# Patient Record
Sex: Male | Born: 1935 | Race: White | Hispanic: No | Marital: Married | Smoking: Former smoker
Health system: Southern US, Community
[De-identification: ages and names within clinical notes are randomized; demographics above are authoritative.]

## PROBLEM LIST (undated history)

## (undated) DIAGNOSIS — I872 Venous insufficiency (chronic) (peripheral): Secondary | ICD-10-CM

## (undated) DIAGNOSIS — N10 Acute tubulo-interstitial nephritis: Principal | ICD-10-CM

## (undated) DIAGNOSIS — R972 Elevated prostate specific antigen [PSA]: Secondary | ICD-10-CM

## (undated) DIAGNOSIS — Z7901 Long term (current) use of anticoagulants: Secondary | ICD-10-CM

## (undated) DIAGNOSIS — E669 Obesity, unspecified: Secondary | ICD-10-CM

## (undated) DIAGNOSIS — I429 Cardiomyopathy, unspecified: Secondary | ICD-10-CM

## (undated) DIAGNOSIS — I82409 Acute embolism and thrombosis of unspecified deep veins of unspecified lower extremity: Secondary | ICD-10-CM

## (undated) DIAGNOSIS — B9620 Unspecified Escherichia coli [E. coli] as the cause of diseases classified elsewhere: Secondary | ICD-10-CM

## (undated) DIAGNOSIS — N400 Enlarged prostate without lower urinary tract symptoms: Secondary | ICD-10-CM

## (undated) DIAGNOSIS — W19XXXA Unspecified fall, initial encounter: Secondary | ICD-10-CM

## (undated) DIAGNOSIS — F0390 Unspecified dementia without behavioral disturbance: Secondary | ICD-10-CM

## (undated) DIAGNOSIS — F17200 Nicotine dependence, unspecified, uncomplicated: Secondary | ICD-10-CM

## (undated) DIAGNOSIS — R519 Headache, unspecified: Secondary | ICD-10-CM

## (undated) DIAGNOSIS — R4189 Other symptoms and signs involving cognitive functions and awareness: Secondary | ICD-10-CM

## (undated) DIAGNOSIS — J9801 Acute bronchospasm: Secondary | ICD-10-CM

## (undated) DIAGNOSIS — I714 Abdominal aortic aneurysm, without rupture, unspecified: Secondary | ICD-10-CM

## (undated) DIAGNOSIS — N200 Calculus of kidney: Secondary | ICD-10-CM

## (undated) DIAGNOSIS — J449 Chronic obstructive pulmonary disease, unspecified: Secondary | ICD-10-CM

## (undated) DIAGNOSIS — I4891 Unspecified atrial fibrillation: Secondary | ICD-10-CM

## (undated) DIAGNOSIS — Y92009 Unspecified place in unspecified non-institutional (private) residence as the place of occurrence of the external cause: Secondary | ICD-10-CM

## (undated) DIAGNOSIS — M1990 Unspecified osteoarthritis, unspecified site: Secondary | ICD-10-CM

## (undated) DIAGNOSIS — N210 Calculus in bladder: Secondary | ICD-10-CM

## (undated) DIAGNOSIS — R51 Headache: Secondary | ICD-10-CM

## (undated) DIAGNOSIS — L0390 Cellulitis, unspecified: Secondary | ICD-10-CM

## (undated) DIAGNOSIS — Z5181 Encounter for therapeutic drug level monitoring: Secondary | ICD-10-CM

## (undated) DIAGNOSIS — R609 Edema, unspecified: Secondary | ICD-10-CM

## (undated) DIAGNOSIS — H409 Unspecified glaucoma: Secondary | ICD-10-CM

## (undated) DIAGNOSIS — IMO0002 Reserved for concepts with insufficient information to code with codable children: Secondary | ICD-10-CM

## (undated) DIAGNOSIS — I509 Heart failure, unspecified: Secondary | ICD-10-CM

## (undated) DIAGNOSIS — R531 Weakness: Secondary | ICD-10-CM

## (undated) DIAGNOSIS — I739 Peripheral vascular disease, unspecified: Secondary | ICD-10-CM

## (undated) DIAGNOSIS — N401 Enlarged prostate with lower urinary tract symptoms: Secondary | ICD-10-CM

## (undated) DIAGNOSIS — F068 Other specified mental disorders due to known physiological condition: Secondary | ICD-10-CM

## (undated) DIAGNOSIS — N390 Urinary tract infection, site not specified: Secondary | ICD-10-CM

## (undated) DIAGNOSIS — E538 Deficiency of other specified B group vitamins: Secondary | ICD-10-CM

## (undated) HISTORY — PX: CATARACT EXTRACTION W/ INTRAOCULAR LENS  IMPLANT, BILATERAL: SHX1307

## (undated) HISTORY — DX: Cardiomyopathy, unspecified: I42.9

## (undated) HISTORY — DX: Acute embolism and thrombosis of unspecified deep veins of unspecified lower extremity: I82.409

## (undated) HISTORY — DX: Nicotine dependence, unspecified, uncomplicated: F17.200

## (undated) HISTORY — DX: Unspecified place in unspecified non-institutional (private) residence as the place of occurrence of the external cause: Y92.009

## (undated) HISTORY — PX: SPINE SURGERY: SHX786

## (undated) HISTORY — DX: Abdominal aortic aneurysm, without rupture: I71.4

## (undated) HISTORY — DX: Benign prostatic hyperplasia without lower urinary tract symptoms: N40.0

## (undated) HISTORY — DX: Deficiency of other specified B group vitamins: E53.8

## (undated) HISTORY — DX: Heart failure, unspecified: I50.9

## (undated) HISTORY — DX: Venous insufficiency (chronic) (peripheral): I87.2

## (undated) HISTORY — DX: Acute bronchospasm: J98.01

## (undated) HISTORY — DX: Acute pyelonephritis: N10

## (undated) HISTORY — DX: Abdominal aortic aneurysm, without rupture, unspecified: I71.40

## (undated) HISTORY — PX: BACK SURGERY: SHX140

## (undated) HISTORY — DX: Obesity, unspecified: E66.9

## (undated) HISTORY — DX: Elevated prostate specific antigen (PSA): R97.20

## (undated) HISTORY — DX: Other specified mental disorders due to known physiological condition: F06.8

## (undated) HISTORY — DX: Reserved for concepts with insufficient information to code with codable children: IMO0002

## (undated) HISTORY — DX: Edema, unspecified: R60.9

## (undated) HISTORY — DX: Chronic obstructive pulmonary disease, unspecified: J44.9

## (undated) HISTORY — DX: Long term (current) use of anticoagulants: Z79.01

## (undated) HISTORY — DX: Unspecified atrial fibrillation: I48.91

## (undated) HISTORY — DX: Encounter for therapeutic drug level monitoring: Z51.81

## (undated) HISTORY — DX: Unspecified fall, initial encounter: W19.XXXA

## (undated) HISTORY — DX: Weakness: R53.1

## (undated) HISTORY — PX: EYE SURGERY: SHX253

## (undated) HISTORY — DX: Cellulitis, unspecified: L03.90

## (undated) HISTORY — DX: Calculus in bladder: N21.0

## (undated) HISTORY — DX: Peripheral vascular disease, unspecified: I73.9

---

## 1969-01-30 HISTORY — PX: CYSTOSCOPY W/ STONE MANIPULATION: SHX1427

## 1984-06-01 HISTORY — PX: INSITU PERCUTANEOUS PINNING FEMUR: SUR727

## 1985-04-01 HISTORY — PX: FEMUR FRACTURE SURGERY: SHX633

## 1998-04-19 ENCOUNTER — Other Ambulatory Visit: Admission: RE | Admit: 1998-04-19 | Discharge: 1998-04-19 | Payer: Self-pay | Admitting: Urology

## 2000-03-09 ENCOUNTER — Encounter: Payer: Self-pay | Admitting: Family Medicine

## 2000-03-09 ENCOUNTER — Ambulatory Visit (HOSPITAL_COMMUNITY): Admission: RE | Admit: 2000-03-09 | Discharge: 2000-03-09 | Payer: Self-pay | Admitting: Family Medicine

## 2000-03-17 ENCOUNTER — Encounter: Admission: RE | Admit: 2000-03-17 | Discharge: 2000-04-06 | Payer: Self-pay | Admitting: Neurosurgery

## 2002-01-18 ENCOUNTER — Ambulatory Visit (HOSPITAL_COMMUNITY): Admission: RE | Admit: 2002-01-18 | Discharge: 2002-01-18 | Payer: Self-pay | Admitting: Neurosurgery

## 2002-01-18 ENCOUNTER — Encounter: Payer: Self-pay | Admitting: Neurosurgery

## 2002-06-01 HISTORY — PX: POSTERIOR LAMINECTOMY / DECOMPRESSION LUMBAR SPINE: SUR740

## 2002-11-17 ENCOUNTER — Encounter: Payer: Self-pay | Admitting: Neurosurgery

## 2002-11-17 ENCOUNTER — Ambulatory Visit (HOSPITAL_COMMUNITY): Admission: RE | Admit: 2002-11-17 | Discharge: 2002-11-18 | Payer: Self-pay | Admitting: Neurosurgery

## 2002-12-20 ENCOUNTER — Encounter: Admission: RE | Admit: 2002-12-20 | Discharge: 2003-01-24 | Payer: Self-pay | Admitting: Neurosurgery

## 2004-09-05 ENCOUNTER — Ambulatory Visit (HOSPITAL_COMMUNITY): Admission: RE | Admit: 2004-09-05 | Discharge: 2004-09-05 | Payer: Self-pay | Admitting: Family Medicine

## 2005-09-24 ENCOUNTER — Emergency Department (HOSPITAL_COMMUNITY): Admission: EM | Admit: 2005-09-24 | Discharge: 2005-09-24 | Payer: Self-pay | Admitting: Emergency Medicine

## 2006-11-01 ENCOUNTER — Ambulatory Visit (HOSPITAL_COMMUNITY): Admission: RE | Admit: 2006-11-01 | Discharge: 2006-11-01 | Payer: Self-pay | Admitting: Family Medicine

## 2007-05-25 ENCOUNTER — Encounter: Admission: RE | Admit: 2007-05-25 | Discharge: 2007-05-25 | Payer: Self-pay | Admitting: Urology

## 2007-06-02 HISTORY — PX: HIP FRACTURE SURGERY: SHX118

## 2007-06-24 ENCOUNTER — Ambulatory Visit: Payer: Self-pay | Admitting: Vascular Surgery

## 2008-05-28 ENCOUNTER — Inpatient Hospital Stay (HOSPITAL_COMMUNITY): Admission: EM | Admit: 2008-05-28 | Discharge: 2008-06-05 | Payer: Self-pay | Admitting: Emergency Medicine

## 2008-06-04 ENCOUNTER — Ambulatory Visit: Payer: Self-pay | Admitting: Physical Medicine & Rehabilitation

## 2008-06-05 ENCOUNTER — Inpatient Hospital Stay (HOSPITAL_COMMUNITY)
Admission: RE | Admit: 2008-06-05 | Discharge: 2008-06-15 | Payer: Self-pay | Admitting: Physical Medicine & Rehabilitation

## 2008-06-05 ENCOUNTER — Ambulatory Visit: Payer: Self-pay | Admitting: Physical Medicine & Rehabilitation

## 2008-07-27 ENCOUNTER — Ambulatory Visit: Payer: Self-pay | Admitting: Vascular Surgery

## 2008-08-13 ENCOUNTER — Encounter: Admission: RE | Admit: 2008-08-13 | Discharge: 2008-09-11 | Payer: Self-pay | Admitting: Orthopedic Surgery

## 2009-02-05 ENCOUNTER — Ambulatory Visit: Payer: Self-pay | Admitting: Gastroenterology

## 2009-02-08 ENCOUNTER — Ambulatory Visit: Payer: Self-pay | Admitting: Vascular Surgery

## 2009-02-21 ENCOUNTER — Telehealth: Payer: Self-pay | Admitting: Gastroenterology

## 2009-02-22 ENCOUNTER — Telehealth: Payer: Self-pay | Admitting: Gastroenterology

## 2009-02-25 ENCOUNTER — Encounter: Payer: Self-pay | Admitting: Gastroenterology

## 2009-02-25 ENCOUNTER — Ambulatory Visit: Payer: Self-pay | Admitting: Gastroenterology

## 2009-02-26 ENCOUNTER — Encounter: Payer: Self-pay | Admitting: Gastroenterology

## 2009-06-01 HISTORY — PX: CHOLECYSTECTOMY: SHX55

## 2009-07-12 ENCOUNTER — Ambulatory Visit: Payer: Self-pay | Admitting: Vascular Surgery

## 2009-07-19 ENCOUNTER — Ambulatory Visit: Payer: Self-pay | Admitting: Vascular Surgery

## 2009-12-07 ENCOUNTER — Inpatient Hospital Stay (HOSPITAL_COMMUNITY): Admission: EM | Admit: 2009-12-07 | Discharge: 2009-12-12 | Payer: Self-pay | Admitting: Emergency Medicine

## 2009-12-09 ENCOUNTER — Encounter (INDEPENDENT_AMBULATORY_CARE_PROVIDER_SITE_OTHER): Payer: Self-pay

## 2009-12-30 ENCOUNTER — Inpatient Hospital Stay (HOSPITAL_COMMUNITY): Admission: EM | Admit: 2009-12-30 | Discharge: 2010-01-02 | Payer: Self-pay | Admitting: Emergency Medicine

## 2009-12-31 ENCOUNTER — Encounter (INDEPENDENT_AMBULATORY_CARE_PROVIDER_SITE_OTHER): Payer: Self-pay

## 2010-02-20 ENCOUNTER — Ambulatory Visit (HOSPITAL_COMMUNITY): Admission: RE | Admit: 2010-02-20 | Discharge: 2010-02-20 | Payer: Self-pay | Admitting: Gastroenterology

## 2010-08-14 LAB — PROTIME-INR
INR: 1.06 (ref 0.00–1.49)
Prothrombin Time: 14 seconds (ref 11.6–15.2)

## 2010-08-14 LAB — COMPREHENSIVE METABOLIC PANEL
ALT: 18 U/L (ref 0–53)
Alkaline Phosphatase: 63 U/L (ref 39–117)
BUN: 13 mg/dL (ref 6–23)
CO2: 29 mEq/L (ref 19–32)
Chloride: 106 mEq/L (ref 96–112)
Creatinine, Ser: 0.86 mg/dL (ref 0.4–1.5)
GFR calc non Af Amer: >60 mL/min (ref >60)
Glucose, Bld: 95 mg/dL (ref 70–99)
Total Protein: 6.6 g/dL (ref 6.0–8.3)

## 2010-08-14 LAB — CBC
MCHC: 33.8 g/dL (ref 30.0–36.0)
Platelets: 188 10*3/uL (ref 150–400)

## 2010-08-14 LAB — DIFFERENTIAL
Eosinophils Absolute: 0.3 10*3/uL (ref 0.0–0.7)
Eosinophils Relative: 6 % — ABNORMAL HIGH (ref 0–5)
Lymphs Abs: 2.1 10*3/uL (ref 0.7–4.0)
Neutro Abs: 3 10*3/uL (ref 1.7–7.7)
Neutrophils Relative %: 47 % (ref 43–77)

## 2010-08-14 LAB — TYPE AND SCREEN: Antibody Screen: NEGATIVE

## 2010-08-15 LAB — CBC
HCT: 38.8 % — ABNORMAL LOW (ref 39.0–52.0)
HCT: 44.8 % (ref 39.0–52.0)
Hemoglobin: 12.4 g/dL — ABNORMAL LOW (ref 13.0–17.0)
Hemoglobin: 15.3 g/dL (ref 13.0–17.0)
MCH: 30 pg (ref 26.0–34.0)
MCH: 30 pg (ref 26.0–34.0)
MCH: 31 pg (ref 26.0–34.0)
MCHC: 33.5 g/dL (ref 30.0–36.0)
MCV: 89.4 fL (ref 78.0–100.0)
MCV: 90.2 fL (ref 78.0–100.0)
MCV: 90.4 fL (ref 78.0–100.0)
MCV: 90.7 fL (ref 78.0–100.0)
Platelets: 171 10*3/uL (ref 150–400)
Platelets: 188 10*3/uL (ref 150–400)
Platelets: 207 10*3/uL (ref 150–400)
RBC: 4.15 MIL/uL — ABNORMAL LOW (ref 4.22–5.81)
RDW: 14.9 % (ref 11.5–15.5)
RDW: 15.2 % (ref 11.5–15.5)
RDW: 15.5 % (ref 11.5–15.5)
WBC: 10 10*3/uL (ref 4.0–10.5)
WBC: 16.5 10*3/uL — ABNORMAL HIGH (ref 4.0–10.5)

## 2010-08-15 LAB — COMPREHENSIVE METABOLIC PANEL
ALT: 283 U/L — ABNORMAL HIGH (ref 0–53)
AST: 283 U/L — ABNORMAL HIGH (ref 0–37)
AST: 81 U/L — ABNORMAL HIGH (ref 0–37)
Albumin: 2.6 g/dL — ABNORMAL LOW (ref 3.5–5.2)
Albumin: 2.6 g/dL — ABNORMAL LOW (ref 3.5–5.2)
Albumin: 3.5 g/dL (ref 3.5–5.2)
Alkaline Phosphatase: 185 U/L — ABNORMAL HIGH (ref 39–117)
Alkaline Phosphatase: 225 U/L — ABNORMAL HIGH (ref 39–117)
BUN: 12 mg/dL (ref 6–23)
BUN: 12 mg/dL (ref 6–23)
BUN: 8 mg/dL (ref 6–23)
CO2: 26 mEq/L (ref 19–32)
CO2: 26 mEq/L (ref 19–32)
Calcium: 8.7 mg/dL (ref 8.4–10.5)
Calcium: 9.5 mg/dL (ref 8.4–10.5)
Chloride: 105 mEq/L (ref 96–112)
Chloride: 108 mEq/L (ref 96–112)
Creatinine, Ser: 0.8 mg/dL (ref 0.4–1.5)
Creatinine, Ser: 0.95 mg/dL (ref 0.4–1.5)
GFR calc Af Amer: >60 mL/min (ref >60)
GFR calc Af Amer: >60 mL/min (ref >60)
GFR calc non Af Amer: >60 mL/min (ref >60)
Glucose, Bld: 121 mg/dL — ABNORMAL HIGH (ref 70–99)
Glucose, Bld: 94 mg/dL (ref 70–99)
Potassium: 3.9 mEq/L (ref 3.5–5.1)
Potassium: 4 mEq/L (ref 3.5–5.1)
Potassium: 4.5 mEq/L (ref 3.5–5.1)
Sodium: 138 mEq/L (ref 135–145)
Total Bilirubin: 7.2 mg/dL — ABNORMAL HIGH (ref 0.3–1.2)
Total Bilirubin: 7.3 mg/dL — ABNORMAL HIGH (ref 0.3–1.2)
Total Bilirubin: 8.7 mg/dL — ABNORMAL HIGH (ref 0.3–1.2)
Total Bilirubin: 8.8 mg/dL — ABNORMAL HIGH (ref 0.3–1.2)
Total Protein: 5.7 g/dL — ABNORMAL LOW (ref 6.0–8.3)
Total Protein: 5.7 g/dL — ABNORMAL LOW (ref 6.0–8.3)
Total Protein: 7 g/dL (ref 6.0–8.3)

## 2010-08-15 LAB — URINALYSIS, ROUTINE W REFLEX MICROSCOPIC
Hgb urine dipstick: NEGATIVE
Ketones, ur: 15 mg/dL — AB
Nitrite: POSITIVE — AB
Specific Gravity, Urine: 1.022 (ref 1.005–1.030)
pH: 6 (ref 5.0–8.0)

## 2010-08-15 LAB — POCT CARDIAC MARKERS: Myoglobin, poc: 93.3 ng/mL (ref 12–200)

## 2010-08-15 LAB — LIPASE, BLOOD: Lipase: 17 U/L (ref 11–59)

## 2010-08-15 LAB — POCT I-STAT, CHEM 8
Calcium, Ion: 1.12 mmol/L (ref 1.12–1.32)
HCT: 45 % (ref 39.0–52.0)
Hemoglobin: 15.3 g/dL (ref 13.0–17.0)
Sodium: 138 mEq/L (ref 135–145)
TCO2: 22 mmol/L (ref 0–100)

## 2010-08-15 LAB — URINE MICROSCOPIC-ADD ON

## 2010-08-15 LAB — DIFFERENTIAL
Eosinophils Absolute: 0.1 10*3/uL (ref 0.0–0.7)
Monocytes Absolute: 2.4 10*3/uL — ABNORMAL HIGH (ref 0.1–1.0)

## 2010-08-17 LAB — CBC
HCT: 38.9 % — ABNORMAL LOW (ref 39.0–52.0)
HCT: 42.3 % (ref 39.0–52.0)
Hemoglobin: 12.8 g/dL — ABNORMAL LOW (ref 13.0–17.0)
Hemoglobin: 12.8 g/dL — ABNORMAL LOW (ref 13.0–17.0)
Hemoglobin: 13.4 g/dL (ref 13.0–17.0)
Hemoglobin: 14.3 g/dL (ref 13.0–17.0)
MCH: 30.8 pg (ref 26.0–34.0)
MCH: 31.1 pg (ref 26.0–34.0)
MCH: 31.4 pg (ref 26.0–34.0)
MCHC: 33 g/dL (ref 30.0–36.0)
MCHC: 33.1 g/dL (ref 30.0–36.0)
MCHC: 33.7 g/dL (ref 30.0–36.0)
MCHC: 33.8 g/dL (ref 30.0–36.0)
MCV: 92.3 fL (ref 78.0–100.0)
MCV: 92.5 fL (ref 78.0–100.0)
MCV: 93.3 fL (ref 78.0–100.0)
Platelets: 153 10*3/uL (ref 150–400)
Platelets: 194 10*3/uL (ref 150–400)
RBC: 4.06 MIL/uL — ABNORMAL LOW (ref 4.22–5.81)
RBC: 4.12 MIL/uL — ABNORMAL LOW (ref 4.22–5.81)
RBC: 4.27 MIL/uL (ref 4.22–5.81)
RDW: 15.7 % — ABNORMAL HIGH (ref 11.5–15.5)
RDW: 15.7 % — ABNORMAL HIGH (ref 11.5–15.5)

## 2010-08-17 LAB — COMPREHENSIVE METABOLIC PANEL
ALT: 201 U/L — ABNORMAL HIGH (ref 0–53)
ALT: 98 U/L — ABNORMAL HIGH (ref 0–53)
AST: 159 U/L — ABNORMAL HIGH (ref 0–37)
AST: 335 U/L — ABNORMAL HIGH (ref 0–37)
AST: 81 U/L — ABNORMAL HIGH (ref 0–37)
Albumin: 2.7 g/dL — ABNORMAL LOW (ref 3.5–5.2)
Albumin: 2.8 g/dL — ABNORMAL LOW (ref 3.5–5.2)
Alkaline Phosphatase: 143 U/L — ABNORMAL HIGH (ref 39–117)
Alkaline Phosphatase: 155 U/L — ABNORMAL HIGH (ref 39–117)
BUN: 12 mg/dL (ref 6–23)
BUN: 7 mg/dL (ref 6–23)
BUN: 9 mg/dL (ref 6–23)
BUN: 9 mg/dL (ref 6–23)
CO2: 25 mEq/L (ref 19–32)
CO2: 26 mEq/L (ref 19–32)
CO2: 28 mEq/L (ref 19–32)
CO2: 29 mEq/L (ref 19–32)
Calcium: 8.4 mg/dL (ref 8.4–10.5)
Calcium: 8.6 mg/dL (ref 8.4–10.5)
Calcium: 8.7 mg/dL (ref 8.4–10.5)
Chloride: 107 mEq/L (ref 96–112)
Chloride: 110 mEq/L (ref 96–112)
Chloride: 110 mEq/L (ref 96–112)
Creatinine, Ser: 0.84 mg/dL (ref 0.4–1.5)
Creatinine, Ser: 0.96 mg/dL (ref 0.4–1.5)
Creatinine, Ser: 1 mg/dL (ref 0.4–1.5)
GFR calc Af Amer: >60 mL/min (ref >60)
GFR calc Af Amer: >60 mL/min (ref >60)
GFR calc non Af Amer: >60 mL/min (ref >60)
GFR calc non Af Amer: >60 mL/min (ref >60)
GFR calc non Af Amer: >60 mL/min (ref >60)
Glucose, Bld: 127 mg/dL — ABNORMAL HIGH (ref 70–99)
Glucose, Bld: 130 mg/dL — ABNORMAL HIGH (ref 70–99)
Potassium: 4.4 mEq/L (ref 3.5–5.1)
Sodium: 139 mEq/L (ref 135–145)
Sodium: 141 mEq/L (ref 135–145)
Total Bilirubin: 3.2 mg/dL — ABNORMAL HIGH (ref 0.3–1.2)
Total Bilirubin: 4.3 mg/dL — ABNORMAL HIGH (ref 0.3–1.2)
Total Protein: 5.5 g/dL — ABNORMAL LOW (ref 6.0–8.3)
Total Protein: 5.7 g/dL — ABNORMAL LOW (ref 6.0–8.3)
Total Protein: 5.7 g/dL — ABNORMAL LOW (ref 6.0–8.3)
Total Protein: 5.9 g/dL — ABNORMAL LOW (ref 6.0–8.3)

## 2010-08-17 LAB — DIFFERENTIAL
Basophils Relative: 0 % (ref 0–1)
Lymphocytes Relative: 12 % (ref 12–46)
Monocytes Absolute: 0.6 10*3/uL (ref 0.1–1.0)
Monocytes Relative: 7 % (ref 3–12)
Neutro Abs: 6.8 10*3/uL (ref 1.7–7.7)

## 2010-08-17 LAB — TYPE AND SCREEN
ABO/RH(D): O POS
Antibody Screen: NEGATIVE

## 2010-08-17 LAB — HEPATIC FUNCTION PANEL
ALT: 16 U/L (ref 0–53)
AST: 24 U/L (ref 0–37)
Bilirubin, Direct: 0.2 mg/dL (ref 0.0–0.3)
Total Bilirubin: 0.6 mg/dL (ref 0.3–1.2)

## 2010-08-17 LAB — URINALYSIS, ROUTINE W REFLEX MICROSCOPIC
Bilirubin Urine: NEGATIVE
Glucose, UA: NEGATIVE mg/dL
Hgb urine dipstick: NEGATIVE
Ketones, ur: NEGATIVE mg/dL
pH: 8.5 — ABNORMAL HIGH (ref 5.0–8.0)

## 2010-08-17 LAB — BASIC METABOLIC PANEL
Chloride: 106 mEq/L (ref 96–112)
GFR calc non Af Amer: >60 mL/min (ref >60)
Glucose, Bld: 128 mg/dL — ABNORMAL HIGH (ref 70–99)
Potassium: 4.3 mEq/L (ref 3.5–5.1)
Sodium: 140 mEq/L (ref 135–145)

## 2010-09-15 LAB — CBC
HCT: 32.3 % — ABNORMAL LOW (ref 39.0–52.0)
Hemoglobin: 11 g/dL — ABNORMAL LOW (ref 13.0–17.0)
MCHC: 33.6 g/dL (ref 30.0–36.0)
MCHC: 33.9 g/dL (ref 30.0–36.0)
MCV: 92.2 fL (ref 78.0–100.0)
Platelets: 369 10*3/uL (ref 150–400)
RBC: 3.35 MIL/uL — ABNORMAL LOW (ref 4.22–5.81)
RBC: 3.29 MIL/uL — ABNORMAL LOW (ref 4.22–5.81)
RBC: 3.52 MIL/uL — ABNORMAL LOW (ref 4.22–5.81)
WBC: 7.3 10*3/uL (ref 4.0–10.5)
WBC: 8.3 10*3/uL (ref 4.0–10.5)

## 2010-09-15 LAB — URINALYSIS, ROUTINE W REFLEX MICROSCOPIC
Bilirubin Urine: NEGATIVE
Glucose, UA: NEGATIVE mg/dL
Hgb urine dipstick: NEGATIVE
Ketones, ur: NEGATIVE mg/dL
Protein, ur: NEGATIVE mg/dL
pH: 6 (ref 5.0–8.0)

## 2010-09-15 LAB — URINE CULTURE: Colony Count: 9000

## 2010-09-15 LAB — BASIC METABOLIC PANEL
CO2: 25 mEq/L (ref 19–32)
Calcium: 8.7 mg/dL (ref 8.4–10.5)
Chloride: 108 mEq/L (ref 96–112)
GFR calc Af Amer: >60 mL/min (ref >60)
Potassium: 4 mEq/L (ref 3.5–5.1)
Sodium: 141 mEq/L (ref 135–145)

## 2010-09-15 LAB — DIFFERENTIAL
Eosinophils Absolute: 0.6 10*3/uL (ref 0.0–0.7)
Eosinophils Relative: 8 % — ABNORMAL HIGH (ref 0–5)
Lymphocytes Relative: 18 % (ref 12–46)
Lymphs Abs: 1.3 10*3/uL (ref 0.7–4.0)
Monocytes Absolute: 1 10*3/uL (ref 0.1–1.0)

## 2010-09-15 LAB — COMPREHENSIVE METABOLIC PANEL
ALT: 48 U/L (ref 0–53)
AST: 44 U/L — ABNORMAL HIGH (ref 0–37)
Albumin: 2.5 g/dL — ABNORMAL LOW (ref 3.5–5.2)
CO2: 26 mEq/L (ref 19–32)
Calcium: 8.7 mg/dL (ref 8.4–10.5)
Chloride: 109 mEq/L (ref 96–112)
Creatinine, Ser: 0.91 mg/dL (ref 0.4–1.5)
GFR calc Af Amer: >60 mL/min (ref >60)
Sodium: 140 mEq/L (ref 135–145)
Total Bilirubin: 0.8 mg/dL (ref 0.3–1.2)

## 2010-10-14 NOTE — Consult Note (Signed)
[name], [name]                  ACCOUNT NO.:  [id number]   MEDICAL RECORD NO.:  [id number]          PATIENT TYPE:  INP   LOCATION:  0111                         FACILITY:  [facility]   PHYSICIAN:  Courtney Paris, M.D.DATE OF BIRTH:  [birth year]   DATE OF CONSULTATION:  05/28/2008  DATE OF DISCHARGE:                                 CONSULTATION   REASON FOR CONSULTATION:  Inability to pass Foley catheter, called by  emergency room physician.   BRIEF HISTORY:  This 77 year old patient who has previously seen Dr.  Annabell Howells had a fractured right hip today.  He had a mechanical fall.  He  had previously injured and broken his right femur around 1986.  The  catheter was originally placed by the emergency room personnel but did  not drain, later removed after about 3 hours.  He did have some gross  hematuria then was unable to void.  He has been on doxazosin and  finasteride, sees Dr. Annabell Howells, last saw him in October.  He previously had  a biopsy of the prostate by Dr. Vonita Moss.  He has never been in urinary  retention before.   OTHER MEDICAL PROBLEMS:  1. He has an abdominal aortic aneurysm that is 4.7 cm.  2. He does have some coronary artery disease.  3. Hypertension.  4. Diabetes mellitus.  5. He does have some mild asthma.   PREVIOUS SURGERIES:  Include:  1. Back surgery x2.  2. His broken femur years ago.   HE HAS NO ALLERGIES.   He is on doxazosin and the finasteride as above.   VITAL SIGNS:  Stable.  His blood pressure 132/68.  Heart rate 64.  Respirations 16.  He is a pleasant elderly white male laying in bed in  some mild distress from his broken hip.  HEENT:  Clear.  NECK:  Supple.  ABDOMEN:  Soft.  Mildly obese.  Bladder is not distended.  PENIS:  Normal, circumcised.  Adequate meatus.  Bilaterally descended  testes.  Prostate hard to feel due to his fractured hip and not  examined.  EXTREMITIES:  He has little lateral rotation of his hip from his recent  hip  fracture.  Good distal pulses and agitation to light touch.   A #16-Foley catheter was passed with lidocaine jelly into the bladder  without difficulty.  He did have light pink urine with about 400 mL.  Catheter was left to straight drainage with 10 mL in the balloon.   IMPRESSION:  1. Urethral trauma secondary to malplacement of Foley catheter.  2. Recent fracture, right hip.  3. Obstructive uropathy on maximal medical therapy.   RECOMMEND:  Leave Foley catheter until he is fully ambulatory.  Continue  doxazosin and finasteride.  You might want to hold doxazosin pre and  postop per the preop evaluation but continue finasteride when he is  ambulatory then discontinue the Foley for a voiding trial.  He had been  voiding well prior to this and should do so afterwards.  If he has  difficulty, please call Dr. Annabell Howells who is the patient's urologist  for any  problems.      Courtney Paris, M.D.  Electronically Signed     HMK/MEDQ  D:  05/28/2008  T:  05/29/2008  Job:  [id number]

## 2010-10-14 NOTE — Op Note (Signed)
[name], [name]                  ACCOUNT NO.:  [id number]   MEDICAL RECORD NO.:  [id number]          PATIENT TYPE:  INP   LOCATION:  1609                         FACILITY:  [facility]   PHYSICIAN:  John L. Rendall, M.D.  DATE OF BIRTH:  [birth year]   DATE OF PROCEDURE:  05/29/2008  DATE OF DISCHARGE:                               OPERATIVE REPORT   PREOPERATIVE DIAGNOSIS:  Two-part intertrochanteric fracture, right hip.   SURGICAL PROCEDURE:  Dynamic compression screw fixation, right hip  fracture, TK2.   POSTOPERATIVE DIAGNOSIS:  Two-part intertrochanteric fracture, right  hip.   SURGEON:  John L. Rendall, M.D.   ASSISTANT:  Legrand Pitts. Duffy, PA-C.   ANESTHESIA:  General.   PATHOLOGY:  Two-part intertroc fracture, right hip, with minimal  displacement secondary to a fall.   PROCEDURE:  Under general anesthetic, the patient was placed on the  fracture table and his hip was prepared with DuraPrep and draped as a  sterile field.  He is 300 pounds at 6 feet 8 and quite a large fellow.  After 2 g of Ancef and usual time-out procedure, a lateral skin incision  was made beginning just below the greater trochanter.  Dissection was  carried down through skin and subcutaneous tissue, splitting the IT band  and the vastus lateralis vertically.  Multiple bleeding vessels were  encountered in the subcutaneous tissue and these tended to be quite  large.  Electrocautery was freely used.  At the cortex a guidewire was  placed up the femoral neck into the femoral head at an approximate 135-  degree angle.  After several adjustments, it was beautifully centered in  the inferior one third of the femoral head on both the AP and lateral  view, pretty much in the midline on the lateral view.  With this in  place, it was measured to be 110 mm deep.  A 4-hole 135-degree side  plate was measured to see how close it would be to the plate and screws  in the distal two-thirds of the femur, and there was an  approximate 3-  inch gap that would be present.  This was felt acceptable, and it should  be noted that the proximal screw on the 4-hole plate came out into this  intertroc fracture.  The 110-mm screw was placed into the femoral neck  and head and the 4-hole plate attached to the side of the shaft with a  [place] claw retractor.  With this in place, 4 screws were placed  laterally.  Excellent bite was obtained in these screws, all except for  the first one which was only a unicortical screw.  At this point with  the plate in place and in excellent position on 2 views, traction was  let off, fracture was compressed and is literally seen to compress  approximately 5-6 mm.  This apparatus stayed in place properly.  With  this then complete, final x-  rays were obtained.  The wound was irrigated and then closed in layers  with #1 Vicryl, 0 Vicryl, 2-0 Vicryl and skin clips.  Operative  time  approximately 1 hour and 10 minutes.  The patient tolerated the  procedure well.  Blood loss estimate 750 mL, none given.  He returned to  recovery in good condition.      John L. Rendall, M.D.  Electronically Signed     JLR/MEDQ  D:  05/29/2008  T:  05/30/2008  Job:  [id number]

## 2010-10-14 NOTE — Assessment & Plan Note (Signed)
OFFICE VISIT   [name], [name]  DOB:  [birth year]                                       07/19/2009  QIHKV#:[id number]   The patient is Mays 77 year old Caucasian male who is here today regarding  concern of right leg pain and bilateral lower extremity edema.  He has  known chronic venous insufficiency as well as Mays small infrarenal  abdominal aortic aneurysm which Dr. Tawanna Cooler Early has been following.  He  apparently had an appointment at our office last week in which he  underwent abdominal ultrasound but did not wish to see Mays [name] at  that time and therefore rescheduled his visit for today.  He is here  today mainly for concern of his legs.  He has had no abdominal pain or  back pain in regards to his aneurysm.  In regards to his leg pain he  says that his right shin hurts when he walks and this has been going on  for about 1 year.  He reports that many of his symptoms in his legs  started after undergoing physical therapy following his right hip  fracture in December of 2009.  He said while in therapy it was  determined that Mays right leg plate which had been placed in 1986  following Mays fracture was also broken and now feels that his right leg is  crooked.  He has been seen for this and apparently was told that  surgery to repair and remove the plate would be too risky at such Mays far  out date from his initial surgery.  He had had Mays right venous ulcer  which was being treated by Dr. Elana Alm, his primary physician.  He was  using an Radio broadcast assistant. That venous ulcer has since healed.  Because of his  marked lower extremity swelling bilaterally Dr. Arbie Cookey did recommend  compression garments but the patient said these were too difficult for  him to wear and instead temporarily used some Ace wraps which provided  some relief.  Dr. Elana Alm also has since placed him on diuretic therapy.  He had not had as much of Mays problem with his left leg until last year  and is somewhat  concerned about this as well.  He has had no open sores  although did develop an eschar on his left leg.  He reports both lower  legs are very scaly and one of these came off while bathing and he later  developed this eschar.  It has not been draining.  He has not had any  fever.  He does have classic brawny edema on his lower legs and says his  leg discoloration has been chronic.  He does have Mays history of prior  right leg DVT and pulmonary embolism after his leg fracture in 1986 but  is no longer on Coumadin.  He denies any calf pain.   He underwent vascular lab studies at our office which showed biphasic  and triphasic Doppler waveforms bilaterally of his posterior tib and  dorsalis pedis arteries.  ABIs were greater than 1.0 bilaterally.   PHYSICAL EXAM:  Vital signs:  Findings show Mays blood pressure of 138/75  in his left arm, heart rate 66, respirations 20.  General:  This is Mays  tall gentleman, pleasant, in no acute distress.  HEENT:  Head is  normocephalic, atraumatic.  No carotid bruits were auscultated.  Lung:  Sounds are clear throughout.  Heart:  Had Mays regular rate and rhythm.  He  does have Mays 2/6 systolic ejection murmur heard best over his right  sternal border.  Abdomen:  Is soft and nontender.  I was unable to  palpate his abdominal aortic aneurysm.  Lower extremities:  Show 1+  femoral pulses bilaterally.  Doppler signals as discussed above.  He has  around 2+ edema bilateral lower extremities.  Lower extremities are  thick in appearance.  He has scaling of both lower extremities with the  scaling on his right shin somewhat tan in color.  He has some calluses  on his feet but no open sores.  He has the left mid lower leg eschar  approximately 1-2 cm in diameter which does not have any significant  erythema or drainage.   I reviewed these findings with Dr. Arbie Cookey.  It appears that these changes  are consistent with chronic venous insufficiency of bilateral lower   extremities which he has had.  I am recommending continued elevation as  able and to restart Ace wraps for relief.  Currently he is on furosemide  10 mg daily.  He can continue followup with Dr. Elana Alm regarding  diuretic therapy.  In regards to his aneurysm I did review the results  from his ultrasound on 07/12/2009.  The aneurysm measured 3.19 x 3.24 cm  compared to February of 2010 of 3.32 x 3.87 cm.  We have recommended 6  month followup regarding his aortic aneurysm.  In regards to the chronic  venous insufficiency continue with the plan as stated above.  He can  follow up as needed if develops any venous stasis ulcers.   Jerold Coombe, PA   Larina Earthly, M.D.  Electronically Signed   AWZ/MEDQ  D:  07/19/2009  T:  07/20/2009  Job:  [id number]   cc:   S. Kyra Manges, M.D.  Ernestina Penna, M.D.

## 2010-10-14 NOTE — Discharge Summary (Signed)
NAMESAAHAS, HIDROGO                  ACCOUNT NO.:  [id number]   MEDICAL RECORD NO.:  [id number]          PATIENT TYPE:  IPS   LOCATION:  4008                         FACILITY:  [facility]   PHYSICIAN:  Ranelle Oyster, M.D.DATE OF BIRTH:  May 02, 1932   DATE OF ADMISSION:  06/05/2008  DATE OF DISCHARGE:  06/15/2008                               DISCHARGE SUMMARY   DISCHARGE DIAGNOSES:  1. Right intertrochanteric hip fracture, status post dynamic      compression screw on May 30, 2007.  2. Benign prostatic hypertrophy.  3. Glaucoma.  4. Pain management.  5. Subcutaneous Lovenox for deep vein thrombosis prophylaxis.   This is a 77 year old white male admitted on May 28, 2008, after he  tripped over a dog chain.  There was no report of loss of consciousness.  Sustained a right intertrochanteric hip fracture.  Underwent dynamic  compression screw fixation on May 29, 2008, per Dr. Priscille Kluver.  Arixtra for deep vein thrombosis prophylaxis was added to his regimen.  Advised weightbearing as tolerated to right lower extremity.  Postoperative mild hematuria felt to be related to Foley tube trauma  with followup per Urology Services and monitored.  Pain control with PCA  discontinued on May 31, 2008.  Bouts of constipation with stool  softener added.  The patient was admitted for comprehensive rehab  program.   PAST MEDICAL HISTORY:  See discharge diagnoses.   Remote smoker.  Occasional alcohol.   Allergies are PROPOXYPHENE and NAPSYLATE.   SOCIAL HISTORY:  Lives with his wife.  Local daughter works.  Wife can  assist but limited with lifting.  He lives in a one-level home, two  steps to entry.   FUNCTIONAL HISTORY:  Prior to admission, he was independent driving.   FUNCTIONAL STATUS:  Upon admission to rehab services, he was total  assist 40 feet with a walker, total assist transfers.   MEDICATIONS PRIOR TO ADMISSION:  Cardura 8 mg daily, Proscar are 5 mg  daily, Cosopt  ophthalmic solution at bedtime.   PHYSICAL EXAMINATION:  VITAL SIGNS:  Blood pressure 120/66, pulse 88,  respiration 17, temperature 98.5.  GENERAL:  This was an alert male in no acute distress, oriented x3.  EXTREMITIES:  Deep tendon reflexes were 2+.  Calves remained cool  without any swelling, erythema, and nontender.  Right hip incision was  clean, dry, and intact with staples.  LUNGS:  Clear to auscultation.  CARDIAC:  Regular rate and rhythm.  ABDOMEN:  Soft and nontender.  Good bowel sounds.   REHABILITATION HOSPITAL COURSE:  The patient was admitted to inpatient  rehab services with therapies initiated on a 3-hour daily basis  consisting of physical therapy, occupational therapy, and 24-hour  rehabilitation nursing.  The following issues were addressed during the  patient's rehabilitation stay.  Pertaining to Mr. Mccarney right  intertrochanteric hip fracture, he had undergone compression screw on  May 29, 2008.  He was ambulating and weightbearing as tolerated.  He would follow up with Dr. Priscille Kluver 2 weeks after discharge.  He was  using Vicodin as needed for pain with  good results.  He did have a  history of benign prostatic hypertrophy with bouts of hematuria that  improved with removal of Foley catheter tube, felt to be related to  tube.  He remained on Proscar and Cardura.  He did have some mildly  elevated bladder scans after voids of 500.  He was placed on low-dose  Urecholine and monitored.  He would follow up with Urology Services on  discharge.  He remained on his Cosopt for history of glaucoma, which was  without issue during his rehab stay.  Bouts of constipation with  laxative assistance.  He did receive a bottle of magnesium citrate on  June 14, 2008.  The patient initially on Arixtra for deep vein  thrombosis prophylaxis.  Upon his rehab course, he completed this  regimen and Lovenox was added to his mobility improved.   Weekly collaborative  interdisciplinary team conferences were held to  discuss the patient's estimated length of stay, family teaching, and any  barriers to discharge.  He was currently modified independent,  wheelchair on the unit, supervision for transfers bed to chair, minimal  assist for ambulation on stairs, car, and bed mobility, supervision and  minimal assist overall for activities of daily living.  He exhibited no  unsafe behavior.  Full family teaching was completed.  He was discharged  to home.   Latest labs showed hemoglobin 10.4, hematocrit 30.9, and platelet  369,000.  Sodium 140, potassium 3.9, BUN 16, creatinine 0.9.   Discharge medications at the time of dictation included Cardura 4 mg at  bedtime; Proscar 5 mg daily; Cosopt 1 drop both eyes as advised; Senokot  tablets two at bedtime, hold for loose stools; Vicodin 5/325 mg one  tablet every 4 hours as needed pain, dispense of 90 tablets.   Diet was regular.  Weightbearing precautions are as tolerated.  He would  follow up with Dr. Priscille Kluver, Orthopedic Services in 1-2 weeks.  Call for  appointment Dr. Rudi Heap, medical management, Dr. Aldean Ast, Urology  Services as advised.      Mariam Dollar, P.A.      Ranelle Oyster, M.D.  Electronically Signed    DA/MEDQ  D:  06/14/2008  T:  06/15/2008  Job:  [id number]   cc:   Ernestina Penna, M.D.  John L. Rendall, M.D.  Excell Seltzer. Annabell Howells, M.D.

## 2010-10-14 NOTE — Assessment & Plan Note (Signed)
OFFICE VISIT   EMAURI, KRYGIER A  DOB:  [birth year]                                       06/24/2007  EAVWU#:[id number]   The patient presents today for evaluation of abdominal aortic aneurysm.  He had been seen in our practice before for venous pathology, and  incidental discussion of questionable aneurysm.  We had done an  ultrasound showing suggested maximal diameter of 2.7 cm.  He  subsequently has had other imaging studies, most recently an ultrasound  at [facility] on 05/25/2007 suggesting a maximal diameter of 4.7  cm.  I am seeing him for further discussion of this.   FAMILY HISTORY:  Significant for a brother with abdominal aortic  aneurysm as well.   Does not smoke, having quit 9 years ago.  He does have a history of  kidney stones.   PHYSICAL EXAM:  Well-developed, well-nourished gentleman appearing his  stated age of 40, blood pressure 149/75, pulse 73, respirations 18.  He  is quite large with a height of 6 feet 7 inches, weight 330 pounds.  Large gentleman in no acute distress.  He has 2+ radial, 2+ femoral, 2+  popliteal, and 2+ posterior tibial pulses bilaterally.  Abdominal exam  reveals moderate obesity.  I do not palpate an aneurysm.  He did have a  CT scan today, and I have compared this with CT scan of April of 2007.  He has diffuse arteriomegaly throughout his thoracic and abdominal  aorta.  Maximal diameter in the abdomen is 3.8 cm.  Maximal diameter in  the chest is approximately 3.5 cm.   I discussed this at length with the patient and his wife present.  I  explained that, with his size, that he must present some difficult with  ultrasound.  I think that we clearly underestimated the size of his  aneurysm with his ultrasound in our office November 2007 when he had a  CT scan showing a maximal diameter in April of 2007 of 3.6 cm.  Also, I  explained that the recent imaging at [facility] suggesting a 4.7  cm aneurysm,  obviously was incorrect with a CAT scan today showing 3.8  cm aneurysm.  Regardless, I explained that now he has had an 18 month  separation between the first CT and this one showing no significant  change over that time.  I explained that, with his diffuse  arteriomegaly, that we would image him again in 1 year.  I explained  that we would again attempt ultrasound, although he and I both are  somewhat concerning 2 incorrect findings thus far.  I would hate to  commit him to yearly CAT scans since I think there is very little chance  that this will develop into a more serious problem for him.  He  understands symptoms of leaking aneurysm and will notify me should this  occur.  Otherwise, we will see him in 1 year with ultrasound.   Larina Earthly, M.D.  Electronically Signed   TFE/MEDQ  D:  06/24/2007  T:  06/27/2007  Job:  [id number]   cc:   Excell Seltzer. Annabell Howells, M.D.  Ernestina Penna, M.D.

## 2010-10-14 NOTE — Consult Note (Signed)
[name], [name]                  ACCOUNT NO.:  [id number]   MEDICAL RECORD NO.:  [id number]          PATIENT TYPE:  EMS   LOCATION:  ED                           FACILITY[facility]   PHYSICIAN:  Oswald Hillock, MD        DATE OF BIRTH:  Oct 27, 1931   DATE OF CONSULTATION:  05/28/2008  DATE OF DISCHARGE:                                 CONSULTATION   REASON FOR CONSULTATION:  Preop clearance.   CHIEF COMPLAINT/HISTORY OF PRESENT ILLNESS:  The patient is a 76-year-  old Caucasian male, who presents to the emergency room status post a  mechanical fall and was noted to have a right intertrochanteric hip  fracture.  We were consulted for preop clearance given patient's  multiple medical problems.  Upon further questioning, the patient states  that he did not have any chest pain, shortness of breath, palpitations,  or dizziness prior to, during, or after the fall.  At the time of this  interview, patient denies any other symptoms.  He has some minimal  discomfort in the right hip area.  Patient does have a significant  history of an abdominal aortic aneurysm, which as per the last reports  in our records, is about 3.8 cm.  He also has history of benign  prostatic hyperplasia and denies any history of hypertension, diabetes,  coronary artery disease, or any strokes.   PAST MEDICAL HISTORY:  1. Benign prostatic hyperplasia.  2. Abdominal aortic aneurysm.  3. Glaucoma.  4. History of nephrolithiasis.  5. History of back pain.   PAST SURGICAL HISTORY:  Patient had a back surgery for severe spinal  stenosis and a large ruptured disk, L4-L5.  The procedure was a  decompressive laminectomy.   CURRENT MEDICATIONS:  Include doxazosin, finasteride, Timolol eye drops.   ALLERGIES:  No known drug allergies.   SOCIAL HISTORY:  Patient denies any recent history of alcohol, tobacco,  or drug use, lives with family and is independent in all his ADLs.   FAMILY HISTORY:  No history of coronary artery  disease or stroke or  diabetes mellitus in the family.   REVIEW OF SYSTEMS:  An extensive review of systems was done and all  systems were negative except for the positives mentioned in the History  of Present Illness.   PHYSICAL EXAMINATION:  VITAL SIGNS:  On admission, pulse of 55, blood  pressure 141/56, respiratory rate of 18, temperature 97.9.  GENERAL:  The patient is conscious, alert, oriented to time, place, and  person, in no acute distress.  HEENT:  No scleral icterus, no conjunctival congestion, no pallor, ears  negative, no significant oropharyngeal lesions.  NECK:  Supple, no lymphadenopathy, no JVD, no carotid bruit.  CHEST:  Breath sounds heard bilaterally, good air entry, and diminished  breath sounds on the left side.  CVS:  S1 is 2+, irregular, no gallop, rub, or murmur appreciated.  ABDOMEN:  Soft, obese, and nontender, bowel sounds are present, no  pulsatile masses noted in the abdominal examination.  EXTREMITIES:  No cyanosis, clubbing, or edema.  The right  lower  extremity is slightly shortened with external rotation, peripheral  pulses are present.  NEUROLOGIC:  Cranial nerves II-XII appear grossly intact.  Patient moves  all 4 extremities.   LABORATORY DATA:  Sodium 144, potassium 3.8, chloride 109, CO2 26,  glucose 102, BUN 17, creatinine 0.8.  PT 14.2, INR 1.1.  WBC count 10.8,  hemoglobin 14.8, hematocrit 43.9, platelet count of 217.  Urinalysis was  essentially negative except for a small bilirubin and a small blood, no  nitrite, and no leuk-esterase.  EKG showed normal sinus rhythm, left  anterior fascicular block, and no new changes compared with a previous  EKG.   IMPRESSION AND PLAN:  This is a case of a 77 year old Caucasian male  with a history of benign prostatic hypertrophy and glaucoma and  abdominal aortic aneurysm, who presents status post fall with a right  intertrochanteric hip fracture.   Preoperative clearance:  Patient's only major  risk factor is his age and  also the fact that he has an abdominal aortic aneurysm.  However, it has  been stable based on his most recent evaluation.  At this time, there is  no absolute contraindication to surgery as he does not have any other  major risk factors.  Patient should be able to proceed with surgery,  though we will still class him as intermediate risk given his age and  history of his abdominal aortic aneurysm.  We would recommend holding  his doxazosin preoperatively and postoperatively to prevent  postoperative hypotension, monitor his hemoglobin and hematocrit  closely, and use compression devices for deep venous thrombosis  prophylaxis.  Incentive spirometry postoperatively and close monitoring  of his cardiovascular status.   Thank you for allowing [place] to participate in the care of this patient.  Please feel free to call us with any questions.      Oswald Hillock, MD  Electronically Signed     BA/MEDQ  D:  05/28/2008  T:  05/28/2008  Job:  [id number]

## 2010-10-14 NOTE — Assessment & Plan Note (Signed)
OFFICE VISIT   [name], [name]  DOB:  [birth year]                                       07/27/2008  [id number]#:[id number]   The patient presents today for continued follow-up of his known  asymptomatic infrarenal abdominal aortic aneurysm.  Since my last visit  with him he has had Mays hip fracture in December.  He is recovering from  this but continue to walk with Mays walker.  He has no symptoms referable  to his aneurysm.  He denies any new cardiac difficulties.  He is not Mays  diabetic.  He is not hypertensive.   PHYSICAL EXAMINATION TODAY:  Blood pressure is 144/73, pulse 56, his  radial and femoral pulses are 2+.  He has 2+ popliteal pulses.  He does  have some swelling.  I do not palpate distal pulses in his feet.  Abdominal:  Reveals obesity.  I do not feel an aneurysm.   He underwent an ultrasound today and this reveals Mays maximal diameter of  3.8 cm, which is similar to that on Mays prior CT scan.  He had an aberrant  number from Mays study at [facility] suggesting Mays 4.8 cm aneurysm  several years ago.  This clearly was an error.  He was reassured with  this and we will continue to follow him on Mays yearly basis with  ultrasound.   Larina Earthly, M.D.  Electronically Signed   TFE/MEDQ  D:  07/27/2008  T:  07/30/2008  Job:  [id number]   cc:   Excell Seltzer. Annabell Howells, M.D.  Ernestina Penna, M.D.

## 2010-10-14 NOTE — Procedures (Signed)
DUPLEX ULTRASOUND OF ABDOMINAL AORTA   INDICATION:  Followup abdominal aortic aneurysm.   HISTORY:  Diabetes:  No.  Cardiac:  No.  Hypertension:  No.  Smoking:  Previous.  Connective Tissue Disorder:  Family History:  Brother has had AAA.  Previous Surgery:   DUPLEX EXAM:         AP (cm)                   TRANSVERSE (cm)  Proximal             2.81 cm                   2.83 cm  Mid                  3.19 cm                   3.24 cm  Distal               2.09 cm                   3.63 cm  Right Iliac          1.15 cm                   1.84 cm  Left Iliac           1.11 cm                   1.49 cm   PREVIOUS:  Date:  07/27/2008  AP:  3.32  TRANSVERSE:  3.87   IMPRESSION:  1. Abdominal aortic aneurysm with largest measurement of 3.19 x 3.24      cm.  2. Poor visualization due to body habitus, bowel gas pattern and      broken rib.   ___________________________________________  Larina Earthly, M.D.   CJ/MEDQ  D:  07/12/2009  T:  07/12/2009  Job:  [id number]

## 2010-10-14 NOTE — Procedures (Signed)
DUPLEX ULTRASOUND OF ABDOMINAL AORTA   INDICATION:  Follow-up abdominal aortic aneurysm.   HISTORY:  Diabetes:  No.  Cardiac:  No.  Hypertension:  No.  Smoking:  Quit about 10 years ago.  Connective Tissue Disorder:  Family History:  Previous Surgery:   DUPLEX EXAM:         AP (cm)                   TRANSVERSE (cm)  Proximal             2.78 cm                   2.81 cm  Mid                  3.32 cm                   3.87 cm  Distal               2.53 cm                   2.83 cm  Right Iliac          1.54 cm                   1.34 cm  Left Iliac           1.56 cm                   1.38 cm   PREVIOUS:  Date:  AP:  2.4  TRANSVERSE:  2.7   IMPRESSION:  Abdominal aortic aneurysm noted with the largest  measurement of (3.32 cm x 3.87 cm).   ___________________________________________  Larina Earthly, M.D.   MG/MEDQ  D:  07/27/2008  T:  07/27/2008  Job:  [id number]
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
## 2010-10-14 NOTE — Assessment & Plan Note (Signed)
OFFICE VISIT   [name], [name] A  DOB:  1932/03/26                                       02/08/2009  [id number]#:[id number]   The patient presents today for evaluation of lower extremity edema.  He  is known to me from prior follow-up of a small infrarenal abdominal  aortic aneurysm.  He has presented with increasing leg edema  bilaterally.  It used to be mainly in his right leg and has now  progressed to left leg swelling as well.  He had a right leg venous  ulcer which was appropriately treated by Dr. Elana Alm with Roland Rack boot, has  now healed.  He does have classic findings of brawny edema around his  ankles and distal calves bilaterally, this is circumferential.  He does  have marked swelling bilaterally.  He does have a history of prior right  leg DVT and pulmonary embolus after a leg fracture in 1986.  He is not  on Coumadin.  He does have 2+ dorsalis pedis pulses bilaterally.  He  underwent noninvasive vascular laboratory studies in our office today  and this reveals no evidence of DVT.  He has deep valvular incompetence  bilaterally.  I discussed this with the patient and his wife present.  I  explained the critical importance of elevation and compression.  He  reports that he is having difficulty wearing compression garments and  having difficulty fitting.  He and his wife both are unable to place  these.  I have suggested potentially Ace wraps for relief of this.  They  will also discuss potential increased diuretic treatment the next time  they see Dr. Elana Alm.  He will see me again in February or March with  ultrasound of his aneurysm, as scheduled, for follow-up of a small  aneurysm.   Larina Earthly, M.D.  Electronically Signed   TFE/MEDQ  D:  02/08/2009  T:  02/11/2009  Job:  [id number]   cc:   S. Kyra Manges, M.D.  Ernestina Penna, M.D.

## 2010-10-14 NOTE — Discharge Summary (Signed)
NAMEWINTON, [name]                  ACCOUNT NO.:  [id number]   MEDICAL RECORD NO.:  [id number]          PATIENT TYPE:  INP   LOCATION:  1609                         FACILITY:  [facility]   PHYSICIAN:  John L. Rendall, M.D.  DATE OF BIRTH:  02-11-1932   DATE OF ADMISSION:  05/28/2008  DATE OF DISCHARGE:                               DISCHARGE SUMMARY   DISCHARGE DATE:  Probable June 04, 2008.   ADMISSION DIAGNOSES:  1. Intertrochanteric right hip fracture, status post old right finger      fracture.  2. Triple abdominal aortic aneurysm.  3. Benign prostatic hypertrophy.  4. Glaucoma.   DISCHARGE DIAGNOSES:  1. Right intertrochanteric femur fracture, status post compression      screw fixation.  2. Urethral trauma due to malposition of Foley catheter.  3. Acute blood loss anemia secondary to surgery.  4. Benign prostatic hypertrophy.  5. Abdominal aortic aneurysm.  6. Glaucoma.   SURGICAL PROCEDURE:  On May 29, 2008, [name] underwent right  compression screw fixation of his intertrochanteric femur fracture by  Dr. Jonny Ruiz L. Rendall assisted by Dr. Arnoldo Morale, PAC.   COMPLICATIONS:  None.   CONSULTS:  1. Neurology consult by Dr. Aldean Ast on May 28, 2008.  2. Internal Medicine consult by [facility], May 28, 2008, for      preop clearance.  3. Case management consult, May 30, 2008, in addition to a      Physical Therapy consult.  4. Occupational Therapy consult, May 31, 2008.  5. Rehab Medicine consult, June 04, 2008.   HISTORY OF PRESENT ILLNESS:  This 77 year old white male patient  presented to the ER with right hip pain after a fall when he tripped  over a dog chain at about 1:30 on the day of admission.  He injured his  right hip, and then was brought to the ER where he was found to have an  intertrochanteric femur fracture.  He is being admitted for surgical  fixation of the fracture.   HOSPITAL COURSE:  They had difficulty placing a Foley in  the ER, and it  did not drain until Urology consult was obtained.  The Foley was in  wrong position and that was readjusted by Dr. Aldean Ast.  He recommended  keeping the Foley in place until the patient was fully ambulatory.  Medicine consult was obtained for preop clearance.  They felt he was  stable for surgery and made some recommendations.  He was able to  undergo surgery on December 29, and he tolerated that well.   POST OP DAY 1:  T-Max 101.1.  Vitals were stable.  Hemoglobin 11.7,  hematocrit 34.8.  He did have some swelling of the right thigh, but pain  was fairly well-controlled with meds.  He was therapy per protocol.  POST OP DAY 2:  T-Max 101, hemoglobin 11.2, hematocrit 33.5.  He had  minimal drainage on his hip wound.  He was switched to p.o. pain meds  and he continued on therapy.   He remained medically stable over the next several days.  He did  make  slow progress with physical therapy, had some issues with constipation,  which was treated with a laxative.  Plans were made for either rehab or  skills placement.   On June 04, 2008, he is doing better with therapy.  He is still  requiring a fair amount of assistance for ambulation.  Foley is still in  place.  He has been started on Septra DS to help prevent infection with  that.  T-Max is 99.8.  Vitals are stable.  Leg is neurovascularly  intact, and it is anticipated he will be ready for transfer to rehab or  a skilled facility once a bed is available.   DISCHARGE INSTRUCTIONS:  Diet:  He can resume a regular diet.   MEDICATIONS:  1. Cardura 4 mg p.o. nightly.  2. Proscar 5 mg p.o. q.a.m.  3. Cosopt 1 drop in both eyes twice a day.  4. Arixtra 2.5 mg subcu q. 10 a.m. with the last dose to be June 08, 2008.  5. Colace 100 mg p.o. b.i.d.  6. Senokot 1 p.o. b.i.d. with meals.  7. Bactrim 1 tablet p.o. b.i.d. with last dose January 7 at 10 p.m.  8. Tylenol 1-2 tablets p.o. q.4 h. p.r.n. temperature  greater  than      101.5.  9. Percocet 1-2 tablets p.o. q.4 hours p.r.n. for pain.  10.Robaxin 500 mg 1-2 tablets p.o. q.6 hours p.r.n. for spasms.   ACTIVITY:  Touch-down weightbearing on the right leg with a walker and  if he does well, he can be advanced to weightbearing as tolerated.   WOUND CARE:  He is to keep the right hip incision cleaned and dry.  Mepilex  can remain in place for another 3-4 days and then it can be  changed, left open to air if no drainage, and paint with Betadine once a  day.  Notify Dr. Priscille Kluver if temperature is greater than 101.5 chills,  pain unrelieved by pain meds, or foul-smelling drainage from the wound.   FOLLOWUP:  He needs to followup with Dr. Priscille Kluver in our office on  Tuesday, June 12, 2008, and you need to call [phone] for that  appointment.  He needs to followup with Dr. Annabell Howells, his urologist, per  his recommendations.   Foley catheter needs to be discontinued once he is fully ambulatory, I  am hoping that will be on January 5.   LABORATORY DATA:  Hemoglobin, hematocrit ranged from 14.8 and 43.9 on  December 28 with a white count of 10.8 to a low of 10.3 and 30.6 with a  white count of 8.3 on January 1.  On January 2, it was 11, 32.3 with a  white count of 7.7.   Glucose ranged from 102 on December 28 to a high of 146 on January 1 to  120 on January 2.   Albumin was low at 3.3 on December 28.  Calcium was low of 8.2 on  December 30.  UA on December 31 show trace leukocyte esterase, negative  nitrates, 36 white cells (within normal limits).   Chest x-ray on December 28 showed no acute findings, known chronic  pleural effusion.  Repeat chest x-ray on December 31 showed stable  chronic left pleural effusion and left base hypoventilation but no acute  cardiopulmonary abnormality.  X-ray taken of the right femur on December  28 showed an acute right intertrochanteric fracture with remote healed  fracture of the distal femoral diaphysis, and x-ray  of  the hip taken on  December 29 after surgery showed internal fixation of the  intertrochanteric fracture as noted.      Legrand Pitts Duffy, P.A.      John L. Rendall, M.D.  Electronically Signed    KED/MEDQ  D:  06/04/2008  T:  06/04/2008  Job:  [id number]

## 2010-10-14 NOTE — H&P (Signed)
[name], [name]                  ACCOUNT NO.:  [id number]   MEDICAL RECORD NO.:  [id number]          PATIENT TYPE:  IPS   LOCATION:  4008                         FACILITY:  [facility]   PHYSICIAN:  [name], M.D.DATE OF BIRTH:  [birth year]   DATE OF ADMISSION:  06/05/2008  DATE OF DISCHARGE:                              HISTORY & PHYSICAL   CHIEF COMPLAINT:  Right hip pain and fracture.   HISTORY OF PRESENT ILLNESS:  This is a 77 year old white male who was  admitted on May 28, 2008, after tripping over a dog chain.  He  sustained a right intertrochanteric hip fracture and underwent a dynamic  compression screw fixation on May 29, 2008, by Dr. Priscille Mays.  He was  placed on Arixtra for DVT prophylaxis.  His weightbearing as tolerated  right lower extremity.  The patient developed postoperative hematuria  and has had pain issues.  He has been followed by Urology.  He has had  some problems with constipation, stool softener was added.  He states  that he moved his bowels today.  Rehab was consulted and felt the  patient could benefit from an inpatient rehab admission and the patient  was brought today.   REVIEW OF SYSTEMS:  Notable for occasional urinary hesitancy.  Pain has  been fair.  Constipation had been an issue  until recently.  Other  pertinent positives are above.  A full 14-point review is in the written  H&P.   PAST MEDICAL HISTORY:  Positive for BPH; AAA, 4.7 cm; hypertension;  glaucoma; right femur fracture in 1986; and lumbar spine surgery in  2004.   FAMILY HISTORY:  Positive for CAD.   SOCIAL HISTORY:  The patient lives with his wife.  Local daughter works.  Wife can assist, but limited with lifting.  One-level house, 2 steps to  enter.  He does not smoke currently, although did remotely.  He  occasionally drinks.   ALLERGIES:  DARVOCET and NAPSYLATE.   HOME MEDICATIONS:  Cardura, Proscar, and Cosopt ophthalmic drops.   LABORATORY DATA:   Hemoglobin 11, white count 7.7, and platelets 238.  Sodium 141, potassium 4.0, BUN 17, and creatinine 0.8.   PHYSICAL EXAMINATION:  VITAL SIGNS:  Blood pressure is 120/66, pulse is  88, respiratory rate 17, and temperature 98.5.  GENERAL:  The patient is pleasant, sitting in his bed.  He is bit flat  in affect.  HEENT:  Pupils are equal, round, and reactive to light.  Ear, nose, and  throat exam is generally unremarkable.  NECK:  Supple without JVD or lymphadenopathy.  CHEST:  Clear to auscultation bilaterally without any wheezes, rales, or  rhonchi.  HEART:  Regular rate and rhythm without murmurs, rubs, or gallops.  ABDOMEN:  Soft and nontender.  Bowel sounds are positive.  EXTREMITIES:  No clubbing or cyanosis, but trace edema in both  extremities.  SKIN:  Intact.  The right hip with staples in place.  Minimal drainage  was seen.  NEUROLOGIC:  Cranial nerves II-XII are unremarkable.  Reflexes are 2+.  Judgment was good.  Orientation  was intact.  Memory was normal.  Mood  was a bit flat.  Motor function was 5/5 in both upper extremities.  Right prox lower extremity was 1/5 and 4/5 distally.  Left lower  extremity was 3/5 to 5/5 prox to distal.  Sensory exam was grossly  intact.   POSTADMISSION PHYSICIAN EVALUATION:  1. Functional deficit secondary to right intertrochanteric hip      fracture status post dynamic compression screw postoperative day      #7.  2. The patient is admitted to receive collaborative interdisciplinary      care between the physiatrist, rehab nursing staff, and therapy      team.  3. The patient's level of medical complexity and substantial therapy      needs in context of that medical necessity cannot be provided at a      lesser intensity of care.  4. The patient has experienced substantial functional loss from his      baseline.  Upon assessment, the patient's premorbid status was      independent driving.  On rehab evaluation yesterday during the       preadmission screening, the patient was total assist 40 feet with      rolling walker, total assist for transfers, and max to total for      dressing and bathing.  He had not changed substantially as of      admission today.  Based on the patient's diagnosis, physical exam,      and functional history, the patient has potential for functional      progress which will result in measurable gains while on the      inpatient rehab unit.  These gains will be of substantial and      practical use upon discharge to home where his wife will provide      care for him.  Interim changes of medical status and screening are      in the HPI.  5. Physiatrist will provide 24-hour management of medical needs as      well as oversight of therapy plan/treatment and provide guidance as      appropriate regarding interaction of two.  The medical problem list      and plan are listed below.  6. 24-Hour Rehab Nursing will assist in management of the patient's      skin care issues as well as pain management, bowel/urinary      continence and flow, appropriate medication administration, and      safety awareness.  They also will help in integrate therapy      concepts, techniques, and education.  7. PT will assess and treat for lower extremity strengthening and      balance, appropriate safety, awareness, adaptive equipment, and      education as appropriate.  Goals are supervision to modified      independent.  8. OT will assess and treat for upper extremity use, self-care, tasks,      appropriate equipment, and family education as well.  Goals are      supervision and modified independent.  9. Case manager and social worker will assess for psychosocial issues      and discharge planning.  10.Team conference will be held weekly to assess progress towards      goals and to determine barriers to discharge.  11.The patient has demonstrated suspicion medical stability and      exercise capacity to tolerate at  least 3 hours of  therapy per day      at least 5 days per week.  12.Estimated length of stay is 10 days.   PROGNOSIS:  Good.   MEDICAL PROBLEM LIST AND PLAN:  1. Benign prostatic hypertrophy/hematuria:  Continue Proscar and      Cardura.  Observe urinary output.  He has been emptying today      without any signs or symptoms of hematuria.  We will need to      observe while on anticoagulation.  2. Glaucoma:  Continue Cosopt.  3. Pain management:  Continue OxyIR p.r.n. and Robaxin p.r.n. for      spasms.  He sounds as if he is having spasms at times, especially      with activity.  4. DVT prophylaxis:  Continue Arixtra through June 08, 2008.  No      current signs or symptoms of bleeding, but we will observe.  5. Postoperative anemia:  Check admission CBC.      [name], M.D.  Electronically Signed     ZTS/MEDQ  D:  06/05/2008  T:  06/06/2008  Job:  [id number]

## 2010-10-17 NOTE — Op Note (Signed)
NAMEMOHAMMAD, GRANADE                            ACCOUNT NO.:  [id number]   MEDICAL RECORD NO.:  [id number]                   PATIENT TYPE:  OIB   LOCATION:  NA                                   FACILITY:  [facility]   PHYSICIAN:  Donalee Citrin, M.D.                     DATE OF BIRTH:  11-Aug-1931   DATE OF PROCEDURE:  11/17/2002  DATE OF DISCHARGE:                                 OPERATIVE REPORT   PREOPERATIVE DIAGNOSIS:  Severe spinal stenosis from large ruptured disk, L4-  5 right, with bilateral L5 radiculopathy.   PROCEDURE:  Decompressive laminectomy unilaterally on the right at L4-5 with  microscopic decompression of the right L5 nerve root and microscopic  diskectomy.   SURGEON:  Donalee Citrin, M.D.   ASSISTANT:  Marland Mcalpine. Elesa Hacker, M.D.   ANESTHESIA:  General anesthesia.   HISTORY OF PRESENT ILLNESS:  The patient is a very pleasant 77 year old  gentleman who has had longstanding back and what began as right leg pain  radiating down the top of his foot and his big toe, progressed and developed  into his left leg to where actually his left leg was giving him more trouble  recently.  Preoperative imaging showed on the basis of underlying spinal  stenosis a large disk rupture central and rightward compressing the thecal  sac, causing severe spinal stenosis, biforaminal stenosis and L5 nerve root  compression.  The patient had failed conservative treatment with anti-  inflammatories and epidural steroid injections and due to the size of this  disk rupture, the patient was recommended a laminectomy and microdiskectomy.  I extensively went over the risks and benefits of surgery with him.  He  understands and agrees to proceed forward.   The patient was brought to the OR, was induced under general anesthesia.  The patient was positioned prone on the Wilson frame, and the back was  prepped and draped in the usual fashion.  A preoperative x-ray localized the  L4-5 disk space and after  infiltration of 10 mL of lidocaine with  epinephrine, Bovie electrocautery was used to take down the subcutaneous  tissue and subperiosteal dissection was carried out in the lamina of L4 and  L5 on the right.  Intraoperative x-ray confirmed localization of the L4-5  disk space. Using the high-speed drill, the medial aspect of the facet  complex was drilled down as the inferior aspect of the lamina of L4 was  drilled and then using a 3 and 4 mm Kerrison punch, the inferior aspect of  the lamina of L4 and the medial aspect of the facet complex was removed,  exposing the ligamentum flavum.  The ligamentum flavum was noted to be  severely hypertrophied.  This was dissected away from the thecal sac with a  4 Penfield and removed with a 3 and 4 mm Kerrison punch.  Then the  thecal  sac was visualized.  The operating microscope was draped and brought into  the field and under microscope illumination, the remainder of the thecal sac  was dissected off of the ligamentum flavum and the large hypertrophied facet  complex, exposing the proximal aspect of the L5 nerve root.  The L5 nerve  root was opened out is foramen and then using a 4 Penfield, the L5 nerve  root was reflected medially off of a very large and compressive disk mass  that had broken through the ligament at the L4-5 disk space.  The D'Errico  nerve root retractor was used to reflect the L5 nerve root medially.  A  nerve hook was used to tease out a very large fragment of disk from the  lateral aspect of the interspace.  Then the D'Errico nerve root retractor  was repositioned.  The remainder of the annulus was cut with the 11 blade  scalpel, and pituitary rongeurs were used to radically clean out the disk  space.  Using a combination of upward angled and straight pituitary rongeurs  as well as downgoing Epstein curettes, the disk space was radically cleaned  out and several large fragments of disk removed from the central interspace  as  well as the lateral disk space, and the remainder of the disk was  completely decompressed and the thecal sac was completely decompressed,  explored with a hockey stick and a coronary dilator both cephalad and  caudally, medially and laterally, and no further stenosis appreciated.  It  was felt that due to the size of the canal and the aggressiveness of the  diskectomy as well as the decompressive laminectomy allowing [place] to see the  central aspect and leftward aspect of the thecal sac, that this had  adequately decompressed the thecal sac and adequately removed all the disk.  The wound was then copiously irrigated and meticulous hemostasis was  maintained.  Gelfoam was overlaid on top of the dura.  The muscle and fascia  were reapproximated with 0 interrupted Vicryl, the subcutaneous tissue  closed with 2-0 interrupted Vicryl, and the skin was closed in a running 4-0  subcuticular.  Benzoin and Steri-Strips were applied.  The patient went to  the recovery room in stable condition.  At the end of the case all needle  counts and sponge counts were correct.                                               Donalee Citrin, M.D.    GC/MEDQ  D:  11/17/2002  T:  11/20/2002  Job:  [id number]

## 2010-12-31 ENCOUNTER — Encounter (INDEPENDENT_AMBULATORY_CARE_PROVIDER_SITE_OTHER): Payer: Medicare Other

## 2010-12-31 DIAGNOSIS — I7411 Embolism and thrombosis of thoracic aorta: Secondary | ICD-10-CM

## 2011-01-19 NOTE — Procedures (Unsigned)
DUPLEX ULTRASOUND OF ABDOMINAL AORTA  INDICATION:  Follow up AAA.  HISTORY: Diabetes:  No. Cardiac:  No. Hypertension:  No. Smoking:  Yes. Connective Tissue Disorder: Family History: Previous Surgery:  DUPLEX EXAM:         AP (cm)                   TRANSVERSE (cm) Proximal             2.74 cm                   NV Mid                  3.45 cm                   3.45 cm Distal               2.57 cm                   2.7 cm Right Iliac          1.24 cm Left Iliac           1.16 cm  PREVIOUS:  Date: 07/12/2009  AP:  3.19  TRANSVERSE:  3.63  IMPRESSION: 1. A technically difficult study due to bowel gas. 2. Abdominal aortic aneurysm measuring approximately 3.45 cm.     However, the maximum diameter may be slightly larger without     significance of severity category.  ___________________________________________ Larina Earthly, M.D.  LT/MEDQ  D:  12/31/2010  T:  12/31/2010  Job:  [id number]

## 2011-03-06 LAB — COMPREHENSIVE METABOLIC PANEL
Alkaline Phosphatase: 66 U/L (ref 39–117)
Alkaline Phosphatase: 89 U/L (ref 39–117)
BUN: 16 mg/dL (ref 6–23)
BUN: 17 mg/dL (ref 6–23)
CO2: 26 mEq/L (ref 19–32)
Chloride: 109 mEq/L (ref 96–112)
Creatinine, Ser: 0.89 mg/dL (ref 0.4–1.5)
GFR calc non Af Amer: >60 mL/min (ref >60)
Glucose, Bld: 102 mg/dL — ABNORMAL HIGH (ref 70–99)
Glucose, Bld: 141 mg/dL — ABNORMAL HIGH (ref 70–99)
Potassium: 3.8 mEq/L (ref 3.5–5.1)
Potassium: 3.8 mEq/L (ref 3.5–5.1)
Sodium: 144 mEq/L (ref 135–145)
Total Bilirubin: 0.7 mg/dL (ref 0.3–1.2)
Total Bilirubin: 0.9 mg/dL (ref 0.3–1.2)
Total Protein: 5.9 g/dL — ABNORMAL LOW (ref 6.0–8.3)

## 2011-03-06 LAB — URINALYSIS, ROUTINE W REFLEX MICROSCOPIC
Glucose, UA: NEGATIVE mg/dL
Nitrite: NEGATIVE
Protein, ur: NEGATIVE mg/dL
Specific Gravity, Urine: 1.023 (ref 1.005–1.030)
Urobilinogen, UA: 4 mg/dL — ABNORMAL HIGH (ref 0.0–1.0)

## 2011-03-06 LAB — URINE CULTURE
Colony Count: NO GROWTH
Culture: NO GROWTH
Culture: NO GROWTH

## 2011-03-06 LAB — APTT
aPTT: 29 seconds (ref 24–37)
aPTT: 32 seconds (ref 24–37)

## 2011-03-06 LAB — BASIC METABOLIC PANEL
BUN: 13 mg/dL (ref 6–23)
BUN: 13 mg/dL (ref 6–23)
CO2: 24 mEq/L (ref 19–32)
Calcium: 8.6 mg/dL (ref 8.4–10.5)
Chloride: 105 mEq/L (ref 96–112)
Creatinine, Ser: 0.74 mg/dL (ref 0.4–1.5)
GFR calc non Af Amer: >60 mL/min (ref >60)
Glucose, Bld: 104 mg/dL — ABNORMAL HIGH (ref 70–99)
Glucose, Bld: 146 mg/dL — ABNORMAL HIGH (ref 70–99)
Potassium: 3.9 mEq/L (ref 3.5–5.1)

## 2011-03-06 LAB — PROTIME-INR
INR: 1.1 (ref 0.00–1.49)
INR: 1.1 (ref 0.00–1.49)

## 2011-03-06 LAB — GLUCOSE, CAPILLARY: Glucose-Capillary: 117 mg/dL — ABNORMAL HIGH (ref 70–99)

## 2011-03-06 LAB — DIFFERENTIAL
Basophils Absolute: 0 10*3/uL (ref 0.0–0.1)
Basophils Absolute: 0.1 10*3/uL (ref 0.0–0.1)
Basophils Relative: 0 % (ref 0–1)
Basophils Relative: 1 % (ref 0–1)
Neutro Abs: 5.5 10*3/uL (ref 1.7–7.7)
Neutro Abs: 7.5 10*3/uL (ref 1.7–7.7)
Neutrophils Relative %: 70 % (ref 43–77)
Neutrophils Relative %: 70 % (ref 43–77)

## 2011-03-06 LAB — CROSSMATCH: Antibody Screen: NEGATIVE

## 2011-03-06 LAB — CBC
HCT: 33.5 % — ABNORMAL LOW (ref 39.0–52.0)
HCT: 38.4 % — ABNORMAL LOW (ref 39.0–52.0)
HCT: 43.9 % (ref 39.0–52.0)
Hemoglobin: 14.8 g/dL (ref 13.0–17.0)
MCHC: 33.5 g/dL (ref 30.0–36.0)
MCV: 93 fL (ref 78.0–100.0)
Platelets: 174 10*3/uL (ref 150–400)
Platelets: 177 10*3/uL (ref 150–400)
Platelets: 180 10*3/uL (ref 150–400)
RDW: 14.6 % (ref 11.5–15.5)
RDW: 14.8 % (ref 11.5–15.5)
RDW: 14.9 % (ref 11.5–15.5)
WBC: 10.8 10*3/uL — ABNORMAL HIGH (ref 4.0–10.5)
WBC: 7.9 10*3/uL (ref 4.0–10.5)

## 2011-03-06 LAB — URINE MICROSCOPIC-ADD ON

## 2012-02-24 ENCOUNTER — Other Ambulatory Visit: Payer: Self-pay | Admitting: *Deleted

## 2012-02-24 DIAGNOSIS — I714 Abdominal aortic aneurysm, without rupture: Secondary | ICD-10-CM

## 2012-02-26 ENCOUNTER — Encounter: Payer: Self-pay | Admitting: Vascular Surgery

## 2012-02-29 ENCOUNTER — Encounter: Payer: Self-pay | Admitting: Neurosurgery

## 2012-03-01 ENCOUNTER — Ambulatory Visit (INDEPENDENT_AMBULATORY_CARE_PROVIDER_SITE_OTHER): Payer: Medicare Other | Admitting: Neurosurgery

## 2012-03-01 ENCOUNTER — Encounter (INDEPENDENT_AMBULATORY_CARE_PROVIDER_SITE_OTHER): Payer: Medicare Other | Admitting: *Deleted

## 2012-03-01 ENCOUNTER — Encounter: Payer: Self-pay | Admitting: Neurosurgery

## 2012-03-01 VITALS — BP 125/68 | HR 53 | Resp 18 | Ht 77.0 in | Wt 327.0 lb

## 2012-03-01 DIAGNOSIS — I714 Abdominal aortic aneurysm, without rupture: Secondary | ICD-10-CM

## 2012-03-01 NOTE — Progress Notes (Signed)
[facility] [place] AAA/PAD/PVD Office Note  CC: Annual AAA surveillance Referring Physician: Early  History of Present Illness: 78 year old male patient of Dr. Arbie Cookey is followed for known AAA. The patient denies any unusual abdominal or back pain. The patient denies any new medical diagnoses or recent surgery.  Past Medical History  Diagnosis Date  . Venous insufficiency   . AAA (abdominal aortic aneurysm)   . Ulcer   . DVT (deep venous thrombosis)   . Cellulitis     ROS: [x]  Positive   [ ]  Denies    General: [ ]  Weight loss, [ ]  Fever, [ ]  chills Neurologic: [ ]  Dizziness, [ ]  Blackouts, [ ]  Seizure [ ]  Stroke, [ ]  "Mini stroke", [ ]  Slurred speech, [ ]  Temporary blindness; [ ]  weakness in arms or legs, [ ]  Hoarseness Cardiac: [ ]  Chest pain/pressure, [ ]  Shortness of breath at rest [ ]  Shortness of breath with exertion, [ ]  Atrial fibrillation or irregular heartbeat Vascular: [ ]  Pain in legs with walking, [ ]  Pain in legs at rest, [ ]  Pain in legs at night,  [ ]  Non-healing ulcer, [ ]  Blood clot in vein/DVT,   Pulmonary: [ ]  Home oxygen, [ ]  Productive cough, [ ]  Coughing up blood, [ ]  Asthma,  [ ]  Wheezing Musculoskeletal:  [ ]  Arthritis, [ ]  Low back pain, [ ]  Joint pain Hematologic: [ ]  Easy Bruising, [ ]  Anemia; [ ]  Hepatitis Gastrointestinal: [ ]  Blood in stool, [ ]  Gastroesophageal Reflux/heartburn, [ ]  Trouble swallowing Urinary: [ ]  chronic Kidney disease, [ ]  on HD - [ ]  MWF or [ ]  TTHS, [ ]  Burning with urination, [ ]  Difficulty urinating Skin: [ ]  Rashes, [ ]  Wounds Psychological: [ ]  Anxiety, [ ]  Depression   Social History History  Substance Use Topics  . Smoking status: Former Smoker    Types: Cigarettes  . Smokeless tobacco: Current User    Types: Snuff  . Alcohol Use: No    Family History Family History  Problem Relation Age of Onset  . Aneurysm Brother   . Cancer Father     colon    Allergies  Allergen Reactions  .  Oxycodone Hcl     REACTION: makes pt "wild"  . Propoxyphene-Acetaminophen     REACTION: Makes pt. "wild"    Current Outpatient Prescriptions  Medication Sig Dispense Refill  . doxazosin (CARDURA) 8 MG tablet Take 8 mg by mouth at bedtime.      . finasteride (PROSCAR) 5 MG tablet Take 5 mg by mouth daily.      [name] ibuprofen (ADVIL,MOTRIN) 200 MG tablet Take 200 mg by mouth every 6 (six) hours as needed.      . latanoprost (XALATAN) 0.005 % ophthalmic solution At bedtime.      . timolol (BETIMOL) 0.25 % ophthalmic solution Apply 1 drop to eye 2 (two) times daily.      [name] torsemide (DEMADEX) 10 MG tablet Take 10 mg by mouth daily.        Physical Examination  Filed Vitals:   03/01/12 0946  BP: 125/68  Pulse: 53  Resp: 18    Body mass index is 38.78 kg/(m^2).  General:  WDWN in NAD Gait: Normal HEENT: WNL Eyes: Pupils equal Pulmonary: normal non-labored breathing , without Rales, rhonchi,  wheezing Cardiac: RRR, without  Murmurs, rubs or gallops; No carotid bruits Abdomen: soft, NT, no masses Skin: no rashes, ulcers noted Vascular Exam/Pulses: Palpable lower  extremity pulses bilaterally, no abdominal mass is palpated due to girth  Extremities without ischemic changes, no Gangrene , no cellulitis; no open wounds;  Musculoskeletal: no muscle wasting or atrophy  Neurologic: A&O X 3; Appropriate Affect ; SENSATION: normal; MOTOR FUNCTION:  moving all extremities equally. Speech is fluent/normal  Non-Invasive Vascular Imaging: Maximum diameter measurement today is 3.5 distally, 14 months ago the AAA measured 3.45  ASSESSMENT/PLAN: Asymptomatic known AAA, the patient will followup in one year with repeat duplex. The patient's questions were encouraged and answered, he is in agreement with this plan.  Lauree Chandler ANP  M.D.: Early

## 2012-08-23 ENCOUNTER — Encounter: Payer: Self-pay | Admitting: Family Medicine

## 2012-08-23 ENCOUNTER — Ambulatory Visit (INDEPENDENT_AMBULATORY_CARE_PROVIDER_SITE_OTHER): Payer: Medicare Other | Admitting: Family Medicine

## 2012-08-23 VITALS — BP 169/75 | HR 74 | Temp 97.7°F | Ht 74.0 in | Wt 326.8 lb

## 2012-08-23 DIAGNOSIS — L97909 Non-pressure chronic ulcer of unspecified part of unspecified lower leg with unspecified severity: Secondary | ICD-10-CM

## 2012-08-23 DIAGNOSIS — I872 Venous insufficiency (chronic) (peripheral): Secondary | ICD-10-CM

## 2012-08-23 DIAGNOSIS — L97911 Non-pressure chronic ulcer of unspecified part of right lower leg limited to breakdown of skin: Secondary | ICD-10-CM

## 2012-08-23 NOTE — Progress Notes (Signed)
Subjective:     Patient ID: [name], male   DOB: Jan 13, 1932, 79 y.o.   MRN: [id number]  HPI Patient comes in to followup on leg ulcers. He has a son which were removed his legs feels much better.  In the past he was unable to find compression stockings for his size legs. He has gone to [employer] without success. He was told to go to [employer] supplies in [place]. Otherwise he is doing well. Past Medical History  Diagnosis Date  . Venous insufficiency   . AAA (abdominal aortic aneurysm)   . Ulcer   . DVT (deep venous thrombosis)   . Cellulitis   . Glaucoma   . Nicotine dependence   . Bronchospasm   . Elevated PSA   . BPH (benign prostatic hyperplasia)    Past Surgical History  Procedure Laterality Date  . Back surgery    . Insitu percutaneous pinning femur    . Cholecystectomy     History   Social History  . Marital Status: Married    Spouse Name: N/A    Number of Children: N/A  . Years of Education: N/A   Occupational History  . Not on file.   Social History Main Topics  . Smoking status: Former Smoker    Types: Cigarettes    Quit date: 08/24/2002  . Smokeless tobacco: Current User    Types: Snuff     Comment: dips snuff  . Alcohol Use: No  . Drug Use: No  . Sexually Active: Not on file   Other Topics Concern  . Not on file   Social History Narrative  . No narrative on file   Family History  Problem Relation Age of Onset  . Aneurysm Brother   . Cancer Father     colon   Current Outpatient Prescriptions on File Prior to Visit  Medication Sig Dispense Refill  . doxazosin (CARDURA) 8 MG tablet Take 8 mg by mouth at bedtime.      . finasteride (PROSCAR) 5 MG tablet Take 5 mg by mouth daily.      [name] ibuprofen (ADVIL,MOTRIN) 200 MG tablet Take 200 mg by mouth every 6 (six) hours as needed.      . latanoprost (XALATAN) 0.005 % ophthalmic solution At bedtime.      . timolol (BETIMOL) 0.25 % ophthalmic solution Apply 1 drop to eye 2  (two) times daily.      [name] torsemide (DEMADEX) 10 MG tablet Take 10 mg by mouth daily.       No current facility-administered medications on file prior to visit.   Allergies  Allergen Reactions  . Oxycodone Hcl     REACTION: makes pt "wild"  . Propoxyphene-Acetaminophen     REACTION: Makes pt. "wild"   Immunization History  Administered Date(s) Administered  . Pneumococcal Polysaccharide 03/02/2007   Prior to Admission medications   Medication Sig Start Date End Date Taking? Authorizing Provider  doxazosin (CARDURA) 8 MG tablet Take 8 mg by mouth at bedtime.   Yes Historical Provider, MD  finasteride (PROSCAR) 5 MG tablet Take 5 mg by mouth daily.   Yes Historical Provider, MD  ibuprofen (ADVIL,MOTRIN) 200 MG tablet Take 200 mg by mouth every 6 (six) hours as needed.   Yes Historical Provider, MD  latanoprost (XALATAN) 0.005 % ophthalmic solution At bedtime. 01/25/12  Yes Historical Provider, MD  timolol (BETIMOL) 0.25 % ophthalmic solution Apply 1 drop to eye 2 (two) times daily.   Yes  Historical Provider, MD  torsemide (DEMADEX) 10 MG tablet Take 10 mg by mouth daily.   Yes Historical Provider, MD     Review of Systems No other symptoms today    Objective:   Physical Exam    On examination he appeared in no acute distress. Vital signs as documented. BP 169/75  Pulse 74  Temp(Src) 97.7 F (36.5 C) (Oral)  Ht 6\' 2"  (1.88 m)  Wt 326 lb 12.8 oz (148.236 kg)  BMI 41.94 kg/m2 Obese. Ambulates with a cane. Skin warm and dry and without overt rashes.  Head &Neck without JVD. Normal. Lungs clear.  Heart exam notable for regular rhythm, normal sounds and absence of murmurs, rubs or gallops.  Abdomen unremarkable and without evidence of organomegaly, masses, or abdominal aortic enlargement.  Extremities 3+ edematous. The leg ulcers have healed the skin is dry and scab. Neurologic: oriented to name, place, and time. Difficulty ambulating due to his musculoskeletal  problems Assessment:     Ulcers of both lower legs, limited to breakdown of skin  Chronic venous insufficiency      Plan:       Medications prescribed: Prescribed for medium range compression stockings knee-high. Hand written prescriptions given to wife. I advised to try a different medical supplier in [place]. Skin care. Return to clinic in 4 weeks for routine followup of his other medical problems.  [place] P. Modesto Charon, M.D.

## 2012-08-26 ENCOUNTER — Telehealth: Payer: Self-pay | Admitting: Family Medicine

## 2012-08-26 NOTE — Telephone Encounter (Signed)
Per Dr Modesto Charon "medium grade"   Wife aware.

## 2012-08-30 ENCOUNTER — Telehealth: Payer: Self-pay | Admitting: Family Medicine

## 2012-08-31 ENCOUNTER — Telehealth: Payer: Self-pay | Admitting: *Deleted

## 2012-08-31 ENCOUNTER — Other Ambulatory Visit: Payer: Self-pay | Admitting: Family Medicine

## 2012-08-31 DIAGNOSIS — I872 Venous insufficiency (chronic) (peripheral): Secondary | ICD-10-CM

## 2012-08-31 NOTE — Telephone Encounter (Signed)
Pts wife notified that referral has been made and is in the works

## 2012-08-31 NOTE — Telephone Encounter (Signed)
Referral done in Epic 

## 2012-09-07 ENCOUNTER — Telehealth: Payer: Self-pay

## 2012-09-07 DIAGNOSIS — I998 Other disorder of circulatory system: Secondary | ICD-10-CM

## 2012-09-07 NOTE — Telephone Encounter (Signed)
Mrs. Nyland said that a referral was suppose be made for Dr Early at [facility]

## 2012-09-08 NOTE — Telephone Encounter (Signed)
Spoke with wife referral submitted . Wife wants done soon as daughter on spring break Request not be done on Wednesday

## 2012-09-09 ENCOUNTER — Other Ambulatory Visit: Payer: Self-pay | Admitting: *Deleted

## 2012-09-09 DIAGNOSIS — R609 Edema, unspecified: Secondary | ICD-10-CM

## 2012-09-09 DIAGNOSIS — L97909 Non-pressure chronic ulcer of unspecified part of unspecified lower leg with unspecified severity: Secondary | ICD-10-CM

## 2012-09-19 ENCOUNTER — Encounter: Payer: Self-pay | Admitting: Vascular Surgery

## 2012-09-20 ENCOUNTER — Encounter: Payer: Self-pay | Admitting: Vascular Surgery

## 2012-09-20 ENCOUNTER — Ambulatory Visit (INDEPENDENT_AMBULATORY_CARE_PROVIDER_SITE_OTHER): Payer: Medicare Other | Admitting: Vascular Surgery

## 2012-09-20 ENCOUNTER — Encounter (INDEPENDENT_AMBULATORY_CARE_PROVIDER_SITE_OTHER): Payer: Medicare Other | Admitting: *Deleted

## 2012-09-20 ENCOUNTER — Ambulatory Visit: Payer: Medicare Other | Admitting: Family Medicine

## 2012-09-20 VITALS — BP 146/57 | HR 57 | Ht 74.0 in | Wt 329.6 lb

## 2012-09-20 DIAGNOSIS — R609 Edema, unspecified: Secondary | ICD-10-CM

## 2012-09-20 DIAGNOSIS — I739 Peripheral vascular disease, unspecified: Secondary | ICD-10-CM

## 2012-09-20 DIAGNOSIS — L97909 Non-pressure chronic ulcer of unspecified part of unspecified lower leg with unspecified severity: Secondary | ICD-10-CM

## 2012-09-20 HISTORY — DX: Peripheral vascular disease, unspecified: I73.9

## 2012-09-20 NOTE — Progress Notes (Signed)
Patient presents today for evaluation of lower surety venous stasis disease. He is well known to me from prior followup of his infrarenal aneurysm. He is here today with his wife and his daughter. He does have history of bilateral venous stasis ulceration and has been treated appropriately with Unna boot therapy. He currently does not have any open ulcers. He has attempted to wear compression but has a very difficult time due to the size. He was recently measured for custom stockings and hopefully will have these within the next week. He does have a remote history of pulmonary embolus and no family here unclear as to whether or not he had a documented DVT. This was the time around the time of a right femur fracture.  Past Medical History  Diagnosis Date  . Venous insufficiency   . AAA (abdominal aortic aneurysm)   . Ulcer   . DVT (deep venous thrombosis)   . Cellulitis   . Glaucoma   . Nicotine dependence   . Bronchospasm   . Elevated PSA   . BPH (benign prostatic hyperplasia)   . COPD (chronic obstructive pulmonary disease)     History  Substance Use Topics  . Smoking status: Former Smoker    Types: Cigarettes    Quit date: 08/24/2002  . Smokeless tobacco: Current User    Types: Snuff     Comment: dips snuff  . Alcohol Use: No    Family History  Problem Relation Age of Onset  . Aneurysm Brother   . Cancer Father     colon    Allergies  Allergen Reactions  . Oxycodone Hcl     REACTION: makes pt "wild"  . Propoxyphene-Acetaminophen     REACTION: Makes pt. "wild"    Current outpatient prescriptions:doxazosin (CARDURA) 8 MG tablet, Take 8 mg by mouth at bedtime., Disp: , Rfl: ;  finasteride (PROSCAR) 5 MG tablet, Take 5 mg by mouth daily., Disp: , Rfl: ;  latanoprost (XALATAN) 0.005 % ophthalmic solution, At bedtime., Disp: , Rfl: ;  torsemide (DEMADEX) 10 MG tablet, Take 10 mg by mouth daily., Disp: , Rfl:  ibuprofen (ADVIL,MOTRIN) 200 MG tablet, Take 200 mg by mouth every 6  (six) hours as needed., Disp: , Rfl: ;  timolol (BETIMOL) 0.25 % ophthalmic solution, Apply 1 drop to eye 2 (two) times daily., Disp: , Rfl:   BP 146/57  Pulse 57  Ht 6\' 2"  (1.88 m)  Wt 329 lb 9.6 oz (149.506 kg)  BMI 42.3 kg/m2  SpO2 100%  Body mass index is 42.3 kg/(m^2).       Physical exam well-developed well-nourished gentleman appearing stated age in no acute distress He does have 2+ dorsalis pedis pulses bilaterally Marked changes venous stasis disease with circumferential hemosiderin deposits from below his knees down onto his ankles. He does have scaling and swelling but no open ulcer currently  Venous duplex today reveals chronic DVT in his right femoral vein. He does have reflux in his deep venous system bilaterally. There is mild insignificant scattered reflux in his great saphenous vein  Impression and plan chronic venous insufficiency related to deep venous valvular incompetence and chronic DVT in his right femoral vein. I discussed this at length with the family and the patient explaining that this is not limb threatening but will be a lifelong aggravation. I stressed the importance of elevation and compression. He sleeps in a lift chair do to difficulty getting him out of bed related to no hip fracture. Hopefully he  will be able to tolerate his custom fitted compression. Also explained the option of a wrapping with Ace wraps for compression. We will see him in the fall for his routine followup of his down aortic aneurysm. His most recent study in October 2013 showed a 3.5 cm infrarenal abdominal aortic aneurysm

## 2012-09-27 ENCOUNTER — Telehealth: Payer: Self-pay | Admitting: Family Medicine

## 2012-09-27 NOTE — Telephone Encounter (Signed)
Spoke with wife and she said she did not call and had no questions about his appt.

## 2012-10-03 ENCOUNTER — Telehealth: Payer: Self-pay | Admitting: Family Medicine

## 2012-10-03 NOTE — Telephone Encounter (Signed)
Left mess to for [name] to return call

## 2012-10-04 NOTE — Telephone Encounter (Signed)
[name] returned call and concerned about issues with memory issues.  appt Thursday with Dr Hezzie Bump daughter will discus at appt

## 2012-10-06 ENCOUNTER — Encounter: Payer: Self-pay | Admitting: Family Medicine

## 2012-10-06 ENCOUNTER — Ambulatory Visit (INDEPENDENT_AMBULATORY_CARE_PROVIDER_SITE_OTHER): Payer: Medicare Other | Admitting: Family Medicine

## 2012-10-06 VITALS — BP 144/78 | HR 80 | Temp 98.6°F | Wt 325.6 lb

## 2012-10-06 DIAGNOSIS — R4189 Other symptoms and signs involving cognitive functions and awareness: Secondary | ICD-10-CM | POA: Insufficient documentation

## 2012-10-06 DIAGNOSIS — I714 Abdominal aortic aneurysm, without rupture, unspecified: Secondary | ICD-10-CM

## 2012-10-06 DIAGNOSIS — F068 Other specified mental disorders due to known physiological condition: Secondary | ICD-10-CM | POA: Insufficient documentation

## 2012-10-06 DIAGNOSIS — I872 Venous insufficiency (chronic) (peripheral): Secondary | ICD-10-CM

## 2012-10-06 DIAGNOSIS — R609 Edema, unspecified: Secondary | ICD-10-CM

## 2012-10-06 DIAGNOSIS — F0390 Unspecified dementia without behavioral disturbance: Secondary | ICD-10-CM

## 2012-10-06 DIAGNOSIS — R600 Localized edema: Secondary | ICD-10-CM

## 2012-10-06 DIAGNOSIS — E669 Obesity, unspecified: Secondary | ICD-10-CM

## 2012-10-06 DIAGNOSIS — J4489 Other specified chronic obstructive pulmonary disease: Secondary | ICD-10-CM

## 2012-10-06 DIAGNOSIS — F09 Unspecified mental disorder due to known physiological condition: Secondary | ICD-10-CM

## 2012-10-06 DIAGNOSIS — J449 Chronic obstructive pulmonary disease, unspecified: Secondary | ICD-10-CM

## 2012-10-06 HISTORY — DX: Venous insufficiency (chronic) (peripheral): I87.2

## 2012-10-06 HISTORY — DX: Obesity, unspecified: E66.9

## 2012-10-06 HISTORY — DX: Abdominal aortic aneurysm, without rupture: I71.4

## 2012-10-06 HISTORY — DX: Unspecified dementia, unspecified severity, without behavioral disturbance, psychotic disturbance, mood disturbance, and anxiety: F03.90

## 2012-10-06 HISTORY — DX: Abdominal aortic aneurysm, without rupture, unspecified: I71.40

## 2012-10-06 LAB — COMPLETE METABOLIC PANEL WITH GFR
ALT: 15 U/L (ref 0–53)
AST: 19 U/L (ref 0–37)
Albumin: 4 g/dL (ref 3.5–5.2)
Alkaline Phosphatase: 77 U/L (ref 39–117)
BUN: 21 mg/dL (ref 6–23)
CO2: 27 mEq/L (ref 19–32)
Calcium: 9.6 mg/dL (ref 8.4–10.5)
Chloride: 106 mEq/L (ref 96–112)
Creat: 1.26 mg/dL (ref 0.50–1.35)
GFR, Est African American: 62 mL/min
GFR, Est Non African American: 54 mL/min — ABNORMAL LOW
Glucose, Bld: 94 mg/dL (ref 70–99)
Potassium: 4.3 mEq/L (ref 3.5–5.3)
Sodium: 142 mEq/L (ref 135–145)
Total Bilirubin: 0.7 mg/dL (ref 0.3–1.2)
Total Protein: 6.6 g/dL (ref 6.0–8.3)

## 2012-10-06 LAB — POCT CBC
Granulocyte percent: 62.5 %G (ref 37–80)
HCT, POC: 43.5 % (ref 43.5–53.7)
Hemoglobin: 14.5 g/dL (ref 14.1–18.1)
Lymph, poc: 2.2 (ref 0.6–3.4)
MCH, POC: 29.7 pg (ref 27–31.2)
MCHC: 33.4 g/dL (ref 31.8–35.4)
MCV: 89.1 fL (ref 80–97)
MPV: 7.7 fL (ref 0–99.8)
POC Granulocyte: 4.6 (ref 2–6.9)
POC LYMPH PERCENT: 30 %L (ref 10–50)
Platelet Count, POC: 204 10*3/uL (ref 142–424)
RBC: 4.9 M/uL (ref 4.69–6.13)
RDW, POC: 15.1 %
WBC: 7.4 10*3/uL (ref 4.6–10.2)

## 2012-10-06 LAB — LIPID PANEL
Cholesterol: 157 mg/dL (ref 0–200)
HDL: 34 mg/dL — ABNORMAL LOW (ref >39)
LDL Cholesterol: 96 mg/dL (ref 0–99)
Total CHOL/HDL Ratio: 4.6 Ratio
Triglycerides: 135 mg/dL (ref <150)
VLDL: 27 mg/dL (ref 0–40)

## 2012-10-06 LAB — TSH: TSH: 1.604 u[IU]/mL (ref 0.350–4.500)

## 2012-10-06 LAB — VITAMIN B12: Vitamin B-12: 282 pg/mL (ref 211–911)

## 2012-10-06 LAB — FOLATE: Folate: >20 ng/mL

## 2012-10-06 MED ORDER — DONEPEZIL HCL 10 MG PO TABS: 10.0000 mg | ORAL_TABLET | Freq: Every evening | ORAL | Status: DC | PRN

## 2012-10-06 NOTE — Progress Notes (Signed)
Patient ID: [name], male   DOB: 1931/11/25, 79 y.o.   MRN: [id number] SUBJECTIVE: HPI: Came for follow up on his legs. Finally will be getting compression stockings soon which were special ordered. His daughter is a Runner, broadcasting/film/video and could not be here. She sent a note specifying a lot of behavioral and cognitive changes that the family has noticed. Otherwise he feels okay today. His legs are wrapped with ACE wraps.   PMH/PSH: reviewed/updated in Epic  SH/FH: reviewed/updated in Epic  Allergies: reviewed/updated in Epic  Medications: reviewed/updated in Epic  Immunizations: reviewed/updated in Epic  ROS: As above in the HPI. All other systems are stable or negative.  OBJECTIVE: APPEARANCE:  Patient in no acute distress.The patient appeared well nourished and normally developed. Acyanotic. Waist: VITAL SIGNS:BP 144/78  Pulse 80  Temp(Src) 98.6 F (37 C) (Oral)  Wt 325 lb 9.6 oz (147.691 kg)  BMI 41.79 kg/m2 Tall Obese WM NAD  SKIN: warm and  Dry without overt rashes, tattoos and scars  HEAD and Neck: without JVD, Head and scalp: normal Eyes:No scleral icterus. Fundi normal, eye movements normal. Ears: Auricle normal, canal normal, Tympanic membranes normal, insufflation normal. Nose: normal Throat: normal Neck & thyroid: normal  CHEST & LUNGS: Chest wall: normal Lungs: Clear  CVS: Reveals the PMI to be normally located. Regular rhythm, First and Second Heart sounds are normal,  absence of murmurs, rubs or gallops. Peripheral vasculature: Radial pulses: normal Dorsal pedis pulses: normal Posterior pulses: normal  ABDOMEN:  Appearance: obese Benign,, no organomegaly, no masses, no Abdominal Aortic enlargement. No Guarding , no rebound. No Bruits. Bowel sounds: normal  RECTAL: N/A GU: N/A  EXTREMETIES: edematous 4+Bilaterally with stasis dermatitis rash. No ulcers today. Both Femoral pulses are normal.  MUSCULOSKELETAL:  Joints:arthritic knees and hips.  Ambulates with a cane.  NEUROLOGIC: oriented to place and person; nonfocal. Cranial Nerves are normal.  MMSE: 17/30  ASSESSMENT: Pedal edema - Plan: COMPLETE METABOLIC PANEL WITH GFR  Chronic venous insufficiency  COPD (chronic obstructive pulmonary disease)  Obesity, unspecified  AAA (abdominal aortic aneurysm) without rupture - Plan: Lipid panel  Cognitive impairment  Dementia - Plan: donepezil (ARICEPT) 10 MG tablet, POCT CBC, COMPLETE METABOLIC PANEL WITH GFR, TSH, Vitamin B12, Folate  PLAN: Orders Placed This Encounter  Procedures  . COMPLETE METABOLIC PANEL WITH GFR  . Lipid panel  . TSH  . Vitamin B12  . Folate  . POCT CBC   Results for orders placed in visit on 10/06/12  POCT CBC      Result Value Range   WBC 7.4  4.6 - 10.2 K/uL   Lymph, poc 2.2  0.6 - 3.4   POC LYMPH PERCENT 30.0  10 - 50 %L   POC Granulocyte 4.6  2 - 6.9   Granulocyte percent 62.5  37 - 80 %G   RBC 4.9  4.69 - 6.13 M/uL   Hemoglobin 14.5  14.1 - 18.1 g/dL   HCT, POC 40.9  81.1 - 53.7 %   MCV 89.1  80 - 97 fL   MCH, POC 29.7  27 - 31.2 pg   MCHC 33.4  31.8 - 35.4 g/dL   RDW, POC 91.4     Platelet Count, POC 204.0  142 - 424 K/uL   MPV 7.7  0 - 99.8 fL   Meds ordered this encounter  Medications  . donepezil (ARICEPT) 10 MG tablet    Sig: Take 1 tablet (10 mg total) by mouth at bedtime  as needed.    Dispense:  30 tablet    Refill:  3   Discussed with wife that we should consider starting aricept.However , the  Efficacy can be in question.  Will discuss with their daughter tomorrow.  Await labs  RTc in 4 weeks.  Patient to get the compression stockings that have been special ordered for him and his size.  Jiovanna Frei P. Modesto Charon, M.D.

## 2012-10-09 NOTE — Progress Notes (Signed)
Quick Note:  Lab result close to goal. No change in Medications for now. No Change in plans and follow up.  ______ 

## 2012-10-11 ENCOUNTER — Telehealth: Payer: Self-pay | Admitting: Family Medicine

## 2012-11-21 NOTE — Telephone Encounter (Signed)
Done

## 2012-12-08 ENCOUNTER — Ambulatory Visit (INDEPENDENT_AMBULATORY_CARE_PROVIDER_SITE_OTHER): Payer: Medicare Other | Admitting: Family Medicine

## 2012-12-08 ENCOUNTER — Encounter: Payer: Self-pay | Admitting: Family Medicine

## 2012-12-08 VITALS — BP 157/74 | HR 82 | Temp 98.2°F | Wt 324.2 lb

## 2012-12-08 DIAGNOSIS — F09 Unspecified mental disorder due to known physiological condition: Secondary | ICD-10-CM

## 2012-12-08 DIAGNOSIS — R635 Abnormal weight gain: Secondary | ICD-10-CM

## 2012-12-08 DIAGNOSIS — I714 Abdominal aortic aneurysm, without rupture, unspecified: Secondary | ICD-10-CM

## 2012-12-08 DIAGNOSIS — R4189 Other symptoms and signs involving cognitive functions and awareness: Secondary | ICD-10-CM

## 2012-12-08 DIAGNOSIS — I739 Peripheral vascular disease, unspecified: Secondary | ICD-10-CM

## 2012-12-08 DIAGNOSIS — R600 Localized edema: Secondary | ICD-10-CM

## 2012-12-08 DIAGNOSIS — I872 Venous insufficiency (chronic) (peripheral): Secondary | ICD-10-CM

## 2012-12-08 DIAGNOSIS — R609 Edema, unspecified: Secondary | ICD-10-CM

## 2012-12-08 DIAGNOSIS — F0390 Unspecified dementia without behavioral disturbance: Secondary | ICD-10-CM

## 2012-12-08 DIAGNOSIS — E669 Obesity, unspecified: Secondary | ICD-10-CM

## 2012-12-08 DIAGNOSIS — J449 Chronic obstructive pulmonary disease, unspecified: Secondary | ICD-10-CM

## 2012-12-08 MED ORDER — DONEPEZIL HCL 10 MG PO TABS: 10.0000 mg | ORAL_TABLET | Freq: Every day | ORAL | Status: DC

## 2012-12-08 NOTE — Progress Notes (Signed)
Patient ID: [name], male   DOB: 07-31-1931, 79 y.o.   MRN: [id number] SUBJECTIVE: CC: Chief Complaint  Patient presents with  . Follow-up    2 month follow up daughter states not started aricept or nameda .     HPI:  Breakfast: 2-3 eggs, grits toast Lunch: BLT sandwich nabbs as snacks Dinner: tossed salad,   DAUGHTER HER TO ASK QUESTIONS AS TO THE EFFICACY OF ALZHEIMER'S MEDICATIONS. Patient here for follow up after being seen by DR Early the Vascular surgeon who advised them to wrap the legs with bandages instaed of the customized stockings which he felt will be difficult for him to put on. Still has pedal edema but no skin breaks as yet.  Cognitive impairment is the same so far.  Obesity, unchanged. Hos wife has always been an excellent cook of coutry style meals which he loves. Copd stable AAA stable   Past Medical History  Diagnosis Date  . Venous insufficiency   . AAA (abdominal aortic aneurysm)   . Ulcer   . DVT (deep venous thrombosis)   . Cellulitis   . Glaucoma   . Nicotine dependence   . Bronchospasm   . Elevated PSA   . BPH (benign prostatic hyperplasia)   . COPD (chronic obstructive pulmonary disease)    Past Surgical History  Procedure Laterality Date  . Back surgery    . Insitu percutaneous pinning femur    . Cholecystectomy    . Hip surgery Right   . Back surgery  2004   History   Social History  . Marital Status: Married    Spouse Name: N/A    Number of Children: N/A  . Years of Education: N/A   Occupational History  . Not on file.   Social History Main Topics  . Smoking status: Former Smoker    Types: Cigarettes    Quit date: 08/24/2002  . Smokeless tobacco: Current User    Types: Snuff     Comment: dips snuff  . Alcohol Use: No  . Drug Use: No  . Sexually Active: Not on file   Other Topics Concern  . Not on file   Social History Narrative  . No narrative on file   Family History  Problem Relation Age of Onset  .  Aneurysm Brother   . Cancer Father     colon   Current Outpatient Prescriptions on File Prior to Visit  Medication Sig Dispense Refill  . donepezil (ARICEPT) 10 MG tablet Take 1 tablet (10 mg total) by mouth at bedtime as needed.  30 tablet  3  . doxazosin (CARDURA) 8 MG tablet Take 8 mg by mouth at bedtime.      . finasteride (PROSCAR) 5 MG tablet Take 5 mg by mouth daily.      [name] ibuprofen (ADVIL,MOTRIN) 200 MG tablet Take 200 mg by mouth every 6 (six) hours as needed.      . latanoprost (XALATAN) 0.005 % ophthalmic solution At bedtime.      . timolol (BETIMOL) 0.25 % ophthalmic solution Apply 1 drop to eye 2 (two) times daily.      [name] torsemide (DEMADEX) 10 MG tablet Take 10 mg by mouth daily.       No current facility-administered medications on file prior to visit.   Allergies  Allergen Reactions  . Oxycodone Hcl     REACTION: makes pt "wild"  . Propoxyphene-Acetaminophen     REACTION: Makes pt. "wild"   Immunization History  Administered Date(s) Administered  . Pneumococcal Polysaccharide 03/02/2007  . Td 12/30/1996   Prior to Admission medications   Medication Sig Start Date End Date Taking? Authorizing Provider  donepezil (ARICEPT) 10 MG tablet Take 1 tablet (10 mg total) by mouth at bedtime as needed. 10/06/12  Yes Ileana Ladd, MD  doxazosin (CARDURA) 8 MG tablet Take 8 mg by mouth at bedtime.   Yes Historical Provider, MD  finasteride (PROSCAR) 5 MG tablet Take 5 mg by mouth daily.   Yes Historical Provider, MD  ibuprofen (ADVIL,MOTRIN) 200 MG tablet Take 200 mg by mouth every 6 (six) hours as needed.   Yes Historical Provider, MD  latanoprost (XALATAN) 0.005 % ophthalmic solution At bedtime. 01/25/12  Yes Historical Provider, MD  timolol (BETIMOL) 0.25 % ophthalmic solution Apply 1 drop to eye 2 (two) times daily.   Yes Historical Provider, MD  torsemide (DEMADEX) 10 MG tablet Take 10 mg by mouth daily.   Yes Historical Provider, MD    ROS: As above in the HPI. All  other systems are stable or negative.    OBJECTIVE: APPEARANCE:  Patient in no acute distress.The patient appeared well nourished and normally developed. Acyanotic. Waist: VITAL SIGNS:BP 157/74  Pulse 82  Temp(Src) 98.2 F (36.8 C) (Oral)  Wt 324 lb 3.2 oz (147.056 kg)  BMI 41.61 kg/m2  Morbidly Obese WM  SKIN: warm and  Dry . Obvious 2+ edema of the legs with scabbed over healed right leg ulcers and venous  Stasis dermatitis.   HEAD and Neck: without JVD, Head and scalp: normal Eyes:No scleral icterus. Fundi normal, eye movements normal. Ears: Auricle normal, canal normal, Tympanic membranes normal, insufflation normal. Nose: normal Throat: normal Neck & thyroid: normal  CHEST & LUNGS: Chest wall: normal Lungs: Clear  CVS: Reveals the PMI to be normally located. Regular rhythm, First and Second Heart sounds are normal,  absence of murmurs, rubs or gallops. Peripheral vasculature: Radial pulses: normal Dorsal pedis pulses: normal Posterior pulses: normal  ABDOMEN:  Appearance:Obese Benign, no organomegaly, no masses, no Abdominal Aortic enlargement. No Guarding , no rebound. No Bruits. Bowel sounds: normal  RECTAL: N/A GU: N/A  EXTREMETIES: 2+ edematous. Both Femoral and Pedal pulses are normal.  MUSCULOSKELETAL:  Ambulates with a cane with reduced ROM of spine , hips and knees. He walks with a limp.  NEUROLOGIC: oriented to,place and person;   Results for orders placed in visit on 10/06/12  COMPLETE METABOLIC PANEL WITH GFR      Result Value Range   Sodium 142  135 - 145 mEq/L   Potassium 4.3  3.5 - 5.3 mEq/L   Chloride 106  96 - 112 mEq/L   CO2 27  19 - 32 mEq/L   Glucose, Bld 94  70 - 99 mg/dL   BUN 21  6 - 23 mg/dL   Creat 1.61  0.96 - 0.45 mg/dL   Total Bilirubin 0.7  0.3 - 1.2 mg/dL   Alkaline Phosphatase 77  39 - 117 U/L   AST 19  0 - 37 U/L   ALT 15  0 - 53 U/L   Total Protein 6.6  6.0 - 8.3 g/dL   Albumin 4.0  3.5 - 5.2 g/dL   Calcium  9.6  8.4 - 40.9 mg/dL   GFR, Est African American 62     GFR, Est Non African American 54 (*)   LIPID PANEL      Result Value Range   Cholesterol 157  0 -  200 mg/dL   Triglycerides 161  <096 mg/dL   HDL 34 (*) >04 mg/dL   Total CHOL/HDL Ratio 4.6     VLDL 27  0 - 40 mg/dL   LDL Cholesterol 96  0 - 99 mg/dL  TSH      Result Value Range   TSH 1.604  0.350 - 4.500 uIU/mL  VITAMIN B12      Result Value Range   Vitamin B-12 282  211 - 911 pg/mL  FOLATE      Result Value Range   Folate >20.0    POCT CBC      Result Value Range   WBC 7.4  4.6 - 10.2 K/uL   Lymph, poc 2.2  0.6 - 3.4   POC LYMPH PERCENT 30.0  10 - 50 %L   POC Granulocyte 4.6  2 - 6.9   Granulocyte percent 62.5  37 - 80 %G   RBC 4.9  4.69 - 6.13 M/uL   Hemoglobin 14.5  14.1 - 18.1 g/dL   HCT, POC 54.0  98.1 - 53.7 %   MCV 89.1  80 - 97 fL   MCH, POC 29.7  27 - 31.2 pg   MCHC 33.4  31.8 - 35.4 g/dL   RDW, POC 19.1     Platelet Count, POC 204.0  142 - 424 K/uL   MPV 7.7  0 - 99.8 fL    . ASSESSMENT: Dementia, without behavioral disturbance - Plan: donepezil (ARICEPT) 10 MG tablet  Peripheral vascular disease, unspecified  Pedal edema  Obesity, unspecified  COPD (chronic obstructive pulmonary disease)  Chronic venous insufficiency  Cognitive impairment  Abdominal aneurysm without mention of rupture   PLAN: Discussed medications used for Alzheimer's demntia  And the limited successes and failures. However, still recommend trying to preserve the patient's memory and trying to increase the medications to include namenda with the donepezil.  Meds ordered this encounter  Medications  . donepezil (ARICEPT) 10 MG tablet    Sig: Take 1 tablet (10 mg total) by mouth at bedtime.    Dispense:  30 tablet    Refill:  3   Labs reviewed.  Medications reviewed.  Gave 2 complimentary large ACE wraps to start with compression dressing until he can go back to [facility] to purchase his  stockings.   Gave daughter information of [facility] professor's website in regards to dementia  Research , diabetes , etc. Www.PCRM.org Also copies of tiitles of books on nutrition given to daughter to help with nutritional guidelines to change the meals being prepped for patient and to promote health.   Return in about 1 month (around 01/08/2013) for Recheck medical problems.  counselling and  Discussions took 45 minutes.  Sharvi Mooneyhan P. [place] Charon, M.D.

## 2012-12-29 ENCOUNTER — Ambulatory Visit (INDEPENDENT_AMBULATORY_CARE_PROVIDER_SITE_OTHER): Payer: Medicare Other | Admitting: Physician Assistant

## 2012-12-29 VITALS — BP 124/58 | HR 76 | Temp 98.4°F | Ht 74.0 in | Wt 325.0 lb

## 2012-12-29 DIAGNOSIS — L03119 Cellulitis of unspecified part of limb: Secondary | ICD-10-CM

## 2012-12-29 DIAGNOSIS — L03115 Cellulitis of right lower limb: Secondary | ICD-10-CM

## 2012-12-29 DIAGNOSIS — R609 Edema, unspecified: Secondary | ICD-10-CM

## 2012-12-29 DIAGNOSIS — L03116 Cellulitis of left lower limb: Secondary | ICD-10-CM

## 2012-12-29 MED ORDER — SULFAMETHOXAZOLE-TMP DS 800-160 MG PO TABS: 1.0000 | ORAL_TABLET | Freq: Two times a day (BID) | ORAL | Status: DC

## 2012-12-29 MED ORDER — TORSEMIDE 10 MG PO TABS: 10.0000 mg | ORAL_TABLET | Freq: Every day | ORAL | Status: DC

## 2012-12-29 NOTE — Patient Instructions (Signed)
Return to clinic in 1 week for reassessment of UNNA boot. Take all of antibiotic as directed. Wear appropriate sized compression stockings daily after removal of UNNA boot.

## 2012-12-29 NOTE — Progress Notes (Signed)
  Subjective:    Patient ID: [name], male    DOB: [birth year], 79 y.o.   MRN: [id number]  HPI 79 y/o male presents for recurrent peripheral edema of BLE. States that he has had similar episodes in the past which were improved with UNNA boots, antibiotics and diuretics. Comorbidities include PVD & obesity. Accompanied by his wife.     Review of Systems Denies sweats, chills, n/v, fever, numbness/tingling/excessive pain in LE. No drainage, weeping. Endorses increased swelling and redness in LE bilaterally.      Objective:   Physical Exam PE reveals nonpitting peripheral edema in BLE. Erythema with well demarcated borders. No streaking. Warm to the touch. Scaling and lichenification bilaterally. No ulcerations at this time but skin breakdown is evident. VS WNL.         Assessment & Plan:  1. Peripheral Edema bilaterally with Cellulitis: Applied UNNA boots bilaterally and f/u with reassessment in 1 week. Prescribed Bactrim DS BID x 10 days. Take all of medication as prescribed. Also disccussed the importance of wearing compression stockings to help prevent future episodes. Patient states that they will talk to [facility] about appropriate size stockings that should be worn during daytime hours. Refills given for diuretic. F/U in 1 week or sooner if problems occur.

## 2013-01-05 ENCOUNTER — Encounter: Payer: Self-pay | Admitting: Family Medicine

## 2013-01-05 ENCOUNTER — Ambulatory Visit (INDEPENDENT_AMBULATORY_CARE_PROVIDER_SITE_OTHER): Payer: Medicare Other | Admitting: Family Medicine

## 2013-01-05 VITALS — BP 139/63 | HR 69 | Temp 98.1°F | Ht 74.0 in | Wt 324.0 lb

## 2013-01-05 DIAGNOSIS — L0291 Cutaneous abscess, unspecified: Secondary | ICD-10-CM

## 2013-01-05 DIAGNOSIS — L0390 Cellulitis, unspecified: Secondary | ICD-10-CM

## 2013-01-05 NOTE — Progress Notes (Signed)
  Subjective:    Patient ID: [name], male    DOB: Jul 04, 1931, 79 y.o.   MRN: [id number]  HPI This 79 y.o. male presents for evaluation of venous stasis dermatitis and cellulitis of the bilateral lower Extremities.  He is taking abx and has been wearing unnas boots..   Review of Systems No chest pain, SOB, HA, dizziness, vision change, N/V, diarrhea, constipation, dysuria, urinary urgency or frequency, myalgias, arthralgias or rash.     Objective:   Physical Exam Vital signs noted  Well developed well nourished male.  HEENT - Head atraumatic Normocephalic Respiratory - Lungs CTA bilateral Cardiac - RRR S1 and S2 without murmur Extremities - Bilateral LE's with thickened skin due to chronic venous stasis dermatitis. Few patches of erythema.  No blisters.  Mycotic toenails bilateral feet.        Assessment & Plan:  Cellulitis - Plan: Apply unna boot and continue antibiotics and follow up in one week.

## 2013-01-13 ENCOUNTER — Ambulatory Visit (INDEPENDENT_AMBULATORY_CARE_PROVIDER_SITE_OTHER): Payer: Medicare Other | Admitting: Family Medicine

## 2013-01-13 ENCOUNTER — Encounter: Payer: Self-pay | Admitting: Family Medicine

## 2013-01-13 VITALS — BP 147/70 | HR 80 | Temp 97.2°F | Wt 320.0 lb

## 2013-01-13 DIAGNOSIS — I739 Peripheral vascular disease, unspecified: Secondary | ICD-10-CM

## 2013-01-13 DIAGNOSIS — L97911 Non-pressure chronic ulcer of unspecified part of right lower leg limited to breakdown of skin: Secondary | ICD-10-CM

## 2013-01-13 DIAGNOSIS — R609 Edema, unspecified: Secondary | ICD-10-CM

## 2013-01-13 DIAGNOSIS — I872 Venous insufficiency (chronic) (peripheral): Secondary | ICD-10-CM

## 2013-01-13 DIAGNOSIS — R600 Localized edema: Secondary | ICD-10-CM

## 2013-01-13 DIAGNOSIS — L97909 Non-pressure chronic ulcer of unspecified part of unspecified lower leg with unspecified severity: Secondary | ICD-10-CM

## 2013-01-13 DIAGNOSIS — L97919 Non-pressure chronic ulcer of unspecified part of right lower leg with unspecified severity: Secondary | ICD-10-CM | POA: Insufficient documentation

## 2013-01-13 NOTE — Progress Notes (Signed)
Patient ID: [name], male   DOB: 1931/06/25, 79 y.o.   MRN: [id number] SUBJECTIVE: CC: Chief Complaint  Patient presents with  . Follow-up    FOLLOW UP UNNA BOOTS SAW BILL LAST WEEK.STATES UNNA BOOTS BEEN ON FOR 2 WEEKS AND WANTS THEM OFF. HE HAS NOT GOTTEN HIS COMPRESSION STOCKINGS     HPI: Came to recheck leg ulcers. Has chronic stasis dermatitis and venous insufficiency and recurrent leg ulcers. Has had Unna boots applied for 2 weeks now.  Past Medical History  Diagnosis Date  . Venous insufficiency   . AAA (abdominal aortic aneurysm)   . Ulcer   . DVT (deep venous thrombosis)   . Cellulitis   . Glaucoma   . Nicotine dependence   . Bronchospasm   . Elevated PSA   . BPH (benign prostatic hyperplasia)   . COPD (chronic obstructive pulmonary disease)    Past Surgical History  Procedure Laterality Date  . Back surgery    . Insitu percutaneous pinning femur    . Cholecystectomy    . Hip surgery Right   . Back surgery  2004   History   Social History  . Marital Status: Married    Spouse Name: N/A    Number of Children: N/A  . Years of Education: N/A   Occupational History  . Not on file.   Social History Main Topics  . Smoking status: Former Smoker    Types: Cigarettes    Quit date: 08/24/2002  . Smokeless tobacco: Current User    Types: Snuff     Comment: dips snuff  . Alcohol Use: No  . Drug Use: No  . Sexual Activity: Not on file   Other Topics Concern  . Not on file   Social History Narrative  . No narrative on file   Family History  Problem Relation Age of Onset  . Aneurysm Brother   . Cancer Father     colon   Current Outpatient Prescriptions on File Prior to Visit  Medication Sig Dispense Refill  . donepezil (ARICEPT) 10 MG tablet       . doxazosin (CARDURA) 8 MG tablet Take 8 mg by mouth at bedtime.      . finasteride (PROSCAR) 5 MG tablet Take 5 mg by mouth daily.      [name] ibuprofen (ADVIL,MOTRIN) 200 MG tablet Take 200 mg by mouth  every 6 (six) hours as needed.      . latanoprost (XALATAN) 0.005 % ophthalmic solution At bedtime.      . sulfamethoxazole-trimethoprim (BACTRIM DS) 800-160 MG per tablet Take 1 tablet by mouth 2 (two) times daily.  20 tablet  0  . timolol (BETIMOL) 0.25 % ophthalmic solution Apply 1 drop to eye 2 (two) times daily.      [name] torsemide (DEMADEX) 10 MG tablet Take 1 tablet (10 mg total) by mouth daily.  30 tablet  3   No current facility-administered medications on file prior to visit.   Allergies  Allergen Reactions  . Oxycodone Hcl     REACTION: makes pt "wild"  . Propoxyphene-Acetaminophen     REACTION: Makes pt. "wild"   Immunization History  Administered Date(s) Administered  . Pneumococcal Polysaccharide 03/02/2007  . Td 12/30/1996   Prior to Admission medications   Medication Sig Start Date End Date Taking? Authorizing Provider  donepezil (ARICEPT) 10 MG tablet  12/08/12  Yes Historical Provider, MD  doxazosin (CARDURA) 8 MG tablet Take 8 mg by mouth at bedtime.  Yes Historical Provider, MD  finasteride (PROSCAR) 5 MG tablet Take 5 mg by mouth daily.   Yes Historical Provider, MD  ibuprofen (ADVIL,MOTRIN) 200 MG tablet Take 200 mg by mouth every 6 (six) hours as needed.   Yes Historical Provider, MD  latanoprost (XALATAN) 0.005 % ophthalmic solution At bedtime. 01/25/12  Yes Historical Provider, MD  sulfamethoxazole-trimethoprim (BACTRIM DS) 800-160 MG per tablet Take 1 tablet by mouth 2 (two) times daily. 12/29/12  Yes Tiffany Gann, PA-C  timolol (BETIMOL) 0.25 % ophthalmic solution Apply 1 drop to eye 2 (two) times daily.   Yes Historical Provider, MD  torsemide (DEMADEX) 10 MG tablet Take 1 tablet (10 mg total) by mouth daily. 12/29/12  Yes Tiffany Gann, PA-C     ROS: As above in the HPI. All other systems are stable or negative.  OBJECTIVE: APPEARANCE:  Patient in no acute distress.The patient appeared well nourished and normally developed. Acyanotic. Waist: VITAL  SIGNS:BP 147/70  Pulse 80  Temp(Src) 97.2 F (36.2 C) (Oral)  Wt 320 lb (145.151 kg)  BMI 41.07 kg/m2 WM obese.  SKIN: warm and  Dry . Linear leg ulcers bilaterally. No signs of infection. The one on the right has a slow ooze of blood. Hyperpigmentation on legs and VV c/w stasis Dermatitis and venous congestion with the leg ulcers.  HEAD and Neck: without JVD, Head and scalp: normal Eyes:No scleral icterus. Fundi normal, eye movements normal. Ears: Auricle normal, canal normal, Tympanic membranes normal, insufflation normal. Nose: normal Throat: normal Neck & thyroid: normal  CHEST & LUNGS: Chest wall: normal Lungs: Clear  CVS: Reveals the PMI to be normally located. Regular rhythm, First and Second Heart sounds are normal,  absence of murmurs, rubs or gallops. Peripheral vasculature: Radial pulses: normal Dorsal pedis pulses: normal Posterior pulses: normal  ABDOMEN:  Appearance: obese Benign, no organomegaly, no masses, no Abdominal Aortic enlargement. No Guarding , no rebound. No Bruits. Bowel sounds: normal  EXTREMETIES: 3+edematous.  ASSESSMENT: Ulcers of both lower legs, limited to breakdown of skin  Chronic venous insufficiency  Pedal edema  Peripheral vascular disease, unspecified  PLAN: UNNA boots applied to both legs.  Return in about 1 week (around 01/20/2013) for recheck Unna boots.Thelma Barge P. [place] Charon, M.D.

## 2013-01-19 ENCOUNTER — Encounter: Payer: Self-pay | Admitting: Family Medicine

## 2013-01-19 ENCOUNTER — Ambulatory Visit (INDEPENDENT_AMBULATORY_CARE_PROVIDER_SITE_OTHER): Payer: Medicare Other | Admitting: Family Medicine

## 2013-01-19 ENCOUNTER — Telehealth: Payer: Self-pay | Admitting: Family Medicine

## 2013-01-19 VITALS — BP 141/66 | HR 76 | Temp 98.1°F | Wt 325.0 lb

## 2013-01-19 DIAGNOSIS — I872 Venous insufficiency (chronic) (peripheral): Secondary | ICD-10-CM

## 2013-01-19 DIAGNOSIS — I714 Abdominal aortic aneurysm, without rupture, unspecified: Secondary | ICD-10-CM

## 2013-01-19 DIAGNOSIS — R600 Localized edema: Secondary | ICD-10-CM

## 2013-01-19 DIAGNOSIS — L97911 Non-pressure chronic ulcer of unspecified part of right lower leg limited to breakdown of skin: Secondary | ICD-10-CM

## 2013-01-19 DIAGNOSIS — F0390 Unspecified dementia without behavioral disturbance: Secondary | ICD-10-CM

## 2013-01-19 DIAGNOSIS — E669 Obesity, unspecified: Secondary | ICD-10-CM

## 2013-01-19 DIAGNOSIS — L97909 Non-pressure chronic ulcer of unspecified part of unspecified lower leg with unspecified severity: Secondary | ICD-10-CM

## 2013-01-19 DIAGNOSIS — R4189 Other symptoms and signs involving cognitive functions and awareness: Secondary | ICD-10-CM

## 2013-01-19 DIAGNOSIS — F09 Unspecified mental disorder due to known physiological condition: Secondary | ICD-10-CM

## 2013-01-19 DIAGNOSIS — R609 Edema, unspecified: Secondary | ICD-10-CM

## 2013-01-19 DIAGNOSIS — I739 Peripheral vascular disease, unspecified: Secondary | ICD-10-CM

## 2013-01-19 NOTE — Progress Notes (Signed)
Patient ID: [name], male   DOB: [birth year], 79 y.o.   MRN: [id number] SUBJECTIVE: CC: Chief Complaint  Patient presents with  . Follow-up    1 week follow  up cellulitis .pt concerned about them not healing    HPI: Here for follow up of bilateral leg ulcerations. Concerned about the slow healing.  Past Medical History  Diagnosis Date  . Venous insufficiency   . AAA (abdominal aortic aneurysm)   . Ulcer   . DVT (deep venous thrombosis)   . Cellulitis   . Glaucoma   . Nicotine dependence   . Bronchospasm   . Elevated PSA   . BPH (benign prostatic hyperplasia)   . COPD (chronic obstructive pulmonary disease)    Past Surgical History  Procedure Laterality Date  . Back surgery    . Insitu percutaneous pinning femur    . Cholecystectomy    . Hip surgery Right   . Back surgery  2004   History   Social History  . Marital Status: Married    Spouse Name: N/A    Number of Children: N/A  . Years of Education: N/A   Occupational History  . Not on file.   Social History Main Topics  . Smoking status: Former Smoker    Types: Cigarettes    Quit date: 08/24/2002  . Smokeless tobacco: Current User    Types: Snuff     Comment: dips snuff  . Alcohol Use: No  . Drug Use: No  . Sexual Activity: Not on file   Other Topics Concern  . Not on file   Social History Narrative  . No narrative on file   Family History  Problem Relation Age of Onset  . Aneurysm Brother   . Cancer Father     colon   Current Outpatient Prescriptions on File Prior to Visit  Medication Sig Dispense Refill  . donepezil (ARICEPT) 10 MG tablet       . doxazosin (CARDURA) 8 MG tablet Take 8 mg by mouth at bedtime.      . finasteride (PROSCAR) 5 MG tablet Take 5 mg by mouth daily.      [name] ibuprofen (ADVIL,MOTRIN) 200 MG tablet Take 200 mg by mouth every 6 (six) hours as needed.      . latanoprost (XALATAN) 0.005 % ophthalmic solution At bedtime.      . sulfamethoxazole-trimethoprim (BACTRIM DS)  800-160 MG per tablet Take 1 tablet by mouth 2 (two) times daily.  20 tablet  0  . timolol (BETIMOL) 0.25 % ophthalmic solution Apply 1 drop to eye 2 (two) times daily.      [name] torsemide (DEMADEX) 10 MG tablet Take 1 tablet (10 mg total) by mouth daily.  30 tablet  3   No current facility-administered medications on file prior to visit.   Allergies  Allergen Reactions  . Oxycodone Hcl     REACTION: makes pt "wild"  . Propoxyphene-Acetaminophen     REACTION: Makes pt. "wild"   Immunization History  Administered Date(s) Administered  . Pneumococcal Polysaccharide 03/02/2007  . Td 12/30/1996   Prior to Admission medications   Medication Sig Start Date End Date Taking? Authorizing Provider  donepezil (ARICEPT) 10 MG tablet  12/08/12  Yes Historical Provider, MD  doxazosin (CARDURA) 8 MG tablet Take 8 mg by mouth at bedtime.   Yes Historical Provider, MD  finasteride (PROSCAR) 5 MG tablet Take 5 mg by mouth daily.   Yes Historical Provider, MD  ibuprofen (ADVIL,MOTRIN)  200 MG tablet Take 200 mg by mouth every 6 (six) hours as needed.   Yes Historical Provider, MD  latanoprost (XALATAN) 0.005 % ophthalmic solution At bedtime. 01/25/12  Yes Historical Provider, MD  sulfamethoxazole-trimethoprim (BACTRIM DS) 800-160 MG per tablet Take 1 tablet by mouth 2 (two) times daily. 12/29/12  Yes Tiffany Gann, PA-C  timolol (BETIMOL) 0.25 % ophthalmic solution Apply 1 drop to eye 2 (two) times daily.   Yes Historical Provider, MD  torsemide (DEMADEX) 10 MG tablet Take 1 tablet (10 mg total) by mouth daily. 12/29/12  Yes Tiffany Gann, PA-C     ROS: As above in the HPI. All other systems are stable or negative.  OBJECTIVE: APPEARANCE:  Patient in no acute distress.The patient appeared well nourished and normally developed. Acyanotic. Waist: VITAL SIGNS:BP 141/66  Pulse 76  Temp(Src) 98.1 F (36.7 C) (Oral)  Wt 325 lb (147.419 kg)  BMI 41.71 kg/m2  Obese WM   SKIN: warm and  Dry. Linear  ulcerations on both legs look better. Discoloration from the stasis dermatitis. No signs of infection.  HEAD and Neck: without JVD, Head and scalp: normal Eyes:No scleral icterus. Fundi normal, eye movements normal. Ears: Auricle normal, canal normal, Tympanic membranes normal, insufflation normal. Nose: normal Throat: normal Neck & thyroid: normal  CHEST & LUNGS: Chest wall: normal Lungs: Clear  CVS: Reveals the PMI to be normally located. Regular rhythm, First and Second Heart sounds are normal,  absence of murmurs, rubs or gallops. Peripheral vasculature: Radial pulses: normal Dorsal pedis pulses: normal Posterior pulses: normal  ABDOMEN:  Appearance: Obes Benign, no organomegaly, no masses, no Abdominal Aortic enlargement. No Guarding , no rebound. No Bruits. Bowel sounds: normal  RECTAL: N/A GU: N/A  EXTREMETIES: 3 + edematous. From venous  Stasis.  Results for orders placed in visit on 10/06/12  COMPLETE METABOLIC PANEL WITH GFR      Result Value Range   Sodium 142  135 - 145 mEq/L   Potassium 4.3  3.5 - 5.3 mEq/L   Chloride 106  96 - 112 mEq/L   CO2 27  19 - 32 mEq/L   Glucose, Bld 94  70 - 99 mg/dL   BUN 21  6 - 23 mg/dL   Creat 1.61  0.96 - 0.45 mg/dL   Total Bilirubin 0.7  0.3 - 1.2 mg/dL   Alkaline Phosphatase 77  39 - 117 U/L   AST 19  0 - 37 U/L   ALT 15  0 - 53 U/L   Total Protein 6.6  6.0 - 8.3 g/dL   Albumin 4.0  3.5 - 5.2 g/dL   Calcium 9.6  8.4 - 40.9 mg/dL   GFR, Est African American 62     GFR, Est Non African American 54 (*)   LIPID PANEL      Result Value Range   Cholesterol 157  0 - 200 mg/dL   Triglycerides 811  <914 mg/dL   HDL 34 (*) >78 mg/dL   Total CHOL/HDL Ratio 4.6     VLDL 27  0 - 40 mg/dL   LDL Cholesterol 96  0 - 99 mg/dL  TSH      Result Value Range   TSH 1.604  0.350 - 4.500 uIU/mL  VITAMIN B12      Result Value Range   Vitamin B-12 282  211 - 911 pg/mL  FOLATE      Result Value Range   Folate >20.0    POCT  CBC  Result Value Range   WBC 7.4  4.6 - 10.2 K/uL   Lymph, poc 2.2  0.6 - 3.4   POC LYMPH PERCENT 30.0  10 - 50 %L   POC Granulocyte 4.6  2 - 6.9   Granulocyte percent 62.5  37 - 80 %G   RBC 4.9  4.69 - 6.13 M/uL   Hemoglobin 14.5  14.1 - 18.1 g/dL   HCT, POC 16.1  09.6 - 53.7 %   MCV 89.1  80 - 97 fL   MCH, POC 29.7  27 - 31.2 pg   MCHC 33.4  31.8 - 35.4 g/dL   RDW, POC 04.5     Platelet Count, POC 204.0  142 - 424 K/uL   MPV 7.7  0 - 99.8 fL    ASSESSMENT: Chronic venous insufficiency  Peripheral vascular disease, unspecified  Ulcers of both lower legs, limited to breakdown of skin  Pedal edema  AAA (abdominal aortic aneurysm) without rupture  Cognitive impairment  Dementia  Obesity, unspecified   PLAN: Unna boots applied again bilaterally.  Return in about 1 week (around 01/26/2013) for unna boot replacement.  Tinisha Etzkorn P. [place] Charon, M.D.

## 2013-01-19 NOTE — Telephone Encounter (Signed)
Appt rescheduled for today. His legs are weeping through the dressing.

## 2013-01-20 ENCOUNTER — Ambulatory Visit: Payer: Medicare Other | Admitting: Family Medicine

## 2013-01-26 ENCOUNTER — Ambulatory Visit (INDEPENDENT_AMBULATORY_CARE_PROVIDER_SITE_OTHER): Payer: Medicare Other | Admitting: Family Medicine

## 2013-01-26 ENCOUNTER — Ambulatory Visit (INDEPENDENT_AMBULATORY_CARE_PROVIDER_SITE_OTHER): Payer: Medicare Other

## 2013-01-26 ENCOUNTER — Encounter: Payer: Self-pay | Admitting: Family Medicine

## 2013-01-26 VITALS — BP 133/70 | HR 104 | Temp 98.7°F | Wt 329.0 lb

## 2013-01-26 DIAGNOSIS — I739 Peripheral vascular disease, unspecified: Secondary | ICD-10-CM

## 2013-01-26 DIAGNOSIS — L97909 Non-pressure chronic ulcer of unspecified part of unspecified lower leg with unspecified severity: Secondary | ICD-10-CM

## 2013-01-26 DIAGNOSIS — R609 Edema, unspecified: Secondary | ICD-10-CM

## 2013-01-26 DIAGNOSIS — M25561 Pain in right knee: Secondary | ICD-10-CM

## 2013-01-26 DIAGNOSIS — L97911 Non-pressure chronic ulcer of unspecified part of right lower leg limited to breakdown of skin: Secondary | ICD-10-CM

## 2013-01-26 DIAGNOSIS — R600 Localized edema: Secondary | ICD-10-CM

## 2013-01-26 DIAGNOSIS — M25569 Pain in unspecified knee: Secondary | ICD-10-CM | POA: Insufficient documentation

## 2013-01-26 DIAGNOSIS — I872 Venous insufficiency (chronic) (peripheral): Secondary | ICD-10-CM

## 2013-01-26 NOTE — Progress Notes (Signed)
Patient ID: [name], male   DOB: 10-15-1931, 79 y.o.   MRN: [id number] SUBJECTIVE: CC: Chief Complaint  Patient presents with  . Follow-up    reck legs wants lrt leg xr'd     HPI: Here for follow up lower leg ulcerations from severe stasis dermatitis. Having pain in the right mid-shin and he thinks the leg is a little crooked and he had Fx it in the past.he would like to have the leg x-rayed.  Past Medical History  Diagnosis Date  . Venous insufficiency   . AAA (abdominal aortic aneurysm)   . Ulcer   . DVT (deep venous thrombosis)   . Cellulitis   . Glaucoma   . Nicotine dependence   . Bronchospasm   . Elevated PSA   . BPH (benign prostatic hyperplasia)   . COPD (chronic obstructive pulmonary disease)    Past Surgical History  Procedure Laterality Date  . Back surgery    . Insitu percutaneous pinning femur    . Cholecystectomy    . Hip surgery Right   . Back surgery  2004   History   Social History  . Marital Status: Married    Spouse Name: N/A    Number of Children: N/A  . Years of Education: N/A   Occupational History  . Not on file.   Social History Main Topics  . Smoking status: Former Smoker    Types: Cigarettes    Quit date: 08/24/2002  . Smokeless tobacco: Current User    Types: Snuff     Comment: dips snuff  . Alcohol Use: No  . Drug Use: No  . Sexual Activity: Not on file   Other Topics Concern  . Not on file   Social History Narrative  . No narrative on file   Family History  Problem Relation Age of Onset  . Aneurysm Brother   . Cancer Father     colon   Current Outpatient Prescriptions on File Prior to Visit  Medication Sig Dispense Refill  . donepezil (ARICEPT) 10 MG tablet       . doxazosin (CARDURA) 8 MG tablet Take 8 mg by mouth at bedtime.      . finasteride (PROSCAR) 5 MG tablet Take 5 mg by mouth daily.      [name] ibuprofen (ADVIL,MOTRIN) 200 MG tablet Take 200 mg by mouth every 6 (six) hours as needed.      . latanoprost  (XALATAN) 0.005 % ophthalmic solution At bedtime.      . sulfamethoxazole-trimethoprim (BACTRIM DS) 800-160 MG per tablet Take 1 tablet by mouth 2 (two) times daily.  20 tablet  0  . timolol (BETIMOL) 0.25 % ophthalmic solution Apply 1 drop to eye 2 (two) times daily.      [name] torsemide (DEMADEX) 10 MG tablet Take 1 tablet (10 mg total) by mouth daily.  30 tablet  3   No current facility-administered medications on file prior to visit.   Allergies  Allergen Reactions  . Oxycodone Hcl     REACTION: makes pt "wild"  . Propoxyphene-Acetaminophen     REACTION: Makes pt. "wild"   Immunization History  Administered Date(s) Administered  . Pneumococcal Polysaccharide 03/02/2007  . Td 12/30/1996   Prior to Admission medications   Medication Sig Start Date End Date Taking? Authorizing Provider  donepezil (ARICEPT) 10 MG tablet  12/08/12   Historical Provider, MD  doxazosin (CARDURA) 8 MG tablet Take 8 mg by mouth at bedtime.    Historical  Provider, MD  finasteride (PROSCAR) 5 MG tablet Take 5 mg by mouth daily.    Historical Provider, MD  ibuprofen (ADVIL,MOTRIN) 200 MG tablet Take 200 mg by mouth every 6 (six) hours as needed.    Historical Provider, MD  latanoprost (XALATAN) 0.005 % ophthalmic solution At bedtime. 01/25/12   Historical Provider, MD  sulfamethoxazole-trimethoprim (BACTRIM DS) 800-160 MG per tablet Take 1 tablet by mouth 2 (two) times daily. 12/29/12   Tiffany Gann, PA-C  timolol (BETIMOL) 0.25 % ophthalmic solution Apply 1 drop to eye 2 (two) times daily.    Historical Provider, MD  torsemide (DEMADEX) 10 MG tablet Take 1 tablet (10 mg total) by mouth daily. 12/29/12   Tiffany Gann, PA-C     ROS: As above in the HPI. All other systems are stable or negative.  OBJECTIVE: APPEARANCE:  Patient in no acute distress.The patient appeared well nourished and normally developed. Acyanotic. Waist: VITAL SIGNS:BP 133/70  Pulse 104  Temp(Src) 98.7 F (37.1 C) (Oral)  Wt 329 lb  (149.233 kg)  BMI 42.22 kg/m2 WM obese Few blleding points on both legs. But the ulcerations have healed. Edema is 50 % improved. Leg cleaned and dressed at the tiny bleeding points with gauze and a stockinette and ace wraps.  SKIN: warm and  Dry.   HEAD and Neck: without JVD, Head and scalp: normal Eyes:No scleral icterus. Fundi normal, eye movements normal. Ears: Auricle normal, canal normal, Tympanic membranes normal, insufflation normal. Nose: normal Throat: normal Neck & thyroid: normal  CHEST & LUNGS: Chest wall: normal Lungs: Clear  CVS: Reveals the PMI to be normally located. Regular rhythm, First and Second Heart sounds are normal,  absence of murmurs, rubs or gallops. Peripheral vasculature: Radial pulses: normal Dorsal pedis pulses: normal Posterior pulses: normal  ABDOMEN:  Appearance: Obese Benign, no organomegaly, no masses, no Abdominal Aortic enlargement. No Guarding , no rebound. No Bruits. Bowel sounds: normal  RECTAL: N/A GU: N/A  EXTREMETIES: 2+ edema  MUSCULOSKELETAL:  Tender right shin, midshaft. Slight varus deformity of the right tibia.  NEUROLOGIC: oriented to ,place and person; nonfocal.  ASSESSMENT: Pain in joint, lower leg, right - Plan: DG Tibia/Fibula Right  Chronic venous insufficiency  Peripheral vascular disease, unspecified  Ulcers of both lower legs, limited to breakdown of skin  Pedal edema  Pain in the right shin secondary to shin splints from limping and walking with pronation of the right foot.  PLAN:  [facility] reading (PRIMARY) by  Dr. Modesto Charon : no fracture seen.                          Recommend shoe inserts to correct the pronation as a trial. Consider orthopedist referral if no resolution.  Orders Placed This Encounter  Procedures  . DG Tibia/Fibula Right    Standing Status: Future     Number of Occurrences: 1     Standing Expiration Date: 03/28/2014    Order Specific Question:  Reason for Exam (SYMPTOM  OR  DIAGNOSIS REQUIRED)    Answer:  shin pain, r/o stress fracture.    Order Specific Question:  Preferred imaging location?    Answer:  Internal    The bleeding points were covered with gauze and a stockinette applied.and ACE wraps applied.  Return in about 2 months (around 03/28/2013) for Recheck medical problems.  Dayn Barich P. Modesto Charon, M.D.

## 2013-02-14 ENCOUNTER — Other Ambulatory Visit: Payer: Self-pay | Admitting: *Deleted

## 2013-02-14 DIAGNOSIS — I714 Abdominal aortic aneurysm, without rupture, unspecified: Secondary | ICD-10-CM

## 2013-03-06 ENCOUNTER — Encounter: Payer: Self-pay | Admitting: Family

## 2013-03-07 ENCOUNTER — Encounter: Payer: Self-pay | Admitting: Family

## 2013-03-07 ENCOUNTER — Ambulatory Visit (INDEPENDENT_AMBULATORY_CARE_PROVIDER_SITE_OTHER): Payer: Medicare Other | Admitting: Family

## 2013-03-07 ENCOUNTER — Ambulatory Visit (HOSPITAL_COMMUNITY)
Admission: RE | Admit: 2013-03-07 | Discharge: 2013-03-07 | Disposition: A | Discharge status: Home / Self Care | Payer: Medicare Other | Source: Ambulatory Visit | Attending: Vascular Surgery | Admitting: Vascular Surgery

## 2013-03-07 VITALS — BP 115/67 | HR 68 | Resp 16 | Ht 78.0 in | Wt 329.0 lb

## 2013-03-07 DIAGNOSIS — I714 Abdominal aortic aneurysm, without rupture, unspecified: Secondary | ICD-10-CM | POA: Insufficient documentation

## 2013-03-07 DIAGNOSIS — R609 Edema, unspecified: Secondary | ICD-10-CM | POA: Insufficient documentation

## 2013-03-07 DIAGNOSIS — M79609 Pain in unspecified limb: Secondary | ICD-10-CM | POA: Insufficient documentation

## 2013-03-07 HISTORY — DX: Edema, unspecified: R60.9

## 2013-03-07 NOTE — Progress Notes (Signed)
[facility] [place]  Established Abdominal Aortic Aneurysm  History of Present Illness  [name] is a 79 y.o. (1932-03-18) male who presents for evaluation of lower extremity venous stasis disease. He is well known to Dr. Arbie Cookey from prior followup of his infrarenal aneurysm. He is not using compression stockings but he is using ace wraps 24/7, does not take off at night; his PCP assists with UNA boot at times when he develops venous stasis ulcers. The patient does have chronic back pain but no acute onset, no abdominal pain. The patient is not a smoker, stopped years ago, but has taken up oral snuff for the last 5 years. The patient has pain in his legs with walking, he is evasive when asked about pain in legs, but the legs discomfort seems more related to his venous stasis and pressure in his legs than arterial claudication.  Pt Diabetic: No Pt smoker: non-smoker, oral snuff for 4-5 years  Past Medical History  Diagnosis Date  . Venous insufficiency   . AAA (abdominal aortic aneurysm)   . Ulcer   . DVT (deep venous thrombosis)   . Cellulitis   . Glaucoma   . Nicotine dependence   . Bronchospasm   . Elevated PSA   . BPH (benign prostatic hyperplasia)   . COPD (chronic obstructive pulmonary disease)    Past Surgical History  Procedure Laterality Date  . Back surgery    . Insitu percutaneous pinning femur    . Cholecystectomy    . Hip surgery Right   . Back surgery  2004   Social History History   Social History  . Marital Status: Married    Spouse Name: N/A    Number of Children: N/A  . Years of Education: N/A   Occupational History  . Not on file.   Social History Main Topics  . Smoking status: Former Smoker    Types: Cigarettes    Quit date: 08/24/2002  . Smokeless tobacco: Current User    Types: Snuff     Comment: dips snuff  . Alcohol Use: No  . Drug Use: No  . Sexual Activity: Not on file   Other Topics Concern  . Not on file    Social History Narrative  . No narrative on file   Family History Family History  Problem Relation Age of Onset  . Aneurysm Brother   . Cancer Father     colon    Current Outpatient Prescriptions on File Prior to Visit  Medication Sig Dispense Refill  . donepezil (ARICEPT) 10 MG tablet       . doxazosin (CARDURA) 8 MG tablet Take 8 mg by mouth at bedtime.      . finasteride (PROSCAR) 5 MG tablet Take 5 mg by mouth daily.      [name] ibuprofen (ADVIL,MOTRIN) 200 MG tablet Take 200 mg by mouth every 6 (six) hours as needed.      . latanoprost (XALATAN) 0.005 % ophthalmic solution At bedtime.      . sulfamethoxazole-trimethoprim (BACTRIM DS) 800-160 MG per tablet Take 1 tablet by mouth 2 (two) times daily.  20 tablet  0  . timolol (BETIMOL) 0.25 % ophthalmic solution Apply 1 drop to eye 2 (two) times daily.      [name] torsemide (DEMADEX) 10 MG tablet Take 1 tablet (10 mg total) by mouth daily.  30 tablet  3   No current facility-administered medications on file prior to visit.   Allergies  Allergen Reactions  . Oxycodone Hcl     REACTION: makes pt "wild"  . Propoxyphene-Acetaminophen     REACTION: Makes pt. "wild"    ROS: [x]  Positive   [ ]  Negative   [ ]  All sytems reviewed and are negative  General: [ ]  Weight loss, [ ]  Fever, [ ]  chills Neurologic: [ ]  Dizziness, [ ]  Blackouts, [ ]  Seizure [ ]  Stroke, [ ]  "Mini stroke", [ ]  Slurred speech, [ ]  Temporary blindness; [ ]  weakness in arms or legs, [ ]  Hoarseness Cardiac: [ ]  Chest pain/pressure, [ ]  Shortness of breath at rest [ ]  Shortness of breath with exertion, [ ]  Atrial fibrillation or irregular heartbeat Vascular: [ ]  Pain in legs with walking, [ ]  Pain in legs at rest, [ ]  Pain in legs at night,  [ ]  Non-healing ulcer, [ ]  Blood clot in vein/DVT,   Pulmonary: [ ]  Home oxygen, [ ]  Productive cough, [ ]  Coughing up blood, [ ]  Asthma,  [ ]  Wheezing Musculoskeletal:  [ ]  Arthritis, [ ]  Low back pain, [ ]  Joint pain Hematologic: [  ] Easy Bruising, [ ]  Anemia; [ ]  Hepatitis Gastrointestinal: [ ]  Blood in stool, [ ]  Gastroesophageal Reflux/heartburn, [ ]  Trouble swallowing Urinary: [ ]  chronic Kidney disease, [ ]  on HD - [ ]  MWF or [ ]  TTHS, [ ]  Burning with urination, [ ]  Difficulty urinating Skin: [ ]  Rashes, [ ]  Wounds Psychological: [ ]  Anxiety, [ ]  Depression  Physical Examination  Filed Vitals:   03/07/13 1016  BP: 115/67  Pulse: 68  Resp: 16   Filed Weights   03/07/13 1016  Weight: 329 lb (149.233 kg)   Body mass index is 38.03 kg/(m^2).  General: A&O x 3, WD, Obese, using cane.  Pulmonary: Sym exp, good air movt, CTAB, no rales, rhonchi, or wheezing.  Cardiac: RRR, Nl S1, S2, no Murmurs, rubs or gallops.  Carotid Bruits Left Right   Negative Negative   Aorta is not palpable Radial pulses are 2+ palpable.                          VASCULAR EXAM:                                                                                                         LE Pulses LEFT RIGHT       POPLITEAL  not palpable   not palpable       POSTERIOR TIBIAL  not palpable   not palpable        DORSALIS PEDIS      ANTERIOR TIBIAL  palpable   palpable      Gastrointestinal: soft, NTND, -G/R, - HSM, - masses, large pannus.  Musculoskeletal: M/S 5/5 in upper extremities, 4/5 in lower extremities, Extremities without ischemic changes. He does have chronic venous stasis changes in both LE's: darkened and leathery skin, dry flaking skin, no venous stasis ulcers. Thickened heel calloses.  Neurologic: CN 2-12 intact, Pain and light touch intact in  extremities, Motor exam as listed above.  Non-Invasive Vascular Imaging  AAA Duplex (03/07/2013)  Previous size: 3.5 cm (Date: 03/01/2012)  Current size:  4.28 cm (Date: 03/07/2013)  Medical Decision Making  The patient is a 79 y.o. male who presents with aymptomatic AAA with 0.78 cm increase in size in 1 year. His severe chronic venous insufficiency is exacerbated  by his size and lack of mobility, but he currently has no stasis ulcers.  I encouraged him to perform chair exercises several times daily with his legs and demonstrated leg lifts, knee and ankle flexions, heel to toe lifts, and ankle rotations. He was advised to elevate his legs to hip level, preferably above his heart level, when sitting.   Based on this patient's exam and diagnostic studies, the patient will follow up in 1 year  with the following studies: AAA Duplex.  The threshold for repair is AAA size > 5.5 cm, growth > 1 cm/yr, and symptomatic status.  I emphasized the importance of maximal medical management including strict control of blood pressure, blood glucose, and lipid levels, antiplatelet agents, obtaining regular exercise, and cessation of smoking.   The patient was given information about AAA including signs, symptoms, treatment, and how to minimize the risk of enlargement and rupture of aneurysms.    The patient was advised to call 911 should the patient experience sudden onset abdominal or back pain.   Thank you for allowing [place] to participate in this patient's care.  Charisse March, RN, MSN, FNP-C [facility] Office: [phone]  Clinic Physician: Early  03/07/2013, 9:18 AM

## 2013-03-07 NOTE — Patient Instructions (Addendum)
Smokeless Tobacco Use Smokeless tobacco is a loose, fine, or stringy tobacco. The tobacco is not smoked like a cigarette, but it is chewed or held in the lips or cheeks. It resembles tea and comes from the leaves of the tobacco plant. Smokeless tobacco is usually flavored, sweetened, or processed in some way. Although smokeless tobacco is not smoked into the lungs, its chemicals are absorbed through the membranes in the mouth and into the bloodstream. Its chemicals are also swallowed in saliva. The chemicals (nicotine and other toxins) are known to cause cancer. Smokeless tobacco contains up to 28 differentcarcinogens. CAUSES Nicotine is addictive. Smokeless tobacco contains nicotine, which is a stimulant. This stimulant can give you a "buzz" or altered state. People can become addicted to the feeling it delivers.  SYMPTOMS Smokeless tobacco can cause health problems, including:  Bad breath.  Yellow-brown teeth.  Mouth sores.  Cracking and bleeding lips.  Gum disease, gum recession, and bone loss around the teeth.  Tooth decay.  Increased or irregular heart rate.  High blood pressure, heart disease, and stroke.  Cancer of the mouth, lips, tongue, pancreas, voice box (larynx), esophagus, colon, and bladder.  Precancerous lesion of the soft tissues of the mouth (leukoplakia).  Loss of your sense of taste. TREATMENT Talk with your caregiver about ways you can quit. Quitting tobacco is a good decision for your health. Nicotine is addictive, but several options are available to help you quit including:  Nicotine replacement therapy (gum or patch).  Support and cessation programs. The following tips can help you quit:  Write down the reasons you would like to quit and look at them often.  Set a date during a low stress time to stop or cut back.  Ask family and friends for their support.  Remove all tobacco products from your home and work.  Replace the chewing tobacco with  things like beef jerky, sunflower seeds, or shredded coconut.  Avoid situations that may make you want to chew tobacco.  Exercise and eat a healthy diet.  When you crave tobacco, distract yourself with drinking water, sugarless chewing gum, sugarless hard candy, exercising, or deep breathing. HOME CARE INSTRUCTIONS  See your dentist for regular oral health exams every 6 months.  Follow up with your caregiver as recommended. SEEK MEDICAL CARE OR DENTAL CARE IF:  You have bleeding or cracking lips, gums, or cheeks.  You have mouth sores, discolorations, or pain.  You have tooth pain.  You develop persistent irritation, burning, or sores in the mouth.  You have pain, tenderness, or numbness in the mouth.  You develop a lump, bumpy patch, or hardened skin inside the mouth.  The color changes inside your mouth (gray, white, or red spots).  You have difficulty chewing, swallowing, or speaking. Document Released: 10/20/2010 Document Revised: 08/10/2011 Document Reviewed: 10/20/2010 [facility] Patient Information 2014 [place], [place].  Venous Stasis and Chronic Venous Insufficiency As people age, the veins located in their legs may weaken and stretch. When veins weaken and lose the ability to pump blood effectively, the condition is called chronic venous insufficiency (CVI) or venous stasis. Almost all veins return blood back to the heart. This happens by:  The force of the heart pumping fresh blood pushes blood back to the heart.  Blood flowing to the heart from the force of gravity. In the deep veins of the legs, blood has to fight gravity and flow upstream back to the heart. Here, the leg muscles contract to pump blood back toward  the heart. Vein walls are elastic, and many veins have small valves that only allow blood to flow in one direction. When leg muscles contract, they push inward against the elastic vein walls. This squeezes blood upward, opens the valves, and moves blood  toward the heart. When leg muscles relax, the vein wall also relaxes and the valves inside the vein close to prevent blood from flowing backward. This method of pumping blood out of the legs is called the venous pump. CAUSES  The venous pump works best while walking and leg muscles are contracting. But when a person sits or stands, blood pressure in leg veins can build. Deep veins are usually able to withstand short periods of inactivity, but long periods of inactivity (and increased pressure) can stretch, weaken, and damage vein walls. High blood pressure can also stretch and damage vein walls. The veins may no longer be able to pump blood back to the heart. Venous hypertension (high blood pressure inside veins) that lasts over time is a primary cause of CVI. CVI can also be caused by:   Deep vein thrombosis, a condition where a thrombus (blood clot) blocks blood flow in a vein.  Phlebitis, an inflammation of a superficial vein that causes a blood clot to form. Other risk factors for CVI may include:   Heredity.  Obesity.  Pregnancy.  Sedentary lifestyle.  Smoking.  Jobs requiring long periods of standing or sitting in one place.  Age and gender:  Women in their 66's and 49's and men in their 77's are more prone to developing CVI. SYMPTOMS  Symptoms of CVI may include:   Varicose veins.  Ulceration or skin breakdown.  Lipodermatosclerosis, a condition that affects the skin just above the ankle, usually on the inside surface. Over time the skin becomes brown, smooth, tight and often painful. Those with this condition have a high risk of developing skin ulcers.  Reddened or discolored skin on the leg.  Swelling. DIAGNOSIS  Your caregiver can diagnose CVI after performing a careful medical history and physical examination. To confirm the diagnosis, the following tests may also be ordered:   Duplex ultrasound.  Plethysmography (tests blood flow).  Venograms (x-ray using a  special dye). TREATMENT The goals of treatment for CVI are to restore a person to an active life and to minimize pain or disability. Typically, CVI does not pose a serious threat to life or limb, and with proper treatment most people with this condition can continue to lead active lives. In most cases, mild CVI can be treated on an outpatient basis with simple procedures. Treatment methods include:   Elastic compression socks.  Sclerotherapy, a procedure involving an injection of a material that "dissolves" the damaged veins. Other veins in the network of blood vessels take over the function of the damaged veins.  Vein stripping (an older procedure less commonly used).  Laser Ablation surgery.  Valve repair. HOME CARE INSTRUCTIONS   Elastic compression socks must be worn every day. They can help with symptoms and lower the chances of the problem getting worse, but they do not cure the problem.  Only take over-the-counter or prescription medicines for pain, discomfort, or fever as directed by your caregiver.  Your caregiver will review your other medications with you. SEEK MEDICAL CARE IF:   You are confused about how to take your medications.  There is redness, swelling, or increasing pain in the affected area.  There is a red streak or line that extends up or down  from the affected area.  There is a breakdown or loss of skin in the affected area, even if the breakdown is small.  You develop an unexplained oral temperature above 102 F (38.9 C).  There is an injury to the affected area. SEEK IMMEDIATE MEDICAL CARE IF:   There is an injury and open wound to the affected area.  Pain is not adequately relieved with pain medication prescribed or becomes severe.  An oral temperature above 102 F (38.9 C) develops.  The foot/ankle below the affected area becomes suddenly numb or the area feels weak and hard to move. MAKE SURE YOU:   Understand these instructions.  Will watch  your condition.  Will get help right away if you are not doing well or get worse. Document Released: 09/21/2006 Document Revised: 08/10/2011 Document Reviewed: 11/29/2006 [facility] Patient Information 2014 [place], [place].   Abdominal Aortic Aneurysm  An aneurysm is the enlargement (dilatation), bulging, or ballooning out of part of the wall of a vein or artery. An aortic aneurysm is a bulging in the largest artery of the body. This artery supplies blood from the heart to the rest of the body.  The first part of the aorta is called the thoracic aorta. It leaves the heart, rises (ascends), arches, and goes down (descends) through the chest until it reaches the diaphragm. The diaphragm is the muscular part between the chest and abdomen.  The second part of the aorta is called the abdominal aorta after it has passed the diaphragm and continues down through the abdomen. The abdominal aorta ends where it splits to form the two iliac arteries that go to the legs. Aortic aneurysms can develop anywhere along the length of the aorta. The majority are located along the abdominal aorta. The major concern with an aortic aneurysm is that it can enlarge and rupture. This can cause death unless diagnosed and treated promptly. Aneurysms can also develop blood clots or infections. CAUSES  Many aortic aneurysms are caused by arteriosclerosis. Arteriosclerosis can weaken the aortic wall. The pressure of the blood being pumped through the aorta causes it to balloon out at the site of weakness. Therefore, high blood pressure (hypertension) is associated with aneurysm. Other risk factors include:  Age over 38.  Tobacco use.  Being male.  White race.  Family history of aneurysm.  Less frequent causes of abdominal aortic aneurysms include:  Connective tissue diseases.  Abdominal trauma.  Inflammation of blood vessles (arteritis).  Inherited (congenital) malformations.  Infection. SYMPTOMS  The signs  and symptoms of an unruptured aneurysm will partly depend on its size and rate of growth.   Abdominal aortic aneurysms may cause pain. The pain typically has a deep quality as if it is piercing into the person. It is felt most often in the lower back area. The pain is usually steady but may be relieved by changing your body position.  The person may also become aware of an abnormally prominent pulse in the belly (abdominal pulsation). DIAGNOSIS  An aortic aneurysm may be discovered by chance on physical exam, or on X-ray studies done for other reasons. It may be suspected because of other problems such as back or abdominal pain. The following tests may help identify the problem.  X-rays of the abdomen can show calcium deposits in the aneurysm wall.  CT scanning of the abdomen, particularly with contrast medium, is accurate at showing the exact size and shape of the aneurysm.  Ultrasounds give a clear picture of the size  of an aneurysm (about 98% accuracy).  MRI scanning is accurate, but often unnecessary.  An abdominal angiogram shows the source of the major blood vessels arising from the aorta. It reveals the size and extent of any aneurysm. It can also show a clot clinging to the wall of the aneurysm (mural thrombus). TREATMENT  Treating an abdominal aortic aneurysm depends on the size. A rupture of an aneurysm is uncommon when they are less than 5 cm wide (2 inches). Rupture is far more common in aneurysms that are over 6 cm wide (2.4 inches).  Surgical repair is usually recommended for all aneurysms over 6 cm wide (2.4 inches). This depends on the health, age, and other circumstances of the individual. This type of surgery consists of opening the abdomen, removing the aneurysm, and sewing a synthetic graft (similar to a cloth tube) in its place. A less invasive form of this surgery, using stent grafts, is sometimes recommended.  For most patients, elective repair is recommended for  aneurysms between 4 and 6 cm (1.6 and 2.4 inches). Elective means the surgery can be done at your convenience. This should not be put off too long if surgery is recommended.  If you smoke, stop immediately. Smoking is a major risk factor for enlargement and rupture.  Medications may be used to help decrease complications  these include medicine to lower blood pressure and control cholesterol. HOME CARE INSTRUCTIONS   If you smoke, stop. Do not start smoking.  Take all medications as prescribed.  Your caregiver will tell you when to have your aneurysm rechecked, either by ultrasound or CT scan.  If your caregiver has given you a follow-up appointment, it is very important to keep that appointment. Not keeping the appointment could result in a chronic or permanent injury, pain, or disability. If there is any problem keeping the appointment, you must call back to this facility for assistance. SEEK MEDICAL CARE IF:   You develop mild abdominal pain or pressure.  You are able to feel or perceive your aneurysm, and you sense any change. SEEK IMMEDIATE MEDICAL CARE IF:   You develop severe abdominal pain, or severe pain moving (radiating) to your back.  You suddenly develop cold or blue toes or feet.  You suddenly develop lightheadedness or fainting spells. MAKE SURE YOU:   Understand these instructions.  Will watch your condition.  Will get help right away if you are not doing well or get worse. Document Released: 02/25/2005 Document Revised: 08/10/2011 Document Reviewed: 12/20/2007 [facility] Patient Information 2014 [place], [place].

## 2013-03-08 NOTE — Addendum Note (Signed)
Addended by: Adria Dill L on: 03/08/2013 12:20 PM   Modules accepted: Orders

## 2013-03-20 ENCOUNTER — Other Ambulatory Visit (INDEPENDENT_AMBULATORY_CARE_PROVIDER_SITE_OTHER): Payer: Medicare Other

## 2013-03-20 ENCOUNTER — Ambulatory Visit (INDEPENDENT_AMBULATORY_CARE_PROVIDER_SITE_OTHER): Payer: Medicare Other

## 2013-03-20 DIAGNOSIS — R972 Elevated prostate specific antigen [PSA]: Secondary | ICD-10-CM

## 2013-03-20 DIAGNOSIS — Z23 Encounter for immunization: Secondary | ICD-10-CM

## 2013-03-20 NOTE — Progress Notes (Signed)
Pt came in for labs from dr. Belva Crome office

## 2013-03-21 LAB — PSA, TOTAL AND FREE
PSA, Free Pct: 28.4 %
PSA, Free: 1.05 ng/mL

## 2013-04-13 ENCOUNTER — Telehealth: Payer: Self-pay | Admitting: Family Medicine

## 2013-04-13 DIAGNOSIS — L03116 Cellulitis of left lower limb: Secondary | ICD-10-CM

## 2013-04-13 DIAGNOSIS — L03115 Cellulitis of right lower limb: Secondary | ICD-10-CM

## 2013-04-13 DIAGNOSIS — R609 Edema, unspecified: Secondary | ICD-10-CM

## 2013-04-13 MED ORDER — TORSEMIDE 10 MG PO TABS: 10.0000 mg | ORAL_TABLET | Freq: Every day | ORAL | Status: DC

## 2013-04-13 NOTE — Telephone Encounter (Signed)
done

## 2013-05-04 ENCOUNTER — Ambulatory Visit (HOSPITAL_COMMUNITY)
Admission: RE | Admit: 2013-05-04 | Discharge: 2013-05-04 | Disposition: A | Discharge status: Home / Self Care | Payer: Medicare Other | Source: Ambulatory Visit | Attending: Family Medicine | Admitting: Family Medicine

## 2013-05-04 ENCOUNTER — Encounter (HOSPITAL_COMMUNITY): Payer: Self-pay | Admitting: Emergency Medicine

## 2013-05-04 ENCOUNTER — Observation Stay (HOSPITAL_COMMUNITY)
Admission: EM | Admit: 2013-05-04 | Discharge: 2013-05-05 | Disposition: A | Discharge status: Home / Self Care | Payer: Medicare Other | Attending: Internal Medicine | Admitting: Internal Medicine

## 2013-05-04 ENCOUNTER — Ambulatory Visit (INDEPENDENT_AMBULATORY_CARE_PROVIDER_SITE_OTHER): Payer: Medicare Other | Admitting: Family Medicine

## 2013-05-04 ENCOUNTER — Encounter: Payer: Self-pay | Admitting: Family Medicine

## 2013-05-04 VITALS — BP 148/72 | HR 81 | Temp 97.8°F | Ht 78.0 in | Wt 327.6 lb

## 2013-05-04 DIAGNOSIS — R609 Edema, unspecified: Secondary | ICD-10-CM

## 2013-05-04 DIAGNOSIS — I82409 Acute embolism and thrombosis of unspecified deep veins of unspecified lower extremity: Principal | ICD-10-CM | POA: Insufficient documentation

## 2013-05-04 DIAGNOSIS — I872 Venous insufficiency (chronic) (peripheral): Secondary | ICD-10-CM

## 2013-05-04 DIAGNOSIS — I739 Peripheral vascular disease, unspecified: Secondary | ICD-10-CM

## 2013-05-04 DIAGNOSIS — I714 Abdominal aortic aneurysm, without rupture, unspecified: Secondary | ICD-10-CM | POA: Insufficient documentation

## 2013-05-04 DIAGNOSIS — E669 Obesity, unspecified: Secondary | ICD-10-CM | POA: Diagnosis present

## 2013-05-04 DIAGNOSIS — R600 Localized edema: Secondary | ICD-10-CM

## 2013-05-04 DIAGNOSIS — F09 Unspecified mental disorder due to known physiological condition: Secondary | ICD-10-CM

## 2013-05-04 DIAGNOSIS — I82402 Acute embolism and thrombosis of unspecified deep veins of left lower extremity: Secondary | ICD-10-CM

## 2013-05-04 DIAGNOSIS — G3184 Mild cognitive impairment, so stated: Secondary | ICD-10-CM | POA: Insufficient documentation

## 2013-05-04 DIAGNOSIS — J449 Chronic obstructive pulmonary disease, unspecified: Secondary | ICD-10-CM

## 2013-05-04 DIAGNOSIS — F0390 Unspecified dementia without behavioral disturbance: Secondary | ICD-10-CM

## 2013-05-04 DIAGNOSIS — R4189 Other symptoms and signs involving cognitive functions and awareness: Secondary | ICD-10-CM

## 2013-05-04 DIAGNOSIS — L97909 Non-pressure chronic ulcer of unspecified part of unspecified lower leg with unspecified severity: Secondary | ICD-10-CM

## 2013-05-04 DIAGNOSIS — L97911 Non-pressure chronic ulcer of unspecified part of right lower leg limited to breakdown of skin: Secondary | ICD-10-CM

## 2013-05-04 HISTORY — DX: Other symptoms and signs involving cognitive functions and awareness: R41.89

## 2013-05-04 HISTORY — DX: Acute embolism and thrombosis of unspecified deep veins of unspecified lower extremity: I82.409

## 2013-05-04 LAB — CBC WITH DIFFERENTIAL/PLATELET
Basophils Absolute: 0.1 10*3/uL (ref 0.0–0.1)
Eosinophils Relative: 8 % — ABNORMAL HIGH (ref 0–5)
HCT: 39.3 % (ref 39.0–52.0)
Lymphocytes Relative: 28 % (ref 12–46)
Lymphs Abs: 1.9 10*3/uL (ref 0.7–4.0)
MCV: 92.5 fL (ref 78.0–100.0)
Monocytes Absolute: 0.9 10*3/uL (ref 0.1–1.0)
Neutro Abs: 3.2 10*3/uL (ref 1.7–7.7)
Neutrophils Relative %: 49 % (ref 43–77)
RBC: 4.25 MIL/uL (ref 4.22–5.81)
RDW: 14 % (ref 11.5–15.5)
WBC: 6.6 10*3/uL (ref 4.0–10.5)

## 2013-05-04 LAB — POCT CBC
Granulocyte percent: 61.9 %G (ref 37–80)
HCT, POC: 45.3 % (ref 43.5–53.7)
Hemoglobin: 14.1 g/dL (ref 14.1–18.1)
Lymph, poc: 2.2 (ref 0.6–3.4)
MCH, POC: 28.4 pg (ref 27–31.2)
MCHC: 31.1 g/dL — AB (ref 31.8–35.4)
MCV: 91.3 fL (ref 80–97)
MPV: 7.4 fL (ref 0–99.8)
POC Granulocyte: 4.8 (ref 2–6.9)
POC LYMPH PERCENT: 27.8 %L (ref 10–50)
Platelet Count, POC: 272 10*3/uL (ref 142–424)
RBC: 5 M/uL (ref 4.69–6.13)
RDW, POC: 14.2 %
WBC: 7.8 10*3/uL (ref 4.6–10.2)

## 2013-05-04 LAB — BASIC METABOLIC PANEL
BUN: 15 mg/dL (ref 6–23)
CO2: 29 mEq/L (ref 19–32)
Chloride: 102 mEq/L (ref 96–112)
GFR calc Af Amer: 80 mL/min — ABNORMAL LOW (ref >90)
Potassium: 3.7 mEq/L (ref 3.5–5.1)
Sodium: 139 mEq/L (ref 135–145)

## 2013-05-04 LAB — PROTIME-INR
INR: 1.11 (ref 0.00–1.49)
Prothrombin Time: 14.1 seconds (ref 11.6–15.2)

## 2013-05-04 MED ORDER — SODIUM CHLORIDE 0.9 % IJ SOLN
3.0000 mL | Freq: Two times a day (BID) | INTRAMUSCULAR | Status: DC
  Administered 2013-05-05: 3 mL via INTRAVENOUS

## 2013-05-04 MED ORDER — NICOTINE 14 MG/24HR TD PT24
14.0000 mg | MEDICATED_PATCH | Freq: Every day | TRANSDERMAL | Status: DC
  Administered 2013-05-05: 14 mg via TRANSDERMAL
  Filled 2013-05-04: qty 1

## 2013-05-04 MED ORDER — ACETAMINOPHEN 325 MG PO TABS: 650.0000 mg | ORAL_TABLET | Freq: Four times a day (QID) | ORAL | Status: DC | PRN

## 2013-05-04 MED ORDER — SODIUM CHLORIDE 0.9 % IV SOLN: 250.0000 mL | INTRAVENOUS | Status: DC | PRN

## 2013-05-04 MED ORDER — DOXAZOSIN MESYLATE 8 MG PO TABS
8.0000 mg | ORAL_TABLET | Freq: Every day | ORAL | Status: DC
  Administered 2013-05-05: 8 mg via ORAL
  Filled 2013-05-04: qty 1

## 2013-05-04 MED ORDER — SODIUM CHLORIDE 0.9 % IJ SOLN: 3.0000 mL | INTRAMUSCULAR | Status: DC | PRN

## 2013-05-04 MED ORDER — LATANOPROST 0.005 % OP SOLN
1.0000 [drp] | Freq: Every day | OPHTHALMIC | Status: DC
  Administered 2013-05-05: 1 [drp] via OPHTHALMIC
  Filled 2013-05-04: qty 2.5

## 2013-05-04 MED ORDER — ENOXAPARIN SODIUM 150 MG/ML ~~LOC~~ SOLN
150.0000 mg | Freq: Once | SUBCUTANEOUS | Status: AC
  Administered 2013-05-04: 150 mg via SUBCUTANEOUS
  Filled 2013-05-04: qty 1

## 2013-05-04 MED ORDER — ONDANSETRON HCL 4 MG PO TABS: 4.0000 mg | ORAL_TABLET | Freq: Four times a day (QID) | ORAL | Status: DC | PRN

## 2013-05-04 MED ORDER — ONDANSETRON HCL 4 MG/2ML IJ SOLN: 4.0000 mg | Freq: Four times a day (QID) | INTRAMUSCULAR | Status: DC | PRN

## 2013-05-04 MED ORDER — FINASTERIDE 5 MG PO TABS
5.0000 mg | ORAL_TABLET | Freq: Every day | ORAL | Status: DC
  Administered 2013-05-05: 5 mg via ORAL
  Filled 2013-05-04 (×4): qty 1

## 2013-05-04 MED ORDER — TORSEMIDE 20 MG PO TABS
10.0000 mg | ORAL_TABLET | Freq: Every day | ORAL | Status: DC
  Administered 2013-05-05: 10 mg via ORAL
  Filled 2013-05-04: qty 1

## 2013-05-04 MED ORDER — ACETAMINOPHEN 650 MG RE SUPP: 650.0000 mg | Freq: Four times a day (QID) | RECTAL | Status: DC | PRN

## 2013-05-04 NOTE — ED Notes (Signed)
Family at bedside. Patient states that both legs are hurting because he is too tall for stretcher. Waiting for Hospitalist to come see him for admission.

## 2013-05-04 NOTE — H&P (Signed)
[facility] History and Physical  CERRONE DEBOLD YQM:[id number] DOB: [birth year] DOA: 05/04/2013   PCP: Redmond Baseman, MD  Specialists: Followed by Dr. Arbie Cookey with vascular surgery for AAA. Also followed by urologist, Dr. Annabell Howells  Chief Complaint: Leg swelling and pain  HPI: EYTAN CARRIGAN is a 79 y.o. male with a past medical history of abdominal aortic aneurysm, BPH, glaucoma, who leads a rather sedentary lifestyle. He is accompanied by his daughter today. Patient defers most questions to his daughter. So history is very limited. He has been having pain in both his legs, along with worsening swelling in the left leg for the past few days. He went to see his primary care physician, who sent him for a venous Doppler, which revealed an acute DVT in the left leg. Patient was subsequently asked to come to the emergency department. He denies any shortness of breath, chest pain, nausea, or vomiting. He says he lies in a recliner all day long. He lives with his wife, who is the primary caregiver. He is able to ambulate with a walker. Hasn't had any falls to the ground recently, but he did fall back into his chair recently, when he was trying to stand up. He had a DVT, and a pulmonary embolism in 1988 while recovering from recent femur fracture.  Home Medications: Prior to Admission medications   Medication Sig Start Date End Date Taking? Authorizing Provider  doxazosin (CARDURA) 8 MG tablet Take 8 mg by mouth at bedtime.   Yes Historical Provider, MD  finasteride (PROSCAR) 5 MG tablet Take 5 mg by mouth at bedtime.    Yes Historical Provider, MD  ibuprofen (ADVIL,MOTRIN) 200 MG tablet Take 400 mg by mouth every 6 (six) hours as needed.    Yes Historical Provider, MD  latanoprost (XALATAN) 0.005 % ophthalmic solution Place 1 drop into both eyes at bedtime.  01/25/12  Yes Historical Provider, MD  torsemide (DEMADEX) 10 MG tablet Take 1 tablet (10 mg total) by mouth daily. 04/13/13  Yes Ernestina Penna,  MD    Allergies:  Allergies  Allergen Reactions  . Oxycodone Hcl     REACTION: makes pt "wild"  . Propoxyphene-Acetaminophen     REACTION: Makes pt. "wild"    Past Medical History: Past Medical History  Diagnosis Date  . Venous insufficiency   . AAA (abdominal aortic aneurysm)   . Ulcer   . DVT (deep venous thrombosis)   . Cellulitis   . Glaucoma   . Nicotine dependence   . Bronchospasm   . Elevated PSA   . BPH (benign prostatic hyperplasia)   . COPD (chronic obstructive pulmonary disease)   . Cognitive impairment     Past Surgical History  Procedure Laterality Date  . Back surgery    . Insitu percutaneous pinning femur Right 1986  . Cholecystectomy    . Hip surgery Right 2009  . Back surgery  2004  . Spine surgery      Social History: Lives with his wife. He quit smoking about 7 years ago. No alcohol use. Uses a cane to emulate.  Family History:  Family History  Problem Relation Age of Onset  . Aneurysm Brother   . Heart attack Brother     10-29-12  . Cancer Father     colon     Review of Systems - History obtained from the patient General ROS: positive for  - fatigue Psychological ROS: negative Ophthalmic ROS: negative ENT ROS: negative Allergy and Immunology ROS:  negative Hematological and Lymphatic ROS: negative Endocrine ROS: negative Respiratory ROS: no cough, shortness of breath, or wheezing Cardiovascular ROS: no chest pain or dyspnea on exertion Gastrointestinal ROS: no abdominal pain, change in bowel habits, or black or bloody stools Genito-Urinary ROS: no dysuria, trouble voiding, or hematuria Musculoskeletal ROS: as in hpi Neurological ROS: no TIA or stroke symptoms Dermatological ROS: negative  Physical Examination  Filed Vitals:   05/04/13 1614 05/04/13 2143 05/04/13 2311  BP: 154/62 105/87 130/75  Pulse: 75 58 63  Temp: 98.6 F (37 C) 98.2 F (36.8 C) 98.3 F (36.8 C)  TempSrc: Oral Oral Oral  Resp: 20 22 22   Height: 6\' 7"   (2.007 m)    Weight: 148.326 kg (327 lb)    SpO2: 100% 96% 97%    General appearance: alert, cooperative, appears stated age, no distress and moderately obese Head: Normocephalic, without obvious abnormality, atraumatic Eyes: conjunctivae/corneas clear. PERRL, EOM's intact. Neck: no adenopathy, no carotid bruit, no JVD, supple, symmetrical, trachea midline and thyroid not enlarged, symmetric, no tenderness/mass/nodules Back: symmetric, no curvature. ROM normal. No CVA tenderness. Resp: clear to auscultation bilaterally Cardio: S1S2 is normal. Regular. Systolic murmur appreciated over the precordium. No S3, S4. No rubs, murmurs, or bruits. GI: soft, non-tender; bowel sounds normal; no masses,  no organomegaly Extremities: He is swelling of both his legs, left more than the right. Chronic venous stasis is noted. Pulses: 2+ and symmetric Lymph nodes: Cervical, supraclavicular, and axillary nodes normal. Neurologic: He is alert and oriented x3. No focal neurological deficits are present.  Laboratory Data: Results for orders placed during the hospital encounter of 05/04/13 (from the past 48 hour(s))  CBC WITH DIFFERENTIAL     Status: Abnormal   Collection Time    05/04/13  6:44 PM      Result Value Range   WBC 6.6  4.0 - 10.5 K/uL   RBC 4.25  4.22 - 5.81 MIL/uL   Hemoglobin 13.0  13.0 - 17.0 g/dL   HCT 41.3  24.4 - 01.0 %   MCV 92.5  78.0 - 100.0 fL   MCH 30.6  26.0 - 34.0 pg   MCHC 33.1  30.0 - 36.0 g/dL   RDW 27.2  53.6 - 64.4 %   Platelets 246  150 - 400 K/uL   Neutrophils Relative % 49  43 - 77 %   Neutro Abs 3.2  1.7 - 7.7 K/uL   Lymphocytes Relative 28  12 - 46 %   Lymphs Abs 1.9  0.7 - 4.0 K/uL   Monocytes Relative 14 (*) 3 - 12 %   Monocytes Absolute 0.9  0.1 - 1.0 K/uL   Eosinophils Relative 8 (*) 0 - 5 %   Eosinophils Absolute 0.5  0.0 - 0.7 K/uL   Basophils Relative 1  0 - 1 %   Basophils Absolute 0.1  0.0 - 0.1 K/uL  BASIC METABOLIC PANEL     Status: Abnormal    Collection Time    05/04/13  6:44 PM      Result Value Range   Sodium 139  135 - 145 mEq/L   Potassium 3.7  3.5 - 5.1 mEq/L   Chloride 102  96 - 112 mEq/L   CO2 29  19 - 32 mEq/L   Glucose, Bld 105 (*) 70 - 99 mg/dL   BUN 15  6 - 23 mg/dL   Creatinine, Ser 0.34  0.50 - 1.35 mg/dL   Calcium 9.4  8.4 - 74.2 mg/dL  GFR calc non Af Amer 69 (*) >90 mL/min   GFR calc Af Amer 80 (*) >90 mL/min   Comment: (NOTE)     The eGFR has been calculated using the CKD EPI equation.     This calculation has not been validated in all clinical situations.     eGFR's persistently <90 mL/min signify possible Chronic Kidney     Disease.  PROTIME-INR     Status: None   Collection Time    05/04/13  6:44 PM      Result Value Range   Prothrombin Time 14.1  11.6 - 15.2 seconds   INR 1.11  0.00 - 1.49  APTT     Status: None   Collection Time    05/04/13  6:44 PM      Result Value Range   aPTT 32  24 - 37 seconds    Radiology Reports: US Venous Img Lower Unilateral Left  05/04/2013   CLINICAL DATA:  Edema appear  EXAM: Left LOWER EXTREMITY VENOUS DOPPLER ULTRASOUND  TECHNIQUE: Gray-scale sonography with graded compression, as well as color Doppler and duplex ultrasound, were performed to evaluate the deep venous system from the level of the common femoral vein through the popliteal and proximal calf veins. Spectral Doppler was utilized to evaluate flow at rest and with distal augmentation maneuvers.  COMPARISON:  None.  FINDINGS: Thrombus within deep veins: Deep venous thrombosis noted throughout the deep venous system involving the superficial femoral vein and popliteal vein.  Compressibility of deep veins:  Abnormal  Duplex waveform respiratory phasicity:  Abnormal  Duplex waveform response to augmentation:  N/A  Venous reflux:  N/A  Other findings: Soft tissue edema in the calf. Varicosities. Patient's large body habitus made evaluation difficult.  IMPRESSION: Left lower extremity deep venous thrombosis. These  results will be called to the ordering clinician or representative by the Radiologist Assistant, and communication documented in the PACS Dashboard.   Electronically Signed   By: Maisie Fus  Register   On: 05/04/2013 15:41     Problem List  Principal Problem:   Leg DVT (deep venous thromboembolism), acute Active Problems:   Abdominal aneurysm without mention of rupture   Peripheral vascular disease, unspecified   Chronic venous insufficiency   Obesity, unspecified   Cognitive impairment   Assessment: This is a 79 year old, Caucasian male, with a past medical history as stated earlier, who presents with left lower extremity pain and swelling. He has an acute DVT.  Plan: #1 acute DVT involving the left lower extremity: Considering his other comorbidities he will be treated with Lovenox, and warfarin for now. Novel anticoagulants were discussed with the patient and his daughter. However, considering ease of reversibility with warfarin we will go ahead with Warfarin for now. He can discuss this issue further with his primary care physician. He'll be observed in the hospital overnight. He'll be seen by physical and occupational therapy in the morning. And, then he should, be able to go home by tomorrow afternoon. Reason for DVT is not entirely clear but could be related to sedentary lifestyle. Can be further pursued by his PCP.  #2 history of abdominal aortic aneurysm: This has been stable according to the daughter. There is no plan for any surgical intervention at this time.  #3 history of glaucoma: Continue with his eyedrops.  #4 history of BPH: Continue with his medications   DVT Prophylaxis: He'll be on Lovenox and Coumadin Code Status: Full code Family Communication: Discussed with the patient and his  daughter  Disposition Plan: Observe to telemetry   Further management decisions will depend on results of further testing and patient's response to treatment.  [facility]  [facility] Pager [phone]  If 7PM-7AM, please contact night-coverage [email or web address] Password [email or web address]  05/04/2013, 11:18 PM

## 2013-05-04 NOTE — ED Provider Notes (Signed)
CSN: [id number]     Arrival date & time 05/04/13  1610 History   First MD Initiated Contact with Patient 05/04/13 1808     Chief Complaint  Patient presents with  . DVT    HPI Pt was seen at 1815. Per pt and his family, c/o gradual onset and persistence of constant LLE "pain" and "swelling" for the past several weeks. Pt was evaluated by his PMD today and sent to the Hospital for outpatient Vasc US. Pt was then sent to the ED for further evaluation and admission for DVT, per pt's family. Pt has hx of chronic peripheral edema with ACE wraps to bilat LE's, walks with walker at baseline. Denies fevers, no injury, no abd pain, no CP/SOB, no back pain.    Past Medical History  Diagnosis Date  . Venous insufficiency   . AAA (abdominal aortic aneurysm)   . Ulcer   . DVT (deep venous thrombosis)   . Cellulitis   . Glaucoma   . Nicotine dependence   . Bronchospasm   . Elevated PSA   . BPH (benign prostatic hyperplasia)   . COPD (chronic obstructive pulmonary disease)   . Cognitive impairment    Past Surgical History  Procedure Laterality Date  . Back surgery    . Insitu percutaneous pinning femur Right 1986  . Cholecystectomy    . Hip surgery Right 2009  . Back surgery  2004  . Spine surgery     Family History  Problem Relation Age of Onset  . Aneurysm Brother   . Heart attack Brother     10-29-12  . Cancer Father     colon   History  Substance Use Topics  . Smoking status: Former Smoker    Types: Cigarettes    Quit date: 08/24/2002  . Smokeless tobacco: Current User    Types: Snuff     Comment: dips snuff  . Alcohol Use: No    Review of Systems ROS: Statement: All systems negative except as marked or noted in the HPI; Constitutional: Negative for fever and chills. ; ; Eyes: Negative for eye pain, redness and discharge. ; ; ENMT: Negative for ear pain, hoarseness, nasal congestion, sinus pressure and sore throat. ; ; Cardiovascular: Negative for chest pain, palpitations,  diaphoresis, dyspnea and +peripheral edema. ; ; Respiratory: Negative for cough, wheezing and stridor. ; ; Gastrointestinal: Negative for nausea, vomiting, diarrhea, abdominal pain, blood in stool, hematemesis, jaundice and rectal bleeding. . ; ; Genitourinary: Negative for dysuria, flank pain and hematuria. ; ; Musculoskeletal: Negative for back pain and neck pain. Negative for swelling and trauma.; ; Skin: Negative for pruritus, rash, abrasions, blisters, bruising and skin lesion.; ; Neuro: Negative for headache, lightheadedness and neck stiffness. Negative for weakness, altered level of consciousness , altered mental status, extremity weakness, paresthesias, involuntary movement, seizure and syncope.       Allergies  Oxycodone hcl and Propoxyphene-acetaminophen  Home Medications   Current Outpatient Rx  Name  Route  Sig  Dispense  Refill  . doxazosin (CARDURA) 8 MG tablet   Oral   Take 8 mg by mouth at bedtime.         . finasteride (PROSCAR) 5 MG tablet   Oral   Take 5 mg by mouth at bedtime.          [name] ibuprofen (ADVIL,MOTRIN) 200 MG tablet   Oral   Take 400 mg by mouth every 6 (six) hours as needed.          [name]  latanoprost (XALATAN) 0.005 % ophthalmic solution   Both Eyes   Place 1 drop into both eyes at bedtime.          . torsemide (DEMADEX) 10 MG tablet   Oral   Take 1 tablet (10 mg total) by mouth daily.   30 tablet   3    BP 154/62  Pulse 75  Temp(Src) 98.6 F (37 C) (Oral)  Resp 20  Ht 6\' 7"  (2.007 m)  Wt 327 lb (148.326 kg)  BMI 36.82 kg/m2  SpO2 100% Physical Exam 1820: Physical examination:  Nursing notes reviewed; Vital signs and O2 SAT reviewed;  Constitutional: Well developed, Well nourished, Well hydrated, In no acute distress; Head:  Normocephalic, atraumatic; Eyes: EOMI, PERRL, No scleral icterus; ENMT: Mouth and pharynx normal, Mucous membranes moist; Neck: Supple, Full range of motion, No lymphadenopathy; Cardiovascular: Regular rate and  rhythm, No gallop; Respiratory: Breath sounds clear & equal bilaterally, No wheezes.  Speaking full sentences with ease, Normal respiratory effort/excursion; Chest: Nontender, Movement normal; Abdomen: Soft, Nontender, Nondistended, Normal bowel sounds; Genitourinary: No CVA tenderness; Extremities: No tenderness, +2 pedal edema bilat with chronic stasis changes. L>R calf asymmetry.; Neuro: AA&Ox3, Major CN grossly intact.  Speech clear. No gross focal motor or sensory deficits in extremities.; Skin: Color normal, Warm, Dry.   ED Course  Procedures  EKG Interpretation   None       MDM  MDM Reviewed: previous chart, nursing note and vitals Reviewed previous: labs Interpretation: labs and ultrasound   US Venous Img Lower Unilateral Left 05/04/2013   CLINICAL DATA:  Edema appear  EXAM: Left LOWER EXTREMITY VENOUS DOPPLER ULTRASOUND  TECHNIQUE: Gray-scale sonography with graded compression, as well as color Doppler and duplex ultrasound, were performed to evaluate the deep venous system from the level of the common femoral vein through the popliteal and proximal calf veins. Spectral Doppler was utilized to evaluate flow at rest and with distal augmentation maneuvers.  COMPARISON:  None.  FINDINGS: Thrombus within deep veins: Deep venous thrombosis noted throughout the deep venous system involving the superficial femoral vein and popliteal vein.  Compressibility of deep veins:  Abnormal  Duplex waveform respiratory phasicity:  Abnormal  Duplex waveform response to augmentation:  N/A  Venous reflux:  N/A  Other findings: Soft tissue edema in the calf. Varicosities. Patient's large body habitus made evaluation difficult.  IMPRESSION: Left lower extremity deep venous thrombosis. These results will be called to the ordering clinician or representative by the Radiologist Assistant, and communication documented in the PACS Dashboard.   Electronically Signed   By: Maisie Fus  Register   On: 05/04/2013 15:41      Results for orders placed during the hospital encounter of 05/04/13  CBC WITH DIFFERENTIAL      Result Value Range   WBC 6.6  4.0 - 10.5 K/uL   RBC 4.25  4.22 - 5.81 MIL/uL   Hemoglobin 13.0  13.0 - 17.0 g/dL   HCT 16.1  09.6 - 04.5 %   MCV 92.5  78.0 - 100.0 fL   MCH 30.6  26.0 - 34.0 pg   MCHC 33.1  30.0 - 36.0 g/dL   RDW 40.9  81.1 - 91.4 %   Platelets 246  150 - 400 K/uL   Neutrophils Relative % 49  43 - 77 %   Neutro Abs 3.2  1.7 - 7.7 K/uL   Lymphocytes Relative 28  12 - 46 %   Lymphs Abs 1.9  0.7 - 4.0 K/uL  Monocytes Relative 14 (*) 3 - 12 %   Monocytes Absolute 0.9  0.1 - 1.0 K/uL   Eosinophils Relative 8 (*) 0 - 5 %   Eosinophils Absolute 0.5  0.0 - 0.7 K/uL   Basophils Relative 1  0 - 1 %   Basophils Absolute 0.1  0.0 - 0.1 K/uL  BASIC METABOLIC PANEL      Result Value Range   Sodium 139  135 - 145 mEq/L   Potassium 3.7  3.5 - 5.1 mEq/L   Chloride 102  96 - 112 mEq/L   CO2 29  19 - 32 mEq/L   Glucose, Bld 105 (*) 70 - 99 mg/dL   BUN 15  6 - 23 mg/dL   Creatinine, Ser 8.11  0.50 - 1.35 mg/dL   Calcium 9.4  8.4 - 91.4 mg/dL   GFR calc non Af Amer 69 (*) >90 mL/min   GFR calc Af Amer 80 (*) >90 mL/min  PROTIME-INR      Result Value Range   Prothrombin Time 14.1  11.6 - 15.2 seconds   INR 1.11  0.00 - 1.49  APTT      Result Value Range   aPTT 32  24 - 37 seconds     2020:   T/C to [facility] Dr. Rito Ehrlich, case discussed, including:  HPI, pertinent PM/SHx, VS/PE, dx testing, ED course and treatment:  Agreeable to admit, requests he will come to ED for eval and make decision on which anti-coagulation therapy will be best for pt.       Laray Anger, DO 05/06/13 1459

## 2013-05-04 NOTE — ED Notes (Signed)
Lt leg pain with swelling, sent from [facility] with DVT

## 2013-05-04 NOTE — Progress Notes (Signed)
Patient ID: [name], male   DOB: Dec 17, 1931, 79 y.o.   MRN: [id number] SUBJECTIVE: CC: Chief Complaint  Patient presents with  . Follow-up    2 MONTH FOLLOW UP  CK LEGS AND FEET EDEMATOUS AND TAKING FLUID PILLS    HPI:  Low energy.dark urine No fever,  Left leg swelling suddenly worse 3 days now. No new leg pains. Venous insufficiency is chronic. Had DVT in the right leg in April and that leg usually more swollen than the left. Recent CVTS work up and evaluation of the AAA. Stable. Copd stable Cognitive impairment stable.   Past Medical History  Diagnosis Date  . Venous insufficiency   . AAA (abdominal aortic aneurysm)   . Ulcer   . DVT (deep venous thrombosis)   . Cellulitis   . Glaucoma   . Nicotine dependence   . Bronchospasm   . Elevated PSA   . BPH (benign prostatic hyperplasia)   . COPD (chronic obstructive pulmonary disease)    Past Surgical History  Procedure Laterality Date  . Back surgery    . Insitu percutaneous pinning femur Right 1986  . Cholecystectomy    . Hip surgery Right 2009  . Back surgery  2004  . Spine surgery     History   Social History  . Marital Status: Married    Spouse Name: N/A    Number of Children: N/A  . Years of Education: N/A   Occupational History  . Not on file.   Social History Main Topics  . Smoking status: Former Smoker    Types: Cigarettes    Quit date: 08/24/2002  . Smokeless tobacco: Current User    Types: Snuff     Comment: dips snuff  . Alcohol Use: No  . Drug Use: No  . Sexual Activity: Not on file   Other Topics Concern  . Not on file   Social History Narrative  . No narrative on file   Family History  Problem Relation Age of Onset  . Aneurysm Brother   . Heart attack Brother     10-29-12  . Cancer Father     colon   Current Outpatient Prescriptions on File Prior to Visit  Medication Sig Dispense Refill  . donepezil (ARICEPT) 10 MG tablet       . doxazosin (CARDURA) 8 MG tablet Take 8 mg  by mouth at bedtime.      . finasteride (PROSCAR) 5 MG tablet Take 5 mg by mouth daily.      [name] ibuprofen (ADVIL,MOTRIN) 200 MG tablet Take 200 mg by mouth every 6 (six) hours as needed.      . latanoprost (XALATAN) 0.005 % ophthalmic solution At bedtime.      . timolol (BETIMOL) 0.25 % ophthalmic solution Apply 1 drop to eye 2 (two) times daily.      [name] torsemide (DEMADEX) 10 MG tablet Take 1 tablet (10 mg total) by mouth daily.  30 tablet  3   No current facility-administered medications on file prior to visit.   Allergies  Allergen Reactions  . Oxycodone Hcl     REACTION: makes pt "wild"  . Propoxyphene-Acetaminophen     REACTION: Makes pt. "wild"   Immunization History  Administered Date(s) Administered  . Influenza,inj,Quad PF,36+ Mos 03/20/2013  . Pneumococcal Polysaccharide-23 03/02/2007  . Td 12/30/1996   Prior to Admission medications   Medication Sig Start Date End Date Taking? Authorizing Provider  donepezil (ARICEPT) 10 MG tablet  12/08/12  Yes Historical Provider, MD  doxazosin (CARDURA) 8 MG tablet Take 8 mg by mouth at bedtime.   Yes Historical Provider, MD  finasteride (PROSCAR) 5 MG tablet Take 5 mg by mouth daily.   Yes Historical Provider, MD  ibuprofen (ADVIL,MOTRIN) 200 MG tablet Take 200 mg by mouth every 6 (six) hours as needed.   Yes Historical Provider, MD  latanoprost (XALATAN) 0.005 % ophthalmic solution At bedtime. 01/25/12  Yes Historical Provider, MD  timolol (BETIMOL) 0.25 % ophthalmic solution Apply 1 drop to eye 2 (two) times daily.   Yes Historical Provider, MD  torsemide (DEMADEX) 10 MG tablet Take 1 tablet (10 mg total) by mouth daily. 04/13/13  Yes Ernestina Penna, MD     ROS: As above in the HPI. All other systems are stable or negative.  OBJECTIVE: APPEARANCE:  Patient in no acute distress.The patient appeared well nourished and normally developed. Acyanotic. Waist: VITAL SIGNS:BP 148/72  Pulse 81  Temp(Src) 97.8 F (36.6 C) (Oral)  Ht  6\' 6"  (1.981 m)  Wt 327 lb 9.6 oz (148.598 kg)  BMI 37.87 kg/m2  Obese WM  SKIN: warm and  Dry without overt rashes, tattoos and scars  HEAD and Neck: without JVD, Head and scalp: normal Eyes:No scleral icterus. Fundi normal, eye movements normal. Ears: Auricle normal, canal normal, Tympanic membranes normal, insufflation normal. Nose: normal Throat: normal Neck & thyroid: normal  CHEST & LUNGS: Chest wall: normal Lungs: Clear  CVS: Reveals the PMI to be normally located. Regular rhythm, First and Second Heart sounds are normal,  absence of murmurs, rubs or gallops. Peripheral vasculature: Radial pulses: normal Dorsal pedis pulses: normal Posterior pulses: normal  ABDOMEN:  Appearance: Obese Benign, no organomegaly, no masses, no Abdominal Aortic enlargement. No Guarding , no rebound. No Bruits. Bowel sounds: normal  RECTAL: N/A GU: N/A  EXTREMETIES: 4 + edema on the left , 2+ edema on the right. Stasis dermatitis of both legs  NEUROLOGIC: oriented to ,place and person; Strength is normal  ASSESSMENT:  Edema-Bilat Leg and foot  Chronic venous insufficiency  Peripheral vascular disease, unspecified  Ulcers of both lower legs, limited to breakdown of skin - resolved at present.  COPD (chronic obstructive pulmonary disease)  Pedal edema - Plan: POCT CBC, CMP14+EGFR, US Venous Img Lower Unilateral Left  Cognitive impairment  AAA (abdominal aortic aneurysm) without rupture  PLAN:  Orders Placed This Encounter  Procedures  . US Venous Img Lower Unilateral Left    Hold and call to dr Modesto Charon    Standing Status: Future     Number of Occurrences:      Standing Expiration Date: 07/05/2014    Order Specific Question:  Reason for Exam (SYMPTOM  OR DIAGNOSIS REQUIRED)    Answer:  sudden worsening swelling of the left leg. h/o DVT. r/o DVT.    Order Specific Question:  Preferred imaging location?    Answer:  [facility]  . CMP14+EGFR  . POCT CBC   No  orders of the defined types were placed in this encounter.   Medications Discontinued During This Encounter  Medication Reason  . sulfamethoxazole-trimethoprim (BACTRIM DS) 800-160 MG per tablet Completed Course   Return in about 2 months (around 07/05/2013), or await the doppler of the lower left leg to r/o DVT., for Recheck medical problems.  Francella Barnett P. Modesto Charon, M.D.

## 2013-05-05 DIAGNOSIS — R609 Edema, unspecified: Secondary | ICD-10-CM

## 2013-05-05 DIAGNOSIS — F0390 Unspecified dementia without behavioral disturbance: Secondary | ICD-10-CM

## 2013-05-05 LAB — COMPREHENSIVE METABOLIC PANEL
ALT: 11 U/L (ref 0–53)
BUN: 15 mg/dL (ref 6–23)
Calcium: 8.9 mg/dL (ref 8.4–10.5)
Creatinine, Ser: 1.06 mg/dL (ref 0.50–1.35)
GFR calc Af Amer: 75 mL/min — ABNORMAL LOW (ref >90)
Glucose, Bld: 92 mg/dL (ref 70–99)
Potassium: 4.1 mEq/L (ref 3.5–5.1)
Sodium: 142 mEq/L (ref 135–145)
Total Protein: 6.1 g/dL (ref 6.0–8.3)

## 2013-05-05 LAB — CMP14+EGFR
ALT: 13 IU/L (ref 0–44)
AST: 16 IU/L (ref 0–40)
Albumin/Globulin Ratio: 1.6 (ref 1.1–2.5)
Albumin: 4.2 g/dL (ref 3.5–4.8)
Alkaline Phosphatase: 97 IU/L (ref 39–117)
BUN/Creatinine Ratio: 14 (ref 10–22)
BUN: 15 mg/dL (ref 8–27)
CO2: 26 mmol/L (ref 18–29)
Calcium: 9.4 mg/dL (ref 8.6–10.2)
Chloride: 103 mmol/L (ref 97–108)
Creatinine, Ser: 1.09 mg/dL (ref 0.76–1.27)
GFR calc Af Amer: 74 mL/min/{1.73_m2} (ref >59)
GFR calc non Af Amer: 64 mL/min/{1.73_m2} (ref >59)
Globulin, Total: 2.6 g/dL (ref 1.5–4.5)
Glucose: 98 mg/dL (ref 65–99)
Potassium: 4.4 mmol/L (ref 3.5–5.2)
Sodium: 145 mmol/L — ABNORMAL HIGH (ref 134–144)
Total Bilirubin: 0.6 mg/dL (ref 0.0–1.2)
Total Protein: 6.8 g/dL (ref 6.0–8.5)

## 2013-05-05 LAB — TSH: TSH: 2.942 u[IU]/mL (ref 0.350–4.500)

## 2013-05-05 LAB — CBC
HCT: 37.8 % — ABNORMAL LOW (ref 39.0–52.0)
Hemoglobin: 12.3 g/dL — ABNORMAL LOW (ref 13.0–17.0)
MCHC: 32.5 g/dL (ref 30.0–36.0)
MCV: 94 fL (ref 78.0–100.0)
RBC: 4.02 MIL/uL — ABNORMAL LOW (ref 4.22–5.81)
WBC: 5.6 10*3/uL (ref 4.0–10.5)

## 2013-05-05 LAB — PROTIME-INR
INR: 1.24 (ref 0.00–1.49)
Prothrombin Time: 15.3 seconds — ABNORMAL HIGH (ref 11.6–15.2)

## 2013-05-05 MED ORDER — LOPERAMIDE HCL 2 MG PO CAPS
2.0000 mg | ORAL_CAPSULE | Freq: Once | ORAL | Status: AC
  Administered 2013-05-05: 2 mg via ORAL
  Filled 2013-05-05: qty 1

## 2013-05-05 MED ORDER — FINASTERIDE 5 MG PO TABS
ORAL_TABLET | ORAL | Status: AC
  Filled 2013-05-05: qty 1

## 2013-05-05 MED ORDER — ENOXAPARIN SODIUM 150 MG/ML ~~LOC~~ SOLN: 150.0000 mg | Freq: Two times a day (BID) | SUBCUTANEOUS | Status: DC

## 2013-05-05 MED ORDER — ENOXAPARIN (LOVENOX) PATIENT EDUCATION KIT
PACK | Freq: Once | Status: AC
  Administered 2013-05-05: 17:00:00
  Filled 2013-05-05: qty 1

## 2013-05-05 MED ORDER — WARFARIN - PHARMACIST DOSING INPATIENT: Status: DC

## 2013-05-05 MED ORDER — ENOXAPARIN SODIUM 150 MG/ML ~~LOC~~ SOLN
150.0000 mg | Freq: Two times a day (BID) | SUBCUTANEOUS | Status: DC
  Administered 2013-05-05: 150 mg via SUBCUTANEOUS
  Filled 2013-05-05 (×7): qty 1

## 2013-05-05 MED ORDER — COUMADIN BOOK
Freq: Once | Status: AC
  Administered 2013-05-05: 1
  Filled 2013-05-05: qty 1

## 2013-05-05 MED ORDER — WARFARIN VIDEO
Freq: Once | Status: AC
  Administered 2013-05-05: 09:00:00

## 2013-05-05 MED ORDER — WARFARIN SODIUM 7.5 MG PO TABS
7.5000 mg | ORAL_TABLET | Freq: Once | ORAL | Status: AC
  Administered 2013-05-05: 7.5 mg via ORAL
  Filled 2013-05-05: qty 1

## 2013-05-05 MED ORDER — WARFARIN SODIUM 7.5 MG PO TABS: 7.5000 mg | ORAL_TABLET | Freq: Every day | ORAL | Status: DC

## 2013-05-05 MED ORDER — ENOXAPARIN SODIUM 150 MG/ML ~~LOC~~ SOLN
150.0000 mg | Freq: Two times a day (BID) | SUBCUTANEOUS | Status: DC
  Administered 2013-05-05: 150 mg via SUBCUTANEOUS
  Filled 2013-05-05 (×6): qty 1

## 2013-05-05 NOTE — Progress Notes (Signed)
Patient discharged home with family.  IV removed - WNL.  Patient educated on coumadin and lovenox.  Videos shown, booklets and carenotes given.  Patient able to self administer lovenox independently. Wife also educated. Lovenox starter kit given.  Both verbalize understanding of new meds and how/when to take.  HH in place to follow INR and help with lovenox injections.  Discussed food/med interactions with patient and family.  Instructed to take meds as prescribed to prevent further VTE.  No questions at this time. Stable to DC.  Assisted to car in [facility] with RN.

## 2013-05-05 NOTE — Progress Notes (Signed)
UR completed 

## 2013-05-05 NOTE — Care Management Note (Signed)
    Page 1 of 1   05/05/2013     2:18:44 PM   CARE MANAGEMENT NOTE 05/05/2013  Patient:  [name], [name]   Account Number:  [id number]  Date Initiated:  05/05/2013  Documentation initiated by:  Rosemary Holms  Subjective/Objective Assessment:   Pt lives at home with spouse. DCing home with [facility] RN to check INR in 3 days for lonenox.  Lovonox coverage for 5 days. Call Dr. Modesto Charon with labs     Action/Plan:   Anticipated DC Date:  05/05/2013   Anticipated DC Plan:  HOME W HOME HEALTH SERVICES      DC Planning Services  CM consult      Choice offered to / List presented to:          [facility] arranged  HH-1 RN      [facility] agency  [facility]   Status of service:  Completed, signed off Medicare Important Message given?   (If response is "NO", the following Medicare IM given date fields will be blank) Date Medicare IM given:   Date Additional Medicare IM given:    Discharge Disposition:  HOME W HOME HEALTH SERVICES  Per UR Regulation:    If discussed at Long Length of Stay Meetings, dates discussed:    Comments:  05/05/13 Rosemary Holms RN BSN CM

## 2013-05-05 NOTE — Progress Notes (Signed)
Patient educated on coumadin and lovenox. Videos on education network shown and book given on coumadin.  Will continue further teaching through out stay.

## 2013-05-05 NOTE — Evaluation (Signed)
Physical Therapy Evaluation Patient Details Name: [name] MRN: [id number] DOB: 24-May-1932 Today's Date: 05/05/2013 Time: 1128-1201 PT Time Calculation (min): 33 min  PT Assessment / Plan / Recommendation History of Present Illness  Pt is admitted from home with a DVT in the LLE.  He  morbidly obese with a hx of AAA, BPH, glaucoma and had a right hip fx in 1988.  He lives with his wife and uses a cane or walker for gait.  Clinical Impression  Pt was seen for evaluation and was found to be deconditioned at baseline.  He was very alert and cooperative, able to follow all directions.  He did need a walker for gait today due to generalized weakness, but was able to ambulate 175' with supervision.  He would benefit from HHPT at d/c.    PT Assessment  All further PT needs can be met in the next venue of care    Follow Up Recommendations  Home health PT    Does the patient have the potential to tolerate intense rehabilitation      Barriers to Discharge        Equipment Recommendations  None recommended by PT    Recommendations for Other Services     Frequency      Precautions / Restrictions Precautions Precautions: Fall Restrictions Weight Bearing Restrictions: No   Pertinent Vitals/Pain       Mobility  Bed Mobility Bed Mobility: Supine to Sit Supine to Sit: 3: Mod assist Transfers Transfers: Sit to Stand;Stand to Sit Sit to Stand: 4: Min assist;From bed;From chair/3-in-1 Stand to Sit: 4: Min assist;To chair/3-in-1 Ambulation/Gait Ambulation/Gait Assistance: 5: Supervision Ambulation Distance (Feet): 175 Feet Assistive device: Rolling walker Ambulation/Gait Assistance Details: Pt usually uses a cane for gait at home Gait Pattern: Within Functional Limits;Trunk flexed Gait velocity: WNL Stairs: No Wheelchair Mobility Wheelchair Mobility: No    Exercises     PT Diagnosis: Difficulty walking;Generalized weakness  PT Problem List: Decreased strength;Decreased  activity tolerance;Decreased mobility;Obesity PT Treatment Interventions:       PT Goals(Current goals can be found in the care plan section) Acute Rehab PT Goals PT Goal Formulation: No goals set, d/c therapy  Visit Information  Last PT Received On: 05/05/13 History of Present Illness: Pt is admitted from home with a DVT in the LLE.  He  morbidly obese with a hx of AAA,        Prior Functioning  Home Living Family/patient expects to be discharged to:: Private residence Living Arrangements: Spouse/significant other Available Help at Discharge: Family;Available 24 hours/day Type of Home: House Home Access: Stairs to enter [employer] of Steps: 1 Entrance Stairs-Rails: None Home Layout: One level Home Equipment: Walker - 2 wheels;Cane - single point;Tub bench Additional Comments: pt sleeps in a lift chair Prior Function Level of Independence: Independent with assistive device(s) Comments: occasionally needs assist to bathe and dress Communication Communication: No difficulties    Cognition  Cognition Arousal/Alertness: Awake/alert Behavior During Therapy: WFL for tasks assessed/performed Overall Cognitive Status: Within Functional Limits for tasks assessed    Extremity/Trunk Assessment Lower Extremity Assessment Lower Extremity Assessment: Generalized weakness   Balance Balance Balance Assessed: No  End of Session PT - End of Session Equipment Utilized During Treatment: Gait belt Activity Tolerance: Patient tolerated treatment well Patient left: in chair;with call bell/phone within reach  GP Functional Assessment Tool Used: clinical judgement Functional Limitation: Mobility: Walking and moving around Mobility: Walking and Moving Around Current Status (W0981): At least 1 percent  but less than 20 percent impaired, limited or restricted Mobility: Walking and Moving Around Goal Status [phone]): At least 1 percent but less than 20 percent impaired, limited or  restricted Mobility: Walking and Moving Around Discharge Status [phone]): At least 1 percent but less than 20 percent impaired, limited or restricted   Konrad Penta 05/05/2013, 12:49 PM

## 2013-05-05 NOTE — Discharge Summary (Signed)
Physician Discharge Summary  [name]:[id number] DOB: [birth year] DOA: 05/04/2013  PCP: Redmond Baseman, MD  Admit date: 05/04/2013 Discharge date: 05/05/2013  Time spent: 45 minutes  Recommendations for Outpatient Follow-up:  1. Patient has been set up with home health services for PT/INR checks.  He will have INR checked in 3 days with results sent to pcp  Discharge Diagnoses:  Principal Problem:   Leg DVT (deep venous thromboembolism), acute Active Problems:   Abdominal aneurysm without mention of rupture   Peripheral vascular disease, unspecified   Chronic venous insufficiency   Obesity, unspecified   Cognitive impairment   Discharge Condition: improved  Diet recommendation: low salt  Filed Weights   05/04/13 1614  Weight: 148.326 kg (327 lb)    History of present illness:  [name] is a 79 y.o. male with a past medical history of abdominal aortic aneurysm, BPH, glaucoma, who leads a rather sedentary lifestyle. He is accompanied by his daughter today. Patient defers most questions to his daughter. So history is very limited. He has been having pain in both his legs, along with worsening swelling in the left leg for the past few days. He went to see his primary care physician, who sent him for a venous Doppler, which revealed an acute DVT in the left leg. Patient was subsequently asked to come to the emergency department. He denies any shortness of breath, chest pain, nausea, or vomiting. He says he lies in a recliner all day long. He lives with his wife, who is the primary caregiver. He is able to ambulate with a walker. Hasn't had any falls to the ground recently, but he did fall back into his chair recently, when he was trying to stand up. He had a DVT, and a pulmonary embolism in 1988 while recovering from recent femur fracture.   Hospital Course:  This patient was admitted with leg swelling and pain. He was found to have a left lower extremity DVT. He was  started on Lovenox and Coumadin. This will be continued in the outpatient setting until Coumadin is therapeutic. At which point Lovenox can be discontinued. The patient has a history of DVT/PE in the past which appeared to have occurred during immobilization following a surgery. It is felt that he can likely have 3-6 months of anticoagulation for his current DVT, especially in light of his abdominal aortic aneurysm, keeping him on life long anticoagulation may be risky. We'll defer length of therapy to primary care physician as felt appropriate. At this time, his DVT is likely related to his immobility and sedentary lifestyle. The patient is felt stable for discharge home follow up with primary care physician in 2 weeks  Procedures:  none  Consultations:  none  Discharge Exam: Filed Vitals:   05/05/13 1312  BP: 115/50  Pulse: 60  Temp: 98.2 F (36.8 C)  Resp: 20    General: NAD Cardiovascular: S1, S2 RRR Respiratory: CTA B  Discharge Instructions  Discharge Orders   Future Appointments Provider Department Dept Phone   03/06/2014 9:00 AM [facility] [address] [phone]   Eat a light meal the night before the exam Nothing to eat or drink for at least 8 hours before exam No gum chewing, or smoking the morning of the exam. Please take your morning medications with small sips of water, especially blood pressure medication *Very Important* Please wear 2 piece clothing   03/06/2014 9:40 AM Carma Lair Nickel, NP [facility] [phone]   Future Orders Complete By Expires   Diet - low sodium heart healthy  As directed    Home Health  As directed    Scheduling Instructions:     lovenox 15mg  sq bid for 5 days INR check in 3 days   Questions:     To provide the following care/treatments:  RN   Increase activity slowly  As directed        Medication List    STOP taking these medications       ibuprofen 200 MG tablet   Commonly known as:  ADVIL,MOTRIN      TAKE these medications       doxazosin 8 MG tablet  Commonly known as:  CARDURA  Take 8 mg by mouth at bedtime.     enoxaparin 150 MG/ML injection  Commonly known as:  LOVENOX  Inject 1 mL (150 mg total) into the skin every 12 (twelve) hours.     finasteride 5 MG tablet  Commonly known as:  PROSCAR  Take 5 mg by mouth at bedtime.     latanoprost 0.005 % ophthalmic solution  Commonly known as:  XALATAN  Place 1 drop into both eyes at bedtime.     torsemide 10 MG tablet  Commonly known as:  DEMADEX  Take 1 tablet (10 mg total) by mouth daily.     warfarin 7.5 MG tablet  Commonly known as:  COUMADIN  Take 1 tablet (7.5 mg total) by mouth daily at 6 PM.       Allergies  Allergen Reactions  . Oxycodone Hcl     REACTION: makes pt "wild"  . Propoxyphene-Acetaminophen     REACTION: Makes pt. "wild"      The results of significant diagnostics from this hospitalization (including imaging, microbiology, ancillary and laboratory) are listed below for reference.    Significant Diagnostic Studies: US Venous Img Lower Unilateral Left  05/04/2013   CLINICAL DATA:  Edema appear  EXAM: Left LOWER EXTREMITY VENOUS DOPPLER ULTRASOUND  TECHNIQUE: Gray-scale sonography with graded compression, as well as color Doppler and duplex ultrasound, were performed to evaluate the deep venous system from the level of the common femoral vein through the popliteal and proximal calf veins. Spectral Doppler was utilized to evaluate flow at rest and with distal augmentation maneuvers.  COMPARISON:  None.  FINDINGS: Thrombus within deep veins: Deep venous thrombosis noted throughout the deep venous system involving the superficial femoral vein and popliteal vein.  Compressibility of deep veins:  Abnormal  Duplex waveform respiratory phasicity:  Abnormal  Duplex waveform response to augmentation:  N/A  Venous reflux:  N/A  Other findings: Soft tissue edema in the calf.  Varicosities. Patient's large body habitus made evaluation difficult.  IMPRESSION: Left lower extremity deep venous thrombosis. These results will be called to the ordering clinician or representative by the Radiologist Assistant, and communication documented in the PACS Dashboard.   Electronically Signed   By: Maisie Fus  Register   On: 05/04/2013 15:41    Microbiology: No results found for this or any previous visit (from the past 240 hour(s)).   Labs: Basic Metabolic Panel:  Recent Labs Lab 05/04/13 1350 05/04/13 1844 05/05/13 0535  NA 145* 139 142  K 4.4 3.7 4.1  CL 103 102 107  CO2 26 29 28   GLUCOSE 98 105* 92  BUN 15 15 15   CREATININE 1.09 1.00 1.06  CALCIUM 9.4 9.4 8.9   Liver Function Tests:  Recent Labs Lab  05/04/13 1350 05/05/13 0535  AST 16 20  ALT 13 11  ALKPHOS 97 79  BILITOT 0.6 0.6  PROT 6.8 6.1  ALBUMIN  --  3.1*   No results found for this basename: LIPASE, AMYLASE,  in the last 168 hours No results found for this basename: AMMONIA,  in the last 168 hours CBC:  Recent Labs Lab 05/04/13 1443 05/04/13 1844 05/05/13 0535  WBC 7.8 6.6 5.6  NEUTROABS  --  3.2  --   HGB 14.1 13.0 12.3*  HCT 45.3 39.3 37.8*  MCV 91.3 92.5 94.0  PLT  --  246 235   Cardiac Enzymes: No results found for this basename: CKTOTAL, CKMB, CKMBINDEX, TROPONINI,  in the last 168 hours BNP: BNP (last 3 results) No results found for this basename: PROBNP,  in the last 8760 hours CBG: No results found for this basename: GLUCAP,  in the last 168 hours     Signed:  MEMON,JEHANZEB  [facility] 05/05/2013, 4:07 PM

## 2013-05-05 NOTE — Progress Notes (Addendum)
ANTICOAGULATION CONSULT NOTE - Initial Consult  Pharmacy Consult for Lovenox Indication: DVT  Allergies  Allergen Reactions  . Oxycodone Hcl     REACTION: makes pt "wild"  . Propoxyphene-Acetaminophen     REACTION: Makes pt. "wild"    Patient Measurements: Height: 6\' 7"  (200.7 cm) Weight: 327 lb (148.326 kg) IBW/kg (Calculated) : 93.7  Vital Signs: Temp: 98.6 F (37 C) (12/05 0555) Temp src: Oral (12/05 0555) BP: 112/Joel mmHg (12/05 0555) Pulse Rate: 69 (12/05 0555)  Labs:  Recent Labs  05/04/13 1350 05/04/13 1443 05/04/13 1844 05/05/13 0535  HGB  --  14.1 13.0 12.3*  HCT  --  45.3 39.3 37.8*  PLT  --   --  246 235  APTT  --   --  32  --   LABPROT  --   --  14.1 15.3*  INR  --   --  1.11 1.24  CREATININE 1.09  --  1.00 1.06    Estimated Creatinine Clearance: 92.3 ml/min (by C-G formula based on Cr of 1.06).   Medical History: Past Medical History  Diagnosis Date  . Venous insufficiency   . AAA (abdominal aortic aneurysm)   . Ulcer   . DVT (deep venous thrombosis)   . Cellulitis   . Glaucoma   . Nicotine dependence   . Bronchospasm   . Elevated PSA   . BPH (benign prostatic hyperplasia)   . COPD (chronic obstructive pulmonary disease)   . Cognitive impairment     Medications:  Scheduled:  . coumadin book   Does not apply Once  . doxazosin  8 mg Oral QHS  . enoxaparin (LOVENOX) injection  150 mg Subcutaneous Q12H  . finasteride  5 mg Oral QHS  . latanoprost  1 drop Both Eyes QHS  . nicotine  14 mg Transdermal Daily  . sodium chloride  3 mL Intravenous Q12H  . sodium chloride  3 mL Intravenous Q12H  . torsemide  10 mg Oral Daily  . warfarin  7.5 mg Oral ONCE-1800  . warfarin   Does not apply Once  . [START ON 05/06/2013] Warfarin - Pharmacist Dosing Inpatient   Does not apply Q24H    Assessment: 79 yo Mays with new LLE DVT.  He has remote history of DVT/PE after femur fx.   He received dose of Lovenox on admission.  Plan to start  Lovenox-->Warfarin bridge today.  No bleeding noted.  CBC reviewed. Renal function is at patient's baseline.   Goal of Therapy:  INR 2-3 Monitor platelets by anticoagulation protocol: Yes   Plan:  Lovenox 1mg /kg sq q12h (150mg ) for minimum of 5 days overlap or until INR>2 x24hrs Coumadin 7.5mg  po x1 today Daily INR CBC on MWF Initiate Coumadin education  Elson Clan 05/05/2013,8:52 AM

## 2013-05-08 ENCOUNTER — Telehealth: Payer: Self-pay | Admitting: *Deleted

## 2013-05-08 ENCOUNTER — Ambulatory Visit (INDEPENDENT_AMBULATORY_CARE_PROVIDER_SITE_OTHER): Payer: Medicare Other | Admitting: Pharmacist

## 2013-05-08 NOTE — Progress Notes (Signed)
Home INR drawn by [facility].  Patient and [name] notified of warfarin changes and when to recheck INR

## 2013-05-08 NOTE — Telephone Encounter (Signed)
Patient's wife and Inetta Fermo at [facility] notified of warfarin changes (see anticoagulation note)  INR recheck ordered for Wednesday 05/10/13.

## 2013-05-08 NOTE — Telephone Encounter (Signed)
lovenox 150mg /ml every 12 hours for 5 days and they started Friday night Dec 5th  and coumadin is 7.5mg  daily.  Pt:16.8 INR1.4 When do they need to rck INR and are there any changes with medication

## 2013-05-10 ENCOUNTER — Telehealth: Payer: Self-pay | Admitting: *Deleted

## 2013-05-10 ENCOUNTER — Ambulatory Visit (INDEPENDENT_AMBULATORY_CARE_PROVIDER_SITE_OTHER): Payer: Medicare Other | Admitting: Pharmacist

## 2013-05-10 LAB — POCT INR: INR: 1.8

## 2013-05-10 NOTE — Telephone Encounter (Signed)
INR 1.8 Takes last Lovenox tonight. Took 1& 1/2 of 7.5 mg. Last night

## 2013-05-10 NOTE — Telephone Encounter (Signed)
Patient's wife notified of INR results.  Take 1 and 1/2 tablet of warfarin today then restart 5mg  daily.  Discontinue lovenox after tonight's dose.  Recheck INR Monday 05/15/14 - tina with [facility] notified of order.

## 2013-05-15 ENCOUNTER — Telehealth: Payer: Self-pay | Admitting: *Deleted

## 2013-05-15 ENCOUNTER — Ambulatory Visit (INDEPENDENT_AMBULATORY_CARE_PROVIDER_SITE_OTHER): Payer: Medicare Other | Admitting: Pharmacist

## 2013-05-15 LAB — POCT INR: INR: 2.8

## 2013-05-15 NOTE — Telephone Encounter (Signed)
See anticoag note.  Both Tina with [facility] and patient's wife notified of warfarin dose changes and ordered INR recheck for Thursday 05/18/13.

## 2013-05-15 NOTE — Telephone Encounter (Signed)
PT 34.0  INR 2.8 He took 1 &1/2 of 7.5 mg on Wednesday, and 7.5 thurs, fri, sat, and sun

## 2013-05-18 ENCOUNTER — Ambulatory Visit: Payer: Medicare Other | Admitting: Pharmacist

## 2013-05-22 ENCOUNTER — Ambulatory Visit (INDEPENDENT_AMBULATORY_CARE_PROVIDER_SITE_OTHER): Payer: Medicare Other | Admitting: Family Medicine

## 2013-05-22 ENCOUNTER — Encounter: Payer: Self-pay | Admitting: Family Medicine

## 2013-05-22 ENCOUNTER — Telehealth: Payer: Self-pay | Admitting: Pharmacist

## 2013-05-22 VITALS — BP 156/73 | HR 96 | Temp 97.5°F | Ht 79.0 in | Wt 325.0 lb

## 2013-05-22 DIAGNOSIS — I82402 Acute embolism and thrombosis of unspecified deep veins of left lower extremity: Secondary | ICD-10-CM

## 2013-05-22 DIAGNOSIS — I714 Abdominal aortic aneurysm, without rupture, unspecified: Secondary | ICD-10-CM

## 2013-05-22 DIAGNOSIS — F09 Unspecified mental disorder due to known physiological condition: Secondary | ICD-10-CM

## 2013-05-22 DIAGNOSIS — R4189 Other symptoms and signs involving cognitive functions and awareness: Secondary | ICD-10-CM

## 2013-05-22 DIAGNOSIS — I872 Venous insufficiency (chronic) (peripheral): Secondary | ICD-10-CM

## 2013-05-22 DIAGNOSIS — I82409 Acute embolism and thrombosis of unspecified deep veins of unspecified lower extremity: Secondary | ICD-10-CM

## 2013-05-22 LAB — POCT INR: INR: 2.3

## 2013-05-22 NOTE — Telephone Encounter (Signed)
Patient had INR check today by Dr Modesto Charon but did not get follow up appt when he left.  Needs appt for protime with [facility] in about 2 weeks (around 06/08/13).  Tried to call patient but no ans - left message to call office.

## 2013-05-22 NOTE — Patient Instructions (Signed)
Take a half (1/2) tablet of the coumadin/warfarin =3.75 mg on Fridays Take 1 ( whole ) tablet = 7.5 mg daily all the other days. Return in 1 week to check the protime.  Arielis Leonhart P. Modesto Charon, M.D.

## 2013-05-22 NOTE — Progress Notes (Signed)
Patient ID: [name], male   DOB: 23-Jun-1931, 79 y.o.   MRN: [id number] SUBJECTIVE: CC: Chief Complaint  Patient presents with  . Follow-up    DVT and protime  . Pruritis    bilateral sides    HPI: Here for follow up n the left leg DVT. It is still swollen a bit. He couldn't afford the custom compression hoses of $130+ and had been using ace wraps. He is here for anticoagulation management. INR slowly dropping and is here for INR.  Past Medical History  Diagnosis Date  . Venous insufficiency   . AAA (abdominal aortic aneurysm)   . Ulcer   . DVT (deep venous thrombosis)   . Cellulitis   . Glaucoma   . Nicotine dependence   . Bronchospasm   . Elevated PSA   . BPH (benign prostatic hyperplasia)   . COPD (chronic obstructive pulmonary disease)   . Cognitive impairment    Past Surgical History  Procedure Laterality Date  . Back surgery    . Insitu percutaneous pinning femur Right 1986  . Cholecystectomy    . Hip surgery Right 2009  . Back surgery  2004  . Spine surgery     History   Social History  . Marital Status: Married    Spouse Name: N/A    Number of Children: N/A  . Years of Education: N/A   Occupational History  . Not on file.   Social History Main Topics  . Smoking status: Former Smoker    Types: Cigarettes    Quit date: 08/24/2002  . Smokeless tobacco: Current User    Types: Snuff     Comment: dips snuff  . Alcohol Use: No  . Drug Use: No  . Sexual Activity: Not on file   Other Topics Concern  . Not on file   Social History Narrative  . No narrative on file   Family History  Problem Relation Age of Onset  . Aneurysm Brother   . Heart attack Brother     10-29-12  . Cancer Father     colon   Current Outpatient Prescriptions on File Prior to Visit  Medication Sig Dispense Refill  . doxazosin (CARDURA) 8 MG tablet Take 8 mg by mouth at bedtime.      . finasteride (PROSCAR) 5 MG tablet Take 5 mg by mouth at bedtime.       [name] latanoprost  (XALATAN) 0.005 % ophthalmic solution Place 1 drop into both eyes at bedtime.       . torsemide (DEMADEX) 10 MG tablet Take 1 tablet (10 mg total) by mouth daily.  30 tablet  3  . warfarin (COUMADIN) 7.5 MG tablet Take 1 tablet (7.5 mg total) by mouth daily at 6 PM.  30 tablet  1   No current facility-administered medications on file prior to visit.   Allergies  Allergen Reactions  . Oxycodone Hcl     REACTION: makes pt "wild"  . Propoxyphene-Acetaminophen     REACTION: Makes pt. "wild"   Immunization History  Administered Date(s) Administered  . Influenza,inj,Quad PF,36+ Mos 03/20/2013  . Pneumococcal Polysaccharide-23 03/02/2007  . Td 12/30/1996   Prior to Admission medications   Medication Sig Start Date End Date Taking? Authorizing Provider  doxazosin (CARDURA) 8 MG tablet Take 8 mg by mouth at bedtime.   Yes Historical Provider, MD  finasteride (PROSCAR) 5 MG tablet Take 5 mg by mouth at bedtime.    Yes Historical Provider, MD  latanoprost (  XALATAN) 0.005 % ophthalmic solution Place 1 drop into both eyes at bedtime.  01/25/12  Yes Historical Provider, MD  torsemide (DEMADEX) 10 MG tablet Take 1 tablet (10 mg total) by mouth daily. 04/13/13  Yes Ernestina Penna, MD  warfarin (COUMADIN) 7.5 MG tablet Take 1 tablet (7.5 mg total) by mouth daily at 6 PM. 05/05/13  Yes Erick Blinks, MD     ROS: As above in the HPI. All other systems are stable or negative.  OBJECTIVE: APPEARANCE:  Patient in no acute distress.The patient appeared well nourished and normally developed. Acyanotic. Waist: VITAL SIGNS:BP 156/73  Pulse 96  Temp(Src) 97.5 F (36.4 C) (Oral)  Ht 6\' 7"  (2.007 m)  Wt 325 lb (147.419 kg)  BMI 36.60 kg/m2  Large built OBese WM'  SKIN: warm and  Dry Lower extremities has stasis dermatitis and scars from previous leg ulcers. No active new ulcers.  HEAD and Neck: without JVD, Head and scalp: normal Eyes:No scleral icterus. Fundi normal, eye movements  normal. Ears: Auricle normal, canal normal, Tympanic membranes normal, insufflation normal. Nose: normal Throat: normal Neck & thyroid: normal  CHEST & LUNGS: Chest wall: normal Lungs: Clear  CVS: Reveals the PMI to be normally located. Regular rhythm, First and Second Heart sounds are normal,  absence of murmurs, rubs or gallops.  ABDOMEN:  Appearance: normal Benign, no organomegaly, no masses, no Abdominal Aortic enlargement. No Guarding , no rebound. No Bruits. Bowel sounds: normal  RECTAL: N/A GU: N/A  EXTREMETIES: 4+ edema of the left leg up to the knees. Right leg 2-3 + edema up to knees. [name]  NEUROLOGIC: oriented to ,place and person; nonfocal.  Results for orders placed in visit on 05/22/13  POCT INR      Result Value Range   INR 2.3      ASSESSMENT: DVT (deep venous thrombosis), left - Plan: Protime-INR, POCT INR, CANCELED: Protime-INR  AAA (abdominal aortic aneurysm) without rupture  Chronic venous insufficiency  Cognitive impairment  PLAN:  Take a half (1/2) tablet of the coumadin/warfarin =3.75 mg on Fridays Take 1 ( whole ) tablet = 7.5 mg daily all the other days. Return in 1 week to check the protime.   Orders Placed This Encounter  Procedures  . Protime-INR    Standing Status: Standing     Number of Occurrences: 1     Standing Expiration Date:   . POCT INR   No orders of the defined types were placed in this encounter.   Medications Discontinued During This Encounter  Medication Reason  . enoxaparin (LOVENOX) 150 MG/ML injection Completed Course   Return in about 1 week (around 05/29/2013) for recheck protime.  Jahnyla Parrillo P. Modesto Charon, M.D.

## 2013-05-31 ENCOUNTER — Ambulatory Visit: Payer: Medicare Other | Admitting: Family Medicine

## 2013-05-31 ENCOUNTER — Encounter: Payer: Self-pay | Admitting: Family Medicine

## 2013-05-31 ENCOUNTER — Ambulatory Visit (INDEPENDENT_AMBULATORY_CARE_PROVIDER_SITE_OTHER): Payer: Medicare Other | Admitting: Family Medicine

## 2013-05-31 VITALS — BP 154/68 | HR 89 | Temp 97.6°F | Ht 79.0 in | Wt 327.0 lb

## 2013-05-31 DIAGNOSIS — I82409 Acute embolism and thrombosis of unspecified deep veins of unspecified lower extremity: Secondary | ICD-10-CM

## 2013-05-31 DIAGNOSIS — F0390 Unspecified dementia without behavioral disturbance: Secondary | ICD-10-CM

## 2013-05-31 DIAGNOSIS — I872 Venous insufficiency (chronic) (peripheral): Secondary | ICD-10-CM

## 2013-05-31 DIAGNOSIS — I2510 Atherosclerotic heart disease of native coronary artery without angina pectoris: Secondary | ICD-10-CM

## 2013-05-31 DIAGNOSIS — I739 Peripheral vascular disease, unspecified: Secondary | ICD-10-CM

## 2013-05-31 DIAGNOSIS — F09 Unspecified mental disorder due to known physiological condition: Secondary | ICD-10-CM

## 2013-05-31 DIAGNOSIS — I714 Abdominal aortic aneurysm, without rupture: Secondary | ICD-10-CM

## 2013-05-31 DIAGNOSIS — Z5181 Encounter for therapeutic drug level monitoring: Secondary | ICD-10-CM

## 2013-05-31 DIAGNOSIS — R609 Edema, unspecified: Secondary | ICD-10-CM

## 2013-05-31 DIAGNOSIS — R4189 Other symptoms and signs involving cognitive functions and awareness: Secondary | ICD-10-CM

## 2013-05-31 DIAGNOSIS — Z7901 Long term (current) use of anticoagulants: Secondary | ICD-10-CM

## 2013-05-31 LAB — POCT INR: INR: 3.3

## 2013-05-31 NOTE — Progress Notes (Signed)
Patient ID: [name], male   DOB: 1931/10/22, 79 y.o.   MRN: [id number] SUBJECTIVE: CC: Chief Complaint  Patient presents with  . Follow-up    f/u DVT     HPI: Patient with DVT Dx on 12 / 08/2012 On Warfarin for short term due to AAA. Legs wrapped with a Jones wrap and legs are better. Unable to special order any compression stockings. Doing great   Past Medical History  Diagnosis Date  . Venous insufficiency   . AAA (abdominal aortic aneurysm)   . Ulcer   . DVT (deep venous thrombosis)   . Cellulitis   . Glaucoma   . Nicotine dependence   . Bronchospasm   . Elevated PSA   . BPH (benign prostatic hyperplasia)   . COPD (chronic obstructive pulmonary disease)   . Cognitive impairment    Past Surgical History  Procedure Laterality Date  . Back surgery    . Insitu percutaneous pinning femur Right 1986  . Cholecystectomy    . Hip surgery Right 2009  . Back surgery  2004  . Spine surgery     History   Social History  . Marital Status: Married    Spouse Name: N/A    Number of Children: N/A  . Years of Education: N/A   Occupational History  . Not on file.   Social History Main Topics  . Smoking status: Former Smoker    Types: Cigarettes    Quit date: 08/24/2002  . Smokeless tobacco: Current User    Types: Snuff     Comment: dips snuff  . Alcohol Use: No  . Drug Use: No  . Sexual Activity: Not on file   Other Topics Concern  . Not on file   Social History Narrative  . No narrative on file   Family History  Problem Relation Age of Onset  . Aneurysm Brother   . Heart attack Brother     10-29-12  . Cancer Father     colon   Current Outpatient Prescriptions on File Prior to Visit  Medication Sig Dispense Refill  . doxazosin (CARDURA) 8 MG tablet Take 8 mg by mouth at bedtime.      . finasteride (PROSCAR) 5 MG tablet Take 5 mg by mouth at bedtime.       [name] latanoprost (XALATAN) 0.005 % ophthalmic solution Place 1 drop into both eyes at bedtime.       .  torsemide (DEMADEX) 10 MG tablet Take 1 tablet (10 mg total) by mouth daily.  30 tablet  3   No current facility-administered medications on file prior to visit.   Allergies  Allergen Reactions  . Oxycodone Hcl     REACTION: makes pt "wild"  . Propoxyphene N-Acetaminophen     REACTION: Makes pt. "wild"   Immunization History  Administered Date(s) Administered  . Influenza,inj,Quad PF,36+ Mos 03/20/2013  . Pneumococcal Polysaccharide-23 03/02/2007  . Td 12/30/1996   Prior to Admission medications   Medication Sig Start Date End Date Taking? Authorizing Provider  doxazosin (CARDURA) 8 MG tablet Take 8 mg by mouth at bedtime.    Historical Provider, MD  finasteride (PROSCAR) 5 MG tablet Take 5 mg by mouth at bedtime.     Historical Provider, MD  latanoprost (XALATAN) 0.005 % ophthalmic solution Place 1 drop into both eyes at bedtime.  01/25/12   Historical Provider, MD  torsemide (DEMADEX) 10 MG tablet Take 1 tablet (10 mg total) by mouth daily. 04/13/13   Dorinda Hill  Hoy Register, MD  warfarin (COUMADIN) 7.5 MG tablet Take 1 tablet (7.5 mg total) by mouth daily at 6 PM. 05/05/13   Erick Blinks, MD     ROS: As above in the HPI. All other systems are stable or negative.  OBJECTIVE: APPEARANCE:  Patient in no acute distress.The patient appeared well nourished and normally developed. Acyanotic. Waist: VITAL SIGNS:BP 154/68  Pulse 89  Temp(Src) 97.6 F (36.4 C) (Oral)  Ht 6\' 7"  (2.007 m)  Wt 327 lb (148.326 kg)  BMI 36.82 kg/m2  Obese large built WM  SKIN: warm and  Dry without overt rashes, tattoos and scars  HEAD and Neck: without JVD, Head and scalp: normal Eyes:No scleral icterus. Fundi normal, eye movements normal. Ears: Auricle normal, canal normal, Tympanic membranes normal, insufflation normal. Nose: normal Throat: normal Neck & thyroid: normal  CHEST & LUNGS: Chest wall: normal Lungs: Clear  CVS: Reveals the PMI to be normally located. Regular rhythm, First and  Second Heart sounds are normal,  absence of murmurs, rubs or gallops. Peripheral vasculature: Radial pulses: normal  ABDOMEN:  Appearance: Obese Benign, no organomegaly, no masses, no Abdominal Aortic enlargement. No Guarding , no rebound. No Bruits. Bowel sounds: normal  RECTAL: N/A GU: N/A  EXTREMETIES: 3 + edema.  NEUROLOGIC: oriented to time,place and person; nonfocal.   ASSESSMENT: Leg DVT (deep venous thromboembolism), acute, unspecified laterality - Plan: POCT INR  Chronic venous insufficiency  Edema-Bilat Leg and foot  Cognitive impairment  Peripheral vascular disease, unspecified  Dementia  Anticoagulation goal of INR 2 to 3 - Plan: warfarin (COUMADIN) 7.5 MG tablet  PLAN:  Orders Placed This Encounter  Procedures  . POCT INR   Results for orders placed in visit on 05/31/13  POCT INR      Result Value Range   INR 3.3      Meds ordered this encounter  Medications  . DISCONTD: warfarin (COUMADIN) 7.5 MG tablet    Sig: Take 7.5 mg by mouth daily at 6 PM. 1/2 tab on Fridays and 1 tab all the other days.  [name] warfarin (COUMADIN) 7.5 MG tablet    Sig: Take 1 tablet (7.5 mg total) by mouth daily at 6 PM. Except 1/2 tab on Wednesdays and Saturdays    Dispense:  30 tablet    Refill:  0   Medications Discontinued During This Encounter  Medication Reason  . warfarin (COUMADIN) 7.5 MG tablet   . warfarin (COUMADIN) 7.5 MG tablet Reorder  discussed HHnurse follow up on PT/warfarin. Dose adjustment. Jones wrap both legs, which works.. done today Return for recheck protime via [facility] nurse in 1 week.  Bruk Tumolo P. Modesto Charon, M.D.

## 2013-06-01 DIAGNOSIS — Z7901 Long term (current) use of anticoagulants: Secondary | ICD-10-CM

## 2013-06-01 DIAGNOSIS — Z5181 Encounter for therapeutic drug level monitoring: Secondary | ICD-10-CM | POA: Insufficient documentation

## 2013-06-01 HISTORY — DX: Encounter for therapeutic drug level monitoring: Z51.81

## 2013-06-01 HISTORY — DX: Long term (current) use of anticoagulants: Z79.01

## 2013-06-05 ENCOUNTER — Telehealth: Payer: Self-pay | Admitting: *Deleted

## 2013-06-07 ENCOUNTER — Ambulatory Visit (INDEPENDENT_AMBULATORY_CARE_PROVIDER_SITE_OTHER): Payer: Medicare Other | Admitting: Pharmacist

## 2013-06-07 ENCOUNTER — Telehealth: Payer: Self-pay | Admitting: *Deleted

## 2013-06-07 DIAGNOSIS — I82409 Acute embolism and thrombosis of unspecified deep veins of unspecified lower extremity: Secondary | ICD-10-CM

## 2013-06-07 LAB — POCT INR: INR: 3.2

## 2013-06-07 NOTE — Telephone Encounter (Signed)
Pt 38.0 inr 3.2   7mg  tablets of coumadin. Takes 1/2 tab on wed and sat and 1 tab all other days

## 2013-06-07 NOTE — Progress Notes (Signed)
Patient's INR is checking through [facility] Billing once per month interupertation fee.  Patient diagnosis - venous thromboembolism / DVT Procedure code if [id number]

## 2013-06-07 NOTE — Telephone Encounter (Signed)
INR slightly supratherapeutic.  Current dose is actually 7.5mg  - 1 tablet daily except 1/2 tablet on Wednesdays and Saturdays.  Recommend - no warfarin today, then decrease dose to 1/2 tablet on Mondays, Wednesdays and Fridays and 1 tablet all other days. Recheck INR 06/12/13

## 2013-06-09 NOTE — Telephone Encounter (Signed)
done

## 2013-06-12 ENCOUNTER — Telehealth: Payer: Self-pay | Admitting: *Deleted

## 2013-06-12 ENCOUNTER — Ambulatory Visit (INDEPENDENT_AMBULATORY_CARE_PROVIDER_SITE_OTHER): Payer: Medicare Other | Admitting: Pharmacist

## 2013-06-12 LAB — POCT INR: INR: 2.5

## 2013-06-12 NOTE — Telephone Encounter (Signed)
Protime 30.2, INR 2.5 Takes 1/2 of 7.5 mg  On mon, wed, and Fri. Takes whole tab all other days

## 2013-06-21 ENCOUNTER — Ambulatory Visit (INDEPENDENT_AMBULATORY_CARE_PROVIDER_SITE_OTHER): Payer: Medicare Other | Admitting: Pharmacist

## 2013-06-21 ENCOUNTER — Telehealth: Payer: Self-pay | Admitting: *Deleted

## 2013-06-21 LAB — POCT INR: INR: 2.4

## 2013-06-21 NOTE — Progress Notes (Signed)
INR check by Home Health - no charge for anticoag visit

## 2013-06-21 NOTE — Telephone Encounter (Signed)
Continue current warfarin dose - recheck INR in 1 week. Patient and Inetta Fermoina with [facility] notified of resulsts and warfarin dose and order to recheck INR given.

## 2013-06-21 NOTE — Telephone Encounter (Signed)
PT    29.3 INR   2.4  He takes 7.5 tablets he takes 1/2 on MWF and a Whole all other days

## 2013-06-28 ENCOUNTER — Ambulatory Visit (INDEPENDENT_AMBULATORY_CARE_PROVIDER_SITE_OTHER): Payer: Medicare Other | Admitting: Pharmacist

## 2013-06-28 ENCOUNTER — Telehealth: Payer: Self-pay | Admitting: *Deleted

## 2013-06-28 LAB — POCT INR: INR: 2.2

## 2013-06-28 NOTE — Telephone Encounter (Signed)
See anticoagulation note for instructions - no changes in dose.  Patient and Inetta Fermoina with [facility] notified.  Ordered Protime recheck for 1 week.

## 2013-06-28 NOTE — Telephone Encounter (Signed)
[facility] [place] [facility] 26.8, INR 2.2    Patient has 7.5mg  tab. Taking 1/2 tablet M,W,F and 1 all other days per Inetta Fermoina. Call Inetta Fermoina back at [phone]

## 2013-06-28 NOTE — Telephone Encounter (Signed)
[name] wanted to know if he could wait for followup appt for 1-2 weeks with you. Wife is sick and he just had protime with [facility]. Please advise.

## 2013-06-28 NOTE — Telephone Encounter (Signed)
Tina notified.

## 2013-06-28 NOTE — Telephone Encounter (Signed)
Can wait for appointment. Zyiah Withington P. Modesto CharonWong, M.D.

## 2013-06-29 ENCOUNTER — Ambulatory Visit: Payer: Medicare Other | Admitting: Family Medicine

## 2013-07-04 ENCOUNTER — Telehealth: Payer: Self-pay | Admitting: *Deleted

## 2013-07-04 LAB — POCT INR: INR: 2.1

## 2013-07-04 NOTE — Telephone Encounter (Signed)
Protime 25.3 INR 2.1 Call tina, she knows it will be Wed. 07/05/13 when you call

## 2013-07-05 ENCOUNTER — Ambulatory Visit: Payer: Self-pay | Admitting: Pharmacist

## 2013-07-05 NOTE — Progress Notes (Signed)
INR checked by Home [facility] and called to our office for adjustment. Billing once per month interupertation fee.  Patient diagnosis - venous thromboembolism/DVT Procedure code if G0250  Per Inetta Fermoina - [facility] is finished with home care visits.  Next protime check will need to be in our office in 1 month- called to make appt and LM for patient to call.

## 2013-07-17 ENCOUNTER — Other Ambulatory Visit: Payer: Self-pay

## 2013-07-17 DIAGNOSIS — Z5181 Encounter for therapeutic drug level monitoring: Secondary | ICD-10-CM

## 2013-07-17 DIAGNOSIS — Z7901 Long term (current) use of anticoagulants: Principal | ICD-10-CM

## 2013-07-17 MED ORDER — WARFARIN SODIUM 7.5 MG PO TABS: 7.5000 mg | ORAL_TABLET | Freq: Every day | ORAL | Status: DC

## 2013-08-03 ENCOUNTER — Ambulatory Visit (INDEPENDENT_AMBULATORY_CARE_PROVIDER_SITE_OTHER): Payer: Medicare Other | Admitting: Pharmacist

## 2013-08-03 DIAGNOSIS — I82402 Acute embolism and thrombosis of unspecified deep veins of left lower extremity: Secondary | ICD-10-CM

## 2013-08-03 DIAGNOSIS — L03115 Cellulitis of right lower limb: Secondary | ICD-10-CM

## 2013-08-03 DIAGNOSIS — W19XXXA Unspecified fall, initial encounter: Secondary | ICD-10-CM

## 2013-08-03 DIAGNOSIS — L03119 Cellulitis of unspecified part of limb: Secondary | ICD-10-CM

## 2013-08-03 DIAGNOSIS — L03116 Cellulitis of left lower limb: Secondary | ICD-10-CM

## 2013-08-03 DIAGNOSIS — I82409 Acute embolism and thrombosis of unspecified deep veins of unspecified lower extremity: Secondary | ICD-10-CM

## 2013-08-03 DIAGNOSIS — Y92009 Unspecified place in unspecified non-institutional (private) residence as the place of occurrence of the external cause: Secondary | ICD-10-CM

## 2013-08-03 DIAGNOSIS — L02419 Cutaneous abscess of limb, unspecified: Secondary | ICD-10-CM

## 2013-08-03 DIAGNOSIS — Z86718 Personal history of other venous thrombosis and embolism: Secondary | ICD-10-CM | POA: Insufficient documentation

## 2013-08-03 DIAGNOSIS — R609 Edema, unspecified: Secondary | ICD-10-CM

## 2013-08-03 LAB — POCT INR: INR: 2.2

## 2013-08-03 MED ORDER — TORSEMIDE 10 MG PO TABS: 10.0000 mg | ORAL_TABLET | Freq: Every day | ORAL | Status: DC

## 2013-08-03 NOTE — Progress Notes (Signed)
Patient's wife mentioned that his physician had discussed stopping warfarin in March 2015.  Spoke with Dr Modesto CharonWong and due to patient's AAA he feel that 3 months of anticoagulation is enough given risk of possible bleeding.  Warfarin discontinued today.  Also sent in rx for torsemide at patien't s request.  During visit patient revealed that he felt his muscles were weak and that he fell today (did not hit head for fall hard as his son was beside him to help) when he was coming to today's appt - Discussed with Dr Modesto CharonWong and  referral made for home PT for strengthening and balance assessment due to decompensation and recent fall.

## 2013-08-08 ENCOUNTER — Telehealth: Payer: Self-pay | Admitting: Family Medicine

## 2013-08-09 ENCOUNTER — Telehealth: Payer: Self-pay

## 2013-08-09 NOTE — Telephone Encounter (Signed)
Phone call from family member; reported pt. has had increased weakness in legs over past week.  Stated pt. fell x2 last week.  Stated "he can hardly stand-up without holding onto something."  Denies dizziness, fever/chills, or nausea/vomiting.  Reported he drinks water well.  Stated he was on Coumadin from December-March, and recently told to stop the coumadin by his PCP.   Questioned if there could be a leakage from his abdominal aneurysm, causing him to be so weak?  Advised that pt. should notify his PCP of the increased weakness for further evaluation.  Wife stated pt. has an appt. on 3/17 with Dr. Modesto CharonWong.  Advised her to call the PCP today, and see if pt. Can be evaluated sooner than next Tuesday.  Verb. Understanding. Agreed to call PCP.

## 2013-08-09 NOTE — Telephone Encounter (Signed)
Clarified that patient has an appt on Tuesday 08/15/13 not 08/10/13.  Daughter is not able to come to his appt with him. She is concerned that his health has decreased drastically since having the DVT and that his movements are much slower. He had two falls last week within the same day. No falls this week.   She is also concerned that the blood thinner may cause problems with his AAA. She asked her mother to speak with [facility] vascular surgeon about this concern.   If Alona BeneJoyce is unable to attend his appt can you call her after his appt on Tuesday to discuss her concerns?

## 2013-08-15 ENCOUNTER — Telehealth: Payer: Self-pay | Admitting: Family Medicine

## 2013-08-15 ENCOUNTER — Ambulatory Visit (INDEPENDENT_AMBULATORY_CARE_PROVIDER_SITE_OTHER): Payer: Medicare Other | Admitting: Family Medicine

## 2013-08-15 ENCOUNTER — Encounter: Payer: Self-pay | Admitting: Family Medicine

## 2013-08-15 VITALS — BP 133/63 | HR 67 | Temp 98.2°F | Ht 79.0 in

## 2013-08-15 DIAGNOSIS — E669 Obesity, unspecified: Secondary | ICD-10-CM

## 2013-08-15 DIAGNOSIS — I872 Venous insufficiency (chronic) (peripheral): Secondary | ICD-10-CM

## 2013-08-15 DIAGNOSIS — J449 Chronic obstructive pulmonary disease, unspecified: Secondary | ICD-10-CM

## 2013-08-15 DIAGNOSIS — Y92009 Unspecified place in unspecified non-institutional (private) residence as the place of occurrence of the external cause: Secondary | ICD-10-CM

## 2013-08-15 DIAGNOSIS — L03119 Cellulitis of unspecified part of limb: Secondary | ICD-10-CM

## 2013-08-15 DIAGNOSIS — W19XXXA Unspecified fall, initial encounter: Secondary | ICD-10-CM

## 2013-08-15 DIAGNOSIS — I714 Abdominal aortic aneurysm, without rupture, unspecified: Secondary | ICD-10-CM

## 2013-08-15 DIAGNOSIS — R609 Edema, unspecified: Secondary | ICD-10-CM

## 2013-08-15 DIAGNOSIS — F0390 Unspecified dementia without behavioral disturbance: Secondary | ICD-10-CM

## 2013-08-15 DIAGNOSIS — L02419 Cutaneous abscess of limb, unspecified: Secondary | ICD-10-CM

## 2013-08-15 DIAGNOSIS — L97919 Non-pressure chronic ulcer of unspecified part of right lower leg with unspecified severity: Secondary | ICD-10-CM

## 2013-08-15 DIAGNOSIS — L03116 Cellulitis of left lower limb: Secondary | ICD-10-CM

## 2013-08-15 DIAGNOSIS — R29898 Other symptoms and signs involving the musculoskeletal system: Secondary | ICD-10-CM

## 2013-08-15 DIAGNOSIS — L97929 Non-pressure chronic ulcer of unspecified part of left lower leg with unspecified severity: Secondary | ICD-10-CM

## 2013-08-15 DIAGNOSIS — I739 Peripheral vascular disease, unspecified: Secondary | ICD-10-CM

## 2013-08-15 DIAGNOSIS — R600 Localized edema: Secondary | ICD-10-CM

## 2013-08-15 DIAGNOSIS — L03115 Cellulitis of right lower limb: Secondary | ICD-10-CM

## 2013-08-15 DIAGNOSIS — R4189 Other symptoms and signs involving cognitive functions and awareness: Secondary | ICD-10-CM

## 2013-08-15 DIAGNOSIS — L97909 Non-pressure chronic ulcer of unspecified part of unspecified lower leg with unspecified severity: Secondary | ICD-10-CM

## 2013-08-15 DIAGNOSIS — I82409 Acute embolism and thrombosis of unspecified deep veins of unspecified lower extremity: Secondary | ICD-10-CM

## 2013-08-15 DIAGNOSIS — F09 Unspecified mental disorder due to known physiological condition: Secondary | ICD-10-CM

## 2013-08-15 LAB — POCT CBC
Granulocyte percent: 73.1 %G (ref 37–80)
HCT, POC: 42.1 % — AB (ref 43.5–53.7)
Hemoglobin: 13.7 g/dL — AB (ref 14.1–18.1)
Lymph, poc: 2.1 (ref 0.6–3.4)
MCH, POC: 29.3 pg (ref 27–31.2)
MCHC: 32.6 g/dL (ref 31.8–35.4)
MCV: 90 fL (ref 80–97)
MPV: 8.5 fL (ref 0–99.8)
POC Granulocyte: 6.2 (ref 2–6.9)
POC LYMPH PERCENT: 24.7 %L (ref 10–50)
Platelet Count, POC: 225 10*3/uL (ref 142–424)
RBC: 4.7 M/uL (ref 4.69–6.13)
RDW, POC: 16.2 %
WBC: 8.5 10*3/uL (ref 4.6–10.2)

## 2013-08-15 MED ORDER — TORSEMIDE 10 MG PO TABS: 10.0000 mg | ORAL_TABLET | Freq: Every day | ORAL | Status: DC

## 2013-08-15 NOTE — Progress Notes (Signed)
Patient ID: [name], male   DOB: May 28, 1932, 80 y.o.   MRN: [id number] SUBJECTIVE: CC: Chief Complaint  Patient presents with  . Follow-up    wants to go on ibpruofen  says tylenol does not help. has had couple fall  last fall was march 5 . wants home health visits.   . Medication Refill    refill  demadex    HPI: Brought by nephew and wife today. Has been falling. He has been having his legs up since the DVT and the swelling is back to baseline. Also, with the weather he has not been moving. Just sitting around and his legs are weak and he has a tendency to fall. [place] a couple of times and he skinned his right knee. No back pain No numbness in the legs. The legs just feels weak and unable to support his weight.  He completed the 3 months anticoagulation for DVT. Past Medical History  Diagnosis Date  . Venous insufficiency   . AAA (abdominal aortic aneurysm)   . Ulcer   . DVT (deep venous thrombosis)   . Cellulitis   . Glaucoma   . Nicotine dependence   . Bronchospasm   . Elevated PSA   . BPH (benign prostatic hyperplasia)   . COPD (chronic obstructive pulmonary disease)   . Cognitive impairment    Past Surgical History  Procedure Laterality Date  . Back surgery    . Insitu percutaneous pinning femur Right 1986  . Cholecystectomy    . Hip surgery Right 2009  . Back surgery  2004  . Spine surgery     History   Social History  . Marital Status: Married    Spouse Name: N/A    Number of Children: N/A  . Years of Education: N/A   Occupational History  . Not on file.   Social History Main Topics  . Smoking status: Former Smoker    Types: Cigarettes    Quit date: 08/24/2002  . Smokeless tobacco: Current User    Types: Snuff     Comment: dips snuff  . Alcohol Use: No  . Drug Use: No  . Sexual Activity: Not on file   Other Topics Concern  . Not on file   Social History Narrative  . No narrative on file   Family History  Problem Relation Age of Onset  .  Aneurysm Brother   . Heart attack Brother     10-29-12  . Cancer Father     colon   Current Outpatient Prescriptions on File Prior to Visit  Medication Sig Dispense Refill  . doxazosin (CARDURA) 8 MG tablet Take 8 mg by mouth at bedtime.      . finasteride (PROSCAR) 5 MG tablet Take 5 mg by mouth at bedtime.       [name] latanoprost (XALATAN) 0.005 % ophthalmic solution Place 1 drop into both eyes at bedtime.       . torsemide (DEMADEX) 10 MG tablet Take 1 tablet (10 mg total) by mouth daily.  90 tablet  1   No current facility-administered medications on file prior to visit.   Allergies  Allergen Reactions  . Oxycodone Hcl     REACTION: makes pt "wild"  . Propoxyphene N-Acetaminophen     REACTION: Makes pt. "wild"   Immunization History  Administered Date(s) Administered  . Influenza,inj,Quad PF,36+ Mos 03/20/2013  . Pneumococcal Polysaccharide-23 03/02/2007  . Td 12/30/1996   Prior to Admission medications   Medication Sig Start  Date End Date Taking? Authorizing Provider  doxazosin (CARDURA) 8 MG tablet Take 8 mg by mouth at bedtime.    Historical Provider, MD  finasteride (PROSCAR) 5 MG tablet Take 5 mg by mouth at bedtime.     Historical Provider, MD  latanoprost (XALATAN) 0.005 % ophthalmic solution Place 1 drop into both eyes at bedtime.  01/25/12   Historical Provider, MD  torsemide (DEMADEX) 10 MG tablet Take 1 tablet (10 mg total) by mouth daily. 08/03/13   Tammy Eckard, PHARMD     ROS: As above in the HPI. All other systems are stable or negative.  OBJECTIVE: APPEARANCE:  Patient in no acute distress.The patient appeared well nourished and normally developed. Acyanotic. Waist: VITAL SIGNS:BP 133/63  Pulse 67  Temp(Src) 98.2 F (36.8 C) (Oral)  Ht $R'6\' 7"'Ni$  (2.007 m)  SpO2 97%  Obese WM in a wheelchair.  SKIN: warm and  Dry without , tattoos a. Has  Stasis dermatitis of both lower legs and a healing abrasion of the right knee.  HEAD and Neck: without JVD, Head  and scalp: normal Eyes:No scleral icterus. Fundi normal, eye movements normal. Ears: Auricle normal, canal normal, Tympanic membranes normal, insufflation normal. Nose: normal Throat: normal Neck & thyroid: normal  CHEST & LUNGS: Chest wall: normal Lungs: Clear  CVS: Reveals the PMI to be normally located. Regular rhythm, First and Second Heart sounds are normal,  absence of murmurs, rubs or gallops. Peripheral vasculature: Radial pulses: normal Dorsal pedis pulses: normal Posterior pulses: normal  ABDOMEN:  Appearance: normal Benign, no organomegaly, no masses, no Abdominal Aortic enlargement. No Guarding , no rebound. No Bruits. Bowel sounds: normal  RECTAL: N/A GU: N/A  EXTREMETIES: 1+ edematous both legs. Which is his baseline. Equal edema.   MUSCULOSKELETAL:  Spine: non tender Joints: genu valgus right greater than left.  NEUROLOGIC: oriented to time,place and person; nonfocal. Strength is4.5/5 in both lower extremities. Sensory is normal Reflexes is down and equal 1 +  MMSE 29/30  ASSESSMENT:  Peripheral vascular disease, unspecified  Edema-Bilat Leg and foot  Cognitive impairment - Plan: POCT CBC, CMP14+EGFR, TSH, RPR  Chronic venous insufficiency  AAA (abdominal aortic aneurysm) without rupture  Leg DVT (deep venous thromboembolism), acute  Pedal edema  Dementia - Plan: HIV antibody  COPD (chronic obstructive pulmonary disease)  Ulcers of both lower legs  Obesity, unspecified  Weakness of both legs - Plan: POCT CBC, CMP14+EGFR, TSH, RPR, Ambulatory referral to [place] at home - Plan: POCT CBC, Ambulatory referral to [place] [facility] patient got severely deconditioned from elevating legs and being sedentary at home.  PLAN:  Refer to [facility]. To assess fall risks at home & for home PT for strengthening exercises. Left message on answering machine for daughter. Will work up apparent cognitive impairment noted by daughter in  message she has left for me.  Orders Placed This Encounter  Procedures  . CMP14+EGFR  . TSH  . RPR  . HIV antibody  . Ambulatory referral to Home Health    Referral Priority:  Routine    Referral Type:  Consultation    Referral Reason:  Specialty Services Required    Requested Specialty:  [place]    Number of Visits Requested:  1  . POCT CBC   Meds ordered this encounter  Medications  . DISCONTD: warfarin (COUMADIN) 7.5 MG tablet    Sig:    Medications Discontinued During This Encounter  Medication Reason  . warfarin (COUMADIN) 7.5  MG tablet Discontinued by provider   Return in about 4 weeks (around 09/12/2013) for Recheck medical problems.  Andrey Hoobler P. Jacelyn Grip, M.D.

## 2013-08-16 LAB — TSH: TSH: 2.39 u[IU]/mL (ref 0.450–4.500)

## 2013-08-16 LAB — CMP14+EGFR
ALT: 8 IU/L (ref 0–44)
AST: 12 IU/L (ref 0–40)
Albumin/Globulin Ratio: 1.9 (ref 1.1–2.5)
Albumin: 4.2 g/dL (ref 3.5–4.8)
Alkaline Phosphatase: 74 IU/L (ref 39–117)
BUN/Creatinine Ratio: 14 (ref 10–22)
BUN: 16 mg/dL (ref 8–27)
CO2: 27 mmol/L (ref 18–29)
Calcium: 9.7 mg/dL (ref 8.6–10.2)
Chloride: 105 mmol/L (ref 97–108)
Creatinine, Ser: 1.15 mg/dL (ref 0.76–1.27)
GFR calc Af Amer: 70 mL/min/{1.73_m2} (ref >59)
GFR calc non Af Amer: 60 mL/min/{1.73_m2} (ref >59)
Globulin, Total: 2.2 g/dL (ref 1.5–4.5)
Glucose: 104 mg/dL — ABNORMAL HIGH (ref 65–99)
Potassium: 5.7 mmol/L — ABNORMAL HIGH (ref 3.5–5.2)
Sodium: 147 mmol/L — ABNORMAL HIGH (ref 134–144)
Total Bilirubin: 0.5 mg/dL (ref 0.0–1.2)
Total Protein: 6.4 g/dL (ref 6.0–8.5)

## 2013-08-16 LAB — HIV ANTIBODY (ROUTINE TESTING W REFLEX)
HIV 1/O/2 Abs-Index Value: <1 (ref <1.00)
HIV-1/HIV-2 Ab: NONREACTIVE

## 2013-08-16 LAB — RPR: RPR: NONREACTIVE

## 2013-08-17 NOTE — Telephone Encounter (Signed)
Returned UnitedHealth[employer]ce's call. Discussed the treatment plan and work up. [facility].

## 2013-08-18 NOTE — Progress Notes (Signed)
[facility]

## 2013-08-21 NOTE — Progress Notes (Signed)
[facility], due [facility]. [facility] made [facility] today

## 2013-08-28 ENCOUNTER — Telehealth: Payer: Self-pay | Admitting: Family Medicine

## 2013-08-28 ENCOUNTER — Other Ambulatory Visit: Payer: Self-pay | Admitting: Family Medicine

## 2013-08-28 MED ORDER — MELOXICAM 15 MG PO TABS: 15.0000 mg | ORAL_TABLET | Freq: Every day | ORAL | Status: DC

## 2013-08-28 NOTE — Telephone Encounter (Signed)
Call patient :call home health. Prescription refilled & sent to pharmacy in [place].for meloxicam which is safer

## 2013-08-28 NOTE — Telephone Encounter (Signed)
Becky from [facility] notified and she stated she would call pt

## 2013-08-29 ENCOUNTER — Telehealth: Payer: Self-pay | Admitting: Family Medicine

## 2013-08-29 NOTE — Telephone Encounter (Signed)
Spoke with daughter Alona BeneJoyce. She reports he is saying "alot of sexual sayings" more so in the last 30 days . They are not ready to start to aricept . When asked what can we do to help in this situation she responded  " I was calling to check his labs. " and to express my concerns regarding him.  Daughter advised to keep a journal of his behavior as well as inappropriate sayings and bring with her to his appt. Safety issues discussed as well.   FOLLOW UP APPT IS September 15 2013 AND ADVISED HER TO TRY TO COME TO HIS APPT

## 2013-08-29 NOTE — Telephone Encounter (Signed)
They want to know if he had any changes on dementia or alzheimer really wants to speak with gina or Dr.wong

## 2013-09-07 ENCOUNTER — Ambulatory Visit: Payer: Medicare Other | Admitting: Nurse Practitioner

## 2013-09-07 ENCOUNTER — Telehealth: Payer: Self-pay | Admitting: Family Medicine

## 2013-09-07 NOTE — Telephone Encounter (Signed)
Pain in left calf above ankle. He is doing physical therapy at home. PT was there yesterday and it could be related to that but they are also concerned about a blood clot.  Appt scheduled for today with [name] to evaluate.

## 2013-09-07 NOTE — Telephone Encounter (Signed)
Patient refuses appt for today. He doesn't want to sit and wait. Discussed with daughter and they will call 911 if symptoms worsen. Otherwise apprt moved to tomorrow morning with Ander SladeBill Oxford, FNP. They aware of the seriousness of a DVT and that typical s/s may not always be present.

## 2013-09-08 ENCOUNTER — Ambulatory Visit (HOSPITAL_COMMUNITY)
Admission: RE | Admit: 2013-09-08 | Discharge: 2013-09-08 | Disposition: A | Discharge status: Home / Self Care | Payer: Medicare Other | Source: Ambulatory Visit | Attending: Family Medicine | Admitting: Family Medicine

## 2013-09-08 ENCOUNTER — Ambulatory Visit (INDEPENDENT_AMBULATORY_CARE_PROVIDER_SITE_OTHER): Payer: Medicare Other | Admitting: Family Medicine

## 2013-09-08 ENCOUNTER — Encounter: Payer: Self-pay | Admitting: Family Medicine

## 2013-09-08 ENCOUNTER — Other Ambulatory Visit: Payer: Self-pay

## 2013-09-08 VITALS — BP 131/62 | HR 64 | Temp 98.9°F | Ht 79.0 in

## 2013-09-08 DIAGNOSIS — Z86718 Personal history of other venous thrombosis and embolism: Secondary | ICD-10-CM | POA: Insufficient documentation

## 2013-09-08 DIAGNOSIS — R609 Edema, unspecified: Secondary | ICD-10-CM

## 2013-09-08 DIAGNOSIS — M7989 Other specified soft tissue disorders: Secondary | ICD-10-CM | POA: Insufficient documentation

## 2013-09-08 DIAGNOSIS — O2230 Deep phlebothrombosis in pregnancy, unspecified trimester: Secondary | ICD-10-CM

## 2013-09-08 DIAGNOSIS — I82409 Acute embolism and thrombosis of unspecified deep veins of unspecified lower extremity: Secondary | ICD-10-CM

## 2013-09-08 MED ORDER — ENOXAPARIN SODIUM 150 MG/ML ~~LOC~~ SOLN: 150.0000 mg | Freq: Two times a day (BID) | SUBCUTANEOUS | Status: DC

## 2013-09-08 MED ORDER — WARFARIN SODIUM 7.5 MG PO TABS: 7.5000 mg | ORAL_TABLET | Freq: Every day | ORAL | Status: DC

## 2013-09-08 NOTE — Progress Notes (Signed)
   Subjective:    Patient ID: [name], male    DOB: [birth year], 80 y.o.   MRN: [id number]  HPI This 80 y.o. male presents for evaluation of left lower extremity edema and pain.  He has been having Problems with DVT in past and was on coumadin for 3 month course due to AAA and risk of  Bleeding with anticoagulants.  He has hx of LEE and decreased mobility.   Review of Systems C/o left lower extremity edema   No chest pain, SOB, HA, dizziness, vision change, N/V, diarrhea, constipation, dysuria, urinary urgency or frequency, myalgias, arthralgias or rash.  Objective:   Physical Exam Vital signs noted  Well developed well nourished male.  HEENT - Head atraumatic Normocephalic                Eyes - PERRLA, Conjuctiva - clear Sclera- Clear EOMI                Ears - EAC's Wnl TM's Wnl Gross Hearing WNL                Throat - oropharanx wnl Respiratory - Lungs CTA bilateral Cardiac - RRR S1 and S2 without murmur GI - Abdomen soft Nontender and bowel sounds active x 4      Assessment & Plan:  Edema - Plan: Lower Extremity Venous Duplex Bilateral stat  Deatra CanterWilliam J Antionetta Ator FNP

## 2013-09-08 NOTE — Addendum Note (Signed)
Addended by: Deatra CanterXFORD, WILLIAM J on: 09/08/2013 04:09 PM   Modules accepted: Orders

## 2013-09-08 NOTE — Progress Notes (Addendum)
   Subjective:    Patient ID: [name], male    DOB: 10-09-1931, 80 y.o.   MRN: [id number]  HPI    Review of Systems     Objective:   Physical Exam        Assessment & Plan:   Edema - Plan: Lower Extremity Venous Duplex Bilateral, warfarin (COUMADIN) 7.5 MG tablet, enoxaparin (LOVENOX) 150 MG/ML injection  DVT (deep vein thrombosis) in pregnancy - Plan: warfarin (COUMADIN) 7.5 MG tablet, enoxaparin (LOVENOX) 150 MG/ML injection  [facility] called and patient has DVT in left lower extremity and he Is going to be bridged with lovenox and started on coumadin today and instructed daughter to have Him go to pharmacy to pick up meds and then follow up Monday for ptinr.  Deatra CanterWilliam J Banks Chaikin FNP

## 2013-09-11 ENCOUNTER — Other Ambulatory Visit: Payer: Self-pay | Admitting: Family Medicine

## 2013-09-11 ENCOUNTER — Encounter: Payer: Self-pay | Admitting: Family Medicine

## 2013-09-11 ENCOUNTER — Ambulatory Visit (INDEPENDENT_AMBULATORY_CARE_PROVIDER_SITE_OTHER): Payer: Medicare Other | Admitting: Family Medicine

## 2013-09-11 VITALS — BP 133/60 | HR 56 | Temp 98.0°F | Ht 79.0 in

## 2013-09-11 DIAGNOSIS — O2230 Deep phlebothrombosis in pregnancy, unspecified trimester: Secondary | ICD-10-CM

## 2013-09-11 DIAGNOSIS — Z5181 Encounter for therapeutic drug level monitoring: Secondary | ICD-10-CM

## 2013-09-11 DIAGNOSIS — R609 Edema, unspecified: Secondary | ICD-10-CM

## 2013-09-11 DIAGNOSIS — F09 Unspecified mental disorder due to known physiological condition: Secondary | ICD-10-CM

## 2013-09-11 DIAGNOSIS — J449 Chronic obstructive pulmonary disease, unspecified: Secondary | ICD-10-CM

## 2013-09-11 DIAGNOSIS — I82409 Acute embolism and thrombosis of unspecified deep veins of unspecified lower extremity: Secondary | ICD-10-CM

## 2013-09-11 DIAGNOSIS — I872 Venous insufficiency (chronic) (peripheral): Secondary | ICD-10-CM

## 2013-09-11 DIAGNOSIS — F0390 Unspecified dementia without behavioral disturbance: Secondary | ICD-10-CM

## 2013-09-11 DIAGNOSIS — I714 Abdominal aortic aneurysm, without rupture, unspecified: Secondary | ICD-10-CM

## 2013-09-11 DIAGNOSIS — I739 Peripheral vascular disease, unspecified: Secondary | ICD-10-CM

## 2013-09-11 DIAGNOSIS — E669 Obesity, unspecified: Secondary | ICD-10-CM

## 2013-09-11 DIAGNOSIS — R600 Localized edema: Secondary | ICD-10-CM

## 2013-09-11 DIAGNOSIS — Z7901 Long term (current) use of anticoagulants: Secondary | ICD-10-CM

## 2013-09-11 DIAGNOSIS — R4189 Other symptoms and signs involving cognitive functions and awareness: Secondary | ICD-10-CM

## 2013-09-11 LAB — POCT INR: INR: 1.2

## 2013-09-11 MED ORDER — ENOXAPARIN SODIUM 150 MG/ML ~~LOC~~ SOLN: 150.0000 mg | Freq: Two times a day (BID) | SUBCUTANEOUS | Status: DC

## 2013-09-11 NOTE — Progress Notes (Signed)
Patient ID: [name], male   DOB: [birth year], 80 y.o.   MRN: [id number] SUBJECTIVE: CC: Chief Complaint  Patient presents with  . Follow-up    lower extremity edema    HPI: His left leg swelled up a lot more than usual last Friday. Was seen by th emid-level and told that he had a recurrence of the DVT. Started on back on Lovenox Friday night and  started back on the warfarin on Saturday morning.  Swelling better.  Past Medical History  Diagnosis Date  . Venous insufficiency   . AAA (abdominal aortic aneurysm)   . Ulcer   . DVT (deep venous thrombosis)   . Cellulitis   . Glaucoma   . Nicotine dependence   . Bronchospasm   . Elevated PSA   . BPH (benign prostatic hyperplasia)   . COPD (chronic obstructive pulmonary disease)   . Cognitive impairment    Past Surgical History  Procedure Laterality Date  . Back surgery    . Insitu percutaneous pinning femur Right 1986  . Cholecystectomy    . Hip surgery Right 2009  . Back surgery  2004  . Spine surgery     History   Social History  . Marital Status: Married    Spouse Name: N/A    Number of Children: N/A  . Years of Education: N/A   Occupational History  . Not on file.   Social History Main Topics  . Smoking status: Former Smoker    Types: Cigarettes    Quit date: 08/24/2002  . Smokeless tobacco: Current User    Types: Snuff     Comment: dips snuff  . Alcohol Use: No  . Drug Use: No  . Sexual Activity: Not on file   Other Topics Concern  . Not on file   Social History Narrative  . No narrative on file   Family History  Problem Relation Age of Onset  . Aneurysm Brother   . Heart attack Brother     10-29-12  . Cancer Father     colon   Current Outpatient Prescriptions on File Prior to Visit  Medication Sig Dispense Refill  . doxazosin (CARDURA) 8 MG tablet Take 8 mg by mouth at bedtime.      . enoxaparin (LOVENOX) 150 MG/ML injection Inject 1 mL (150 mg total) into the skin every 12 (twelve)  hours.  8 Syringe  1  . finasteride (PROSCAR) 5 MG tablet Take 5 mg by mouth at bedtime.       [name]. latanoprost (XALATAN) 0.005 % ophthalmic solution Place 1 drop into both eyes at bedtime.       . meloxicam (MOBIC) 15 MG tablet Take 1 tablet (15 mg total) by mouth daily.  30 tablet  1  . torsemide (DEMADEX) 10 MG tablet Take 1 tablet (10 mg total) by mouth daily.  90 tablet  1  . warfarin (COUMADIN) 7.5 MG tablet Take 1 tablet (7.5 mg total) by mouth daily at 6 PM.  30 tablet  1   No current facility-administered medications on file prior to visit.   Allergies  Allergen Reactions  . Oxycodone Hcl     REACTION: makes pt "wild"  . Propoxyphene N-Acetaminophen     REACTION: Makes pt. "wild"   Immunization History  Administered Date(s) Administered  . Influenza,inj,Quad PF,36+ Mos 03/20/2013  . Pneumococcal Polysaccharide-23 03/02/2007  . Td 12/30/1996   Prior to Admission medications   Medication Sig Start Date End Date Taking? Authorizing  Provider  doxazosin (CARDURA) 8 MG tablet Take 8 mg by mouth at bedtime.   Yes Historical Provider, MD  enoxaparin (LOVENOX) 150 MG/ML injection Inject 1 mL (150 mg total) into the skin every 12 (twelve) hours. 09/08/13  Yes Deatra Canter, FNP  finasteride (PROSCAR) 5 MG tablet Take 5 mg by mouth at bedtime.    Yes Historical Provider, MD  latanoprost (XALATAN) 0.005 % ophthalmic solution Place 1 drop into both eyes at bedtime.  01/25/12  Yes Historical Provider, MD  meloxicam (MOBIC) 15 MG tablet Take 1 tablet (15 mg total) by mouth daily. 08/28/13  Yes Ileana Ladd, MD  torsemide (DEMADEX) 10 MG tablet Take 1 tablet (10 mg total) by mouth daily. 08/15/13  Yes Ileana Ladd, MD  warfarin (COUMADIN) 7.5 MG tablet Take 1 tablet (7.5 mg total) by mouth daily at 6 PM. 09/08/13  Yes Deatra Canter, FNP     ROS: As above in the HPI. All other systems are stable or negative.  OBJECTIVE: APPEARANCE:  Patient in no acute distress.The patient appeared  well nourished and normally developed. Acyanotic. Waist: VITAL SIGNS:BP 133/60  Pulse 56  Temp(Src) 98 F (36.7 C) (Oral)  Ht 6\' 7"  (2.007 m) Obese WM  SKIN: warm and  Dry without overt rashes, tattoos and scars  HEAD and Neck: without JVD, Head and scalp: normal Eyes:No scleral icterus. Fundi normal, eye movements normal. Ears: Auricle normal, canal normal, Tympanic membranes normal, insufflation normal. Nose: normal Throat: normal Neck & thyroid: normal  CHEST & LUNGS: Chest wall: normal Lungs: Clear  CVS: Reveals the PMI to be normally located. Regular rhythm, First and Second Heart sounds are normal,  absence of murmurs, rubs or gallops. Peripheral vasculature: Radial pulses: normal Dorsal pedis pulses: normal Posterior pulses: normal  ABDOMEN:  Appearance: Obese Benign, no organomegaly, no masses, no Abdominal Aortic enlargement. No Guarding , no rebound. No Bruits. Bowel sounds: normal  RECTAL: N/A GU: N/A  EXTREMETIES: 3+ edema of the left leg  2+ edema of the right  NEUROLOGIC: oriented toplace and person; nonfocal in extremities. Cranial Nerves are normal. CLINICAL DATA: Bilateral lower extremity edema; history of previous  DVT in the left leg  EXAM:  BILATERAL LOWER EXTREMITY VENOUS DOPPLER ULTRASOUND  TECHNIQUE:  Gray-scale sonography with graded compression, as well as color  Doppler and duplex ultrasound were performed to evaluate the lower  extremity deep venous systems from the level of the common femoral  vein and including the common femoral, femoral, profunda femoral,  popliteal and calf veins including the posterior tibial, peroneal  and gastrocnemius veins when visible. The superficial great  saphenous vein was also interrogated. Spectral Doppler was utilized  to evaluate flow at rest and with distal augmentation maneuvers in  the common femoral, femoral and popliteal veins.  COMPARISON: None.  FINDINGS:  RIGHT LOWER EXTREMITY  Common  Femoral Vein: No evidence of thrombus. Normal  compressibility, respiratory phasicity and response to augmentation.  Saphenofemoral Junction: No evidence of thrombus. Normal  compressibility and flow on color Doppler imaging.  Profunda Femoral Vein: No evidence of thrombus. Normal  compressibility and flow on color Doppler imaging.  Femoral Vein: No evidence of thrombus. Normal compressibility,  respiratory phasicity and response to augmentation.  Popliteal Vein: No evidence of thrombus. Normal compressibility,  respiratory phasicity and response to augmentation.  Calf Veins: No evidence of thrombus. Normal compressibility and flow  on color Doppler imaging.  Superficial Great Saphenous Vein: No evidence of thrombus. Normal  compressibility and flow on color Doppler imaging.  Venous Reflux: None.  Other Findings: The peroneal vein and anterior tibial vein were not  visualized on the right.  LEFT LOWER EXTREMITY  Common Femoral Vein: No evidence of thrombus. Normal  compressibility, respiratory phasicity and response to augmentation.  Saphenofemoral Junction: No evidence of thrombus. Normal  compressibility and flow on color Doppler imaging.  Profunda Femoral Vein: No evidence of thrombus. Normal  compressibility and flow on color Doppler imaging.  Femoral Vein: There is nonocclusive thrombus within the superficial  femoral vein.  Popliteal Vein: There is nonocclusive thrombus within the popliteal  vein.  Calf Veins: No evidence of thrombus. Normal compressibility and flow  on color Doppler imaging.  Superficial Great Saphenous Vein: No evidence of thrombus. Normal  compressibility and flow on color Doppler imaging.  Venous Reflux: None.  Other Findings: The peroneal and anterior tibial veins were not  demonstrated on the left.  IMPRESSION:  1. There is no evidence of deep venous thrombosis in the visualized  veins of the right lower extremity.  2. On the left there is likely old  thrombus which is nonocclusive in  the superficial femoral and popliteal veins.  3. The large amount of edema as well as erythema and ulceration of  the legs limits the study somewhat.  Electronically Signed  By: David SwazilandJordan  On: 09/08/2013 15:47  ASSESSMENT: DVT (deep venous thrombosis) - Plan: POCT INR  Obesity, unspecified  Peripheral vascular disease, unspecified  Leg DVT (deep venous thromboembolism), acute  Edema-Bilat Leg and foot  Cognitive impairment  Dementia  Chronic venous insufficiency  AAA (abdominal aortic aneurysm) without rupture  Pedal edema  COPD (chronic obstructive pulmonary disease)  Anticoagulation goal of INR 2 to 3 I an aunsure if this is a relapse vs residual from the last DVT. Also, unsure if he needs to have a greenfield filter  PLAN:  Orders Placed This Encounter  Procedures  . POCT INR   No orders of the defined types were placed in this encounter.   There are no discontinued medications. No Follow-up on file.  Shavelle Runkel P. Modesto CharonWong, M.D.

## 2013-09-12 ENCOUNTER — Other Ambulatory Visit (INDEPENDENT_AMBULATORY_CARE_PROVIDER_SITE_OTHER): Payer: Medicare Other

## 2013-09-12 DIAGNOSIS — I82409 Acute embolism and thrombosis of unspecified deep veins of unspecified lower extremity: Secondary | ICD-10-CM

## 2013-09-12 DIAGNOSIS — I82402 Acute embolism and thrombosis of unspecified deep veins of left lower extremity: Secondary | ICD-10-CM

## 2013-09-12 LAB — POCT INR: INR: 1.3

## 2013-09-12 NOTE — Telephone Encounter (Signed)
Instructed patient and his wife to have patient take 1 1/2 tablets of warfarin today and tomorrow (7.5mg  tablets) and return Thursday morning for INR so hopefullly it will be at 2.0 or higher so he can stop lovenox injections.

## 2013-09-14 ENCOUNTER — Ambulatory Visit (INDEPENDENT_AMBULATORY_CARE_PROVIDER_SITE_OTHER): Payer: Medicare Other | Admitting: Family Medicine

## 2013-09-14 ENCOUNTER — Telehealth: Payer: Self-pay | Admitting: Family Medicine

## 2013-09-14 DIAGNOSIS — I82409 Acute embolism and thrombosis of unspecified deep veins of unspecified lower extremity: Secondary | ICD-10-CM

## 2013-09-14 LAB — POCT INR: INR: 2

## 2013-09-14 NOTE — Telephone Encounter (Signed)
Discussed with [name].doubt whether patient has a new clot or a remnant of his old DVT. However with his acute leg swelling will anticoagulate until seen by vascular surgeons. Especially in view of his AAA. Patient to take a coumadin 7.5 mg tonight and continue with the lovenox until we see if he stays in the 2 to 3 range on his INR. Family to consider future care in assisted living or placement because his aging changes has made it a challenge to care from home. [name] says the family is resistant and not ready for that. Follow up tomorrow. [name], M.D.

## 2013-09-15 ENCOUNTER — Encounter: Payer: Self-pay | Admitting: Family Medicine

## 2013-09-15 ENCOUNTER — Ambulatory Visit (INDEPENDENT_AMBULATORY_CARE_PROVIDER_SITE_OTHER): Payer: Medicare Other | Admitting: Family Medicine

## 2013-09-15 VITALS — BP 118/75 | HR 61 | Temp 98.1°F

## 2013-09-15 DIAGNOSIS — R4189 Other symptoms and signs involving cognitive functions and awareness: Secondary | ICD-10-CM

## 2013-09-15 DIAGNOSIS — F09 Unspecified mental disorder due to known physiological condition: Secondary | ICD-10-CM

## 2013-09-15 DIAGNOSIS — I739 Peripheral vascular disease, unspecified: Secondary | ICD-10-CM

## 2013-09-15 DIAGNOSIS — I82409 Acute embolism and thrombosis of unspecified deep veins of unspecified lower extremity: Secondary | ICD-10-CM

## 2013-09-15 DIAGNOSIS — Z5181 Encounter for therapeutic drug level monitoring: Secondary | ICD-10-CM

## 2013-09-15 DIAGNOSIS — R609 Edema, unspecified: Secondary | ICD-10-CM

## 2013-09-15 DIAGNOSIS — I714 Abdominal aortic aneurysm, without rupture, unspecified: Secondary | ICD-10-CM

## 2013-09-15 DIAGNOSIS — J449 Chronic obstructive pulmonary disease, unspecified: Secondary | ICD-10-CM

## 2013-09-15 DIAGNOSIS — Z7901 Long term (current) use of anticoagulants: Secondary | ICD-10-CM

## 2013-09-15 DIAGNOSIS — F0390 Unspecified dementia without behavioral disturbance: Secondary | ICD-10-CM

## 2013-09-15 DIAGNOSIS — I872 Venous insufficiency (chronic) (peripheral): Secondary | ICD-10-CM

## 2013-09-15 DIAGNOSIS — I82402 Acute embolism and thrombosis of unspecified deep veins of left lower extremity: Secondary | ICD-10-CM

## 2013-09-15 DIAGNOSIS — E669 Obesity, unspecified: Secondary | ICD-10-CM

## 2013-09-15 DIAGNOSIS — R600 Localized edema: Secondary | ICD-10-CM

## 2013-09-15 LAB — POCT INR: INR: 2.6

## 2013-09-15 NOTE — Patient Instructions (Signed)
Cut the cardura/doxyzosin in 1/2 to 4 mg at nights. Stop the lovenox shots.  Warfarin half a tablet (1/2) tablet on Fridays and Mondays. And one tablet all the other days.  Return Monday for Protime.  exercises while seated.

## 2013-09-15 NOTE — Progress Notes (Signed)
Patient ID: [name], male   DOB: [birth year], 80 y.o.   MRN: [id number] SUBJECTIVE: CC: Chief Complaint  Patient presents with  . Follow-up    reck protime  daughter states he had cramps in right leg and he fell in bathroom and wife tried to help break the fall and he fell in bathroom fall and"I laid there in floor for more than 30min c ause  no one could get me up.  states he hit head on bathrooom door  and per daughter bruises on rt arm      HPI: Here for follow up on his anticoagulation. Patient has been less physicallyactive and needs to get mobile with his walker. There was a question whether he has a recurrence of DVT in the left leg or  Residual. consultation with Dr Arbie CookeyEarly Vascular surgeon. Also he tripped and fell and bumped his head against the door to the BR . No headaches no neurologic  Changes.  Family not ready to consider Placement.   Past Medical History  Diagnosis Date  . Venous insufficiency   . AAA (abdominal aortic aneurysm)   . Ulcer   . DVT (deep venous thrombosis)   . Cellulitis   . Glaucoma   . Nicotine dependence   . Bronchospasm   . Elevated PSA   . BPH (benign prostatic hyperplasia)   . COPD (chronic obstructive pulmonary disease)   . Cognitive impairment    Past Surgical History  Procedure Laterality Date  . Back surgery    . Insitu percutaneous pinning femur Right 1986  . Cholecystectomy    . Hip surgery Right 2009  . Back surgery  2004  . Spine surgery     History   Social History  . Marital Status: Married    Spouse Name: N/A    Number of Children: N/A  . Years of Education: N/A   Occupational History  . Not on file.   Social History Main Topics  . Smoking status: Former Smoker    Types: Cigarettes    Quit date: 08/24/2002  . Smokeless tobacco: Current User    Types: Snuff     Comment: dips snuff  . Alcohol Use: No  . Drug Use: No  . Sexual Activity: Not on file   Other Topics Concern  . Not on file   Social History  Narrative  . No narrative on file   Family History  Problem Relation Age of Onset  . Aneurysm Brother   . Heart attack Brother     10-29-12  . Cancer Father     colon   Current Outpatient Prescriptions on File Prior to Visit  Medication Sig Dispense Refill  . enoxaparin (LOVENOX) 150 MG/ML injection Inject 1 mL (150 mg total) into the skin every 12 (twelve) hours.  8 Syringe  1  . finasteride (PROSCAR) 5 MG tablet Take 5 mg by mouth at bedtime.       [name]. latanoprost (XALATAN) 0.005 % ophthalmic solution Place 1 drop into both eyes at bedtime.       . meloxicam (MOBIC) 15 MG tablet Take 1 tablet (15 mg total) by mouth daily.  30 tablet  1  . torsemide (DEMADEX) 10 MG tablet Take 1 tablet (10 mg total) by mouth daily.  90 tablet  1  . warfarin (COUMADIN) 7.5 MG tablet Take 1 tablet (7.5 mg total) by mouth daily at 6 PM.  30 tablet  1   No current facility-administered medications on file  prior to visit.   Allergies  Allergen Reactions  . Oxycodone Hcl     REACTION: makes pt "wild"  . Propoxyphene N-Acetaminophen     REACTION: Makes pt. "wild"   Immunization History  Administered Date(s) Administered  . Influenza,inj,Quad PF,36+ Mos 03/20/2013  . Pneumococcal Polysaccharide-23 03/02/2007  . Td 12/30/1996   Prior to Admission medications   Medication Sig Start Date End Date Taking? Authorizing Provider  doxazosin (CARDURA) 8 MG tablet Take 8 mg by mouth at bedtime.   Yes Historical Provider, MD  enoxaparin (LOVENOX) 150 MG/ML injection Inject 1 mL (150 mg total) into the skin every 12 (twelve) hours. 09/11/13  Yes Ileana Ladd, MD  finasteride (PROSCAR) 5 MG tablet Take 5 mg by mouth at bedtime.    Yes Historical Provider, MD  latanoprost (XALATAN) 0.005 % ophthalmic solution Place 1 drop into both eyes at bedtime.  01/25/12  Yes Historical Provider, MD  meloxicam (MOBIC) 15 MG tablet Take 1 tablet (15 mg total) by mouth daily. 08/28/13  Yes Ileana Ladd, MD  torsemide (DEMADEX)  10 MG tablet Take 1 tablet (10 mg total) by mouth daily. 08/15/13  Yes Ileana Ladd, MD  warfarin (COUMADIN) 7.5 MG tablet Take 1 tablet (7.5 mg total) by mouth daily at 6 PM. 09/08/13  Yes Deatra Canter, FNP     ROS: As above in the HPI. All other systems are stable or negative.  OBJECTIVE: APPEARANCE:  Patient in no acute distress.The patient appeared well nourished and normally developed. Acyanotic. Waist: VITAL SIGNS:BP 118/75  Pulse 61  Temp(Src) 98.1 F (36.7 C) (Oral)   SKIN: warm and  Dry without overt rashes, tattoos and scars  HEAD and Neck: without JVD, Head and scalp: normal Eyes:No scleral icterus. Fundi normal, eye movements normal. Ears: Auricle normal, canal normal, Tympanic membranes normal, insufflation normal. Nose: normal Throat: normal Neck & thyroid: normal  CHEST & LUNGS: Chest wall: normal Lungs: Clear  CVS: Reveals the PMI to be normally located. Regular rhythm, First and Second Heart sounds are normal,  absence of murmurs, rubs or gallops. Peripheral vasculature: Radial pulses: normal Dorsal pedis pulses: normal Posterior pulses: normal  ABDOMEN:  Appearance: normal Benign, no organomegaly, no masses, no Abdominal Aortic enlargement. No Guarding , no rebound. No Bruits. Bowel sounds: normal  RECTAL: N/A GU: N/A  EXTREMETIES: nonedematous.  MUSCULOSKELETAL:  Spine: normal Joints: intact  NEUROLOGIC: oriented to time,place and person; nonfocal. Strength is normal Sensory is normal Reflexes are normal Cranial Nerves are normal.  ASSESSMENT:  Left leg DVT - Plan: POCT INR  Peripheral vascular disease, unspecified  Obesity, unspecified  Leg DVT (deep venous thromboembolism), acute  Dementia  Cognitive impairment  Chronic venous insufficiency  Anticoagulation goal of INR 2 to 3  AAA (abdominal aortic aneurysm) without rupture  Pedal edema  COPD (chronic obstructive pulmonary disease)  PLAN:  Cut the  cardura/doxyzosin in 1/2 to 4 mg at nights. Stop the lovenox shots.  Warfarin half a tablet (1/2) tablet on Fridays and Mondays. And one tablet all the other days.  Return Monday for Protime.  exercises while seated.   No orders of the defined types were placed in this encounter.   Meds ordered this encounter  Medications  . doxazosin (CARDURA) 8 MG tablet    Sig: Take 0.5 tablets (4 mg total) by mouth at bedtime.   Medications Discontinued During This Encounter  Medication Reason  . doxazosin (CARDURA) 8 MG tablet Reorder  May 12 th  is his appointment with Dr Arbie CookeyEarly for consideration if this is a recurrence or residual and if patient needs to have a filter or not especially in view of added risks of AAA.  Return in about 3 days (around 09/18/2013) for recheck protime.  Idelia Caudell P. Modesto CharonWong, M.D.

## 2013-09-18 ENCOUNTER — Inpatient Hospital Stay (HOSPITAL_COMMUNITY)
Admission: EM | Admit: 2013-09-18 | Discharge: 2013-09-25 | DRG: 056 | Disposition: A | Discharge status: Skilled Nursing Facility | Payer: Medicare Other | Attending: Internal Medicine | Admitting: Internal Medicine

## 2013-09-18 ENCOUNTER — Observation Stay (HOSPITAL_COMMUNITY): Payer: Medicare Other

## 2013-09-18 ENCOUNTER — Emergency Department (HOSPITAL_COMMUNITY): Payer: Medicare Other

## 2013-09-18 ENCOUNTER — Encounter (HOSPITAL_COMMUNITY): Payer: Self-pay | Admitting: Emergency Medicine

## 2013-09-18 ENCOUNTER — Ambulatory Visit (INDEPENDENT_AMBULATORY_CARE_PROVIDER_SITE_OTHER): Payer: Medicare Other | Admitting: Pharmacist

## 2013-09-18 ENCOUNTER — Other Ambulatory Visit: Payer: Medicare Other

## 2013-09-18 DIAGNOSIS — Z9181 History of falling: Secondary | ICD-10-CM

## 2013-09-18 DIAGNOSIS — I4891 Unspecified atrial fibrillation: Secondary | ICD-10-CM

## 2013-09-18 DIAGNOSIS — Z8249 Family history of ischemic heart disease and other diseases of the circulatory system: Secondary | ICD-10-CM

## 2013-09-18 DIAGNOSIS — J9819 Other pulmonary collapse: Secondary | ICD-10-CM | POA: Diagnosis present

## 2013-09-18 DIAGNOSIS — R600 Localized edema: Secondary | ICD-10-CM

## 2013-09-18 DIAGNOSIS — R29898 Other symptoms and signs involving the musculoskeletal system: Secondary | ICD-10-CM | POA: Diagnosis present

## 2013-09-18 DIAGNOSIS — Z5181 Encounter for therapeutic drug level monitoring: Secondary | ICD-10-CM

## 2013-09-18 DIAGNOSIS — I480 Paroxysmal atrial fibrillation: Secondary | ICD-10-CM | POA: Diagnosis present

## 2013-09-18 DIAGNOSIS — Z79899 Other long term (current) drug therapy: Secondary | ICD-10-CM

## 2013-09-18 DIAGNOSIS — E8779 Other fluid overload: Secondary | ICD-10-CM | POA: Diagnosis present

## 2013-09-18 DIAGNOSIS — I714 Abdominal aortic aneurysm, without rupture, unspecified: Secondary | ICD-10-CM

## 2013-09-18 DIAGNOSIS — Y92009 Unspecified place in unspecified non-institutional (private) residence as the place of occurrence of the external cause: Secondary | ICD-10-CM

## 2013-09-18 DIAGNOSIS — R627 Adult failure to thrive: Secondary | ICD-10-CM | POA: Diagnosis present

## 2013-09-18 DIAGNOSIS — G912 (Idiopathic) normal pressure hydrocephalus: Principal | ICD-10-CM | POA: Diagnosis present

## 2013-09-18 DIAGNOSIS — L02818 Cutaneous abscess of other sites: Secondary | ICD-10-CM | POA: Diagnosis present

## 2013-09-18 DIAGNOSIS — F0280 Dementia in other diseases classified elsewhere without behavioral disturbance: Secondary | ICD-10-CM | POA: Diagnosis present

## 2013-09-18 DIAGNOSIS — R531 Weakness: Secondary | ICD-10-CM

## 2013-09-18 DIAGNOSIS — I82409 Acute embolism and thrombosis of unspecified deep veins of unspecified lower extremity: Secondary | ICD-10-CM

## 2013-09-18 DIAGNOSIS — Z86718 Personal history of other venous thrombosis and embolism: Secondary | ICD-10-CM | POA: Insufficient documentation

## 2013-09-18 DIAGNOSIS — J449 Chronic obstructive pulmonary disease, unspecified: Secondary | ICD-10-CM

## 2013-09-18 DIAGNOSIS — I517 Cardiomegaly: Secondary | ICD-10-CM | POA: Diagnosis present

## 2013-09-18 DIAGNOSIS — R001 Bradycardia, unspecified: Secondary | ICD-10-CM

## 2013-09-18 DIAGNOSIS — I82509 Chronic embolism and thrombosis of unspecified deep veins of unspecified lower extremity: Secondary | ICD-10-CM | POA: Diagnosis present

## 2013-09-18 DIAGNOSIS — L02419 Cutaneous abscess of limb, unspecified: Secondary | ICD-10-CM | POA: Diagnosis present

## 2013-09-18 DIAGNOSIS — M25461 Effusion, right knee: Secondary | ICD-10-CM

## 2013-09-18 DIAGNOSIS — E538 Deficiency of other specified B group vitamins: Secondary | ICD-10-CM

## 2013-09-18 DIAGNOSIS — J4489 Other specified chronic obstructive pulmonary disease: Secondary | ICD-10-CM | POA: Diagnosis present

## 2013-09-18 DIAGNOSIS — I872 Venous insufficiency (chronic) (peripheral): Secondary | ICD-10-CM

## 2013-09-18 DIAGNOSIS — L03119 Cellulitis of unspecified part of limb: Secondary | ICD-10-CM

## 2013-09-18 DIAGNOSIS — R0603 Acute respiratory distress: Secondary | ICD-10-CM

## 2013-09-18 DIAGNOSIS — E669 Obesity, unspecified: Secondary | ICD-10-CM

## 2013-09-18 DIAGNOSIS — E876 Hypokalemia: Secondary | ICD-10-CM | POA: Diagnosis present

## 2013-09-18 DIAGNOSIS — I82402 Acute embolism and thrombosis of unspecified deep veins of left lower extremity: Secondary | ICD-10-CM

## 2013-09-18 DIAGNOSIS — R972 Elevated prostate specific antigen [PSA]: Secondary | ICD-10-CM | POA: Diagnosis present

## 2013-09-18 DIAGNOSIS — M549 Dorsalgia, unspecified: Secondary | ICD-10-CM | POA: Diagnosis present

## 2013-09-18 DIAGNOSIS — Z7901 Long term (current) use of anticoagulants: Secondary | ICD-10-CM

## 2013-09-18 DIAGNOSIS — I739 Peripheral vascular disease, unspecified: Secondary | ICD-10-CM

## 2013-09-18 DIAGNOSIS — Z87891 Personal history of nicotine dependence: Secondary | ICD-10-CM

## 2013-09-18 DIAGNOSIS — J9600 Acute respiratory failure, unspecified whether with hypoxia or hypercapnia: Secondary | ICD-10-CM

## 2013-09-18 DIAGNOSIS — S8001XA Contusion of right knee, initial encounter: Secondary | ICD-10-CM

## 2013-09-18 DIAGNOSIS — W19XXXA Unspecified fall, initial encounter: Secondary | ICD-10-CM

## 2013-09-18 DIAGNOSIS — E870 Hyperosmolality and hypernatremia: Secondary | ICD-10-CM

## 2013-09-18 DIAGNOSIS — I359 Nonrheumatic aortic valve disorder, unspecified: Secondary | ICD-10-CM | POA: Diagnosis present

## 2013-09-18 DIAGNOSIS — R5383 Other fatigue: Secondary | ICD-10-CM

## 2013-09-18 DIAGNOSIS — M7989 Other specified soft tissue disorders: Secondary | ICD-10-CM

## 2013-09-18 DIAGNOSIS — Z86711 Personal history of pulmonary embolism: Secondary | ICD-10-CM

## 2013-09-18 DIAGNOSIS — M25569 Pain in unspecified knee: Secondary | ICD-10-CM

## 2013-09-18 DIAGNOSIS — F068 Other specified mental disorders due to known physiological condition: Secondary | ICD-10-CM | POA: Diagnosis present

## 2013-09-18 DIAGNOSIS — N3941 Urge incontinence: Secondary | ICD-10-CM | POA: Diagnosis present

## 2013-09-18 DIAGNOSIS — L03818 Cellulitis of other sites: Secondary | ICD-10-CM

## 2013-09-18 DIAGNOSIS — R609 Edema, unspecified: Secondary | ICD-10-CM

## 2013-09-18 DIAGNOSIS — R5381 Other malaise: Secondary | ICD-10-CM | POA: Diagnosis present

## 2013-09-18 DIAGNOSIS — I498 Other specified cardiac arrhythmias: Secondary | ICD-10-CM | POA: Diagnosis not present

## 2013-09-18 DIAGNOSIS — N400 Enlarged prostate without lower urinary tract symptoms: Secondary | ICD-10-CM | POA: Diagnosis present

## 2013-09-18 DIAGNOSIS — R269 Unspecified abnormalities of gait and mobility: Secondary | ICD-10-CM | POA: Diagnosis present

## 2013-09-18 DIAGNOSIS — F0390 Unspecified dementia without behavioral disturbance: Secondary | ICD-10-CM

## 2013-09-18 DIAGNOSIS — H409 Unspecified glaucoma: Secondary | ICD-10-CM | POA: Diagnosis present

## 2013-09-18 DIAGNOSIS — R4189 Other symptoms and signs involving cognitive functions and awareness: Secondary | ICD-10-CM

## 2013-09-18 DIAGNOSIS — M79609 Pain in unspecified limb: Secondary | ICD-10-CM

## 2013-09-18 DIAGNOSIS — M25469 Effusion, unspecified knee: Secondary | ICD-10-CM | POA: Diagnosis present

## 2013-09-18 HISTORY — DX: Unspecified osteoarthritis, unspecified site: M19.90

## 2013-09-18 HISTORY — DX: Unspecified place in unspecified non-institutional (private) residence as the place of occurrence of the external cause: Y92.009

## 2013-09-18 HISTORY — DX: Unspecified glaucoma: H40.9

## 2013-09-18 HISTORY — DX: Weakness: R53.1

## 2013-09-18 HISTORY — DX: Headache, unspecified: R51.9

## 2013-09-18 HISTORY — DX: Headache: R51

## 2013-09-18 HISTORY — DX: Unspecified dementia, unspecified severity, without behavioral disturbance, psychotic disturbance, mood disturbance, and anxiety: F03.90

## 2013-09-18 HISTORY — DX: Calculus of kidney: N20.0

## 2013-09-18 HISTORY — DX: Unspecified place in unspecified non-institutional (private) residence as the place of occurrence of the external cause: W19.XXXA

## 2013-09-18 LAB — COMPREHENSIVE METABOLIC PANEL
ALT: 45 U/L (ref 0–53)
AST: 40 U/L — ABNORMAL HIGH (ref 0–37)
Albumin: 3.5 g/dL (ref 3.5–5.2)
Alkaline Phosphatase: 59 U/L (ref 39–117)
BUN: 18 mg/dL (ref 6–23)
CALCIUM: 9.5 mg/dL (ref 8.4–10.5)
CO2: 28 mEq/L (ref 19–32)
Chloride: 110 mEq/L (ref 96–112)
Creatinine, Ser: 1 mg/dL (ref 0.50–1.35)
GFR calc Af Amer: 80 mL/min — ABNORMAL LOW (ref >90)
GFR calc non Af Amer: 69 mL/min — ABNORMAL LOW (ref >90)
Glucose, Bld: 129 mg/dL — ABNORMAL HIGH (ref 70–99)
Potassium: 4.2 mEq/L (ref 3.7–5.3)
Sodium: 150 mEq/L — ABNORMAL HIGH (ref 137–147)
TOTAL PROTEIN: 6.5 g/dL (ref 6.0–8.3)
Total Bilirubin: 0.5 mg/dL (ref 0.3–1.2)

## 2013-09-18 LAB — CBC WITH DIFFERENTIAL/PLATELET
BASOS ABS: 0 10*3/uL (ref 0.0–0.1)
Basophils Relative: 0 % (ref 0–1)
EOS ABS: 0.3 10*3/uL (ref 0.0–0.7)
Eosinophils Relative: 4 % (ref 0–5)
HEMATOCRIT: 40.8 % (ref 39.0–52.0)
HEMOGLOBIN: 13.2 g/dL (ref 13.0–17.0)
Lymphocytes Relative: 12 % (ref 12–46)
Lymphs Abs: 0.9 10*3/uL (ref 0.7–4.0)
MCH: 30 pg (ref 26.0–34.0)
MCHC: 32.4 g/dL (ref 30.0–36.0)
MCV: 92.7 fL (ref 78.0–100.0)
MONO ABS: 0.8 10*3/uL (ref 0.1–1.0)
MONOS PCT: 11 % (ref 3–12)
Neutro Abs: 5.8 10*3/uL (ref 1.7–7.7)
Neutrophils Relative %: 73 % (ref 43–77)
Platelets: 200 10*3/uL (ref 150–400)
RBC: 4.4 MIL/uL (ref 4.22–5.81)
RDW: 16.4 % — ABNORMAL HIGH (ref 11.5–15.5)
WBC: 8 10*3/uL (ref 4.0–10.5)

## 2013-09-18 LAB — URINALYSIS, ROUTINE W REFLEX MICROSCOPIC
Bilirubin Urine: NEGATIVE
Glucose, UA: NEGATIVE mg/dL
HGB URINE DIPSTICK: NEGATIVE
Ketones, ur: NEGATIVE mg/dL
Leukocytes, UA: NEGATIVE
Nitrite: NEGATIVE
PH: 5 (ref 5.0–8.0)
Protein, ur: NEGATIVE mg/dL
SPECIFIC GRAVITY, URINE: 1.019 (ref 1.005–1.030)
UROBILINOGEN UA: 1 mg/dL (ref 0.0–1.0)

## 2013-09-18 LAB — I-STAT TROPONIN, ED: TROPONIN I, POC: 0.01 ng/mL (ref 0.00–0.08)

## 2013-09-18 LAB — PRO B NATRIURETIC PEPTIDE: PRO B NATRI PEPTIDE: 245.5 pg/mL (ref 0–450)

## 2013-09-18 LAB — PROTIME-INR
INR: 1.92 — AB (ref 0.00–1.49)
PROTHROMBIN TIME: 21.4 s — AB (ref 11.6–15.2)

## 2013-09-18 LAB — POCT INR: INR: 2.6

## 2013-09-18 LAB — TSH: TSH: 2.44 u[IU]/mL (ref 0.350–4.500)

## 2013-09-18 MED ORDER — DOXAZOSIN MESYLATE 4 MG PO TABS
4.0000 mg | ORAL_TABLET | Freq: Every day | ORAL | Status: DC
  Administered 2013-09-18 – 2013-09-24 (×7): 4 mg via ORAL
  Filled 2013-09-18 (×9): qty 1

## 2013-09-18 MED ORDER — FUROSEMIDE 10 MG/ML IJ SOLN
40.0000 mg | Freq: Two times a day (BID) | INTRAMUSCULAR | Status: DC
Start: 2013-09-18 — End: 2013-09-19
  Administered 2013-09-19: 40 mg via INTRAVENOUS
  Filled 2013-09-18 (×2): qty 4

## 2013-09-18 MED ORDER — FUROSEMIDE 10 MG/ML IJ SOLN
40.0000 mg | Freq: Once | INTRAMUSCULAR | Status: AC
  Administered 2013-09-18: 40 mg via INTRAVENOUS
  Filled 2013-09-18: qty 4

## 2013-09-18 MED ORDER — ACETAMINOPHEN 500 MG PO TABS
1000.0000 mg | ORAL_TABLET | Freq: Once | ORAL | Status: AC
  Administered 2013-09-18: 1000 mg via ORAL
  Filled 2013-09-18: qty 2

## 2013-09-18 MED ORDER — FINASTERIDE 5 MG PO TABS
5.0000 mg | ORAL_TABLET | Freq: Every day | ORAL | Status: DC
  Administered 2013-09-18 – 2013-09-24 (×7): 5 mg via ORAL
  Filled 2013-09-18 (×8): qty 1

## 2013-09-18 MED ORDER — ACETAMINOPHEN 325 MG PO TABS
650.0000 mg | ORAL_TABLET | Freq: Once | ORAL | Status: AC
  Administered 2013-09-18: 650 mg via ORAL
  Filled 2013-09-18: qty 2

## 2013-09-18 MED ORDER — SODIUM CHLORIDE 0.9 % IJ SOLN
3.0000 mL | Freq: Two times a day (BID) | INTRAMUSCULAR | Status: DC
  Administered 2013-09-19 – 2013-09-24 (×8): 3 mL via INTRAVENOUS

## 2013-09-18 NOTE — ED Notes (Signed)
Steristrips and surgicel applied to left forearm by Dr. Silverio LayYao.

## 2013-09-18 NOTE — Progress Notes (Signed)
Spoke with Dr Modesto CharonWong about Mr. [name]'s anticoagulation.  There is concern of with continuing warfarin since he has AAA but more recent ultrasound showed small clot in his legs with appear to be old but did not resolve after 3 months of anticoagulation with warfarin. Patient is not very mobile and is in wheelchair today.  There is also concern of falls due to his wife helping to move him around.   Mr. [name] has appt next month with vascular surgeon.  Plan is to continue warfarin until vascular surgeon makes recommendations.  Future options include newer anticoagulant that does not require monitoring.  Home INR monitoring or Home Health monitoring INR at home.  Will check into home health or Home INR options.

## 2013-09-18 NOTE — ED Notes (Signed)
Attempted report x1. 

## 2013-09-18 NOTE — Progress Notes (Signed)
Patient came in for labs only.

## 2013-09-18 NOTE — Patient Instructions (Signed)
Anticoagulation Dose Instructions as of 09/18/2013     Joel SmilesSun Mon Tue Wed Thu Fri Sat   New Dose 7.5 mg 3.75 mg 7.5 mg 3.75 mg 7.5 mg 3.75 mg 7.5 mg    Description       Start 1/2 tablet Mondays, Wednesdays and Fridays.  1 tablet all other days.      INR was 2.6 today

## 2013-09-18 NOTE — ED Provider Notes (Addendum)
CSN: [id number]     Arrival date & time 09/18/13  1542 History   First MD Initiated Contact with Patient 09/18/13 1557     Chief Complaint  Patient presents with  . Weakness     (Consider location/radiation/quality/duration/timing/severity/associated sxs/prior Treatment) The history is provided by the patient.  OLAND ARQUETTE is a 80 y.o. male hx of DVT on coumadin, COPD, AAA here with weakness. He is chronically weak. Today, he was using the restroom and urinated on the commode. Afterwards, he felt weak and fell to the floor and was unable to get up. He hit his L arm of something and it was bleeding. States that tetanus was up to date. Denies LOC or head injury. Denies chest pain or shortness of breath. Recently had recurrent DVT and is on coumadin and lovenox.     Past Medical History  Diagnosis Date  . Venous insufficiency   . AAA (abdominal aortic aneurysm)   . Ulcer   . DVT (deep venous thrombosis)   . Cellulitis   . Glaucoma   . Nicotine dependence   . Bronchospasm   . Elevated PSA   . BPH (benign prostatic hyperplasia)   . COPD (chronic obstructive pulmonary disease)   . Cognitive impairment    Past Surgical History  Procedure Laterality Date  . Back surgery    . Insitu percutaneous pinning femur Right 1986  . Cholecystectomy    . Hip surgery Right 2009  . Back surgery  2004  . Spine surgery     Family History  Problem Relation Age of Onset  . Aneurysm Brother   . Heart attack Brother     10-29-12  . Cancer Father     colon   History  Substance Use Topics  . Smoking status: Former Smoker    Types: Cigarettes    Quit date: 08/24/2002  . Smokeless tobacco: Current User    Types: Snuff     Comment: dips snuff  . Alcohol Use: No    Review of Systems  Skin: Positive for wound.  Neurological: Positive for weakness.  All other systems reviewed and are negative.     Allergies  Oxycodone hcl and Propoxyphene n-acetaminophen  Home Medications   Prior to  Admission medications   Medication Sig Start Date End Date Taking? Authorizing Provider  doxazosin (CARDURA) 8 MG tablet Take 0.5 tablets (4 mg total) by mouth at bedtime. 09/15/13   Ileana Ladd, MD  enoxaparin (LOVENOX) 150 MG/ML injection Inject 1 mL (150 mg total) into the skin every 12 (twelve) hours. 09/11/13   Ileana Ladd, MD  finasteride (PROSCAR) 5 MG tablet Take 5 mg by mouth at bedtime.     Historical Provider, MD  latanoprost (XALATAN) 0.005 % ophthalmic solution Place 1 drop into both eyes at bedtime.  01/25/12   Historical Provider, MD  meloxicam (MOBIC) 15 MG tablet Take 1 tablet (15 mg total) by mouth daily. 08/28/13   Ileana Ladd, MD  torsemide (DEMADEX) 10 MG tablet Take 1 tablet (10 mg total) by mouth daily. 08/15/13   Ileana Ladd, MD  warfarin (COUMADIN) 7.5 MG tablet Take 1 tablet (7.5 mg total) by mouth daily at 6 PM. 09/08/13   Deatra Canter, FNP   BP 149/58  Pulse 61  Temp(Src) 98.4 F (36.9 C) (Oral)  Resp 18  SpO2 96% Physical Exam  Nursing note and vitals reviewed. Constitutional: He is oriented to person, place, and time.  Chronically ill, NAD  HENT:  Head: Normocephalic.  Mouth/Throat: Oropharynx is clear and moist.  Eyes: Conjunctivae are normal. Pupils are equal, round, and reactive to light.  Neck: Normal range of motion. Neck supple.  Cardiovascular: Normal rate, regular rhythm and normal heart sounds.   Pulmonary/Chest: Effort normal and breath sounds normal. No respiratory distress. He has no wheezes. He has no rales.  Abdominal: Soft. Bowel sounds are normal. He exhibits no distension. There is no tenderness. There is no rebound.  Musculoskeletal: Normal range of motion.  2+ edema bilateral legs (chronic per patient)   Neurological: He is alert and oriented to person, place, and time. No cranial nerve deficit. Coordination normal.  Skin: Skin is warm and dry.  L forearm with linear laceration 10 cm, well approximated with dry blood.    Psychiatric: He has a normal mood and affect. His behavior is normal. Judgment and thought content normal.    ED Course  Procedures (including critical care time)  LACERATION REPAIR Performed by: Richardean Canalavid H Kahle Mcqueen Authorized by: Richardean Canalavid H Royelle Hinchman Consent: Verbal consent obtained. Risks and benefits: risks, benefits and alternatives were discussed Consent given by: patient Patient identity confirmed: provided demographic data Prepped and Draped in normal sterile fashion Wound explored  Laceration Location: L forearm  Laceration Length: 10 cm  No Foreign Bodies seen or palpated  Anesthesia: none  Irrigation method: syringe Amount of cleaning: standard  Skin closure: steri strips and surgicel then ABD and curlix    Patient tolerance: Patient tolerated the procedure well with no immediate complications.   Labs Review Labs Reviewed  CBC WITH DIFFERENTIAL - Abnormal; Notable for the following:    RDW 16.4 (*)    All other components within normal limits  COMPREHENSIVE METABOLIC PANEL - Abnormal; Notable for the following:    Sodium 150 (*)    Glucose, Bld 129 (*)    AST 40 (*)    GFR calc non Af Amer 69 (*)    GFR calc Af Amer 80 (*)    All other components within normal limits  PROTIME-INR - Abnormal; Notable for the following:    Prothrombin Time 21.4 (*)    INR 1.92 (*)    All other components within normal limits  URINE CULTURE  URINALYSIS, ROUTINE W REFLEX MICROSCOPIC  PRO B NATRIURETIC PEPTIDE  I-STAT TROPOININ, ED    Imaging Review Dg Chest 2 View  09/18/2013   CLINICAL DATA:  Weakness  EXAM: CHEST - 2 VIEW  COMPARISON:  DG CHEST 1V PORT dated 05/31/2008  FINDINGS: Borderline cardiomegaly allowing for AP technique. Vascular congestion. Bibasilar atelectasis. No Kerley B-lines. No pneumothorax. No pleural effusion.  IMPRESSION: Bibasilar atelectasis.   Electronically Signed   By: Maryclare BeanArt  Hoss M.D.   On: 09/18/2013 18:06   Dg Tibia/fibula Right  09/18/2013   CLINICAL  DATA:  Leg pain and swelling  EXAM: RIGHT TIBIA AND FIBULA - 2 VIEW  COMPARISON:  None.  FINDINGS: Four views of the right tibia-fibula submitted. No acute fracture or subluxation. Mild soft tissue swelling.  IMPRESSION: No acute fracture or subluxation.  Diffuse soft tissue swelling.   Electronically Signed   By: Natasha MeadLiviu  Pop M.D.   On: 09/18/2013 18:16     EKG Interpretation   Date/Time:  Monday September 18 2013 15:55:10 EDT Ventricular Rate:  72 PR Interval:  201 QRS Duration: 114 QT Interval:  378 QTC Calculation: 414 R Axis:   -30 Text Interpretation:  Sinus rhythm Borderline IVCD with LAD No significant  change since  last tracing Confirmed by Ballard Budney  MD, Lurdes Haltiwanger ([id number]) on 09/18/2013  4:13:32 PM      MDM   Final diagnoses:  None    Waymond CeraBryce A Strange is a 80 y.o. male here with diffuse weakness after urinating. Consider near syncope. EKG unchanged. Will get labs, CXR, UA to r/o infection. Will check orthostatics.   8:51 PM Patient unable to get up. Xray showed no fracture. Weakness likely multi factorial. Has significant leg swelling which is likely to contribute. BNP nl so I doubt CHF. Hypernatremic likely from volume overload. Will admit for observation and diuresis.     Richardean Canalavid H Sire Poet, MD 09/18/13 2102  Richardean Canalavid H Srihan Brutus, MD 09/18/13 2109

## 2013-09-18 NOTE — ED Notes (Signed)
Dr. Newton at bedside. 

## 2013-09-18 NOTE — ED Notes (Signed)
Adam, EMT assisted in ambulation of pt; pt could not bear weight on right foot/ankle with two persons assisting.

## 2013-09-18 NOTE — ED Notes (Signed)
Report called to [place] Village[place], RN 3W.

## 2013-09-18 NOTE — ED Notes (Signed)
Unable to complete orthostatic vital signs due to pt's pain in right leg upon attempting to sit pt on the side of the bed.  EDP made aware.

## 2013-09-18 NOTE — ED Notes (Signed)
Per ems-- family reports that pt normally ambulates with walker. today had sudden onset of weakness 1 hour ago and fell using the bathroom. Pt now c/o R leg pain and is unable to stand- still having generalized weakness. 2 lacerations on L arm. Pt taking coumadin.

## 2013-09-18 NOTE — ED Notes (Signed)
Pt transported to MRI 

## 2013-09-18 NOTE — H&P (Signed)
Hospitalist Admission History and Physical  Patient name: [name] Medical record number: [id number] Date of birth: Oct 30, 1931 Age: 80 y.o. Gender: male  Primary Care Provider: Redmond Baseman, MD  Chief Complaint: weakness, falls   History of Present Illness:This is a 80 y.o. year old male with noted prior history of chronic venous insufficiency, obesity, COPD, AAA, peripheral vascular disease, hx/o DVT presenting with progressive weakness and falls at home. Pt has had a relatively protacted course at home per family. Pt lives at home with his wife who is the same age. Pt was diagnosed with LLE DVT 05/2013. Was on coumadin for 3 months. Family states that pt had progressive pain and swelling in LLE. Had repeat LE doppler 08/2013 that showed old clot burden in L venous system. Pt was restarted on coumadin. Family states that during the course of the first DVT, patient remained relatively normal wall asked to avoid any risk of falling. Family states this is since this point patient has had progressive lower extremity weakness with recurrent falls at home. Patient had one episode of confirmed head trauma that was mild. Today, patient fell at home with the inability to bear weight on the right side. There is no reported numbness or paresthesias. Patient denied any chest pain, shortness of breath, orthopnea prior to symptoms, though patient is a relatively poor historian. In the ER today, patient hemodynamically stable. INR today at 1.92. No significant abnormalities on lab report from a sodium of 150. Pro BNP @ 245. Chest x-ray with bibasilar atelectasis. Right tib-fib with no acute fracture. Family felt unsafe, taking pt home.   Patient Active Problem List   Diagnosis Date Noted  . History of DVT (deep vein thrombosis) 09/18/2013  . Weakness 09/18/2013  . Left leg DVT 08/03/2013  . Anticoagulation goal of INR 2 to 3 06/01/2013  . Leg DVT (deep venous thromboembolism), acute 05/04/2013  . Pain  in limb-Bilateral leg 03/07/2013  . Edema-Bilat Leg and foot 03/07/2013  . Pain in joint, lower leg 01/26/2013  . Pedal edema 10/06/2012  . Chronic venous insufficiency 10/06/2012  . COPD (chronic obstructive pulmonary disease) 10/06/2012  . Obesity, unspecified 10/06/2012  . AAA (abdominal aortic aneurysm) without rupture 10/06/2012  . Cognitive impairment 10/06/2012  . Dementia 10/06/2012  . Peripheral vascular disease, unspecified 09/20/2012  . Abdominal aneurysm without mention of rupture 03/01/2012   Past Medical History: Past Medical History  Diagnosis Date  . Venous insufficiency   . AAA (abdominal aortic aneurysm)   . Ulcer   . DVT (deep venous thrombosis)   . Cellulitis   . Glaucoma   . Nicotine dependence   . Bronchospasm   . Elevated PSA   . BPH (benign prostatic hyperplasia)   . COPD (chronic obstructive pulmonary disease)   . Cognitive impairment     Past Surgical History: Past Surgical History  Procedure Laterality Date  . Back surgery    . Insitu percutaneous pinning femur Right 1986  . Cholecystectomy    . Hip surgery Right 2009  . Back surgery  2004  . Spine surgery      Social History: History   Social History  . Marital Status: Married    Spouse Name: N/A    Number of Children: N/A  . Years of Education: N/A   Social History Main Topics  . Smoking status: Former Smoker    Types: Cigarettes    Quit date: 08/24/2002  . Smokeless tobacco: Current User    Types: Snuff  Comment: dips snuff  . Alcohol Use: No  . Drug Use: No  . Sexual Activity: None   Other Topics Concern  . None   Social History Narrative  . None    Family History: Family History  Problem Relation Age of Onset  . Aneurysm Brother   . Heart attack Brother     10-29-12  . Cancer Father     colon    Allergies: Allergies  Allergen Reactions  . Oxycodone Hcl     REACTION: makes pt "wild"  . Propoxyphene N-Acetaminophen     REACTION: Makes pt. "wild"     Current Facility-Administered Medications  Medication Dose Route Frequency Provider Last Rate Last Dose  . doxazosin (CARDURA) tablet 4 mg  4 mg Oral QHS Doree AlbeeSteven Shyenne Maggard, MD      . finasteride (PROSCAR) tablet 5 mg  5 mg Oral QHS Doree AlbeeSteven Allsion Nogales, MD      . furosemide (LASIX) injection 40 mg  40 mg Intravenous Q12H Doree AlbeeSteven Matthews Franks, MD      . sodium chloride 0.9 % injection 3 mL  3 mL Intravenous Q12H Doree AlbeeSteven Markeya Mincy, MD       Current Outpatient Prescriptions  Medication Sig Dispense Refill  . doxazosin (CARDURA) 8 MG tablet Take 0.5 tablets (4 mg total) by mouth at bedtime.      . finasteride (PROSCAR) 5 MG tablet Take 5 mg by mouth at bedtime.       [name]. latanoprost (XALATAN) 0.005 % ophthalmic solution Place 1 drop into both eyes at bedtime.       . torsemide (DEMADEX) 10 MG tablet Take 1 tablet (10 mg total) by mouth daily.  90 tablet  1  . warfarin (COUMADIN) 7.5 MG tablet Take 3.75-7.5 mg by mouth daily at 6 PM. Take 1/2(3.75) tablet on Tuesday Thursday saturdays and whole tablet rest of days.       Review Of Systems: 12 point ROS negative except as noted above in HPI.  Physical Exam: Filed Vitals:   09/18/13 2100  BP: 133/58  Pulse: 70  Temp:   Resp: 17    General: alert, cooperative and moderately obese HEENT: PERRLA and extra ocular movement intact Heart: S1, S2 normal, no murmur, rub or gallop, regular rate and rhythm Lungs: clear to auscultation, no wheezes or rales and unlabored breathing Abdomen: abdomen is soft without significant tenderness, masses, organomegaly or guarding Extremities: 3+ pitting edema bilaterally, + venous stasis changes bilaterally  Skin:venous stasis changes in LEs bilaterally  Neurology: normal without focal findings  Labs and Imaging: Lab Results  Component Value Date/Time   NA 150* 09/18/2013  4:02 PM   NA 147* 08/15/2013  1:57 PM   K 4.2 09/18/2013  4:02 PM   CL 110 09/18/2013  4:02 PM   CO2 28 09/18/2013  4:02 PM   BUN 18 09/18/2013  4:02 PM    BUN 16 08/15/2013  1:57 PM   CREATININE 1.00 09/18/2013  4:02 PM   CREATININE 1.26 10/06/2012  2:58 PM   GLUCOSE 129* 09/18/2013  4:02 PM   GLUCOSE 104* 08/15/2013  1:57 PM   Lab Results  Component Value Date   WBC 8.0 09/18/2013   HGB 13.2 09/18/2013   HCT 40.8 09/18/2013   MCV 92.7 09/18/2013   PLT 200 09/18/2013    Dg Chest 2 View  09/18/2013   CLINICAL DATA:  Weakness  EXAM: CHEST - 2 VIEW  COMPARISON:  DG CHEST 1V PORT dated 05/31/2008  FINDINGS: Borderline cardiomegaly allowing  for AP technique. Vascular congestion. Bibasilar atelectasis. No Kerley B-lines. No pneumothorax. No pleural effusion.  IMPRESSION: Bibasilar atelectasis.   Electronically Signed   By: Maryclare BeanArt  Hoss M.D.   On: 09/18/2013 18:06   Dg Tibia/fibula Right  09/18/2013   CLINICAL DATA:  Leg pain and swelling  EXAM: RIGHT TIBIA AND FIBULA - 2 VIEW  COMPARISON:  None.  FINDINGS: Four views of the right tibia-fibula submitted. No acute fracture or subluxation. Mild soft tissue swelling.  IMPRESSION: No acute fracture or subluxation.  Diffuse soft tissue swelling.   Electronically Signed   By: Natasha MeadLiviu  Pop M.D.   On: 09/18/2013 18:16     Assessment and Plan: [name] is a 80 y.o. year old male presenting with weakness  Weakness: Likely multifactorial contributions of deconditioning, peripheral vascular disease, morbid obesity. Check MRI brain to rule out CVA. No abd pain concerning for ruptured AAA. Consider checking abd u/s vs. CT. Check Vitamin D, TSH. PT/OT consult.   Hypernatremia: Check urine osm to coorelate. Significant amount of LE edema. Will diuresis with IV lasix. Strict Ins and Outs and daily weights. Reassess in am.   COPD: stable from a resp standpoint. No resp distress. Satting well on RA. Continue to follow.   FEN/GI: heart healthy diet.  Prophylaxis: coumadin Disposition: pending further evaluation Code Status:Full Code.        Doree AlbeeSteven Amon Costilla MD  Pager: [phone]

## 2013-09-18 NOTE — ED Notes (Signed)
Dr. Silverio LayYao at bedside cleaning would to left forearm.

## 2013-09-18 NOTE — ED Notes (Signed)
MRI to be done tonight per MRI tech.  3W notified of delay in pt going upstairs.

## 2013-09-19 ENCOUNTER — Observation Stay (HOSPITAL_COMMUNITY): Payer: Medicare Other

## 2013-09-19 ENCOUNTER — Encounter (HOSPITAL_COMMUNITY): Payer: Self-pay | Admitting: Neurology

## 2013-09-19 DIAGNOSIS — Z5181 Encounter for therapeutic drug level monitoring: Secondary | ICD-10-CM

## 2013-09-19 DIAGNOSIS — I872 Venous insufficiency (chronic) (peripheral): Secondary | ICD-10-CM

## 2013-09-19 DIAGNOSIS — R609 Edema, unspecified: Secondary | ICD-10-CM

## 2013-09-19 DIAGNOSIS — Z7901 Long term (current) use of anticoagulants: Secondary | ICD-10-CM

## 2013-09-19 DIAGNOSIS — E870 Hyperosmolality and hypernatremia: Secondary | ICD-10-CM

## 2013-09-19 LAB — CBC WITH DIFFERENTIAL/PLATELET
BASOS ABS: 0 10*3/uL (ref 0.0–0.1)
Basophils Relative: 0 % (ref 0–1)
EOS ABS: 0.4 10*3/uL (ref 0.0–0.7)
EOS PCT: 6 % — AB (ref 0–5)
HCT: 40.1 % (ref 39.0–52.0)
Hemoglobin: 12.7 g/dL — ABNORMAL LOW (ref 13.0–17.0)
LYMPHS ABS: 1.4 10*3/uL (ref 0.7–4.0)
Lymphocytes Relative: 17 % (ref 12–46)
MCH: 29.5 pg (ref 26.0–34.0)
MCHC: 31.7 g/dL (ref 30.0–36.0)
MCV: 93.3 fL (ref 78.0–100.0)
Monocytes Absolute: 1.5 10*3/uL — ABNORMAL HIGH (ref 0.1–1.0)
Monocytes Relative: 19 % — ABNORMAL HIGH (ref 3–12)
Neutro Abs: 4.7 10*3/uL (ref 1.7–7.7)
Neutrophils Relative %: 58 % (ref 43–77)
PLATELETS: 177 10*3/uL (ref 150–400)
RBC: 4.3 MIL/uL (ref 4.22–5.81)
RDW: 16.7 % — AB (ref 11.5–15.5)
WBC: 8 10*3/uL (ref 4.0–10.5)

## 2013-09-19 LAB — COMPREHENSIVE METABOLIC PANEL
ALT: 37 U/L (ref 0–53)
AST: 28 U/L (ref 0–37)
Albumin: 3.4 g/dL — ABNORMAL LOW (ref 3.5–5.2)
Alkaline Phosphatase: 54 U/L (ref 39–117)
BILIRUBIN TOTAL: 0.8 mg/dL (ref 0.3–1.2)
BUN: 19 mg/dL (ref 6–23)
CHLORIDE: 109 meq/L (ref 96–112)
CO2: 27 mEq/L (ref 19–32)
Calcium: 9 mg/dL (ref 8.4–10.5)
Creatinine, Ser: 1.01 mg/dL (ref 0.50–1.35)
GFR calc Af Amer: 79 mL/min — ABNORMAL LOW (ref >90)
GFR calc non Af Amer: 68 mL/min — ABNORMAL LOW (ref >90)
Glucose, Bld: 112 mg/dL — ABNORMAL HIGH (ref 70–99)
Potassium: 3.6 mEq/L — ABNORMAL LOW (ref 3.7–5.3)
SODIUM: 149 meq/L — AB (ref 137–147)
Total Protein: 6.4 g/dL (ref 6.0–8.3)

## 2013-09-19 LAB — VITAMIN B12: Vitamin B-12: 192 pg/mL — ABNORMAL LOW (ref 211–911)

## 2013-09-19 LAB — VITAMIN D 25 HYDROXY (VIT D DEFICIENCY, FRACTURES): VIT D 25 HYDROXY: 41 ng/mL (ref 30–89)

## 2013-09-19 LAB — HEMOGLOBIN A1C
Hgb A1c MFr Bld: 5.8 % — ABNORMAL HIGH (ref <5.7)
Mean Plasma Glucose: 120 mg/dL — ABNORMAL HIGH (ref <117)

## 2013-09-19 LAB — TSH: TSH: 1.29 u[IU]/mL (ref 0.350–4.500)

## 2013-09-19 LAB — URINE CULTURE
Colony Count: NO GROWTH
Culture: NO GROWTH

## 2013-09-19 LAB — RPR

## 2013-09-19 LAB — PROTIME-INR
INR: 2.02 — AB (ref 0.00–1.49)
Prothrombin Time: 22.2 seconds — ABNORMAL HIGH (ref 11.6–15.2)

## 2013-09-19 LAB — OSMOLALITY, URINE: Osmolality, Ur: 519 mOsm/kg (ref 390–1090)

## 2013-09-19 MED ORDER — FUROSEMIDE 40 MG PO TABS
40.0000 mg | ORAL_TABLET | Freq: Every day | ORAL | Status: DC
  Administered 2013-09-19 – 2013-09-21 (×3): 40 mg via ORAL
  Filled 2013-09-19 (×4): qty 1

## 2013-09-19 MED ORDER — WARFARIN SODIUM 7.5 MG PO TABS
7.5000 mg | ORAL_TABLET | Freq: Once | ORAL | Status: AC
  Administered 2013-09-19: 7.5 mg via ORAL
  Filled 2013-09-19: qty 1

## 2013-09-19 MED ORDER — POTASSIUM CHLORIDE CRYS ER 20 MEQ PO TBCR
40.0000 meq | EXTENDED_RELEASE_TABLET | Freq: Once | ORAL | Status: AC
  Administered 2013-09-19: 40 meq via ORAL
  Filled 2013-09-19: qty 2

## 2013-09-19 MED ORDER — WARFARIN - PHARMACIST DOSING INPATIENT
Freq: Every day | Status: DC
  Administered 2013-09-23 – 2013-09-24 (×2)

## 2013-09-19 MED ORDER — VANCOMYCIN HCL 10 G IV SOLR
1500.0000 mg | Freq: Two times a day (BID) | INTRAVENOUS | Status: DC
  Administered 2013-09-19 – 2013-09-21 (×4): 1500 mg via INTRAVENOUS
  Filled 2013-09-19 (×5): qty 1500

## 2013-09-19 MED ORDER — VANCOMYCIN HCL 10 G IV SOLR
2500.0000 mg | INTRAVENOUS | Status: AC
  Administered 2013-09-19: 2500 mg via INTRAVENOUS
  Filled 2013-09-19: qty 2500

## 2013-09-19 NOTE — Progress Notes (Signed)
UR completed 

## 2013-09-19 NOTE — Progress Notes (Signed)
ANTICOAGULATION CONSULT NOTE - Initial Consult  Pharmacy Consult for Coumadin Indication: hx DVT   Allergies  Allergen Reactions  . Oxycodone Hcl     REACTION: makes pt "wild"  . Propoxyphene N-Acetaminophen     REACTION: Makes pt. "wild"    Patient Measurements: Height: 6\' 8"  (203.2 cm) Weight: 323 lb 6.4 oz (146.693 kg) IBW/kg (Calculated) : 96  Vital Signs: Temp: 98.3 F (36.8 C) (04/21 0512) Temp src: Oral (04/21 0512) BP: 131/50 mmHg (04/21 1001) Pulse Rate: 61 (04/21 0512)  Labs:  Recent Labs  09/18/13 1032 09/18/13 1602 09/19/13 0531 09/19/13 1005  HGB  --  13.2 12.7*  --   HCT  --  40.8 40.1  --   PLT  --  200 177  --   LABPROT  --  21.4*  --  22.2*  INR 2.6 1.92*  --  2.02*  CREATININE  --  1.00 1.01  --     Estimated Creatinine Clearance: 96 ml/min (by C-G formula based on Cr of 1.01).   Medical History: Past Medical History  Diagnosis Date  . Venous insufficiency   . AAA (abdominal aortic aneurysm)   . Ulcer   . DVT (deep venous thrombosis)   . Cellulitis   . Glaucoma   . Nicotine dependence   . Bronchospasm   . Elevated PSA   . BPH (benign prostatic hyperplasia)   . COPD (chronic obstructive pulmonary disease)   . Cognitive impairment     Medications:  Prescriptions prior to admission  Medication Sig Dispense Refill  . doxazosin (CARDURA) 8 MG tablet Take 0.5 tablets (4 mg total) by mouth at bedtime.      . finasteride (PROSCAR) 5 MG tablet Take 5 mg by mouth at bedtime.       [name]. latanoprost (XALATAN) 0.005 % ophthalmic solution Place 1 drop into both eyes at bedtime.       . torsemide (DEMADEX) 10 MG tablet Take 1 tablet (10 mg total) by mouth daily.  90 tablet  1  . warfarin (COUMADIN) 7.5 MG tablet Take 3.75-7.5 mg by mouth daily at 6 PM. Take 1/2(3.75) tablet on Tuesday Thursday saturdays and whole tablet rest of days.        Assessment: 80 y/o male admitted with progressive LE weakness on 4/20. Patient was treated with Coumadin  for a DVT (05/2013) for 3 months but this was recently resumed after having a repeat LE doppler 08/2013 that showed old clot burden in L venous system. Noted patient with recurrent falls at home. INR is therapeutic on low end of normal at 2.06 but missed Coumadin dose last night. No bleeding noted, CBC is stable.  Home dose is 7.5 mg daily except 3.75mg  TTS  Goal of Therapy:  INR 2-3 Monitor platelets by anticoagulation protocol: Yes   Plan:  - Coumadin 7.5 mg PO tonight - INR daily - Monitor for s/sx of bleeding  HiLLC[facility] Hospital SouthJennifer Kalihiwai, 17[address] Rainbow BoulevardPharm.D., BCPS Clinical Pharmacist Pager: [phone] 09/19/2013 11:17 AM

## 2013-09-19 NOTE — Evaluation (Addendum)
Physical Therapy Evaluation Patient Details Name: [name] MRN: [id number] DOB: 1931/06/11 Today's Date: 09/19/2013   History of Present Illness  This is a 80 y.o. year old male with noted prior history of chronic venous insufficiency , obesity, COPD, AAA, peripheral vascular disease, hx/o LLE DVT presenting with progressive weakness and falls at home with inability to bear weight on RLE since fall. RLe tib/fib xray negative but no imaging of femur.   Clinical Impression  Pt pleasant but confused with very limited mobility and continued report of RLE pain at thigh. Pt requires assist and cueing for all bed mobility and transfers with increased time. Pt with progressive decline at home and family not present to confirm assist available at home. Pt currently with above and below deficits and will benefit from acute therapy to maximize strength, mobility, transfers and safety with need for [facility]Ob at this time. Pt incontinent of mucous stool on arrival and unaware with total assist for pericare.    Follow Up Recommendations SNF;Supervision/Assistance - 24 hour    Equipment Recommendations  Wheelchair (measurements PT)    Recommendations for Other Services OT consult     Precautions / Restrictions Precautions Precautions: Fall Precaution Comments: obese      Mobility  Bed Mobility Overal bed mobility: Needs Assistance;+2 for physical assistance Bed Mobility: Rolling;Sidelying to Sit;Sit to Supine Rolling: Mod assist;+2 for physical assistance Sidelying to sit: Mod assist;+2 for physical assistance   Sit to supine: Mod assist;+2 for physical assistance   General bed mobility comments: pt able to scoot to [facility] in trendelen[place] with rail and cues for sequence. assist with pad and max cues for sequence with all transfers   Transfers Overall transfer level: Needs assistance   Transfers: Sit to/from Stand Sit to Stand: Max assist;+2 physical assistance;From elevated  surface         General transfer comment: attempted sit to stand from elevated bed x 2 with feet blocked and use of belt and pad, Pt able to shift weight anteriorly but could not clear buttock  Ambulation/Gait                Stairs            Wheelchair Mobility    Modified Rankin (Stroke Patients Only)       Balance Overall balance assessment: Needs assistance Sitting-balance support: Bilateral upper extremity supported;Feet supported Sitting balance-Leahy Scale: Poor Sitting balance - Comments: cues for sequence with min to minguard assist to correct posterior lean to midline Postural control: Posterior lean                                   Pertinent Vitals/Pain Pain grossly 6/10 RLE with movement no report of pain at rest BP 120/52 in sitting 131/50 in supine    Home Living Family/patient expects to be discharged to:: Private residence Living Arrangements: Spouse/significant other Available Help at Discharge: Family;Available 24 hours/day Type of Home: House Home Access: Ramped entrance     Home Layout: One level Home Equipment: Walker - 2 wheels;Cane - single point;Tub bench;Other (comment) (lift chair) Additional Comments: pt sleeps in a lift chairx 3 years    Prior Function Level of Independence: Independent with assistive device(s)         Comments: occasionally needs assist to bathe and dress family does all cooking and homemaking     Hand Dominance  Extremity/Trunk Assessment   Upper Extremity Assessment: Generalized weakness           Lower Extremity Assessment: Generalized weakness;RLE deficits/detail;LLE deficits/detail RLE Deficits / Details: pt with very limited knee flexion and gross strength 2-/5 with pain in Right thigh in sitting and with rolling or any mobility LLE Deficits / Details: grossly 2/5  Cervical / Trunk Assessment: Normal  Communication   Communication: No difficulties  Cognition  Arousal/Alertness: Awake/alert Behavior During Therapy: Flat affect Overall Cognitive Status: Impaired/Different from baseline Area of Impairment: Orientation;Memory;Safety/judgement;Problem solving;Following commands Orientation Level: Disoriented to;Time   Memory: Decreased short-term memory Following Commands: Follows one step commands with increased time Safety/Judgement: Decreased awareness of deficits;Decreased awareness of safety   Problem Solving: Slow processing;Decreased initiation;Difficulty sequencing;Requires verbal cues;Requires tactile cues General Comments: Pt unable to stand and yet repeatedly asking if we are going to get him walking today    General Comments      Exercises        Assessment/Plan    PT Assessment Patient needs continued PT services  PT Diagnosis Difficulty walking;Generalized weakness;Altered mental status;Acute pain   PT Problem List Decreased strength;Decreased cognition;Decreased range of motion;Decreased knowledge of use of DME;Decreased activity tolerance;Decreased balance;Decreased mobility;Pain;Obesity  PT Treatment Interventions DME instruction;Balance training;Neuromuscular re-education;Gait training;Functional mobility training;Cognitive remediation;Patient/family education;Therapeutic activities;Therapeutic exercise   PT Goals (Current goals can be found in the Care Plan section) Acute Rehab PT Goals Patient Stated Goal: be able to walk today PT Goal Formulation: With patient Time For Goal Achievement: 10/03/13 Potential to Achieve Goals: Fair    Frequency Min 3X/week   Barriers to discharge Decreased caregiver support      Co-evaluation               End of Session   Activity Tolerance: Patient limited by pain Patient left: in bed;with call bell/phone within reach;with bed alarm set Nurse Communication: Need for lift equipment;Mobility status    Functional Assessment Tool Used: clinical judgement Functional  Limitation: Mobility: Walking and moving around Mobility: Walking and Moving Around Current Status [phone]([id number]): At least 60 percent but less than 80 percent impaired, limited or restricted Mobility: Walking and Moving Around Goal Status [phone]([id number]): At least 20 percent but less than 40 percent impaired, limited or restricted    Time: 0927-0955 PT Time Calculation (min): 28 min   Charges:   PT Evaluation $Initial PT Evaluation Tier I: 1 Procedure PT Treatments $Therapeutic Activity: 23-37 mins   PT G Codes:   Functional Assessment Tool Used: clinical judgement Functional Limitation: Mobility: Walking and moving around    IntelMaija B Estelle Skibicki 09/19/2013, 10:54 AM Delaney MeigsMaija Tabor [employer] Kallio, PT [phone]

## 2013-09-19 NOTE — Consult Note (Signed)
NEURO HOSPITALIST CONSULT NOTE    Reason for Consult: falls and possible NPH  HPI:                                                                                                                                          [name] is an 80 y.o. male who fractures his hip in 2009. Since then he has had gait problems and needed to use a cane and eventually a walker. He was diagnosed with blood clot in his left leg in 05/2013. At that time he was placed on coumadin. Since being on coumadin his wife has noted he seems to be weaker than usual.  In fact he fell twice in march.  First fall was when he was walking through the doorway and then later in the night while standing to use a urinal. He was on Coumadin till March of 2015 at which time it was stopped.  He has had two more falls during that time. Both falls occurred while he was stationary, standing in the bathroom and his legs felt as though they could not bear the weight of his body.  His wife was present for one of these and stated she eased him to the floor.  The most recent fall occurred while she was gone--she found him on the floor bleeding. Most recently he was having left LE pain and had a repeat doppler. This showed a old clot burden in L venous system. Pt was restarted on coumadin. Due to his recent fall he was brought to the ED. MRI brain was obtained showing no acute infarct but does show sulcal effacement at the convexities may be seen with normal pressure hydrocephalus. In speaking with wife, he does have urinary incontinence, and has shown memory troubles since 2011.  He does not drive, his wife does the billing, his wife helps him bath and dress himself.  Throughout their marriage  His wife states she has done most of the house work with exception of taking out the trash or bringing wood into the house.  On a given day, he will remain stationary and watch TV all day.    Patient is hyper natremic with INR 2.02  Past  Medical History  Diagnosis Date  . Venous insufficiency   . AAA (abdominal aortic aneurysm)   . Ulcer   . DVT (deep venous thrombosis)   . Cellulitis   . Glaucoma   . Nicotine dependence   . Bronchospasm   . Elevated PSA   . BPH (benign prostatic hyperplasia)   . COPD (chronic obstructive pulmonary disease)   . Cognitive impairment     Past Surgical History  Procedure Laterality Date  . Back surgery    . Insitu percutaneous pinning femur Right 1986  .  Cholecystectomy    . Hip surgery Right 2009  . Back surgery  2004  . Spine surgery      Decompressive laminectomy unilaterally on the right at L4-5 with    Family History  Problem Relation Age of Onset  . Aneurysm Brother   . Heart attack Brother     10-29-12  . Cancer Father     colon     Social History:  reports that he quit smoking about 11 years ago. His smoking use included Cigarettes. He smoked 0.00 packs per day. His smokeless tobacco use includes Snuff. He reports that he does not drink alcohol or use illicit drugs.  Allergies  Allergen Reactions  . Oxycodone Hcl     REACTION: makes pt "wild"  . Propoxyphene N-Acetaminophen     REACTION: Makes pt. "wild"    MEDICATIONS:                                                                                                                     Prior to Admission:  Prescriptions prior to admission  Medication Sig Dispense Refill  . doxazosin (CARDURA) 8 MG tablet Take 0.5 tablets (4 mg total) by mouth at bedtime.      . finasteride (PROSCAR) 5 MG tablet Take 5 mg by mouth at bedtime.       [name] latanoprost (XALATAN) 0.005 % ophthalmic solution Place 1 drop into both eyes at bedtime.       . torsemide (DEMADEX) 10 MG tablet Take 1 tablet (10 mg total) by mouth daily.  90 tablet  1  . warfarin (COUMADIN) 7.5 MG tablet Take 3.75-7.5 mg by mouth daily at 6 PM. Take 1/2(3.75) tablet on Tuesday Thursday saturdays and whole tablet rest of days.       Scheduled: . doxazosin  4  mg Oral QHS  . finasteride  5 mg Oral QHS  . furosemide  40 mg Oral Daily  . potassium chloride  40 mEq Oral Once  . sodium chloride  3 mL Intravenous Q12H  . vancomycin  1,500 mg Intravenous Q12H  . vancomycin  2,500 mg Intravenous NOW  . warfarin  7.5 mg Oral ONCE-1800  . Warfarin - Pharmacist Dosing Inpatient   Does not apply q1800     ROS:  History obtained from wife  General ROS: negative for - chills, fatigue, fever, night sweats, weight gain or weight loss Psychological ROS: negative for - behavioral disorder, hallucinations, memory difficulties, mood swings or suicidal ideation Ophthalmic ROS: negative for - blurry vision, double vision, eye pain or loss of vision ENT ROS: negative for - epistaxis, nasal discharge, oral lesions, sore throat, tinnitus or vertigo Allergy and Immunology ROS: negative for - hives or itchy/watery eyes Hematological and Lymphatic ROS: negative for - bleeding problems, bruising or swollen lymph nodes Endocrine ROS: negative for - galactorrhea, hair pattern changes, polydipsia/polyuria or temperature intolerance Respiratory ROS: negative for - cough, hemoptysis, shortness of breath or wheezing Cardiovascular ROS: negative for - chest pain, dyspnea on exertion, edema or irregular heartbeat Gastrointestinal ROS: negative for - abdominal pain, diarrhea, hematemesis, nausea/vomiting or stool incontinence Genito-Urinary ROS: negative for - dysuria, hematuria, incontinence or urinary frequency/urgency Musculoskeletal ROS: negative for - joint swelling or muscular weakness Neurological ROS: as noted in HPI Dermatological ROS: negative for rash and skin lesion changes   Blood pressure 131/50, pulse 61, temperature 98.3 F (36.8 C), temperature source Oral, resp. rate 18, height 6\' 8"  (2.032 m), weight 146.693 kg (323 lb 6.4 oz),  SpO2 96.00%.   Neurologic Examination:                                                                                                      Mental Status: Alert, oriented to hospital, year, president but not month.  thought content appropriate.  Speech fluent without evidence of aphasia.  Able to follow 3 step commands without difficulty. In attempts to test his memory he was asked to spell WORLD --he declined to spell the word, he refused to do Serial 7's and Seriel 3's, he would not state what is ment by "do not cry over spilled milk".  Cranial Nerves: II: Discs flat bilaterally; Visual fields grossly normal, pupils equal, round, reactive to light and accommodation III,IV, VI: ptosis not present, extra-ocular motions intact bilaterally V,VII: smile symmetric, facial light touch sensation normal bilaterally VIII: hearing normal bilaterally IX,X: gag reflex present XI: bilateral shoulder shrug XII: midline tongue extension without atrophy or fasciculations  Motor: Right : Upper extremity   5/5    Left:     Upper extremity   5/5  Lower extremity   2/5 (significant leg pain)  Lower extremity   4-/5 --distal PF and DF 5/5 Tone and bulk:normal tone throughout; no atrophy noted Sensory: Pinprick and light touch intact throughout UE, LE has significant peripheral artery disease and he has no vibratory, proprioception, temperature or light touch from upper calf to foot.  Deep Tendon Reflexes:  Right: Upper Extremity   Left: Upper extremity   biceps (C-5 to C-6) 2/4   biceps (C-5 to C-6) 2/4 tricep (C7) 2/4    triceps (C7) 2/4 Brachioradialis (C6) 2/4  Brachioradialis (C6) 2/4  Lower Extremity Lower Extremity  quadriceps (L-2 to L-4) 0/4   quadriceps (L-2 to L-4) 0/4 Achilles (S1) 0/4   Achilles (S1) 0/4  Plantars: Mute bilaterally Cerebellar: normal finger-to-nose,  Unable to due  heel-to-shin test due to body habitus and pain Gait: unable to test due to pain.  CV: pulses palpable  throughout    Lab Results: Basic Metabolic Panel:  Recent Labs Lab 09/18/13 1602 09/19/13 0531  NA 150* 149*  K 4.2 3.6*  CL 110 109  CO2 28 27  GLUCOSE 129* 112*  BUN 18 19  CREATININE 1.00 1.01  CALCIUM 9.5 9.0    Liver Function Tests:  Recent Labs Lab 09/18/13 1602 09/19/13 0531  AST 40* 28  ALT 45 37  ALKPHOS 59 54  BILITOT 0.5 0.8  PROT 6.5 6.4  ALBUMIN 3.5 3.4*   No results found for this basename: LIPASE, AMYLASE,  in the last 168 hours No results found for this basename: AMMONIA,  in the last 168 hours  CBC:  Recent Labs Lab 09/18/13 1602 09/19/13 0531  WBC 8.0 8.0  NEUTROABS 5.8 4.7  HGB 13.2 12.7*  HCT 40.8 40.1  MCV 92.7 93.3  PLT 200 177    Cardiac Enzymes: No results found for this basename: CKTOTAL, CKMB, CKMBINDEX, TROPONINI,  in the last 168 hours  Lipid Panel: No results found for this basename: CHOL, TRIG, HDL, CHOLHDL, VLDL, LDLCALC,  in the last 168 hours  CBG: No results found for this basename: GLUCAP,  in the last 168 hours  Microbiology: Results for orders placed during the hospital encounter of 06/05/08  URINE CULTURE     Status: None   Collection Time    06/08/08  8:18 PM      Result Value Ref Range Status   Specimen Description URINE, RANDOM   Final   Special Requests IMMUNE:NORM UT SYMPT:NEG   Final   Colony Count 9,000 COLONIES/ML   Final   Culture INSIGNIFICANT GROWTH   Final   Report Status 06/10/2008 FINAL   Final    Coagulation Studies:  Recent Labs  09/18/13 1032 09/18/13 1602 09/19/13 1005  LABPROT  --  21.4* 22.2*  INR 2.6 1.92* 2.02*    Imaging: Ct Abdomen Pelvis Wo Contrast  09/18/2013   CLINICAL DATA:  Lower extremity pain and weakness after a fall today. History of abdominal aortic aneurysm.  EXAM: CT ABDOMEN AND PELVIS WITHOUT CONTRAST  TECHNIQUE: Multidetector CT imaging of the abdomen and pelvis was performed following the standard protocol without intravenous contrast.  COMPARISON:   12/30/2009  FINDINGS: Atelectasis in the lung bases.  Calcification in the aortic valve.  Evaluation of solid organs and vascular structures is limited due to the lack of IV contrast material. Surgical absence of the gallbladder. Biliary gas is likely postoperative. Diffuse fatty infiltration of the pancreas. Unenhanced appearance of the spleen, adrenal glands, inferior vena cava, and retroperitoneal lymph nodes is unremarkable. Multiple low-attenuation lesions in both kidneys, largest in the mid lower pole on the right measuring 6 cm diameter. These are consistent with cysts and were previously identified on the prior study. No hydronephrosis in either kidney. Punctate size calcifications in both kidneys consistent with nonobstructing stones. There is calcification of the abdominal aorta with focal dilatation to maximal AP diameter of 4.2 cm. This appears stable since previous study. No abnormal retroperitoneal fluid collections. The stomach, small bowel, and colon are not abnormally distended. No free air or free fluid in the abdomen. Prominent visceral adipose tissues.  Pelvis: There is a bladder stone measuring 2.1 cm diameter. This is unchanged since previous study. The prostate gland is enlarged, measuring 6.1 x 4.5 cm. No pelvic lymphadenopathy. The appendix  is normal. There is a focal fluid or scarring demonstrated in the right lower quadrant but this is adjacent to the cecum and is distant from the appendix. This could represent focal inflammation or nonspecific fluid. No evidence to suggest diverticulitis. Degenerative changes in the lumbar spine. Degenerative changes appear to cause central canal stenosis at at least the L2-3, L3-4, and L4-5 levels. No destructive bone lesions appreciated.  IMPRESSION: Abdominal aortic aneurysm measuring 4.2 cm maximal diameter. Punctate size nonobstructing renal stones bilaterally. Bilateral renal cysts. Fatty infiltration of the pancreas. Normal appendix. Bladder stone.  Prostate gland enlargement. Small nonspecific fluid or infiltration demonstrated in the right lower quadrant separate from the normal appendix.   Electronically Signed   By: Burman Nieves M.D.   On: 09/18/2013 22:49   Dg Chest 2 View  09/18/2013   CLINICAL DATA:  Weakness  EXAM: CHEST - 2 VIEW  COMPARISON:  DG CHEST 1V PORT dated 05/31/2008  FINDINGS: Borderline cardiomegaly allowing for AP technique. Vascular congestion. Bibasilar atelectasis. No Kerley B-lines. No pneumothorax. No pleural effusion.  IMPRESSION: Bibasilar atelectasis.   Electronically Signed   By: Maryclare Bean M.D.   On: 09/18/2013 18:06   Dg Tibia/fibula Right  09/18/2013   CLINICAL DATA:  Leg pain and swelling  EXAM: RIGHT TIBIA AND FIBULA - 2 VIEW  COMPARISON:  None.  FINDINGS: Four views of the right tibia-fibula submitted. No acute fracture or subluxation. Mild soft tissue swelling.  IMPRESSION: No acute fracture or subluxation.  Diffuse soft tissue swelling.   Electronically Signed   By: Natasha Mead M.D.   On: 09/18/2013 18:16   Mr Brain Wo Contrast  09/18/2013   CLINICAL DATA:  Right lower extremity weakness.  EXAM: MRI HEAD WITHOUT CONTRAST  TECHNIQUE: Multiplanar, multiecho pulse sequences of the brain and surrounding structures were obtained without intravenous contrast.  COMPARISON:  None.  FINDINGS: Mild motion degraded examination. No reduced diffusion to suggest acute ischemia. No susceptibility artifact to suggest hemorrhage. Minimal basal ganglia mineralization.  Moderate to severe ventriculomegaly, likely on the basis of global parenchymal brain volume loss as there is overall commensurate enlargement of cerebellar folia and sulci, though there is sulcal effacement at the convexities. 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 Minimal white matter T2 hyperintensities, less than expected for age suggest chronic small vessel ischemic disease.  No abnormal  extra-axial fluid collections. Normal major intracranial vascular flow voids preserved at the skullbase, however there is mild dolichoectasia of the intracranial vessels which can be related to chronic hypertension.  Status post bilateral ocular lens implants. Trace paranasal sinus mucosal thickening without air-fluid levels. Mastoid air cells are well aerated. Mild temporomandibular osteoarthrosis. No abnormal sellar expansion. No cerebellar tonsillar ectopia.  IMPRESSION: No acute intracranial process, specifically no evidence of acute ischemia on this mildly motion degraded examination.  Sulcal effacement at the convexities may be seen with normal pressure hydrocephalus.  Mild white matter changes likely reflect chronic small vessel ischemic disease, less than expected for age in addition to multiple remote left cerebellar infarcts.   Electronically Signed   By: Awilda Metro   On: 09/18/2013 23:33       Assessment and plan per attending neurologist  Felicie Morn PA-C [facility] [phone]  09/19/2013, 1:52 PM   Assessment/Plan: 80 YO male with progressive memory decline since 2011, gait instability since 2009 after right hip fracture and urinary urge incontinence. In discussing with wife, these symptoms have been  present for a while but worsened rather quickly over the last few months. Patient falls nearly all occurred while standing still and sis not have loss of consciousness. MRI obtained showed ventricular enlargement which can be seen in NPH. I suspect that he has a multifactorial gait disturbance from neuropathy and deconditioning.   His mental decline has been gradual over the past 4 years, much more prominent over the past 18 months. I suspect an underlying dementia and with the prominent fixations that his wife describes, would include frontotemporal dementia as a possibility. Neuropsychological testing would be helpful to further investigate this.   I would not  evaluate acutely for NPH given knee injury and anticoagulation. If his family discuss it and think that brain surgery would be something he would want to pursue, then a large volume LP with gait trial may be reasonable, but my suspicion for this is low.   Recommend:  1) Check B12, TSH, RPR 2) Follow up with outpatient neurology for neuropathy and dementia.   I have seen and evaluated the patient. I have reviewed the above note and made appropriate changes.   Joel SlotMcNeill Matthe Sloane, MD [facility] [phone]  If 7pm- 7am, please page neurology on call as listed in [facility].

## 2013-09-19 NOTE — Progress Notes (Signed)
[facility] PROGRESS NOTE  [name] ZOX:[id number] DOB: 04-26-1932 DOA: 09/18/2013 PCP: Redmond BasemanWONG,FRANCIS PATRICK, MD  Assessment/Plan: 1-Weakness, frequent fall;  PT, OT consult.  MRI negative for acute stroke, Sulcal effacement at the convexities may be seen with normal pressure hydrocephalus. I have neuro to evaluate patient.  TSH at 2.4.   -Bilateral Lower extremities swelling, redness. Patient already on coumadin which cover for DVT. Component of venous insufficiency. I will cover for cellulitis. I will start Vancomycin. I will change lasix to oral. Monitor hypernatremia.   -Small nonspecific fluid or infiltration demonstrated in the right lower quadrant separate from the normal appendix. : patient with no abdominal pain, no leukocytosis. Review scan with radiologist fluid is non specific.   -Right leg pain; will get hip x ray and femur x ray.   -Hypernatremia: mild. Change lasix to oral. Monitor sodium level on oral lasix.  -COPD: stable from a resp standpoint -History of DVT; continue with coumadin.  -Hypokalemia; replete with 40 meq times one.   Code Status: Full Code.  Family Communication: care discussed with wife who was at bedside.  Disposition Plan: Remain inpatient.    Consultants:  Neuro.     Procedures:  none  Antibiotics:  Vancomycin 4-21  HPI/Subjective: Patient complaining of right leg pain.  Wife notice bilateral lower extremities redness since one day prior to admission.   Objective: Filed Vitals:   09/19/13 1001  BP: 131/50  Pulse:   Temp:   Resp:     Intake/Output Summary (Last 24 hours) at 09/19/13 1305 Last data filed at 09/18/13 2056  Gross per 24 hour  Intake      0 ml  Output    725 ml  Net   -725 ml   Filed Weights   09/19/13 0024  Weight: 146.693 kg (323 lb 6.4 oz)    Exam:   General:  No distress.   Cardiovascular: S 1, S 2 RRR  Respiratory: CTA  Abdomen: BS present, distended, obese.   Musculoskeletal:  bilateral lower extremities redness, swelling.   Data Reviewed: Basic Metabolic Panel:  Recent Labs Lab 09/18/13 1602 09/19/13 0531  NA 150* 149*  K 4.2 3.6*  CL 110 109  CO2 28 27  GLUCOSE 129* 112*  BUN 18 19  CREATININE 1.00 1.01  CALCIUM 9.5 9.0   Liver Function Tests:  Recent Labs Lab 09/18/13 1602 09/19/13 0531  AST 40* 28  ALT 45 37  ALKPHOS 59 54  BILITOT 0.5 0.8  PROT 6.5 6.4  ALBUMIN 3.5 3.4*   No results found for this basename: LIPASE, AMYLASE,  in the last 168 hours No results found for this basename: AMMONIA,  in the last 168 hours CBC:  Recent Labs Lab 09/18/13 1602 09/19/13 0531  WBC 8.0 8.0  NEUTROABS 5.8 4.7  HGB 13.2 12.7*  HCT 40.8 40.1  MCV 92.7 93.3  PLT 200 177   Cardiac Enzymes: No results found for this basename: CKTOTAL, CKMB, CKMBINDEX, TROPONINI,  in the last 168 hours BNP (last 3 results)  Recent Labs  09/18/13 1958  PROBNP 245.5   CBG: No results found for this basename: GLUCAP,  in the last 168 hours  No results found for this or any previous visit (from the past 240 hour(s)).   Studies: Ct Abdomen Pelvis Wo Contrast  09/18/2013   CLINICAL DATA:  Lower extremity pain and weakness after a fall today. History of abdominal aortic aneurysm.  EXAM: CT ABDOMEN AND PELVIS WITHOUT CONTRAST  TECHNIQUE:  Multidetector CT imaging of the abdomen and pelvis was performed following the standard protocol without intravenous contrast.  COMPARISON:  12/30/2009  FINDINGS: Atelectasis in the lung bases.  Calcification in the aortic valve.  Evaluation of solid organs and vascular structures is limited due to the lack of IV contrast material. Surgical absence of the gallbladder. Biliary gas is likely postoperative. Diffuse fatty infiltration of the pancreas. Unenhanced appearance of the spleen, adrenal glands, inferior vena cava, and retroperitoneal lymph nodes is unremarkable. Multiple low-attenuation lesions in both kidneys, largest in the mid  lower pole on the right measuring 6 cm diameter. These are consistent with cysts and were previously identified on the prior study. No hydronephrosis in either kidney. Punctate size calcifications in both kidneys consistent with nonobstructing stones. There is calcification of the abdominal aorta with focal dilatation to maximal AP diameter of 4.2 cm. This appears stable since previous study. No abnormal retroperitoneal fluid collections. The stomach, small bowel, and colon are not abnormally distended. No free air or free fluid in the abdomen. Prominent visceral adipose tissues.  Pelvis: There is a bladder stone measuring 2.1 cm diameter. This is unchanged since previous study. The prostate gland is enlarged, measuring 6.1 x 4.5 cm. No pelvic lymphadenopathy. The appendix is normal. There is a focal fluid or scarring demonstrated in the right lower quadrant but this is adjacent to the cecum and is distant from the appendix. This could represent focal inflammation or nonspecific fluid. No evidence to suggest diverticulitis. Degenerative changes in the lumbar spine. Degenerative changes appear to cause central canal stenosis at at least the L2-3, L3-4, and L4-5 levels. No destructive bone lesions appreciated.  IMPRESSION: Abdominal aortic aneurysm measuring 4.2 cm maximal diameter. Punctate size nonobstructing renal stones bilaterally. Bilateral renal cysts. Fatty infiltration of the pancreas. Normal appendix. Bladder stone. Prostate gland enlargement. Small nonspecific fluid or infiltration demonstrated in the right lower quadrant separate from the normal appendix.   Electronically Signed   By: Burman NievesWilliam  Stevens M.D.   On: 09/18/2013 22:49   Dg Chest 2 View  09/18/2013   CLINICAL DATA:  Weakness  EXAM: CHEST - 2 VIEW  COMPARISON:  DG CHEST 1V PORT dated 05/31/2008  FINDINGS: Borderline cardiomegaly allowing for AP technique. Vascular congestion. Bibasilar atelectasis. No Kerley B-lines. No pneumothorax. No pleural  effusion.  IMPRESSION: Bibasilar atelectasis.   Electronically Signed   By: Maryclare BeanArt  Hoss M.D.   On: 09/18/2013 18:06   Dg Tibia/fibula Right  09/18/2013   CLINICAL DATA:  Leg pain and swelling  EXAM: RIGHT TIBIA AND FIBULA - 2 VIEW  COMPARISON:  None.  FINDINGS: Four views of the right tibia-fibula submitted. No acute fracture or subluxation. Mild soft tissue swelling.  IMPRESSION: No acute fracture or subluxation.  Diffuse soft tissue swelling.   Electronically Signed   By: Natasha MeadLiviu  Pop M.D.   On: 09/18/2013 18:16   Mr Brain Wo Contrast  09/18/2013   CLINICAL DATA:  Right lower extremity weakness.  EXAM: MRI HEAD WITHOUT CONTRAST  TECHNIQUE: Multiplanar, multiecho pulse sequences of the brain and surrounding structures were obtained without intravenous contrast.  COMPARISON:  None.  FINDINGS: Mild motion degraded examination. No reduced diffusion to suggest acute ischemia. No susceptibility artifact to suggest hemorrhage. Minimal basal ganglia mineralization.  Moderate to severe ventriculomegaly, likely on the basis of global parenchymal brain volume loss as there is overall commensurate enlargement of cerebellar folia and sulci, though there is sulcal effacement at the convexities. 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 Minimal white matter T2 hyperintensities, less than expected for age suggest chronic small vessel ischemic disease.  No abnormal extra-axial fluid collections. Normal major intracranial vascular flow voids preserved at the skullbase, however there is mild dolichoectasia of the intracranial vessels which can be related to chronic hypertension.  Status post bilateral ocular lens implants. Trace paranasal sinus mucosal thickening without air-fluid levels. Mastoid air cells are well aerated. Mild temporomandibular osteoarthrosis. No abnormal sellar expansion. No cerebellar tonsillar ectopia.  IMPRESSION: No acute intracranial  process, specifically no evidence of acute ischemia on this mildly motion degraded examination.  Sulcal effacement at the convexities may be seen with normal pressure hydrocephalus.  Mild white matter changes likely reflect chronic small vessel ischemic disease, less than expected for age in addition to multiple remote left cerebellar infarcts.   Electronically Signed   By: Awilda Metro   On: 09/18/2013 23:33    Scheduled Meds: . doxazosin  4 mg Oral QHS  . finasteride  5 mg Oral QHS  . furosemide  40 mg Intravenous Q12H  . sodium chloride  3 mL Intravenous Q12H  . vancomycin  1,500 mg Intravenous Q12H  . vancomycin  2,500 mg Intravenous NOW  . warfarin  7.5 mg Oral ONCE-1800  . Warfarin - Pharmacist Dosing Inpatient   Does not apply q1800   Continuous Infusions:   Active Problems:   Weakness    Time spent: 35 minutes.     Honorio Devol A Nancyann Cotterman  [facility] Pager [phone]. If 7PM-7AM, please contact night-coverage at [email or web address], password [facility] 09/19/2013, 1:05 PM  LOS: 1 day

## 2013-09-19 NOTE — Progress Notes (Signed)
ANTIBIOTIC CONSULT NOTE - INITIAL  Pharmacy Consult for vancomycin Indication: cellulitis  Allergies  Allergen Reactions  . Oxycodone Hcl     REACTION: makes pt "wild"  . Propoxyphene N-Acetaminophen     REACTION: Makes pt. "wild"    Patient Measurements: Height: 6\' 8"  (203.2 cm) Weight: 323 lb 6.4 oz (146.693 kg) IBW/kg (Calculated) : 96  Vital Signs: Temp: 98.3 F (36.8 C) (04/21 0512) Temp src: Oral (04/21 0512) BP: 131/50 mmHg (04/21 1001) Pulse Rate: 61 (04/21 0512) Intake/Output from previous day: 04/20 0701 - 04/21 0700 In: -  Out: 725 [Urine:725] Intake/Output from this shift:    Labs:  Recent Labs  09/18/13 1602 09/19/13 0531  WBC 8.0 8.0  HGB 13.2 12.7*  PLT 200 177  CREATININE 1.00 1.01   Estimated Creatinine Clearance: 96 ml/min (by C-G formula based on Cr of 1.01). No results found for this basename: VANCOTROUGH, VANCOPEAK, VANCORANDOM, GENTTROUGH, GENTPEAK, GENTRANDOM, TOBRATROUGH, TOBRAPEAK, TOBRARND, AMIKACINPEAK, AMIKACINTROU, AMIKACIN,  in the last 72 hours   Microbiology: No results found for this or any previous visit (from the past 720 hour(s)).  Medical History: Past Medical History  Diagnosis Date  . Venous insufficiency   . AAA (abdominal aortic aneurysm)   . Ulcer   . DVT (deep venous thrombosis)   . Cellulitis   . Glaucoma   . Nicotine dependence   . Bronchospasm   . Elevated PSA   . BPH (benign prostatic hyperplasia)   . COPD (chronic obstructive pulmonary disease)   . Cognitive impairment     Medications:  Prescriptions prior to admission  Medication Sig Dispense Refill  . doxazosin (CARDURA) 8 MG tablet Take 0.5 tablets (4 mg total) by mouth at bedtime.      . finasteride (PROSCAR) 5 MG tablet Take 5 mg by mouth at bedtime.       [name]. latanoprost (XALATAN) 0.005 % ophthalmic solution Place 1 drop into both eyes at bedtime.       . torsemide (DEMADEX) 10 MG tablet Take 1 tablet (10 mg total) by mouth daily.  90 tablet  1   . warfarin (COUMADIN) 7.5 MG tablet Take 3.75-7.5 mg by mouth daily at 6 PM. Take 1/2(3.75) tablet on Tuesday Thursday saturdays and whole tablet rest of days.       Assessment: 80 y/o male admitted with progressive LE weakness on 4/20. He has a history of chronic venous insufficiency and PVD. Pharmacy consulted to begin vancomycin for cellulitis. Renal function is normal, WBC are normal, and he is afebrile. Urine culture pending.  Goal of Therapy:  Vancomycin trough level 10-15 mcg/ml  Plan:  - Vancomycin 2500 mg IV x1 dose then 1500 mg IV q12h - Monitor renal function, clinical course, and culture data  [facility]-Portola CampusJennifer [place] Pra[place], Pharm.D., BCPS Clinical Pharmacist Pager: [phone] 09/19/2013 10:33 AM

## 2013-09-20 ENCOUNTER — Encounter (HOSPITAL_COMMUNITY): Payer: Self-pay | Admitting: General Practice

## 2013-09-20 ENCOUNTER — Observation Stay (HOSPITAL_COMMUNITY): Payer: Medicare Other

## 2013-09-20 DIAGNOSIS — F0390 Unspecified dementia without behavioral disturbance: Secondary | ICD-10-CM

## 2013-09-20 DIAGNOSIS — E538 Deficiency of other specified B group vitamins: Secondary | ICD-10-CM

## 2013-09-20 DIAGNOSIS — Z86718 Personal history of other venous thrombosis and embolism: Secondary | ICD-10-CM

## 2013-09-20 HISTORY — DX: Deficiency of other specified B group vitamins: E53.8

## 2013-09-20 LAB — CBC WITH DIFFERENTIAL/PLATELET
BASOS ABS: 0.1 10*3/uL (ref 0.0–0.1)
Basophils Relative: 1 % (ref 0–1)
Eosinophils Absolute: 0.5 10*3/uL (ref 0.0–0.7)
Eosinophils Relative: 7 % — ABNORMAL HIGH (ref 0–5)
HCT: 39 % (ref 39.0–52.0)
Hemoglobin: 12.6 g/dL — ABNORMAL LOW (ref 13.0–17.0)
LYMPHS PCT: 19 % (ref 12–46)
Lymphs Abs: 1.5 10*3/uL (ref 0.7–4.0)
MCH: 30.1 pg (ref 26.0–34.0)
MCHC: 32.3 g/dL (ref 30.0–36.0)
MCV: 93.3 fL (ref 78.0–100.0)
Monocytes Absolute: 1.2 10*3/uL — ABNORMAL HIGH (ref 0.1–1.0)
Monocytes Relative: 16 % — ABNORMAL HIGH (ref 3–12)
NEUTROS PCT: 57 % (ref 43–77)
Neutro Abs: 4.5 10*3/uL (ref 1.7–7.7)
PLATELETS: 163 10*3/uL (ref 150–400)
RBC: 4.18 MIL/uL — ABNORMAL LOW (ref 4.22–5.81)
RDW: 16.5 % — AB (ref 11.5–15.5)
WBC: 7.8 10*3/uL (ref 4.0–10.5)

## 2013-09-20 LAB — COMPREHENSIVE METABOLIC PANEL
ALBUMIN: 3.1 g/dL — AB (ref 3.5–5.2)
ALK PHOS: 51 U/L (ref 39–117)
ALT: 29 U/L (ref 0–53)
AST: 42 U/L — ABNORMAL HIGH (ref 0–37)
BILIRUBIN TOTAL: 0.9 mg/dL (ref 0.3–1.2)
BUN: 18 mg/dL (ref 6–23)
CHLORIDE: 111 meq/L (ref 96–112)
CO2: 26 mEq/L (ref 19–32)
CREATININE: 0.98 mg/dL (ref 0.50–1.35)
Calcium: 8.7 mg/dL (ref 8.4–10.5)
GFR calc Af Amer: 88 mL/min — ABNORMAL LOW (ref >90)
GFR, EST NON AFRICAN AMERICAN: 76 mL/min — AB (ref >90)
GLUCOSE: 102 mg/dL — AB (ref 70–99)
Potassium: 3.4 mEq/L — ABNORMAL LOW (ref 3.7–5.3)
Sodium: 149 mEq/L — ABNORMAL HIGH (ref 137–147)
Total Protein: 6 g/dL (ref 6.0–8.3)

## 2013-09-20 LAB — PROTIME-INR
INR: 1.91 — ABNORMAL HIGH (ref 0.00–1.49)
Prothrombin Time: 21.3 seconds — ABNORMAL HIGH (ref 11.6–15.2)

## 2013-09-20 MED ORDER — ACETAMINOPHEN 325 MG PO TABS
650.0000 mg | ORAL_TABLET | ORAL | Status: DC | PRN
  Administered 2013-09-20 – 2013-09-24 (×3): 650 mg via ORAL
  Filled 2013-09-20 (×2): qty 2

## 2013-09-20 MED ORDER — VITAMIN B-12 1000 MCG PO TABS
1000.0000 ug | ORAL_TABLET | Freq: Every day | ORAL | Status: DC
  Administered 2013-09-20 – 2013-09-25 (×6): 1000 ug via ORAL
  Filled 2013-09-20 (×6): qty 1

## 2013-09-20 MED ORDER — WARFARIN SODIUM 10 MG PO TABS
10.0000 mg | ORAL_TABLET | Freq: Once | ORAL | Status: DC
  Filled 2013-09-20: qty 1

## 2013-09-20 MED ORDER — POTASSIUM CHLORIDE CRYS ER 20 MEQ PO TBCR
40.0000 meq | EXTENDED_RELEASE_TABLET | Freq: Once | ORAL | Status: AC
  Administered 2013-09-20: 40 meq via ORAL
  Filled 2013-09-20: qty 2

## 2013-09-20 MED ORDER — WARFARIN SODIUM 5 MG PO TABS
5.0000 mg | ORAL_TABLET | Freq: Once | ORAL | Status: AC
  Administered 2013-09-20: 5 mg via ORAL
  Filled 2013-09-20: qty 1

## 2013-09-20 NOTE — Progress Notes (Signed)
Patient changed to inpatient- requiring IV antibiotics for cellulitis

## 2013-09-20 NOTE — Progress Notes (Addendum)
CONSULT NOTE - FOLLOW UP  Pharmacy Consult for coumadin Indication: hx DVT  Allergies  Allergen Reactions  . Oxycodone Hcl     REACTION: makes pt "wild"  . Propoxyphene N-Acetaminophen     REACTION: Makes pt. "wild"    Patient Measurements: Height: 6\' 8"  (203.2 cm) Weight: 323 lb 14.4 oz (146.92 kg) IBW/kg (Calculated) : 96  Vital Signs: Temp: 99.2 F (37.3 C) (04/22 0501) Temp src: Oral (04/22 0501) BP: 120/43 mmHg (04/22 0501) Pulse Rate: 79 (04/22 0501) Intake/Output from previous day: 04/21 0701 - 04/22 0700 In: -  Out: 1725 [Urine:1725] Intake/Output from this shift:    Labs:  Recent Labs  09/18/13 1602 09/19/13 0531 09/20/13 0442  WBC 8.0 8.0 7.8  HGB 13.2 12.7* 12.6*  PLT 200 177 163  CREATININE 1.00 1.01 0.98   Estimated Creatinine Clearance: 99 ml/min (by C-G formula based on Cr of 0.98). No results found for this basename: Rolm GalaVANCOTROUGH, VANCOPEAK, VANCORANDOM, GENTTROUGH, GENTPEAK, GENTRANDOM, TOBRATROUGH, TOBRAPEAK, TOBRARND, AMIKACINPEAK, AMIKACINTROU, AMIKACIN,  in the last 72 hours     Assessment: 80 y/o male admitted with progressive LE weakness on 4/20. Patient was treated with Coumadin for a DVT (05/2013) for 3 months but this was recently resumed after having a repeat LE doppler 08/2013 that showed old clot burden in L venous system. Noted patient with recurrent falls at home. INR is 1.91 with trend down and noted coumadin dose was missed on 4/20.  Home dose: 3.75mg /day except 7.5mg  TTS per spouse   Goal of Therapy:  INR= 2-3  Plan:  -Coumadin 5mg  po today -Daily PT/INR   Harland GermanAndrew Diyana Starrett, Pharm D 09/20/2013 8:24 AM

## 2013-09-20 NOTE — Progress Notes (Signed)
Clinical Social Work Department BRIEF PSYCHOSOCIAL ASSESSMENT 09/20/2013  Patient:  [name],[name]     Account Number:  [id number]     Admit date:  09/18/2013  Clinical Social Worker:  Joel NakayamaAMBELAL,[name], LCSWA  Date/Time:  09/20/2013 10:00 AM  Referred by:  Physician  Date Referred:  09/20/2013  Other Referral:   Interview type:  Family Other interview type:   Spoke with pt daughter over the phone    PSYCHOSOCIAL DATA Living Status:  WIFE Admitted from facility:   Level of care:   Primary support name:  [name] [phone] Primary support relationship to patient:  CHILD, ADULT Degree of support available:   Pt has supportive family    CURRENT CONCERNS Current Concerns  Post-Acute Placement   Other Concerns:    SOCIAL WORK ASSESSMENT / PLAN CSW aware that PT is recommending SNF. CSW visited pt room but pt was out of room for procedure. CSW called pt contact on facesheet, daughter [name] and spoke with her about PT recommendation. Pt daughter somewhat aware of recommendation but informed CSW that family does not want to make any decisions until they have had the opportunity to discuss pt condition further with MD. CSW explained referral process and asked if CSW could proceed with this and that it would not commit them to this dc plan. Pt daughter was agreeable to [facility]. Pt daughter did ask that CSW not speak with pt about this. CSW explained that pt is oriented so pt will have to agree to this plan, but if daughter would like, CSW would wait to speak with pt until after pt family has had Mays chance to discuss SNF first. Pt daughter was understanding and agreeable to this plan. CSW paged MD to notify that pt daughter would like an update when possible.   Assessment/plan status:  Psychosocial Support/Ongoing Assessment of Needs Other assessment/ plan:   Information/referral to community resources:   SNF list to be provided with bed offers     PATIENT'S/FAMILY'S RESPONSE TO PLAN OF CARE: Pt family undecided about dc plan at this time. They would like to keep all options open.       [name], LCSWA [phone]

## 2013-09-20 NOTE — Progress Notes (Signed)
Patient ID: [name]  male  ZOX:[id number]    DOB: [birth year]    DOA: 09/18/2013  PCP: Redmond Baseman, MD  Assessment/Plan: Principal Problem:  1-Weakness, frequent fall;  - PT, OT consult. Recommending skilled nursing facility - MRI negative for acute stroke, Sulcal effacement at the convexities may be seen with normal pressure hydrocephalus. Patient was seen by neurology, Dr. Amada Jupiter, recommended outpatient neurology workup for neuropathy and dementia. Per neurology, would not evaluate acutely for NPH given knee injury and anticoagulation - TSH at 2.4., B12 level low  Active problems  B12 deficiency - Started on B12 replacement  Bilateral Lower extremities swelling, redness. Patient already on coumadin which cover for DVT. Component of venous insufficiency.  - Continue IV vancomycin for cellulitis   Small nonspecific fluid or infiltration demonstrated in the right lower quadrant separate from the normal appendix : patient with no abdominal pain, no leukocytosis. Review scan with radiologist fluid is non specific.   Right leg pain; and unable to even lift up the right leg, had frequent falls, 2 falls in last week with progressive weakness - MRI brain negative for ac CVA - hip x ray and femur x ray negative for fracture, CT right hip negative .  - Patient's daughter told me that he had a lumbar spine surgery for disc many years ago, by Dr Wynetta Emery. Patient has complained about back pain off and on but she does not know if he had fallen on his back. Will obtain CT of the lumbar spine.  -Hypernatremia: mild, Monitor sodium level on oral lasix.   -COPD: stable from a resp standpoint   -History of DVT; continue with coumadin.   -Hypokalemia;  - replete with 40 meq x1    DVT Prophylaxis:  Code Status:  Family Communication: d/w patient's daughter, [name] in detail  Disposition: Will need skilled nursing facility, family interested in  SNF  Consultants:    Procedures: None  Antibiotics:  IV vanc    Subjective: Feels unable to lift up the right leg per patient for the last 1 week, poor historian   Objective: Weight change: 0.227 kg (8 oz)  Intake/Output Summary (Last 24 hours) at 09/20/13 1029 Last data filed at 09/20/13 0900  Gross per 24 hour  Intake    240 ml  Output   1725 ml  Net  -1485 ml   Blood pressure 120/43, pulse 79, temperature 99.2 F (37.3 C), temperature source Oral, resp. rate 18, height 6\' 8"  (2.032 m), weight 146.92 kg (323 lb 14.4 oz), SpO2 94.00%.  Physical Exam: General: Alert and awake, oriented x3, not in any acute distress. CVS: S1-S2 clear, no murmur rubs or gallops Chest: clear to auscultation bilaterally, no wheezing, rales or rhonchi Abdomen: soft nontender, nondistended, normal bowel sounds  Extremities: no cyanosis, clubbing, chronic venous changes bilaterally with ?cellulitis, unable to lift up his right leg  Neuro: unable to lift up his right leg, left leg 5/5 strength   Lab Results: Basic Metabolic Panel:  Recent Labs Lab 09/19/13 0531 09/20/13 0442  NA 149* 149*  K 3.6* 3.4*  CL 109 111  CO2 27 26  GLUCOSE 112* 102*  BUN 19 18  CREATININE 1.01 0.98  CALCIUM 9.0 8.7   Liver Function Tests:  Recent Labs Lab 09/19/13 0531 09/20/13 0442  AST 28 42*  ALT 37 29  ALKPHOS 54 51  BILITOT 0.8 0.9  PROT 6.4 6.0  ALBUMIN 3.4* 3.1*   No results found for this basename:  LIPASE, AMYLASE,  in the last 168 hours No results found for this basename: AMMONIA,  in the last 168 hours CBC:  Recent Labs Lab 09/19/13 0531 09/20/13 0442  WBC 8.0 7.8  NEUTROABS 4.7 4.5  HGB 12.7* 12.6*  HCT 40.1 39.0  MCV 93.3 93.3  PLT 177 163   Cardiac Enzymes: No results found for this basename: CKTOTAL, CKMB, CKMBINDEX, TROPONINI,  in the last 168 hours BNP: No components found with this basename: POCBNP,  CBG: No results found for this basename: GLUCAP,  in the  last 168 hours   Micro Results: Recent Results (from the past 240 hour(s))  URINE CULTURE     Status: None   Collection Time    09/18/13  4:32 PM      Result Value Ref Range Status   Specimen Description URINE, CLEAN CATCH   Final   Special Requests NONE   Final   Culture  Setup Time     Final   Value: 09/18/2013 17:21     Performed at [facility] Count     Final   Value: NO GROWTH     Performed at [facility]   Culture     Final   Value: NO GROWTH     Performed at [facility]   Report Status 09/19/2013 FINAL   Final    Studies/Results: Ct Abdomen Pelvis Wo Contrast  09/18/2013   CLINICAL DATA:  Lower extremity pain and weakness after a fall today. History of abdominal aortic aneurysm.  EXAM: CT ABDOMEN AND PELVIS WITHOUT CONTRAST  TECHNIQUE: Multidetector CT imaging of the abdomen and pelvis was performed following the standard protocol without intravenous contrast.  COMPARISON:  12/30/2009  FINDINGS: Atelectasis in the lung bases.  Calcification in the aortic valve.  Evaluation of solid organs and vascular structures is limited due to the lack of IV contrast material. Surgical absence of the gallbladder. Biliary gas is likely postoperative. Diffuse fatty infiltration of the pancreas. Unenhanced appearance of the spleen, adrenal glands, inferior vena cava, and retroperitoneal lymph nodes is unremarkable. Multiple low-attenuation lesions in both kidneys, largest in the mid lower pole on the right measuring 6 cm diameter. These are consistent with cysts and were previously identified on the prior study. No hydronephrosis in either kidney. Punctate size calcifications in both kidneys consistent with nonobstructing stones. There is calcification of the abdominal aorta with focal dilatation to maximal AP diameter of 4.2 cm. This appears stable since previous study. No abnormal retroperitoneal fluid collections. The stomach, small bowel, and colon are not  abnormally distended. No free air or free fluid in the abdomen. Prominent visceral adipose tissues.  Pelvis: There is a bladder stone measuring 2.1 cm diameter. This is unchanged since previous study. The prostate gland is enlarged, measuring 6.1 x 4.5 cm. No pelvic lymphadenopathy. The appendix is normal. There is a focal fluid or scarring demonstrated in the right lower quadrant but this is adjacent to the cecum and is distant from the appendix. This could represent focal inflammation or nonspecific fluid. No evidence to suggest diverticulitis. Degenerative changes in the lumbar spine. Degenerative changes appear to cause central canal stenosis at at least the L2-3, L3-4, and L4-5 levels. No destructive bone lesions appreciated.  IMPRESSION: Abdominal aortic aneurysm measuring 4.2 cm maximal diameter. Punctate size nonobstructing renal stones bilaterally. Bilateral renal cysts. Fatty infiltration of the pancreas. Normal appendix. Bladder stone. Prostate gland enlargement. Small nonspecific fluid or infiltration demonstrated in the right  lower quadrant separate from the normal appendix.   Electronically Signed   By: Burman NievesWilliam  Stevens M.D.   On: 09/18/2013 22:49   Dg Chest 2 View  09/18/2013   CLINICAL DATA:  Weakness  EXAM: CHEST - 2 VIEW  COMPARISON:  DG CHEST 1V PORT dated 05/31/2008  FINDINGS: Borderline cardiomegaly allowing for AP technique. Vascular congestion. Bibasilar atelectasis. No Kerley B-lines. No pneumothorax. No pleural effusion.  IMPRESSION: Bibasilar atelectasis.   Electronically Signed   By: Maryclare BeanArt  Hoss M.D.   On: 09/18/2013 18:06   Dg Hip Complete Right  09/19/2013   CLINICAL DATA:  Status post fall 2 days ago.  Right hip pain.  EXAM: RIGHT HIP - COMPLETE 2+ VIEW  COMPARISON:  Plain films right hip 05/28/2008.  FINDINGS: Dynamic hip screw is in place for a healed intertrochanteric fracture. Hardware is intact. There is partial visualization of plate and screws for fixation of a right femur  fracture. No acute bony or joint abnormality is identified. The left hip is unremarkable.  IMPRESSION: No acute finding. Status post fixation of right hip and femur fractures.   Electronically Signed   By: Drusilla Kannerhomas  Dalessio M.D.   On: 09/19/2013 18:13   Dg Femur Right  09/19/2013   CLINICAL DATA:  Status post fall 2 days ago.  Right femoral pain.  EXAM: RIGHT FEMUR - 2 VIEW  COMPARISON:  Plain films of the right femur 05/28/2008.  FINDINGS: No acute bony or joint abnormality is identified. The patient has a healed distal femur fracture with plate and screws in place. The plate is broken but unchanged. Small knee joint effusion is identified.  IMPRESSION: No acute abnormality. Healed distal femur fracture with hardware in place. Stable compared to prior exam.   Electronically Signed   By: Drusilla Kannerhomas  Dalessio M.D.   On: 09/19/2013 18:16   Dg Tibia/fibula Right  09/18/2013   CLINICAL DATA:  Leg pain and swelling  EXAM: RIGHT TIBIA AND FIBULA - 2 VIEW  COMPARISON:  None.  FINDINGS: Four views of the right tibia-fibula submitted. No acute fracture or subluxation. Mild soft tissue swelling.  IMPRESSION: No acute fracture or subluxation.  Diffuse soft tissue swelling.   Electronically Signed   By: Natasha MeadLiviu  Pop M.D.   On: 09/18/2013 18:16   Mr Brain Wo Contrast  09/18/2013   CLINICAL DATA:  Right lower extremity weakness.  EXAM: MRI HEAD WITHOUT CONTRAST  TECHNIQUE: Multiplanar, multiecho pulse sequences of the brain and surrounding structures were obtained without intravenous contrast.  COMPARISON:  None.  FINDINGS: Mild motion degraded examination. No reduced diffusion to suggest acute ischemia. No susceptibility artifact to suggest hemorrhage. Minimal basal ganglia mineralization.  Moderate to severe ventriculomegaly, likely on the basis of global parenchymal brain volume loss as there is overall commensurate enlargement of cerebellar folia and sulci, though there is sulcal effacement at the convexities. Multiple  small to moderate left cerebellar infarcts in the left superior cerebellar artery vascular territory. Minimal white matter T2 hyperintensities, less than expected for age suggest chronic small vessel ischemic disease.  No abnormal extra-axial fluid collections. Normal major intracranial vascular flow voids preserved at the skullbase, however there is mild dolichoectasia of the intracranial vessels which can be related to chronic hypertension.  Status post bilateral ocular lens implants. Trace paranasal sinus mucosal thickening without air-fluid levels. Mastoid air cells are well aerated. Mild temporomandibular osteoarthrosis. No abnormal sellar expansion. No cerebellar tonsillar ectopia.  IMPRESSION: No acute intracranial process, specifically no evidence of acute ischemia on  this mildly motion degraded examination.  Sulcal effacement at the convexities may be seen with normal pressure hydrocephalus.  Mild white matter changes likely reflect chronic small vessel ischemic disease, less than expected for age in addition to multiple remote left cerebellar infarcts.   Electronically Signed   By: Awilda Metro   On: 09/18/2013 23:33   Ct Hip Right Wo Contrast  09/20/2013   CLINICAL DATA:  Right hip pain and limited range of motion.  EXAM: CT OF THE RIGHT HIP WITHOUT CONTRAST  TECHNIQUE: Multidetector CT imaging was performed according to the standard protocol. Multiplanar CT image reconstructions were also generated.  COMPARISON:  Plain films right hip and femur 09/19/2013. CT abdomen and pelvis 09/18/2013.  FINDINGS: No acute fracture is identified. The patient has a healed right intertrochanteric fracture with fixation hardware in place. Hardware is intact. The right hip is located. There is no notable degenerative disease about the right hip. No avascular necrosis of the right femoral head is seen. There is no hip joint effusion. All visualized musculature is intact and normal in appearance. Imaged intrapelvic  contents demonstrate urinary bladder stone as seen on the comparison CT.  IMPRESSION: Negative for fracture.  No acute abnormality.  Healed right intertrochanteric fracture with intact hardware in place.  Urinary bladder stone.   Electronically Signed   By: Drusilla Kanner M.D.   On: 09/20/2013 09:55   US Venous Img Lower Bilateral  09/08/2013   CLINICAL DATA:  Bilateral lower extremity edema; history of previous DVT in the left leg  EXAM: BILATERAL LOWER EXTREMITY VENOUS DOPPLER ULTRASOUND  TECHNIQUE: Gray-scale sonography with graded compression, as well as color Doppler and duplex ultrasound were performed to evaluate the lower extremity deep venous systems from the level of the common femoral vein and including the common femoral, femoral, profunda femoral, popliteal and calf veins including the posterior tibial, peroneal and gastrocnemius veins when visible. The superficial great saphenous vein was also interrogated. Spectral Doppler was utilized to evaluate flow at rest and with distal augmentation maneuvers in the common femoral, femoral and popliteal veins.  COMPARISON:  None.  FINDINGS: RIGHT LOWER EXTREMITY  Common Femoral Vein: No evidence of thrombus. Normal compressibility, respiratory phasicity and response to augmentation.  Saphenofemoral Junction: No evidence of thrombus. Normal compressibility and flow on color Doppler imaging.  Profunda Femoral Vein: No evidence of thrombus. Normal compressibility and flow on color Doppler imaging.  Femoral Vein: No evidence of thrombus. Normal compressibility, respiratory phasicity and response to augmentation.  Popliteal Vein: No evidence of thrombus. Normal compressibility, respiratory phasicity and response to augmentation.  Calf Veins: No evidence of thrombus. Normal compressibility and flow on color Doppler imaging.  Superficial Great Saphenous Vein: No evidence of thrombus. Normal compressibility and flow on color Doppler imaging.  Venous Reflux:  None.   Other Findings: The peroneal vein and anterior tibial vein were not visualized on the right.  LEFT LOWER EXTREMITY  Common Femoral Vein: No evidence of thrombus. Normal compressibility, respiratory phasicity and response to augmentation.  Saphenofemoral Junction: No evidence of thrombus. Normal compressibility and flow on color Doppler imaging.  Profunda Femoral Vein: No evidence of thrombus. Normal compressibility and flow on color Doppler imaging.  Femoral Vein: There is nonocclusive thrombus within the superficial femoral vein.  Popliteal Vein: There is nonocclusive thrombus within the popliteal vein.  Calf Veins: No evidence of thrombus. Normal compressibility and flow on color Doppler imaging.  Superficial Great Saphenous Vein: No evidence of thrombus. Normal compressibility and flow  on color Doppler imaging.  Venous Reflux:  None.  Other Findings: The peroneal and anterior tibial veins were not demonstrated on the left.  IMPRESSION: 1. There is no evidence of deep venous thrombosis in the visualized veins of the right lower extremity. 2. On the left there is likely old thrombus which is nonocclusive in the superficial femoral and popliteal veins. 3. The large amount of edema as well as erythema and ulceration of the legs limits the study somewhat.   Electronically Signed   By: David  SwazilandJordan   On: 09/08/2013 15:47    Medications: Scheduled Meds: . doxazosin  4 mg Oral QHS  . finasteride  5 mg Oral QHS  . furosemide  40 mg Oral Daily  . sodium chloride  3 mL Intravenous Q12H  . vancomycin  1,500 mg Intravenous Q12H  . vitamin B-12  1,000 mcg Oral Daily  . warfarin  10 mg Oral ONCE-1800  . Warfarin - Pharmacist Dosing Inpatient   Does not apply q1800      LOS: 2 days   Ripudeep Jenna LuoK Rai M.D. [facility] 09/20/2013, 10:29 AM Pager: [phone]  If 7PM-7AM, please contact night-coverage [email or web address] Password [email or web address]  **Disclaimer: This note was dictated with voice recognition software.  Similar sounding words can inadvertently be transcribed and this note may contain transcription errors which may not have been corrected upon publication of note.**

## 2013-09-20 NOTE — Progress Notes (Addendum)
Clinical Social Work Department CLINICAL SOCIAL WORK PLACEMENT NOTE 09/20/2013  Patient:  Waymond CeraEAL,Erika A  Account Number:  [id number] Admit date:  09/18/2013  Clinical Social Worker:  Sharol HarnessPOONUM AMBELAL, Theresia MajorsLCSWA  Date/time:  09/20/2013 11:00 AM  Clinical Social Work is seeking post-discharge placement for this patient at the following level of care:   SKILLED NURSING   (*CSW will update this form in Epic as items are completed)   09/20/2013  [facility]family provided with [facility]'s list of facilities offering this level of care within the geographic area requested by the patient (or if unable, by the patient's family).  09/20/2013  Patient/family informed of their freedom to choose among providers that offer the needed level of care, that participate in Medicare, Medicaid or managed care program needed by the patient, have an available bed and are willing to accept the patient.  09/20/2013  Patient/family informed of [facility]' ownership interest in [facility], as well as of the fact that they are under no obligation to receive care at this facility.  PASARR submitted to EDS on  Existing PASARR number received from EDS on   FL2 transmitted to all facilities in geographic area requested by pt/family on  09/20/2013 FL2 transmitted to all facilities within larger geographic area on   Patient informed that his/her managed care company has contracts with or will negotiate with  certain facilities, including the following:     Patient/family informed of bed offers received:  09/21/2013 Patient chooses bed at [facility]  Physician recommends and patient chooses bed at    [facility] 09/25/2013 [facility]  The following physician request were entered in Epic:   Additional Comments: Pt PASRR existing under DOB 08/17/1971  Addendum: PASARR DOB corrected in their  system to 09-11-1931.  Harless Nakayamaoonum Ambelal, LCSWA [phone]   Maryclare LabradorJulie Anderson, MSW, [facility] Clinical Social Worker [phone]

## 2013-09-20 NOTE — Evaluation (Signed)
Occupational Therapy Evaluation Patient Details Name: [name] MRN: [id number] DOB: August 10, 1931 Today's Date: 09/20/2013    History of Present Illness This is a 80 y.o. year old male with noted prior history of chronic venous insufficiency , obesity, COPD, AAA, peripheral vascular disease, hx/o LLE DVT presenting with progressive weakness and falls at home with inability to bear weight on RLE since fall. RLe tib/fib xray negative but no imaging of femur.    Clinical Impression   Pt with hx of falls presents with progressive weakness, difficulty with mobility and impaired cognition.  Pt was largely sedentary at home, sleeping and sitting in his lift chair except to go to toilet and shower.   Pt requires +2 assist for mobility and is bedlevel dependent in ADL.  He will need SNF level rehab prior to return home with his wife who is also elderly. Pt's wife avoided conversation about discharge disposition, stating she will not make that decision.    Follow Up Recommendations  SNF;Supervision/Assistance - 24 hour (wife is hesitant to make this decision)    Equipment Recommendations       Recommendations for Other Services       Precautions / Restrictions Precautions Precautions: Fall Precaution Comments: obese      Mobility Bed Mobility Overal bed mobility: Needs Assistance;+2 for physical assistance Bed Mobility: Rolling;Sidelying to Sit;Sit to Supine Rolling: Mod assist;+2 for physical assistance Sidelying to sit: Mod assist;+2 for physical assistance   Sit to supine: Mod assist;+2 for physical assistance      Transfers                      Balance     Sitting balance-Leahy Scale: Poor                                      ADL Overall ADL's : Needs assistance/impaired Eating/Feeding: Set up;Bed level   Grooming: Wash/dry hands;Wash/dry face;Supervision/safety;Bed level   Upper Body Bathing: Moderate assistance;Sitting   Lower Body Bathing:  Total assistance;Bed level   Upper Body Dressing : Minimal assistance;Bed level   Lower Body Dressing: Total assistance;Bed level                 General ADL Comments: Sitting EOB requires UE assist to balance, unable to perform ADL in sitting.     Vision                     Perception     Praxis      Pertinent Vitals/Pain R LE with movement, repositioned     Hand Dominance Right   Extremity/Trunk Assessment Upper Extremity Assessment Upper Extremity Assessment: Overall WFL for tasks assessed   Lower Extremity Assessment Lower Extremity Assessment: Defer to PT evaluation   Cervical / Trunk Assessment Cervical / Trunk Assessment: Normal   Communication Communication Communication: No difficulties   Cognition Arousal/Alertness: Awake/alert Behavior During Therapy: Flat affect Overall Cognitive Status: Impaired/Different from baseline Area of Impairment: Orientation;Memory;Safety/judgement;Problem solving;Following commands Orientation Level: Disoriented to;Time   Memory: Decreased short-term memory (decreased long term memory) Following Commands: Follows one step commands with increased time Safety/Judgement: Decreased awareness of deficits;Decreased awareness of safety   Problem Solving: Slow processing;Decreased initiation;Difficulty sequencing;Requires verbal cues;Requires tactile cues     General Comments       Exercises       Shoulder Instructions  Home Living Family/patient expects to be discharged to:: Private residence Living Arrangements: Spouse/significant other Available Help at Discharge: Family;Available 24 hours/day Type of Home: House Home Access: Ramped entrance     Home Layout: One level     Bathroom Shower/Tub: Chief Strategy OfficerTub/shower unit   Bathroom Toilet: Handicapped height Bathroom Accessibility: Yes How Accessible: Accessible via walker Home Equipment: Walker - 2 wheels;Cane - single point;Tub bench;Other (comment)    Additional Comments: pt sleeps in a lift chairx 3 years      Prior Functioning/Environment Level of Independence: Needs assistance  Gait / Transfers Assistance Needed: walks to bathroom and back to lift chair only, falls have occur getting up from toilet per sister in law ADL's / Homemaking Assistance Needed: Wife does cooking and cleaning, assists with shower transfer,washing periarea, drying feet and dressing.        OT Diagnosis: Generalized weakness;Cognitive deficits;Acute pain   OT Problem List: Decreased strength;Decreased activity tolerance;Impaired balance (sitting and/or standing);Decreased cognition;Decreased safety awareness;Decreased knowledge of use of DME or AE;Obesity;Pain   OT Treatment/Interventions: Self-care/ADL training;DME and/or AE instruction;Therapeutic activities;Cognitive remediation/compensation;Balance training;Patient/family education    OT Goals(Current goals can be found in the care plan section) Acute Rehab OT Goals Patient Stated Goal: be able to walk today OT Goal Formulation: With patient Time For Goal Achievement: 10/04/13 Potential to Achieve Goals: Fair ADL Goals Pt Will Perform Grooming: with supervision;sitting Pt Will Perform Upper Body Bathing: with min assist;sitting Pt Will Perform Lower Body Bathing: with max assist;sit to/from stand Pt Will Perform Upper Body Dressing: with min assist;sitting Pt Will Perform Lower Body Dressing: with max assist;sit to/from stand Pt Will Transfer to Toilet: stand pivot transfer;bedside commode;with mod assist Pt Will Perform Toileting - Clothing Manipulation and hygiene: with max assist;sit to/from stand  OT Frequency: Min 2X/week   Barriers to D/C:            Co-evaluation              End of Session Nurse Communication: Other (comment) (pt asking about eye drops)  Activity Tolerance: Patient limited by fatigue (pt stating my questions made him tired) Patient left: in bed;with call  bell/phone within reach;with family/visitor present   Time: 1120-1145 OT Time Calculation (min): 25 min Charges:    G-Codes: OT G-codes **NOT FOR INPATIENT CLASS** Functional Assessment Tool Used: clinical judgement Functional Limitation: Self care Self Care Current Status ([id number]): At least 80 percent but less than 100 percent impaired, limited or restricted Self Care Goal Status (U0454(G8988): At least 40 percent but less than 60 percent impaired, limited or restricted  Dayton BailiffJulie Lynn Sherrina Zaugg 09/20/2013, 12:02 PM [id number]

## 2013-09-21 DIAGNOSIS — S8001XA Contusion of right knee, initial encounter: Secondary | ICD-10-CM | POA: Diagnosis present

## 2013-09-21 DIAGNOSIS — M25461 Effusion, right knee: Secondary | ICD-10-CM | POA: Diagnosis present

## 2013-09-21 LAB — CBC WITH DIFFERENTIAL/PLATELET
BASOS PCT: 0 % (ref 0–1)
Basophils Absolute: 0 10*3/uL (ref 0.0–0.1)
EOS ABS: 0.2 10*3/uL (ref 0.0–0.7)
Eosinophils Relative: 1 % (ref 0–5)
HCT: 38.6 % — ABNORMAL LOW (ref 39.0–52.0)
HEMOGLOBIN: 12.6 g/dL — AB (ref 13.0–17.0)
LYMPHS ABS: 0.9 10*3/uL (ref 0.7–4.0)
Lymphocytes Relative: 8 % — ABNORMAL LOW (ref 12–46)
MCH: 30.3 pg (ref 26.0–34.0)
MCHC: 32.6 g/dL (ref 30.0–36.0)
MCV: 92.8 fL (ref 78.0–100.0)
Monocytes Absolute: 1.5 10*3/uL — ABNORMAL HIGH (ref 0.1–1.0)
Monocytes Relative: 13 % — ABNORMAL HIGH (ref 3–12)
NEUTROS PCT: 78 % — AB (ref 43–77)
Neutro Abs: 9.3 10*3/uL — ABNORMAL HIGH (ref 1.7–7.7)
Platelets: 188 10*3/uL (ref 150–400)
RBC: 4.16 MIL/uL — AB (ref 4.22–5.81)
RDW: 16.2 % — ABNORMAL HIGH (ref 11.5–15.5)
WBC: 12 10*3/uL — ABNORMAL HIGH (ref 4.0–10.5)

## 2013-09-21 LAB — COMPREHENSIVE METABOLIC PANEL
ALT: 26 U/L (ref 0–53)
AST: 32 U/L (ref 0–37)
Albumin: 3.1 g/dL — ABNORMAL LOW (ref 3.5–5.2)
Alkaline Phosphatase: 55 U/L (ref 39–117)
BUN: 17 mg/dL (ref 6–23)
CHLORIDE: 109 meq/L (ref 96–112)
CO2: 24 meq/L (ref 19–32)
CREATININE: 0.94 mg/dL (ref 0.50–1.35)
Calcium: 8.8 mg/dL (ref 8.4–10.5)
GFR, EST AFRICAN AMERICAN: 89 mL/min — AB (ref >90)
GFR, EST NON AFRICAN AMERICAN: 77 mL/min — AB (ref >90)
Glucose, Bld: 119 mg/dL — ABNORMAL HIGH (ref 70–99)
Potassium: 3.8 mEq/L (ref 3.7–5.3)
Sodium: 147 mEq/L (ref 137–147)
Total Bilirubin: 1.1 mg/dL (ref 0.3–1.2)
Total Protein: 6.2 g/dL (ref 6.0–8.3)

## 2013-09-21 LAB — PROTIME-INR
INR: 2.31 — AB (ref 0.00–1.49)
PROTHROMBIN TIME: 24.6 s — AB (ref 11.6–15.2)

## 2013-09-21 LAB — VANCOMYCIN, TROUGH: Vancomycin Tr: 17.6 ug/mL (ref 10.0–20.0)

## 2013-09-21 MED ORDER — IBUPROFEN 800 MG PO TABS
800.0000 mg | ORAL_TABLET | Freq: Once | ORAL | Status: AC
Start: 2013-09-21 — End: 2013-09-21
  Administered 2013-09-21: 800 mg via ORAL
  Filled 2013-09-21: qty 1

## 2013-09-21 MED ORDER — LIDOCAINE HCL 1 % IJ SOLN
10.0000 mL | Freq: Once | INTRAMUSCULAR | Status: DC
  Filled 2013-09-21: qty 10

## 2013-09-21 MED ORDER — HYDROCOD POLST-CHLORPHEN POLST 10-8 MG/5ML PO LQCR
5.0000 mL | Freq: Two times a day (BID) | ORAL | Status: DC | PRN
  Administered 2013-09-21: 5 mL via ORAL
  Filled 2013-09-21: qty 5

## 2013-09-21 MED ORDER — VANCOMYCIN HCL 10 G IV SOLR
1250.0000 mg | Freq: Two times a day (BID) | INTRAVENOUS | Status: DC
  Administered 2013-09-21: 1250 mg via INTRAVENOUS
  Filled 2013-09-21 (×3): qty 1250

## 2013-09-21 MED ORDER — LATANOPROST 0.005 % OP SOLN
1.0000 [drp] | Freq: Every day | OPHTHALMIC | Status: DC
  Administered 2013-09-21 – 2013-09-24 (×4): 1 [drp] via OPHTHALMIC
  Filled 2013-09-21 (×2): qty 2.5

## 2013-09-21 MED ORDER — WARFARIN SODIUM 5 MG PO TABS
5.0000 mg | ORAL_TABLET | Freq: Once | ORAL | Status: AC
  Administered 2013-09-21: 5 mg via ORAL
  Filled 2013-09-21: qty 1

## 2013-09-21 NOTE — Progress Notes (Signed)
CSW (Clinical Child psychotherapistocial Worker) received call from pt daughter. Pt daughter requesting to meet with CSW tomorrow. CSW asked for pt daughter to just call CSW when she is at the hospital. During conversation, CSW provided with bed offers. Pt daughter [facility] for CS[facility] [place]  Karyssa Amaral, LCSWA [phone]

## 2013-09-21 NOTE — Progress Notes (Addendum)
ANTICOAGULATION and ANTIBIOTIC CONSULT NOTE - Follow Up Consult  Pharmacy Consult for coumadin; vancomycin Indication: hx DVT; cellulitis  Allergies  Allergen Reactions  . Oxycodone Hcl     REACTION: makes pt "wild"  . Propoxyphene N-Acetaminophen     REACTION: Makes pt. "wild"    Patient Measurements: Height: 6\' 8"  (203.2 cm) Weight: 325 lb 3.2 oz (147.51 kg) IBW/kg (Calculated) : 96  Vital Signs: Temp: 97.7 F (36.5 C) (04/23 0511) Temp src: Oral (04/23 0511) BP: 114/51 mmHg (04/23 0511) Pulse Rate: 83 (04/23 0511)  Labs:  Recent Labs  09/19/13 0531 09/19/13 1005 09/20/13 0442 09/21/13 0504  HGB 12.7*  --  12.6* 12.6*  HCT 40.1  --  39.0 38.6*  PLT 177  --  163 188  LABPROT  --  22.2* 21.3* 24.6*  INR  --  2.02* 1.91* 2.31*  CREATININE 1.01  --  0.98 0.94    Estimated Creatinine Clearance: 103.4 ml/min (by C-G formula based on Cr of 0.94).   Assessment: Patient is an 80 y.o M on coumadin for hx DVT.  INR is therapeutic today at 2.31.  No bleeding documented.  He's also on vancomycin for LE cellulitis. He remains afebrile but wbc increased to 12 today.    Vanc 4/21>> UCx - neg   Goal of Therapy:  INR 2-3; vancomycin  Trough 10-15    Plan:  1) coumadin 5 mg PO x1 today  2) check vancomycin trough level this morning  Angelize Ryce P Najib Colmenares 09/21/2013,9:55 AM  Adden: vancomycin trough now back slightly elevated at 17.6.  Will change dose to 1250mg  IV q12h.

## 2013-09-21 NOTE — Consult Note (Signed)
Reason for Consult: Right Leg Pain and Weakness  Referring Physician: Dr. Estill Cotta  [name] is an 80 y.o. male.  HPI: Patient is an 80 year old male that was brought into [facility] for injuries sustained during a fall back on Monday 09/18/2013.  He was at home where he lives with his wife when the fall occurred.  He was in the bathroom getting up off the commode when he fell injuring his left arm and his right lower extremity.  He has been dependent upon the walker for ambulation and had it sitting at the bathroom door.  His wife states that he will park the walker at the door but uses the doorknob and the bathroom counter for balance getting in and out of the bathroom.  He fell onto the floor where his wife found him.  The patient is documented to have cognitive impairment and is not a great historian.  He does not recall specifics concerning the fall and does not recall any dizziness or lightheadedness.  He was unable to ambulate and brought to the hospital for evaluation. The patient has had several falls in the past at home.  It is noted that he is on Coumadin for DVT treatment.  He was originally diagnosed in December 2014 with a blood clot in the leg as per his wife.  He was placed on Coumadin and followed up this March for a recheck.  The family stated that he complained of increased weakness since going on the Coumadin.  The Coumadin was discontinued at that time.  He followed back up on April 10 th earlier this month and had repeat doppler which proved positive for old clot burden.  He was placed back on Coumadin again. Initial history was taken from the patient today on visit but the wife arrived near the end of our visit today and much of his history was obtained through her.  Past history includes a previous right femur fracture that dates back to 1986 which required ORIF.  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  He has a lateral supracondylar plate with screws which had hardware failure and plate fracture with the nonunion but now has thick surrounding healed callous at the fracture site. He also underwent ORIF of the right hip back in December 2009 for an apparent inter troch hip fracture with a compression screw and side plate. The wife states that he was initially on the walker following the hip fracture for a while but eventually was able to come off of it.  Due to the weakness he had been experiencing while on the coumadin, the patient went back onto a cane for a while but has been more dependent upon the walker for the past two months. Since being in the hospital, he has undergone a Neurology consult and is felt that he likely has a frontotemporal dementia and recommends further testing as an outpatient.    Past Medical History  Diagnosis Date  . Venous insufficiency   . AAA (abdominal aortic aneurysm)   . Ulcer   . DVT (deep venous thrombosis) 05/04/2013    "LLE"  . Cellulitis   . Nicotine dependence   . Bronchospasm   . Elevated PSA   . BPH (benign prostatic hyperplasia)   . COPD (chronic obstructive pulmonary disease)   . Cognitive impairment   . Daily headache   . Arthritis     "  probably on the right leg/hip" (09/20/2013)  . Dementia     "don't know exactly what kind" (09/20/2013)  . Kidney stones   . Glaucoma, both eyes     Past Surgical History  Procedure Laterality Date  . Cataract extraction w/ intraocular lens  implant, bilateral Bilateral   . Insitu percutaneous pinning femur Right 1986  . Hip fracture surgery Right 2009  . Back surgery    . Posterior laminectomy / decompression lumbar spine  2004    Decompressive laminectomy unilaterally on the right at L4-5 with  . Cholecystectomy  2011  . Femur fracture surgery Right 04/1985  . Cystoscopy w/ stone manipulation  1970's    "once"    Family History  Problem Relation Age of Onset  . Aneurysm Brother   . Heart  attack Brother     10-29-12  . Cancer Father     colon    Social History:  reports that he quit smoking about 11 years ago. His smoking use included Cigarettes. He has a 108 pack-year smoking history. His smokeless tobacco use includes Snuff. He reports that he does not drink alcohol or use illicit drugs.  Allergies:  Allergies  Allergen Reactions  . Oxycodone Hcl     REACTION: makes pt "wild"  . Propoxyphene N-Acetaminophen     REACTION: Makes pt. "wild"    Home Medications: Xalatan drops Demadex Coumadin Cardura Proscar   Results for orders placed during the hospital encounter of 09/18/13 (from the past 48 hour(s))  HEMOGLOBIN A1C     Status: Abnormal   Collection Time    09/19/13  3:55 PM      Result Value Ref Range   Hemoglobin A1C 5.8 (*) <5.7 %   Comment: (NOTE)                                                                               According to the ADA Clinical Practice Recommendations for 2011, when     HbA1c is used as a screening test:      >=6.5%   Diagnostic of Diabetes Mellitus               (if abnormal result is confirmed)     5.7-6.4%   Increased risk of developing Diabetes Mellitus     References:Diagnosis and Classification of Diabetes Mellitus,Diabetes     Care,2011,34(Suppl 1):S62-S69 and Standards of Medical Care in             Diabetes - 2011,Diabetes Care,2011,34 (Suppl 1):S11-S61.   Mean Plasma Glucose 120 (*) <117 mg/dL   Comment: Performed at [facility]  VITAMIN B12     Status: Abnormal   Collection Time    09/19/13  3:55 PM      Result Value Ref Range   Vitamin B-12 192 (*) 211 - 911 pg/mL   Comment: Performed at [facility]  TSH     Status: None   Collection Time    09/19/13  3:55 PM      Result Value Ref Range   TSH 1.290  0.350 - 4.500 uIU/mL   Comment: Please note change in reference range.  RPR     Status:  None   Collection Time    09/19/13  3:55 PM      Result Value Ref Range   RPR NON REAC  NON REAC    Comment: Performed at [place]     Status: Abnormal   Collection Time    09/20/13  4:42 AM      Result Value Ref Range   Sodium 149 (*) 137 - 147 mEq/L   Potassium 3.4 (*) 3.7 - 5.3 mEq/L   Chloride 111  96 - 112 mEq/L   CO2 26  19 - 32 mEq/L   Glucose, Bld 102 (*) 70 - 99 mg/dL   BUN 18  6 - 23 mg/dL   Creatinine, Ser 0.98  0.50 - 1.35 mg/dL   Calcium 8.7  8.4 - 10.5 mg/dL   Total Protein 6.0  6.0 - 8.3 g/dL   Albumin 3.1 (*) 3.5 - 5.2 g/dL   AST 42 (*) 0 - 37 U/L   ALT 29  0 - 53 U/L   Alkaline Phosphatase 51  39 - 117 U/L   Total Bilirubin 0.9  0.3 - 1.2 mg/dL   GFR calc non Af Amer 76 (*) >90 mL/min   GFR calc Af Amer 88 (*) >90 mL/min   Comment: (NOTE)     The eGFR has been calculated using the CKD EPI equation.     This calculation has not been validated in all clinical situations.     eGFR's persistently <90 mL/min signify possible Chronic Kidney     Disease.  CBC WITH DIFFERENTIAL     Status: Abnormal   Collection Time    09/20/13  4:42 AM      Result Value Ref Range   WBC 7.8  4.0 - 10.5 K/uL   Comment: WHITE COUNT CONFIRMED ON SMEAR   RBC 4.18 (*) 4.22 - 5.81 MIL/uL   Hemoglobin 12.6 (*) 13.0 - 17.0 g/dL   HCT 39.0  39.0 - 52.0 %   MCV 93.3  78.0 - 100.0 fL   MCH 30.1  26.0 - 34.0 pg   MCHC 32.3  30.0 - 36.0 g/dL   RDW 16.5 (*) 11.5 - 15.5 %   Platelets 163  150 - 400 K/uL   Comment: PLATELET COUNT CONFIRMED BY SMEAR   Neutrophils Relative % 57  43 - 77 %   Lymphocytes Relative 19  12 - 46 %   Monocytes Relative 16 (*) 3 - 12 %   Eosinophils Relative 7 (*) 0 - 5 %   Basophils Relative 1  0 - 1 %   Neutro Abs 4.5  1.7 - 7.7 K/uL   Lymphs Abs 1.5  0.7 - 4.0 K/uL   Monocytes Absolute 1.2 (*) 0.1 - 1.0 K/uL   Eosinophils Absolute 0.5  0.0 - 0.7 K/uL   Basophils Absolute 0.1  0.0 - 0.1 K/uL   RBC Morphology ELLIPTOCYTES     WBC Morphology ATYPICAL LYMPHOCYTES    PROTIME-INR     Status: Abnormal   Collection Time     09/20/13  4:42 AM      Result Value Ref Range   Prothrombin Time 21.3 (*) 11.6 - 15.2 seconds   INR 1.91 (*) 0.00 - 1.49  COMPREHENSIVE METABOLIC PANEL     Status: Abnormal   Collection Time    09/21/13  5:04 AM      Result Value Ref Range   Sodium 147  137 - 147 mEq/L  Potassium 3.8  3.7 - 5.3 mEq/L   Chloride 109  96 - 112 mEq/L   CO2 24  19 - 32 mEq/L   Glucose, Bld 119 (*) 70 - 99 mg/dL   BUN 17  6 - 23 mg/dL   Creatinine, Ser 0.94  0.50 - 1.35 mg/dL   Calcium 8.8  8.4 - 10.5 mg/dL   Total Protein 6.2  6.0 - 8.3 g/dL   Albumin 3.1 (*) 3.5 - 5.2 g/dL   AST 32  0 - 37 U/L   ALT 26  0 - 53 U/L   Alkaline Phosphatase 55  39 - 117 U/L   Total Bilirubin 1.1  0.3 - 1.2 mg/dL   GFR calc non Af Amer 77 (*) >90 mL/min   GFR calc Af Amer 89 (*) >90 mL/min   Comment: (NOTE)     The eGFR has been calculated using the CKD EPI equation.     This calculation has not been validated in all clinical situations.     eGFR's persistently <90 mL/min signify possible Chronic Kidney     Disease.  CBC WITH DIFFERENTIAL     Status: Abnormal   Collection Time    09/21/13  5:04 AM      Result Value Ref Range   WBC 12.0 (*) 4.0 - 10.5 K/uL   RBC 4.16 (*) 4.22 - 5.81 MIL/uL   Hemoglobin 12.6 (*) 13.0 - 17.0 g/dL   HCT 38.6 (*) 39.0 - 52.0 %   MCV 92.8  78.0 - 100.0 fL   MCH 30.3  26.0 - 34.0 pg   MCHC 32.6  30.0 - 36.0 g/dL   RDW 16.2 (*) 11.5 - 15.5 %   Platelets 188  150 - 400 K/uL   Neutrophils Relative % 78 (*) 43 - 77 %   Neutro Abs 9.3 (*) 1.7 - 7.7 K/uL   Lymphocytes Relative 8 (*) 12 - 46 %   Lymphs Abs 0.9  0.7 - 4.0 K/uL   Monocytes Relative 13 (*) 3 - 12 %   Monocytes Absolute 1.5 (*) 0.1 - 1.0 K/uL   Eosinophils Relative 1  0 - 5 %   Eosinophils Absolute 0.2  0.0 - 0.7 K/uL   Basophils Relative 0  0 - 1 %   Basophils Absolute 0.0  0.0 - 0.1 K/uL  PROTIME-INR     Status: Abnormal   Collection Time    09/21/13  5:04 AM      Result Value Ref Range   Prothrombin Time 24.6  (*) 11.6 - 15.2 seconds   INR 2.31 (*) 0.00 - 1.49    Dg Hip Complete Right  09/19/2013   CLINICAL DATA:  Status post fall 2 days ago.  Right hip pain.  EXAM: RIGHT HIP - COMPLETE 2+ VIEW  COMPARISON:  Plain films right hip 05/28/2008.  FINDINGS: Dynamic hip screw is in place for a healed intertrochanteric fracture. Hardware is intact. There is partial visualization of plate and screws for fixation of a right femur fracture. No acute bony or joint abnormality is identified. The left hip is unremarkable.  IMPRESSION: No acute finding. Status post fixation of right hip and femur fractures.   Electronically Signed   By: Inge Rise M.D.   On: 09/19/2013 18:13   Dg Femur Right  09/19/2013   CLINICAL DATA:  Status post fall 2 days ago.  Right femoral pain.  EXAM: RIGHT FEMUR - 2 VIEW  COMPARISON:  Plain films of the right  femur 05/28/2008.  FINDINGS: No acute bony or joint abnormality is identified. The patient has a healed distal femur fracture with plate and screws in place. The plate is broken but unchanged. Small knee joint effusion is identified.  IMPRESSION: No acute abnormality. Healed distal femur fracture with hardware in place. Stable compared to prior exam.   Electronically Signed   By: Inge Rise M.D.   On: 09/19/2013 18:16   Ct Lumbar Spine Wo Contrast  09/20/2013   CLINICAL DATA:  History of lumbar spine surgery. Right leg weakness. Rule out spinal stenosis.  EXAM: CT LUMBAR SPINE WITHOUT CONTRAST  TECHNIQUE: Multidetector CT imaging of the lumbar spine was performed without intravenous contrast administration. Multiplanar CT image reconstructions were also generated.  COMPARISON:  None.  FINDINGS: Some lumbar CT images were retrospectively acquired from a CT abdomen pelvis on 09/18/2013.  Atherosclerotic aorta with the infrarenal abdominal aortic aneurysm measuring up to 44 x 36 mm. Large bladder stone measures 19 mm.  Negative for lumbar spine fracture or mass. No acute bony change.  The lumbar alignment is normal.  T12-L1:  Mild disc and facet degeneration with mild spinal stenosis  L1-2: Disc bulging and facet degeneration with moderate spinal stenosis.  L2-3: Disc desiccation and vacuum disc phenomenon. Disc bulging and spondylosis. Bilateral facet hypertrophy. Moderate to severe spinal stenosis. Neural foraminal encroachment bilaterally  L3-4: Diffuse disc bulging. Bilateral facet and ligamentum flavum hypertrophy. Severe spinal stenosis. Marked lateral recess and foraminal encroachment bilaterally.  L4-5: Laminotomy on the right. Disc degeneration and spondylosis. Bilateral facet hypertrophy. There is foraminal encroachment bilaterally. Spinal canal is adequate in size.  L5-S1: Spondylosis and facet degeneration causing foraminal narrowing bilaterally.  IMPRESSION: Negative for lumbar fracture or mass lesion.  Multilevel disc and facet degeneration. Multilevel spinal stenosis. Foraminal encroachment is present bilaterally at multiple levels due to spurring.  Abdominal aortic aneurysm measuring 44 x 36 mm.  Large bladder stone.   Electronically Signed   By: Franchot Gallo M.D.   On: 09/20/2013 18:35   Ct Hip Right Wo Contrast  09/20/2013   CLINICAL DATA:  Right hip pain and limited range of motion.  EXAM: CT OF THE RIGHT HIP WITHOUT CONTRAST  TECHNIQUE: Multidetector CT imaging was performed according to the standard protocol. Multiplanar CT image reconstructions were also generated.  COMPARISON:  Plain films right hip and femur 09/19/2013. CT abdomen and pelvis 09/18/2013.  FINDINGS: No acute fracture is identified. The patient has a healed right intertrochanteric fracture with fixation hardware in place. Hardware is intact. The right hip is located. There is no notable degenerative disease about the right hip. No avascular necrosis of the right femoral head is seen. There is no hip joint effusion. All visualized musculature is intact and normal in appearance. Imaged intrapelvic contents  demonstrate urinary bladder stone as seen on the comparison CT.  IMPRESSION: Negative for fracture.  No acute abnormality.  Healed right intertrochanteric fracture with intact hardware in place.  Urinary bladder stone.   Electronically Signed   By: Inge Rise M.D.   On: 09/20/2013 09:55    ROS Blood pressure 114/51, pulse 83, temperature 97.7 F (36.5 C), temperature source Oral, resp. rate 18, height $RemoveBe'6\' 8"'TeelEbpQv$  (2.032 m), weight 147.51 kg (325 lb 3.2 oz), SpO2 92.00%. Physical Exam  Constitutional: He appears well-developed and well-nourished.  HENT:  Head: Normocephalic and atraumatic.  Musculoskeletal: Lumbar Spine - non tender on exam Pelvis - stable with compression Right Hip - no pain on hip roll,  non tender groin area, non tender lateral hip Right Knee - pain on attempted flexion of the joint, large effusion noted on exam, tender medial joint line greater than lateral joint line.  Old healed abrasion over the medial knee (from a previous fall mentioned by the wife). Right Leg - non tender anterior tibia but doe shave some discomfort on calf compression proximally.  Venous stasis changes on both legs covering the lower two-thirds of the legs. Right Foot - no pain on ankle ROM but does have some pain noted on the plantar aspect of the foot in the arch.  Motor function - grossly intact in the right lower extremity.  Radiographs: Plain films are reviewed and no acute fractures are noted in the pelvis, right hip (S/P fixation of the right hip), right femur (healed old right distal femur fracture), or right tibial region. CT Lumbar Spine - Negative for lumbar fracture or mass lesion. Multilevel disc and facet degeneration. Multilevel spinal stenosis.  Foraminal encroachment is present bilaterally at multiple levels due to spurring. Abdominal aortic aneurysm measuring 44 x 36 mm. Large bladder stone. CT Right Hip - Negative for fracture. No acute abnormality. Healed right intertrochanteric  fracture with intact hardware in  place. Urinary bladder stone.  Assessment/Plan: Right Leg Weakness following traumatic fall at home. Right knee pain with effusion.  History has been obtained thru the patient and his wife.  The patient has been examined and radiographs have been reviewed.  The main positive findings on exam suggests issues with the right knee.  The wife denies a history of gout.  History points more toward traumatic cause.  With the noted effusion on the knee, there is concern that he may have developed a hematoma from the fall while on coumadin.  Some of his X-rays also suggest degenerative changes in the knee and may have experienced a sever arthritic flare from the fall.  He had complained more about weakness and difficulty walking over the past two months which may also have been from the knee and/or coumadin as per the wife.  He knee may have even buckled on him causing him to fall while getting up and off the commode.  Will review the case with Dr. Wynelle Link who will be by the follow up with the patient later today.  May consider aspiration of the knee in order to decompress for pain relief and consider a cortisone injection if felt to be due to a flare of underlying arthritis. If the knee is the main source of his pain, aspiration/injection may benefit the patient and assists him getting up and working with therapy.  With this being said, it may not be the entire source of his pain and weakness with the stenotic changes in the spine.  Joel Muslim, PA-C  [name] 09/21/2013, 11:01 AM   I have seen and examined the patient and agree with the above assessment and findings. The patient feels better post-aspiration of his right knee. He no longer has an effusion and the knee is non-tender. He was probably having pain from the hemarthrosis and should now be better suited to participate in PT. Would recommend restarting PT for WBAT RLE and he may need SNF level rehab  post-discharge to get him back to safe ambulation.

## 2013-09-21 NOTE — Progress Notes (Signed)
Patient ID: [name]  male  ZOX:[id number]    DOB: 1931-10-12    DOA: 09/18/2013  PCP: Redmond BasemanWONG,FRANCIS PATRICK, MD  Assessment/Plan: Principal Problem:  1-Weakness, frequent fall;  - PT, OT consult. Recommending skilled nursing facility - MRI negative for acute stroke, Sulcal effacement at the convexities may be seen with normal pressure hydrocephalus. Patient was seen by neurology, Dr. [name], recommended outpatient neurology workup for neuropathy and dementia. Per neurology, would not evaluate acutely for NPH given knee injury and anticoagulation - TSH at 2.4., B12 level low  Active problems  B12 deficiency - Started on B12 replacement  Bilateral Lower extremities swelling, redness. Patient already on coumadin which cover for DVT. Component of venous insufficiency.  - Continue IV vancomycin for cellulitis   Small nonspecific fluid or infiltration demonstrated in the right lower quadrant separate from the normal appendix : patient with no abdominal pain, no leukocytosis. Review scan with radiologist fluid is non specific.   Right leg pain; and unable to even lift up the right leg, had frequent falls, 2 falls in last week with progressive weakness - MRI brain negative for ac CVA - hip x ray and femur x ray negative for fracture, CT right hip negative, I have requested orthopedics to evaluate him .  - Patient's daughter told me that he had a lumbar spine surgery for disc many years ago, by Dr Wynetta Emeryram. Patient has complained about back pain off and on but she does not know if he had fallen on his back. - CT of the lumbar spine shows multilevel disc and possible degeneration, multilevel spinal stenosis, foramina encroachment bilateral at multiple levels due to spurring, possibly causing right leg pain? Would he be candidate for ESI injection? Will continue physical therapy and await further orthopedics recommendations.   -Hypernatremia: mild, improving Monitor sodium level on oral lasix.     -COPD: stable from a resp standpoint   -History of DVT; continue with coumadin.   -Hypokalemia;  - replete with 40 meq x1    DVT Prophylaxis:  Code Status:  Family Communication: d/w patient's daughter, Alona BeneJoyce in detail on the phone  Disposition: Will need skilled nursing facility  Consultants:  Orthopedics  Procedures: None  Antibiotics:  IV vanc    Subjective:  hearing deficit with poor historian. Still unable to lift up his right leg, states "pain"    Objective: Weight change: 0.59 kg (1 lb 4.8 oz)  Intake/Output Summary (Last 24 hours) at 09/21/13 1041 Last data filed at 09/21/13 0900  Gross per 24 hour  Intake    363 ml  Output   1000 ml  Net   -637 ml   Blood pressure 114/51, pulse 83, temperature 97.7 F (36.5 C), temperature source Oral, resp. rate 18, height 6\' 8"  (2.032 m), weight 147.51 kg (325 lb 3.2 oz), SpO2 92.00%.  Physical Exam: General: Alert and awake, oriented x3, not in any acute distress. CVS: S1-S2 clear, no murmur rubs or gallops Chest: CTAB, no wheezing, rales or rhonchi Abdomen: soft NT, ND, NBS  Extremities: no c/c/e, chronic venous changes bilaterally with ?cellulitis, unable to lift up his right leg  Neuro: unable to lift up his right leg, left leg 5/5 strength   Lab Results: Basic Metabolic Panel:  Recent Labs Lab 09/20/13 0442 09/21/13 0504  NA 149* 147  K 3.4* 3.8  CL 111 109  CO2 26 24  GLUCOSE 102* 119*  BUN 18 17  CREATININE 0.98 0.94  CALCIUM 8.7 8.8  Liver Function Tests:  Recent Labs Lab 09/20/13 0442 09/21/13 0504  AST 42* 32  ALT 29 26  ALKPHOS 51 55  BILITOT 0.9 1.1  PROT 6.0 6.2  ALBUMIN 3.1* 3.1*    CBC:  Recent Labs Lab 09/20/13 0442 09/21/13 0504  WBC 7.8 12.0*  NEUTROABS 4.5 9.3*  HGB 12.6* 12.6*  HCT 39.0 38.6*  MCV 93.3 92.8  PLT 163 188     Micro Results: Recent Results (from the past 240 hour(s))  URINE CULTURE     Status: None   Collection Time    09/18/13   4:32 PM      Result Value Ref Range Status   Specimen Description URINE, CLEAN CATCH   Final   Special Requests NONE   Final   Culture  Setup Time     Final   Value: 09/18/2013 17:21     [facility]formed at [facility]   Colony Count     Final   Value: NO GROWTH     [facility]formed at [facility]   Culture     Final   Value: NO GROWTH     Performed at [facility]   Report Status 09/19/2013 FINAL   Final    Studies/Results: Ct Abdomen Pelvis Wo Contrast  09/18/2013   CLINICAL DATA:  Lower extremity pain and weakness after a fall today. History of abdominal aortic aneurysm.  EXAM: CT ABDOMEN AND PELVIS WITHOUT CONTRAST  TECHNIQUE: Multidetector CT imaging of the abdomen and pelvis was performed following the standard protocol without intravenous contrast.  COMPARISON:  12/30/2009  FINDINGS: Atelectasis in the lung bases.  Calcification in the aortic valve.  Evaluation of solid organs and vascular structures is limited due to the lack of IV contrast material. Surgical absence of the gallbladder. Biliary gas is likely postoperative. Diffuse fatty infiltration of the pancreas. Unenhanced appearance of the spleen, adrenal glands, inferior vena cava, and retroperitoneal lymph nodes is unremarkable. Multiple low-attenuation lesions in both kidneys, largest in the mid lower pole on the right measuring 6 cm diameter. These are consistent with cysts and were previously identified on the prior study. No hydronephrosis in either kidney. Punctate size calcifications in both kidneys consistent with nonobstructing stones. There is calcification of the abdominal aorta with focal dilatation to maximal AP diameter of 4.2 cm. This appears stable since previous study. No abnormal retroperitoneal fluid collections. The stomach, small bowel, and colon are not abnormally distended. No free air or free fluid in the abdomen. Prominent visceral adipose tissues.  Pelvis: There is a bladder stone measuring 2.1 cm  diameter. This is unchanged since previous study. The prostate gland is enlarged, measuring 6.1 x 4.5 cm. No pelvic lymphadenopathy. The appendix is normal. There is a focal fluid or scarring demonstrated in the right lower quadrant but this is adjacent to the cecum and is distant from the appendix. This could represent focal inflammation or nonspecific fluid. No evidence to suggest diverticulitis. Degenerative changes in the lumbar spine. Degenerative changes appear to cause central canal stenosis at at least the L2-3, L3-4, and L4-5 levels. No destructive bone lesions appreciated.  IMPRESSION: Abdominal aortic aneurysm measuring 4.2 cm maximal diameter. Punctate size nonobstructing renal stones bilaterally. Bilateral renal cysts. Fatty infiltration of the pancreas. Normal appendix. Bladder stone. Prostate gland enlargement. Small nonspecific fluid or infiltration demonstrated in the right lower quadrant separate from the normal appendix.   Electronically Signed   By: Burman NievesWilliam  Stevens M.D.   On: 09/18/2013 22:49  Dg Chest 2 View  09/18/2013   CLINICAL DATA:  Weakness  EXAM: CHEST - 2 VIEW  COMPARISON:  DG CHEST 1V PORT dated 05/31/2008  FINDINGS: Borderline cardiomegaly allowing for AP technique. Vascular congestion. Bibasilar atelectasis. No Kerley B-lines. No pneumothorax. No pleural effusion.  IMPRESSION: Bibasilar atelectasis.   Electronically Signed   By: Maryclare Bean M.D.   On: 09/18/2013 18:06   Dg Hip Complete Right  09/19/2013   CLINICAL DATA:  Status post fall 2 days ago.  Right hip pain.  EXAM: RIGHT HIP - COMPLETE 2+ VIEW  COMPARISON:  Plain films right hip 05/28/2008.  FINDINGS: Dynamic hip screw is in place for a healed intertrochanteric fracture. Hardware is intact. There is partial visualization of plate and screws for fixation of a right femur fracture. No acute bony or joint abnormality is identified. The left hip is unremarkable.  IMPRESSION: No acute finding. Status post fixation of right  hip and femur fractures.   Electronically Signed   By: Drusilla Kanner M.D.   On: 09/19/2013 18:13   Dg Femur Right  09/19/2013   CLINICAL DATA:  Status post fall 2 days ago.  Right femoral pain.  EXAM: RIGHT FEMUR - 2 VIEW  COMPARISON:  Plain films of the right femur 05/28/2008.  FINDINGS: No acute bony or joint abnormality is identified. The patient has a healed distal femur fracture with plate and screws in place. The plate is broken but unchanged. Small knee joint effusion is identified.  IMPRESSION: No acute abnormality. Healed distal femur fracture with hardware in place. Stable compared to prior exam.   Electronically Signed   By: Drusilla Kanner M.D.   On: 09/19/2013 18:16   Dg Tibia/fibula Right  09/18/2013   CLINICAL DATA:  Leg pain and swelling  EXAM: RIGHT TIBIA AND FIBULA - 2 VIEW  COMPARISON:  None.  FINDINGS: Four views of the right tibia-fibula submitted. No acute fracture or subluxation. Mild soft tissue swelling.  IMPRESSION: No acute fracture or subluxation.  Diffuse soft tissue swelling.   Electronically Signed   By: Natasha Mead M.D.   On: 09/18/2013 18:16   Mr Brain Wo Contrast  09/18/2013   CLINICAL DATA:  Right lower extremity weakness.  EXAM: MRI HEAD WITHOUT CONTRAST  TECHNIQUE: Multiplanar, multiecho pulse sequences of the brain and surrounding structures were obtained without intravenous contrast.  COMPARISON:  None.  FINDINGS: Mild motion degraded examination. No reduced diffusion to suggest acute ischemia. No susceptibility artifact to suggest hemorrhage. Minimal basal ganglia mineralization.  Moderate to severe ventriculomegaly, likely on the basis of global parenchymal brain volume loss as there is overall commensurate enlargement of cerebellar folia and sulci, though there is sulcal effacement at the convexities. 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 Minimal white matter T2 hyperintensities, less than  expected for age suggest chronic small vessel ischemic disease.  No abnormal extra-axial fluid collections. Normal major intracranial vascular flow voids preserved at the skullbase, however there is mild dolichoectasia of the intracranial vessels which can be related to chronic hypertension.  Status post bilateral ocular lens implants. Trace paranasal sinus mucosal thickening without air-fluid levels. Mastoid air cells are well aerated. Mild temporomandibular osteoarthrosis. No abnormal sellar expansion. No cerebellar tonsillar ectopia.  IMPRESSION: No acute intracranial process, specifically no evidence of acute ischemia on this mildly motion degraded examination.  Sulcal effacement at the convexities may be seen with normal pressure hydrocephalus.  Mild white matter changes likely reflect  chronic small vessel ischemic disease, less than expected for age in addition to multiple remote left cerebellar infarcts.   Electronically Signed   By: Awilda Metro   On: 09/18/2013 23:33   Ct Hip Right Wo Contrast  09/20/2013   CLINICAL DATA:  Right hip pain and limited range of motion.  EXAM: CT OF THE RIGHT HIP WITHOUT CONTRAST  TECHNIQUE: Multidetector CT imaging was performed according to the standard protocol. Multiplanar CT image reconstructions were also generated.  COMPARISON:  Plain films right hip and femur 09/19/2013. CT abdomen and pelvis 09/18/2013.  FINDINGS: No acute fracture is identified. The patient has a healed right intertrochanteric fracture with fixation hardware in place. Hardware is intact. The right hip is located. There is no notable degenerative disease about the right hip. No avascular necrosis of the right femoral head is seen. There is no hip joint effusion. All visualized musculature is intact and normal in appearance. Imaged intrapelvic contents demonstrate urinary bladder stone as seen on the comparison CT.  IMPRESSION: Negative for fracture.  No acute abnormality.  Healed right  intertrochanteric fracture with intact hardware in place.  Urinary bladder stone.   Electronically Signed   By: Drusilla Kanner M.D.   On: 09/20/2013 09:55   US Venous Img Lower Bilateral  09/08/2013   CLINICAL DATA:  Bilateral lower extremity edema; history of previous DVT in the left leg  EXAM: BILATERAL LOWER EXTREMITY VENOUS DOPPLER ULTRASOUND  TECHNIQUE: Gray-scale sonography with graded compression, as well as color Doppler and duplex ultrasound were performed to evaluate the lower extremity deep venous systems from the level of the common femoral vein and including the common femoral, femoral, profunda femoral, popliteal and calf veins including the posterior tibial, peroneal and gastrocnemius veins when visible. The superficial great saphenous vein was also interrogated. Spectral Doppler was utilized to evaluate flow at rest and with distal augmentation maneuvers in the common femoral, femoral and popliteal veins.  COMPARISON:  None.  FINDINGS: RIGHT LOWER EXTREMITY  Common Femoral Vein: No evidence of thrombus. Normal compressibility, respiratory phasicity and response to augmentation.  Saphenofemoral Junction: No evidence of thrombus. Normal compressibility and flow on color Doppler imaging.  Profunda Femoral Vein: No evidence of thrombus. Normal compressibility and flow on color Doppler imaging.  Femoral Vein: No evidence of thrombus. Normal compressibility, respiratory phasicity and response to augmentation.  Popliteal Vein: No evidence of thrombus. Normal compressibility, respiratory phasicity and response to augmentation.  Calf Veins: No evidence of thrombus. Normal compressibility and flow on color Doppler imaging.  Superficial Great Saphenous Vein: No evidence of thrombus. Normal compressibility and flow on color Doppler imaging.  Venous Reflux:  None.  Other Findings: The peroneal vein and anterior tibial vein were not visualized on the right.  LEFT LOWER EXTREMITY  Common Femoral Vein: No  evidence of thrombus. Normal compressibility, respiratory phasicity and response to augmentation.  Saphenofemoral Junction: No evidence of thrombus. Normal compressibility and flow on color Doppler imaging.  Profunda Femoral Vein: No evidence of thrombus. Normal compressibility and flow on color Doppler imaging.  Femoral Vein: There is nonocclusive thrombus within the superficial femoral vein.  Popliteal Vein: There is nonocclusive thrombus within the popliteal vein.  Calf Veins: No evidence of thrombus. Normal compressibility and flow on color Doppler imaging.  Superficial Great Saphenous Vein: No evidence of thrombus. Normal compressibility and flow on color Doppler imaging.  Venous Reflux:  None.  Other Findings: The peroneal and anterior tibial veins were not demonstrated on the left.  IMPRESSION: 1. There is no evidence of deep venous thrombosis in the visualized veins of the right lower extremity. 2. On the left there is likely old thrombus which is nonocclusive in the superficial femoral and popliteal veins. 3. The large amount of edema as well as erythema and ulceration of the legs limits the study somewhat.   Electronically Signed   By: David  [place]   On: 09/08/2013 15:47    Medications: Scheduled Meds: . doxazosin  4 mg Oral QHS  . finasteride  5 mg Oral QHS  . furosemide  40 mg Oral Daily  . sodium chloride  3 mL Intravenous Q12H  . vancomycin  1,500 mg Intravenous Q12H  . vitamin B-12  1,000 mcg Oral Daily  . warfarin  5 mg Oral ONCE-1800  . Warfarin - Pharmacist Dosing Inpatient   Does not apply q1800      LOS: 3 days   Ripudeep Jenna Luo M.D. [facility] 09/21/2013, 10:41 AM Pager: [phone]  If 7PM-7AM, please contact night-coverage [email or web address] Password [email or web address]  **Disclaimer: This note was dictated with voice recognition software. Similar sounding words can inadvertently be transcribed and this note may contain transcription errors which may not have been corrected upon  publication of note.**

## 2013-09-21 NOTE — Progress Notes (Signed)
Aspiration/Injection Procedure Note Joel CeraBryce A Mays [id number] Apr 01, 1932  Procedure: Aspiration Indications: right knee effusion following fall  Procedure Details Consent: Risks of procedure as well as the alternatives and risks of each were explained to the (patient/caregiver).  Consent for procedure obtained.  Local Anesthesia Used:Lidocaine 1% plain; 3mL Amount of Fluid Aspirated: 50mL of bloody aspirate Character of Fluid: bloody  A sterile dressing was applied.  Patient did tolerate procedure well. Estimated blood loss: none due to the aspiration.  Right Knee Hematoma secondary to fall.  Avel Peacerew Nusaybah Ivie, PA-C   Humna Moorehouse 09/21/2013, 12:54 PM

## 2013-09-21 NOTE — Progress Notes (Signed)
Physical Therapy Treatment Patient Details Name: [name] MRN: [id number] DOB: 05/28/1932 Today's Date: 09/21/2013    History of Present Illness This is a 80 y.o. year old male with noted prior history of chronic venous insufficiency , obesity, COPD, AAA, peripheral vascular disease, hx/o LLE DVT presenting with progressive weakness and falls at home with inability to bear weight on RLE since fall. All RLE imaging and back imaging negative    PT Comments    Pt with some increased movement of RLE with continued pain, unable to rate but able to move RLE with assist for all HEP today. Pt attempted standing but still unable to clear buttock or tolerate weight bearing on RLE. Assisted bed to chair via maximove. Will continue to follow and recommend SNF.   Follow Up Recommendations  SNF;Supervision/Assistance - 24 hour     Equipment Recommendations       Recommendations for Other Services       Precautions / Restrictions Precautions Precautions: Fall Precaution Comments: obese    Mobility  Bed Mobility Overal bed mobility: Needs Assistance Bed Mobility: Supine to Sit     Supine to sit: Mod assist;HOB elevated     General bed mobility comments: pt with assist to pivot legs to EOB and elevate trunk with HOB 20degrees, unable to roll with rail and assist of one  Transfers Overall transfer level: Needs assistance     Sit to Stand: Max assist;+2 physical assistance;From elevated surface;+2 safety/equipment         General transfer comment: attempted sit to stand from elevated bed x 2 with feet blocked and use of belt and pad, Pt able to shift weight anteriorly but could not clear buttock. Pt transferred via Maximove from EOB to chair with 2 person assist for safety  Ambulation/Gait                 Stairs            Wheelchair Mobility    Modified Rankin (Stroke Patients Only)       Balance Overall balance assessment: Needs assistance;History of  Falls Sitting-balance support: Bilateral upper extremity supported;Feet supported Sitting balance-Leahy Scale: Fair Sitting balance - Comments: pt able to sit EOB 5 min without posterior lean today                            Cognition Arousal/Alertness: Awake/alert Behavior During Therapy: Flat affect Overall Cognitive Status: Impaired/Different from baseline Area of Impairment: Memory;Safety/judgement;Problem solving;Following commands     Memory: Decreased short-term memory Following Commands: Follows one step commands with increased time Safety/Judgement: Decreased awareness of deficits;Decreased awareness of safety   Problem Solving: Slow processing;Decreased initiation;Difficulty sequencing;Requires verbal cues;Requires tactile cues      Exercises General Exercises - Lower Extremity Long Arc Quad: AAROM;Both;15 reps;Seated Hip ABduction/ADduction: AAROM;Both;15 reps;Seated Hip Flexion/Marching: AROM;AAROM;Right;Left;15 reps;Seated (AAROM on RLE) Toe Raises: AROM;Both;15 reps;Seated Heel Raises: AAROM;AROM;Right;Left;15 reps (AAROM RLE)    General Comments        Pertinent Vitals/Pain     Home Living                      Prior Function            PT Goals (current goals can now be found in the care plan section) Progress towards PT goals: Progressing toward goals (very slowly)    Frequency       PT Plan Current plan remains  appropriate    Co-evaluation             End of Session Equipment Utilized During Treatment: Gait belt Activity Tolerance: Patient tolerated treatment well Patient left: in chair;with call bell/phone within reach;with chair alarm set     Time: [id number] PT Time Calculation (min): 24 min  Charges:  $Therapeutic Exercise: 8-22 mins $Therapeutic Activity: 8-22 mins                    G Codes:      Arnol Mcgibbon B Randal Yepiz 09/21/2013, 9:55 AM Delaney MeigsMaija Tabor Stephene Alegria, PT [phone]

## 2013-09-22 ENCOUNTER — Inpatient Hospital Stay (HOSPITAL_COMMUNITY): Payer: Medicare Other

## 2013-09-22 DIAGNOSIS — R0609 Other forms of dyspnea: Secondary | ICD-10-CM

## 2013-09-22 DIAGNOSIS — J9600 Acute respiratory failure, unspecified whether with hypoxia or hypercapnia: Secondary | ICD-10-CM | POA: Diagnosis present

## 2013-09-22 DIAGNOSIS — I714 Abdominal aortic aneurysm, without rupture, unspecified: Secondary | ICD-10-CM

## 2013-09-22 DIAGNOSIS — M25469 Effusion, unspecified knee: Secondary | ICD-10-CM

## 2013-09-22 DIAGNOSIS — I480 Paroxysmal atrial fibrillation: Secondary | ICD-10-CM | POA: Diagnosis present

## 2013-09-22 DIAGNOSIS — I359 Nonrheumatic aortic valve disorder, unspecified: Secondary | ICD-10-CM

## 2013-09-22 DIAGNOSIS — R0989 Other specified symptoms and signs involving the circulatory and respiratory systems: Secondary | ICD-10-CM

## 2013-09-22 LAB — COMPREHENSIVE METABOLIC PANEL
ALT: 24 U/L (ref 0–53)
AST: 35 U/L (ref 0–37)
Albumin: 3 g/dL — ABNORMAL LOW (ref 3.5–5.2)
Alkaline Phosphatase: 66 U/L (ref 39–117)
BUN: 22 mg/dL (ref 6–23)
CO2: 24 mEq/L (ref 19–32)
CREATININE: 1.03 mg/dL (ref 0.50–1.35)
Calcium: 8.8 mg/dL (ref 8.4–10.5)
Chloride: 108 mEq/L (ref 96–112)
GFR calc Af Amer: 77 mL/min — ABNORMAL LOW (ref >90)
GFR calc non Af Amer: 66 mL/min — ABNORMAL LOW (ref >90)
Glucose, Bld: 114 mg/dL — ABNORMAL HIGH (ref 70–99)
POTASSIUM: 3.6 meq/L — AB (ref 3.7–5.3)
Sodium: 145 mEq/L (ref 137–147)
TOTAL PROTEIN: 6.2 g/dL (ref 6.0–8.3)
Total Bilirubin: 0.9 mg/dL (ref 0.3–1.2)

## 2013-09-22 LAB — TROPONIN I
Troponin I: 0.33 ng/mL (ref <0.30)
Troponin I: <0.3 ng/mL (ref <0.30)
Troponin I: <0.3 ng/mL (ref <0.30)

## 2013-09-22 LAB — INFLUENZA PANEL BY PCR (TYPE A & B)
H1N1FLUPCR: NOT DETECTED
Influenza A By PCR: NEGATIVE
Influenza B By PCR: NEGATIVE

## 2013-09-22 LAB — POCT I-STAT 3, ART BLOOD GAS (G3+)
Bicarbonate: 24.2 mEq/L — ABNORMAL HIGH (ref 20.0–24.0)
O2 Saturation: 93 %
PCO2 ART: 36.6 mmHg (ref 35.0–45.0)
TCO2: 25 mmol/L (ref 0–100)
pH, Arterial: 7.427 (ref 7.350–7.450)
pO2, Arterial: 63 mmHg — ABNORMAL LOW (ref 80.0–100.0)

## 2013-09-22 LAB — CBC WITH DIFFERENTIAL/PLATELET
Basophils Absolute: 0 10*3/uL (ref 0.0–0.1)
Basophils Relative: 0 % (ref 0–1)
Eosinophils Absolute: 0.6 10*3/uL (ref 0.0–0.7)
Eosinophils Relative: 6 % — ABNORMAL HIGH (ref 0–5)
HEMATOCRIT: 37 % — AB (ref 39.0–52.0)
Hemoglobin: 12 g/dL — ABNORMAL LOW (ref 13.0–17.0)
LYMPHS PCT: 12 % (ref 12–46)
Lymphs Abs: 1.2 10*3/uL (ref 0.7–4.0)
MCH: 30.1 pg (ref 26.0–34.0)
MCHC: 32.4 g/dL (ref 30.0–36.0)
MCV: 92.7 fL (ref 78.0–100.0)
MONO ABS: 1.6 10*3/uL — AB (ref 0.1–1.0)
MONOS PCT: 15 % — AB (ref 3–12)
Neutro Abs: 7.1 10*3/uL (ref 1.7–7.7)
Neutrophils Relative %: 67 % (ref 43–77)
Platelets: 208 10*3/uL (ref 150–400)
RBC: 3.99 MIL/uL — ABNORMAL LOW (ref 4.22–5.81)
RDW: 16.3 % — ABNORMAL HIGH (ref 11.5–15.5)
WBC: 10.6 10*3/uL — AB (ref 4.0–10.5)

## 2013-09-22 LAB — PROTIME-INR
INR: 2.42 — ABNORMAL HIGH (ref 0.00–1.49)
Prothrombin Time: 25.5 seconds — ABNORMAL HIGH (ref 11.6–15.2)

## 2013-09-22 LAB — TSH: TSH: 1.96 u[IU]/mL (ref 0.350–4.500)

## 2013-09-22 LAB — PRO B NATRIURETIC PEPTIDE: PRO B NATRI PEPTIDE: 2486 pg/mL — AB (ref 0–450)

## 2013-09-22 LAB — MRSA PCR SCREENING: MRSA by PCR: NEGATIVE

## 2013-09-22 MED ORDER — LEVALBUTEROL HCL 1.25 MG/0.5ML IN NEBU
1.2500 mg | INHALATION_SOLUTION | Freq: Four times a day (QID) | RESPIRATORY_TRACT | Status: DC
  Administered 2013-09-22: 1.25 mg via RESPIRATORY_TRACT
  Filled 2013-09-22 (×4): qty 0.5

## 2013-09-22 MED ORDER — LEVALBUTEROL HCL 1.25 MG/0.5ML IN NEBU
1.2500 mg | INHALATION_SOLUTION | Freq: Three times a day (TID) | RESPIRATORY_TRACT | Status: DC
  Administered 2013-09-23 (×2): 1.25 mg via RESPIRATORY_TRACT
  Filled 2013-09-22 (×4): qty 0.5

## 2013-09-22 MED ORDER — DILTIAZEM HCL ER COATED BEADS 180 MG PO CP24
180.0000 mg | ORAL_CAPSULE | Freq: Every day | ORAL | Status: DC
  Administered 2013-09-23: 180 mg via ORAL
  Filled 2013-09-22: qty 1

## 2013-09-22 MED ORDER — WARFARIN SODIUM 2.5 MG PO TABS
3.7500 mg | ORAL_TABLET | ORAL | Status: AC
  Administered 2013-09-22: 3.75 mg via ORAL
  Filled 2013-09-22: qty 1

## 2013-09-22 MED ORDER — METHYLPREDNISOLONE SODIUM SUCC 125 MG IJ SOLR
125.0000 mg | Freq: Once | INTRAMUSCULAR | Status: AC
  Administered 2013-09-22: 125 mg via INTRAVENOUS
  Filled 2013-09-22 (×2): qty 2

## 2013-09-22 MED ORDER — IPRATROPIUM BROMIDE 0.02 % IN SOLN: 0.5000 mg | Freq: Four times a day (QID) | RESPIRATORY_TRACT | Status: DC | PRN

## 2013-09-22 MED ORDER — PERFLUTREN LIPID MICROSPHERE
INTRAVENOUS | Status: AC
  Filled 2013-09-22: qty 10

## 2013-09-22 MED ORDER — LEVALBUTEROL HCL 0.63 MG/3ML IN NEBU: 0.6300 mg | INHALATION_SOLUTION | RESPIRATORY_TRACT | Status: DC

## 2013-09-22 MED ORDER — VANCOMYCIN HCL 10 G IV SOLR
1500.0000 mg | Freq: Two times a day (BID) | INTRAVENOUS | Status: DC
  Administered 2013-09-22 – 2013-09-25 (×7): 1500 mg via INTRAVENOUS
  Filled 2013-09-22 (×9): qty 1500

## 2013-09-22 MED ORDER — NICOTINE 21 MG/24HR TD PT24
21.0000 mg | MEDICATED_PATCH | Freq: Every day | TRANSDERMAL | Status: DC
  Filled 2013-09-22: qty 1

## 2013-09-22 MED ORDER — METOPROLOL TARTRATE 1 MG/ML IV SOLN
5.0000 mg | Freq: Once | INTRAVENOUS | Status: AC
  Administered 2013-09-22: 5 mg via INTRAVENOUS
  Filled 2013-09-22: qty 5

## 2013-09-22 MED ORDER — FUROSEMIDE 10 MG/ML IJ SOLN
40.0000 mg | Freq: Two times a day (BID) | INTRAMUSCULAR | Status: DC
  Administered 2013-09-22 – 2013-09-25 (×7): 40 mg via INTRAVENOUS
  Filled 2013-09-22 (×9): qty 4

## 2013-09-22 MED ORDER — WARFARIN SODIUM 7.5 MG PO TABS
7.5000 mg | ORAL_TABLET | ORAL | Status: DC
  Administered 2013-09-23: 7.5 mg via ORAL
  Filled 2013-09-22 (×2): qty 1

## 2013-09-22 MED ORDER — DILTIAZEM HCL 25 MG/5ML IV SOLN
10.0000 mg | Freq: Once | INTRAVENOUS | Status: AC
  Administered 2013-09-22: 10 mg via INTRAVENOUS
  Filled 2013-09-22: qty 5

## 2013-09-22 MED ORDER — IPRATROPIUM BROMIDE 0.02 % IN SOLN: 0.5000 mg | Freq: Four times a day (QID) | RESPIRATORY_TRACT | Status: DC

## 2013-09-22 MED ORDER — DIGOXIN 0.25 MG/ML IJ SOLN: 0.5000 mg | Freq: Once | INTRAMUSCULAR | Status: DC

## 2013-09-22 MED ORDER — DILTIAZEM HCL 100 MG IV SOLR
5.0000 mg/h | INTRAVENOUS | Status: DC
  Administered 2013-09-22: 5 mg/h via INTRAVENOUS
  Filled 2013-09-22: qty 100

## 2013-09-22 MED ORDER — DILTIAZEM LOAD VIA INFUSION
10.0000 mg | Freq: Once | INTRAVENOUS | Status: AC
  Administered 2013-09-22: 10 mg via INTRAVENOUS
  Filled 2013-09-22: qty 10

## 2013-09-22 MED ORDER — METHYLPREDNISOLONE SODIUM SUCC 125 MG IJ SOLR
60.0000 mg | Freq: Two times a day (BID) | INTRAMUSCULAR | Status: DC
  Filled 2013-09-22: qty 0.96

## 2013-09-22 MED ORDER — LEVALBUTEROL HCL 0.63 MG/3ML IN NEBU
0.6300 mg | INHALATION_SOLUTION | Freq: Four times a day (QID) | RESPIRATORY_TRACT | Status: DC
  Administered 2013-09-22: 0.63 mg via RESPIRATORY_TRACT

## 2013-09-22 MED ORDER — IPRATROPIUM BROMIDE 0.02 % IN SOLN: 0.5000 mg | RESPIRATORY_TRACT | Status: DC

## 2013-09-22 MED ORDER — PIPERACILLIN-TAZOBACTAM 3.375 G IVPB
3.3750 g | Freq: Three times a day (TID) | INTRAVENOUS | Status: DC
  Administered 2013-09-22 – 2013-09-25 (×10): 3.375 g via INTRAVENOUS
  Filled 2013-09-22 (×14): qty 50

## 2013-09-22 MED ORDER — LEVALBUTEROL HCL 0.63 MG/3ML IN NEBU
0.6300 mg | INHALATION_SOLUTION | Freq: Four times a day (QID) | RESPIRATORY_TRACT | Status: DC | PRN
  Filled 2013-09-22 (×2): qty 3

## 2013-09-22 MED ORDER — PERFLUTREN LIPID MICROSPHERE
1.0000 mL | INTRAVENOUS | Status: AC | PRN
  Administered 2013-09-22: 2 mL via INTRAVENOUS
  Filled 2013-09-22: qty 10

## 2013-09-22 NOTE — Progress Notes (Signed)
Transferred -in from 3 west by bed, awake and alert.

## 2013-09-22 NOTE — Progress Notes (Signed)
PT Cancellation Note  Patient Details Name: Waymond CeraBryce A Sun MRN: [id number] DOB: 25-Jul-1931   Cancelled Treatment:    Reason Eval/Treat Not Completed: Medical issues which prohibited therapy (pt in rapid Afib with rate at resting 145 and not medically appropriate at this time)   Abigail Teall B Hira Trent 09/22/2013, 8:05 AM Delaney MeigsMaija Tabor Wonda Goodgame, PT [phone]

## 2013-09-22 NOTE — Progress Notes (Addendum)
Pt does not want to be waken for breathing txs doesn't feel like he needs them at all,He initially refused them all but after I talked to him about why they were needed he relented and took the first tx but was addiment about not wanting to be awakened at night. Changed tx to TID,there is a PRN order if pt should need a tx at night.

## 2013-09-22 NOTE — Progress Notes (Signed)
Spoke with pt's daughter, informed her [facility] unable to offer a bed but provided bed offers again. Pt's daughter states they are leaning toward [facility] and she plans to tour the facilities and update CSW. CSW sent message to [facility] informing them family interested in a private room if possible/adding pt to waiting list for private room if they do this.   Maryclare LabradorJulie Harini Dearmond, MSW, [facility]WA Clinical Social Worker [phone]

## 2013-09-22 NOTE — Consult Note (Signed)
PULMONARY / CRITICAL CARE MEDICINE   Name: [name] Etzler MRN: [id number] DOB: 01-21-1932    ADMISSION DATE:  09/18/2013 CONSULTATION DATE:  4/24  REFERRING MD :  Triad PRIMARY SERVICE:Triad  CHIEF COMPLAINT:  SOB  BRIEF PATIENT DESCRIPTION:  80 yo former smoker who changed to snuff 2 years ago and has Mays host of medical problems and [facility] with complaints of falls, weakness and general failure to thrive. Past medical problems of PE on coumadin, COPD (not followed by pulmonary),AAA repair, PVD with chronic venous statis, LE DVT, he presented with INR1.92.  ON 4/24 he developed increased work of breathing, atrial fibrillation with rvr and as transferred to ICU and PCCM asked to evaluate. He was not in acute resp distress but did appear volume overloaded and had new onset of Afib with RVR. Note he had his right knee aspirated per ortho(Alusio) this admit. PCCM will help manage his care with cardiology input. Check 2 d echo and diuresis as tolerated.  SIGNIFICANT EVENTS / STUDIES:  4/25 tx to icu afib rvr  LINES / TUBES:   CULTURES:   ANTIBIOTICS: 4/24 vanc>> 4/24 pip-tazo>>  HISTORY OF PRESENT ILLNESS:   80 yo former smoker who changed to snuff 11 years ago and has Mays host of medical problems and [facility] with complaints of falls, weakness and general failure to thrive. Past medical problems of PE on coumadin, COPD (not followed by pulmonary),AAA repair, PVD with chronic venous statis, LE DVT, he presented with INR1.92.  ON 4/24 he developed increased work of breathing, atrial fibrillation with rvr and as transferred to ICU and PCCM asked to evaluate. He was not in acute resp distress but did appear volume overloaded and had new onset of Afib with RVR. Note he had his right knee aspirated per ortho(Alusio) this admit. PCCM will help manage his care with cardiology input. Check 2 d echo and diuresis as tolerated.  PAST MEDICAL HISTORY :  Past Medical  History  Diagnosis Date  . Venous insufficiency   . AAA (abdominal aortic aneurysm)   . Ulcer   . DVT (deep venous thrombosis) 05/04/2013    "LLE"  . Cellulitis   . Nicotine dependence   . Bronchospasm   . Elevated PSA   . BPH (benign prostatic hyperplasia)   . COPD (chronic obstructive pulmonary disease)   . Cognitive impairment   . Daily headache   . Arthritis     "probably on the right leg/hip" (09/20/2013)  . Dementia     "don't know exactly what kind" (09/20/2013)  . Kidney stones   . Glaucoma, both eyes    Past Surgical History  Procedure Laterality Date  . Cataract extraction w/ intraocular lens  implant, bilateral Bilateral   . Insitu percutaneous pinning femur Right 1986  . Hip fracture surgery Right 2009  . Back surgery    . Posterior laminectomy / decompression lumbar spine  2004    Decompressive laminectomy unilaterally on the right at L4-5 with  . Cholecystectomy  2011  . Femur fracture surgery Right 04/1985  . Cystoscopy w/ stone manipulation  1970's    "once"   Prior to Admission medications   Medication Sig Start Date End Date Taking? Authorizing Provider  doxazosin (CARDURA) 8 MG tablet Take 0.5 tablets (4 mg total) by mouth at bedtime. 09/15/13  Yes Ileana LaddFrancis P Wong, MD  finasteride (PROSCAR) 5 MG tablet Take 5 mg by mouth at bedtime.    Yes  Historical Provider, MD  latanoprost (XALATAN) 0.005 % ophthalmic solution Place 1 drop into both eyes at bedtime.  01/25/12  Yes Historical Provider, MD  torsemide (DEMADEX) 10 MG tablet Take 1 tablet (10 mg total) by mouth daily. 08/15/13  Yes Ileana Ladd, MD  warfarin (COUMADIN) 7.5 MG tablet Take 3.75-7.5 mg by mouth daily at 6 PM. Take 1/2(3.75) tablet on Monday Wednesday Friday and  7.5mg  Sunday Tuesday Thursday Saturday 09/08/13  Yes William J Oxford, FNP   Allergies  Allergen Reactions  . Oxycodone Hcl     REACTION: makes pt "wild"  . Propoxyphene N-Acetaminophen     REACTION: Makes pt. "wild"    FAMILY  HISTORY:  Family History  Problem Relation Age of Onset  . Aneurysm Brother   . Heart attack Brother     10-29-12  . Cancer Father     colon   SOCIAL HISTORY:  reports that he quit smoking about 11 years ago. His smoking use included Cigarettes. He has Mays 108 pack-year smoking history. His smokeless tobacco use includes Snuff. He reports that he does not drink alcohol or use illicit drugs.  REVIEW OF SYSTEMS:   10 point review of system taken, please see HPI for positives and negatives.   SUBJECTIVE:   VITAL SIGNS: Temp:  [98.7 F (37.1 C)-99.6 F (37.6 C)] 99.2 F (37.3 C) (04/24 0529) Pulse Rate:  [68-150] 136 (04/24 1000) Resp:  [16-27] 22 (04/24 1100) BP: (92-172)/(41-86) 120/75 mmHg (04/24 1100) SpO2:  [90 %-98 %] 93 % (04/24 1100) HEMODYNAMICS:   VENTILATOR SETTINGS:   INTAKE / OUTPUT: Intake/Output     04 /23 0701 - 04/24 0700 04/24 0701 - 04/25 0700   P.O. 840    I.V. (mL/kg)  30 (0.2)   IV Piggyback  500   Total Intake(mL/kg) 840 (5.7) 530 (3.6)   Urine (mL/kg/hr) 550 (0.2)    Total Output 550     Net +290 +530          PHYSICAL EXAMINATION: General:  Obese wm, NAD at rest Neuro:  Awake and talkative. MAEx 4 to commands HEENT: No JVD/LAN/cartoid bruit Cardiovascular:  HSIR Afib RVR 134 Lungs:  Diminished, mild rhonchi, basilar crackles Abdomen: obese +bs Musculoskeletal:  Rt knee with dressing from tap Skin:  Chronic lower ext edema and statis. Multiple areas of ecchymosis.   LABS:  CBC  Recent Labs Lab 09/20/13 0442 09/21/13 0504 09/22/13 0535  WBC 7.8 12.0* 10.6*  HGB 12.6* 12.6* 12.0*  HCT 39.0 38.6* 37.0*  PLT 163 188 208   Coag's  Recent Labs Lab 09/20/13 0442 09/21/13 0504 09/22/13 0535  INR 1.91* 2.31* 2.42*   BMET  Recent Labs Lab 09/20/13 0442 09/21/13 0504 09/22/13 0535  NA 149* 147 145  K 3.4* 3.8 3.6*  CL 111 109 108  CO2 26 24 24   BUN 18 17 22   CREATININE 0.98 0.94 1.03  GLUCOSE 102* 119* 114*    Electrolytes  Recent Labs Lab 09/20/13 0442 09/21/13 0504 09/22/13 0535  CALCIUM 8.7 8.8 8.8   Sepsis Markers No results found for this basename: LATICACIDVEN, PROCALCITON, O2SATVEN,  in the last 168 hours ABG  Recent Labs Lab 09/22/13 1008  PHART 7.427  PCO2ART 36.6  PO2ART 63.0*   Liver Enzymes  Recent Labs Lab 09/20/13 0442 09/21/13 0504 09/22/13 0535  AST 42* 32 35  ALT 29 26 24   ALKPHOS 51 55 66  BILITOT 0.9 1.1 0.9  ALBUMIN 3.1* 3.1* 3.0*   Cardiac Enzymes  Recent Labs Lab 09/18/13 1958 09/22/13 0910  TROPONINI  --  <0.30  PROBNP 245.5  --    Glucose No results found for this basename: GLUCAP,  in the last 168 hours  Imaging Ct Lumbar Spine Wo Contrast  09/20/2013   CLINICAL DATA:  History of lumbar spine surgery. Right leg weakness. Rule out spinal stenosis.  EXAM: CT LUMBAR SPINE WITHOUT CONTRAST  TECHNIQUE: Multidetector CT imaging of the lumbar spine was performed without intravenous contrast administration. Multiplanar CT image reconstructions were also generated.  COMPARISON:  None.  FINDINGS: Some lumbar CT images were retrospectively acquired from Mays CT abdomen pelvis on 09/18/2013.  Atherosclerotic aorta with the infrarenal abdominal aortic aneurysm measuring up to 44 x 36 mm. Large bladder stone measures 19 mm.  Negative for lumbar spine fracture or mass. No acute bony change. The lumbar alignment is normal.  T12-L1:  Mild disc and facet degeneration with mild spinal stenosis  L1-2: Disc bulging and facet degeneration with moderate spinal stenosis.  L2-3: Disc desiccation and vacuum disc phenomenon. Disc bulging and spondylosis. Bilateral facet hypertrophy. Moderate to severe spinal stenosis. Neural foraminal encroachment bilaterally  L3-4: Diffuse disc bulging. Bilateral facet and ligamentum flavum hypertrophy. Severe spinal stenosis. Marked lateral recess and foraminal encroachment bilaterally.  L4-5: Laminotomy on the right. Disc degeneration and  spondylosis. Bilateral facet hypertrophy. There is foraminal encroachment bilaterally. Spinal canal is adequate in size.  L5-S1: Spondylosis and facet degeneration causing foraminal narrowing bilaterally.  IMPRESSION: Negative for lumbar fracture or mass lesion.  Multilevel disc and facet degeneration. Multilevel spinal stenosis. Foraminal encroachment is present bilaterally at multiple levels due to spurring.  Abdominal aortic aneurysm measuring 44 x 36 mm.  Large bladder stone.   Electronically Signed   By: Marlan Palau M.D.   On: 09/20/2013 18:35   Dg Chest Port 1 View  09/22/2013   CLINICAL DATA:  Bibasilar atelectasis on previous chest x-ray.  EXAM: PORTABLE CHEST - 1 VIEW  COMPARISON:  Two-view chest x-ray 09/18/2013.  FINDINGS: The heart is enlarged. Mild interstitial edema has increased. Mild bibasilar airspace disease is similar to the prior exam, likely representing atelectasis or scarring. No significant airspace consolidation is present.  IMPRESSION: 1. Cardiomegaly and increasing interstitial edema suggesting congestive heart failure. 2. Bibasilar airspace disease likely are presents atelectasis or scarring.   Electronically Signed   By: Gennette Pac M.D.   On: 09/22/2013 08:30       ASSESSMENT / PLAN:  PULMONARY Mays: COPD, not wheezing     Hx of PE, ro acute MOst impressed for edema as cause, secondary fib rvr P:   O2 as needed DC steroids Xopenex if BD needed No need for intubation at this time. No roel NIMV at this stage Neg balance with lasix Echo now, rv does not appear as acute pe as cause, much more impressed with edema pcxr in am  control rvr  CARDIOVASCULAR Mays: New onset Mays fib RVR      DVT, r/o PE ( has h/o and inr less 2)      P:  Dilt per cards ICU admit 2 d echo, reviewed, assessed rv May need DCCV Diuresis as tolerated Anticoagulation is therapeutic Control HR lasix  RENAL Lab Results  Component Value Date   CREATININE 1.03 09/22/2013   CREATININE  0.94 09/21/2013   CREATININE 0.98 09/20/2013   CREATININE 1.26 10/06/2012    Mays:  No acute issue P:   Monoitor lytes/creatine with diuresis  GASTROINTESTINAL Mays:  GI  protection P:   Consider clears PPI  HEMATOLOGIC Mays: Chronic coumadin therapy P:  Monitor INR in am further Cbc in am   INFECTIOUS Mays:  R/o pna, likely mostly edema, knee P:   On v/z, note recent knee tap  ENDOCRINE CBG (last 3)   Mays:  No acute issue P:   Check TSH due to afib  NEUROLOGIC Mays:  Chronic dementia P:   Monitor mental status  TODAY'S SUMMARY:   80 yo former smoker who changed to snuff 2 years ago and has Mays host of medical problems and [facility] with complaints of falls, weakness and general failure to thrive. Past medical problems of PE on coumadin, COPD (not followed by pulmonary),AAA repair, PVD with chronic venous statis, LE DVT, he presented with INR1.92.  ON 4/24 he developed increased work of breathing, atrial fibrillation with rvr and as transferred to ICU and PCCM asked to evaluate. He was not in acute resp distress but did appear volume overloaded and had new onset of Afib with RVR. Note he had his right knee aspirated per ortho(Alusio) this admit. PCCM will help manage his care with cardiology input. Check 2 d echo and diuresis as tolerated. Control rate family updated  I have personally obtained Mays history, examined the patient, evaluated laboratory and imaging results, formulated the assessment and plan and placed orders. CRITICAL CARE: The patient is critically ill with multiple organ systems failure and requires high complexity decision making for assessment and support, frequent evaluation and titration of therapies, application of advanced monitoring technologies and extensive interpretation of multiple databases. Critical Care Time devoted to patient care services described in this note is   30 minutes.   Mcarthur Rossettianiel J. Tyson AliasFeinstein, MD, FACP Pgr: [phone] [facility]  Pulmonary and Critical Care Medicine [facility] Pager: [phone]  09/22/2013, 11:18 AM

## 2013-09-22 NOTE — Progress Notes (Signed)
Upon assessment this am, Pt went into rapid afib, hr 150s. Dr. Isidoro Donningai on floor and immediately notified.Bp 121/59. Pt has a productive but I can hear crackles in all lobes and exp wheezes. Pt is short of breath. O2 sats in the 90s on room air. Orders received and followed through.Pt to trasfer to stepdown, report given to Chat. Pt transferred to 2h16.with rapid response RN. Pts daughter Alona BeneJoyce updated and informed of new location and was satisfied.

## 2013-09-22 NOTE — Progress Notes (Signed)
Converted to sinus rhythm with Pac's, as confirmed by ekg. Cardiologist made aware.

## 2013-09-22 NOTE — Progress Notes (Signed)
CSW called pt's daughter and left voicemail introducing self and explaining CS[facility] (requested by family to pt's previous CSW) and that [facility], [facility], and [facility] . Requested daughter call CSW back to further discuss discharge planning.   Maryclare LabradorJulie Ajani Rineer, MSW, [facility] Clinical Social Worker [phone]

## 2013-09-22 NOTE — Consult Note (Signed)
CONSULT NOTE  Date: 09/22/2013               Patient Name:  [name] MRN: [id number]  DOB: 09-28-1931 Age / Sex: 80 y.o., male        PCP: Redmond Baseman Primary Cardiologist: New / Mahli Glahn            Referring Physician: Rai              Reason for Consult: Rapid afib           History of Present Illness: Patient is a 80 y.o. male with a PMHx of dementia, chronic venous insufficiency, obesity, COPD, abdominal aortic aneurysm, peripheral vascular disease, history of deep vein thrombosis - currently on Coumadin therapy, who was admitted to [facility] on 09/18/2013 for evaluation of gradual weakness and history of recent falls.  This morning [name] was noted to be in rapid atrial fibrillation. We are asked to see him for further evaluation.  He's very pleasantly demented and is not able to really provide any significant history. He has been feeling poorly for the past 3 days. He does not know she's had a fever. He's had progressive shortness of breath. He denies any chest pain.   Medications: Outpatient medications: Prescriptions prior to admission  Medication Sig Dispense Refill  . doxazosin (CARDURA) 8 MG tablet Take 0.5 tablets (4 mg total) by mouth at bedtime.      . finasteride (PROSCAR) 5 MG tablet Take 5 mg by mouth at bedtime.       [name] latanoprost (XALATAN) 0.005 % ophthalmic solution Place 1 drop into both eyes at bedtime.       . torsemide (DEMADEX) 10 MG tablet Take 1 tablet (10 mg total) by mouth daily.  90 tablet  1  . warfarin (COUMADIN) 7.5 MG tablet Take 3.75-7.5 mg by mouth daily at 6 PM. Take 1/2(3.75) tablet on Monday Wednesday Friday and  7.5mg  Sunday Tuesday Thursday Saturday        Current medications: Current Facility-Administered Medications  Medication Dose Route Frequency Provider Last Rate Last Dose  . acetaminophen (TYLENOL) tablet 650 mg  650 mg Oral Q4H PRN Ripudeep Jenna Luo, MD   650 mg at 09/21/13 0043  . chlorpheniramine-HYDROcodone  (TUSSIONEX) 10-8 MG/5ML suspension 5 mL  5 mL Oral Q12H PRN Roma Kayser Schorr, NP   5 mL at 09/21/13 2230  . digoxin (LANOXIN) 0.25 MG/ML injection 0.5 mg  0.5 mg Intravenous Once Ripudeep K Rai, MD      . diltiazem (CARDIZEM) 1 mg/mL load via infusion 10 mg  10 mg Intravenous Once Ripudeep K Rai, MD      . diltiazem (CARDIZEM) 100 mg in dextrose 5 % 100 mL infusion  5-15 mg/hr Intravenous Continuous Ripudeep K Rai, MD      . doxazosin (CARDURA) tablet 4 mg  4 mg Oral QHS Doree Albee, MD   4 mg at 09/21/13 2230  . finasteride (PROSCAR) tablet 5 mg  5 mg Oral QHS Doree Albee, MD   5 mg at 09/21/13 2230  . furosemide (LASIX) tablet 40 mg  40 mg Oral Daily Belkys A Regalado, MD   40 mg at 09/21/13 1058  . levalbuterol (XOPENEX) nebulizer solution 0.63 mg  0.63 mg Nebulization Q6H Ripudeep K Rai, MD       And  . ipratropium (ATROVENT) nebulizer solution 0.5 mg  0.5 mg Nebulization Q6H Ripudeep Jenna Luo, MD      .  latanoprost (XALATAN) 0.005 % ophthalmic solution 1 drop  1 drop Both Eyes QHS Ripudeep K Rai, MD   1 drop at 09/21/13 2237  . levalbuterol (XOPENEX) nebulizer solution 0.63 mg  0.63 mg Nebulization Q6H PRN Ripudeep K Rai, MD      . lidocaine (XYLOCAINE) 1 % (with pres) injection 10 mL  10 mL Intradermal Once Alexzandrew Perkins, PA-C      . sodium chloride 0.9 % injection 3 mL  3 mL Intravenous Q12H Doree AlbeeSteven Newton, MD   3 mL at 09/21/13 2239  . vancomycin (VANCOCIN) 1,250 mg in sodium chloride 0.9 % 250 mL IVPB  1,250 mg Intravenous Q12H Anh P Pham, RPH   1,250 mg at 09/21/13 2231  . vitamin B-12 (CYANOCOBALAMIN) tablet 1,000 mcg  1,000 mcg Oral Daily Ripudeep Jenna LuoK Rai, MD   1,000 mcg at 09/21/13 1058  . Warfarin - Pharmacist Dosing Inpatient   Does not apply q1800 [facility] ClinicJennifer Danielle [place], [facility] HospitalRPH         Allergies  Allergen Reactions  . Oxycodone Hcl     REACTION: makes pt "wild"  . Propoxyphene N-Acetaminophen     REACTION: Makes pt. "wild"     Past Medical History  Diagnosis Date  .  Venous insufficiency   . AAA (abdominal aortic aneurysm)   . Ulcer   . DVT (deep venous thrombosis) 05/04/2013    "LLE"  . Cellulitis   . Nicotine dependence   . Bronchospasm   . Elevated PSA   . BPH (benign prostatic hyperplasia)   . COPD (chronic obstructive pulmonary disease)   . Cognitive impairment   . Daily headache   . Arthritis     "probably on the right leg/hip" (09/20/2013)  . Dementia     "don't know exactly what kind" (09/20/2013)  . Kidney stones   . Glaucoma, both eyes     Past Surgical History  Procedure Laterality Date  . Cataract extraction w/ intraocular lens  implant, bilateral Bilateral   . Insitu percutaneous pinning femur Right 1986  . Hip fracture surgery Right 2009  . Back surgery    . Posterior laminectomy / decompression lumbar spine  2004    Decompressive laminectomy unilaterally on the right at L4-5 with  . Cholecystectomy  2011  . Femur fracture surgery Right 04/1985  . Cystoscopy w/ stone manipulation  1970's    "once"    Family History  Problem Relation Age of Onset  . Aneurysm Brother   . Heart attack Brother     10-29-12  . Cancer Father     colon    Social History:  reports that he quit smoking about 11 years ago. His smoking use included Cigarettes. He has a 108 pack-year smoking history. His smokeless tobacco use includes Snuff. He reports that he does not drink alcohol or use illicit drugs.   Review of Systems: Constitutional:  denies fever, chills, diaphoresis, appetite change and fatigue.  HEENT: denies photophobia, eye pain, redness, hearing loss, ear pain, congestion, sore throat, rhinorrhea, sneezing, neck pain, neck stiffness and tinnitus.  Respiratory: admits to SOB, DOE, cough, chest tightness, and wheezing.  Cardiovascular: denies chest pain, palpitations and leg swelling.  Gastrointestinal: denies nausea, vomiting, abdominal pain, diarrhea, constipation, blood in stool.  Genitourinary: denies dysuria, urgency, frequency,  hematuria, flank pain and difficulty urinating.  Musculoskeletal: denies  myalgias, back pain, joint swelling, arthralgias and gait problem.   Skin: admits to chronic stasis changes  Neurological: denies dizziness, seizures, syncope, weakness, light-headedness, numbness  and headaches.   Hematological: denies adenopathy, easy bruising, personal or family bleeding history.  Psychiatric/ Behavioral: denies suicidal ideation, mood changes, confusion, nervousness, sleep disturbance and agitation.    Physical Exam: BP 172/77  Pulse 95  Temp(Src) 99.2 F (37.3 C) (Oral)  Resp 20  Ht 6\' 8"  (2.032 m)  Wt 325 lb 3.2 oz (147.51 kg)  BMI 35.73 kg/m2  SpO2 94%  Wt Readings from Last 3 Encounters:  09/21/13 325 lb 3.2 oz (147.51 kg)  05/31/13 327 lb (148.326 kg)  05/22/13 325 lb (147.419 kg)    General: Vital signs reviewed and noted. He is in moderate respiratory distress. He feels warm to the touch.   Head: Normocephalic, atraumatic, sclera anicteric,   Neck: Supple. Negative for carotid bruits. No JVD   Lungs:  Bilateral wheezing, poor inspiratory effort.   Heart: Irreg.  Irreg.  tachycardic  Abdomen:  Soft, non-tender, non-distended with normoactive bowel sounds. No hepatomegaly. No rebound/guarding. No obvious abdominal masses   MSK: Generally weak, unable to sit up on his own.   Extremities: 2+ leg edema, significant chronic stasis changes in legs, scaley skin  Neurologic: Alert , ? Oriented,  Unable to answer many specific questions  Psych: Pleasant, unable to tell me who his doctor is ,      Lab results: Basic Metabolic Panel:  Recent Labs Lab 09/20/13 0442 09/21/13 0504 09/22/13 0535  NA 149* 147 145  K 3.4* 3.8 3.6*  CL 111 109 108  CO2 26 24 24   GLUCOSE 102* 119* 114*  BUN 18 17 22   CREATININE 0.98 0.94 1.03  CALCIUM 8.7 8.8 8.8    Liver Function Tests:  Recent Labs Lab 09/20/13 0442 09/21/13 0504 09/22/13 0535  AST 42* 32 35  ALT 29 26 24   ALKPHOS 51 55  66  BILITOT 0.9 1.1 0.9  PROT 6.0 6.2 6.2  ALBUMIN 3.1* 3.1* 3.0*   No results found for this basename: LIPASE, AMYLASE,  in the last 168 hours No results found for this basename: AMMONIA,  in the last 168 hours  CBC:  Recent Labs Lab 09/18/13 1602 09/19/13 0531 09/20/13 0442 09/21/13 0504 09/22/13 0535  WBC 8.0 8.0 7.8 12.0* 10.6*  NEUTROABS 5.8 4.7 4.5 9.3* 7.1  HGB 13.2 12.7* 12.6* 12.6* 12.0*  HCT 40.8 40.1 39.0 38.6* 37.0*  MCV 92.7 93.3 93.3 92.8 92.7  PLT 200 177 163 188 208    Cardiac Enzymes: No results found for this basename: CKTOTAL, CKMB, CKMBINDEX, TROPONINI,  in the last 168 hours  BNP: No components found with this basename: POCBNP,   CBG: No results found for this basename: GLUCAP,  in the last 168 hours  Coagulation Studies:  Recent Labs  09/20/13 0442 09/21/13 0504 09/22/13 0535  LABPROT 21.3* 24.6* 25.5*  INR 1.91* 2.31* 2.42*     Other results:  EKG :  Rapid atrial fib.     Imaging: Ct Lumbar Spine Wo Contrast  09/20/2013   CLINICAL DATA:  History of lumbar spine surgery. Right leg weakness. Rule out spinal stenosis.  EXAM: CT LUMBAR SPINE WITHOUT CONTRAST  TECHNIQUE: Multidetector CT imaging of the lumbar spine was performed without intravenous contrast administration. Multiplanar CT image reconstructions were also generated.  COMPARISON:  None.  FINDINGS: Some lumbar CT images were retrospectively acquired from a CT abdomen pelvis on 09/18/2013.  Atherosclerotic aorta with the infrarenal abdominal aortic aneurysm measuring up to 44 x 36 mm. Large bladder stone measures 19 mm.  Negative for lumbar  spine fracture or mass. No acute bony change. The lumbar alignment is normal.  T12-L1:  Mild disc and facet degeneration with mild spinal stenosis  L1-2: Disc bulging and facet degeneration with moderate spinal stenosis.  L2-3: Disc desiccation and vacuum disc phenomenon. Disc bulging and spondylosis. Bilateral facet hypertrophy. Moderate to severe  spinal stenosis. Neural foraminal encroachment bilaterally  L3-4: Diffuse disc bulging. Bilateral facet and ligamentum flavum hypertrophy. Severe spinal stenosis. Marked lateral recess and foraminal encroachment bilaterally.  L4-5: Laminotomy on the right. Disc degeneration and spondylosis. Bilateral facet hypertrophy. There is foraminal encroachment bilaterally. Spinal canal is adequate in size.  L5-S1: Spondylosis and facet degeneration causing foraminal narrowing bilaterally.  IMPRESSION: Negative for lumbar fracture or mass lesion.  Multilevel disc and facet degeneration. Multilevel spinal stenosis. Foraminal encroachment is present bilaterally at multiple levels due to spurring.  Abdominal aortic aneurysm measuring 44 x 36 mm.  Large bladder stone.   Electronically Signed   By: Marlan Palau M.D.   On: 09/20/2013 18:35   Ct Hip Right Wo Contrast  09/20/2013   CLINICAL DATA:  Right hip pain and limited range of motion.  EXAM: CT OF THE RIGHT HIP WITHOUT CONTRAST  TECHNIQUE: Multidetector CT imaging was performed according to the standard protocol. Multiplanar CT image reconstructions were also generated.  COMPARISON:  Plain films right hip and femur 09/19/2013. CT abdomen and pelvis 09/18/2013.  FINDINGS: No acute fracture is identified. The patient has a healed right intertrochanteric fracture with fixation hardware in place. Hardware is intact. The right hip is located. There is no notable degenerative disease about the right hip. No avascular necrosis of the right femoral head is seen. There is no hip joint effusion. All visualized musculature is intact and normal in appearance. Imaged intrapelvic contents demonstrate urinary bladder stone as seen on the comparison CT.  IMPRESSION: Negative for fracture.  No acute abnormality.  Healed right intertrochanteric fracture with intact hardware in place.  Urinary bladder stone.   Electronically Signed   By: Drusilla Kanner M.D.   On: 09/20/2013 09:55   Dg  Chest Port 1 View  09/22/2013   CLINICAL DATA:  Bibasilar atelectasis on previous chest x-ray.  EXAM: PORTABLE CHEST - 1 VIEW  COMPARISON:  Two-view chest x-ray 09/18/2013.  FINDINGS: The heart is enlarged. Mild interstitial edema has increased. Mild bibasilar airspace disease is similar to the prior exam, likely representing atelectasis or scarring. No significant airspace consolidation is present.  IMPRESSION: 1. Cardiomegaly and increasing interstitial edema suggesting congestive heart failure. 2. Bibasilar airspace disease likely are presents atelectasis or scarring.   Electronically Signed   By: Gennette Pac M.D.   On: 09/22/2013 08:30      Assessment & Plan:  1. Rapid atrial fibrillation:  We were consulted for further evaluation and management of his rapid atrial fibrillation. The patient is having moderate to severe respiratory distress. He has tight bilateral wheezing. He also is fairly warm. I suspect that he may have a pneumonia and may have the flu.  He has a history of COPD. At this point I think it we need to transfer him to the step down unit. I'll give him Xopenex inhalers. He started him start on diltiazem and has received some metoprolol. Will get an echocardiogram for further assessment of his left ventricular function.  Given his respiratory distress, do not think that urgent cardioversion is indicated. I do not think that he'll maintain sinus rhythm.  He is already on Coumadin for history of  DVT. His INR is therapeutic.   2. AAA:  Following   3. DVT:  Continue coumadin       Alvia GrovePhilip J. Yelina Sarratt, Jr., MD, [facility] Northeast HoustonFACC 09/22/2013, 8:59 AM Office - [phone] Pager [phone]

## 2013-09-22 NOTE — Progress Notes (Signed)
Echocardiogram 2D Echocardiogram with Definity has been performed.  Liam Grahamaleba A Christeena Krogh 09/22/2013, 12:28 PM

## 2013-09-22 NOTE — Progress Notes (Signed)
Patient ID: [name]  male  ZOX:[id number]    DOB: 07-01-1931    DOA: 09/18/2013  PCP: Redmond Baseman, MD  Assessment/Plan: Principal Problem:   Acute respiratory failure/acute respiratory distress: ? Aspiration versus COPD exacerbation versus volume overload. - Stat chest x-ray obtained showed a cardiomegaly with increasing interstitial edema suggesting CHF, by basilar airspace disease - Placed on scheduled bronchodilators with Xopenex, Atrovent, Solu-Medrol 125 mg x1, then continue 60mg  every 12 hours, patient was already on vancomycin, added Zosyn, flutter valve, obtain ABG - Placed on IV Lasix for diuresis  Active Problems: Atrial fibrillation with RVR: Possibly could have triggered due to #1 - New onset atrial fibrillation, with no prior history, confirmed with patient's daughter - Given Cardizem 10 mg x1, Lopressor 5 mg x1 with no significant improvement in the heart rate. Started on Cardizem drip, also gave digoxin 0.5 mg x1. Discussed with cardiology for consult.  - Patient is currently on Coumadin with therapeutic INR of 2.4, hence PE unlikely - TSH 2.4, obtain serial cardiac enzymes, ABG - continue IV Lasix, obtain 2-D echo   Weakness, frequent fall;  - PT, OT consult. Recommending skilled nursing facility  - MRI negative for acute stroke, Sulcal effacement at the convexities may be seen with normal pressure hydrocephalus. Patient was seen by neurology, Dr. Amada Jupiter, recommended outpatient neurology workup for neuropathy and dementia. Per neurology, would not evaluate acutely for NPH given knee injury and anticoagulation  - TSH at 2.4., B12 level low    B12 deficiency  - Started on B12 replacement   Bilateral Lower extremities swelling, redness. Patient already on coumadin which cover for DVT. Component of venous insufficiency.  - Continue IV vancomycin for cellulitis   Small nonspecific fluid or infiltration demonstrated in the right lower quadrant separate from  the normal appendix : patient with no abdominal pain, Review scan with radiologist, fluid is non specific.   Right leg pain; and unable to even lift up the right leg, had frequent falls, 2 falls in last week with progressive weakness, history of lumbar spine surgery for disc many years ago - patient actually feels better from his leg and knee pain standpoint today - MRI brain negative for ac CVA  - hip x ray and femur x ray negative for fracture, CT right hip negative - CT of the lumbar spine shows multilevel disc and possible degeneration, multilevel spinal stenosis, foramina encroachment bilateral at multiple levels due to spurring - Appreciate orthopedics evaluation  -Hypernatremia: mild, improving  -History of DVT; continue with coumadin.   DVT Prophylaxis: On Coumadin  Code Status: Full code  Family Communication: Discussed with patient's daughter twice over the phone this morning  Disposition: Will move to step down unit  Consultants:  Cardiology- Dr Elease Hashimoto  Orthopedics- Dr Lequita Halt  Procedures:  Aspiration of the right knee  Antibiotics:  IV vancomycin  IV Zosyn 4/24  Subjective: The patient seen this morning multiple times, in rapid A. fib with a heart rate 130s to 140s, coughing, rhonchorous breath sounds. No chest pain, nausea vomiting  Objective: Weight change:   Intake/Output Summary (Last 24 hours) at 09/22/13 1022 Last data filed at 09/22/13 1000  Gross per 24 hour  Intake    495 ml  Output    550 ml  Net    -55 ml   Blood pressure 128/70, pulse 136, temperature 99.2 F (37.3 C), temperature source Oral, resp. rate 27, height 6\' 8"  (2.032 m), weight 147.51 kg (325 lb 3.2  oz), SpO2 95.00%.  Physical Exam: General: Alert and awake, oriented x3, in mild distress CVS: Irregularly irregular, tachycardia Chest rhonchorous breath sounds bilaterally Abdomen: soft nontender, nondistended, normal bowel sounds  Extremities: Bilateral chronic venous changes  with cellulitis, right knee effusion   Lab Results: Basic Metabolic Panel:  Recent Labs Lab 09/21/13 0504 09/22/13 0535  NA 147 145  K 3.8 3.6*  CL 109 108  CO2 24 24  GLUCOSE 119* 114*  BUN 17 22  CREATININE 0.94 1.03  CALCIUM 8.8 8.8   Liver Function Tests:  Recent Labs Lab 09/21/13 0504 09/22/13 0535  AST 32 35  ALT 26 24  ALKPHOS 55 66  BILITOT 1.1 0.9  PROT 6.2 6.2  ALBUMIN 3.1* 3.0*   No results found for this basename: LIPASE, AMYLASE,  in the last 168 hours No results found for this basename: AMMONIA,  in the last 168 hours CBC:  Recent Labs Lab 09/21/13 0504 09/22/13 0535  WBC 12.0* 10.6*  NEUTROABS 9.3* 7.1  HGB 12.6* 12.0*  HCT 38.6* 37.0*  MCV 92.8 92.7  PLT 188 208   Cardiac Enzymes: No results found for this basename: CKTOTAL, CKMB, CKMBINDEX, TROPONINI,  in the last 168 hours BNP: No components found with this basename: POCBNP,  CBG: No results found for this basename: GLUCAP,  in the last 168 hours   Micro Results: Recent Results (from the past 240 hour(s))  URINE CULTURE     Status: None   Collection Time    09/18/13  4:32 PM      Result Value Ref Range Status   Specimen Description URINE, CLEAN CATCH   Final   Special Requests NONE   Final   Culture  Setup Time     Final   Value: 09/18/2013 17:21     Performed at [facility]   Colony Count     Final   Value: NO GROWTH     Performed at [facility]   Culture     Final   Value: NO GROWTH     Performed at [facility]   Report Status 09/19/2013 FINAL   Final    Studies/Results: Ct Abdomen Pelvis Wo Contrast  09/18/2013   CLINICAL DATA:  Lower extremity pain and weakness after a fall today. History of abdominal aortic aneurysm.  EXAM: CT ABDOMEN AND PELVIS WITHOUT CONTRAST  TECHNIQUE: Multidetector CT imaging of the abdomen and pelvis was performed following the standard protocol without intravenous contrast.  COMPARISON:  12/30/2009  FINDINGS:  Atelectasis in the lung bases.  Calcification in the aortic valve.  Evaluation of solid organs and vascular structures is limited due to the lack of IV contrast material. Surgical absence of the gallbladder. Biliary gas is likely postoperative. Diffuse fatty infiltration of the pancreas. Unenhanced appearance of the spleen, adrenal glands, inferior vena cava, and retroperitoneal lymph nodes is unremarkable. Multiple low-attenuation lesions in both kidneys, largest in the mid lower pole on the right measuring 6 cm diameter. These are consistent with cysts and were previously identified on the prior study. No hydronephrosis in either kidney. Punctate size calcifications in both kidneys consistent with nonobstructing stones. There is calcification of the abdominal aorta with focal dilatation to maximal AP diameter of 4.2 cm. This appears stable since previous study. No abnormal retroperitoneal fluid collections. The stomach, small bowel, and colon are not abnormally distended. No free air or free fluid in the abdomen. Prominent visceral adipose tissues.  Pelvis: There is a bladder stone  measuring 2.1 cm diameter. This is unchanged since previous study. The prostate gland is enlarged, measuring 6.1 x 4.5 cm. No pelvic lymphadenopathy. The appendix is normal. There is a focal fluid or scarring demonstrated in the right lower quadrant but this is adjacent to the cecum and is distant from the appendix. This could represent focal inflammation or nonspecific fluid. No evidence to suggest diverticulitis. Degenerative changes in the lumbar spine. Degenerative changes appear to cause central canal stenosis at at least the L2-3, L3-4, and L4-5 levels. No destructive bone lesions appreciated.  IMPRESSION: Abdominal aortic aneurysm measuring 4.2 cm maximal diameter. Punctate size nonobstructing renal stones bilaterally. Bilateral renal cysts. Fatty infiltration of the pancreas. Normal appendix. Bladder stone. Prostate gland  enlargement. Small nonspecific fluid or infiltration demonstrated in the right lower quadrant separate from the normal appendix.   Electronically Signed   By: Burman Nieves M.D.   On: 09/18/2013 22:49   Dg Chest 2 View  09/18/2013   CLINICAL DATA:  Weakness  EXAM: CHEST - 2 VIEW  COMPARISON:  DG CHEST 1V PORT dated 05/31/2008  FINDINGS: Borderline cardiomegaly allowing for AP technique. Vascular congestion. Bibasilar atelectasis. No Kerley B-lines. No pneumothorax. No pleural effusion.  IMPRESSION: Bibasilar atelectasis.   Electronically Signed   By: Maryclare Bean M.D.   On: 09/18/2013 18:06   Dg Hip Complete Right  09/19/2013   CLINICAL DATA:  Status post fall 2 days ago.  Right hip pain.  EXAM: RIGHT HIP - COMPLETE 2+ VIEW  COMPARISON:  Plain films right hip 05/28/2008.  FINDINGS: Dynamic hip screw is in place for a healed intertrochanteric fracture. Hardware is intact. There is partial visualization of plate and screws for fixation of a right femur fracture. No acute bony or joint abnormality is identified. The left hip is unremarkable.  IMPRESSION: No acute finding. Status post fixation of right hip and femur fractures.   Electronically Signed   By: Drusilla Kanner M.D.   On: 09/19/2013 18:13   Dg Femur Right  09/19/2013   CLINICAL DATA:  Status post fall 2 days ago.  Right femoral pain.  EXAM: RIGHT FEMUR - 2 VIEW  COMPARISON:  Plain films of the right femur 05/28/2008.  FINDINGS: No acute bony or joint abnormality is identified. The patient has a healed distal femur fracture with plate and screws in place. The plate is broken but unchanged. Small knee joint effusion is identified.  IMPRESSION: No acute abnormality. Healed distal femur fracture with hardware in place. Stable compared to prior exam.   Electronically Signed   By: Drusilla Kanner M.D.   On: 09/19/2013 18:16   Dg Tibia/fibula Right  09/18/2013   CLINICAL DATA:  Leg pain and swelling  EXAM: RIGHT TIBIA AND FIBULA - 2 VIEW  COMPARISON:   None.  FINDINGS: Four views of the right tibia-fibula submitted. No acute fracture or subluxation. Mild soft tissue swelling.  IMPRESSION: No acute fracture or subluxation.  Diffuse soft tissue swelling.   Electronically Signed   By: Natasha Mead M.D.   On: 09/18/2013 18:16   Ct Lumbar Spine Wo Contrast  09/20/2013   CLINICAL DATA:  History of lumbar spine surgery. Right leg weakness. Rule out spinal stenosis.  EXAM: CT LUMBAR SPINE WITHOUT CONTRAST  TECHNIQUE: Multidetector CT imaging of the lumbar spine was performed without intravenous contrast administration. Multiplanar CT image reconstructions were also generated.  COMPARISON:  None.  FINDINGS: Some lumbar CT images were retrospectively acquired from a CT abdomen pelvis on 09/18/2013.  Atherosclerotic aorta with the infrarenal abdominal aortic aneurysm measuring up to 44 x 36 mm. Large bladder stone measures 19 mm.  Negative for lumbar spine fracture or mass. No acute bony change. The lumbar alignment is normal.  T12-L1:  Mild disc and facet degeneration with mild spinal stenosis  L1-2: Disc bulging and facet degeneration with moderate spinal stenosis.  L2-3: Disc desiccation and vacuum disc phenomenon. Disc bulging and spondylosis. Bilateral facet hypertrophy. Moderate to severe spinal stenosis. Neural foraminal encroachment bilaterally  L3-4: Diffuse disc bulging. Bilateral facet and ligamentum flavum hypertrophy. Severe spinal stenosis. Marked lateral recess and foraminal encroachment bilaterally.  L4-5: Laminotomy on the right. Disc degeneration and spondylosis. Bilateral facet hypertrophy. There is foraminal encroachment bilaterally. Spinal canal is adequate in size.  L5-S1: Spondylosis and facet degeneration causing foraminal narrowing bilaterally.  IMPRESSION: Negative for lumbar fracture or mass lesion.  Multilevel disc and facet degeneration. Multilevel spinal stenosis. Foraminal encroachment is present bilaterally at multiple levels due to spurring.   Abdominal aortic aneurysm measuring 44 x 36 mm.  Large bladder stone.   Electronically Signed   By: Marlan Palauharles  Clark M.D.   On: 09/20/2013 18:35   Mr Brain Wo Contrast  09/18/2013   CLINICAL DATA:  Right lower extremity weakness.  EXAM: MRI HEAD WITHOUT CONTRAST  TECHNIQUE: Multiplanar, multiecho pulse sequences of the brain and surrounding structures were obtained without intravenous contrast.  COMPARISON:  None.  FINDINGS: Mild motion degraded examination. No reduced diffusion to suggest acute ischemia. No susceptibility artifact to suggest hemorrhage. Minimal basal ganglia mineralization.  Moderate to severe ventriculomegaly, likely on the basis of global parenchymal brain volume loss as there is overall commensurate enlargement of cerebellar folia and sulci, though there is sulcal effacement at the convexities. 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 Minimal white matter T2 hyperintensities, less than expected for age suggest chronic small vessel ischemic disease.  No abnormal extra-axial fluid collections. Normal major intracranial vascular flow voids preserved at the skullbase, however there is mild dolichoectasia of the intracranial vessels which can be related to chronic hypertension.  Status post bilateral ocular lens implants. Trace paranasal sinus mucosal thickening without air-fluid levels. Mastoid air cells are well aerated. Mild temporomandibular osteoarthrosis. No abnormal sellar expansion. No cerebellar tonsillar ectopia.  IMPRESSION: No acute intracranial process, specifically no evidence of acute ischemia on this mildly motion degraded examination.  Sulcal effacement at the convexities may be seen with normal pressure hydrocephalus.  Mild white matter changes likely reflect chronic small vessel ischemic disease, less than expected for age in addition to multiple remote left cerebellar infarcts.   Electronically Signed   By:  Awilda Metroourtnay  Bloomer   On: 09/18/2013 23:33   Ct Hip Right Wo Contrast  09/20/2013   CLINICAL DATA:  Right hip pain and limited range of motion.  EXAM: CT OF THE RIGHT HIP WITHOUT CONTRAST  TECHNIQUE: Multidetector CT imaging was performed according to the standard protocol. Multiplanar CT image reconstructions were also generated.  COMPARISON:  Plain films right hip and femur 09/19/2013. CT abdomen and pelvis 09/18/2013.  FINDINGS: No acute fracture is identified. The patient has a healed right intertrochanteric fracture with fixation hardware in place. Hardware is intact. The right hip is located. There is no notable degenerative disease about the right hip. No avascular necrosis of the right femoral head is seen. There is no hip joint effusion. All visualized musculature is intact and normal in appearance. Imaged intrapelvic contents demonstrate urinary  bladder stone as seen on the comparison CT.  IMPRESSION: Negative for fracture.  No acute abnormality.  Healed right intertrochanteric fracture with intact hardware in place.  Urinary bladder stone.   Electronically Signed   By: Drusilla Kanner M.D.   On: 09/20/2013 09:55   US Venous Img Lower Bilateral  09/08/2013   CLINICAL DATA:  Bilateral lower extremity edema; history of previous DVT in the left leg  EXAM: BILATERAL LOWER EXTREMITY VENOUS DOPPLER ULTRASOUND  TECHNIQUE: Gray-scale sonography with graded compression, as well as color Doppler and duplex ultrasound were performed to evaluate the lower extremity deep venous systems from the level of the common femoral vein and including the common femoral, femoral, profunda femoral, popliteal and calf veins including the posterior tibial, peroneal and gastrocnemius veins when visible. The superficial great saphenous vein was also interrogated. Spectral Doppler was utilized to evaluate flow at rest and with distal augmentation maneuvers in the common femoral, femoral and popliteal veins.  COMPARISON:  None.   FINDINGS: RIGHT LOWER EXTREMITY  Common Femoral Vein: No evidence of thrombus. Normal compressibility, respiratory phasicity and response to augmentation.  Saphenofemoral Junction: No evidence of thrombus. Normal compressibility and flow on color Doppler imaging.  Profunda Femoral Vein: No evidence of thrombus. Normal compressibility and flow on color Doppler imaging.  Femoral Vein: No evidence of thrombus. Normal compressibility, respiratory phasicity and response to augmentation.  Popliteal Vein: No evidence of thrombus. Normal compressibility, respiratory phasicity and response to augmentation.  Calf Veins: No evidence of thrombus. Normal compressibility and flow on color Doppler imaging.  Superficial Great Saphenous Vein: No evidence of thrombus. Normal compressibility and flow on color Doppler imaging.  Venous Reflux:  None.  Other Findings: The peroneal vein and anterior tibial vein were not visualized on the right.  LEFT LOWER EXTREMITY  Common Femoral Vein: No evidence of thrombus. Normal compressibility, respiratory phasicity and response to augmentation.  Saphenofemoral Junction: No evidence of thrombus. Normal compressibility and flow on color Doppler imaging.  Profunda Femoral Vein: No evidence of thrombus. Normal compressibility and flow on color Doppler imaging.  Femoral Vein: There is nonocclusive thrombus within the superficial femoral vein.  Popliteal Vein: There is nonocclusive thrombus within the popliteal vein.  Calf Veins: No evidence of thrombus. Normal compressibility and flow on color Doppler imaging.  Superficial Great Saphenous Vein: No evidence of thrombus. Normal compressibility and flow on color Doppler imaging.  Venous Reflux:  None.  Other Findings: The peroneal and anterior tibial veins were not demonstrated on the left.  IMPRESSION: 1. There is no evidence of deep venous thrombosis in the visualized veins of the right lower extremity. 2. On the left there is likely old thrombus which  is nonocclusive in the superficial femoral and popliteal veins. 3. The large amount of edema as well as erythema and ulceration of the legs limits the study somewhat.   Electronically Signed   By: David  [place]   On: 09/08/2013 15:47   Dg Chest Port 1 View  09/22/2013   CLINICAL DATA:  Bibasilar atelectasis on previous chest x-ray.  EXAM: PORTABLE CHEST - 1 VIEW  COMPARISON:  Two-view chest x-ray 09/18/2013.  FINDINGS: The heart is enlarged. Mild interstitial edema has increased. Mild bibasilar airspace disease is similar to the prior exam, likely representing atelectasis or scarring. No significant airspace consolidation is present.  IMPRESSION: 1. Cardiomegaly and increasing interstitial edema suggesting congestive heart failure. 2. Bibasilar airspace disease likely are presents atelectasis or scarring.   Electronically Signed  By: Gennette Pac M.D.   On: 09/22/2013 08:30    Medications: Scheduled Meds: . digoxin  0.5 mg Intravenous Once  . doxazosin  4 mg Oral QHS  . finasteride  5 mg Oral QHS  . furosemide  40 mg Intravenous BID  . ipratropium  0.5 mg Nebulization Q4H  . latanoprost  1 drop Both Eyes QHS  . levalbuterol  1.25 mg Nebulization 4 times per day  . lidocaine  10 mL Intradermal Once  . methylPREDNISolone (SOLU-MEDROL) injection  125 mg Intravenous Once  . methylPREDNISolone (SOLU-MEDROL) injection  60 mg Intravenous Q12H  . piperacillin-tazobactam (ZOSYN)  IV  3.375 g Intravenous 3 times per day  . sodium chloride  3 mL Intravenous Q12H  . vancomycin  1,500 mg Intravenous Q12H  . vitamin B-12  1,000 mcg Oral Daily  . warfarin  3.75 mg Oral Q M,W,F-1800  . [START ON 09/23/2013] warfarin  7.5 mg Oral Once per day on Sun Tue Thu Sat  . Warfarin - Pharmacist Dosing Inpatient   Does not apply q1800      LOS: 4 days   Ripudeep Jenna Luo M.D. [facility] 09/22/2013, 10:22 AM Pager: [phone]  If 7PM-7AM, please contact night-coverage [email or web address] Password  [email or web address]  **Disclaimer: This note was dictated with voice recognition software. Similar sounding words can inadvertently be transcribed and this note may contain transcription errors which may not have been corrected upon publication of note.**

## 2013-09-22 NOTE — Progress Notes (Signed)
Patient transferred to 2H16 via bed with O2 at 2L and heart monitor.  RAF 150, cardizem gtt at 5mg .  Patient alert and conversant, mild SOB.  RN at bedside updated on patient status.

## 2013-09-22 NOTE — Progress Notes (Addendum)
ANTICOAGULATION  And ANTIBIOTIC CONSULT NOTE - Follow Up Consult  Pharmacy Consult for coumadin; vancomycin and zosyn Indication: hx DVT; cellulitis, HCAP  Allergies  Allergen Reactions  . Oxycodone Hcl     REACTION: makes pt "wild"  . Propoxyphene N-Acetaminophen     REACTION: Makes pt. "wild"    Patient Measurements: Height: 6\' 8"  (203.2 cm) Weight: 325 lb 3.2 oz (147.51 kg) IBW/kg (Calculated) : 96   Vital Signs: Temp: 99.2 F (37.3 C) (04/24 0529) Temp src: Oral (04/24 0529) BP: 172/77 mmHg (04/24 0529) Pulse Rate: 95 (04/24 0529)  Labs:  Recent Labs  09/20/13 0442 09/21/13 0504 09/22/13 0535  HGB 12.6* 12.6* 12.0*  HCT 39.0 38.6* 37.0*  PLT 163 188 208  LABPROT 21.3* 24.6* 25.5*  INR 1.91* 2.31* 2.42*  CREATININE 0.98 0.94 1.03    Estimated Creatinine Clearance: 94.3 ml/min (by C-G formula based on Cr of 1.03).   Assessment: Patient is an 80 y.o M on coumadin for hx DVT and now in afib with RVR.  INR is therapeutic at 2.42 this morning.  No bleeding documented.  To start zosyn for suspected HCAP d/t respiratory distress this morning.  With suspected PNA, will change goal vancomycin 15-20.  Vancomycin dose was reduced to 1250 mg yesterday since goal trough at the time was 10-15 for cellulitis.  Will change regimen back to 1500mg  IV q12h since level was 17.6 with this regimen.  Goal of Therapy:  INR 2-3; vancomycin trough level 15-20    Plan:  1) resume home coumadin regimen 7.5mg  daily except 3.75mg  on MWF 2) change vancomycin to 1500mg  IV q12h 3) zosyn 3.375gm IV q8h (infuse over 4 hours)  Lorraina Spring P Hargun Spurling 09/22/2013,9:27 AM

## 2013-09-23 ENCOUNTER — Inpatient Hospital Stay (HOSPITAL_COMMUNITY): Payer: Medicare Other

## 2013-09-23 DIAGNOSIS — I4891 Unspecified atrial fibrillation: Secondary | ICD-10-CM

## 2013-09-23 DIAGNOSIS — F09 Unspecified mental disorder due to known physiological condition: Secondary | ICD-10-CM

## 2013-09-23 DIAGNOSIS — J449 Chronic obstructive pulmonary disease, unspecified: Secondary | ICD-10-CM

## 2013-09-23 DIAGNOSIS — I498 Other specified cardiac arrhythmias: Secondary | ICD-10-CM

## 2013-09-23 LAB — CBC WITH DIFFERENTIAL/PLATELET
BASOS ABS: 0 10*3/uL (ref 0.0–0.1)
BASOS PCT: 0 % (ref 0–1)
Eosinophils Absolute: 0 10*3/uL (ref 0.0–0.7)
Eosinophils Relative: 0 % (ref 0–5)
HCT: 36.1 % — ABNORMAL LOW (ref 39.0–52.0)
Hemoglobin: 11.7 g/dL — ABNORMAL LOW (ref 13.0–17.0)
LYMPHS PCT: 10 % — AB (ref 12–46)
Lymphs Abs: 0.7 10*3/uL (ref 0.7–4.0)
MCH: 29.9 pg (ref 26.0–34.0)
MCHC: 32.4 g/dL (ref 30.0–36.0)
MCV: 92.3 fL (ref 78.0–100.0)
Monocytes Absolute: 0.6 10*3/uL (ref 0.1–1.0)
Monocytes Relative: 8 % (ref 3–12)
Neutro Abs: 6.1 10*3/uL (ref 1.7–7.7)
Neutrophils Relative %: 82 % — ABNORMAL HIGH (ref 43–77)
Platelets: 218 10*3/uL (ref 150–400)
RBC: 3.91 MIL/uL — ABNORMAL LOW (ref 4.22–5.81)
RDW: 16.2 % — AB (ref 11.5–15.5)
WBC: 7.4 10*3/uL (ref 4.0–10.5)

## 2013-09-23 LAB — TROPONIN I: Troponin I: <0.3 ng/mL (ref <0.30)

## 2013-09-23 LAB — COMPREHENSIVE METABOLIC PANEL
ALK PHOS: 62 U/L (ref 39–117)
ALT: 25 U/L (ref 0–53)
AST: 31 U/L (ref 0–37)
Albumin: 2.7 g/dL — ABNORMAL LOW (ref 3.5–5.2)
BILIRUBIN TOTAL: 0.6 mg/dL (ref 0.3–1.2)
BUN: 29 mg/dL — ABNORMAL HIGH (ref 6–23)
CHLORIDE: 110 meq/L (ref 96–112)
CO2: 24 mEq/L (ref 19–32)
Calcium: 8.9 mg/dL (ref 8.4–10.5)
Creatinine, Ser: 1.17 mg/dL (ref 0.50–1.35)
GFR calc non Af Amer: 57 mL/min — ABNORMAL LOW (ref >90)
GFR, EST AFRICAN AMERICAN: 66 mL/min — AB (ref >90)
GLUCOSE: 169 mg/dL — AB (ref 70–99)
POTASSIUM: 4 meq/L (ref 3.7–5.3)
Sodium: 146 mEq/L (ref 137–147)
Total Protein: 6.1 g/dL (ref 6.0–8.3)

## 2013-09-23 LAB — PROTIME-INR
INR: 2.34 — ABNORMAL HIGH (ref 0.00–1.49)
PROTHROMBIN TIME: 24.9 s — AB (ref 11.6–15.2)

## 2013-09-23 LAB — PHOSPHORUS: PHOSPHORUS: 2.9 mg/dL (ref 2.3–4.6)

## 2013-09-23 LAB — EXPECTORATED SPUTUM ASSESSMENT W GRAM STAIN, RFLX TO RESP C: Special Requests: NORMAL

## 2013-09-23 LAB — MAGNESIUM: Magnesium: 2.4 mg/dL (ref 1.5–2.5)

## 2013-09-23 LAB — EXPECTORATED SPUTUM ASSESSMENT W REFEX TO RESP CULTURE

## 2013-09-23 MED ORDER — LEVALBUTEROL HCL 1.25 MG/0.5ML IN NEBU
1.2500 mg | INHALATION_SOLUTION | Freq: Two times a day (BID) | RESPIRATORY_TRACT | Status: DC
  Administered 2013-09-23 – 2013-09-25 (×4): 1.25 mg via RESPIRATORY_TRACT
  Filled 2013-09-23 (×6): qty 0.5

## 2013-09-23 MED ORDER — WARFARIN SODIUM 2.5 MG PO TABS: 3.7500 mg | ORAL_TABLET | ORAL | Status: DC

## 2013-09-23 MED ORDER — DILTIAZEM HCL ER COATED BEADS 120 MG PO CP24
120.0000 mg | ORAL_CAPSULE | Freq: Every day | ORAL | Status: DC
  Administered 2013-09-24 – 2013-09-25 (×2): 120 mg via ORAL
  Filled 2013-09-23 (×2): qty 1

## 2013-09-23 NOTE — Consult Note (Addendum)
PULMONARY / CRITICAL CARE MEDICINE   Name: [name] MRN: [id number] DOB: 11-Sep-1931    ADMISSION DATE:  09/18/2013 CONSULTATION DATE:  4/24  REFERRING MD :  Triad PRIMARY SERVICE:Triad  CHIEF COMPLAINT:  SOB  BRIEF PATIENT DESCRIPTION:  80 yo former smoker who changed to snuff 2 years ago and has a host of medical problems and [facility] with complaints of falls, weakness and general failure to thrive. Past medical problems of PE on coumadin, COPD (not followed by pulmonary),AAA repair, PVD with chronic venous statis, LE DVT, he presented with INR1.92.  ON 4/24 he developed increased work of breathing, atrial fibrillation with rvr and as transferred to ICU and PCCM asked to evaluate. He was not in acute resp distress but did appear volume overloaded and had new onset of Afib with RVR. Note he had his right knee aspirated per ortho(Alusio) this admit. PCCM will help manage his care with cardiology input. Check 2 d echo and diuresis as tolerated.  SIGNIFICANT EVENTS / STUDIES:  4/25 tx to icu afib rvr  LINES / TUBES: PIV  CULTURES: None  ANTIBIOTICS: 4/24 vanc>> 4/24 pip-tazo>>  SUBJECTIVE: improved SOB, off BiPAP, on [place].  VITAL SIGNS: Temp:  [97.6 F (36.4 C)-100 F (37.8 C)] 97.6 F (36.4 C) (04/25 0746) Pulse Rate:  [43-136] 43 (04/25 0746) Resp:  [14-27] 19 (04/25 0800) BP: (92-164)/(40-75) 115/41 mmHg (04/25 0800) SpO2:  [93 %-98 %] 96 % (04/25 0800) Weight:  [327 lb 6.1 oz (148.5 kg)] 327 lb 6.1 oz (148.5 kg) (04/25 0458) HEMODYNAMICS:   VENTILATOR SETTINGS:   INTAKE / OUTPUT: Intake/Output     04/24 0701 - 04/25 0700 04/25 0701 - 04/26 0700   P.O. 160 360   I.V. (mL/kg) 78 (0.5)    IV Piggyback 1150    Total Intake(mL/kg) 1388 (9.3) 360 (2.4)   Urine (mL/kg/hr) 2300 (0.6) 400 (0.9)   Total Output 2300 400   Net -912 -40        Stool Occurrence 1 x      PHYSICAL EXAMINATION: General:  Obese wm, NAD at rest Neuro:  Awake and talkative.  MAEx 4 to commands HEENT: No JVD/LAN/cartoid bruit Cardiovascular:  HSIR Afib RVR 134 Lungs:  Diminished, mild rhonchi, basilar crackles Abdomen: obese +bs Musculoskeletal:  Rt knee with dressing from tap Skin:  Chronic lower ext edema and statis. Multiple areas of ecchymosis.   LABS:  CBC  Recent Labs Lab 09/21/13 0504 09/22/13 0535 09/23/13 0405  WBC 12.0* 10.6* 7.4  HGB 12.6* 12.0* 11.7*  HCT 38.6* 37.0* 36.1*  PLT 188 208 218   Coag's  Recent Labs Lab 09/21/13 0504 09/22/13 0535 09/23/13 0405  INR 2.31* 2.42* 2.34*   BMET  Recent Labs Lab 09/21/13 0504 09/22/13 0535 09/23/13 0405  NA 147 145 146  K 3.8 3.6* 4.0  CL 109 108 110  CO2 24 24 24   BUN 17 22 29*  CREATININE 0.94 1.03 1.17  GLUCOSE 119* 114* 169*   Electrolytes  Recent Labs Lab 09/21/13 0504 09/22/13 0535 09/23/13 0405  CALCIUM 8.8 8.8 8.9  MG  --   --  2.4  PHOS  --   --  2.9   Sepsis Markers No results found for this basename: LATICACIDVEN, PROCALCITON, O2SATVEN,  in the last 168 hours ABG  Recent Labs Lab 09/22/13 1008  PHART 7.427  PCO2ART 36.6  PO2ART 63.0*   Liver Enzymes  Recent Labs Lab 09/21/13 0504 09/22/13 0535 09/23/13 0405  AST 32 35 31  ALT 26 24 25   ALKPHOS 55 66 62  BILITOT 1.1 0.9 0.6  ALBUMIN 3.1* 3.0* 2.7*   Cardiac Enzymes  Recent Labs Lab 09/18/13 1958  09/22/13 1300 09/22/13 1512 09/22/13 2228 09/23/13 0405  TROPONINI  --   < > 0.33* <0.30 <0.30 <0.30  PROBNP 245.5  --  2486.0*  --   --   --   < > = values in this interval not displayed. Glucose No results found for this basename: GLUCAP,  in the last 168 hours  Imaging Dg Chest Port 1 View  09/23/2013   CLINICAL DATA:  COPD  EXAM: PORTABLE CHEST - 1 VIEW  COMPARISON:  DG CHEST 1V PORT dated 09/22/2013  FINDINGS: Mild to moderate cardiac enlargement. Mild vascular congestion. Mild diffuse interstitial prominence peribronchial cuffing. The degree of vascular prominence and interstitial  prominence is mildly decreased when compared to the prior study. No significant pleural effusion.  IMPRESSION: Cardiac enlargement with vascular congestion and mild interstitial pulmonary edema, improved when compared to prior study.   Electronically Signed   By: Esperanza Heir M.D.   On: 09/23/2013 09:07   Dg Chest Port 1 View  09/22/2013   CLINICAL DATA:  Bibasilar atelectasis on previous chest x-ray.  EXAM: PORTABLE CHEST - 1 VIEW  COMPARISON:  Two-view chest x-ray 09/18/2013.  FINDINGS: The heart is enlarged. Mild interstitial edema has increased. Mild bibasilar airspace disease is similar to the prior exam, likely representing atelectasis or scarring. No significant airspace consolidation is present.  IMPRESSION: 1. Cardiomegaly and increasing interstitial edema suggesting congestive heart failure. 2. Bibasilar airspace disease likely are presents atelectasis or scarring.   Electronically Signed   By: Gennette Pac M.D.   On: 09/22/2013 08:30   ASSESSMENT / PLAN:  PULMONARY A: COPD, not wheezing     Hx of PE, ro acute MOst impressed for edema as cause, secondary fib rvr P:   - O2 as needed. - Keep off steroids. - Xopenex if BD needed. - No need for intubation at this time. - D/C BiPAP. - Continue active diureses. - F/U on echo.  - Control HR to prevent worsening edema.  CARDIOVASCULAR A: New onset A fib RVR      DVT, r/o PE ( has h/o and inr less 2)  P:  - Dilt per cards. - Keep in SDU. - May need DCCV - Diuresis as tolerated - Anticoagulation is therapeutic, continue for now.  RENAL Lab Results  Component Value Date   CREATININE 1.17 09/23/2013   CREATININE 1.03 09/22/2013   CREATININE 0.94 09/21/2013   CREATININE 1.26 10/06/2012    A:  No acute issue P:   - Monoitor lytes/creatine with diuresis - Replace electrolytes as indicated.  GASTROINTESTINAL A:  GI protection P:   - Progress diet as tolerated. - PPI.  HEMATOLOGIC A: Chronic coumadin therapy P:  -  Monitor INR in am further. - Cbc in am.  INFECTIOUS A:  R/o pna, likely mostly edema, knee P:   - On v/z, note recent knee tap, will defer to primary.  ENDOCRINE CBG (last 3)   A:  No acute issue.  TSH normal. P:   - Monitor.  NEUROLOGIC A:  Chronic dementia P:   - Monitor mental status.  TODAY'S SUMMARY: May transfer out of SDU, needs tight HR control and diureses, PCCM will sign off, please call back if needed.  I have personally obtained a history, examined the patient, evaluated laboratory and  imaging results, formulated the assessment and plan and placed orders.  Alyson ReedyWesam G. Pepper Wyndham, M.D. [facility]. Pager: [phone]. After hours pager: [phone].

## 2013-09-23 NOTE — Progress Notes (Signed)
Patient ID: [name]  male  MVH:[id number]    DOB: 1931/10/05    DOA: 09/18/2013  PCP: Redmond BasemanWONG,FRANCIS PATRICK, MD  Assessment/Plan: Principal Problem:   Acute respiratory failure/acute respiratory distress: Significantly improved, ? Aspiration versus COPD exacerbation versus volume overload.  -Chest x-ray had shown cardiomegaly with increasing interstitial edema, bibasilar airspace disease - Placed on scheduled bronchodilators with Xopenex, Atrovent, Solu-Medrol 125 mg x1,  - Continue IV diuresis, bronchodilators, vancomycin and ZosyN  Active Problems: Atrial fibrillation with RVR: Possibly could have triggered due to #1: Currently having bradycardia issues, with heart rate in 40s to low 60s - New onset atrial fibrillation, with no prior history, confirmed with patient's daughter. He was ruled out for acute ACS - The patient was transferred to step down unit for Cardizem drip. He has converted to sinus rhythm since yesterday and was placed on oral Cardizem.  - Patient is currently on Coumadin with therapeutic INR of 2.34, hence PE unlikely - TSH 2.4 - continue IV Lasix, 2-D echo showed EF of 60-65%, mild to moderate aortic regurgitation. Cardiology following, may need to decrease Cardizem due to bradycardia.  Weakness, frequent fall;  - PT, OT consult. Recommending skilled nursing facility  - MRI negative for acute stroke, Sulcal effacement at the convexities may be seen with normal pressure hydrocephalus. Patient was seen by neurology, Dr. [name], recommended outpatient neurology workup for neuropathy and dementia. Per neurology, would not evaluate acutely for NPH given knee injury and anticoagulation  - TSH at 2.4., B12 level low    B12 deficiency  - Started on B12 replacement   Bilateral Lower extremities swelling, redness. Patient already on coumadin which cover for DVT. Component of venous insufficiency.  - Continue IV vancomycin for cellulitis   Small nonspecific fluid or  infiltration demonstrated in the right lower quadrant separate from the normal appendix : patient with no abdominal pain, Review scan with radiologist, fluid is non specific.   Right leg pain; and unable to even lift up the right leg, had frequent falls, 2 falls in last week with progressive weakness, history of lumbar spine surgery for disc many years ago - patient actually feels better from his leg and knee pain standpoint - MRI brain negative for ac CVA  - hip x ray and femur x ray negative for fracture, CT right hip negative - CT of the lumbar spine shows multilevel disc and possible degeneration, multilevel spinal stenosis, foramina encroachment bilateral at multiple levels due to spurring - Appreciate orthopedics evaluation  -Hypernatremia: mild, improving  -History of DVT; continue with coumadin.   DVT Prophylaxis: On Coumadin  Code Status: Full code  Family Communication: Discussed with patient's daughter on the phone  Disposition: Transfer to telemetry unit  Consultants:  Cardiology- Dr Elease HashimotoNahser  Orthopedics- Dr Lequita HaltAluisio  Pulmonology  Procedures:  Aspiration of the right knee  Antibiotics:  IV vancomycin  IV Zosyn 4/24  Subjective: Patient seen and examined today this morning, feeling a whole lot better, no significant shortness of breath, heart rate in low 40s, sinus rhythm  Objective: Weight change:   Intake/Output Summary (Last 24 hours) at 09/23/13 0709 Last data filed at 09/23/13 0500  Gross per 24 hour  Intake   1388 ml  Output   2300 ml  Net   -912 ml   Blood pressure 127/47, pulse 47, temperature 98.3 F (36.8 C), temperature source Oral, resp. rate 25, height 6\' 8"  (2.032 m), weight 148.5 kg (327 lb 6.1 oz), SpO2 94.00%.  Physical Exam: General: Alert and awake, oriented x3,  CVS: Normal sinus rhythm Chest wheezing significantly improved Abdomen: soft nontender, nondistended, normal bowel sounds  Extremities: Bilateral chronic venous changes,  but cellulitis has significantly improved   Lab Results: Basic Metabolic Panel:  Recent Labs Lab 09/22/13 0535 09/23/13 0405  NA 145 146  K 3.6* 4.0  CL 108 110  CO2 24 24  GLUCOSE 114* 169*  BUN 22 29*  CREATININE 1.03 1.17  CALCIUM 8.8 8.9  MG  --  2.4  PHOS  --  2.9   Liver Function Tests:  Recent Labs Lab 09/22/13 0535 09/23/13 0405  AST 35 31  ALT 24 25  ALKPHOS 66 62  BILITOT 0.9 0.6  PROT 6.2 6.1  ALBUMIN 3.0* 2.7*   No results found for this basename: LIPASE, AMYLASE,  in the last 168 hours No results found for this basename: AMMONIA,  in the last 168 hours CBC:  Recent Labs Lab 09/22/13 0535 09/23/13 0405  WBC 10.6* 7.4  NEUTROABS 7.1 6.1  HGB 12.0* 11.7*  HCT 37.0* 36.1*  MCV 92.7 92.3  PLT 208 218   Cardiac Enzymes:  Recent Labs Lab 09/22/13 1512 09/22/13 2228 09/23/13 0405  TROPONINI <0.30 <0.30 <0.30   BNP: No components found with this basename: POCBNP,  CBG: No results found for this basename: GLUCAP,  in the last 168 hours   Micro Results: Recent Results (from the past 240 hour(s))  URINE CULTURE     Status: None   Collection Time    09/18/13  4:32 PM      Result Value Ref Range Status   Specimen Description URINE, CLEAN CATCH   Final   Special Requests NONE   Final   Culture  Setup Time     Final   Value: 09/18/2013 17:21     Performed at [facility] Count     Final   Value: NO GROWTH     Performed at [facility]   Culture     Final   Value: NO GROWTH     Performed at [facility]   Report Status 09/19/2013 FINAL   Final  MRSA PCR SCREENING     Status: None   Collection Time    09/22/13  9:59 AM      Result Value Ref Range Status   MRSA by PCR NEGATIVE  NEGATIVE Final   Comment:            The GeneXpert MRSA Assay (FDA     approved for NASAL specimens     only), is one component of a     comprehensive MRSA colonization     surveillance program. It is not     intended to  diagnose MRSA     infection nor to guide or     monitor treatment for     MRSA infections.  CULTURE, EXPECTORATED SPUTUM-ASSESSMENT     Status: None   Collection Time    09/22/13 11:35 PM      Result Value Ref Range Status   Specimen Description SPUTUM   Final   Special Requests Normal   Final   Sputum evaluation     Final   Value: THIS SPECIMEN IS ACCEPTABLE. RESPIRATORY CULTURE REPORT TO FOLLOW.   Report Status 09/23/2013 FINAL   Final    Studies/Results: Ct Abdomen Pelvis Wo Contrast  09/18/2013   CLINICAL DATA:  Lower extremity pain and weakness after a fall  today. History of abdominal aortic aneurysm.  EXAM: CT ABDOMEN AND PELVIS WITHOUT CONTRAST  TECHNIQUE: Multidetector CT imaging of the abdomen and pelvis was performed following the standard protocol without intravenous contrast.  COMPARISON:  12/30/2009  FINDINGS: Atelectasis in the lung bases.  Calcification in the aortic valve.  Evaluation of solid organs and vascular structures is limited due to the lack of IV contrast material. Surgical absence of the gallbladder. Biliary gas is likely postoperative. Diffuse fatty infiltration of the pancreas. Unenhanced appearance of the spleen, adrenal glands, inferior vena cava, and retroperitoneal lymph nodes is unremarkable. Multiple low-attenuation lesions in both kidneys, largest in the mid lower pole on the right measuring 6 cm diameter. These are consistent with cysts and were previously identified on the prior study. No hydronephrosis in either kidney. Punctate size calcifications in both kidneys consistent with nonobstructing stones. There is calcification of the abdominal aorta with focal dilatation to maximal AP diameter of 4.2 cm. This appears stable since previous study. No abnormal retroperitoneal fluid collections. The stomach, small bowel, and colon are not abnormally distended. No free air or free fluid in the abdomen. Prominent visceral adipose tissues.  Pelvis: There is a bladder  stone measuring 2.1 cm diameter. This is unchanged since previous study. The prostate gland is enlarged, measuring 6.1 x 4.5 cm. No pelvic lymphadenopathy. The appendix is normal. There is a focal fluid or scarring demonstrated in the right lower quadrant but this is adjacent to the cecum and is distant from the appendix. This could represent focal inflammation or nonspecific fluid. No evidence to suggest diverticulitis. Degenerative changes in the lumbar spine. Degenerative changes appear to cause central canal stenosis at at least the L2-3, L3-4, and L4-5 levels. No destructive bone lesions appreciated.  IMPRESSION: Abdominal aortic aneurysm measuring 4.2 cm maximal diameter. Punctate size nonobstructing renal stones bilaterally. Bilateral renal cysts. Fatty infiltration of the pancreas. Normal appendix. Bladder stone. Prostate gland enlargement. Small nonspecific fluid or infiltration demonstrated in the right lower quadrant separate from the normal appendix.   Electronically Signed   By: Burman Nieves M.D.   On: 09/18/2013 22:49   Dg Chest 2 View  09/18/2013   CLINICAL DATA:  Weakness  EXAM: CHEST - 2 VIEW  COMPARISON:  DG CHEST 1V PORT dated 05/31/2008  FINDINGS: Borderline cardiomegaly allowing for AP technique. Vascular congestion. Bibasilar atelectasis. No Kerley B-lines. No pneumothorax. No pleural effusion.  IMPRESSION: Bibasilar atelectasis.   Electronically Signed   By: Maryclare Bean M.D.   On: 09/18/2013 18:06   Dg Hip Complete Right  09/19/2013   CLINICAL DATA:  Status post fall 2 days ago.  Right hip pain.  EXAM: RIGHT HIP - COMPLETE 2+ VIEW  COMPARISON:  Plain films right hip 05/28/2008.  FINDINGS: Dynamic hip screw is in place for a healed intertrochanteric fracture. Hardware is intact. There is partial visualization of plate and screws for fixation of a right femur fracture. No acute bony or joint abnormality is identified. The left hip is unremarkable.  IMPRESSION: No acute finding. Status  post fixation of right hip and femur fractures.   Electronically Signed   By: Drusilla Kanner M.D.   On: 09/19/2013 18:13   Dg Femur Right  09/19/2013   CLINICAL DATA:  Status post fall 2 days ago.  Right femoral pain.  EXAM: RIGHT FEMUR - 2 VIEW  COMPARISON:  Plain films of the right femur 05/28/2008.  FINDINGS: No acute bony or joint abnormality is identified. The patient has a healed  distal femur fracture with plate and screws in place. The plate is broken but unchanged. Small knee joint effusion is identified.  IMPRESSION: No acute abnormality. Healed distal femur fracture with hardware in place. Stable compared to prior exam.   Electronically Signed   By: Drusilla Kannerhomas  Dalessio M.D.   On: 09/19/2013 18:16   Dg Tibia/fibula Right  09/18/2013   CLINICAL DATA:  Leg pain and swelling  EXAM: RIGHT TIBIA AND FIBULA - 2 VIEW  COMPARISON:  None.  FINDINGS: Four views of the right tibia-fibula submitted. No acute fracture or subluxation. Mild soft tissue swelling.  IMPRESSION: No acute fracture or subluxation.  Diffuse soft tissue swelling.   Electronically Signed   By: Natasha MeadLiviu  Pop M.D.   On: 09/18/2013 18:16   Ct Lumbar Spine Wo Contrast  09/20/2013   CLINICAL DATA:  History of lumbar spine surgery. Right leg weakness. Rule out spinal stenosis.  EXAM: CT LUMBAR SPINE WITHOUT CONTRAST  TECHNIQUE: Multidetector CT imaging of the lumbar spine was performed without intravenous contrast administration. Multiplanar CT image reconstructions were also generated.  COMPARISON:  None.  FINDINGS: Some lumbar CT images were retrospectively acquired from a CT abdomen pelvis on 09/18/2013.  Atherosclerotic aorta with the infrarenal abdominal aortic aneurysm measuring up to 44 x 36 mm. Large bladder stone measures 19 mm.  Negative for lumbar spine fracture or mass. No acute bony change. The lumbar alignment is normal.  T12-L1:  Mild disc and facet degeneration with mild spinal stenosis  L1-2: Disc bulging and facet degeneration  with moderate spinal stenosis.  L2-3: Disc desiccation and vacuum disc phenomenon. Disc bulging and spondylosis. Bilateral facet hypertrophy. Moderate to severe spinal stenosis. Neural foraminal encroachment bilaterally  L3-4: Diffuse disc bulging. Bilateral facet and ligamentum flavum hypertrophy. Severe spinal stenosis. Marked lateral recess and foraminal encroachment bilaterally.  L4-5: Laminotomy on the right. Disc degeneration and spondylosis. Bilateral facet hypertrophy. There is foraminal encroachment bilaterally. Spinal canal is adequate in size.  L5-S1: Spondylosis and facet degeneration causing foraminal narrowing bilaterally.  IMPRESSION: Negative for lumbar fracture or mass lesion.  Multilevel disc and facet degeneration. Multilevel spinal stenosis. Foraminal encroachment is present bilaterally at multiple levels due to spurring.  Abdominal aortic aneurysm measuring 44 x 36 mm.  Large bladder stone.   Electronically Signed   By: Marlan Palauharles  Clark M.D.   On: 09/20/2013 18:35   Mr Brain Wo Contrast  09/18/2013   CLINICAL DATA:  Right lower extremity weakness.  EXAM: MRI HEAD WITHOUT CONTRAST  TECHNIQUE: Multiplanar, multiecho pulse sequences of the brain and surrounding structures were obtained without intravenous contrast.  COMPARISON:  None.  FINDINGS: Mild motion degraded examination. No reduced diffusion to suggest acute ischemia. No susceptibility artifact to suggest hemorrhage. Minimal basal ganglia mineralization.  Moderate to severe ventriculomegaly, likely on the basis of global parenchymal brain volume loss as there is overall commensurate enlargement of cerebellar folia and sulci, though there is sulcal effacement at the convexities. 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 Minimal white matter T2 hyperintensities, less than expected for age suggest chronic small vessel ischemic disease.  No abnormal extra-axial fluid  collections. Normal major intracranial vascular flow voids preserved at the skullbase, however there is mild dolichoectasia of the intracranial vessels which can be related to chronic hypertension.  Status post bilateral ocular lens implants. Trace paranasal sinus mucosal thickening without air-fluid levels. Mastoid air cells are well aerated. Mild temporomandibular osteoarthrosis. No abnormal sellar expansion. No cerebellar  tonsillar ectopia.  IMPRESSION: No acute intracranial process, specifically no evidence of acute ischemia on this mildly motion degraded examination.  Sulcal effacement at the convexities may be seen with normal pressure hydrocephalus.  Mild white matter changes likely reflect chronic small vessel ischemic disease, less than expected for age in addition to multiple remote left cerebellar infarcts.   Electronically Signed   By: Awilda Metro   On: 09/18/2013 23:33   Ct Hip Right Wo Contrast  09/20/2013   CLINICAL DATA:  Right hip pain and limited range of motion.  EXAM: CT OF THE RIGHT HIP WITHOUT CONTRAST  TECHNIQUE: Multidetector CT imaging was performed according to the standard protocol. Multiplanar CT image reconstructions were also generated.  COMPARISON:  Plain films right hip and femur 09/19/2013. CT abdomen and pelvis 09/18/2013.  FINDINGS: No acute fracture is identified. The patient has a healed right intertrochanteric fracture with fixation hardware in place. Hardware is intact. The right hip is located. There is no notable degenerative disease about the right hip. No avascular necrosis of the right femoral head is seen. There is no hip joint effusion. All visualized musculature is intact and normal in appearance. Imaged intrapelvic contents demonstrate urinary bladder stone as seen on the comparison CT.  IMPRESSION: Negative for fracture.  No acute abnormality.  Healed right intertrochanteric fracture with intact hardware in place.  Urinary bladder stone.   Electronically  Signed   By: Drusilla Kanner M.D.   On: 09/20/2013 09:55   US Venous Img Lower Bilateral  09/08/2013   CLINICAL DATA:  Bilateral lower extremity edema; history of previous DVT in the left leg  EXAM: BILATERAL LOWER EXTREMITY VENOUS DOPPLER ULTRASOUND  TECHNIQUE: Gray-scale sonography with graded compression, as well as color Doppler and duplex ultrasound were performed to evaluate the lower extremity deep venous systems from the level of the common femoral vein and including the common femoral, femoral, profunda femoral, popliteal and calf veins including the posterior tibial, peroneal and gastrocnemius veins when visible. The superficial great saphenous vein was also interrogated. Spectral Doppler was utilized to evaluate flow at rest and with distal augmentation maneuvers in the common femoral, femoral and popliteal veins.  COMPARISON:  None.  FINDINGS: RIGHT LOWER EXTREMITY  Common Femoral Vein: No evidence of thrombus. Normal compressibility, respiratory phasicity and response to augmentation.  Saphenofemoral Junction: No evidence of thrombus. Normal compressibility and flow on color Doppler imaging.  Profunda Femoral Vein: No evidence of thrombus. Normal compressibility and flow on color Doppler imaging.  Femoral Vein: No evidence of thrombus. Normal compressibility, respiratory phasicity and response to augmentation.  Popliteal Vein: No evidence of thrombus. Normal compressibility, respiratory phasicity and response to augmentation.  Calf Veins: No evidence of thrombus. Normal compressibility and flow on color Doppler imaging.  Superficial Great Saphenous Vein: No evidence of thrombus. Normal compressibility and flow on color Doppler imaging.  Venous Reflux:  None.  Other Findings: The peroneal vein and anterior tibial vein were not visualized on the right.  LEFT LOWER EXTREMITY  Common Femoral Vein: No evidence of thrombus. Normal compressibility, respiratory phasicity and response to augmentation.   Saphenofemoral Junction: No evidence of thrombus. Normal compressibility and flow on color Doppler imaging.  Profunda Femoral Vein: No evidence of thrombus. Normal compressibility and flow on color Doppler imaging.  Femoral Vein: There is nonocclusive thrombus within the superficial femoral vein.  Popliteal Vein: There is nonocclusive thrombus within the popliteal vein.  Calf Veins: No evidence of thrombus. Normal compressibility and flow on color  Doppler imaging.  Superficial Great Saphenous Vein: No evidence of thrombus. Normal compressibility and flow on color Doppler imaging.  Venous Reflux:  None.  Other Findings: The peroneal and anterior tibial veins were not demonstrated on the left.  IMPRESSION: 1. There is no evidence of deep venous thrombosis in the visualized veins of the right lower extremity. 2. On the left there is likely old thrombus which is nonocclusive in the superficial femoral and popliteal veins. 3. The large amount of edema as well as erythema and ulceration of the legs limits the study somewhat.   Electronically Signed   By: David  [place]   On: 09/08/2013 15:47   Dg Chest Port 1 View  09/22/2013   CLINICAL DATA:  Bibasilar atelectasis on previous chest x-ray.  EXAM: PORTABLE CHEST - 1 VIEW  COMPARISON:  Two-view chest x-ray 09/18/2013.  FINDINGS: The heart is enlarged. Mild interstitial edema has increased. Mild bibasilar airspace disease is similar to the prior exam, likely representing atelectasis or scarring. No significant airspace consolidation is present.  IMPRESSION: 1. Cardiomegaly and increasing interstitial edema suggesting congestive heart failure. 2. Bibasilar airspace disease likely are presents atelectasis or scarring.   Electronically Signed   By: Gennette Pac M.D.   On: 09/22/2013 08:30    Medications: Scheduled Meds: . digoxin  0.5 mg Intravenous Once  . diltiazem  180 mg Oral Daily  . doxazosin  4 mg Oral QHS  . finasteride  5 mg Oral QHS  . furosemide  40 mg  Intravenous BID  . latanoprost  1 drop Both Eyes QHS  . levalbuterol  1.25 mg Nebulization TID  . lidocaine  10 mL Intradermal Once  . piperacillin-tazobactam (ZOSYN)  IV  3.375 g Intravenous 3 times per day  . sodium chloride  3 mL Intravenous Q12H  . vancomycin  1,500 mg Intravenous Q12H  . vitamin B-12  1,000 mcg Oral Daily  . warfarin  7.5 mg Oral Once per day on Sun Tue Thu Sat  . Warfarin - Pharmacist Dosing Inpatient   Does not apply q1800      LOS: 5 days   Ripudeep Jenna Luo M.D. [facility] 09/23/2013, 7:09 AM Pager: [phone]  If 7PM-7AM, please contact night-coverage [email or web address] Password [email or web address]  **Disclaimer: This note was dictated with voice recognition software. Similar sounding words can inadvertently be transcribed and this note may contain transcription errors which may not have been corrected upon publication of note.**

## 2013-09-23 NOTE — Progress Notes (Signed)
SUBJECTIVE: The patient is doing well today.  At this time, he denies chest pain, shortness of breath, or any new concerns.  He is back in sinus rhythm and really cannot recall symptoms of afib.  He had some bradycardia earlier for which he was also asymptomatic.  Melene Muller. [START ON 09/24/2013] diltiazem  120 mg Oral Daily  . doxazosin  4 mg Oral QHS  . finasteride  5 mg Oral QHS  . furosemide  40 mg Intravenous BID  . latanoprost  1 drop Both Eyes QHS  . levalbuterol  1.25 mg Nebulization TID  . lidocaine  10 mL Intradermal Once  . piperacillin-tazobactam (ZOSYN)  IV  3.375 g Intravenous 3 times per day  . sodium chloride  3 mL Intravenous Q12H  . vancomycin  1,500 mg Intravenous Q12H  . vitamin B-12  1,000 mcg Oral Daily  . [START ON 09/25/2013] warfarin  3.75 mg Oral Q M,W,F-1800  . warfarin  7.5 mg Oral Once per day on Sun Tue Thu Sat  . Warfarin - Pharmacist Dosing Inpatient   Does not apply q1800      OBJECTIVE: Physical Exam: Filed Vitals:   09/23/13 0600 09/23/13 0746 09/23/13 0800 09/23/13 1400  BP: 127/47 127/47 115/41 121/49  Pulse:  43  59  Temp:  97.6 F (36.4 C)  97.4 F (36.3 C)  TempSrc:  Oral  Oral  Resp: 25 14 19 16   Height:      Weight:      SpO2: 94% 97% 96% 95%    Intake/Output Summary (Last 24 hours) at 09/23/13 1423 Last data filed at 09/23/13 1301  Gross per 24 hour  Intake   1483 ml  Output   1800 ml  Net   -317 ml    Telemetry reveals sinus rhythm  GEN- The patient is elderly appearing, alert and oriented x 3 today.   Head- normocephalic, atraumatic Eyes-  Sclera clear, conjunctiva pink Ears- hearing intact Oropharynx- clear Neck- supple,  Lungs- Clear to ausculation bilaterally, normal work of breathing Heart- Regular rate and rhythm  GI- soft, NT, ND, + BS Extremities- no clubbing, cyanosis, + dependant edema with chronic venous stasis   LABS: Basic Metabolic Panel:  Recent Labs  16/03/9603/24/15 0535 09/23/13 0405  NA 145 146  K 3.6*  4.0  CL 108 110  CO2 24 24  GLUCOSE 114* 169*  BUN 22 29*  CREATININE 1.03 1.17  CALCIUM 8.8 8.9  MG  --  2.4  PHOS  --  2.9   Liver Function Tests:  Recent Labs  09/22/13 0535 09/23/13 0405  AST 35 31  ALT 24 25  ALKPHOS 66 62  BILITOT 0.9 0.6  PROT 6.2 6.1  ALBUMIN 3.0* 2.7*   No results found for this basename: LIPASE, AMYLASE,  in the last 72 hours CBC:  Recent Labs  09/22/13 0535 09/23/13 0405  WBC 10.6* 7.4  NEUTROABS 7.1 6.1  HGB 12.0* 11.7*  HCT 37.0* 36.1*  MCV 92.7 92.3  PLT 208 218   Echo is reviewed  ASSESSMENT AND PLAN:  1. Atrial fibrillation Asymptomatic and now back in sinus rhythm Continue coumadin long term (CHads2vasc score is at least 3) Continue low dose diltiazem I would not recommend AAD therapy at this time  2. Sinus bradycardia Mild bradycardia overnight without symptoms Decrease diltiazem to 120mg  daily No indication for pacing  Cardiology team to see as needed while here. Please call with questions.    Hillis RangeJames Richard Holz, MD 09/23/2013  2:23 PM

## 2013-09-23 NOTE — Progress Notes (Signed)
ANTICOAGULATION  CONSULT NOTE - Follow Up Consult  Pharmacy Consult for coumadin Indication: hx DVT  Allergies  Allergen Reactions  . Oxycodone Hcl     REACTION: makes pt "wild"  . Propoxyphene N-Acetaminophen     REACTION: Makes pt. "wild"    Patient Measurements: Height: 6\' 8"  (203.2 cm) Weight: 327 lb 6.1 oz (148.5 kg) (bed scale ) IBW/kg (Calculated) : 96   Vital Signs: Temp: 97.6 F (36.4 C) (04/25 0746) Temp src: Oral (04/25 0746) BP: 115/41 mmHg (04/25 0800) Pulse Rate: 43 (04/25 0746)  Labs:  Recent Labs  09/21/13 0504 09/22/13 0535  09/22/13 1512 09/22/13 2228 09/23/13 0405  HGB 12.6* 12.0*  --   --   --  11.7*  HCT 38.6* 37.0*  --   --   --  36.1*  PLT 188 208  --   --   --  218  LABPROT 24.6* 25.5*  --   --   --  24.9*  INR 2.31* 2.42*  --   --   --  2.34*  CREATININE 0.94 1.03  --   --   --  1.17  TROPONINI  --   --   < > <0.30 <0.30 <0.30  < > = values in this interval not displayed.  Estimated Creatinine Clearance: 83.3 ml/min (by C-G formula based on Cr of 1.17).   Assessment: Patient is an 80 y.o M on coumadin for hx DVT and now in afib with RVR.  INR is therapeutic at 2.34 this morning.  No bleeding documented.  Goal of Therapy:  INR 2-3    Plan:  1) Cont home coumadin regimen 7.5mg  daily except 3.75mg  on MWF 2?  Cont daily PT/INR   Elo Marmolejos Poteet Griffin Dewilde 09/23/2013,1:27 PM

## 2013-09-24 ENCOUNTER — Inpatient Hospital Stay (HOSPITAL_COMMUNITY): Payer: Medicare Other

## 2013-09-24 LAB — COMPREHENSIVE METABOLIC PANEL
ALK PHOS: 58 U/L (ref 39–117)
ALT: 36 U/L (ref 0–53)
AST: 40 U/L — AB (ref 0–37)
Albumin: 2.6 g/dL — ABNORMAL LOW (ref 3.5–5.2)
BUN: 32 mg/dL — ABNORMAL HIGH (ref 6–23)
CALCIUM: 8.2 mg/dL — AB (ref 8.4–10.5)
CO2: 23 meq/L (ref 19–32)
Chloride: 108 mEq/L (ref 96–112)
Creatinine, Ser: 1.23 mg/dL (ref 0.50–1.35)
GFR calc non Af Amer: 54 mL/min — ABNORMAL LOW (ref >90)
GFR, EST AFRICAN AMERICAN: 62 mL/min — AB (ref >90)
Glucose, Bld: 106 mg/dL — ABNORMAL HIGH (ref 70–99)
Potassium: 3.3 mEq/L — ABNORMAL LOW (ref 3.7–5.3)
SODIUM: 146 meq/L (ref 137–147)
TOTAL PROTEIN: 5.7 g/dL — AB (ref 6.0–8.3)
Total Bilirubin: 0.5 mg/dL (ref 0.3–1.2)

## 2013-09-24 LAB — VITAMIN D 1,25 DIHYDROXY
Vitamin D 1, 25 (OH)2 Total: 58 pg/mL (ref 18–72)
Vitamin D2 1, 25 (OH)2: <8 pg/mL
Vitamin D3 1, 25 (OH)2: 58 pg/mL

## 2013-09-24 LAB — CBC WITH DIFFERENTIAL/PLATELET
BASOS ABS: 0 10*3/uL (ref 0.0–0.1)
Basophils Relative: 0 % (ref 0–1)
EOS ABS: 0.2 10*3/uL (ref 0.0–0.7)
EOS PCT: 2 % (ref 0–5)
HEMATOCRIT: 34.7 % — AB (ref 39.0–52.0)
HEMOGLOBIN: 11.2 g/dL — AB (ref 13.0–17.0)
Lymphocytes Relative: 17 % (ref 12–46)
Lymphs Abs: 1.5 10*3/uL (ref 0.7–4.0)
MCH: 29.6 pg (ref 26.0–34.0)
MCHC: 32.3 g/dL (ref 30.0–36.0)
MCV: 91.8 fL (ref 78.0–100.0)
MONO ABS: 0.8 10*3/uL (ref 0.1–1.0)
MONOS PCT: 9 % (ref 3–12)
NEUTROS ABS: 6.2 10*3/uL (ref 1.7–7.7)
Neutrophils Relative %: 72 % (ref 43–77)
Platelets: 235 10*3/uL (ref 150–400)
RBC: 3.78 MIL/uL — ABNORMAL LOW (ref 4.22–5.81)
RDW: 16.2 % — ABNORMAL HIGH (ref 11.5–15.5)
WBC: 8.7 10*3/uL (ref 4.0–10.5)

## 2013-09-24 LAB — PROTIME-INR
INR: 2.77 — ABNORMAL HIGH (ref 0.00–1.49)
Prothrombin Time: 28.3 seconds — ABNORMAL HIGH (ref 11.6–15.2)

## 2013-09-24 LAB — PHOSPHORUS: Phosphorus: 2.9 mg/dL (ref 2.3–4.6)

## 2013-09-24 LAB — MAGNESIUM: MAGNESIUM: 2.2 mg/dL (ref 1.5–2.5)

## 2013-09-24 MED ORDER — POTASSIUM CHLORIDE CRYS ER 20 MEQ PO TBCR
40.0000 meq | EXTENDED_RELEASE_TABLET | Freq: Every day | ORAL | Status: DC
  Administered 2013-09-24 – 2013-09-25 (×2): 40 meq via ORAL
  Filled 2013-09-24 (×2): qty 2

## 2013-09-24 MED ORDER — WARFARIN SODIUM 2.5 MG PO TABS
3.7500 mg | ORAL_TABLET | Freq: Once | ORAL | Status: AC
  Administered 2013-09-24: 3.75 mg via ORAL
  Filled 2013-09-24: qty 1

## 2013-09-24 NOTE — Progress Notes (Signed)
ANTICOAGULATION  CONSULT NOTE - Follow Up Consult  Pharmacy Consult for coumadin Indication: hx DVT  Allergies  Allergen Reactions  . Oxycodone Hcl     REACTION: makes pt "wild"  . Propoxyphene N-Acetaminophen     REACTION: Makes pt. "wild"    Patient Measurements: Height: 6\' 8"  (203.2 cm) Weight: 328 lb 14.8 oz (149.2 kg) IBW/kg (Calculated) : 96   Vital Signs: Temp: 97.9 F (36.6 C) (04/26 0557) Temp src: Oral (04/26 0557) BP: 117/50 mmHg (04/26 0557) Pulse Rate: 57 (04/26 0557)  Labs:  Recent Labs  09/22/13 0535  09/22/13 1512 09/22/13 2228 09/23/13 0405 09/24/13 0420  HGB 12.0*  --   --   --  11.7* 11.2*  HCT 37.0*  --   --   --  36.1* 34.7*  PLT 208  --   --   --  218 235  LABPROT 25.5*  --   --   --  24.9* 28.3*  INR 2.42*  --   --   --  2.34* 2.77*  CREATININE 1.03  --   --   --  1.17 1.23  TROPONINI  --   < > <0.30 <0.30 <0.30  --   < > = values in this interval not displayed.  Estimated Creatinine Clearance: 79.5 ml/min (by C-G formula based on Cr of 1.23).   Assessment: Patient is an 80 y.o M on coumadin for hx DVT and now in afib with RVR.  INR is therapeutic at 2.77 this morning but has increased from yesterday.  No bleeding documented.  Goal of Therapy:  INR 2-3    Plan:  1) Coumadin 3.75mg  today 2)  Cont daily PT/INR   Jeanmarie Mccowen Poteet Ben Habermann 09/24/2013,9:17 AM

## 2013-09-24 NOTE — Progress Notes (Signed)
Patient ID: [name]  male  ZOX:[id number]    DOB: 1932/05/13    DOA: 09/18/2013  PCP: Joel BasemanWONG,FRANCIS PATRICK, MD  Assessment/Plan: Principal Problem:   Acute respiratory failure/acute respiratory distress: Significantly improved, ? Aspiration versus COPD exacerbation versus volume overload.  -Chest x-ray had shown cardiomegaly with increasing interstitial edema, bibasilar airspace disease - Placed on scheduled bronchodilators with Xopenex, Atrovent, Solu-Medrol 125 mg x1,  - Continue IV diuresis, bronchodilators, vancomycin and ZosyN  Active Problems: Atrial fibrillation with RVR: Possibly could have triggered due to #1: Currently having bradycardia issues, with heart rate in 40s to low 60s - New onset atrial fibrillation, with no prior history, confirmed with patient's daughter. He was ruled out for acute ACS - The patient was transferred to step down unit for Cardizem drip. He has converted to sinus rhythm and was placed on oral Cardizem.  - Patient is currently on Coumadin with therapeutic INR of 2.34, hence PE unlikely - TSH 2.4 - continue IV Lasix, 2-D echo showed EF of 60-65%, mild to moderate aortic regurgitation. Cardiology following, decreased Cardizem due to bradycardia.  Weakness, frequent fall;  - PT, OT consult. Recommending skilled nursing facility  - MRI negative for acute stroke, Sulcal effacement at the convexities may be seen with normal pressure hydrocephalus. Patient was seen by neurology, Dr. [name], recommended outpatient neurology workup for neuropathy and dementia. Per neurology, would not evaluate acutely for NPH given knee injury and anticoagulation  - TSH at 2.4., B12 level low    B12 deficiency  - Started on B12 replacement   Bilateral Lower extremities swelling, redness. Patient already on coumadin which cover for DVT. Component of venous insufficiency.  - Continue IV vancomycin for cellulitis   Small nonspecific fluid or infiltration demonstrated  in the right lower quadrant separate from the normal appendix : patient with no abdominal pain, Review scan with radiologist, fluid is non specific.   Right leg pain; and unable to even lift up the right leg, had frequent falls, 2 falls in last week with progressive weakness, history of lumbar spine surgery for disc many years ago - MRI brain negative for ac CVA  - hip x ray and femur x ray negative for fracture, CT right hip negative - CT of the lumbar spine shows multilevel disc and possible degeneration, multilevel spinal stenosis, foramina encroachment bilateral at multiple levels due to spurring - Appreciate orthopedics evaluation, I have ordered a right knee x-ray again, looks swollen  -Hypernatremia: mild, improving  -History of DVT; continue with coumadin.   DVT Prophylaxis: On Coumadin  Code Status: Full code  Family Communication:  Disposition: hopefully SNF in AM  Consultants:  Cardiology- Dr Elease HashimotoNahser  Orthopedics- Dr Lequita HaltAluisio  Pulmonology  Procedures:  Aspiration of the right knee  Antibiotics:  IV vancomycin  IV Zosyn 4/24  Subjective: Patient seen and examined today this morning, heart rate somewhat better, denies any chest pain or shortness of breath, denies any pain in the right foot, right knee appears to be slightly swollen although patient denies any extreme pain.    Objective: Weight change: 0.7 kg (1 lb 8.7 oz)  Intake/Output Summary (Last 24 hours) at 09/24/13 1034 Last data filed at 09/24/13 0432  Gross per 24 hour  Intake   1533 ml  Output   2201 ml  Net   -668 ml   Blood pressure 117/50, pulse 57, temperature 97.9 F (36.6 C), temperature source Oral, resp. rate 16, height 6\' 8"  (2.032 m), weight 149.2  kg (328 lb 14.8 oz), SpO2 90.00%.  Physical Exam: General: Alert and awake, oriented x3,  CVS: Normal sinus rhythm Chest wheezing significantly improved Abdomen: soft nontender, nondistended, normal bowel sounds  Extremities: Bilateral  chronic venous changes, but cellulitis has significantly improved., Right knee somewhat swollen compared to the left   Lab Results: Basic Metabolic Panel:  Recent Labs Lab 09/23/13 0405 09/24/13 0420  NA 146 146  K 4.0 3.3*  CL 110 108  CO2 24 23  GLUCOSE 169* 106*  BUN 29* 32*  CREATININE 1.17 1.23  CALCIUM 8.9 8.2*  MG 2.4 2.2  PHOS 2.9 2.9   Liver Function Tests:  Recent Labs Lab 09/23/13 0405 09/24/13 0420  AST 31 40*  ALT 25 36  ALKPHOS 62 58  BILITOT 0.6 0.5  PROT 6.1 5.7*  ALBUMIN 2.7* 2.6*   No results found for this basename: LIPASE, AMYLASE,  in the last 168 hours No results found for this basename: AMMONIA,  in the last 168 hours CBC:  Recent Labs Lab 09/23/13 0405 09/24/13 0420  WBC 7.4 8.7  NEUTROABS 6.1 6.2  HGB 11.7* 11.2*  HCT 36.1* 34.7*  MCV 92.3 91.8  PLT 218 235   Cardiac Enzymes:  Recent Labs Lab 09/22/13 1512 09/22/13 2228 09/23/13 0405  TROPONINI <0.30 <0.30 <0.30   BNP: No components found with this basename: POCBNP,  CBG: No results found for this basename: GLUCAP,  in the last 168 hours   Micro Results: Recent Results (from the past 240 hour(s))  URINE CULTURE     Status: None   Collection Time    09/18/13  4:32 PM      Result Value Ref Range Status   Specimen Description URINE, CLEAN CATCH   Final   Special Requests NONE   Final   Culture  Setup Time     Final   Value: 09/18/2013 17:21     Performed at [facility]   Colony Count     Final   Value: NO GROWTH     Performed at [facility]   Culture     Final   Value: NO GROWTH     Performed at [facility]   Report Status 09/19/2013 FINAL   Final  MRSA PCR SCREENING     Status: None   Collection Time    09/22/13  9:59 AM      Result Value Ref Range Status   MRSA by PCR NEGATIVE  NEGATIVE Final   Comment:            The GeneXpert MRSA Assay (FDA     approved for NASAL specimens     only), is one component of a     comprehensive  MRSA colonization     surveillance program. It is not     intended to diagnose MRSA     infection nor to guide or     monitor treatment for     MRSA infections.  CULTURE, EXPECTORATED SPUTUM-ASSESSMENT     Status: None   Collection Time    09/22/13 11:35 PM      Result Value Ref Range Status   Specimen Description SPUTUM   Final   Special Requests Normal   Final   Sputum evaluation     Final   Value: THIS SPECIMEN IS ACCEPTABLE. RESPIRATORY CULTURE REPORT TO FOLLOW.   Report Status 09/23/2013 FINAL   Final  CULTURE, RESPIRATORY (NON-EXPECTORATED)     Status: None  Collection Time    09/22/13 11:35 PM      Result Value Ref Range Status   Specimen Description SPUTUM   Final   Special Requests NONE   Final   Gram Stain     Final   Value: ABUNDANT WBC PRESENT,BOTH PMN AND MONONUCLEAR     NO SQUAMOUS EPITHELIAL CELLS SEEN     MODERATE GRAM POSITIVE COCCI IN PAIRS     RARE GRAM NEGATIVE RODS     Performed at [facility]   Culture PENDING   Incomplete   Report Status PENDING   Incomplete    Studies/Results: Ct Abdomen Pelvis Wo Contrast  09/18/2013   CLINICAL DATA:  Lower extremity pain and weakness after a fall today. History of abdominal aortic aneurysm.  EXAM: CT ABDOMEN AND PELVIS WITHOUT CONTRAST  TECHNIQUE: Multidetector CT imaging of the abdomen and pelvis was performed following the standard protocol without intravenous contrast.  COMPARISON:  12/30/2009  FINDINGS: Atelectasis in the lung bases.  Calcification in the aortic valve.  Evaluation of solid organs and vascular structures is limited due to the lack of IV contrast material. Surgical absence of the gallbladder. Biliary gas is likely postoperative. Diffuse fatty infiltration of the pancreas. Unenhanced appearance of the spleen, adrenal glands, inferior vena cava, and retroperitoneal lymph nodes is unremarkable. Multiple low-attenuation lesions in both kidneys, largest in the mid lower pole on the right measuring 6  cm diameter. These are consistent with cysts and were previously identified on the prior study. No hydronephrosis in either kidney. Punctate size calcifications in both kidneys consistent with nonobstructing stones. There is calcification of the abdominal aorta with focal dilatation to maximal AP diameter of 4.2 cm. This appears stable since previous study. No abnormal retroperitoneal fluid collections. The stomach, small bowel, and colon are not abnormally distended. No free air or free fluid in the abdomen. Prominent visceral adipose tissues.  Pelvis: There is a bladder stone measuring 2.1 cm diameter. This is unchanged since previous study. The prostate gland is enlarged, measuring 6.1 x 4.5 cm. No pelvic lymphadenopathy. The appendix is normal. There is a focal fluid or scarring demonstrated in the right lower quadrant but this is adjacent to the cecum and is distant from the appendix. This could represent focal inflammation or nonspecific fluid. No evidence to suggest diverticulitis. Degenerative changes in the lumbar spine. Degenerative changes appear to cause central canal stenosis at at least the L2-3, L3-4, and L4-5 levels. No destructive bone lesions appreciated.  IMPRESSION: Abdominal aortic aneurysm measuring 4.2 cm maximal diameter. Punctate size nonobstructing renal stones bilaterally. Bilateral renal cysts. Fatty infiltration of the pancreas. Normal appendix. Bladder stone. Prostate gland enlargement. Small nonspecific fluid or infiltration demonstrated in the right lower quadrant separate from the normal appendix.   Electronically Signed   By: Burman Nieves M.D.   On: 09/18/2013 22:49   Dg Chest 2 View  09/18/2013   CLINICAL DATA:  Weakness  EXAM: CHEST - 2 VIEW  COMPARISON:  DG CHEST 1V PORT dated 05/31/2008  FINDINGS: Borderline cardiomegaly allowing for AP technique. Vascular congestion. Bibasilar atelectasis. No Kerley B-lines. No pneumothorax. No pleural effusion.  IMPRESSION: Bibasilar  atelectasis.   Electronically Signed   By: Maryclare Bean M.D.   On: 09/18/2013 18:06   Dg Hip Complete Right  09/19/2013   CLINICAL DATA:  Status post fall 2 days ago.  Right hip pain.  EXAM: RIGHT HIP - COMPLETE 2+ VIEW  COMPARISON:  Plain films right hip 05/28/2008.  FINDINGS: Dynamic hip screw is in place for a healed intertrochanteric fracture. Hardware is intact. There is partial visualization of plate and screws for fixation of a right femur fracture. No acute bony or joint abnormality is identified. The left hip is unremarkable.  IMPRESSION: No acute finding. Status post fixation of right hip and femur fractures.   Electronically Signed   By: Drusilla Kanner M.D.   On: 09/19/2013 18:13   Dg Femur Right  09/19/2013   CLINICAL DATA:  Status post fall 2 days ago.  Right femoral pain.  EXAM: RIGHT FEMUR - 2 VIEW  COMPARISON:  Plain films of the right femur 05/28/2008.  FINDINGS: No acute bony or joint abnormality is identified. The patient has a healed distal femur fracture with plate and screws in place. The plate is broken but unchanged. Small knee joint effusion is identified.  IMPRESSION: No acute abnormality. Healed distal femur fracture with hardware in place. Stable compared to prior exam.   Electronically Signed   By: Drusilla Kanner M.D.   On: 09/19/2013 18:16   Dg Tibia/fibula Right  09/18/2013   CLINICAL DATA:  Leg pain and swelling  EXAM: RIGHT TIBIA AND FIBULA - 2 VIEW  COMPARISON:  None.  FINDINGS: Four views of the right tibia-fibula submitted. No acute fracture or subluxation. Mild soft tissue swelling.  IMPRESSION: No acute fracture or subluxation.  Diffuse soft tissue swelling.   Electronically Signed   By: Natasha Mead M.D.   On: 09/18/2013 18:16   Ct Lumbar Spine Wo Contrast  09/20/2013   CLINICAL DATA:  History of lumbar spine surgery. Right leg weakness. Rule out spinal stenosis.  EXAM: CT LUMBAR SPINE WITHOUT CONTRAST  TECHNIQUE: Multidetector CT imaging of the lumbar spine was  performed without intravenous contrast administration. Multiplanar CT image reconstructions were also generated.  COMPARISON:  None.  FINDINGS: Some lumbar CT images were retrospectively acquired from a CT abdomen pelvis on 09/18/2013.  Atherosclerotic aorta with the infrarenal abdominal aortic aneurysm measuring up to 44 x 36 mm. Large bladder stone measures 19 mm.  Negative for lumbar spine fracture or mass. No acute bony change. The lumbar alignment is normal.  T12-L1:  Mild disc and facet degeneration with mild spinal stenosis  L1-2: Disc bulging and facet degeneration with moderate spinal stenosis.  L2-3: Disc desiccation and vacuum disc phenomenon. Disc bulging and spondylosis. Bilateral facet hypertrophy. Moderate to severe spinal stenosis. Neural foraminal encroachment bilaterally  L3-4: Diffuse disc bulging. Bilateral facet and ligamentum flavum hypertrophy. Severe spinal stenosis. Marked lateral recess and foraminal encroachment bilaterally.  L4-5: Laminotomy on the right. Disc degeneration and spondylosis. Bilateral facet hypertrophy. There is foraminal encroachment bilaterally. Spinal canal is adequate in size.  L5-S1: Spondylosis and facet degeneration causing foraminal narrowing bilaterally.  IMPRESSION: Negative for lumbar fracture or mass lesion.  Multilevel disc and facet degeneration. Multilevel spinal stenosis. Foraminal encroachment is present bilaterally at multiple levels due to spurring.  Abdominal aortic aneurysm measuring 44 x 36 mm.  Large bladder stone.   Electronically Signed   By: Marlan Palau M.D.   On: 09/20/2013 18:35   Mr Brain Wo Contrast  09/18/2013   CLINICAL DATA:  Right lower extremity weakness.  EXAM: MRI HEAD WITHOUT CONTRAST  TECHNIQUE: Multiplanar, multiecho pulse sequences of the brain and surrounding structures were obtained without intravenous contrast.  COMPARISON:  None.  FINDINGS: Mild motion degraded examination. No reduced diffusion to suggest acute ischemia. No  susceptibility artifact to suggest hemorrhage. Minimal basal ganglia mineralization.  Moderate to severe ventriculomegaly, likely on the basis of global parenchymal brain volume loss as there is overall commensurate enlargement of cerebellar folia and sulci, though there is sulcal effacement at the convexities. 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 Minimal white matter T2 hyperintensities, less than expected for age suggest chronic small vessel ischemic disease.  No abnormal extra-axial fluid collections. Normal major intracranial vascular flow voids preserved at the skullbase, however there is mild dolichoectasia of the intracranial vessels which can be related to chronic hypertension.  Status post bilateral ocular lens implants. Trace paranasal sinus mucosal thickening without air-fluid levels. Mastoid air cells are well aerated. Mild temporomandibular osteoarthrosis. No abnormal sellar expansion. No cerebellar tonsillar ectopia.  IMPRESSION: No acute intracranial process, specifically no evidence of acute ischemia on this mildly motion degraded examination.  Sulcal effacement at the convexities may be seen with normal pressure hydrocephalus.  Mild white matter changes likely reflect chronic small vessel ischemic disease, less than expected for age in addition to multiple remote left cerebellar infarcts.   Electronically Signed   By: Awilda Metro   On: 09/18/2013 23:33   Ct Hip Right Wo Contrast  09/20/2013   CLINICAL DATA:  Right hip pain and limited range of motion.  EXAM: CT OF THE RIGHT HIP WITHOUT CONTRAST  TECHNIQUE: Multidetector CT imaging was performed according to the standard protocol. Multiplanar CT image reconstructions were also generated.  COMPARISON:  Plain films right hip and femur 09/19/2013. CT abdomen and pelvis 09/18/2013.  FINDINGS: No acute fracture is identified. The patient has a healed right intertrochanteric  fracture with fixation hardware in place. Hardware is intact. The right hip is located. There is no notable degenerative disease about the right hip. No avascular necrosis of the right femoral head is seen. There is no hip joint effusion. All visualized musculature is intact and normal in appearance. Imaged intrapelvic contents demonstrate urinary bladder stone as seen on the comparison CT.  IMPRESSION: Negative for fracture.  No acute abnormality.  Healed right intertrochanteric fracture with intact hardware in place.  Urinary bladder stone.   Electronically Signed   By: Drusilla Kanner M.D.   On: 09/20/2013 09:55   US Venous Img Lower Bilateral  09/08/2013   CLINICAL DATA:  Bilateral lower extremity edema; history of previous DVT in the left leg  EXAM: BILATERAL LOWER EXTREMITY VENOUS DOPPLER ULTRASOUND  TECHNIQUE: Gray-scale sonography with graded compression, as well as color Doppler and duplex ultrasound were performed to evaluate the lower extremity deep venous systems from the level of the common femoral vein and including the common femoral, femoral, profunda femoral, popliteal and calf veins including the posterior tibial, peroneal and gastrocnemius veins when visible. The superficial great saphenous vein was also interrogated. Spectral Doppler was utilized to evaluate flow at rest and with distal augmentation maneuvers in the common femoral, femoral and popliteal veins.  COMPARISON:  None.  FINDINGS: RIGHT LOWER EXTREMITY  Common Femoral Vein: No evidence of thrombus. Normal compressibility, respiratory phasicity and response to augmentation.  Saphenofemoral Junction: No evidence of thrombus. Normal compressibility and flow on color Doppler imaging.  Profunda Femoral Vein: No evidence of thrombus. Normal compressibility and flow on color Doppler imaging.  Femoral Vein: No evidence of thrombus. Normal compressibility, respiratory phasicity and response to augmentation.  Popliteal Vein: No evidence of  thrombus. Normal compressibility, respiratory phasicity and response to augmentation.  Calf Veins: No evidence of thrombus. Normal compressibility and flow on color Doppler  imaging.  Superficial Great Saphenous Vein: No evidence of thrombus. Normal compressibility and flow on color Doppler imaging.  Venous Reflux:  None.  Other Findings: The peroneal vein and anterior tibial vein were not visualized on the right.  LEFT LOWER EXTREMITY  Common Femoral Vein: No evidence of thrombus. Normal compressibility, respiratory phasicity and response to augmentation.  Saphenofemoral Junction: No evidence of thrombus. Normal compressibility and flow on color Doppler imaging.  Profunda Femoral Vein: No evidence of thrombus. Normal compressibility and flow on color Doppler imaging.  Femoral Vein: There is nonocclusive thrombus within the superficial femoral vein.  Popliteal Vein: There is nonocclusive thrombus within the popliteal vein.  Calf Veins: No evidence of thrombus. Normal compressibility and flow on color Doppler imaging.  Superficial Great Saphenous Vein: No evidence of thrombus. Normal compressibility and flow on color Doppler imaging.  Venous Reflux:  None.  Other Findings: The peroneal and anterior tibial veins were not demonstrated on the left.  IMPRESSION: 1. There is no evidence of deep venous thrombosis in the visualized veins of the right lower extremity. 2. On the left there is likely old thrombus which is nonocclusive in the superficial femoral and popliteal veins. 3. The large amount of edema as well as erythema and ulceration of the legs limits the study somewhat.   Electronically Signed   By: David  [place]   On: 09/08/2013 15:47   Dg Chest Port 1 View  09/22/2013   CLINICAL DATA:  Bibasilar atelectasis on previous chest x-ray.  EXAM: PORTABLE CHEST - 1 VIEW  COMPARISON:  Two-view chest x-ray 09/18/2013.  FINDINGS: The heart is enlarged. Mild interstitial edema has increased. Mild bibasilar airspace  disease is similar to the prior exam, likely representing atelectasis or scarring. No significant airspace consolidation is present.  IMPRESSION: 1. Cardiomegaly and increasing interstitial edema suggesting congestive heart failure. 2. Bibasilar airspace disease likely are presents atelectasis or scarring.   Electronically Signed   By: Gennette Pac M.D.   On: 09/22/2013 08:30    Medications: Scheduled Meds: . diltiazem  120 mg Oral Daily  . doxazosin  4 mg Oral QHS  . finasteride  5 mg Oral QHS  . furosemide  40 mg Intravenous BID  . latanoprost  1 drop Both Eyes QHS  . levalbuterol  1.25 mg Nebulization BID  . lidocaine  10 mL Intradermal Once  . piperacillin-tazobactam (ZOSYN)  IV  3.375 g Intravenous 3 times per day  . sodium chloride  3 mL Intravenous Q12H  . vancomycin  1,500 mg Intravenous Q12H  . vitamin B-12  1,000 mcg Oral Daily  . warfarin  3.75 mg Oral ONCE-1800  . Warfarin - Pharmacist Dosing Inpatient   Does not apply q1800      LOS: 6 days   Sahand Gosch Jenna Luo M.D. [facility] 09/24/2013, 10:34 AM Pager: [phone]  If 7PM-7AM, please contact night-coverage [email or web address] Password [email or web address]  **Disclaimer: This note was dictated with voice recognition software. Similar sounding words can inadvertently be transcribed and this note may contain transcription errors which may not have been corrected upon publication of note.**

## 2013-09-25 ENCOUNTER — Inpatient Hospital Stay
Admission: RE | Admit: 2013-09-25 | Discharge: 2013-11-10 | Disposition: A | Discharge status: Home / Self Care | Payer: Medicare Other | Source: Ambulatory Visit | Attending: Internal Medicine | Admitting: Internal Medicine

## 2013-09-25 DIAGNOSIS — R609 Edema, unspecified: Secondary | ICD-10-CM

## 2013-09-25 DIAGNOSIS — R52 Pain, unspecified: Principal | ICD-10-CM

## 2013-09-25 LAB — CULTURE, RESPIRATORY

## 2013-09-25 LAB — BASIC METABOLIC PANEL
BUN: 25 mg/dL — ABNORMAL HIGH (ref 6–23)
CHLORIDE: 106 meq/L (ref 96–112)
CO2: 26 mEq/L (ref 19–32)
Calcium: 8.8 mg/dL (ref 8.4–10.5)
Creatinine, Ser: 1.16 mg/dL (ref 0.50–1.35)
GFR calc non Af Amer: 58 mL/min — ABNORMAL LOW (ref >90)
GFR, EST AFRICAN AMERICAN: 67 mL/min — AB (ref >90)
Glucose, Bld: 100 mg/dL — ABNORMAL HIGH (ref 70–99)
POTASSIUM: 3.9 meq/L (ref 3.7–5.3)
SODIUM: 144 meq/L (ref 137–147)

## 2013-09-25 LAB — CBC WITH DIFFERENTIAL/PLATELET
BASOS PCT: 1 % (ref 0–1)
Basophils Absolute: 0 10*3/uL (ref 0.0–0.1)
EOS ABS: 0.4 10*3/uL (ref 0.0–0.7)
Eosinophils Relative: 6 % — ABNORMAL HIGH (ref 0–5)
HEMATOCRIT: 37.6 % — AB (ref 39.0–52.0)
Hemoglobin: 12 g/dL — ABNORMAL LOW (ref 13.0–17.0)
LYMPHS ABS: 1.3 10*3/uL (ref 0.7–4.0)
Lymphocytes Relative: 20 % (ref 12–46)
MCH: 29.6 pg (ref 26.0–34.0)
MCHC: 31.9 g/dL (ref 30.0–36.0)
MCV: 92.8 fL (ref 78.0–100.0)
MONO ABS: 1.1 10*3/uL — AB (ref 0.1–1.0)
MONOS PCT: 15 % — AB (ref 3–12)
NEUTROS PCT: 58 % (ref 43–77)
Neutro Abs: 4 10*3/uL (ref 1.7–7.7)
Platelets: 252 10*3/uL (ref 150–400)
RBC: 4.05 MIL/uL — AB (ref 4.22–5.81)
RDW: 16.1 % — ABNORMAL HIGH (ref 11.5–15.5)
WBC: 6.8 10*3/uL (ref 4.0–10.5)

## 2013-09-25 LAB — PROTIME-INR
INR: 2.37 — ABNORMAL HIGH (ref 0.00–1.49)
Prothrombin Time: 25.1 seconds — ABNORMAL HIGH (ref 11.6–15.2)

## 2013-09-25 LAB — CULTURE, RESPIRATORY W GRAM STAIN

## 2013-09-25 MED ORDER — CYANOCOBALAMIN 1000 MCG PO TABS: 1000.0000 ug | ORAL_TABLET | Freq: Every day | ORAL | Status: DC

## 2013-09-25 MED ORDER — DOXYCYCLINE HYCLATE 100 MG PO CAPS: 100.0000 mg | ORAL_CAPSULE | Freq: Two times a day (BID) | ORAL | Status: DC

## 2013-09-25 MED ORDER — IPRATROPIUM-ALBUTEROL 0.5-2.5 (3) MG/3ML IN SOLN: 3.0000 mL | Freq: Two times a day (BID) | RESPIRATORY_TRACT | Status: DC

## 2013-09-25 MED ORDER — ALBUTEROL SULFATE HFA 108 (90 BASE) MCG/ACT IN AERS: 2.0000 | INHALATION_SPRAY | Freq: Four times a day (QID) | RESPIRATORY_TRACT | Status: DC | PRN

## 2013-09-25 MED ORDER — POTASSIUM CHLORIDE CRYS ER 20 MEQ PO TBCR: 40.0000 meq | EXTENDED_RELEASE_TABLET | Freq: Every day | ORAL | Status: DC

## 2013-09-25 MED ORDER — DILTIAZEM HCL ER COATED BEADS 120 MG PO CP24: 120.0000 mg | ORAL_CAPSULE | Freq: Every day | ORAL | Status: DC

## 2013-09-25 MED ORDER — TRAMADOL HCL 50 MG PO TABS: 50.0000 mg | ORAL_TABLET | Freq: Four times a day (QID) | ORAL | Status: DC | PRN

## 2013-09-25 MED ORDER — FUROSEMIDE 40 MG PO TABS: 40.0000 mg | ORAL_TABLET | Freq: Two times a day (BID) | ORAL | Status: DC

## 2013-09-25 MED ORDER — HYDROCOD POLST-CHLORPHEN POLST 10-8 MG/5ML PO LQCR: 5.0000 mL | Freq: Two times a day (BID) | ORAL | Status: DC | PRN

## 2013-09-25 MED ORDER — FUROSEMIDE 40 MG PO TABS
40.0000 mg | ORAL_TABLET | Freq: Two times a day (BID) | ORAL | Status: DC
  Filled 2013-09-25 (×2): qty 1

## 2013-09-25 NOTE — Progress Notes (Signed)
CSW spoke with patient's daughter about discharge to a SNF today. Patient's daughter was very flustered and frustrated. CSW attempted to calm the patient's daughter down. Pt's daughter requested that CSW speak with [facility]. The patient's daughter is going to visit the facility today. CSW did inform the patient's daughter that there is a discharge order and summary. Pt's daughter understood.    Sabino NiemannAmy Marquesa Rath, MSW, [employer] [phone]

## 2013-09-25 NOTE — Progress Notes (Signed)
Physical Therapy Treatment Patient Details Name: [name] MRN: [id number] DOB: 03/24/1932 Today's Date: 09/25/2013    History of Present Illness This is a 80 y.o. year old male with noted prior history of chronic venous insufficiency , obesity, COPD, AAA, peripheral vascular disease, hx/o LLE DVT presenting with progressive weakness and falls at home with inability to bear weight on RLE since fall. All RLE imaging and back imaging negative    PT Comments    Pt with improved bed mobility and attempts at standing today and will continue to work toward increased strength and transfers. Decreased limitation of Right leg today but with report of pain with pivot to EOB. Pt educated for HEP and encouraged to continue to attempt to perform throughout the day. Will follow. Pt with incontinence of stool and assist for pericare and linen change  Follow Up Recommendations  SNF;Supervision/Assistance - 24 hour     Equipment Recommendations       Recommendations for Other Services       Precautions / Restrictions Precautions Precautions: Fall Precaution Comments: obese    Mobility  Bed Mobility Overal bed mobility: Needs Assistance Bed Mobility: Supine to Sit     Supine to sit: Min assist     General bed mobility comments: cues for sequence with use of rail and assist to pivot legs to EOB but pt able to elevate trunk on his own today  Transfers Overall transfer level: Needs assistance   Transfers: Sit to/from Stand Sit to Stand: Max assist;+2 physical assistance;From elevated surface;+2 safety/equipment         General transfer comment: performed sit to stand x 5 trials from elevated bed. pt able to fully clear buttock and stand upright x 2 max of 20 sec. 3 other trials able to partially clear buttock but not achieve standing. Max cues and assist for anterior translation and sequence. Pivot bed to chair via maximove  Ambulation/Gait                 Stairs             Wheelchair Mobility    Modified Rankin (Stroke Patients Only)       Balance Overall balance assessment: Needs assistance;History of Falls   Sitting balance-Leahy Scale: Fair       Standing balance-Leahy Scale: Zero                      Cognition Arousal/Alertness: Awake/alert Behavior During Therapy: Flat affect Overall Cognitive Status: Impaired/Different from baseline Area of Impairment: Memory;Safety/judgement;Problem solving;Following commands     Memory: Decreased short-term memory Following Commands: Follows one step commands with increased time Safety/Judgement: Decreased awareness of deficits;Decreased awareness of safety   Problem Solving: Slow processing;Decreased initiation;Difficulty sequencing;Requires verbal cues;Requires tactile cues General Comments: Pt remains unable to stand for any length of time and still asking to walk. unable to recall transfer to and from ICU or reason why    Exercises General Exercises - Lower Extremity Ankle Circles/Pumps: AROM;Seated;Both;10 reps Heel Slides: AROM;Seated;Both;10 reps Straight Leg Raises: AAROM;Seated;Both;10 reps    General Comments        Pertinent Vitals/Pain No pain at rest VSS    Home Living                      Prior Function            PT Goals (current goals can now be found in the care plan section) Progress  towards PT goals: Progressing toward goals (slowly)    Frequency       PT Plan Current plan remains appropriate    Co-evaluation             End of Session Equipment Utilized During Treatment: Gait belt Activity Tolerance: Patient tolerated treatment well Patient left: in chair;with call bell/phone within reach;with chair alarm set     Time: [id number] PT Time Calculation (min): 39 min  Charges:  $Therapeutic Activity: 38-52 mins                    G Codes:      [name] 09/25/2013, 9:45 AM [name], PT [phone]

## 2013-09-25 NOTE — Discharge Summary (Signed)
Physician Discharge Summary  Patient ID: COCHISE DINNEEN MRN: [id number] DOB/AGE: 12-07-1931 80 y.o.  Admit date: 09/18/2013 Discharge date: 09/25/2013  Primary Care Physician:  Redmond Baseman, MD  Discharge Diagnoses:    . Acute hypoxic respiratory failure . Atrial fibrillation with RVR . Pain in joint, right knee and lower leg . generalized Weakness . COPD (chronic obstructive pulmonary disease) . B12 deficiency . Knee effusion, right . Traumatic hematoma of right knee . Chronic venous insufficiency . Cognitive impairment with dementia  . Left leg DVT . Obesity, unspecified  Consults: Cardiology, , Dr. Elease Hashimoto                   Pulmonology/critical care   Recommendations for Outpatient Follow-up:  Please check PT/INR on 09/26/2013  Please refer patient to outpatient neurology for NPH/dementia workup  Allergies:   Allergies  Allergen Reactions  . Oxycodone Hcl     REACTION: makes pt "wild"  . Propoxyphene N-Acetaminophen     REACTION: Makes pt. "wild"     Discharge Medications:   Medication List    STOP taking these medications       torsemide 10 MG tablet  Commonly known as:  DEMADEX      TAKE these medications       albuterol 108 (90 BASE) MCG/ACT inhaler  Commonly known as:  PROVENTIL HFA;VENTOLIN HFA  Inhale 2 puffs into the lungs every 6 (six) hours as needed for wheezing or shortness of breath.     chlorpheniramine-HYDROcodone 10-8 MG/5ML Lqcr  Commonly known as:  TUSSIONEX  Take 5 mLs by mouth every 12 (twelve) hours as needed for cough.     cyanocobalamin 1000 MCG tablet  Take 1 tablet (1,000 mcg total) by mouth daily.     diltiazem 120 MG 24 hr capsule  Commonly known as:  CARDIZEM CD  Take 1 capsule (120 mg total) by mouth daily.     doxazosin 8 MG tablet  Commonly known as:  CARDURA  Take 0.5 tablets (4 mg total) by mouth at bedtime.     doxycycline 100 MG capsule  Commonly known as:  VIBRAMYCIN  Take 1 capsule (100 mg total) by  mouth 2 (two) times daily. X 3 days     finasteride 5 MG tablet  Commonly known as:  PROSCAR  Take 5 mg by mouth at bedtime.     furosemide 40 MG tablet  Commonly known as:  LASIX  Take 1 tablet (40 mg total) by mouth 2 (two) times daily.     ipratropium-albuterol 0.5-2.5 (3) MG/3ML Soln  Commonly known as:  DUONEB  Take 3 mLs by nebulization 2 (two) times daily.     latanoprost 0.005 % ophthalmic solution  Commonly known as:  XALATAN  Place 1 drop into both eyes at bedtime.     potassium chloride SA 20 MEQ tablet  Commonly known as:  K-DUR,KLOR-CON  Take 2 tablets (40 mEq total) by mouth daily.     traMADol 50 MG tablet  Commonly known as:  ULTRAM  Take 1 tablet (50 mg total) by mouth every 6 (six) hours as needed for severe pain.     warfarin 7.5 MG tablet  Commonly known as:  COUMADIN  Take 3.75-7.5 mg by mouth daily at 6 PM. Take 1/2(3.75) tablet on Monday Wednesday Friday and  7.5mg  Sunday Tuesday Thursday Saturday         Brief H and P: For complete details please refer to admission H and P, but  in briefHistory of Present Illness:This is a 80 y.o. year old male with noted prior history of chronic venous insufficiency, obesity, COPD, AAA, peripheral vascular disease, hx/o DVT presenting with progressive weakness and falls at home. Pt has had a relatively protacted course at home per family. Pt lives at home with his wife who is the same age. Pt was diagnosed with LLE DVT 05/2013. Was on coumadin for 3 months. Family states that pt had progressive pain and swelling in LLE. Had repeat LE doppler 08/2013 that showed old clot burden in L venous system. Pt was restarted on coumadin. Family states that during the course of the first DVT, patient remained relatively normal wall asked to avoid any risk of falling. Family states this is since this point patient has had progressive lower extremity weakness with recurrent falls at home. Patient had one episode of confirmed head trauma  that was mild. Today, patient fell at home with the inability to bear weight on the right side. There is no reported numbness or paresthesias. Patient denied any chest pain, shortness of breath, orthopnea prior to symptoms, though patient is a relatively poor historian.  Family felt unsafe, taking pt home   Hospital Course:  Patient is a 80 year old male with multiple medical problems, had presented on 09/18/2013 with complaints of fall, generalized weakness and general failure to thrive, chronic lower extremity DVT and COPD. On 4/24, he developed new atrial fibrillation with RVR and acute respiratory distress possibly due to volume overload and was transferred to step down unit Acute respiratory failure/acute respiratory distress:  resolved Aspiration versus COPD exacerbation versus volume overload.Chest x-ray had shown cardiomegaly with increasing interstitial edema, bibasilar airspace disease. Patient was placed on scheduled bronchodilators with Xopenex, Atrovent, Solu-Medrol 125 mg x1 was given. He was also placed on IV Lasix for diuresis. Patient was already on IV vancomycin for lower extremity cellulitis and Zosyn was added. He is on day 4 of IV Zosyn. His coronary status has significantly improved hence will continue doxycycline for 3 days to complete a full course of antibiotics.     Atrial fibrillation with RVR: Possibly could have triggered due to #1: Currently having bradycardia issues, with heart rate in 40s to low 60s - New onset atrial fibrillation, with no prior history, confirmed with patient's daughter. He was ruled out for acute ACS. The patient was transferred to step down unit for Cardizem drip. He has converted to sinus rhythm and was placed on oral Cardizem.  Patient is currently on Coumadin with therapeutic INR. INR is 2.3 today on 09/25/2013. TSH 2.4  2-D echo showed EF of 60-65%, mild to moderate aortic regurgitation. Continue Coumadin, patient has outpatient appointment with Dr.  Mayford Knife on 10/12/2013.  Weakness, frequent fall;   PT, OT consult were obtained and recommended skilled nursing facility. MRI negative for acute stroke, Sulcal effacement at the convexities may be seen with normal pressure hydrocephalus. Patient was seen by neurology, Dr. Amada Jupiter, recommended outpatient neurology workup for neuropathy and dementia. Per neurology, would not evaluate acutely for NPH given knee injury and anticoagulation. TSH at 2.4., B12 level low,hence patient was started on B12 replacement.    B12 deficiency  - Started on B12 replacement , check vitamin B12 in 4 weeks.  Bilateral Lower extremities swelling, redness. Patient already on coumadin which cover for DVT. Component of venous insufficiency.  patient was placed on IV vancomycin for cellulitis. At this time cellulitis has significantly resolved. Patient has chronic venous insufficiency. He will continue doxycycline to  complete a course.   Small nonspecific fluid or infiltration demonstrated in the right lower quadrant separate from the normal appendix : patient with no abdominal pain, Review scan with radiologist, fluid is non specific.   Right leg pain; and unable to even lift up the right leg, had frequent falls, 2 falls in last week with progressive weakness, history of lumbar spine surgery for disc many years ago  - MRI brain negative for ac CVA . hip x ray and femur x ray negative for fracture, CT right hip negative for any occult fracture.  CT of the lumbar spine shows multilevel disc and possible degeneration, multilevel spinal stenosis, foramina encroachment bilateral at multiple levels due to spurring. Orthopedics was consulted patient had right knee arthrocentesis done. Orthopedics and noting patient has any septic knee arthritis. Repeat x-ray was obtained which showed no joint effusion. He will continue physical therapy  -Hypernatremia: Mild improved.  -History of DVT; continue with coumadin.    Day of  Discharge BP 127/81  Pulse 84  Temp(Src) 98.7 F (37.1 C) (Oral)  Resp 18  Ht 6\' 8"  (2.032 m)  Wt 143.6 kg (316 lb 9.3 oz)  BMI 34.78 kg/m2  SpO2 92%  Physical Exam:  General: Alert and awake, oriented x3,  CVS: Normal sinus rhythm  Chest: wheezing significantly improved  Abdomen: soft nontender, nondistended, normal bowel sounds  Extremities: Bilateral chronic venous changes, but cellulitis has significantly improved., Right knee somewhat swollen compared to the left but has significantly improved    The results of significant diagnostics from this hospitalization (including imaging, microbiology, ancillary and laboratory) are listed below for reference.    LAB RESULTS: Basic Metabolic Panel:  Recent Labs Lab 09/24/13 0420 09/25/13 0430  NA 146 144  K 3.3* 3.9  CL 108 106  CO2 23 26  GLUCOSE 106* 100*  BUN 32* 25*  CREATININE 1.23 1.16  CALCIUM 8.2* 8.8  MG 2.2  --   PHOS 2.9  --    Liver Function Tests:  Recent Labs Lab 09/23/13 0405 09/24/13 0420  AST 31 40*  ALT 25 36  ALKPHOS 62 58  BILITOT 0.6 0.5  PROT 6.1 5.7*  ALBUMIN 2.7* 2.6*   No results found for this basename: LIPASE, AMYLASE,  in the last 168 hours No results found for this basename: AMMONIA,  in the last 168 hours CBC:  Recent Labs Lab 09/24/13 0420 09/25/13 0430  WBC 8.7 6.8  NEUTROABS 6.2 4.0  HGB 11.2* 12.0*  HCT 34.7* 37.6*  MCV 91.8 92.8  PLT 235 252   Cardiac Enzymes:  Recent Labs Lab 09/22/13 2228 09/23/13 0405  TROPONINI <0.30 <0.30   BNP: No components found with this basename: POCBNP,  CBG: No results found for this basename: GLUCAP,  in the last 168 hours  Significant Diagnostic Studies:  Ct Abdomen Pelvis Wo Contrast  09/18/2013   CLINICAL DATA:  Lower extremity pain and weakness after a fall today. History of abdominal aortic aneurysm.  EXAM: CT ABDOMEN AND PELVIS WITHOUT CONTRAST  TECHNIQUE: Multidetector CT imaging of the abdomen and pelvis was performed  following the standard protocol without intravenous contrast.  COMPARISON:  12/30/2009  FINDINGS: Atelectasis in the lung bases.  Calcification in the aortic valve.  Evaluation of solid organs and vascular structures is limited due to the lack of IV contrast material. Surgical absence of the gallbladder. Biliary gas is likely postoperative. Diffuse fatty infiltration of the pancreas. Unenhanced appearance of the spleen, adrenal glands, inferior vena cava,  and retroperitoneal lymph nodes is unremarkable. Multiple low-attenuation lesions in both kidneys, largest in the mid lower pole on the right measuring 6 cm diameter. These are consistent with cysts and were previously identified on the prior study. No hydronephrosis in either kidney. Punctate size calcifications in both kidneys consistent with nonobstructing stones. There is calcification of the abdominal aorta with focal dilatation to maximal AP diameter of 4.2 cm. This appears stable since previous study. No abnormal retroperitoneal fluid collections. The stomach, small bowel, and colon are not abnormally distended. No free air or free fluid in the abdomen. Prominent visceral adipose tissues.  Pelvis: There is a bladder stone measuring 2.1 cm diameter. This is unchanged since previous study. The prostate gland is enlarged, measuring 6.1 x 4.5 cm. No pelvic lymphadenopathy. The appendix is normal. There is a focal fluid or scarring demonstrated in the right lower quadrant but this is adjacent to the cecum and is distant from the appendix. This could represent focal inflammation or nonspecific fluid. No evidence to suggest diverticulitis. Degenerative changes in the lumbar spine. Degenerative changes appear to cause central canal stenosis at at least the L2-3, L3-4, and L4-5 levels. No destructive bone lesions appreciated.  IMPRESSION: Abdominal aortic aneurysm measuring 4.2 cm maximal diameter. Punctate size nonobstructing renal stones bilaterally. Bilateral  renal cysts. Fatty infiltration of the pancreas. Normal appendix. Bladder stone. Prostate gland enlargement. Small nonspecific fluid or infiltration demonstrated in the right lower quadrant separate from the normal appendix.   Electronically Signed   By: Burman NievesWilliam  Stevens M.D.   On: 09/18/2013 22:49   Dg Chest 2 View  09/18/2013   CLINICAL DATA:  Weakness  EXAM: CHEST - 2 VIEW  COMPARISON:  DG CHEST 1V PORT dated 05/31/2008  FINDINGS: Borderline cardiomegaly allowing for AP technique. Vascular congestion. Bibasilar atelectasis. No Kerley B-lines. No pneumothorax. No pleural effusion.  IMPRESSION: Bibasilar atelectasis.   Electronically Signed   By: Maryclare BeanArt  Hoss M.D.   On: 09/18/2013 18:06   Dg Hip Complete Right  09/19/2013   CLINICAL DATA:  Status post fall 2 days ago.  Right hip pain.  EXAM: RIGHT HIP - COMPLETE 2+ VIEW  COMPARISON:  Plain films right hip 05/28/2008.  FINDINGS: Dynamic hip screw is in place for a healed intertrochanteric fracture. Hardware is intact. There is partial visualization of plate and screws for fixation of a right femur fracture. No acute bony or joint abnormality is identified. The left hip is unremarkable.  IMPRESSION: No acute finding. Status post fixation of right hip and femur fractures.   Electronically Signed   By: Drusilla Kannerhomas  Dalessio M.D.   On: 09/19/2013 18:13   Dg Femur Right  09/19/2013   CLINICAL DATA:  Status post fall 2 days ago.  Right femoral pain.  EXAM: RIGHT FEMUR - 2 VIEW  COMPARISON:  Plain films of the right femur 05/28/2008.  FINDINGS: No acute bony or joint abnormality is identified. The patient has a healed distal femur fracture with plate and screws in place. The plate is broken but unchanged. Small knee joint effusion is identified.  IMPRESSION: No acute abnormality. Healed distal femur fracture with hardware in place. Stable compared to prior exam.   Electronically Signed   By: Drusilla Kannerhomas  Dalessio M.D.   On: 09/19/2013 18:16   Dg Tibia/fibula  Right  09/18/2013   CLINICAL DATA:  Leg pain and swelling  EXAM: RIGHT TIBIA AND FIBULA - 2 VIEW  COMPARISON:  None.  FINDINGS: Four views of the right tibia-fibula submitted. No  acute fracture or subluxation. Mild soft tissue swelling.  IMPRESSION: No acute fracture or subluxation.  Diffuse soft tissue swelling.   Electronically Signed   By: Natasha MeadLiviu  Pop M.D.   On: 09/18/2013 18:16   Mr Brain Wo Contrast  09/18/2013   CLINICAL DATA:  Right lower extremity weakness.  EXAM: MRI HEAD WITHOUT CONTRAST  TECHNIQUE: Multiplanar, multiecho pulse sequences of the brain and surrounding structures were obtained without intravenous contrast.  COMPARISON:  None.  FINDINGS: Mild motion degraded examination. No reduced diffusion to suggest acute ischemia. No susceptibility artifact to suggest hemorrhage. Minimal basal ganglia mineralization.  Moderate to severe ventriculomegaly, likely on the basis of global parenchymal brain volume loss as there is overall commensurate enlargement of cerebellar folia and sulci, though there is sulcal effacement at the convexities. 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 Minimal white matter T2 hyperintensities, less than expected for age suggest chronic small vessel ischemic disease.  No abnormal extra-axial fluid collections. Normal major intracranial vascular flow voids preserved at the skullbase, however there is mild dolichoectasia of the intracranial vessels which can be related to chronic hypertension.  Status post bilateral ocular lens implants. Trace paranasal sinus mucosal thickening without air-fluid levels. Mastoid air cells are well aerated. Mild temporomandibular osteoarthrosis. No abnormal sellar expansion. No cerebellar tonsillar ectopia.  IMPRESSION: No acute intracranial process, specifically no evidence of acute ischemia on this mildly motion degraded examination.  Sulcal effacement at the convexities may  be seen with normal pressure hydrocephalus.  Mild white matter changes likely reflect chronic small vessel ischemic disease, less than expected for age in addition to multiple remote left cerebellar infarcts.   Electronically Signed   By: Awilda Metroourtnay  Bloomer   On: 09/18/2013 23:33    2D ECHO: Study Conclusions  - Left ventricle: The cavity size was mildly dilated. Wall thickness was increased in a pattern of moderate LVH. Systolic function was normal. The estimated ejection fraction was in the range of 60% to 65%. Wall motion was normal; there were no regional wall motion abnormalities. - Aortic valve: Mild to moderate regurgitation. Valve area: 2.56cm^2(VTI). Valve area: 2.33cm^2 (Vmax). - Left atrium: The atrium was moderately dilated. - Right atrium: The atrium was mildly dilated.    Disposition and Follow-up:     Discharge Orders   Future Appointments Provider Department Dept Phone   10/10/2013 11:15 AM Larina Earthlyodd F Early, MD [facility] [phone]   10/12/2013 9:15 AM Quintella Reichertraci R Turner, MD [facility] [address] [phone]   03/06/2014 9:00 AM [facility] [place] [facility] [address] [phone]   Eat a light meal the night before the exam Nothing to eat or drink for at least 8 hours before exam No gum chewing, or smoking the morning of the exam. Please take your morning medications with small sips of water, especially blood pressure medication *Very Important* Please wear 2 piece clothing   03/06/2014 9:40 AM Carma LairSuzanne L Nickel, NP [facility] [phone]   Future Orders Complete By Expires   Diet - low sodium heart healthy  As directed    Increase activity slowly  As directed        DISPOSITION: skilled  Nursing facility  DIET: heart healthy diet   labs to be followed : PT/INR   DISCHARGE FOLLOW-UP Follow-up Information   Follow up with Quintella ReichertURNER,TRACI R, MD On 10/12/2013. (at 9:15 AM)    Specialty:   Cardiology  Contact information:   [address] [place] [address] [phone]       Follow up with Loanne Drilling, MD. Schedule an appointment as soon as possible for a visit in 2 weeks. (for hospital follow-up, As needed)    Specialty:  Orthopedic Surgery   Contact information:   [address] [place] [address] [phone]       Follow up with Redmond Baseman, MD. Schedule an appointment as soon as possible for a visit in 2 weeks. (for hospital follow-up)    Specialty:  Family Medicine   Contact information:   [address] [place] [address] [phone]       Time spent on Discharge:  40 minutes   Signed:   Cathren Harsh M.D. [facility] 09/25/2013, 12:05 PM Pager: [phone]   **Disclaimer: This note was dictated with voice recognition software. Similar sounding words can inadvertently be transcribed and this note may contain transcription errors which may not have been corrected upon publication of note.**

## 2013-09-25 NOTE — Progress Notes (Signed)
Pt transferred to 3W. Unit CSW provided handoff. This CSW signing off.   Maryclare LabradorJulie Sava Proby, MSW, [facility] Clinical Social Worker [phone]

## 2013-09-25 NOTE — Progress Notes (Signed)
Clinical social worker assisted with patient discharge to skilled nursing facility, [facility].  CSW addressed all family questions and concerns. CSW copied chart and added all important documents. CSW also set up patient transportation with [facility]. Clinical Social Worker will sign off for now as social work intervention is no longer needed.   Sabri Teal, MSW, LCSWA [phone] 

## 2013-09-26 ENCOUNTER — Other Ambulatory Visit: Payer: Self-pay | Admitting: *Deleted

## 2013-09-26 ENCOUNTER — Encounter: Payer: Self-pay | Admitting: Internal Medicine

## 2013-09-26 ENCOUNTER — Non-Acute Institutional Stay (SKILLED_NURSING_FACILITY): Payer: Medicare Other | Admitting: Internal Medicine

## 2013-09-26 DIAGNOSIS — E870 Hyperosmolality and hypernatremia: Secondary | ICD-10-CM

## 2013-09-26 DIAGNOSIS — Z86718 Personal history of other venous thrombosis and embolism: Secondary | ICD-10-CM

## 2013-09-26 DIAGNOSIS — E538 Deficiency of other specified B group vitamins: Secondary | ICD-10-CM

## 2013-09-26 DIAGNOSIS — I509 Heart failure, unspecified: Secondary | ICD-10-CM

## 2013-09-26 DIAGNOSIS — F0390 Unspecified dementia without behavioral disturbance: Secondary | ICD-10-CM

## 2013-09-26 DIAGNOSIS — I4891 Unspecified atrial fibrillation: Secondary | ICD-10-CM

## 2013-09-26 DIAGNOSIS — J449 Chronic obstructive pulmonary disease, unspecified: Secondary | ICD-10-CM

## 2013-09-26 HISTORY — DX: Heart failure, unspecified: I50.9

## 2013-09-26 MED ORDER — TRAMADOL HCL 50 MG PO TABS: 50.0000 mg | ORAL_TABLET | Freq: Four times a day (QID) | ORAL | Status: DC | PRN

## 2013-09-26 NOTE — Telephone Encounter (Signed)
[facility] 

## 2013-09-26 NOTE — Progress Notes (Signed)
Patient ID: [name], male   DOB: Oct 04, 1931, 80 y.o.   MRN: [id number]   This is an acute visit.  Level care skilled.  Facility [facility].  Chief complaint acute visit followup hypernatremia-anticoagulation with history of A. fib and DVT.  History of present illness.  Patient is an 80 -year-old male which has come to the facility as a yesterday for rehabilitation-after a hospitalization for generalized weakness failure to thrive.  His hospital course became quite complicated when he developed atrial fibrillation with rapid ventricular rate in acute respiratory distress thought possibly due to volume overload.  This was thought to be caused by aspiration versus COPD exasperation versus volume overload-chest x-ray showed increasing interstitial edema bibasilar airspace disease-he was treated with nebulizers Solu Medrol x1-also IV Lasix.  Patient also had lower extremity cellulitis that was already being treated with IV vancomycin as well as Zosyn.  His status apparently improved significantly he continues on doxycycline for a full course of antibiotics.  In regard to his atrial fibrillation had no prior history before his hospitalization.  He did receive a Cardizem drip converted to sinus rhythm and is now on oral Cardizem.  Of note he is on Coumadin INR today is therapeutic at 2.61.  He did have a 2-D echo that showed ejection fraction of 6065% with mild to moderate aortic regurgitation.  He has followup with cardiology on May 14.  In regards to his weakness recommendation was made for skilled nursing-MRI was negative for any acute stroke-patient was seen by neurology who recommended outpatient neurology workup for neuropathy and dementia-he also was started on B12.  Patient does have bilateral lower extremity swelling apparently this is chronic thought to have a component of venous insufficiency-he also was started on IV vancomycin in the hospital for cellulitis-apparently this  resolved fairly unremarkable he.  He does have a history of left lower leg DVT and is on Coumadin for this as well.  Currently he denies any shortness of breath beyond his baseline does not complaining of any chest pain vital signs appear to be stable.  His weight today is 318 apparently he was 323 in the hospital per patient's report.  He is on significant dose of Lasix 40 mg twice a day labs were obtained today which is significant for a sodium of 151 BUN is 19 creatinine 0.98.  It appeared his recent sodiums in the hospital were more in the mid 140s I see 149 as the highest.  Previous medical history.  Acute hypoxic respiratory failure thought combination of factors for volume overload question COPD.  Atrial fibrillation with rapid ventricular rate currently controlled.  Pain in joint right knee and lower leg-generalized weakness.--Hospital was negative for any acute fractures CT of lumbar spine showed multilevel disc disease with spinal stenosis-he had a right knee arthrocentesis done fairly did not show any infection.    COPD.  B12 deficiency.  Right knee effusion.  Traumatic hematoma of right knee.  Chronic venous insufficiency.  Dementia with cognitive impairment.  Left leg DVT.  Obesity.  Medications.  Albuterol inhaler 2 puffs in the lungs there were 6 hours when necessary.  Vitamin B12 1000 mcg daily.  Cardizem CD 120 mg daily.  Cardura 4 mg each bedtime.  Doxycycline 100 mg twice a day x2 additional days.  Proscar 5 mg each bedtime.  Lasix 40 mg twice a day.  Duo nebs twice a day.  Xalatan eyedrops one drop both eyes each bedtime.  Potassium 40 mEq daily.  Tramadol  50 mg every 6 hours when necessary.  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  Family history somewhat limited father with a history of cancer  Hospital studies   4-24th 2015-2-D echo did show an ejection fraction left ventricle 60-65% also mild aortic regurgitation  09/18/2013-MR of the brain did not show any acute process.  There were 22,015-abdominal CT dad did show an abdominal aortic aneurysm measuring 4.2 cm maximal diameter.  09/19/2013-right femur x-ray no acute bony or joint abnormality-he has a healed distal femur fracture with plate and screws in place that the plate is broken but unchanged-her  Review of systems per  Gen. no complaints of fever chills weight appears to be stable to possibly slowly declining.  Skin --bleeding antibiotic for lower extremity cellulitis this appears to be resolving.  Head ears eyes nose mouth and throat-does not complaining of any visual changes or sore throat.  Respiratory-does not complaining of any increased shortness of breath from baseline does not complain of cough.  Cardiac no complaints of chest pain or palpitations apparently has some chronic lower extremity edema.  GI does not complaining of any nausea vomiting diarrhea constipation or abdominal pain.  GU does not complaining of dysuria.  Muscle skeletal has significant lower extremity weakness especially in the right is not complaining specifically of pain this afternoon.  Neurologic does not complaining of headache or dizziness although apparently does have a history of chronic headaches.  Psych does have some history of dementia I suspect this is quite mild with cognitive impairment.  Physical exam.  Temperature 98.2 pulse 62 respirations 20 blood pressure 143/59 weight is 318.  In general this is a well-nourished elderly male in no distress lying comfortably in bed.  His skin is warm and dry he does have venous stasis changes of his lower shins bilaterally I do see what appears to be some resolving erythema here apparently this is  resolving fairly unremarkable  Oropharynx is clear mucous membranes moist.  Chest-minimal amount of expiratory wheezing lower right base otherwise clear to auscultation no labored breathing.  Heart is regular rate and rhythm without murmur gallop or rub he has venous stasis changes lower extremities 2+ edema according to patient and wife this is actually improved from the hospital.  Abdomen is obese soft nontender with positive bowel sounds he does have some violaceous bruising I suspect secondary to Lovenox shots--mid abdomen  Muscle skeletal moves all extremities x4 [place] have some lower extremity weakness more so in the right leg.  Neurologic is grossly intact did not see any lateralizing findings cranial nerves are grossly intact speech is clear.  Psych he appears alert and oriented although apparently does have some history of mild dementia    with confusion at times--he was pleasant and appropriate.   Labs.  09/26/2013.  WBC 7.7 hemoglobin 11.7 platelets 269.  Sodium 151 potassium 3.8 BUN 19 creatinine 0.98  09/20/2013.  Sodium 149 potassium 3.4 BUN 18 creatinine 0.98.  Liver function tests within normal limits except AST minimally elevated at 42 albumin low at 3.1.  09/20/2013-INR 1.91.  09/19/2013-hemoglobin A1c 5.8.  Vitamin B12 level was 192.  TSH 1.29.  [name].  Assessment and plan.  #1-history of atrial fibrillation-this appears to be rate controlled currently on Cardizem he is also on Coumadin  for anticoagulations INR is therapeutic we'll recheck this later in the week.--Appears this was quite stable the last few days of his hospitalization  #2-history of volume overload question CHF-he is on fairly aggressive regimen of Lasix. His sodium is slowly going up-at this point will hold the evening dose of Lasix and monitor his weights daily-will have followup by Dr. Leanord Hawkingobson tomorrow as clinically he appears stable in this regard I did speak with his wife who  stated that he had been on "a fluid pill" previously but at only 10 mg a day--appears this may been Demadex per chart review.--Will update metabolic panel tomorrow--continue to hold Lasix until evaluated by Dr. Ala Bentobson tomorrow--also will hold his potassium--this was discussed with Dr. Leanord Hawkingobson via phone  #3- cellulitis-this appears to be largely resolving of his lower extremities.  He is finishing a course of doxycycline.  #4-history COPD he is on nebulizers there is a small amount of wheezing on exam although this is not appear acutely concerning certainly at this point continue to monitor.  #5-history of left leg DVT again he continues on Coumadin edema both legs appears to be equal on exam INRs therapeutic .  #6- history of BPH he continues on Proscar.  #7 history of glaucoma he continues on topical eyedrops.   history of vitamin B12 deficiency level was low at hospital he is on supplementation.  #9-dementia-this appears to mild/moderate recommendation for followup by neurology    [id number]--of note greater than 35 minutes spent assessing patient-discussion patient's status with nursing staff family as well as extensive review of medical records-and coordinating plan of care-of note greater than 50% of time spent coordinating plan of care    .  [name]. .[name]

## 2013-09-27 ENCOUNTER — Non-Acute Institutional Stay (SKILLED_NURSING_FACILITY): Payer: Medicare Other | Admitting: Internal Medicine

## 2013-09-27 DIAGNOSIS — E870 Hyperosmolality and hypernatremia: Secondary | ICD-10-CM

## 2013-09-27 DIAGNOSIS — J189 Pneumonia, unspecified organism: Secondary | ICD-10-CM

## 2013-09-27 DIAGNOSIS — I4891 Unspecified atrial fibrillation: Secondary | ICD-10-CM

## 2013-09-27 DIAGNOSIS — M25469 Effusion, unspecified knee: Secondary | ICD-10-CM

## 2013-09-29 ENCOUNTER — Encounter: Payer: Self-pay | Admitting: Internal Medicine

## 2013-09-29 ENCOUNTER — Non-Acute Institutional Stay (SKILLED_NURSING_FACILITY): Payer: Medicare Other | Admitting: Internal Medicine

## 2013-09-29 DIAGNOSIS — E870 Hyperosmolality and hypernatremia: Secondary | ICD-10-CM

## 2013-09-29 DIAGNOSIS — I4891 Unspecified atrial fibrillation: Secondary | ICD-10-CM

## 2013-09-29 DIAGNOSIS — Z7901 Long term (current) use of anticoagulants: Secondary | ICD-10-CM

## 2013-09-29 DIAGNOSIS — I509 Heart failure, unspecified: Secondary | ICD-10-CM

## 2013-09-29 NOTE — Progress Notes (Signed)
Patient ID: [name], male   DOB: [birth year], 80 y.o.   MRN: [id number]   This is an acute visit.  Level care skilled.  [facility]    Chief complaint acute visit followup hypernatremia-anticoagulation with history of A. fib and DVT .  History of present illness .  Patient is an 80 -year-old male which has come  for rehabilitation-after a hospitalization for generalized weakness failure to thrive.  His hospital course became quite complicated when he developed atrial fibrillation with rapid ventricular rate in acute respiratory distress thought possibly due to volume overload.  This was thought to be caused by aspiration versus COPD exasperation versus volume overload-chest x-ray showed increasing interstitial edema bibasilar airspace disease-he was treated with nebulizers Solu Medrol x1-also IV Lasix.  Patient also had lower extremity cellulitis that was already being treated with IV vancomycin as well as Zosyn.  His status apparently improved significantly he continues on doxycycline for a full course of antibiotics.  In regard to his atrial fibrillation had no prior history before his hospitalization.  He did receive a Cardizem drip converted to sinus rhythm and is now on oral Cardizem.  Of note he is on Coumadin INR today is therapeutic at 2.52.--It had been slightly supratherapeutic yesterday at 3.16-he is on 7.5 mg on Sunday Tuesday Thursday and Saturday and 3.75 mg on Monday Wednesday and Friday-however secondary to the higher INR yesterday he received the 3.75 mg last night and again this appears to have normalized  He did have a 2-D echo that showed ejection fraction of 60--65% with mild to moderate aortic regurgitation.  He has followup with cardiology on May 14.   .  Patient does have bilateral lower extremity swelling apparently this is chronic thought to have a component of venous insufficiency-he also was started on IV vancomycin in the hospital for cellulitis-apparently  this resolved fairly unremarkably .  He does have a history of left lower leg DVT and is on Coumadin for this as well  Acute issue recently has been hypernatremia-he was on Lasix 40 mg twice a day apparently there was some suspicion of fluid overload in the hospital--however it appears shortly after his arrival here her sodium level was 151-his Lasix has been held since April 28-his sodium is trending down currently 146.  Appears his weight has been stable most recently 319.5 apparently was 323 when he left the hospital Earlier weight  Of 318 here in the facility---.  [name].  Currently he denies any shortness of breath beyond his baseline does not complaining of any chest pain vital signs appear to be stable .    It appeared his recent sodiums in the hospital were more in the mid 140s I see 149 as the highest.   Previous medical history.  Acute hypoxic respiratory failure thought combination of factors for volume overload question COPD.  Atrial fibrillation with rapid ventricular rate currently controlled.  Pain in joint right knee and lower leg-generalized weakness.--Hospital was negative for any acute fractures CT of lumbar spine showed multilevel disc disease with spinal stenosis-he had a right knee arthrocentesis done fairly did not show any infection.  COPD.  B12 deficiency.  Right knee effusion.  Traumatic hematoma of right knee.  Chronic venous insufficiency.  Dementia with cognitive impairment.  Left leg DVT.  Obesity.   Medications.  Albuterol inhaler 2 puffs in the lungs there were 6 hours when necessary.  Vitamin B12 1000 mcg daily.  Cardizem CD 120 mg daily.  Cardura 4  mg each bedtime.  Doxycycline 100 mg twice a day x2 additional days.  Proscar 5 mg each bedtime. .  Duo nebs twice a day.  Xalatan eyedrops one drop both eyes each bedtime.  Tramadol 50 mg every 6 hours when necessary.  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   Family history somewhat limited father with a history of cancer   Hospital studies  4-24th 2015-2-D echo did show an ejection fraction left ventricle 60-65% also mild aortic regurgitation  09/18/2013-MR of the brain did not show any acute process.  There were 22,015-abdominal CT dad did show an abdominal aortic aneurysm measuring 4.2 cm maximal diameter.  09/19/2013-right femur x-ray no acute bony or joint abnormality-he has a healed distal femur fracture with plate and screws in place that the plate is broken but unchanged-  Review of systems   Gen. no complaints of fever chills weight appears to be stable .  Skin --on antibiotic for lower extremity cellulitis this appears to be resolving--no complaints of increased bruising or bleeding.  Head ears eyes nose mouth and throat-does not complaining of any visual changes or sore throat.  Respiratory-does not complaining of any increased shortness of breath from baseline does not complain of cough.  Cardiac no complaints of chest pain or palpitations apparently has some chronic lower extremity edema.  GI does not complaini of any nausea vomiting diarrhea constipation or abdominal pain.  GU does not complaining of dysuria.  Muscle skeletal has significant lower extremity weakness especially in the right is not complaining specifically of pain this afternoon.  Neurologic does not complaining of headache or dizziness although apparently does have a history of chronic headaches.  Psych does have some history of dementia I suspect this is quite mild with cognitive impairment .  Physical exam .  Temperature 98.0 pulse 65 respirations 20 blood pressure 109/47 weight is 319.5 .  In general this is a well-nourished elderly male in no distress sitting in his wheelchair.  His  skin is warm and dry he does have venous stasis changes of his lower shins bilaterally I do see what appears to be some resolving erythema here apparently this is resolving fairly unremarkable  Oropharynx is clear mucous membranes moist. Eyes-sclera and conjunctiva are clear visual acuity appears grossly intact  Chest-somewhat shallow air entry otherwise clear to auscultation no labored breathing.  Heart is regular rate and rhythm without murmur gallop or rub he has venous stasis changes lower extremities 2+ edema this is relatively baseline with exam earlier this week  Abdomen is obese soft nontender with positive bowel sounds he does have some violaceous bruising I suspect secondary to Lovenox shots--mid abdomen   .  Neurologic is grossly intact did not see any lateralizing findings cranial nerves are grossly intact speech is clear.  Psych he appears alert and oriented although apparently does have some history of mild dementia  with confusion at times--he was pleasant and appropriate .  Labs  09/29/2013.  INR 2.52.  Sodium 146 potassium 4.1 BUN 19 creatinine 1.08.  .  09/26/2013.  WBC 7.7 hemoglobin 11.7 platelets 269.  Sodium 151 potassium 3.8 BUN 19 creatinine 0.98   09/20/2013.  Sodium 149 potassium 3.4 BUN 18 creatinine 0.98.  Liver function tests within normal limits except AST minimally elevated  at 42 albumin low at 3.1 .  09/20/2013-INR 1.91.   09/19/2013-hemoglobin A1c 5.8.  Vitamin B12 level was 192.  TSH 1.29.  [name].  Assessment and plan.  #1-history of atrial fibrillation-this appears to be rate controlled currently on Cardizem he is also on Coumadin for anticoagulation-- INR is now therapeutic we'll give him another 3.75 mg tonight and then resume previous dosing and update this in 2 days.--  #2-history of volume overload question CHF-he is on fairly aggressive regimen of Lasix.-- this is currently being held along with his potassium-clinically he appears stable I did  discuss this with Dr. Leanord Hawkingobson via phone and we'll continue to hold the Lasix and potassium and update a metabolic panel on Sunday  Also monitor weights daily notify provider gain greater than 3 pounds    #3- cellulitis-this appears to be largely resolving of his lower extremities.  He is finishing a course of doxycycline.  #4-history COPD he is on nebulizers--had a history of significant smoking but apparently has not done this for some time has used smokeless tobacco products as well--his wife asked about possibly starting him on a nicotine patch however according to patient he's not really craving tobacco at this time will continue to monitor and family is in agreement with this   [id number]   .

## 2013-10-02 NOTE — Progress Notes (Addendum)
Patient ID: [name], male   DOB: 08/16/1931, 80 y.o.   MRN: [id number]                  HISTORY & PHYSICAL  DATE:  09/27/2013     FACILITY: [facility]    LEVEL OF CARE:   SNF   CHIEF COMPLAINT:  Admission to SNF, post stay at [facility], 09/18/2013 through 09/25/2013.    HISTORY OF PRESENT ILLNESS:  This is an 80 year-old man who lives in [place] with his wife.  He has multiple medical issues.    He apparently had an initial DVT in December 2014 and was on Coumadin for three months.  He then developed progressive pain and swelling in the left leg and had a repeat doppler in April 2015 that showed old clot burden in left venous system.  He was started on Coumadin.  Since then, he has had progressive lower extremity weakness with recurrent falls.    He fell at home on the day of admission and was unable to bear weight.  He was admitted to hospital.    In the hospital, he developed new onset atrial fibrillation.  He was felt to have aspiration-induced  respiratory failure versus COPD exacerbation versus volume overload.  Chest x-ray showed cardiomegaly with increased interstitial edema, bibasilar air space disease.  He was put on bronchodilators, Xopenex, Atrovent, Solu-Medrol, and he was given IV Lasix.  He was given IV vancomycin for the possibility of lower extremity cellulitis.  Zosyn was added.  He was transitioned to doxycycline.    Atrial fibrillation  apparently was new.  He had no prior history of this.  He was transitioned from a Cardizem drip and Coumadin was restarted.  A 2D-echo showed an EF of 60-65%, mild to moderate aortic regurgitation.    With regards to his weakness, PT and OT suggested skilled nursing facility.  MRI was negative for an acute stroke.  He had enlarged ventricles, felt to be secondary to parenchymal loss, although there was also a comment about normal pressure hydrocephalus.  He was seen by Neurology, who recommended an outpatient work-up  for neuropathy and dementia.    He was also noted to be B12 deficient and was started on B12 replacement.    He continued to complain of right leg pain.  There was some concern about cellulitis.  He had x-rays of the hip/femur which were negative for fracture.  CT scan of the right hip was negative.  He has multilevel spinal stenosis.  He had a right knee arthrocentesis done which  apparently showed blood, although I cannot actually find those results [facility] at the moment.    He was noted to have hypernatremia.     PAST MEDICAL HISTORY/PROBLEM LIST:  Actually quite extensive, including:    Acute hypoxic  respiratory failure.    Atrial fibrillation with rapid ventricular response, which is new.     DVT of the left leg, now back on Coumadin.    COPD.    B12 deficiency.    Right knee hemarthrosis apparently, although I could not easily find this in the system.     Cognitive impairment, labeled as dementia.    Obesity.    Possible pneumonia.    BPH.  On Proscar.    Hypernatremia.  On his arrival here yesterday, his sodium was 151.  His Lasix has been put on hold.  It is 149 this morning.    Generalized weakness.  CURRENT MEDICATIONS:  Discharge medications include:      Ventolin 2 puffs q.6 p.r.n.    Tussionex 5 mL q.12 p.r.n.    Vitamin B12 1000 U by mouth daily.    Cardizem CD 120 daily.    Cardura 4 mg daily.    Doxycycline 100 mg b.i.d. x3 days.    Finasteride 5 mg daily.    Lasix 40 b.i.d.    DuoNebs nebulizers two times daily.    Xalatan 0.005%, 1 drop into both eyes at bedtime.    Potassium 20 mEq, 2 tablets/40 mEq daily.    Coumadin 7.5 mg daily except 3.75 tablet on Monday, Wednesday, and Friday, 7.5 on Sunday, Tuesday, Thursday, and Saturday.    SOCIAL HISTORY:   HOUSING:  The patient tells me he lives in [place] with his wife.  He does not have any stairs in the house to manage.   FUNCTIONAL STATUS:  He uses a walker.  He was not  aware of recurrent falls, although he does admit to having a fall before entrance to the hospital.  His exact functional status is unclear.    REVIEW OF SYSTEMS:   CHEST/RESPIRATORY:  He is not complaining of shortness of breath.    CARDIAC:   No complaints of chest pain.   GI:  No nausea,  vomiting or abdominal pain.   GU:  Stated by the patient that he has extreme urinary frequency.  This also seems verified by the nurses.  Without clear dysuria or hematuria.  In fact, the nurse said he goes "every five minutes".    PHYSICAL EXAMINATION:   VITAL SIGNS:   O2 SATURATIONS:  95% on room air.   PULSE:  64 and feels fairly regular.   RESPIRATIONS:  18 and unlabored.   GENERAL APPEARANCE:  Pleasant man in no distress.   CHEST/RESPIRATORY:  Shallow air entry bilaterally.  No crackles or wheezes.   CARDIOVASCULAR:  CARDIAC:  Heart sounds are fairly regular.  There is a 2/6 pansystolic murmur over the mitral area, and a midsystolic 2/6 murmur at the aortic area.  Neither radiates.  There is no S3.  He appears to be somewhat volume-contracted.   GASTROINTESTINAL:  LIVER/SPLEEN/KIDNEYS:  No liver, no spleen.  No tenderness.   ABDOMEN:  No masses.   GENITOURINARY:  BLADDER:   Not distended.  There is no suprapubic or costovertebral angle tenderness.   CIRCULATION:   VITAL SIGNS:  Extremities:  Significant venous stasis with chronic stasis dermatitis.  Minimal to no edema.  There is no evidence of cellulitis here.   MUSCULOSKELETAL:   EXTREMITIES:   BILATERAL LOWER EXTREMITIES:  He has significant osteoarthritis, probably in both knees.  The right knee is warm and tender.  There is an effusion here.  I will need to see if I can find the results of the right knee arthrocentesis, although what I am being told is that this was a hemarthrosis.  He has difficulty moving the right leg at all due to pain.   NEUROLOGICAL:    DEEP TENDON REFLEXES:  He is diffusely hyporeflexic.  Toes are downgoing  bilaterally.   SENSATION/STRENGTH:  He has significant proximal lower extremity weakness.  Again, the right side seems worse although this may be related to knee pain.     ASSESSMENT/PLAN:  Respiratory failure on presentation to hospital.  He was diagnosed with pneumonia, ?CHF.  I do not see that he had a CT scan of the chest.  If anything currently,  he seems volume-contracted with a sodium of 151.  His Lasix is on hold.  I think he will probably need some reintroduction of some Lasix, although not at 40 b.i.d.  I will repeat a basic metabolic panel in two days.  At that point, he will need some Lasix reintroduced, probably 20 mg a day to start.    New onset atrial fibrillation with rapid ventricular response.  His rate is controlled.  He is back on Coumadin now for this reason and also because of DVT in the left leg.    COPD.  This does not seem unstable at the moment.    Knee effusion on the right.  This was  apparently tapped.  I will need to recheck the result of this, although what I am hearing is that this was blood.  The knee is still very tender and he probably has significant underlying osteoarthritis.  He is going to need encouragement to get up as I had a lot of trouble getting him to move the right leg at all.    Significant chronic venous insufficiency.  There is no evidence of cellulitis here currently.    Urinary frequency.  I did a rectal exam on him that showed a moderately enlarged prostate without nodules.  We will do a bladder ultrasound to get an idea of his postvoid residual.    Pressure area on the buttocks, stage I.  Careful positioning.    Cognitive impairment.  I would like to check a Folstein on him.  I did not specifically test him at the bedside, although he seemed to be able to deal with all my questions.  I note the MRI of the head.    His Lasix has been put on hold.  Bladder ultrasound ordered.  Follow-up lab work ordered.  Follow-up PT/INR.

## 2013-10-03 ENCOUNTER — Encounter: Payer: Self-pay | Admitting: Internal Medicine

## 2013-10-03 ENCOUNTER — Non-Acute Institutional Stay (SKILLED_NURSING_FACILITY): Payer: Medicare Other | Admitting: Internal Medicine

## 2013-10-03 DIAGNOSIS — M25461 Effusion, right knee: Secondary | ICD-10-CM

## 2013-10-03 DIAGNOSIS — M25469 Effusion, unspecified knee: Secondary | ICD-10-CM

## 2013-10-03 DIAGNOSIS — E870 Hyperosmolality and hypernatremia: Secondary | ICD-10-CM

## 2013-10-03 DIAGNOSIS — I509 Heart failure, unspecified: Secondary | ICD-10-CM

## 2013-10-03 DIAGNOSIS — I4891 Unspecified atrial fibrillation: Secondary | ICD-10-CM

## 2013-10-03 NOTE — Progress Notes (Signed)
Patient ID: Waymond CeraBryce A Langhans, male   DOB: Jul 16, 1931, 80 y.o.   MRN: [id number]   This is an acute visit.  Level of care skilled.  [facility]  Chief complaint-acute visit secondary to right knee issues-followup hypernatremia.  History of present illness.  Patient is a very pleasant 80 year old male who has had a complicated course the past several months.  Initially had a DVT in his left leg in December 2014.  He then developed progressive pain and swelling in the left leg that showed old clot burden in the left venous system he had been off Coumadin he was restarted.  Apparently he had continued progressive low extremity weakness and falls-.  Then she was admitted to the hospital where he developed new-onset A. fib and aspiration induced respiratory failure versus COPD versus volume overload.  He was treated with bronchodilators Xopenex Atrovent steroids and IV Lasix.  He was also given IV vancomycin for possibility of lower extremity cellulitis along with Zosyn-he was transitioned to doxycycline in this appears to have essentially resolved.  He continued complaining of right leg pain x-rays were negative as was a CT scan he does have a history of multilevel spinal stenosis.  He did have a right knee arthrocentesis done which apparently showed blood  and apparently this is followed by Dr.Aluisio--as noted in a progress note in mid April-.  His daughter is in the room and would like an expedient orthopedic consult secondary to what appears to be some effusion on the knee again-she feels this is hindering his therapy.  She did note possibility of a cortisone injection and I see that was noted as well in the orthopedic note  Currently patient is complaining of some knee pain especially with movement and with therapy.  We also have been following his sodium which was 151 shortly after admission his Lasix was held he had been on 40 mg twice a day-his sodium has trended down and is now  142.  It looks like his weights have been stable although last weight I see is 11317 oh May 1 on April 28 it was 319 and this was when the Lasix was held-  He does not complaining of any increased shortness of breath edema appears to be relatively at baseline O2 saturations have been in the 90s on room air.  Family medical social history has been reviewed per admission note on 09/27/2013.  Medications have been reviewed for MAR.  Review of systems.  In general does not complaining of any fever or chills.  Respiratory no complaints of shortness of breath or cough.  Cardiac no chest pain has chronic venous stasis changes and edema lower extremity.  GI does not complaining of any abdominal discomfort nausea or vomiting.  Musculoskeletal main complaint is of right knee pain with significant movement.  Neurologic does not complaining of headache dizziness or numbness.  Physical exam.  Temperature is 98.5 pulse 78 respirations 20 blood pressure 123/67 last noted to weight 317 pounds.  In general this is a pleasant elderly male in no distress lying comfortably in bed.  The skin is warm and dry.  Chest is clear to auscultation with shallow air entry there is no labored breathing.  Heart is regular rate and rhythm with a 2/6 systolic murmur-he has chronic it appears lower extremity edema venous stasis changes this does not appear grossly increased from previous exam.  Abdomen is somewhat obese soft nontender with positive bowel sounds  Muscle-skeletal-- moves all extremities x4 but quite  limited range of motion of his lower extremities there is some discomfort with attempt to flex and extend his right knee-- he does appear to have some edema compared to the left knee and some tenderness to palpation especially under the knee joint-it is not erythematous but is somewhat warm this appears to be similar to what Dr. Leanord Hawkingobson saw on his exam   .  Neurologic appears to be grossly intact no  lateralizing findings speech is clear.  Labs.  10/01/2013.  Sodium 142 potassium 4.1 BUN 18 creatinine 1.02.  10/02/2013.  INR-2.32.  09/29/2013.  Sodium 146 potassium 4.1 BUN 19 creatinine 1.08.  09/28/2013.  INR 3.16.  09/26/2013.  W BC 7.7 hemoglobin 11.7 platelets 269.  Sodium 151 potassium 3.8 BUN 19 creatinine 0.98.  Assessment plan.  #1- hypernatremia-this appears resolved I did discuss this with Dr. Leanord Hawkingobson via phone and we will restart Lasix at a lower dose 20 mg a day also will restart potassium at 10 mEq daily-this will need to be rechecked later this week to ensure stability  We'll check this on Friday, March 8.  Also will order weights tomorrow and 2 times a week to keep  an eye on his weights and possible fluid status  #2-right knee effusion-this appears to be persisting I did have a discussion with his daughter at bedside and will write an order to see if we can expedient orthopedic followup-apparently there is an orthopedic appointment set for late next week but family expressed concern if this could be moved up and certainly I will write an order to that effect--also will update CBC to make sure hemoglobin is stable  #3history of atrial fibrillation he continues on Cardizem for rate control this appears to be stable-he is on Coumadin for anticoagulation INR was slightly high several days ago and he received a reduced Coumadin dose for one day and it normalized-we'll update a PT/INR on Friday to ensure stability  [id number]-of note greater than 25 minutes spent assessing patient discussing family concerns at bedside and coordinating plan of care-of note greater than 50% of time spent coordinating plan of care   --

## 2013-10-04 ENCOUNTER — Non-Acute Institutional Stay (SKILLED_NURSING_FACILITY): Payer: Medicare Other | Admitting: Internal Medicine

## 2013-10-04 DIAGNOSIS — M25461 Effusion, right knee: Secondary | ICD-10-CM

## 2013-10-04 DIAGNOSIS — M25469 Effusion, unspecified knee: Secondary | ICD-10-CM

## 2013-10-04 DIAGNOSIS — E870 Hyperosmolality and hypernatremia: Secondary | ICD-10-CM

## 2013-10-04 DIAGNOSIS — I4891 Unspecified atrial fibrillation: Secondary | ICD-10-CM

## 2013-10-09 ENCOUNTER — Telehealth: Payer: Self-pay

## 2013-10-09 NOTE — Progress Notes (Addendum)
Patient ID: [name], male   DOB: 1932/03/25, 80 y.o.   MRN: [id number]                  PROGRESS NOTE  DATE:  10/04/2013    FACILITY: [facility]   LEVEL OF CARE:   SNF   Acute Visit   HISTORY OF PRESENT ILLNESS:  This is a patient who came to us after being hospitalized for hemarthrosis of his right knee (aspirated by Orthopedics), respiratory failure secondary to COPD and CHF, new onset atrial fibrillation.    He was admitted here with significant pain in the right knee and lower extremity weakness.    He has also had problems with hypernatremia and we have had to reduce his Lasix.  However, I do believe we have corrected this problem.    REVIEW OF SYSTEMS:   CHEST/RESPIRATORY:  The patient is not complaining of shortness of breath.   Tells me he quit smoking 20 years ago.   CARDIAC:  He is not complaining of chest pain.  His heart rate is controlled.   GI:  States he is eating and drinking well.   MUSCULOSKELETAL:   Still having some pain in the knees.  I also  note my initial H&P documenting significant proximal lower extremity weakness which I thought was probably disuse.    PHYSICAL EXAMINATION:   VITAL SIGNS:   O2 SATURATIONS:  94%.   PULSE:  74.   CHEST/RESPIRATORY:  Very poor air entry bilaterally.   CARDIOVASCULAR:   CARDIAC:  Heart sounds are regular.  No murmurs.  No signs of congestive heart failure.  He does have carotid pulsations in his neck.   EDEMA/VARICOSITIES:  He has severe bilateral venous stasis.   MUSCULOSKELETAL:   EXTREMITIES:   BILATERAL LOWER EXTREMITIES:  He has probably severe bilateral osteoarthritis.  There is still some inflammation and a mild effusion in the right knee.  All of this looks a lot better than when I saw this a week ago, however.    ASSESSMENT/PLAN:  Hemarthrosis of the right knee.  Secondary to trauma and anticoagulation.  I think this is better.  I did review the Orthopedics note.  Although they do not  specifically say the word hemarthrosis, they did do an aspirate in the hospital.  I do not see the fluid analysis, however.    Atrial fibrillation.  Rate is controlled.  Coumadin is therapeutic.    Dehydration due to excess amounts of Lasix.  We have reintroduced a small amount of Lasix and will follow his sodium.    Bilateral lower extremity weakness, which I think is a combination of osteoarthritis, recent hospitalizations, etc.  He needs hard work in physical therapy.  As mentioned, the right knee is better.    Probable severe COPD which, of course, complicates the intense rehab effort that is necessary here.    I have not added anything major to this gentleman.    His lab work from today shows a white count of 6.4, a hemoglobin of 11.6.  Differential count is normal.    We will check a basic metabolic panel on him next Monday.

## 2013-10-09 NOTE — Telephone Encounter (Signed)
Alona BeneJoyce (Mr. Donnetta Haileal's daughter) calling to explain that appointment for tomorrow 10-10-2013 had to be cancelled as Mr. Jennette Kettleeal has recently been transferred to [facility] and has mobility issues and is unable to travel in a vehicle.  Daughter has rescheduled the appointment to see Dr. Arbie CookeyEarly for 11-07-2013 in order to work out transportation issues with [facility] and to see an orthopedic physician first before seeing Dr. Arbie CookeyEarly.  Alona BeneJoyce will call [facility] for further questions or concerns if they arise prior to 11-07-2013 appointment with Dr. Arbie CookeyEarly.

## 2013-10-10 ENCOUNTER — Encounter: Payer: Medicare Other | Admitting: Vascular Surgery

## 2013-10-12 ENCOUNTER — Encounter: Payer: Medicare Other | Admitting: Cardiology

## 2013-10-19 ENCOUNTER — Encounter: Payer: Self-pay | Admitting: Internal Medicine

## 2013-10-19 ENCOUNTER — Non-Acute Institutional Stay (SKILLED_NURSING_FACILITY): Payer: Medicare Other | Admitting: Internal Medicine

## 2013-10-19 DIAGNOSIS — E870 Hyperosmolality and hypernatremia: Secondary | ICD-10-CM

## 2013-10-19 DIAGNOSIS — I509 Heart failure, unspecified: Secondary | ICD-10-CM

## 2013-10-19 DIAGNOSIS — M25469 Effusion, unspecified knee: Secondary | ICD-10-CM

## 2013-10-19 DIAGNOSIS — F29 Unspecified psychosis not due to a substance or known physiological condition: Secondary | ICD-10-CM

## 2013-10-19 DIAGNOSIS — R609 Edema, unspecified: Secondary | ICD-10-CM

## 2013-10-19 DIAGNOSIS — Z5181 Encounter for therapeutic drug level monitoring: Secondary | ICD-10-CM

## 2013-10-19 DIAGNOSIS — I4891 Unspecified atrial fibrillation: Secondary | ICD-10-CM

## 2013-10-19 DIAGNOSIS — Z7901 Long term (current) use of anticoagulants: Secondary | ICD-10-CM

## 2013-10-19 DIAGNOSIS — M25461 Effusion, right knee: Secondary | ICD-10-CM

## 2013-10-19 DIAGNOSIS — R410 Disorientation, unspecified: Secondary | ICD-10-CM

## 2013-10-19 NOTE — Progress Notes (Signed)
Patient ID: [name], male   DOB: [birth year], 80 y.o.   MRN: [id number]   This is an acute visit.  Level of care skilled.  [facility]  CH complaint-acute visit followup hypernatremia-CHF-right knee effusion-Atrial fibrillation anticoagulation management  History of present illness.  Patient is a pleasant 80 year old male who was hospitalized for a hema arthrosis of his right knee was aspirated by orthopedics he did see orthopedics about a week ago and had some more aspiration apparently this was a much smaller amount and received a steroid injection and he says his knee pain is much better.  Apparently he is doing fairly well with physical therapy.  Also has a history of COPD as well as CHF with new onset atrial fibrillation-he currently is on Coumadin INR today is 1.96 -he is on alternating doses of 7.5 and 3.75-previous INR was 2.45 on May 18 he is slated to receive the larger dose tonight.  This has been stable and he continues on Cardizem  In regards to CHF this was complicated initially with some hypernatremia on admission here-his Lasix was held and then restarted in early May-metabolic panel on May 8 showed that his sodium was stable at 143 we will update this tomorrow clinically he appears stable.  It appears he's lost about 6 pounds since admission to this facility late last month-anemia appears to be stable to somewhat improved.  He is not complaining of any increased shortness of breath or chest pain.  Apparently nursing staff has noted some increased confusion more so at night although when I saw him today he appeared to be at his baseline mentally.  Family medical social history has been reviewed per admission note on 09/27/2013.  Medications have been reviewed per MAR.  Review of systems.  In general he is not complaining of any fever or chills.  Respiratory no complaints of shortness of breath apparently even with therapy.  Cardiac no chest pain.  GI-no  complaints of abdominal pain nausea or vomiting apparently eats quite well.  GU does not complain of dysuria.  Muscle skeletal some lower extremity weakness which is baseline but apparently this is improving he is working with therapy does not complaining of knee pain this evening he is status post aspiration and steroid injection.  Neurologic no complaints of syncopal feelings headache or dizziness.  Psych-does have some listed history of dementia.  Physical exam.  Temperature is 98.7 pulse 81 respirations 20 blood pressure 120/72 O2 saturation 95% on room air.  In general this is a pleasant man in no distress sitting comfortably in his wheelchair.  His skin is warm and dry.  Chest is clear to auscultation with shallow air entry no labored breathing.  Heart regular rate and rhythm with a 2/6 systolic murmur.  He continues with significant venous stasis changes edema lower extremities although this appears if anything improved from previous exam.  Abdomen is obese soft nontender with positive bowel sounds  GU-could not appreciate any suprapubic tenderness or  CV tenderness.  Extremities continued with osteoarthritic changes lower extremities bilaterally with weakness but is able to move his lower legs bilaterally right knee has some slight tenderness to palpation in edema but this again appears to be improved with what appears to be fairly insignificant erythema.  Neurologic is grossly intact cranial nerves intact speech is clear.--No lateralizing findings grip strength strong bilaterally  Psych-. Is oriented to self and day of week however had difficulty naming the month and year --kind of  hesitated and stated "I'm confused"  Labs.  10/19/2013.  INR 1.96.  10/16/2013.  INR 2.45.  10/06/2013.  Sodium 143 potassium 4.6 BUN 19 creatinine 1.06.  10/04/2013.  WBC 6.4 hemoglobin 11.6 platelets 305.  Assessment and plan.  #1-history CHF with complications with  hypernatremia-we'll update BMP tomorrow this appears to be stable he has lost some weight I suspect this is fluid related with somewhat improved edema-clinically appears stable in this regards apparently there was some history of volume overload in the hospital. He is on low-dose Lasix with a small amount of potassium.  #2-history of right knee effusion this appears to be improved as well per orthopedic visit continue to monitor.--We'll update CBC as well  #3 history of atrial fibrillation-INR is borderline therapeutic he is going to get a higher dose of Coumadin tonight we'll update an INR tomorrow to see where it stands.  #4-history some increased confusion especially at night--patient does have some confusion although I suspect some of this is baseline-we'll update a urine to rule out infectious etiology  [id number]

## 2013-10-20 ENCOUNTER — Telehealth: Payer: Self-pay | Admitting: Family Medicine

## 2013-10-24 NOTE — Telephone Encounter (Signed)
She will discuss issues with the provider at the [facility] center.

## 2013-10-25 ENCOUNTER — Ambulatory Visit (HOSPITAL_COMMUNITY): Payer: Medicare Other

## 2013-10-26 ENCOUNTER — Encounter: Payer: Self-pay | Admitting: Internal Medicine

## 2013-10-26 ENCOUNTER — Non-Acute Institutional Stay (SKILLED_NURSING_FACILITY): Payer: Medicare Other | Admitting: Internal Medicine

## 2013-10-26 DIAGNOSIS — L03119 Cellulitis of unspecified part of limb: Principal | ICD-10-CM

## 2013-10-26 DIAGNOSIS — L02419 Cutaneous abscess of limb, unspecified: Secondary | ICD-10-CM

## 2013-10-26 DIAGNOSIS — R635 Abnormal weight gain: Secondary | ICD-10-CM

## 2013-10-26 NOTE — Progress Notes (Signed)
This encounter was created in error - please disregard.

## 2013-10-30 ENCOUNTER — Non-Acute Institutional Stay (SKILLED_NURSING_FACILITY): Payer: Medicare Other | Admitting: Internal Medicine

## 2013-10-30 ENCOUNTER — Encounter: Payer: Medicare Other | Admitting: Cardiology

## 2013-10-30 DIAGNOSIS — L02419 Cutaneous abscess of limb, unspecified: Secondary | ICD-10-CM

## 2013-10-30 DIAGNOSIS — R32 Unspecified urinary incontinence: Secondary | ICD-10-CM

## 2013-10-30 DIAGNOSIS — L03119 Cellulitis of unspecified part of limb: Principal | ICD-10-CM

## 2013-10-30 NOTE — Progress Notes (Addendum)
Patient ID: [name], male   DOB: February 28, 1932, 80 y.o.   MRN: [id number]                  PROGRESS NOTE  DATE:  10/25/2013    FACILITY: [facility]    LEVEL OF CARE:   SNF   Acute Visit   CHIEF COMPLAINT:  Trauma to the left lateral ankle.    HISTORY OF PRESENT ILLNESS:  Mr. Hatheway is a gentleman who came to [place] after a stay at [facility] with trauma to his right knee secondary to trauma and anticoagulation.  He had a hemarthrosis.  He also has atrial fibrillation, on chronic Coumadin, severe bilateral venous stasis, and probably significant COPD.    He really did not ambulate very well when he came here.  I am not exactly sure how he has been doing since.    Nursing reports that he traumatized his left leg on the wheelchair.  He has an open wound and erythema and pain around this.  I was asked to look at this today.  A venous doppler has been ordered.  REVIEW OF SYSTEMS:   CHEST/RESPIRATORY:  Significant shortness of breath on exertion.  I do not believe this has changed.   CARDIAC:   He has  apparently gained weight of 14 pounds since Monday, although I really do not see a lot of place that this could have occurred.  He went from 297 on Monday to 311 today.   MUSCULOSKELETAL:  Extremities:  Increasing pain in the left leg.    PHYSICAL EXAMINATION:   GENERAL APPEARANCE:  The patient is not in any distress.   Reading his newspaper in a wheelchair.   CHEST/RESPIRATORY:  Very poor air entry bilaterally.  No wheezing.  Work of breathing is normal.   CARDIOVASCULAR:  CARDIAC:  Heart sounds are distant.  There are no signs of CHF.   CIRCULATION:   EDEMA/VARICOSITIES:  Extremities:  Severe bilateral venous stasis, but no evidence of a DVT.   SKIN:  INSPECTION:  In the left leg, there is an open area near the lateral malleolus.  This is somewhat necrotic-looking.  More worrisome than this is spreading cellulitis in the lateral aspect of his foot.  This is intensely warm and  painful.  I have marked this area.    ASSESSMENT/PLAN:  Cellulitis of the left foot and lateral ankle.   I have started him on both doxycycline and Rocephin.  We will need to monitor his PT and INR carefully.  I see no evidence of a DVT here, although he does have severe bilateral stasis dermatitis which is chronic.    Weight gain.  I really see no evidence of this.  There is certainly no evidence of enough fluid to cause this degree of weight increase.  I suspect this may have been a weighing variation.

## 2013-11-01 NOTE — Progress Notes (Addendum)
Patient ID: [name], male   DOB: 1931/12/02, 80 y.o.   MRN: [id number]                  PROGRESS NOTE  DATE:  10/30/2013      FACILITY: [facility]    LEVEL OF CARE:   SNF   Acute Visit   CHIEF COMPLAINT:  Review of left foot cellulitis.    HISTORY OF PRESENT ILLNESS:  [name] is a gentleman who came to [place] after being diagnosed with CHF.    He had had atrial fibrillation with rapid ventricular response.  He is on Coumadin for this reason.    He also has significant venous stasis.  Last week, he was seen to have erythema on the lateral aspect of his left foot and ankle, together with a new ulceration.  This was quite compatible with cellulitis.  We empirically placed him on doxycycline and Rocephin.  There was nothing that could be easily cultured.  I am seeing him today in follow-up.     REVIEW OF SYSTEMS:   GENERAL:  Once again, the patient complains of very little.    GU:  He has incontinence.  I am not exactly sure why.  I would like to see if he could actually use a urinal.   EDEMA/VARICOSITIES:  Extremities:  He has severe venous stasis with secondary edema.  This has not really changed that much.    PHYSICAL EXAMINATION:   SKIN:  INSPECTION:  Left leg:  There is considerable improvement in the overall erythema on the lateral aspect of the left foot and ankle.  The wound itself is covered with an eschar.  I think we can move to a Santyl-based dressing here.  The Kerlix/Coban wrap will need to be better applied from foot to below his knee to control swelling around the wound bed.   CIRCULATION:   ARTERIAL:  I do not believe he has any significant PAD.  His peripheral pulses are palpable.    ASSESSMENT/PLAN:  Cellulitis, left foot, with significant resultant ulceration just near the left lateral malleolus in the setting of severe chronic venous insufficiency.  The cellulitis is improved.  I will extend the antibiotics for a full two weeks.     ?Urinary  incontinence.   I would like to properly review this.    Gait ataxia.   I am not exactly certain that I have ever understood this man's gait problems.  I will need to re-review this at some point, especially if he is approaching discharge.

## 2013-11-06 ENCOUNTER — Non-Acute Institutional Stay (SKILLED_NURSING_FACILITY): Payer: Medicare Other | Admitting: Internal Medicine

## 2013-11-06 ENCOUNTER — Encounter: Payer: Self-pay | Admitting: Vascular Surgery

## 2013-11-06 DIAGNOSIS — R32 Unspecified urinary incontinence: Secondary | ICD-10-CM

## 2013-11-06 DIAGNOSIS — L02419 Cutaneous abscess of limb, unspecified: Secondary | ICD-10-CM

## 2013-11-06 DIAGNOSIS — L03119 Cellulitis of unspecified part of limb: Secondary | ICD-10-CM

## 2013-11-06 DIAGNOSIS — J449 Chronic obstructive pulmonary disease, unspecified: Secondary | ICD-10-CM

## 2013-11-06 DIAGNOSIS — I509 Heart failure, unspecified: Secondary | ICD-10-CM

## 2013-11-07 ENCOUNTER — Other Ambulatory Visit: Payer: Self-pay | Admitting: *Deleted

## 2013-11-07 ENCOUNTER — Encounter: Payer: Self-pay | Admitting: Vascular Surgery

## 2013-11-07 ENCOUNTER — Ambulatory Visit (INDEPENDENT_AMBULATORY_CARE_PROVIDER_SITE_OTHER): Payer: Medicare Other | Admitting: Vascular Surgery

## 2013-11-07 VITALS — BP 123/70 | HR 82 | Resp 18 | Wt 308.0 lb

## 2013-11-07 DIAGNOSIS — I70209 Unspecified atherosclerosis of native arteries of extremities, unspecified extremity: Secondary | ICD-10-CM

## 2013-11-07 DIAGNOSIS — L98499 Non-pressure chronic ulcer of skin of other sites with unspecified severity: Secondary | ICD-10-CM

## 2013-11-07 DIAGNOSIS — J441 Chronic obstructive pulmonary disease with (acute) exacerbation: Secondary | ICD-10-CM

## 2013-11-07 DIAGNOSIS — I82409 Acute embolism and thrombosis of unspecified deep veins of unspecified lower extremity: Secondary | ICD-10-CM

## 2013-11-07 NOTE — Progress Notes (Signed)
Here today for evaluation of chronic venous hypertension and discussion of potential vena cava filter placement. He has a very complex past history. Most recently he suffered a fall and was hospitalized and eventually transferred to the nursing facility. He is hopefully seen to be discharged from a nursing facility. He does have severe chronic venous stasis disease and does have active ulcer over his left lateral malleolus. At home he had been treating this withwraps bilaterally from his foot up to his calf. Since being hospitalized in nursing facility there was attempts at placing a compression garment that this was complicated with skin irritation. I do have his duplex for evaluation during his hospitalization. From 09/08/2013 that there did appear to be chronic clot in his femoral vein and popliteal veins on the left. No evidence of DVT on the right. He is on chronic Coumadin therapy. The family felt this may be causing fatigue but he did develop atrial fibrillation while in the hospitalization and therefore is on Coumadin therapy for this reason as well.  Past Medical History  Diagnosis Date  . Venous insufficiency   . AAA (abdominal aortic aneurysm)   . Ulcer   . DVT (deep venous thrombosis) 05/04/2013    "LLE"  . Cellulitis   . Nicotine dependence   . Bronchospasm   . Elevated PSA   . BPH (benign prostatic hyperplasia)   . COPD (chronic obstructive pulmonary disease)   . Cognitive impairment   . Daily headache   . Arthritis     "probably on the right leg/hip" (09/20/2013)  . Dementia     "don't know exactly what kind" (09/20/2013)  . Kidney stones   . Glaucoma, both eyes     History  Substance Use Topics  . Smoking status: Former Smoker -- 2.00 packs/day for 54 years    Types: Cigarettes    Quit date: 08/24/2002  . Smokeless tobacco: Current User    Types: Snuff     Comment: 09/20/2013 "dips snuff"  . Alcohol Use: No    Family History  Problem Relation Age of Onset  . Aneurysm  Brother   . Heart attack Brother     10-29-12  . Cancer Father     colon    Allergies  Allergen Reactions  . Oxycodone Hcl     REACTION: makes pt "wild"  . Propoxyphene N-Acetaminophen     REACTION: Makes pt. "wild"    Current outpatient prescriptions:traMADol (ULTRAM) 50 MG tablet, Take 1 tablet (50 mg total) by mouth every 6 (six) hours as needed for severe pain., Disp: 120 tablet, Rfl: 5  BP 123/70  Pulse 82  Resp 18  Wt 308 lb (139.708 kg)  Body mass index is 33.84 kg/(m^2).       Physical exam obese white male sitting in a wheelchair and alert and answering questions Marked brawny edema both lower extremities with full thickness ulceration or rest left lateral malleolus with surrounding infection 2+ to 3+ dorsalis pedis pulses bilaterally  Impression and plan: Complex patient. I discussed this at length with the patient and his wife and his daughter present. I did explain the critical importance of elevation and compression for his venous hypertension. I do not see any evidence for vena cava filter placement since he is currently on Coumadin with no contraindication to this. Even if he did have a contraindication to systemic anticoagulation would be hesitant to place a vena cava filter since he does not have any absolute indication for a venous standpoint  for anticoagulation. Would recommend continued Coumadin therapy elevation and compression. The family is asking whether he would benefit from a hospital bed. I feel that he definitely would 2 increase his ability to be of acute his legs elevated which are the critical steps in controlling his fluid. We will see him in October is scheduled for followup of his infrarenal aneurysm with ultrasound

## 2013-11-08 ENCOUNTER — Non-Acute Institutional Stay (SKILLED_NURSING_FACILITY): Payer: Medicare Other | Admitting: Internal Medicine

## 2013-11-08 DIAGNOSIS — S91009A Unspecified open wound, unspecified ankle, initial encounter: Principal | ICD-10-CM

## 2013-11-08 DIAGNOSIS — S81009A Unspecified open wound, unspecified knee, initial encounter: Secondary | ICD-10-CM

## 2013-11-08 DIAGNOSIS — I4891 Unspecified atrial fibrillation: Secondary | ICD-10-CM

## 2013-11-08 DIAGNOSIS — E538 Deficiency of other specified B group vitamins: Secondary | ICD-10-CM

## 2013-11-08 DIAGNOSIS — S81809A Unspecified open wound, unspecified lower leg, initial encounter: Principal | ICD-10-CM

## 2013-11-08 DIAGNOSIS — R269 Unspecified abnormalities of gait and mobility: Secondary | ICD-10-CM

## 2013-11-08 NOTE — Progress Notes (Signed)
Patient ID: [name], male   DOB: 12/16/1931, 80 y.o.   MRN: [id number] Facility; [facility] Chief complaint; review of wound over his left lateral malleolus, gait ataxia History; this is an 80-year-old man who came to us in late April. At that point he had fallen at home and was was unable to weight bear. He was discovered to have new onset atrial fibrillation as well as a recent history of a DVT in December 2014 treated with Coumadin . He developed hemarthrosis in his knee. A 2-D echo showed an EF of 60-65% and mild to moderate aortic regurgitation his Coumadin was restarted. He had an MRI of the head that was negative for acute stroke however he had enlarged ventricles felt secondary to parenchymal loss although he was also seen by a neurologist with the comment about the low probability of normal pressure hydrocephalus. He was also noted by the neurologist and myself on admission to have diffuse hyporeflexia he is not a diabetic kit. He was found to be B12 deficient and started on oral B12. I do not actually see the B12 level  More recently he has been discovered to have a necrotic wound over the left lateral malleolus. This had extensive surrounding cellulitis which I have treated although this was left with nonviable eschar. Today the area was anesthetized with topical lidocaine. I did provided this with a #15 blade which the patient tolerated fairly well. Unfortunately this is a deep wound and really there is not a lot of viable tissue to be found yet. Is also in a very difficult area. Fortunately the cellulitis appears to have resolved.   Physical examination Neurologic he has adequate strength bilaterally. Once again he is diffusely hyporeflexic Gait; the patient is easily able to get out of bed and sit on the side of the bed. He is able to bring himself to a standing position with a walker. He walks with the left leg flexed and tends to drag it a bit behind him. Currently he fatigues in about 20  feet the form of his gait is narrow based, this is not reminiscent of either normal pressure hydrocephalus or a peripheral neuropathy type gait [both tend to be wide based]. Skin; deeply necrotic albeit reasonably small wound over the lateral malleolus on the left. This underwent a surgical debridement as noted above. He tolerated this well however even though the driver was fairly extensive I. still not did not reach a viable base  Impression/plan #1 severe gait ataxia. This is tremendously better than when he first came in the facility. He is able to walk for 20 feet with standby assistance using a walker. As mentioned the form of his gait is not reminiscent of normal pressure hydrocephalus. Although he certainly meets the triad of gait ataxia/urinary incontinence/dementia, I think there is enough here to make my suspicion is not very high. It might be nice for him to have a outpatient neurology review and will consider arranging this for him. His MRI will be listed below #2 vitamin B12 deficiency I can't find the exact quantity of his B12 level however I think this would be worth rechecking #3 dementia this is mild but certainly present. #4 significant urinary incontinence he was unable to manage this earlier in the admission. If he were to go home in, in addition to the arm etc. etc. might consider a condom catheter at night he is on Cardura at night although that may be for his hypertension #5 necrotic wound   over the left lateral malleolus there does not appear to be a arterial issue here although he is is certainly as significant venous insufficiency. Cellulitis surrounding this has resolved #6 social; apparently plans are being made to have this patient go home, this may be a bit premature I think he would benefit from another week to 2 predominantly for physical therapy. 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 Is going to remain on chronic Coumadin and I agree that a filter doesn't seem indicated at this point

## 2013-11-09 NOTE — Progress Notes (Addendum)
Patient ID: [name], male   DOB: 1932/04/04, 80 y.o.   MRN: [id number]                  PROGRESS NOTE  DATE:  11/06/2013    FACILITY: [facility]    LEVEL OF CARE:   SNF   Acute Visit   CHIEF COMPLAINT:  Edema.    HISTORY OF PRESENT ILLNESS:  Multiple phone calls about this gentleman's swelling, which is probably multifactorial.  He has very severe venous stasis in his lower extremities, also a history of CHF.  He also had new onset atrial fibrillation and is on Coumadin.    The major clinical issue I had with this man is extreme imbalance, urinary incontinence.  I noted that previously there had been some question about normal pressure hydrocephalus here and that the test of next choice would be a diagnostic spinal tap.    Finally, he had very significant cellulitis of his left foot and ankle last week.  He also has an unstageable wound over the left lateral malleolus.  We have been using Santyl for this.    REVIEW OF SYSTEMS:   HEENT:  He is not complaining of headache.   CHEST/RESPIRATORY:  No shortness of breath.  CARDIAC:   No clear chest pain.   His weight has been stable at 309 pounds on Lasix 20 mg.    Multiple concerns  apparently raised to the staff with regards to edema from predominantly his daughter GU: no dysuria or hematuria. He does not describe hesitancy. Simply describes inability to control his flow once he has the desire to void   PHYSICAL EXAMINATION:   VITAL SIGNS:   PULSE:  76 and regular.   RESPIRATIONS:  18.  )2 saat 95% on room air GENERAL APPEARANCE:  The patient looks in no distress.   CHEST/RESPIRATORY:  Shallow air entry, but clear.   CARDIOVASCULAR:  CARDIAC:   Heart sounds are normal.  JVP is borderline elevated at 45.   EDEMA/VARICOSITIES:  Extremities:  He has some edema, significant venous stasis.  None of this looks too different from what I am used to seeing.    ASSESSMENT/PLAN:  Diastolically-mediated congestive heart  failure.  It is possible he is slightly volume-overloaded.  I will increase his Lasix to 40 mg, although I think most of this is venous stasis.    Severe gait ataxia, urinary incontinence.  I will try to review his neuroimaging.  I do not think it is out of the question about considering a diagnostic spinal tap.  I will need to see who has seen him from a neurologic point of view.  I would also like to review his gait.   Probable COPD.    Unstageable pressure wound over the left lateral malleolus.  This is going to need debridement.  I will try to arrange on Wednesday.

## 2013-11-13 ENCOUNTER — Ambulatory Visit (INDEPENDENT_AMBULATORY_CARE_PROVIDER_SITE_OTHER): Payer: Medicare Other | Admitting: Pharmacist

## 2013-11-13 ENCOUNTER — Telehealth: Payer: Self-pay | Admitting: *Deleted

## 2013-11-13 DIAGNOSIS — Z86718 Personal history of other venous thrombosis and embolism: Secondary | ICD-10-CM

## 2013-11-13 LAB — POCT INR: INR: 3.2

## 2013-11-13 NOTE — Telephone Encounter (Signed)
Spoke with patient's wife and [facility] Elnita Maxwell- Cheryl.  Patient is to skip warfarin dose tonight.  Then starting tomorrow will decreased warfarin to 1/2 tablet mondays, wednesdays, fridays and saturdays.  1 tablet sundays, tuesdays and thursdays.  Recheck INR in 1 week .

## 2013-11-13 NOTE — Telephone Encounter (Signed)
[facility] 3.2 and PT 38.9. Pt on Coumadin 7.5mg  Sun, Tues, Thurs, Sat and 1/2 on Mon., Wed, and Friday.

## 2013-11-15 ENCOUNTER — Other Ambulatory Visit: Payer: Self-pay | Admitting: *Deleted

## 2013-11-15 MED ORDER — POTASSIUM CHLORIDE CRYS ER 10 MEQ PO TBCR: 10.0000 meq | EXTENDED_RELEASE_TABLET | Freq: Every day | ORAL | Status: DC

## 2013-11-21 ENCOUNTER — Telehealth: Payer: Self-pay | Admitting: *Deleted

## 2013-11-21 NOTE — Telephone Encounter (Signed)
PT 38.5 INR 3.3  Patient has 7.5mg  tablets and he takes 1/2 tab M W F Sat, [facility]

## 2013-11-21 NOTE — Telephone Encounter (Signed)
Virgina OrganInstructed Tina at advanced services to have patient decrease warfarin to 1/2 tab on M W F Sat and Sunday and 1 tablet on Tuesdays and Thursdays and to eat some greens today or tomorrow.  Will re-check in 1 week

## 2013-11-24 ENCOUNTER — Ambulatory Visit (INDEPENDENT_AMBULATORY_CARE_PROVIDER_SITE_OTHER): Payer: Medicare Other | Admitting: Family

## 2013-11-24 ENCOUNTER — Encounter: Payer: Self-pay | Admitting: Family

## 2013-11-24 VITALS — BP 98/58 | HR 74 | Temp 98.5°F | Ht >= 80 in

## 2013-11-24 DIAGNOSIS — Z09 Encounter for follow-up examination after completed treatment for conditions other than malignant neoplasm: Secondary | ICD-10-CM

## 2013-11-24 DIAGNOSIS — I83009 Varicose veins of unspecified lower extremity with ulcer of unspecified site: Secondary | ICD-10-CM

## 2013-11-24 DIAGNOSIS — L97909 Non-pressure chronic ulcer of unspecified part of unspecified lower leg with unspecified severity: Secondary | ICD-10-CM

## 2013-11-24 DIAGNOSIS — IMO0001 Reserved for inherently not codable concepts without codable children: Secondary | ICD-10-CM

## 2013-11-24 LAB — POCT URINALYSIS DIPSTICK
Bilirubin, UA: NEGATIVE
GLUCOSE UA: NEGATIVE
Ketones, UA: NEGATIVE
Nitrite, UA: NEGATIVE
Protein, UA: NEGATIVE
Spec Grav, UA: 1.025
UROBILINOGEN UA: NEGATIVE
pH, UA: 5

## 2013-11-24 LAB — POCT UA - MICROSCOPIC ONLY
Bacteria, U Microscopic: NEGATIVE
CRYSTALS, UR, HPF, POC: NEGATIVE
Casts, Ur, LPF, POC: NEGATIVE
Mucus, UA: NEGATIVE
Yeast, UA: NEGATIVE

## 2013-11-24 NOTE — Addendum Note (Signed)
Addended by: Tommas OlpHANDY, ASHLEY N on: 11/24/2013 12:10 PM   Modules accepted: Orders

## 2013-11-24 NOTE — Addendum Note (Signed)
Addended by: Prescott GumLAND, JESSICA M on: 11/24/2013 04:48 PM   Modules accepted: Orders

## 2013-11-24 NOTE — Patient Instructions (Signed)
Stasis Ulcer °Stasis ulcers occur in the legs when the circulation is damaged. An ulcer may look like a small hole in the skin.  °CAUSES °Stasis ulcers occur because your veins do not work properly. Veins have valves that help the blood return to the heart. If these valves do not work right, blood flows backwards and backs up into the veins near the skin. This condition causes the veins to become larger because of increased pressure and may lead to a stasis ulcer. °SYMPTOMS  °· Shallow (superficial) sore on the leg. °· Clear drainage or weeping from the sore. °· Leg pain or a feeling of heaviness. This may be worse at the end of the day. °· Leg swelling. °· Skin color changes. °DIAGNOSIS  °Your caregiver will make a diagnosis by examining your leg. Your caregiver may order tests such as an ultrasound or other studies to evaluate the blood flow of the leg. °HOME CARE INSTRUCTIONS  °· Do not stand or sit in one position for long periods of time. Do not sit with your legs crossed. Rest with your legs raised during the day. If possible, it is best if you can elevate your legs above your heart for 30 minutes, 3 to 4 times a day. °· Wear elastic stockings or support hose. Do not wear other tight encircling garments around legs, pelvis, or waist. This causes increased pressure in your veins. If your caregiver has applied compressive medicated wraps, use them as instructed. °· Walk as much as possible to increase blood flow. If you are taking long rides in a car or plane, take a break to walk around every 2 hours. If not already on aspirin, take a baby aspirin before long trips unless you have medical reasons that prohibit this. °· Raise the foot of your bed at night with 2-inch blocks if approved by your caregiver. This may not be desirable if you have heart failure or breathing problems. °· If you get a cut in the skin over the vein and the vein bleeds, lie down with your leg raised and gently clean the area with a clean  cloth. Apply pressure on the cut until the bleeding stops. Then place a dressing on the cut. See your caregiver if it continues to bleed or needs stitches. Also, see your caregiver if you develop an infection. Signs of an infection include a fever, redness, increased pain, and drainage of pus. °· If your caregiver has given you a follow-up appointment, it is very important to keep that appointment. Not keeping the appointment could result in a chronic or permanent injury, pain, and disability. If there is any problem keeping the appointment, call your caregiver for assistance. °SEEK IMMEDIATE MEDICAL CARE IF: °· The ulcer area starts to break down. °· You have pain, redness, tenderness, pus, or hard swelling in your leg over a vein or near the ulcer. °· Your leg pain is uncomfortable. °· You develop an unexplained fever. °· You develop chest pain or shortness of breath. °Document Released: 02/10/2001 Document Revised: 08/10/2011 Document Reviewed: 09/07/2010 °ExitCare® Patient Information ©2015 [employer]. This information is not intended to replace advice given to you by your health care Jeanae Whitmill. Make sure you discuss any questions you have with your health care Shy Guallpa. ° °Venous Stasis or Chronic Venous Insufficiency °Chronic venous insufficiency, also called venous stasis, is a condition that affects the veins in the legs. The condition prevents blood from being pumped through these veins effectively. Blood may no longer be pumped effectively   from the legs back to the heart. This condition can range from mild to severe. With proper treatment, you should be able to continue with an active life. °CAUSES  °Chronic venous insufficiency occurs when the vein walls become stretched, weakened, or damaged or when valves within the vein are damaged. Some common causes of this include: °· High blood pressure inside the veins (venous hypertension). °· Increased blood pressure in the leg veins from long periods of  sitting or standing. °· A blood clot that blocks blood flow in a vein (deep vein thrombosis). °· Inflammation of a superficial vein (phlebitis) that causes a blood clot to form. °RISK FACTORS °Various things can make you more likely to develop chronic venous insufficiency, including: °· Family history of this condition. °· Obesity. °· Pregnancy. °· Sedentary lifestyle. °· Smoking. °· Jobs requiring long periods of standing or sitting in one place. °· Being a certain age. Women in their 40s and 50s and men in their 70s are more likely to develop this condition. °SIGNS AND SYMPTOMS  °Symptoms may include:  °· Varicose veins. °· Skin breakdown or ulcers. °· Reddened or discolored skin on the leg. °· Brown, smooth, tight, and painful skin just above the ankle, usually on the inside surface (lipodermatosclerosis). °· Swelling. °DIAGNOSIS  °To diagnose this condition, your health care Johndaniel Catlin will take a medical history and do a physical exam. The following tests may be ordered to confirm the diagnosis: °· Duplex ultrasound--A procedure that produces a picture of a blood vessel and nearby organs and also provides information on blood flow through the blood vessel. °· Plethysmography--A procedure that tests blood flow. °· A venogram, or venography--A procedure used to look at the veins using X-ray and dye. °TREATMENT °The goals of treatment are to help you return to an active life and to minimize pain or disability. Treatment will depend on the severity of the condition. Medical procedures may be needed for severe cases. Treatment options may include:  °· Use of compression stockings. These can help with symptoms and lower the chances of the problem getting worse, but they do not cure the problem. °· Sclerotherapy--A procedure involving an injection of a material that "dissolves" the damaged veins. Other veins in the network of blood vessels take over the function of the damaged veins. °· Surgery to remove the vein or cut  off blood flow through the vein (vein stripping or laser ablation surgery). °· Surgery to repair a valve. °HOME CARE INSTRUCTIONS  °· Wear compression stockings as directed by your health care Antwione Picotte. °· Only take over-the-counter or prescription medicines for pain, discomfort, or fever as directed by your health care Careli Luzader. °· Follow up with your health care Lilyian Quayle as directed. °SEEK MEDICAL CARE IF:  °· You have redness, swelling, or increasing pain in the affected area. °· You see a red streak or line that extends up or down from the affected area. °· You have a breakdown or loss of skin in the affected area, even if the breakdown is small. °· You have an injury to the affected area. °SEEK IMMEDIATE MEDICAL CARE IF:  °· You have an injury and open wound in the affected area. °· Your pain is severe and does not improve with medicine. °· You have sudden numbness or weakness in the foot or ankle below the affected area, or you have trouble moving your foot or ankle. °· You have a fever or persistent symptoms for more than 2-3 days. °· You have a fever   and your symptoms suddenly get worse. °MAKE SURE YOU:  °· Understand these instructions. °· Will watch your condition. °· Will get help right away if you are not doing well or get worse. °Document Released: 09/21/2006 Document Revised: 03/08/2013 Document Reviewed: 01/23/2013 °Exit[employer]® Patient Information ©2015 [employer]. This information is not intended to replace advice given to you by your health care Turner Baillie. Make sure you discuss any questions you have with your health care Elissia Spiewak. ° °

## 2013-11-24 NOTE — Progress Notes (Signed)
   Subjective:    Patient ID: [name], male    DOB: [birth year], 80 y.o.   MRN: [id number]  HPI Pt presents to office for hospital follow-up. Pt fell at home and could not bear weight on his right knee. Pt was admitted to the hospital on April 20 for cellulitis r/t the fall and warfarin. PT was then discharged to a SNF and he was discharged from there to home on June 12. Pt has received a "steriod injections" into his right knee and it has helped greatly. Pt states he has swelling in bilateral extremities with venous stasis. Pt denies weakness or SOB.     Review of Systems  HENT: Negative.   Respiratory: Negative.   Cardiovascular: Negative.   Gastrointestinal: Negative.   Genitourinary: Negative.   Musculoskeletal: Negative.   Neurological: Negative.   Hematological: Negative.   All other systems reviewed and are negative.      Objective:   Physical Exam  Vitals reviewed. Constitutional: He is oriented to person, place, and time. He appears well-developed and well-nourished. No distress.  HENT:  Head: Normocephalic.  Right Ear: External ear normal.  Left Ear: External ear normal.  Mouth/Throat: Oropharynx is clear and moist.  Eyes: Pupils are equal, round, and reactive to light. Right eye exhibits no discharge. Left eye exhibits no discharge.  Neck: Normal range of motion. Neck supple. No thyromegaly present.  Cardiovascular: Normal rate, regular rhythm, normal heart sounds and intact distal pulses.   No murmur heard. Pulmonary/Chest: Effort normal and breath sounds normal. No respiratory distress. He has no wheezes.  Abdominal: Soft. Bowel sounds are normal. He exhibits no distension. There is no tenderness.  Musculoskeletal: Normal range of motion. He exhibits edema and tenderness.  Pt has venous ulcer on lateral left ankle- Pt currently having home health BID a week to wrap pt's feet.   Neurological: He is alert and oriented to person, place, and time. He has normal  reflexes. No cranial nerve deficit.  Skin: Skin is warm and dry. No rash noted. There is erythema.  Venous ulcer with sloughing approx 4X2 cm, bilateral legs are purple in color and strong pulses  Psychiatric: He has a normal mood and affect. His behavior is normal. Judgment and thought content normal.    BP 98/58  Pulse 74  Temp(Src) 98.5 F (36.9 C) (Oral)  Ht $R'6\' 8"'Uq$  (2.032 m)       Assessment & Plan:  1. Hospital discharge follow-up - Ambulatory referral to Wound Clinic - POCT CBC - POCT urinalysis dipstick - POCT UA - Microscopic Only - CMP14+EGFR  2. Venous stasis ulcer, left -Pt to see wound care and home health to be set up -Continue wrapping legs to decrease edema - Ambulatory referral to [place], FNP

## 2013-11-25 LAB — CMP14+EGFR
ALT: 12 IU/L (ref 0–44)
AST: 15 IU/L (ref 0–40)
Albumin/Globulin Ratio: 1.5 (ref 1.1–2.5)
Albumin: 3.9 g/dL (ref 3.5–4.7)
Alkaline Phosphatase: 102 IU/L (ref 39–117)
BUN/Creatinine Ratio: 15 (ref 10–22)
BUN: 17 mg/dL (ref 8–27)
CO2: 26 mmol/L (ref 18–29)
Calcium: 10 mg/dL (ref 8.6–10.2)
Chloride: 103 mmol/L (ref 97–108)
Creatinine, Ser: 1.1 mg/dL (ref 0.76–1.27)
GFR calc Af Amer: 73 mL/min/1.73
GFR calc non Af Amer: 63 mL/min/1.73
Globulin, Total: 2.6 g/dL (ref 1.5–4.5)
Glucose: 108 mg/dL — ABNORMAL HIGH (ref 65–99)
Potassium: 5.4 mmol/L — ABNORMAL HIGH (ref 3.5–5.2)
Sodium: 144 mmol/L (ref 134–144)
Total Bilirubin: 0.4 mg/dL (ref 0.0–1.2)
Total Protein: 6.5 g/dL (ref 6.0–8.5)

## 2013-11-25 LAB — CBC WITH DIFFERENTIAL
Basophils Absolute: 0.1 x10E3/uL (ref 0.0–0.2)
Basos: 1 %
Eos: 13 %
Eosinophils Absolute: 1.2 x10E3/uL — ABNORMAL HIGH (ref 0.0–0.4)
HCT: 42.2 % (ref 37.5–51.0)
Hemoglobin: 13.7 g/dL (ref 12.6–17.7)
Immature Grans (Abs): 0 x10E3/uL (ref 0.0–0.1)
Immature Granulocytes: 0 %
Lymphocytes Absolute: 2.6 x10E3/uL (ref 0.7–3.1)
Lymphs: 29 %
MCH: 28.5 pg (ref 26.6–33.0)
MCHC: 32.5 g/dL (ref 31.5–35.7)
MCV: 88 fL (ref 79–97)
Monocytes Absolute: 0.9 x10E3/uL (ref 0.1–0.9)
Monocytes: 11 %
Neutrophils Absolute: 4.2 x10E3/uL (ref 1.4–7.0)
Neutrophils Relative %: 46 %
Platelets: 308 x10E3/uL (ref 150–379)
RBC: 4.8 x10E6/uL (ref 4.14–5.80)
RDW: 14.9 % (ref 12.3–15.4)
WBC: 9 x10E3/uL (ref 3.4–10.8)

## 2013-11-27 ENCOUNTER — Other Ambulatory Visit: Payer: Self-pay | Admitting: Family

## 2013-11-27 ENCOUNTER — Telehealth: Payer: Self-pay | Admitting: Family

## 2013-11-27 LAB — URINE CULTURE

## 2013-11-27 MED ORDER — AMOXICILLIN-POT CLAVULANATE 875-125 MG PO TABS: 1.0000 | ORAL_TABLET | Freq: Two times a day (BID) | ORAL | Status: DC

## 2013-11-27 NOTE — Telephone Encounter (Signed)
Please review for patient  

## 2013-11-29 ENCOUNTER — Telehealth: Payer: Self-pay | Admitting: Family

## 2013-11-30 ENCOUNTER — Telehealth: Payer: Self-pay | Admitting: *Deleted

## 2013-11-30 ENCOUNTER — Ambulatory Visit (INDEPENDENT_AMBULATORY_CARE_PROVIDER_SITE_OTHER): Payer: Medicare Other | Admitting: Pharmacist

## 2013-11-30 DIAGNOSIS — Z86718 Personal history of other venous thrombosis and embolism: Secondary | ICD-10-CM

## 2013-11-30 LAB — POCT INR: INR: 2.4

## 2013-11-30 NOTE — Telephone Encounter (Signed)
PT 29.2, INR 2.4 = Takes 1/2 of 7.5mg  tabs everyday except tues. And thurs ,then takes a whole tab

## 2013-11-30 NOTE — Telephone Encounter (Signed)
Patient to continue current dose of 7.5 mg 1 tablet on tuesdays and thursdays and 1/2 all other days.  Recheck in 1 week.  [facility] notified and Patient's wife notified.

## 2013-12-05 ENCOUNTER — Other Ambulatory Visit: Payer: Self-pay | Admitting: *Deleted

## 2013-12-05 DIAGNOSIS — J441 Chronic obstructive pulmonary disease with (acute) exacerbation: Secondary | ICD-10-CM

## 2013-12-07 ENCOUNTER — Telehealth: Payer: Self-pay | Admitting: *Deleted

## 2013-12-07 ENCOUNTER — Ambulatory Visit (INDEPENDENT_AMBULATORY_CARE_PROVIDER_SITE_OTHER): Payer: Medicare Other | Admitting: Pharmacist

## 2013-12-07 DIAGNOSIS — Z86718 Personal history of other venous thrombosis and embolism: Secondary | ICD-10-CM

## 2013-12-07 LAB — POCT INR: INR: 2.2

## 2013-12-07 NOTE — Telephone Encounter (Signed)
INR 2.2  Protime 26.7

## 2013-12-07 NOTE — Telephone Encounter (Signed)
Continue warfarin 7.5mg  1 tablet on tuesdays and thursdays, 3.75mg  or 1/2 all other days. [facility] and [facility] notified.  Reordered INR for 1 week.

## 2013-12-09 ENCOUNTER — Other Ambulatory Visit: Payer: Self-pay | Admitting: Nurse Practitioner

## 2013-12-09 MED ORDER — FUROSEMIDE 40 MG PO TABS: 20.0000 mg | ORAL_TABLET | Freq: Every day | ORAL | Status: DC

## 2013-12-09 MED ORDER — CYANOCOBALAMIN 1000 MCG PO TABS: 1000.0000 ug | ORAL_TABLET | Freq: Every day | ORAL | Status: DC

## 2013-12-09 MED ORDER — DILTIAZEM HCL ER COATED BEADS 120 MG PO CP24: 120.0000 mg | ORAL_CAPSULE | Freq: Every day | ORAL | Status: DC

## 2013-12-13 ENCOUNTER — Ambulatory Visit (INDEPENDENT_AMBULATORY_CARE_PROVIDER_SITE_OTHER): Payer: Medicare Other | Admitting: Pharmacist

## 2013-12-13 ENCOUNTER — Telehealth: Payer: Self-pay | Admitting: *Deleted

## 2013-12-13 DIAGNOSIS — Z86718 Personal history of other venous thrombosis and embolism: Secondary | ICD-10-CM

## 2013-12-13 LAB — POCT INR: INR: 1.8

## 2013-12-13 NOTE — Telephone Encounter (Signed)
Increase warfarin to 7.5mg  1 tablet on tuesdays, thursdays and saturdays.  1/2  Tablet all other days.  Recheck in 1-2 weeks.  Patient's wife and home health nurse - Inetta Fermoina notified of dose change and when to recheck INR

## 2013-12-13 NOTE — Telephone Encounter (Signed)
Protime 21.2  INR 1.8 Takes 7.5 mg tab on Tues and Thurs and 1/2 tab all other days

## 2013-12-14 ENCOUNTER — Telehealth: Payer: Self-pay | Admitting: Family

## 2013-12-14 NOTE — Telephone Encounter (Signed)
He is not sleeping at night. He had a UTI he got better. He gets very angry. He is very depressed. He is very [place]. She needs to know what the next step. He does not want to go anywhere. She wants some direction on what to do? Cell number [phone]

## 2013-12-15 ENCOUNTER — Encounter (HOSPITAL_BASED_OUTPATIENT_CLINIC_OR_DEPARTMENT_OTHER): Payer: Medicare Other | Attending: General Surgery

## 2013-12-15 DIAGNOSIS — I872 Venous insufficiency (chronic) (peripheral): Secondary | ICD-10-CM | POA: Diagnosis not present

## 2013-12-15 DIAGNOSIS — I890 Lymphedema, not elsewhere classified: Secondary | ICD-10-CM | POA: Insufficient documentation

## 2013-12-15 DIAGNOSIS — Z7901 Long term (current) use of anticoagulants: Secondary | ICD-10-CM | POA: Insufficient documentation

## 2013-12-15 DIAGNOSIS — L97909 Non-pressure chronic ulcer of unspecified part of unspecified lower leg with unspecified severity: Secondary | ICD-10-CM | POA: Diagnosis not present

## 2013-12-15 DIAGNOSIS — I4891 Unspecified atrial fibrillation: Secondary | ICD-10-CM | POA: Insufficient documentation

## 2013-12-15 DIAGNOSIS — F0390 Unspecified dementia without behavioral disturbance: Secondary | ICD-10-CM | POA: Diagnosis not present

## 2013-12-15 DIAGNOSIS — I10 Essential (primary) hypertension: Secondary | ICD-10-CM | POA: Diagnosis not present

## 2013-12-15 DIAGNOSIS — I509 Heart failure, unspecified: Secondary | ICD-10-CM | POA: Diagnosis not present

## 2013-12-15 NOTE — Telephone Encounter (Signed)
Pt needs to be seen. Recheck urine for infection. Pt may need referral for dementia.

## 2013-12-16 NOTE — Progress Notes (Signed)
[facility]  NAME:  [name], [name]                  ACCOUNT NO.:  [id number]  MEDICAL RECORD NO.:  [id number]      DATE OF BIRTH:  January 16, 1932  PHYSICIAN:  Ardath SaxPeter Janei Scheff, M.D.           VISIT DATE:                                  OFFICE VISIT   He is an 80 year old man who is somewhat demented and he has fairly significant venous stasis and lymphedema of both of his legs.  When he came here today, he had a blood pressure of 143/66, pulse 70, temperature 98, he weighs 310 pounds.  He is on many medicines for hypertension and congestive heart failure.  His medicines include Lasix 80 mg a day, lisinopril, Proventil, Cardizem, potassium, Ultram and Coumadin 7.5 mg a day.  He has had several surgeries before including cholecystectomy and a pinning of fractured right hip and internal fixation of a right femur fracture.  On examination here, he seem reasonably alert.  He was here with his daughter, who answered most of the questions.  I found him to have bilateral venous ulcers with inflammation and lymphedema.  I decided to put him in bilateral Unna boots and treat the ulcers with Santyl.  We did get a culture and therefore, appropriately I will give him antibiotics.  So his diagnoses are hypertension, senile dementia, venous stasis, venous ulcers, history of AFib, for which, he is on Coumadin.     Ardath SaxPeter Amaliya Whitelaw, M.D.     PP/MEDQ  D:  12/15/2013  T:  12/16/2013  Job:  [id number]

## 2013-12-18 NOTE — Telephone Encounter (Signed)
Patients daughter aware will call back later for an appointment

## 2013-12-18 NOTE — Telephone Encounter (Signed)
Patient will call back

## 2013-12-18 NOTE — Telephone Encounter (Signed)
[name] said she would call back

## 2013-12-19 ENCOUNTER — Telehealth: Payer: Self-pay | Admitting: *Deleted

## 2013-12-19 ENCOUNTER — Telehealth: Payer: Self-pay | Admitting: Family Medicine

## 2013-12-19 NOTE — Telephone Encounter (Signed)
Protime 21.1, INR 1.8  Patient takes 7.5 mg 1 tab on Tues, thurs, and Saturday. Takes 1/2  Of 7.5 mg tab all other days. Please have nurse call Inetta Fermoina at [facility] with new orders and when to check again. [phone]

## 2013-12-19 NOTE — Telephone Encounter (Signed)
Appt scheduled for 3:30 on 12/20/13. Family aware.

## 2013-12-20 ENCOUNTER — Ambulatory Visit: Payer: Medicare Other | Admitting: Family Medicine

## 2013-12-20 ENCOUNTER — Other Ambulatory Visit: Payer: Self-pay | Admitting: Family

## 2013-12-20 DIAGNOSIS — Z86718 Personal history of other venous thrombosis and embolism: Secondary | ICD-10-CM

## 2013-12-20 LAB — POCT INR: INR: 1.8

## 2013-12-20 NOTE — Patient Instructions (Signed)
INR as of 12/20/2013 and Previous Dosing Information   INR Dt [facility]al [facility] Tot Sun Mon Tue Wed Thu Fri Sat   12/13/2013 1.8 2.0-3.0 37.5 mg 3.75 mg 3.75 mg 7.5 mg 3.75 mg 7.5 mg 3.75 mg 7.5 mg    Previous description       Continue current dose of 1/2 tablet daily except 1 tablet on Tuesdays Thursdays and saturdays.       Anticoagulation Dose Instructions as of 12/20/2013     Total Sun Mon Tue Wed Thu Fri Sat   New Dose 45 mg 7.5 mg 3.75 mg 7.5 mg 3.75 mg 7.5 mg 7.5 mg 7.5 mg     (7.5 mg x 1)  (7.5 mg x 0.5)  (7.5 mg x 1)  (7.5 mg x 0.5)  (7.5 mg x 1)  (7.5 mg x 1)  (7.5 mg x 1)                         Description       Take 1 tablet daily except on Monday and Wednesday take 1/2 tablet

## 2013-12-21 ENCOUNTER — Ambulatory Visit (INDEPENDENT_AMBULATORY_CARE_PROVIDER_SITE_OTHER): Payer: Medicare Other | Admitting: Pharmacist

## 2013-12-21 DIAGNOSIS — Z86718 Personal history of other venous thrombosis and embolism: Secondary | ICD-10-CM

## 2013-12-21 NOTE — Telephone Encounter (Signed)
Recommend increase to 1/2 tablet MWF and 1 tablet all other days. Recheck INR in 1 week. Patient's wife and Inetta Fermoina advised of dose change and order to recheck pt placed.

## 2013-12-22 DIAGNOSIS — I10 Essential (primary) hypertension: Secondary | ICD-10-CM | POA: Diagnosis not present

## 2013-12-22 DIAGNOSIS — I872 Venous insufficiency (chronic) (peripheral): Secondary | ICD-10-CM | POA: Diagnosis not present

## 2013-12-22 DIAGNOSIS — L97909 Non-pressure chronic ulcer of unspecified part of unspecified lower leg with unspecified severity: Secondary | ICD-10-CM | POA: Diagnosis not present

## 2013-12-22 DIAGNOSIS — I890 Lymphedema, not elsewhere classified: Secondary | ICD-10-CM | POA: Diagnosis not present

## 2013-12-27 ENCOUNTER — Other Ambulatory Visit: Payer: Self-pay | Admitting: Pharmacist

## 2013-12-27 ENCOUNTER — Telehealth: Payer: Self-pay | Admitting: *Deleted

## 2013-12-27 DIAGNOSIS — Z86718 Personal history of other venous thrombosis and embolism: Secondary | ICD-10-CM

## 2013-12-27 LAB — POCT INR: INR: 2.2

## 2013-12-27 NOTE — Telephone Encounter (Signed)
Patient to continue current warfarin dose of 3.75mg  MWF and 1 tablet of 7.5mg  all other days.  Started cipro for 10 days - recheck INR next week [facility] and [facility]'s wife notified.

## 2013-12-27 NOTE — Telephone Encounter (Signed)
Protime 26.1  INR 2.2  Pt takes 7.5 mg all days except Mon, Wed, and Friday and then takes half tab.

## 2013-12-29 DIAGNOSIS — I872 Venous insufficiency (chronic) (peripheral): Secondary | ICD-10-CM | POA: Diagnosis not present

## 2013-12-29 DIAGNOSIS — L97909 Non-pressure chronic ulcer of unspecified part of unspecified lower leg with unspecified severity: Secondary | ICD-10-CM | POA: Diagnosis not present

## 2013-12-29 DIAGNOSIS — I10 Essential (primary) hypertension: Secondary | ICD-10-CM | POA: Diagnosis not present

## 2013-12-29 DIAGNOSIS — I4891 Unspecified atrial fibrillation: Secondary | ICD-10-CM

## 2013-12-29 DIAGNOSIS — L97309 Non-pressure chronic ulcer of unspecified ankle with unspecified severity: Secondary | ICD-10-CM

## 2013-12-29 DIAGNOSIS — I82409 Acute embolism and thrombosis of unspecified deep veins of unspecified lower extremity: Secondary | ICD-10-CM

## 2013-12-29 DIAGNOSIS — I890 Lymphedema, not elsewhere classified: Secondary | ICD-10-CM | POA: Diagnosis not present

## 2014-01-01 ENCOUNTER — Ambulatory Visit: Payer: Medicare Other | Admitting: Family Medicine

## 2014-01-03 ENCOUNTER — Ambulatory Visit (INDEPENDENT_AMBULATORY_CARE_PROVIDER_SITE_OTHER): Payer: Medicare Other | Admitting: Pharmacist

## 2014-01-03 ENCOUNTER — Telehealth: Payer: Self-pay | Admitting: *Deleted

## 2014-01-03 DIAGNOSIS — K625 Hemorrhage of anus and rectum: Secondary | ICD-10-CM

## 2014-01-03 DIAGNOSIS — Z86718 Personal history of other venous thrombosis and embolism: Secondary | ICD-10-CM

## 2014-01-03 LAB — POCT INR: INR: 2.4

## 2014-01-03 NOTE — Telephone Encounter (Signed)
Patient's wife and [name] with [facility] notified - continue current warfarin dose of 7.5mg  1/2 tablet MWF and 1 tablet all other days.  Recheck in 1 week (per [name] 01/11/14 will be their last home visit)

## 2014-01-03 NOTE — Telephone Encounter (Signed)
[name] at [facility], says wife reported that he had a moderate amt of rectal bleeding over the weekend. Bright red. He appears weaker today. Is there any blood work that you would like to order

## 2014-01-03 NOTE — Telephone Encounter (Signed)
PT 28.9  INR 2.4

## 2014-01-04 NOTE — Telephone Encounter (Signed)
Orders placed for CBC.

## 2014-01-04 NOTE — Telephone Encounter (Signed)
Called Tina at [facility] and gave order

## 2014-01-05 ENCOUNTER — Encounter (HOSPITAL_BASED_OUTPATIENT_CLINIC_OR_DEPARTMENT_OTHER): Payer: Medicare Other | Attending: General Surgery

## 2014-01-05 ENCOUNTER — Other Ambulatory Visit: Payer: Medicare Other

## 2014-01-05 DIAGNOSIS — L97309 Non-pressure chronic ulcer of unspecified ankle with unspecified severity: Secondary | ICD-10-CM | POA: Insufficient documentation

## 2014-01-05 DIAGNOSIS — L97909 Non-pressure chronic ulcer of unspecified part of unspecified lower leg with unspecified severity: Principal | ICD-10-CM

## 2014-01-05 DIAGNOSIS — L600 Ingrowing nail: Secondary | ICD-10-CM | POA: Insufficient documentation

## 2014-01-05 DIAGNOSIS — I87339 Chronic venous hypertension (idiopathic) with ulcer and inflammation of unspecified lower extremity: Secondary | ICD-10-CM | POA: Insufficient documentation

## 2014-01-05 DIAGNOSIS — L97809 Non-pressure chronic ulcer of other part of unspecified lower leg with unspecified severity: Secondary | ICD-10-CM | POA: Diagnosis not present

## 2014-01-10 ENCOUNTER — Ambulatory Visit (INDEPENDENT_AMBULATORY_CARE_PROVIDER_SITE_OTHER): Payer: Medicare Other | Admitting: Pharmacist

## 2014-01-10 ENCOUNTER — Telehealth: Payer: Self-pay | Admitting: *Deleted

## 2014-01-10 DIAGNOSIS — Z86718 Personal history of other venous thrombosis and embolism: Secondary | ICD-10-CM

## 2014-01-10 LAB — POCT INR: INR: 2.4

## 2014-01-10 NOTE — Telephone Encounter (Signed)
[facility], Liborio NixonJanice. INR 2.4, PT 28.6. Call back [phone]. Pt is scheduled for discharge today but the nurse thinks [facility] ned [facility]. Tammy can you approve this? [facility] said it is a hardship for him to come to office.

## 2014-01-10 NOTE — Telephone Encounter (Signed)
Sp[facility] with Joel [facility]Janice [facility] - [facility] per her supervisor.  No change in warfarin dose continue 7.5mg  1/2 tablet MWF and 1 tablet all other days. Patient's wife and [facility] nurse notified.  Appt made for patinet to check INR 02/08/14 at 3:20pm Reminded wife that if ABX started or s/s of bleeding will need to check sooner.

## 2014-01-11 ENCOUNTER — Other Ambulatory Visit: Payer: Self-pay | Admitting: Nurse Practitioner

## 2014-01-12 ENCOUNTER — Other Ambulatory Visit: Payer: Self-pay | Admitting: *Deleted

## 2014-01-12 DIAGNOSIS — L97809 Non-pressure chronic ulcer of other part of unspecified lower leg with unspecified severity: Secondary | ICD-10-CM | POA: Diagnosis not present

## 2014-01-12 DIAGNOSIS — I87339 Chronic venous hypertension (idiopathic) with ulcer and inflammation of unspecified lower extremity: Secondary | ICD-10-CM | POA: Diagnosis not present

## 2014-01-12 DIAGNOSIS — L97909 Non-pressure chronic ulcer of unspecified part of unspecified lower leg with unspecified severity: Secondary | ICD-10-CM | POA: Diagnosis not present

## 2014-01-12 DIAGNOSIS — L600 Ingrowing nail: Secondary | ICD-10-CM | POA: Diagnosis not present

## 2014-01-12 DIAGNOSIS — L97309 Non-pressure chronic ulcer of unspecified ankle with unspecified severity: Secondary | ICD-10-CM | POA: Diagnosis not present

## 2014-01-12 MED ORDER — FUROSEMIDE 40 MG PO TABS: 40.0000 mg | ORAL_TABLET | Freq: Every day | ORAL | Status: DC

## 2014-01-12 NOTE — Telephone Encounter (Signed)
Lasix 40mg  was filled for .5 tablets daily but he has been taking a whole tablet since returning home from the rehabilitation facility.  He will need to continue this strength. Med reordered and updated in EMR.

## 2014-01-17 ENCOUNTER — Telehealth: Payer: Self-pay | Admitting: Family Medicine

## 2014-01-17 ENCOUNTER — Ambulatory Visit (INDEPENDENT_AMBULATORY_CARE_PROVIDER_SITE_OTHER): Payer: Medicare Other | Admitting: Family Medicine

## 2014-01-17 ENCOUNTER — Encounter: Payer: Self-pay | Admitting: Family Medicine

## 2014-01-17 VITALS — BP 137/60 | HR 68 | Temp 98.6°F | Ht >= 80 in

## 2014-01-17 DIAGNOSIS — I82402 Acute embolism and thrombosis of unspecified deep veins of left lower extremity: Secondary | ICD-10-CM

## 2014-01-17 DIAGNOSIS — Z86718 Personal history of other venous thrombosis and embolism: Secondary | ICD-10-CM

## 2014-01-17 DIAGNOSIS — I82409 Acute embolism and thrombosis of unspecified deep veins of unspecified lower extremity: Secondary | ICD-10-CM

## 2014-01-17 DIAGNOSIS — K625 Hemorrhage of anus and rectum: Secondary | ICD-10-CM

## 2014-01-17 LAB — POCT INR: INR: 2.4

## 2014-01-17 MED ORDER — HYDROCORTISONE ACETATE 25 MG RE SUPP: 25.0000 mg | Freq: Every day | RECTAL | Status: DC

## 2014-01-17 MED ORDER — LORAZEPAM 0.5 MG PO TABS: 0.5000 mg | ORAL_TABLET | Freq: Every evening | ORAL | Status: DC | PRN

## 2014-01-17 NOTE — Patient Instructions (Signed)
Continue current dose and keep f/u appt on 02/08/14.

## 2014-01-17 NOTE — Progress Notes (Signed)
   Subjective:    Patient ID: [name], male    DOB: [birth year], 80 y.o.   MRN: [id number]  HPI 80 year old gentleman who is here today ostensibly because of some rectal bleeding. The bleeding is not a large amount but shows up in his diaper. He was hospitalized back in June and then was transferred to a nursing center and rageful because of weakness after being in the hospital for 7 days. There he underwent some physical therapy, draining of a knee effusion, and instillation of steroid into the knee. Since being at home his wife who is about one third the size of this gentleman has slept only 3 nights. Her seemed to be issues at night with his waking a lot requiring assistance to the bedside commode in general anxiety. There has been some question of dementia. Family has not been much help to the wife and she seems about to reach her breaking point as far as taking care of him. The alternative is to have more family participation, tired help specifically at night, her movement to assisted living. We spent a lot of time talking about these issues. I hope for the wife's sake that she will get some help soon He has multiple medical problems including abdominal aortic aneurysm peripheral vascular disease bilateral leg wounds paroxysmal atrial fibrillation COPD DVT and some mild dementia.    Review of Systems  Constitutional: Positive for fatigue (really more weakness as he is non-ambulatory).  Respiratory: Negative.   Cardiovascular: Positive for leg swelling.  Gastrointestinal: Negative.   Genitourinary: Negative.   Neurological: Negative.   Hematological: Negative.   Psychiatric/Behavioral: Positive for agitation (Mostly at night).       Objective:   Physical Exam  Constitutional: He is oriented to person, place, and time. He appears well-developed and well-nourished.  HENT:  Head: Normocephalic.  Right Ear: External ear normal.  Left Ear: External ear normal.  Nose: Nose normal.    Mouth/Throat: Oropharynx is clear and moist.  Eyes: Conjunctivae and EOM are normal. Pupils are equal, round, and reactive to light.  Neck: Normal range of motion. Neck supple.  Cardiovascular: Normal rate, regular rhythm, normal heart sounds and intact distal pulses.   Pulmonary/Chest: Effort normal and breath sounds normal.  Abdominal: Soft. Bowel sounds are normal.  Rectal exam -  int hemorrhoid  Musculoskeletal: Normal range of motion.  Neurological: He is alert and oriented to person, place, and time.  Skin: Skin is warm and dry.  Psychiatric: He has a normal mood and affect. His behavior is normal. Judgment and thought content normal.  Could not recall what he ate for supper yesterday but able to recall three words at 5 min   BP 137/60  Pulse 68  Temp(Src) 98.6 F (37 C) (Oral)  Ht 6\' 8"  (2.032 m)        Assessment & Plan:  1. Left leg DVT Check PT - POCT INR  2. Rectal bleeding Likely int hemorrhoid.  Have given Anusol HC.  Bleeding may also be AVM or polyp (had one 5 yrs ago)  3. Dementia  This is mild but he appears to be sundowning a bit.  Rx: Ativan but social situation caanot remain as is  Frederica KusterStephen M Lavanna Rog MD

## 2014-01-17 NOTE — Telephone Encounter (Signed)
I have just called in an alternative to there Anusol

## 2014-01-17 NOTE — Telephone Encounter (Signed)
Patient aware.

## 2014-01-17 NOTE — Telephone Encounter (Signed)
The rx cost over $200 can we call something cheaper in for them

## 2014-01-18 ENCOUNTER — Encounter: Payer: Self-pay | Admitting: Gastroenterology

## 2014-01-19 DIAGNOSIS — I87339 Chronic venous hypertension (idiopathic) with ulcer and inflammation of unspecified lower extremity: Secondary | ICD-10-CM | POA: Diagnosis not present

## 2014-01-19 DIAGNOSIS — L600 Ingrowing nail: Secondary | ICD-10-CM | POA: Diagnosis not present

## 2014-01-19 DIAGNOSIS — L97309 Non-pressure chronic ulcer of unspecified ankle with unspecified severity: Secondary | ICD-10-CM | POA: Diagnosis not present

## 2014-01-19 DIAGNOSIS — L97809 Non-pressure chronic ulcer of other part of unspecified lower leg with unspecified severity: Secondary | ICD-10-CM | POA: Diagnosis not present

## 2014-01-26 ENCOUNTER — Other Ambulatory Visit (HOSPITAL_BASED_OUTPATIENT_CLINIC_OR_DEPARTMENT_OTHER): Payer: Self-pay | Admitting: General Surgery

## 2014-01-26 ENCOUNTER — Ambulatory Visit (HOSPITAL_COMMUNITY)
Admission: RE | Admit: 2014-01-26 | Discharge: 2014-01-26 | Disposition: A | Discharge status: Home / Self Care | Payer: Medicare Other | Source: Ambulatory Visit | Attending: General Surgery | Admitting: General Surgery

## 2014-01-26 DIAGNOSIS — M869 Osteomyelitis, unspecified: Secondary | ICD-10-CM

## 2014-01-26 DIAGNOSIS — L600 Ingrowing nail: Secondary | ICD-10-CM | POA: Diagnosis not present

## 2014-01-26 DIAGNOSIS — I87339 Chronic venous hypertension (idiopathic) with ulcer and inflammation of unspecified lower extremity: Secondary | ICD-10-CM | POA: Diagnosis not present

## 2014-01-26 DIAGNOSIS — L97309 Non-pressure chronic ulcer of unspecified ankle with unspecified severity: Secondary | ICD-10-CM | POA: Diagnosis not present

## 2014-01-26 DIAGNOSIS — L97809 Non-pressure chronic ulcer of other part of unspecified lower leg with unspecified severity: Secondary | ICD-10-CM | POA: Diagnosis not present

## 2014-02-02 ENCOUNTER — Encounter (HOSPITAL_BASED_OUTPATIENT_CLINIC_OR_DEPARTMENT_OTHER): Payer: Medicare Other | Attending: General Surgery

## 2014-02-02 DIAGNOSIS — L97309 Non-pressure chronic ulcer of unspecified ankle with unspecified severity: Secondary | ICD-10-CM | POA: Insufficient documentation

## 2014-02-02 DIAGNOSIS — L97909 Non-pressure chronic ulcer of unspecified part of unspecified lower leg with unspecified severity: Secondary | ICD-10-CM | POA: Insufficient documentation

## 2014-02-02 DIAGNOSIS — I872 Venous insufficiency (chronic) (peripheral): Secondary | ICD-10-CM | POA: Insufficient documentation

## 2014-02-08 ENCOUNTER — Ambulatory Visit (INDEPENDENT_AMBULATORY_CARE_PROVIDER_SITE_OTHER): Payer: Medicare Other | Admitting: Pharmacist

## 2014-02-08 DIAGNOSIS — I82402 Acute embolism and thrombosis of unspecified deep veins of left lower extremity: Secondary | ICD-10-CM

## 2014-02-08 DIAGNOSIS — I82409 Acute embolism and thrombosis of unspecified deep veins of unspecified lower extremity: Secondary | ICD-10-CM

## 2014-02-08 DIAGNOSIS — Z86718 Personal history of other venous thrombosis and embolism: Secondary | ICD-10-CM

## 2014-02-08 LAB — POCT INR: INR: 3

## 2014-02-08 NOTE — Patient Instructions (Signed)
Anticoagulation Dose Instructions as of 02/08/2014     Glynis Smiles Tue Wed Thu Fri Sat   New Dose 7.5 mg 3.75 mg 7.5 mg 3.75 mg 7.5 mg 3.75 mg 7.5 mg    Description       Continue current warfarin dose of 1/2 tablet of 7.5mg  on mondays, wednesdays and fridays and 1 tablet all other day.        INR was 3.0 today

## 2014-02-09 DIAGNOSIS — I872 Venous insufficiency (chronic) (peripheral): Secondary | ICD-10-CM | POA: Diagnosis not present

## 2014-02-09 DIAGNOSIS — L97909 Non-pressure chronic ulcer of unspecified part of unspecified lower leg with unspecified severity: Secondary | ICD-10-CM | POA: Diagnosis not present

## 2014-02-09 DIAGNOSIS — L97309 Non-pressure chronic ulcer of unspecified ankle with unspecified severity: Secondary | ICD-10-CM | POA: Diagnosis not present

## 2014-02-16 DIAGNOSIS — I872 Venous insufficiency (chronic) (peripheral): Secondary | ICD-10-CM | POA: Diagnosis not present

## 2014-02-16 DIAGNOSIS — L97309 Non-pressure chronic ulcer of unspecified ankle with unspecified severity: Secondary | ICD-10-CM | POA: Diagnosis not present

## 2014-02-16 DIAGNOSIS — L97909 Non-pressure chronic ulcer of unspecified part of unspecified lower leg with unspecified severity: Secondary | ICD-10-CM | POA: Diagnosis not present

## 2014-02-20 ENCOUNTER — Telehealth: Payer: Self-pay | Admitting: Family Medicine

## 2014-02-20 NOTE — Telephone Encounter (Signed)
This was noted at internal hemmrhoid. Do you think we should see him back here or send him to GI  Please address and sent to POOL A to call pt back.

## 2014-02-21 ENCOUNTER — Telehealth: Payer: Self-pay | Admitting: Family Medicine

## 2014-02-21 ENCOUNTER — Encounter (HOSPITAL_COMMUNITY): Payer: Self-pay | Admitting: Emergency Medicine

## 2014-02-21 ENCOUNTER — Emergency Department (HOSPITAL_COMMUNITY)
Admission: EM | Admit: 2014-02-21 | Discharge: 2014-02-22 | Disposition: A | Discharge status: Home / Self Care | Payer: Medicare Other | Attending: Emergency Medicine | Admitting: Emergency Medicine

## 2014-02-21 ENCOUNTER — Telehealth: Payer: Self-pay | Admitting: Gastroenterology

## 2014-02-21 DIAGNOSIS — Z7901 Long term (current) use of anticoagulants: Secondary | ICD-10-CM | POA: Insufficient documentation

## 2014-02-21 DIAGNOSIS — Z86718 Personal history of other venous thrombosis and embolism: Secondary | ICD-10-CM | POA: Diagnosis not present

## 2014-02-21 DIAGNOSIS — H409 Unspecified glaucoma: Secondary | ICD-10-CM | POA: Diagnosis not present

## 2014-02-21 DIAGNOSIS — K644 Residual hemorrhoidal skin tags: Secondary | ICD-10-CM | POA: Insufficient documentation

## 2014-02-21 DIAGNOSIS — Z872 Personal history of diseases of the skin and subcutaneous tissue: Secondary | ICD-10-CM | POA: Insufficient documentation

## 2014-02-21 DIAGNOSIS — Z79899 Other long term (current) drug therapy: Secondary | ICD-10-CM | POA: Diagnosis not present

## 2014-02-21 DIAGNOSIS — K649 Unspecified hemorrhoids: Secondary | ICD-10-CM

## 2014-02-21 DIAGNOSIS — J449 Chronic obstructive pulmonary disease, unspecified: Secondary | ICD-10-CM | POA: Diagnosis not present

## 2014-02-21 DIAGNOSIS — F0390 Unspecified dementia without behavioral disturbance: Secondary | ICD-10-CM | POA: Insufficient documentation

## 2014-02-21 DIAGNOSIS — K648 Other hemorrhoids: Secondary | ICD-10-CM | POA: Insufficient documentation

## 2014-02-21 DIAGNOSIS — R197 Diarrhea, unspecified: Secondary | ICD-10-CM | POA: Insufficient documentation

## 2014-02-21 DIAGNOSIS — N400 Enlarged prostate without lower urinary tract symptoms: Secondary | ICD-10-CM | POA: Insufficient documentation

## 2014-02-21 DIAGNOSIS — J4489 Other specified chronic obstructive pulmonary disease: Secondary | ICD-10-CM | POA: Insufficient documentation

## 2014-02-21 DIAGNOSIS — Z8739 Personal history of other diseases of the musculoskeletal system and connective tissue: Secondary | ICD-10-CM | POA: Insufficient documentation

## 2014-02-21 DIAGNOSIS — Z87442 Personal history of urinary calculi: Secondary | ICD-10-CM | POA: Insufficient documentation

## 2014-02-21 DIAGNOSIS — K625 Hemorrhage of anus and rectum: Secondary | ICD-10-CM | POA: Diagnosis present

## 2014-02-21 LAB — URINALYSIS, ROUTINE W REFLEX MICROSCOPIC
Glucose, UA: NEGATIVE mg/dL
Ketones, ur: NEGATIVE mg/dL
Nitrite: NEGATIVE
Protein, ur: NEGATIVE mg/dL
SPECIFIC GRAVITY, URINE: 1.03 (ref 1.005–1.030)
Urobilinogen, UA: 0.2 mg/dL (ref 0.0–1.0)
pH: 5 (ref 5.0–8.0)

## 2014-02-21 LAB — CBC WITH DIFFERENTIAL/PLATELET
Basophils Absolute: 0 10*3/uL (ref 0.0–0.1)
Basophils Relative: 0 % (ref 0–1)
Eosinophils Absolute: 0.7 10*3/uL (ref 0.0–0.7)
Eosinophils Relative: 8 % — ABNORMAL HIGH (ref 0–5)
HCT: 38.3 % — ABNORMAL LOW (ref 39.0–52.0)
Hemoglobin: 12.5 g/dL — ABNORMAL LOW (ref 13.0–17.0)
LYMPHS ABS: 2.1 10*3/uL (ref 0.7–4.0)
LYMPHS PCT: 25 % (ref 12–46)
MCH: 28.3 pg (ref 26.0–34.0)
MCHC: 32.6 g/dL (ref 30.0–36.0)
MCV: 86.7 fL (ref 78.0–100.0)
Monocytes Absolute: 1.1 10*3/uL — ABNORMAL HIGH (ref 0.1–1.0)
Monocytes Relative: 12 % (ref 3–12)
NEUTROS PCT: 55 % (ref 43–77)
Neutro Abs: 4.8 10*3/uL (ref 1.7–7.7)
PLATELETS: 225 10*3/uL (ref 150–400)
RBC: 4.42 MIL/uL (ref 4.22–5.81)
RDW: 16 % — AB (ref 11.5–15.5)
WBC: 8.7 10*3/uL (ref 4.0–10.5)

## 2014-02-21 LAB — COMPREHENSIVE METABOLIC PANEL
ALT: 10 U/L (ref 0–53)
AST: 14 U/L (ref 0–37)
Albumin: 3.3 g/dL — ABNORMAL LOW (ref 3.5–5.2)
Alkaline Phosphatase: 86 U/L (ref 39–117)
Anion gap: 10 (ref 5–15)
BUN: 13 mg/dL (ref 6–23)
CO2: 27 meq/L (ref 19–32)
Calcium: 9.2 mg/dL (ref 8.4–10.5)
Chloride: 107 mEq/L (ref 96–112)
Creatinine, Ser: 0.92 mg/dL (ref 0.50–1.35)
GFR calc Af Amer: 90 mL/min — ABNORMAL LOW (ref >90)
GFR, EST NON AFRICAN AMERICAN: 78 mL/min — AB (ref >90)
GLUCOSE: 103 mg/dL — AB (ref 70–99)
Potassium: 4.1 mEq/L (ref 3.7–5.3)
SODIUM: 144 meq/L (ref 137–147)
TOTAL PROTEIN: 6.5 g/dL (ref 6.0–8.3)
Total Bilirubin: 0.4 mg/dL (ref 0.3–1.2)

## 2014-02-21 LAB — POC OCCULT BLOOD, ED: Fecal Occult Bld: POSITIVE — AB

## 2014-02-21 LAB — URINE MICROSCOPIC-ADD ON

## 2014-02-21 LAB — PROTIME-INR
INR: 2.58 — AB (ref 0.00–1.49)
PROTHROMBIN TIME: 27.7 s — AB (ref 11.6–15.2)

## 2014-02-21 NOTE — Telephone Encounter (Signed)
Left message on machine to call back  

## 2014-02-21 NOTE — Telephone Encounter (Signed)
Patients daughter called back, Otelia Santee at [phone].  Okay to speak to her per information in chart. Made patient appointment to see APP for first available because she is concerned with dads diarrhea, does not want him to get dehydrated. I advised patients daughter that if they fear dehydration then patient will need to go to the hospital. Patients daughter advised patient has fear of the hospital but if they have to go then they will. Patients daughter stated they may try to get him in earlier with PCP. Patients daughter will call and cancel appointment if they get in with PCP sooner or hospital.  Plus was told he may have a hemorrhoid that needs to be evaluated.

## 2014-02-21 NOTE — ED Notes (Signed)
Family states that pt has had multiple episodes of diarrhea however today pt has "about a cup of bright red blood". Pt reported to have a known internal hemorrhoid that they have been using cortisone suppositories for with little change. NAD noted.

## 2014-02-21 NOTE — Telephone Encounter (Signed)
Wife states that he has had bloody diarrhea since Monday and it seems to be getting worse. It has a terrible odor. Advised wife to take patient to ER for evaluation. Wife verbalized understanding

## 2014-02-21 NOTE — Telephone Encounter (Signed)
Spoke with daughter again and she states that she has called [facility] and made an appt for Sept 30. Advised daughter that if anything changes before the appt to take patient to the ER

## 2014-02-22 ENCOUNTER — Other Ambulatory Visit: Payer: Self-pay | Admitting: Family Medicine

## 2014-02-22 ENCOUNTER — Telehealth: Payer: Self-pay | Admitting: Family Medicine

## 2014-02-22 NOTE — Telephone Encounter (Signed)
This has been taken care of in another encounter.

## 2014-02-22 NOTE — Telephone Encounter (Signed)
Send to GI 

## 2014-02-22 NOTE — Discharge Instructions (Signed)
Bloody Stools Bloody stools often mean that there is a problem in the digestive tract. Your caregiver may use the term "melena" to describe black, tarry, and bad smelling stools or "hematochezia" to describe red or maroon-colored stools. Blood seen in the stool can be caused by bleeding anywhere along the intestinal tract.  A black stool usually means that blood is coming from the upper part of the gastrointestinal tract (esophagus, stomach, or small bowel). Passing maroon-colored stools or bright red blood usually means that blood is coming from lower down in the large bowel or the rectum. However, sometimes massive bleeding in the stomach or small intestine can cause bright red bloody stools.  Consuming black licorice, lead, iron pills, medicines containing bismuth subsalicylate, or blueberries can also cause black stools. Your caregiver can test black stools to see if blood is present. It is important that the cause of the bleeding be found. Treatment can then be started, and the problem can be corrected. Rectal bleeding may not be serious, but you should not assume everything is okay until you know the cause.It is very important to follow up with your caregiver or a specialist in gastrointestinal problems. CAUSES  Blood in the stools can come from various underlying causes.Often, the cause is not found during your first visit. Testing is often needed to discover the cause of bleeding in the gastrointestinal tract. Causes range from simple to serious or even life-threatening.Possible causes include:  Hemorrhoids.These are veins that are full of blood (engorged) in the rectum. They cause pain, inflammation, and may bleed.  Anal fissures.These are areas of painful tearing which may bleed. They are often caused by passing hard stool.  Diverticulosis.These are pouches that form on the colon over time, with age, and may bleed significantly.  Diverticulitis.This is inflammation in areas with  diverticulosis. It can cause pain, fever, and bloody stools, although bleeding is rare.  Proctitis and colitis. These are inflamed areas of the rectum or colon. They may cause pain, fever, and bloody stools.  Polyps and cancer. Colon cancer is a leading cause of preventable cancer death.It often starts out as precancerous polyps that can be removed during a colonoscopy, preventing progression into cancer. Sometimes, polyps and cancer may cause rectal bleeding.  Gastritis and ulcers.Bleeding from the upper gastrointestinal tract (near the stomach) may travel through the intestines and produce black, sometimes tarry, often bad smelling stools. In certain cases, if the bleeding is fast enough, the stools may not be black, but red and the condition may be life-threatening. SYMPTOMS  You may have stools that are bright red and bloody, that are normal color with blood on them, or that are dark black and tarry. In some cases, you may only have blood in the toilet bowl. Any of these cases need medical care. You may also have:  Pain at the anus or anywhere in the rectum.  Lightheadedness or feeling faint.  Extreme weakness.  Nausea or vomiting.  Fever. DIAGNOSIS Your caregiver may use the following methods to find the cause of your bleeding:  Taking a medical history. Age is important. Older people tend to develop polyps and cancer more often. If there is anal pain and a hard, large stool associated with bleeding, a tear of the anus may be the cause. If blood drips into the toilet after a bowel movement, bleeding hemorrhoids may be the problem. The color and frequency of the bleeding are additional considerations. In most cases, the medical history provides clues, but seldom the final  answer.  A visual and finger (digital) exam. Your caregiver will inspect the anal area, looking for tears and hemorrhoids. A finger exam can provide information when there is tenderness or a growth inside. In men, the  prostate is also examined.  Endoscopy. Several types of small, long scopes (endoscopes) are used to view the colon.  In the office, your caregiver may use a rigid, or more commonly, a flexible viewing sigmoidoscope. This exam is called flexible sigmoidoscopy. It is performed in 5 to 10 minutes.  A more thorough exam is accomplished with a colonoscope. It allows your caregiver to view the entire 5 to 6 foot long colon. Medicine to help you relax (sedative) is usually given for this exam. Frequently, a bleeding lesion may be present beyond the reach of the sigmoidoscope. So, a colonoscopy may be the best exam to start with. Both exams are usually done on an outpatient basis. This means the patient does not stay overnight in the hospital or surgery center.  An upper endoscopy may be needed to examine your stomach. Sedation is used and a flexible endoscope is put in your mouth, down to your stomach.  A barium enema X-ray. This is an X-ray exam. It uses liquid barium inserted by enema into the rectum. This test alone may not identify an actual bleeding point. X-rays highlight abnormal shadows, such as those made by lumps (tumors), diverticuli, or colitis. TREATMENT  Treatment depends on the cause of your bleeding.   For bleeding from the stomach or colon, the caregiver doing your endoscopy or colonoscopy may be able to stop the bleeding as part of the procedure.  Inflammation or infection of the colon can be treated with medicines.  Many rectal problems can be treated with creams, suppositories, or warm baths.  Surgery is sometimes needed.  Blood transfusions are sometimes needed if you have lost a lot of blood.  For any bleeding problem, let your caregiver know if you take aspirin or other blood thinners regularly. HOME CARE INSTRUCTIONS   Take any medicines exactly as prescribed.  Keep your stools soft by eating a diet high in fiber. Prunes (1 to 3 a day) work well for many people.  Drink  enough water and fluids to keep your urine clear or pale yellow.  Take sitz baths if advised. A sitz bath is when you sit in a bathtub with warm water for 10 to 15 minutes to soak, soothe, and cleanse the rectal area.  If enemas or suppositories are advised, be sure you know how to use them. Tell your caregiver if you have problems with this.  Monitor your bowel movements to look for signs of improvement or worsening. SEEK MEDICAL CARE IF:   You do not improve in the time expected.  Your condition worsens after initial improvement.  You develop any new symptoms. SEEK IMMEDIATE MEDICAL CARE IF:   You develop severe or prolonged rectal bleeding.  You vomit blood.  You feel weak or faint.  You have a fever. MAKE SURE YOU:  Understand these instructions.  Will watch your condition.  Will get help right away if you are not doing well or get worse. Document Released: 05/08/2002 Document Revised: 08/10/2011 Document Reviewed: 10/03/2010 [facility] Patient Information 2015 [place], [place]. This information is not intended to replace advice given to you by your health care provider. Make sure you discuss any questions you have with your health care provider. Diarrhea Diarrhea is frequent loose and watery bowel movements. It can cause you  to feel weak and dehydrated. Dehydration can cause you to become tired and thirsty, have a dry mouth, and have decreased urination that often is dark yellow. Diarrhea is a sign of another problem, most often an infection that will not last long. In most cases, diarrhea typically lasts 2-3 days. However, it can last longer if it is a sign of something more serious. It is important to treat your diarrhea as directed by your caregiver to lessen or prevent future episodes of diarrhea. CAUSES  Some common causes include:  Gastrointestinal infections caused by viruses, bacteria, or parasites.  Food poisoning or food allergies.  Certain medicines, such as  antibiotics, chemotherapy, and laxatives.  Artificial sweeteners and fructose.  Digestive disorders. HOME CARE INSTRUCTIONS  Ensure adequate fluid intake (hydration): Have 1 cup (8 oz) of fluid for each diarrhea episode. Avoid fluids that contain simple sugars or sports drinks, fruit juices, whole milk products, and sodas. Your urine should be clear or pale yellow if you are drinking enough fluids. Hydrate with an oral rehydration solution that you can purchase at pharmacies, retail stores, and online. You can prepare an oral rehydration solution at home by mixing the following ingredients together:   - tsp table salt.   tsp baking soda.   tsp salt substitute containing potassium chloride.  1  tablespoons sugar.  1 L (34 oz) of water.  Certain foods and beverages may increase the speed at which food moves through the gastrointestinal (GI) tract. These foods and beverages should be avoided and include:  Caffeinated and alcoholic beverages.  High-fiber foods, such as raw fruits and vegetables, nuts, seeds, and whole grain breads and cereals.  Foods and beverages sweetened with sugar alcohols, such as xylitol, sorbitol, and mannitol.  Some foods may be well tolerated and may help thicken stool including:  Starchy foods, such as rice, toast, pasta, low-sugar cereal, oatmeal, grits, baked potatoes, crackers, and bagels.  Bananas.  Applesauce.  Add probiotic-rich foods to help increase healthy bacteria in the GI tract, such as yogurt and fermented milk products.  Wash your hands well after each diarrhea episode.  Only take over-the-counter or prescription medicines as directed by your caregiver.  Take a warm bath to relieve any burning or pain from frequent diarrhea episodes. SEEK IMMEDIATE MEDICAL CARE IF:   You are unable to keep fluids down.  You have persistent vomiting.  You have blood in your stool, or your stools are black and tarry.  You do not urinate in 6-8  hours, or there is only a small amount of very dark urine.  You have abdominal pain that increases or localizes.  You have weakness, dizziness, confusion, or light-headedness.  You have a severe headache.  Your diarrhea gets worse or does not get better.  You have a fever or persistent symptoms for more than 2-3 days.  You have a fever and your symptoms suddenly get worse. MAKE SURE YOU:   Understand these instructions.  Will watch your condition.  Will get help right away if you are not doing well or get worse. Document Released: 05/08/2002 Document Revised: 10/02/2013 Document Reviewed: 01/24/2012 [facility] Patient Information 2015 [place], [place]. This information is not intended to replace advice given to you by your health care provider. Make sure you discuss any questions you have with your health care provider.

## 2014-02-22 NOTE — Telephone Encounter (Signed)
Spoke with daughter and notified her that we will need a hippa form filled out so we can speak with daughter. She is aware that she can come by a get stool specimen containers if she would like.

## 2014-02-22 NOTE — ED Provider Notes (Signed)
CSN: [id number]     Arrival date & time 02/21/14  1821 History   First MD Initiated Contact with Patient 02/21/14 2152     Chief Complaint  Patient presents with  . Rectal Bleeding  . Diarrhea     (Consider location/radiation/quality/duration/timing/severity/associated sxs/prior Treatment) Patient is a 80 y.o. male presenting with hematochezia and diarrhea.  Rectal Bleeding Quality:  Bright red and maroon Amount:  Moderate Duration:  1 day Timing:  Constant Progression:  Unchanged Chronicity:  New Context: diarrhea and hemorrhoids   Context: not constipation, not defecation, not rectal injury and not rectal pain   Context comment:  Intermittent constipation and diarrhea, has GI appoitnment for same in 1 week Relieved by:  Nothing Worsened by:  Nothing tried Ineffective treatments:  None tried Associated symptoms: no abdominal pain, no fever, no hematemesis and no loss of consciousness   Diarrhea Quality:  Malodorous Severity:  Moderate Onset quality:  Gradual Duration:  3 days Timing:  Constant Progression:  Unchanged Relieved by:  Nothing Associated symptoms: no abdominal pain, no chills, no recent cough, no diaphoresis and no fever     Past Medical History  Diagnosis Date  . Venous insufficiency   . AAA (abdominal aortic aneurysm)   . Ulcer   . DVT (deep venous thrombosis) 05/04/2013    "LLE"  . Cellulitis   . Nicotine dependence   . Bronchospasm   . Elevated PSA   . BPH (benign prostatic hyperplasia)   . COPD (chronic obstructive pulmonary disease)   . Cognitive impairment   . Daily headache   . Arthritis     "probably on the right leg/hip" (09/20/2013)  . Dementia     "don't know exactly what kind" (09/20/2013)  . Kidney stones   . Glaucoma, both eyes    Past Surgical History  Procedure Laterality Date  . Cataract extraction w/ intraocular lens  implant, bilateral Bilateral   . Insitu percutaneous pinning femur Right 1986  . Hip fracture surgery Right  2009  . Back surgery    . Posterior laminectomy / decompression lumbar spine  2004    Decompressive laminectomy unilaterally on the right at L4-5 with  . Cholecystectomy  2011  . Femur fracture surgery Right 04/1985  . Cystoscopy w/ stone manipulation  1970's    "once"   Family History  Problem Relation Age of Onset  . Aneurysm Brother   . Heart attack Brother     10-29-12  . Cancer Father     colon   History  Substance Use Topics  . Smoking status: Former Smoker -- 2.00 packs/day for 54 years    Types: Cigarettes    Quit date: 08/24/2002  . Smokeless tobacco: Current User    Types: Snuff     Comment: 09/20/2013 "dips snuff"  . Alcohol Use: No    Review of Systems  Constitutional: Negative for fever, chills and diaphoresis.  Gastrointestinal: Positive for diarrhea and hematochezia. Negative for abdominal pain and hematemesis.  Neurological: Negative for loss of consciousness.  All other systems reviewed and are negative.     Allergies  Oxycodone hcl and Propoxyphene n-acetaminophen  Home Medications   Prior to Admission medications   Medication Sig Start Date End Date Taking? Authorizing Provider  acetaminophen (TYLENOL) 325 MG tablet Take 650 mg by mouth every 6 (six) hours as needed for mild pain, fever or headache.    Yes Historical Provider, MD  albuterol (PROVENTIL HFA;VENTOLIN HFA) 108 (90 BASE) MCG/ACT inhaler Inhale 2 puffs  into the lungs every 6 (six) hours as needed for wheezing or shortness of breath. 09/25/13  Yes Ripudeep Jenna Luo, MD  cyanocobalamin 1000 MCG tablet Take 1 tablet (1,000 mcg total) by mouth daily. 12/09/13  Yes Mary-Margaret Daphine Deutscher, FNP  diltiazem (CARDIZEM CD) 120 MG 24 hr capsule Take 1 capsule (120 mg total) by mouth daily. 12/09/13  Yes Mary-Margaret Daphine Deutscher, FNP  doxazosin (CARDURA) 8 MG tablet Take 0.5 tablets (4 mg total) by mouth at bedtime. 09/15/13  Yes Ileana Ladd, MD  finasteride (PROSCAR) 5 MG tablet Take 5 mg by mouth at bedtime.     Yes Historical Provider, MD  furosemide (LASIX) 40 MG tablet Take 1 tablet (40 mg total) by mouth daily. 01/12/14  Yes Junie Spencer, FNP  latanoprost (XALATAN) 0.005 % ophthalmic solution Place 1 drop into both eyes at bedtime.  01/25/12  Yes Historical Provider, MD  potassium chloride SA (K-DUR,KLOR-CON) 10 MEQ tablet Take 1 tablet (10 mEq total) by mouth daily. 11/15/13  Yes Ernestina Penna, MD  traMADol (ULTRAM) 50 MG tablet Take 1 tablet (50 mg total) by mouth every 6 (six) hours as needed for severe pain. 09/26/13  Yes Mahima Glade Lloyd, MD  warfarin (COUMADIN) 7.5 MG tablet Take 3.75-7.5 mg by mouth daily at 6 PM. Take 1/2(3.75) tablet on Monday Wednesday Friday and  7.5mg  Sunday Tuesday Thursday Saturday 09/08/13  Yes Deatra Canter, FNP   BP 137/52  Pulse 64  Temp(Src) 97.8 F (36.6 C) (Oral)  Resp 19  Wt 325 lb (147.419 kg)  SpO2 97% Physical Exam  Vitals reviewed. Constitutional: He is oriented to person, place, and time. He appears well-developed and well-nourished.  HENT:  Head: Normocephalic and atraumatic.  Eyes: Conjunctivae and EOM are normal.  Neck: Normal range of motion. Neck supple.  Cardiovascular: Normal rate, regular rhythm and normal heart sounds.   Pulmonary/Chest: Effort normal and breath sounds normal. No respiratory distress.  Abdominal: He exhibits no distension. There is no tenderness. There is no rebound and no guarding.  Genitourinary: Prostate normal. Rectal exam shows external hemorrhoid and internal hemorrhoid. Rectal exam shows no fissure, no mass, no tenderness and anal tone normal. Guaiac positive stool.  Obvious external hemorrhoid with pinpoint hemorrhage spot.  No gross blood.    Musculoskeletal: Normal range of motion.  Neurological: He is alert and oriented to person, place, and time.  Skin: Skin is warm and dry.    ED Course  Procedures (including critical care time) Labs Review Labs Reviewed  CBC WITH DIFFERENTIAL - Abnormal; Notable for the  following:    Hemoglobin 12.5 (*)    HCT 38.3 (*)    RDW 16.0 (*)    Monocytes Absolute 1.1 (*)    Eosinophils Relative 8 (*)    All other components within normal limits  COMPREHENSIVE METABOLIC PANEL - Abnormal; Notable for the following:    Glucose, Bld 103 (*)    Albumin 3.3 (*)    GFR calc non Af Amer 78 (*)    GFR calc Af Amer 90 (*)    All other components within normal limits  URINALYSIS, ROUTINE W REFLEX MICROSCOPIC - Abnormal; Notable for the following:    Color, Urine AMBER (*)    APPearance CLOUDY (*)    Hgb urine dipstick SMALL (*)    Bilirubin Urine SMALL (*)    Leukocytes, UA TRACE (*)    All other components within normal limits  PROTIME-INR - Abnormal; Notable for the following:  Prothrombin Time 27.7 (*)    INR 2.58 (*)    All other components within normal limits  URINE MICROSCOPIC-ADD ON - Abnormal; Notable for the following:    Bacteria, UA FEW (*)    Crystals CA OXALATE CRYSTALS (*)    All other components within normal limits  POC OCCULT BLOOD, ED - Abnormal; Notable for the following:    Fecal Occult Bld POSITIVE (*)    All other components within normal limits    Imaging Review No results found.   EKG Interpretation None      MDM   Final diagnoses:  Diarrhea  Rectal bleeding  Hemorrhoids, unspecified hemorrhoid type    80 y.o. male with pertinent PMH of AAA, venous insufficiency, dvt on coumadin, chronic intermittent diarrhea and constipation, dementia presents with hematochezia x 1 BM today.  Pt has had recurrent diarrhea x 3 days, today passed 1 blood clot followed by [facility] x 1 bm without recurrence.  His rectal exam is as above consistent with hemorrhoidal bleed, no signs of significant GI bleeding.  He consistently has no abd tenderness on repeat examinations.  Labs with stable hgb.  Pt has fu with gi.  INR therapeutic.  As pt has obvious source of bleeding, is well appearing, and has GI fu for diarrhea in 1 week, will dc home with  strict return precautions. Doubt diverticulitis, SBO, illeus, volvulus, MI, AAA, or other pathology given clinical scenario.  Family agrees with plan.     1. Diarrhea   2. Rectal bleeding   3. Hemorrhoids, unspecified hemorrhoid type         Mirian Mo, MD 02/22/14 1520

## 2014-02-23 DIAGNOSIS — I872 Venous insufficiency (chronic) (peripheral): Secondary | ICD-10-CM | POA: Diagnosis not present

## 2014-02-23 DIAGNOSIS — L97909 Non-pressure chronic ulcer of unspecified part of unspecified lower leg with unspecified severity: Secondary | ICD-10-CM | POA: Diagnosis not present

## 2014-02-23 DIAGNOSIS — L97309 Non-pressure chronic ulcer of unspecified ankle with unspecified severity: Secondary | ICD-10-CM | POA: Diagnosis not present

## 2014-02-28 ENCOUNTER — Ambulatory Visit: Payer: Medicare Other | Admitting: Nurse Practitioner

## 2014-03-01 ENCOUNTER — Telehealth: Payer: Self-pay | Admitting: Pharmacist

## 2014-03-01 ENCOUNTER — Encounter (HOSPITAL_BASED_OUTPATIENT_CLINIC_OR_DEPARTMENT_OTHER): Payer: Medicare Other | Attending: General Surgery

## 2014-03-01 DIAGNOSIS — L97919 Non-pressure chronic ulcer of unspecified part of right lower leg with unspecified severity: Secondary | ICD-10-CM | POA: Insufficient documentation

## 2014-03-01 DIAGNOSIS — L97329 Non-pressure chronic ulcer of left ankle with unspecified severity: Secondary | ICD-10-CM | POA: Insufficient documentation

## 2014-03-01 DIAGNOSIS — I87333 Chronic venous hypertension (idiopathic) with ulcer and inflammation of bilateral lower extremity: Secondary | ICD-10-CM | POA: Insufficient documentation

## 2014-03-01 NOTE — Telephone Encounter (Signed)
received fax from [facility] requesting hold warfarin prior to colonoscopy scheduled for 03/07/2014.  Paperwork filled out and faxed back to [facility].  I also contacted Mrs. Jennette Kettleeal and instructed her that patient's last warfarin dose will be Saturday, October 3rd and he will hold until after colonoscopy.  If no polyps removed restart 10/8 but if polyps removed restart 10/9.

## 2014-03-02 ENCOUNTER — Encounter (HOSPITAL_BASED_OUTPATIENT_CLINIC_OR_DEPARTMENT_OTHER): Payer: Medicare Other

## 2014-03-05 ENCOUNTER — Encounter: Payer: Self-pay | Admitting: Family

## 2014-03-06 ENCOUNTER — Ambulatory Visit (INDEPENDENT_AMBULATORY_CARE_PROVIDER_SITE_OTHER): Payer: Medicare Other | Admitting: Family

## 2014-03-06 ENCOUNTER — Encounter: Payer: Self-pay | Admitting: Family

## 2014-03-06 ENCOUNTER — Ambulatory Visit (HOSPITAL_COMMUNITY)
Admission: RE | Admit: 2014-03-06 | Discharge: 2014-03-06 | Disposition: A | Discharge status: Home / Self Care | Payer: Medicare Other | Source: Ambulatory Visit | Attending: Family | Admitting: Family

## 2014-03-06 VITALS — BP 127/64 | HR 65 | Resp 16 | Ht 79.0 in | Wt 325.0 lb

## 2014-03-06 DIAGNOSIS — I714 Abdominal aortic aneurysm, without rupture, unspecified: Secondary | ICD-10-CM

## 2014-03-06 DIAGNOSIS — M79609 Pain in unspecified limb: Secondary | ICD-10-CM

## 2014-03-06 DIAGNOSIS — R609 Edema, unspecified: Secondary | ICD-10-CM

## 2014-03-06 NOTE — Patient Instructions (Addendum)
Abdominal Aortic Aneurysm An aneurysm is a weakened or damaged part of an artery wall that bulges from the normal force of blood pumping through the body. An abdominal aortic aneurysm is an aneurysm that occurs in the lower part of the aorta, the main artery of the body.  The major concern with an abdominal aortic aneurysm is that it can enlarge and burst (rupture) or blood can flow between the layers of the wall of the aorta through a tear (aorticdissection). Both of these conditions can cause bleeding inside the body and can be life threatening unless diagnosed and treated promptly. CAUSES  The exact cause of an abdominal aortic aneurysm is unknown. Some contributing factors are:   A hardening of the arteries caused by the buildup of fat and other substances in the lining of a blood vessel (arteriosclerosis).  Inflammation of the walls of an artery (arteritis).   Connective tissue diseases, such as Marfan syndrome.   Abdominal trauma.   An infection, such as syphilis or staphylococcus, in the wall of the aorta (infectious aortitis) caused by bacteria. RISK FACTORS  Risk factors that contribute to an abdominal aortic aneurysm may include:  Age older than 60 years.   High blood pressure (hypertension).  Male gender.  Ethnicity (white race).  Obesity.  Family history of aneurysm (first degree relatives only).  Tobacco use. PREVENTION  The following healthy lifestyle habits may help decrease your risk of abdominal aortic aneurysm:  Quitting smoking. Smoking can raise your blood pressure and cause arteriosclerosis.  Limiting or avoiding alcohol.  Keeping your blood pressure, blood sugar level, and cholesterol levels within normal limits.  Decreasing your salt intake. In somepeople, too much salt can raise blood pressure and increase your risk of abdominal aortic aneurysm.  Eating a diet low in saturated fats and cholesterol.  Increasing your fiber intake by including  whole grains, vegetables, and fruits in your diet. Eating these foods may help lower blood pressure.  Maintaining a healthy weight.  Staying physically active and exercising regularly. SYMPTOMS  The symptoms of abdominal aortic aneurysm may vary depending on the size and rate of growth of the aneurysm.Most grow slowly and do not have any symptoms. When symptoms do occur, they may include:  Pain (abdomen, side, lower back, or groin). The pain may vary in intensity. A sudden onset of severe pain may indicate that the aneurysm has ruptured.  Feeling full after eating only small amounts of food.  Nausea or vomiting or both.  Feeling a pulsating lump in the abdomen.  Feeling faint or passing out. DIAGNOSIS  Since most unruptured abdominal aortic aneurysms have no symptoms, they are often discovered during diagnostic exams for other conditions. An aneurysm may be found during the following procedures:  Ultrasonography (A one-time screening for abdominal aortic aneurysm by ultrasonography is also recommended for all men aged 65-75 years who have ever smoked).  X-ray exams.  A computed tomography (CT).  Magnetic resonance imaging (MRI).  Angiography or arteriography. TREATMENT  Treatment of an abdominal aortic aneurysm depends on the size of your aneurysm, your age, and risk factors for rupture. Medication to control blood pressure and pain may be used to manage aneurysms smaller than 6 cm. Regular monitoring for enlargement may be recommended by your caregiver if:  The aneurysm is 3-4 cm in size (an annual ultrasonography may be recommended).  The aneurysm is 4-4.5 cm in size (an ultrasonography every 6 months may be recommended).  The aneurysm is larger than 4.5 cm in   size (your caregiver may ask that you be examined by a vascular surgeon). If your aneurysm is larger than 6 cm, surgical repair may be recommended. There are two main methods for repair of an aneurysm:   Endovascular  repair (a minimally invasive surgery). This is done most often.  Open repair. This method is used if an endovascular repair is not possible. Document Released: 02/25/2005 Document Revised: 09/12/2012 Document Reviewed: 06/17/2012 [facility] Patient Information 2015 [place], [place]. This information is not intended to replace advice given to you by your health care provider. Make sure you discuss any questions you have with your health care provider.   Venous Stasis or Chronic Venous Insufficiency Chronic venous insufficiency, also called venous stasis, is a condition that affects the veins in the legs. The condition prevents blood from being pumped through these veins effectively. Blood may no longer be pumped effectively from the legs back to the heart. This condition can range from mild to severe. With proper treatment, you should be able to continue with an active life. CAUSES  Chronic venous insufficiency occurs when the vein walls become stretched, weakened, or damaged or when valves within the vein are damaged. Some common causes of this include:  High blood pressure inside the veins (venous hypertension).  Increased blood pressure in the leg veins from long periods of sitting or standing.  A blood clot that blocks blood flow in a vein (deep vein thrombosis).  Inflammation of a superficial vein (phlebitis) that causes a blood clot to form. RISK FACTORS Various things can make you more likely to develop chronic venous insufficiency, including:  Family history of this condition.  Obesity.  Pregnancy.  Sedentary lifestyle.  Smoking.  Jobs requiring long periods of standing or sitting in one place.  Being a certain age. Women in their 1s and 1s and men in their 61s are more likely to develop this condition. SIGNS AND SYMPTOMS  Symptoms may include:   Varicose veins.  Skin breakdown or ulcers.  Reddened or discolored skin on the leg.  Brown, smooth, tight, and painful  skin just above the ankle, usually on the inside surface (lipodermatosclerosis).  Swelling. DIAGNOSIS  To diagnose this condition, your health care provider will take a medical history and do a physical exam. The following tests may be ordered to confirm the diagnosis:  Duplex ultrasound--A procedure that produces a picture of a blood vessel and nearby organs and also provides information on blood flow through the blood vessel.  Plethysmography--A procedure that tests blood flow.  A venogram, or venography--A procedure used to look at the veins using X-ray and dye. TREATMENT The goals of treatment are to help you return to an active life and to minimize pain or disability. Treatment will depend on the severity of the condition. Medical procedures may be needed for severe cases. Treatment options may include:   Use of compression stockings. These can help with symptoms and lower the chances of the problem getting worse, but they do not cure the problem.  Sclerotherapy--A procedure involving an injection of a material that "dissolves" the damaged veins. Other veins in the network of blood vessels take over the function of the damaged veins.  Surgery to remove the vein or cut off blood flow through the vein (vein stripping or laser ablation surgery).  Surgery to repair a valve. HOME CARE INSTRUCTIONS   Wear compression stockings as directed by your health care provider.  Only take over-the-counter or prescription medicines for pain, discomfort, or fever as  directed by your health care provider.  Follow up with your health care provider as directed. SEEK MEDICAL CARE IF:   You have redness, swelling, or increasing pain in the affected area.  You see a red streak or line that extends up or down from the affected area.  You have a breakdown or loss of skin in the affected area, even if the breakdown is small.  You have an injury to the affected area. SEEK IMMEDIATE MEDICAL CARE IF:    You have an injury and open wound in the affected area.  Your pain is severe and does not improve with medicine.  You have sudden numbness or weakness in the foot or ankle below the affected area, or you have trouble moving your foot or ankle.  You have a fever or persistent symptoms for more than 2-3 days.  You have a fever and your symptoms suddenly get worse. MAKE SURE YOU:   Understand these instructions.  Will watch your condition.  Will get help right away if you are not doing well or get worse. Document Released: 09/21/2006 Document Revised: 03/08/2013 Document Reviewed: 01/23/2013 [facility] Patient Information 2015 [employer]. This information is not intended to replace advice given to you by your health care provider. Make sure you discuss any questions you have with your health care provider.

## 2014-03-06 NOTE — Progress Notes (Signed)
[facility] [place]  Established Abdominal Aortic Aneurysm  History of Present Illness  [name] is a 80 y.o. (1932-05-22) male who presents for evaluation of his infrarenal aneurys. He also has lower extremity venous stasis disease. He is well known to Dr. Arbie Cookey.  He is not using compression stockings but he is using ace wraps 24/7, does not take off at night; his PCP assists with UNA boot at times when he develops venous stasis ulcers.  The patient does have chronic back pain but no acute onset, no abdominal pain. The patient is not a smoker, stopped years ago, and has stopped oral snuff since September 18, 2013.  The patient has pain in his legs with walking, he is evasive when asked about pain in legs, but the legs discomfort seems more related to his venous stasis and pressure in his legs than arterial claudication, he rarely walks; he fell September 18, 2013, was in rehab, he states he is not motivated and he also has swelling in his legs, states he is also nervous about another fall. He goes to the wound center weekly for left ankle ulcer, right ankle ulcer is healing, per daughter, Dr. Ardath Sax. He has a colonoscopy scheduled for tomorrow to evaluate rectal bleeding. He is off his coumadin in preparation for this.  Pt Diabetic: No  Pt smoker: non-smoker, stopped oral snuff April, 2015.  The patient denies history of stroke or TIA symptoms.   Past Medical History  Diagnosis Date  . Venous insufficiency   . AAA (abdominal aortic aneurysm)   . Ulcer   . DVT (deep venous thrombosis) 05/04/2013    "LLE"  . Cellulitis   . Nicotine dependence   . Bronchospasm   . Elevated PSA   . BPH (benign prostatic hyperplasia)   . COPD (chronic obstructive pulmonary disease)   . Cognitive impairment   . Daily headache   . Arthritis     "probably on the right leg/hip" (09/20/2013)  . Dementia     "don't know exactly what kind" (09/20/2013)  . Kidney stones   . Glaucoma,  both eyes    Past Surgical History  Procedure Laterality Date  . Cataract extraction w/ intraocular lens  implant, bilateral Bilateral   . Insitu percutaneous pinning femur Right 1986  . Hip fracture surgery Right 2009  . Back surgery    . Posterior laminectomy / decompression lumbar spine  2004    Decompressive laminectomy unilaterally on the right at L4-5 with  . Cholecystectomy  2011  . Femur fracture surgery Right 04/1985  . Cystoscopy w/ stone manipulation  1970's    "once"   Social History History   Social History  . Marital Status: Married    Spouse Name: N/A    Number of Children: N/A  . Years of Education: N/A   Occupational History  . Not on file.   Social History Main Topics  . Smoking status: Former Smoker -- 2.00 packs/day for 54 years    Types: Cigarettes    Quit date: 08/24/2002  . Smokeless tobacco: Current User    Types: Snuff     Comment: 09/20/2013 "dips snuff"  . Alcohol Use: No  . Drug Use: No  . Sexual Activity: Not Currently   Other Topics Concern  . Not on file   Social History Narrative  . No narrative on file   Family History Family History  Problem Relation Age of Onset  . Aneurysm Brother   .  Heart attack Brother     10-29-12  . Cancer Father     colon    Current Outpatient Prescriptions on File Prior to Visit  Medication Sig Dispense Refill  . cyanocobalamin 1000 MCG tablet Take 1 tablet (1,000 mcg total) by mouth daily.      [name]. diltiazem (CARDIZEM CD) 120 MG 24 hr capsule Take 1 capsule (120 mg total) by mouth daily.  30 capsule  1  . doxazosin (CARDURA) 8 MG tablet Take 0.5 tablets (4 mg total) by mouth at bedtime.      . finasteride (PROSCAR) 5 MG tablet Take 5 mg by mouth at bedtime.       . furosemide (LASIX) 40 MG tablet Take 1 tablet (40 mg total) by mouth daily.  30 tablet  3  . latanoprost (XALATAN) 0.005 % ophthalmic solution Place 1 drop into both eyes at bedtime.       . potassium chloride (K-DUR) 10 MEQ tablet TAKE 1  TABLET DAILY  30 tablet  5  . warfarin (COUMADIN) 7.5 MG tablet Take 3.75-7.5 mg by mouth daily at 6 PM. Take 1/2(3.75) tablet on Monday Wednesday Friday and  7.5mg  Sunday Tuesday Thursday Saturday      . acetaminophen (TYLENOL) 325 MG tablet Take 650 mg by mouth every 6 (six) hours as needed for mild pain, fever or headache.       . albuterol (PROVENTIL HFA;VENTOLIN HFA) 108 (90 BASE) MCG/ACT inhaler Inhale 2 puffs into the lungs every 6 (six) hours as needed for wheezing or shortness of breath.  1 Inhaler  2  . traMADol (ULTRAM) 50 MG tablet Take 1 tablet (50 mg total) by mouth every 6 (six) hours as needed for severe pain.  120 tablet  5  . [DISCONTINUED] potassium chloride SA (K-DUR,KLOR-CON) 10 MEQ tablet Take 1 tablet (10 mEq total) by mouth daily.  30 tablet  2   No current facility-administered medications on file prior to visit.   Allergies  Allergen Reactions  . Oxycodone Hcl     REACTION: makes pt "wild"  . Propoxyphene N-Acetaminophen     REACTION: Makes pt. "wild"    ROS: See HPI for pertinent positives and negatives.  Physical Examination  Filed Vitals:   03/06/14 0944  BP: 127/64  Pulse: 65  Resp: 16  Height: 6\' 7"  (2.007 m)  Weight: 325 lb (147.419 kg)  SpO2: 96%   Body mass index is 36.6 kg/(m^2).  General: A&O x 3, WD, obese male, using cane.  Pulmonary: Sym exp, good air movt, CTAB, no rales, rhonchi, or wheezing.  Cardiac: RRR, positive murmur.  Carotid Bruits  Left  Right    Transmitted cardiac murmur Transmitted cardiac murmur   Aorta is not palpable, large panus  Radial pulses are 2+ palpable.   VASCULAR EXAM:  LE Pulses  LEFT  RIGHT   POPLITEAL  not palpable  not palpable   POSTERIOR TIBIAL  not palpable  not palpable   DORSALIS PEDIS  ANTERIOR TIBIAL  Not palpable  Not palpable    Gastrointestinal: soft, NTND, -G/R, - HSM, - masses palpated, large pannus.  Musculoskeletal: M/S 5/5 in upper extremities, 4/5 in lower extremities, Extremities  without ischemic changes. He does have chronic venous stasis changes in both LE's: he has compression dressings in both lower legs. Neurologic: CN 2-12 intact, Pain and light touch intact in extremities, Motor exam as listed above.   Non-Invasive Vascular Imaging  AAA Duplex (03/07/2013)  Previous size: 3.5 cm (Date:  03/01/2012)  Current size: 4.28 cm (Date: 03/07/2013)   Medical Decision Making  The patient is a 80 y.o. male who presents with asymptomatic AAA, unable to obtain any Duplex measurements today due to bowel gas. Largest AAA measurement a year ago was 4.28 cm, the year before that was 3.5 cm. His creatinine was WNL last month. He is scheduled for a colonoscopy tomorrow to evaluate rectal bleeding. Discussed the above with Dr. Arbie Cookey.   Based on this patient's exam and diagnostic studies, the patient will follow up in 1-2 weeks with the following studies: CTA abdomen/pelvis for AAA evaluation and follow up with me in 1-2 weeks.  Consideration for repair of AAA would be made when the size approaches 4.8 or 5.0 cm, growth > 1 cm/yr, and symptomatic status.  I emphasized the importance of maximal medical management including strict control of blood pressure, blood glucose, and lipid levels, antiplatelet agents, obtaining regular exercise, and continued cessation of smoking.   The patient was given information about AAA including signs, symptoms, treatment, and how to minimize the risk of enlargement and rupture of aneurysms.    The patient was advised to call 911 should the patient experience sudden onset abdominal or back pain.   Thank you for allowing [place] to participate in this patient's care.  Charisse March, RN, MSN, FNP-C [facility] [place] Office: [phone]  Clinic Physician: Early  03/06/2014, 9:41 AM

## 2014-03-07 ENCOUNTER — Other Ambulatory Visit: Payer: Self-pay | Admitting: Gastroenterology

## 2014-03-08 NOTE — Addendum Note (Signed)
Addended by: Adria DillELDRIDGE-LEWIS, Jobany Montellano L on: 03/08/2014 10:56 AM   Modules accepted: Orders

## 2014-03-09 ENCOUNTER — Telehealth: Payer: Self-pay | Admitting: Family Medicine

## 2014-03-09 NOTE — Telephone Encounter (Signed)
Patient's wife notified of next protime appt for 03/19/2014 at 3:10pm. She states he is to restart warfarin tonight (recent colonoscopy.)

## 2014-03-12 ENCOUNTER — Ambulatory Visit: Payer: Self-pay

## 2014-03-19 ENCOUNTER — Encounter: Payer: Self-pay | Admitting: Family

## 2014-03-19 ENCOUNTER — Ambulatory Visit
Admission: RE | Admit: 2014-03-19 | Discharge: 2014-03-19 | Disposition: A | Discharge status: Home / Self Care | Payer: Medicare Other | Source: Ambulatory Visit | Attending: Family | Admitting: Family

## 2014-03-19 DIAGNOSIS — I714 Abdominal aortic aneurysm, without rupture, unspecified: Secondary | ICD-10-CM

## 2014-03-19 MED ORDER — IOHEXOL 350 MG/ML SOLN
60.0000 mL | Freq: Once | INTRAVENOUS | Status: AC | PRN
  Administered 2014-03-19: 60 mL via INTRAVENOUS

## 2014-03-20 ENCOUNTER — Ambulatory Visit: Payer: Medicare Other | Admitting: Family

## 2014-03-21 ENCOUNTER — Telehealth: Payer: Self-pay | Admitting: Pharmacist

## 2014-03-21 ENCOUNTER — Telehealth: Payer: Self-pay

## 2014-03-21 NOTE — Telephone Encounter (Signed)
Called pt's daughter to follow-up with CTA results.  Left voice mail re: nurse practitioners recommendations; "AAA stable @ 4.4 cm., and to follow-up in 1 year with an ultrasound of AAA and office exam.  Advised to call office if further questions.

## 2014-03-21 NOTE — Telephone Encounter (Signed)
Called to make recheck INR appt.  Patient's wife states that she is unable to bring him in by herself.  She will check with her children and see when one of them can help her bring patient in for INR check.

## 2014-03-21 NOTE — Telephone Encounter (Signed)
Message copied by Phillips OdorPULLINS, Shakeem Stern S on Wed Mar 21, 2014  9:06 AM ------      Message from: Annye RuskNICKEL, SUZANNE L      Created: Tue Mar 20, 2014  6:52 PM      Regarding: follow up, patient declined 03/20/14 visit post CTA abd/pelvis       [name],            Please notify patient that the CTA shows that his abdominal aortic aneurysm is stable at 4.4 cm.      It is recommended that he follow up in a year with an ultrasound of his abdominal aortic aneurysm and see me to discuss results the same day.            Thank you,      Rosalita ChessmanSuzanne  ------

## 2014-03-23 ENCOUNTER — Ambulatory Visit (INDEPENDENT_AMBULATORY_CARE_PROVIDER_SITE_OTHER): Payer: Medicare Other | Admitting: Pharmacist

## 2014-03-23 DIAGNOSIS — I82402 Acute embolism and thrombosis of unspecified deep veins of left lower extremity: Secondary | ICD-10-CM

## 2014-03-23 DIAGNOSIS — Z23 Encounter for immunization: Secondary | ICD-10-CM

## 2014-03-23 DIAGNOSIS — L97919 Non-pressure chronic ulcer of unspecified part of right lower leg with unspecified severity: Secondary | ICD-10-CM | POA: Diagnosis not present

## 2014-03-23 DIAGNOSIS — I87333 Chronic venous hypertension (idiopathic) with ulcer and inflammation of bilateral lower extremity: Secondary | ICD-10-CM | POA: Diagnosis present

## 2014-03-23 DIAGNOSIS — Z86718 Personal history of other venous thrombosis and embolism: Secondary | ICD-10-CM

## 2014-03-23 DIAGNOSIS — L97329 Non-pressure chronic ulcer of left ankle with unspecified severity: Secondary | ICD-10-CM | POA: Diagnosis not present

## 2014-03-23 LAB — POCT INR: INR: 2.3

## 2014-03-23 NOTE — Patient Instructions (Signed)
Anticoagulation Dose Instructions as of 03/23/2014     Joel SmilesSun Mon Tue Wed Thu Fri Sat   New Dose 7.5 mg 3.75 mg 7.5 mg 3.75 mg 7.5 mg 3.75 mg 7.5 mg    Description       Continue current warfarin dose of 1/2 tablet of 7.5mg  on mondays, wednesdays and fridays and 1 tablet all other day.        INR was 2.3 today

## 2014-03-30 ENCOUNTER — Telehealth: Payer: Self-pay | Admitting: Family Medicine

## 2014-03-30 DIAGNOSIS — L97329 Non-pressure chronic ulcer of left ankle with unspecified severity: Secondary | ICD-10-CM | POA: Diagnosis not present

## 2014-03-30 DIAGNOSIS — L97919 Non-pressure chronic ulcer of unspecified part of right lower leg with unspecified severity: Secondary | ICD-10-CM | POA: Diagnosis not present

## 2014-03-30 DIAGNOSIS — I87333 Chronic venous hypertension (idiopathic) with ulcer and inflammation of bilateral lower extremity: Secondary | ICD-10-CM | POA: Diagnosis not present

## 2014-03-31 NOTE — Telephone Encounter (Signed)
[facility]/2014 [facility]

## 2014-04-06 ENCOUNTER — Encounter (HOSPITAL_BASED_OUTPATIENT_CLINIC_OR_DEPARTMENT_OTHER): Payer: Medicare Other | Attending: General Surgery

## 2014-04-06 DIAGNOSIS — L97929 Non-pressure chronic ulcer of unspecified part of left lower leg with unspecified severity: Secondary | ICD-10-CM | POA: Insufficient documentation

## 2014-04-06 DIAGNOSIS — L97919 Non-pressure chronic ulcer of unspecified part of right lower leg with unspecified severity: Secondary | ICD-10-CM | POA: Diagnosis not present

## 2014-04-06 DIAGNOSIS — I87333 Chronic venous hypertension (idiopathic) with ulcer and inflammation of bilateral lower extremity: Secondary | ICD-10-CM | POA: Diagnosis not present

## 2014-04-06 DIAGNOSIS — L97329 Non-pressure chronic ulcer of left ankle with unspecified severity: Secondary | ICD-10-CM | POA: Diagnosis not present

## 2014-04-13 DIAGNOSIS — L97929 Non-pressure chronic ulcer of unspecified part of left lower leg with unspecified severity: Secondary | ICD-10-CM | POA: Diagnosis not present

## 2014-04-13 DIAGNOSIS — L97919 Non-pressure chronic ulcer of unspecified part of right lower leg with unspecified severity: Secondary | ICD-10-CM | POA: Diagnosis not present

## 2014-04-13 DIAGNOSIS — I87333 Chronic venous hypertension (idiopathic) with ulcer and inflammation of bilateral lower extremity: Secondary | ICD-10-CM | POA: Diagnosis not present

## 2014-04-13 DIAGNOSIS — L97329 Non-pressure chronic ulcer of left ankle with unspecified severity: Secondary | ICD-10-CM | POA: Diagnosis not present

## 2014-04-18 ENCOUNTER — Encounter: Payer: Self-pay | Admitting: Family Medicine

## 2014-04-18 ENCOUNTER — Ambulatory Visit (INDEPENDENT_AMBULATORY_CARE_PROVIDER_SITE_OTHER): Payer: Medicare Other | Admitting: Family Medicine

## 2014-04-18 VITALS — BP 128/65 | HR 67 | Temp 98.2°F

## 2014-04-18 DIAGNOSIS — Z7901 Long term (current) use of anticoagulants: Secondary | ICD-10-CM

## 2014-04-18 DIAGNOSIS — J449 Chronic obstructive pulmonary disease, unspecified: Secondary | ICD-10-CM

## 2014-04-18 DIAGNOSIS — I82402 Acute embolism and thrombosis of unspecified deep veins of left lower extremity: Secondary | ICD-10-CM

## 2014-04-18 DIAGNOSIS — Z86718 Personal history of other venous thrombosis and embolism: Secondary | ICD-10-CM

## 2014-04-18 DIAGNOSIS — F0391 Unspecified dementia with behavioral disturbance: Secondary | ICD-10-CM

## 2014-04-18 DIAGNOSIS — Z5181 Encounter for therapeutic drug level monitoring: Secondary | ICD-10-CM

## 2014-04-18 LAB — POCT INR: INR: 2.5

## 2014-04-18 MED ORDER — DILTIAZEM HCL 120 MG PO TABS: 120.0000 mg | ORAL_TABLET | Freq: Every day | ORAL | Status: DC

## 2014-04-18 NOTE — Progress Notes (Signed)
   Subjective:he does mention    Patient ID: [name], male    DOB: 1931/10/21, 80 y.o.   MRN: [id number]  HPI 80 year old gentleman who is here to follow-up on his pro time also did discuss medication for atrial fib. In the hospital he was placed on diltiazem and family wonders if he should still take the medicine. Apparently he has paroxysmal atrial fibrillation. He is on Coumadin that is for DVT  He does mention occasional rectal bleeding from internal hemorrhoid and occasional diarrhea.   Review of Systems  Gastrointestinal: Positive for blood in stool and rectal pain.  Psychiatric/Behavioral: Positive for confusion.       Objective:   Physical Exam  Constitutional:  Extremely large man at 6 feet 7 inches tall and 325 pounds  HENT:  Head: Normocephalic.  Neck: Normal range of motion.  Cardiovascular: Normal rate and regular rhythm.   Pulmonary/Chest: Effort normal.  Abdominal: Soft.  Psychiatric:  He does have some issues with memory. Thoughts tend to be fleeting    BP 128/65 mmHg  Pulse 67  Temp(Src) 98.2 F (36.8 C) (Oral)  Ht   Wt       Assessment & Plan:  1. Left leg DVT Pro time is therapeutic continue with same dose - POCT INR  2. Chronic obstructive pulmonary disease, unspecified COPD, unspecified chronic bronchitis type   3. Dementia, with behavioral disturbance Tried bedtime Ativan once but continues to get up at night which is hard on his wife  4. Anticoagulat  [name] MDion goal of INR 2 to 3

## 2014-04-19 DIAGNOSIS — I87333 Chronic venous hypertension (idiopathic) with ulcer and inflammation of bilateral lower extremity: Secondary | ICD-10-CM | POA: Diagnosis not present

## 2014-04-19 DIAGNOSIS — L97919 Non-pressure chronic ulcer of unspecified part of right lower leg with unspecified severity: Secondary | ICD-10-CM | POA: Diagnosis not present

## 2014-04-19 DIAGNOSIS — L97329 Non-pressure chronic ulcer of left ankle with unspecified severity: Secondary | ICD-10-CM | POA: Diagnosis not present

## 2014-04-19 DIAGNOSIS — L97929 Non-pressure chronic ulcer of unspecified part of left lower leg with unspecified severity: Secondary | ICD-10-CM | POA: Diagnosis not present

## 2014-04-30 ENCOUNTER — Telehealth: Payer: Self-pay | Admitting: *Deleted

## 2014-04-30 ENCOUNTER — Other Ambulatory Visit: Payer: Self-pay

## 2014-04-30 MED ORDER — WARFARIN SODIUM 7.5 MG PO TABS: 3.7500 mg | ORAL_TABLET | Freq: Every day | ORAL | Status: DC

## 2014-04-30 NOTE — Telephone Encounter (Signed)
Script for coumadin 7.5 qty 30 , 2 refills,  Take one tablet sun,tues,thurs,sat. And 1/2 other days.[facility]

## 2014-05-02 ENCOUNTER — Encounter (HOSPITAL_BASED_OUTPATIENT_CLINIC_OR_DEPARTMENT_OTHER): Payer: Medicare Other | Attending: Internal Medicine

## 2014-05-02 DIAGNOSIS — I87333 Chronic venous hypertension (idiopathic) with ulcer and inflammation of bilateral lower extremity: Secondary | ICD-10-CM | POA: Insufficient documentation

## 2014-05-02 DIAGNOSIS — Z86718 Personal history of other venous thrombosis and embolism: Secondary | ICD-10-CM | POA: Diagnosis not present

## 2014-05-02 DIAGNOSIS — L97321 Non-pressure chronic ulcer of left ankle limited to breakdown of skin: Secondary | ICD-10-CM | POA: Insufficient documentation

## 2014-05-02 DIAGNOSIS — L97929 Non-pressure chronic ulcer of unspecified part of left lower leg with unspecified severity: Secondary | ICD-10-CM | POA: Insufficient documentation

## 2014-05-02 DIAGNOSIS — L97919 Non-pressure chronic ulcer of unspecified part of right lower leg with unspecified severity: Secondary | ICD-10-CM | POA: Insufficient documentation

## 2014-05-11 DIAGNOSIS — L97919 Non-pressure chronic ulcer of unspecified part of right lower leg with unspecified severity: Secondary | ICD-10-CM | POA: Diagnosis not present

## 2014-05-11 DIAGNOSIS — L97321 Non-pressure chronic ulcer of left ankle limited to breakdown of skin: Secondary | ICD-10-CM | POA: Diagnosis not present

## 2014-05-11 DIAGNOSIS — L97929 Non-pressure chronic ulcer of unspecified part of left lower leg with unspecified severity: Secondary | ICD-10-CM | POA: Diagnosis not present

## 2014-05-11 DIAGNOSIS — I87333 Chronic venous hypertension (idiopathic) with ulcer and inflammation of bilateral lower extremity: Secondary | ICD-10-CM | POA: Diagnosis not present

## 2014-05-18 DIAGNOSIS — I87333 Chronic venous hypertension (idiopathic) with ulcer and inflammation of bilateral lower extremity: Secondary | ICD-10-CM | POA: Diagnosis not present

## 2014-05-18 DIAGNOSIS — L97321 Non-pressure chronic ulcer of left ankle limited to breakdown of skin: Secondary | ICD-10-CM | POA: Diagnosis not present

## 2014-05-18 DIAGNOSIS — L97929 Non-pressure chronic ulcer of unspecified part of left lower leg with unspecified severity: Secondary | ICD-10-CM | POA: Diagnosis not present

## 2014-05-18 DIAGNOSIS — L97919 Non-pressure chronic ulcer of unspecified part of right lower leg with unspecified severity: Secondary | ICD-10-CM | POA: Diagnosis not present

## 2014-05-21 ENCOUNTER — Ambulatory Visit (INDEPENDENT_AMBULATORY_CARE_PROVIDER_SITE_OTHER): Payer: Medicare Other | Admitting: Pharmacist

## 2014-05-21 DIAGNOSIS — Z86718 Personal history of other venous thrombosis and embolism: Secondary | ICD-10-CM

## 2014-05-21 DIAGNOSIS — I82402 Acute embolism and thrombosis of unspecified deep veins of left lower extremity: Secondary | ICD-10-CM

## 2014-05-21 LAB — POCT INR: INR: 2.5

## 2014-05-21 NOTE — Addendum Note (Signed)
Addended by: Henrene PastorECKARD, Raylan Hanton on: 05/21/2014 02:59 PM   Modules accepted: Level of Service

## 2014-05-21 NOTE — Patient Instructions (Signed)
Anticoagulation Dose Instructions as of 05/21/2014      [name]   New Dose 7.5 mg 3.75 mg 7.5 mg 3.75 mg 7.5 mg 3.75 mg 7.5 mg    Description        Continue current warfarin dose of 1/2 tablet of 7.5mg  on mondays, wednesdays and fridays and 1 tablet all other day.       INR was 2.5 today

## 2014-05-28 DIAGNOSIS — L97929 Non-pressure chronic ulcer of unspecified part of left lower leg with unspecified severity: Secondary | ICD-10-CM | POA: Diagnosis not present

## 2014-05-28 DIAGNOSIS — L97321 Non-pressure chronic ulcer of left ankle limited to breakdown of skin: Secondary | ICD-10-CM | POA: Diagnosis not present

## 2014-05-28 DIAGNOSIS — I87333 Chronic venous hypertension (idiopathic) with ulcer and inflammation of bilateral lower extremity: Secondary | ICD-10-CM | POA: Diagnosis not present

## 2014-05-28 DIAGNOSIS — L97919 Non-pressure chronic ulcer of unspecified part of right lower leg with unspecified severity: Secondary | ICD-10-CM | POA: Diagnosis not present

## 2014-06-07 ENCOUNTER — Encounter (HOSPITAL_BASED_OUTPATIENT_CLINIC_OR_DEPARTMENT_OTHER): Payer: Medicare Other | Attending: Internal Medicine

## 2014-06-07 DIAGNOSIS — L97819 Non-pressure chronic ulcer of other part of right lower leg with unspecified severity: Secondary | ICD-10-CM | POA: Insufficient documentation

## 2014-06-07 DIAGNOSIS — I87333 Chronic venous hypertension (idiopathic) with ulcer and inflammation of bilateral lower extremity: Secondary | ICD-10-CM | POA: Diagnosis not present

## 2014-06-07 DIAGNOSIS — L97829 Non-pressure chronic ulcer of other part of left lower leg with unspecified severity: Secondary | ICD-10-CM | POA: Diagnosis not present

## 2014-06-07 DIAGNOSIS — L97321 Non-pressure chronic ulcer of left ankle limited to breakdown of skin: Secondary | ICD-10-CM | POA: Diagnosis not present

## 2014-06-10 DIAGNOSIS — I82409 Acute embolism and thrombosis of unspecified deep veins of unspecified lower extremity: Secondary | ICD-10-CM | POA: Diagnosis not present

## 2014-06-12 DIAGNOSIS — I82409 Acute embolism and thrombosis of unspecified deep veins of unspecified lower extremity: Secondary | ICD-10-CM | POA: Diagnosis not present

## 2014-06-15 DIAGNOSIS — L97829 Non-pressure chronic ulcer of other part of left lower leg with unspecified severity: Secondary | ICD-10-CM | POA: Diagnosis not present

## 2014-06-15 DIAGNOSIS — L97321 Non-pressure chronic ulcer of left ankle limited to breakdown of skin: Secondary | ICD-10-CM | POA: Diagnosis not present

## 2014-06-15 DIAGNOSIS — I87333 Chronic venous hypertension (idiopathic) with ulcer and inflammation of bilateral lower extremity: Secondary | ICD-10-CM | POA: Diagnosis not present

## 2014-06-15 DIAGNOSIS — L97819 Non-pressure chronic ulcer of other part of right lower leg with unspecified severity: Secondary | ICD-10-CM | POA: Diagnosis not present

## 2014-06-25 DIAGNOSIS — I10 Essential (primary) hypertension: Secondary | ICD-10-CM | POA: Diagnosis not present

## 2014-06-25 DIAGNOSIS — S91002D Unspecified open wound, left ankle, subsequent encounter: Secondary | ICD-10-CM | POA: Diagnosis not present

## 2014-06-25 DIAGNOSIS — Z4800 Encounter for change or removal of nonsurgical wound dressing: Secondary | ICD-10-CM | POA: Diagnosis not present

## 2014-06-25 DIAGNOSIS — Z5181 Encounter for therapeutic drug level monitoring: Secondary | ICD-10-CM | POA: Diagnosis not present

## 2014-06-25 DIAGNOSIS — S81801D Unspecified open wound, right lower leg, subsequent encounter: Secondary | ICD-10-CM | POA: Diagnosis not present

## 2014-06-26 ENCOUNTER — Other Ambulatory Visit (INDEPENDENT_AMBULATORY_CARE_PROVIDER_SITE_OTHER): Payer: Medicare Other

## 2014-06-26 DIAGNOSIS — R8290 Unspecified abnormal findings in urine: Secondary | ICD-10-CM | POA: Diagnosis not present

## 2014-06-26 DIAGNOSIS — Z5181 Encounter for therapeutic drug level monitoring: Secondary | ICD-10-CM | POA: Diagnosis not present

## 2014-06-26 DIAGNOSIS — Z4800 Encounter for change or removal of nonsurgical wound dressing: Secondary | ICD-10-CM | POA: Diagnosis not present

## 2014-06-26 DIAGNOSIS — I10 Essential (primary) hypertension: Secondary | ICD-10-CM | POA: Diagnosis not present

## 2014-06-26 DIAGNOSIS — R8299 Other abnormal findings in urine: Secondary | ICD-10-CM | POA: Diagnosis not present

## 2014-06-26 DIAGNOSIS — S81801D Unspecified open wound, right lower leg, subsequent encounter: Secondary | ICD-10-CM | POA: Diagnosis not present

## 2014-06-26 DIAGNOSIS — S91002D Unspecified open wound, left ankle, subsequent encounter: Secondary | ICD-10-CM | POA: Diagnosis not present

## 2014-06-26 LAB — POCT URINALYSIS DIPSTICK
Bilirubin, UA: NEGATIVE
GLUCOSE UA: NEGATIVE
Ketones, UA: NEGATIVE
Nitrite, UA: NEGATIVE
Protein, UA: NEGATIVE
Urobilinogen, UA: NEGATIVE
pH, UA: 8

## 2014-06-26 LAB — POCT UA - MICROSCOPIC ONLY
CRYSTALS, UR, HPF, POC: POSITIVE
Casts, Ur, LPF, POC: NEGATIVE
MUCUS UA: NEGATIVE
Yeast, UA: NEGATIVE

## 2014-06-26 MED ORDER — CIPROFLOXACIN HCL 500 MG PO TABS: 500.0000 mg | ORAL_TABLET | Freq: Two times a day (BID) | ORAL | Status: DC

## 2014-06-26 NOTE — Progress Notes (Signed)
[facility] at [facility] and wife aware  cipro at pharm and protime to be checked by home health with in the next 48 hrs.

## 2014-06-26 NOTE — Addendum Note (Signed)
Addended by: Magdalene RiverBULLINS, Carli Lefevers H on: 06/26/2014 05:24 PM   Modules accepted: Orders

## 2014-06-27 DIAGNOSIS — S81801D Unspecified open wound, right lower leg, subsequent encounter: Secondary | ICD-10-CM | POA: Diagnosis not present

## 2014-06-27 DIAGNOSIS — S91002D Unspecified open wound, left ankle, subsequent encounter: Secondary | ICD-10-CM | POA: Diagnosis not present

## 2014-06-27 DIAGNOSIS — Z4800 Encounter for change or removal of nonsurgical wound dressing: Secondary | ICD-10-CM | POA: Diagnosis not present

## 2014-06-27 DIAGNOSIS — I10 Essential (primary) hypertension: Secondary | ICD-10-CM | POA: Diagnosis not present

## 2014-06-27 DIAGNOSIS — Z5181 Encounter for therapeutic drug level monitoring: Secondary | ICD-10-CM | POA: Diagnosis not present

## 2014-06-28 ENCOUNTER — Telehealth: Payer: Self-pay | Admitting: *Deleted

## 2014-06-28 ENCOUNTER — Ambulatory Visit (INDEPENDENT_AMBULATORY_CARE_PROVIDER_SITE_OTHER): Payer: Medicare Other | Admitting: Pharmacist

## 2014-06-28 DIAGNOSIS — Z4800 Encounter for change or removal of nonsurgical wound dressing: Secondary | ICD-10-CM | POA: Diagnosis not present

## 2014-06-28 DIAGNOSIS — Z5181 Encounter for therapeutic drug level monitoring: Secondary | ICD-10-CM | POA: Diagnosis not present

## 2014-06-28 DIAGNOSIS — Z86718 Personal history of other venous thrombosis and embolism: Secondary | ICD-10-CM

## 2014-06-28 DIAGNOSIS — I10 Essential (primary) hypertension: Secondary | ICD-10-CM | POA: Diagnosis not present

## 2014-06-28 DIAGNOSIS — S91002D Unspecified open wound, left ankle, subsequent encounter: Secondary | ICD-10-CM | POA: Diagnosis not present

## 2014-06-28 DIAGNOSIS — S81801D Unspecified open wound, right lower leg, subsequent encounter: Secondary | ICD-10-CM | POA: Diagnosis not present

## 2014-06-28 LAB — POCT INR: INR: 2

## 2014-06-28 NOTE — Telephone Encounter (Signed)
Protime 29.1  INR 2.0 Started Cipro for 10 days on 03/28/15. Takes 7.5 mg on tues, thurs, sat, sun. Takes 3.75 mg on Mon, Wed, Fri.

## 2014-06-28 NOTE — Telephone Encounter (Signed)
Therapeutic anticoagulation but since on cipro recommend recheck INR next week. Continue current warfarin dose.  [facility].

## 2014-06-28 NOTE — Progress Notes (Signed)
Subjective:     Indication: DVT Bleeding signs/symptoms: None Thromboembolic signs/symptoms: None  Missed Coumadin doses: None Medication changes: yes - started cipro for UTI yesterday Dietary changes: no Bacterial/viral infection: yes -   Other concerns: no  The following portions of the patient's history were reviewed and updated as appropriate: allergies, current medications, past family history, past medical history, past social history, past surgical history and problem list.    Objective:    INR Today: 2.0 Current warfarin dose if 7.5mg  1 tablet daily except 1/2 tablet on mondays, wednesdays and fridays.  Assessment:    Therapeutic INR for goal of 2-3   Plan:    1. New dose: no change   2. Next INR: 1 week

## 2014-06-29 DIAGNOSIS — I87333 Chronic venous hypertension (idiopathic) with ulcer and inflammation of bilateral lower extremity: Secondary | ICD-10-CM | POA: Diagnosis not present

## 2014-06-29 DIAGNOSIS — L97819 Non-pressure chronic ulcer of other part of right lower leg with unspecified severity: Secondary | ICD-10-CM | POA: Diagnosis not present

## 2014-06-29 DIAGNOSIS — L97321 Non-pressure chronic ulcer of left ankle limited to breakdown of skin: Secondary | ICD-10-CM | POA: Diagnosis not present

## 2014-06-29 DIAGNOSIS — Z5181 Encounter for therapeutic drug level monitoring: Secondary | ICD-10-CM | POA: Diagnosis not present

## 2014-06-29 DIAGNOSIS — Z4800 Encounter for change or removal of nonsurgical wound dressing: Secondary | ICD-10-CM | POA: Diagnosis not present

## 2014-06-29 DIAGNOSIS — S91002D Unspecified open wound, left ankle, subsequent encounter: Secondary | ICD-10-CM | POA: Diagnosis not present

## 2014-06-29 DIAGNOSIS — S81801D Unspecified open wound, right lower leg, subsequent encounter: Secondary | ICD-10-CM | POA: Diagnosis not present

## 2014-06-29 DIAGNOSIS — L97829 Non-pressure chronic ulcer of other part of left lower leg with unspecified severity: Secondary | ICD-10-CM | POA: Diagnosis not present

## 2014-06-29 DIAGNOSIS — I10 Essential (primary) hypertension: Secondary | ICD-10-CM | POA: Diagnosis not present

## 2014-07-01 LAB — URINE CULTURE

## 2014-07-02 DIAGNOSIS — S81801D Unspecified open wound, right lower leg, subsequent encounter: Secondary | ICD-10-CM | POA: Diagnosis not present

## 2014-07-02 DIAGNOSIS — I10 Essential (primary) hypertension: Secondary | ICD-10-CM | POA: Diagnosis not present

## 2014-07-02 DIAGNOSIS — Z4800 Encounter for change or removal of nonsurgical wound dressing: Secondary | ICD-10-CM | POA: Diagnosis not present

## 2014-07-02 DIAGNOSIS — S91002D Unspecified open wound, left ankle, subsequent encounter: Secondary | ICD-10-CM | POA: Diagnosis not present

## 2014-07-02 DIAGNOSIS — Z5181 Encounter for therapeutic drug level monitoring: Secondary | ICD-10-CM | POA: Diagnosis not present

## 2014-07-02 MED ORDER — AMPICILLIN 500 MG PO CAPS: 500.0000 mg | ORAL_CAPSULE | Freq: Four times a day (QID) | ORAL | Status: DC

## 2014-07-02 NOTE — Addendum Note (Signed)
Addended by: Magdalene RiverBULLINS, Willella Harding H on: 07/02/2014 04:59 PM   Modules accepted: Orders

## 2014-07-03 DIAGNOSIS — I10 Essential (primary) hypertension: Secondary | ICD-10-CM | POA: Diagnosis not present

## 2014-07-03 DIAGNOSIS — S91002D Unspecified open wound, left ankle, subsequent encounter: Secondary | ICD-10-CM | POA: Diagnosis not present

## 2014-07-03 DIAGNOSIS — Z4800 Encounter for change or removal of nonsurgical wound dressing: Secondary | ICD-10-CM | POA: Diagnosis not present

## 2014-07-03 DIAGNOSIS — S81801D Unspecified open wound, right lower leg, subsequent encounter: Secondary | ICD-10-CM | POA: Diagnosis not present

## 2014-07-03 DIAGNOSIS — Z5181 Encounter for therapeutic drug level monitoring: Secondary | ICD-10-CM | POA: Diagnosis not present

## 2014-07-05 ENCOUNTER — Ambulatory Visit (INDEPENDENT_AMBULATORY_CARE_PROVIDER_SITE_OTHER): Payer: Medicare Other | Admitting: Pharmacist

## 2014-07-05 ENCOUNTER — Telehealth: Payer: Self-pay | Admitting: *Deleted

## 2014-07-05 DIAGNOSIS — Z86718 Personal history of other venous thrombosis and embolism: Secondary | ICD-10-CM

## 2014-07-05 DIAGNOSIS — Z4800 Encounter for change or removal of nonsurgical wound dressing: Secondary | ICD-10-CM | POA: Diagnosis not present

## 2014-07-05 DIAGNOSIS — S91002D Unspecified open wound, left ankle, subsequent encounter: Secondary | ICD-10-CM | POA: Diagnosis not present

## 2014-07-05 DIAGNOSIS — S81801D Unspecified open wound, right lower leg, subsequent encounter: Secondary | ICD-10-CM | POA: Diagnosis not present

## 2014-07-05 DIAGNOSIS — Z5181 Encounter for therapeutic drug level monitoring: Secondary | ICD-10-CM | POA: Diagnosis not present

## 2014-07-05 DIAGNOSIS — I10 Essential (primary) hypertension: Secondary | ICD-10-CM | POA: Diagnosis not present

## 2014-07-05 LAB — POCT INR: INR: 1.8

## 2014-07-05 NOTE — Telephone Encounter (Signed)
Protime 22.2  INR 1.8 Last dose of Cipro is tomorrow, but he will still be on Ampicillin for 6 more days

## 2014-07-05 NOTE — Telephone Encounter (Signed)
subtherapeutic anticoagulation with current ABX therapy.  Take 1 and 1/2 tablet of warfarin 7.5mg  today, then resume usual dose of 1/2 tablet mondays, wednesdays and fridays and 1 tablet all other days. Recheck in 1 week . [facility] notified.

## 2014-07-06 ENCOUNTER — Encounter (HOSPITAL_BASED_OUTPATIENT_CLINIC_OR_DEPARTMENT_OTHER): Payer: Medicare Other | Attending: Internal Medicine

## 2014-07-06 DIAGNOSIS — I87313 Chronic venous hypertension (idiopathic) with ulcer of bilateral lower extremity: Secondary | ICD-10-CM | POA: Insufficient documentation

## 2014-07-06 DIAGNOSIS — L97919 Non-pressure chronic ulcer of unspecified part of right lower leg with unspecified severity: Secondary | ICD-10-CM | POA: Insufficient documentation

## 2014-07-06 DIAGNOSIS — L97321 Non-pressure chronic ulcer of left ankle limited to breakdown of skin: Secondary | ICD-10-CM | POA: Diagnosis not present

## 2014-07-10 DIAGNOSIS — I10 Essential (primary) hypertension: Secondary | ICD-10-CM | POA: Diagnosis not present

## 2014-07-10 DIAGNOSIS — S81801D Unspecified open wound, right lower leg, subsequent encounter: Secondary | ICD-10-CM | POA: Diagnosis not present

## 2014-07-10 DIAGNOSIS — Z4800 Encounter for change or removal of nonsurgical wound dressing: Secondary | ICD-10-CM | POA: Diagnosis not present

## 2014-07-10 DIAGNOSIS — S91002D Unspecified open wound, left ankle, subsequent encounter: Secondary | ICD-10-CM | POA: Diagnosis not present

## 2014-07-10 DIAGNOSIS — Z5181 Encounter for therapeutic drug level monitoring: Secondary | ICD-10-CM | POA: Diagnosis not present

## 2014-07-11 DIAGNOSIS — I82409 Acute embolism and thrombosis of unspecified deep veins of unspecified lower extremity: Secondary | ICD-10-CM | POA: Diagnosis not present

## 2014-07-12 ENCOUNTER — Ambulatory Visit: Payer: Self-pay | Admitting: Pharmacist

## 2014-07-12 ENCOUNTER — Telehealth: Payer: Self-pay | Admitting: *Deleted

## 2014-07-12 DIAGNOSIS — Z5181 Encounter for therapeutic drug level monitoring: Secondary | ICD-10-CM | POA: Diagnosis not present

## 2014-07-12 DIAGNOSIS — S91002D Unspecified open wound, left ankle, subsequent encounter: Secondary | ICD-10-CM | POA: Diagnosis not present

## 2014-07-12 DIAGNOSIS — S81801D Unspecified open wound, right lower leg, subsequent encounter: Secondary | ICD-10-CM | POA: Diagnosis not present

## 2014-07-12 DIAGNOSIS — I10 Essential (primary) hypertension: Secondary | ICD-10-CM | POA: Diagnosis not present

## 2014-07-12 DIAGNOSIS — Z86718 Personal history of other venous thrombosis and embolism: Secondary | ICD-10-CM

## 2014-07-12 DIAGNOSIS — Z4800 Encounter for change or removal of nonsurgical wound dressing: Secondary | ICD-10-CM | POA: Diagnosis not present

## 2014-07-12 LAB — POCT INR: INR: 2.5

## 2014-07-12 NOTE — Telephone Encounter (Signed)
INR therapeutic Continue warfarin 7.5mg  -  1/2 tablet mondays, wednesdays and fridays and 1 tablet all other days. Recheck in 1 week.  [facility] given .  Patient's wife also notified.

## 2014-07-12 NOTE — Telephone Encounter (Signed)
INR 2.5

## 2014-07-13 DIAGNOSIS — I82409 Acute embolism and thrombosis of unspecified deep veins of unspecified lower extremity: Secondary | ICD-10-CM | POA: Diagnosis not present

## 2014-07-13 DIAGNOSIS — I87313 Chronic venous hypertension (idiopathic) with ulcer of bilateral lower extremity: Secondary | ICD-10-CM | POA: Diagnosis not present

## 2014-07-13 DIAGNOSIS — L97321 Non-pressure chronic ulcer of left ankle limited to breakdown of skin: Secondary | ICD-10-CM | POA: Diagnosis not present

## 2014-07-13 DIAGNOSIS — L97919 Non-pressure chronic ulcer of unspecified part of right lower leg with unspecified severity: Secondary | ICD-10-CM | POA: Diagnosis not present

## 2014-07-18 ENCOUNTER — Telehealth: Payer: Self-pay | Admitting: *Deleted

## 2014-07-18 ENCOUNTER — Ambulatory Visit (INDEPENDENT_AMBULATORY_CARE_PROVIDER_SITE_OTHER): Payer: Medicare Other | Admitting: Pharmacist

## 2014-07-18 DIAGNOSIS — S91002D Unspecified open wound, left ankle, subsequent encounter: Secondary | ICD-10-CM | POA: Diagnosis not present

## 2014-07-18 DIAGNOSIS — Z86718 Personal history of other venous thrombosis and embolism: Secondary | ICD-10-CM

## 2014-07-18 DIAGNOSIS — I10 Essential (primary) hypertension: Secondary | ICD-10-CM | POA: Diagnosis not present

## 2014-07-18 DIAGNOSIS — Z4800 Encounter for change or removal of nonsurgical wound dressing: Secondary | ICD-10-CM | POA: Diagnosis not present

## 2014-07-18 DIAGNOSIS — Z5181 Encounter for therapeutic drug level monitoring: Secondary | ICD-10-CM | POA: Diagnosis not present

## 2014-07-18 DIAGNOSIS — S81801D Unspecified open wound, right lower leg, subsequent encounter: Secondary | ICD-10-CM | POA: Diagnosis not present

## 2014-07-18 LAB — POCT INR: INR: 2.2

## 2014-07-18 NOTE — Telephone Encounter (Signed)
Protime 26.8  INR 2.2

## 2014-07-18 NOTE — Telephone Encounter (Signed)
Therapeutic anticoagulation Continue warfarin 7.5mg  tablet 1/2 tablet MWF and 1 tablet all other days.  Patient 's [facility].

## 2014-07-19 ENCOUNTER — Inpatient Hospital Stay
Admission: RE | Admit: 2014-07-19 | Discharge: 2014-09-06 | Disposition: A | Discharge status: Home / Self Care | Payer: Medicare Other | Source: Ambulatory Visit | Attending: Internal Medicine | Admitting: Internal Medicine

## 2014-07-19 DIAGNOSIS — R488 Other symbolic dysfunctions: Secondary | ICD-10-CM | POA: Diagnosis not present

## 2014-07-19 DIAGNOSIS — I482 Chronic atrial fibrillation: Secondary | ICD-10-CM | POA: Diagnosis not present

## 2014-07-19 DIAGNOSIS — I82409 Acute embolism and thrombosis of unspecified deep veins of unspecified lower extremity: Secondary | ICD-10-CM | POA: Diagnosis not present

## 2014-07-19 DIAGNOSIS — J9811 Atelectasis: Secondary | ICD-10-CM | POA: Diagnosis not present

## 2014-07-19 DIAGNOSIS — R260 Ataxic gait: Secondary | ICD-10-CM | POA: Diagnosis not present

## 2014-07-19 DIAGNOSIS — T45515A Adverse effect of anticoagulants, initial encounter: Secondary | ICD-10-CM | POA: Diagnosis not present

## 2014-07-19 DIAGNOSIS — R635 Abnormal weight gain: Secondary | ICD-10-CM | POA: Diagnosis not present

## 2014-07-19 DIAGNOSIS — F0390 Unspecified dementia without behavioral disturbance: Secondary | ICD-10-CM | POA: Diagnosis not present

## 2014-07-19 DIAGNOSIS — Z7901 Long term (current) use of anticoagulants: Secondary | ICD-10-CM | POA: Diagnosis not present

## 2014-07-19 DIAGNOSIS — J449 Chronic obstructive pulmonary disease, unspecified: Secondary | ICD-10-CM | POA: Diagnosis not present

## 2014-07-19 DIAGNOSIS — I872 Venous insufficiency (chronic) (peripheral): Secondary | ICD-10-CM | POA: Diagnosis not present

## 2014-07-19 DIAGNOSIS — I82402 Acute embolism and thrombosis of unspecified deep veins of left lower extremity: Secondary | ICD-10-CM | POA: Diagnosis not present

## 2014-07-19 DIAGNOSIS — I739 Peripheral vascular disease, unspecified: Secondary | ICD-10-CM | POA: Diagnosis not present

## 2014-07-19 DIAGNOSIS — R269 Unspecified abnormalities of gait and mobility: Secondary | ICD-10-CM | POA: Diagnosis not present

## 2014-07-19 DIAGNOSIS — R0989 Other specified symptoms and signs involving the circulatory and respiratory systems: Principal | ICD-10-CM

## 2014-07-19 DIAGNOSIS — I714 Abdominal aortic aneurysm, without rupture: Secondary | ICD-10-CM | POA: Diagnosis not present

## 2014-07-19 DIAGNOSIS — I517 Cardiomegaly: Secondary | ICD-10-CM | POA: Diagnosis not present

## 2014-07-19 DIAGNOSIS — I83013 Varicose veins of right lower extremity with ulcer of ankle: Secondary | ICD-10-CM | POA: Diagnosis not present

## 2014-07-19 DIAGNOSIS — R278 Other lack of coordination: Secondary | ICD-10-CM | POA: Diagnosis not present

## 2014-07-19 DIAGNOSIS — J00 Acute nasopharyngitis [common cold]: Secondary | ICD-10-CM | POA: Diagnosis not present

## 2014-07-19 DIAGNOSIS — I509 Heart failure, unspecified: Secondary | ICD-10-CM | POA: Diagnosis not present

## 2014-07-19 DIAGNOSIS — D699 Hemorrhagic condition, unspecified: Secondary | ICD-10-CM | POA: Diagnosis not present

## 2014-07-19 DIAGNOSIS — I83012 Varicose veins of right lower extremity with ulcer of calf: Secondary | ICD-10-CM | POA: Diagnosis not present

## 2014-07-19 DIAGNOSIS — R05 Cough: Secondary | ICD-10-CM | POA: Diagnosis not present

## 2014-07-19 DIAGNOSIS — M6281 Muscle weakness (generalized): Secondary | ICD-10-CM | POA: Diagnosis not present

## 2014-07-19 DIAGNOSIS — I83011 Varicose veins of right lower extremity with ulcer of thigh: Secondary | ICD-10-CM | POA: Diagnosis not present

## 2014-07-19 DIAGNOSIS — R609 Edema, unspecified: Secondary | ICD-10-CM | POA: Diagnosis not present

## 2014-07-20 ENCOUNTER — Non-Acute Institutional Stay (SKILLED_NURSING_FACILITY): Payer: Medicare Other | Admitting: Internal Medicine

## 2014-07-20 ENCOUNTER — Other Ambulatory Visit: Payer: Self-pay

## 2014-07-20 ENCOUNTER — Other Ambulatory Visit (HOSPITAL_COMMUNITY)
Admission: AD | Admit: 2014-07-20 | Discharge: 2014-07-20 | Disposition: A | Discharge status: Home / Self Care | Payer: Medicare Other | Source: Skilled Nursing Facility | Attending: Internal Medicine | Admitting: Internal Medicine

## 2014-07-20 ENCOUNTER — Encounter: Payer: Self-pay | Admitting: Internal Medicine

## 2014-07-20 DIAGNOSIS — F0390 Unspecified dementia without behavioral disturbance: Secondary | ICD-10-CM

## 2014-07-20 DIAGNOSIS — I482 Chronic atrial fibrillation, unspecified: Secondary | ICD-10-CM

## 2014-07-20 DIAGNOSIS — I714 Abdominal aortic aneurysm, without rupture, unspecified: Secondary | ICD-10-CM

## 2014-07-20 DIAGNOSIS — I82402 Acute embolism and thrombosis of unspecified deep veins of left lower extremity: Secondary | ICD-10-CM

## 2014-07-20 DIAGNOSIS — I739 Peripheral vascular disease, unspecified: Secondary | ICD-10-CM | POA: Diagnosis not present

## 2014-07-20 LAB — BASIC METABOLIC PANEL
Anion gap: 5 (ref 5–15)
BUN: 19 mg/dL (ref 6–23)
CALCIUM: 8.8 mg/dL (ref 8.4–10.5)
CO2: 27 mmol/L (ref 19–32)
Chloride: 111 mmol/L (ref 96–112)
Creatinine, Ser: 0.98 mg/dL (ref 0.50–1.35)
GFR calc Af Amer: 88 mL/min — ABNORMAL LOW (ref >90)
GFR, EST NON AFRICAN AMERICAN: 76 mL/min — AB (ref >90)
Glucose, Bld: 101 mg/dL — ABNORMAL HIGH (ref 70–99)
POTASSIUM: 4.2 mmol/L (ref 3.5–5.1)
SODIUM: 143 mmol/L (ref 135–145)

## 2014-07-20 LAB — CBC WITH DIFFERENTIAL/PLATELET
Basophils Absolute: 0 10*3/uL (ref 0.0–0.1)
Basophils Relative: 1 % (ref 0–1)
EOS ABS: 0.4 10*3/uL (ref 0.0–0.7)
Eosinophils Relative: 6 % — ABNORMAL HIGH (ref 0–5)
HCT: 39 % (ref 39.0–52.0)
HEMOGLOBIN: 12.3 g/dL — AB (ref 13.0–17.0)
Lymphocytes Relative: 27 % (ref 12–46)
Lymphs Abs: 1.7 10*3/uL (ref 0.7–4.0)
MCH: 28.8 pg (ref 26.0–34.0)
MCHC: 31.5 g/dL (ref 30.0–36.0)
MCV: 91.3 fL (ref 78.0–100.0)
MONOS PCT: 9 % (ref 3–12)
Monocytes Absolute: 0.6 10*3/uL (ref 0.1–1.0)
Neutro Abs: 3.7 10*3/uL (ref 1.7–7.7)
Neutrophils Relative %: 59 % (ref 43–77)
PLATELETS: 187 10*3/uL (ref 150–400)
RBC: 4.27 MIL/uL (ref 4.22–5.81)
RDW: 15.8 % — ABNORMAL HIGH (ref 11.5–15.5)
WBC: 6.3 10*3/uL (ref 4.0–10.5)

## 2014-07-20 LAB — PROTIME-INR
INR: 2.05 — AB (ref 0.00–1.49)
Prothrombin Time: 23.3 seconds — ABNORMAL HIGH (ref 11.6–15.2)

## 2014-07-20 MED ORDER — TRAMADOL HCL 50 MG PO TABS: 50.0000 mg | ORAL_TABLET | Freq: Four times a day (QID) | ORAL | Status: DC | PRN

## 2014-07-20 NOTE — Progress Notes (Signed)
Patient ID: [name], male   DOB: August 05, 1931, 81 y.o.   MRN: [id number]   This is an acute visit.  Level care skilled.  Facility [name].  Chief complaint-acute visit secondary to nursing home admission with history of bilateral leg wounds-history of atrial fibrillation and DVT on chronic anticoagulation-history of dementia-COPD. CHF  History of present illness.  Patient is a pleasant 81 year old male who is here apparently for care of his wound see is followed at the wound clinic by [name] who will see him early next week.  Apparently there are some home issues which makes his care at home somewhat difficult currently-.  He does have a history of a left leg DVT as well on chronic Coumadin with a goal INR between 2 and 3-INR today is 2.05.  He also has a history of an abdominal aortic aneurysm this is followed by vascular per review of notes in Epic-back in October 2015 this was deemed to be stable at 4.4 cm.  He also has a history of COPD which appears to be stable currently he does not complain of shortness of breath or increased cough.  Also a history of dementia which is thought to be apparently fairly mild.  Regards to CHF he is on Lasix his weights will have to be monitored.--Her chart review he did have a 2-D echo in April 2015 which showed a left ventricular ejection fraction of 60-65 percent.  Currently tonight he has no complaints he is resting in bed comfortably vital signs appear to be stable has somewhat elevated systolic in the 160s this will have to be monitored he is on Cardizem as well as Cardura.   family medical social history is been reviewed most recently note from primary care provider on 04/18/2014.  His medical history includes.  CHF.  Hypertension.  Atrial fibrillation.  Left leg DVT.  Bilateral leg wounds.  Abdominal aortic aneurysm.  Dementia.  [name].    Medications have been reviewed per [name] they include.  Lasix 40 mg  daily.  Cardizem 120 mg daily.  Cardura 4 mg daily at bedtime.  Potassium 10 mEq daily.  Tramadol 50 mg every 6 hours when necessary pain.  Coumadin alternating doses of 7.5 and 5 mg.  Proscar 5 mg daily at bedtime.  Xalatan 0.005% drops daily at bedtime.  Review of systems.  In general does not complain of any fever or chills.  Skin has bilateral leg wounds which are currently covered with Ace wrap again this will be followed by wound care nurse and [name].  Eyes does not complain of any visual changes.  Ear nose mouth and throat does not complain of difficulty swallowing or sore throat.  Cardiac does not complain of chest pain has a history of A. fib.  Respiratory is not complaining of shortness of breath or cough.  Has a history of COPD.  GI does not complain of abdominal pain nausea vomiting diarrhea or constipation. GU does not complain of dysuria.  Muscle skeletal has extensive lower extremity weakness and edema but does not complain of joint pain currently.  Neurologic is not complaining of dizziness or headache or syncopal-type feelings.  Psych does have a history of dementia which apparently is mild.  Physical exam.  Temperature is 98.4 pulse 75 respirations 20 blood pressure 162/64 weight is 325.4.  In general this is an obese elderly male in no distress resting comfortably in bed.  His skin is warm and dry he does have a  history of bilateral leg wounds currently these are wrapped with Ace bandage.  He does have numerous solar induced changes on his arms and face most prominently.  Eyes pupils appear reactive to light sclerae and conjunctivae clear visual acuity appears grossly intact.  Oropharynx clear mucous membranes moist.  Chest is clear to auscultation with shallow air entry there is no labored breathing.  Heart is regular irregular rate and rhythm in the 60s on exam again he does appear to have some edema although difficult to fully assess  secondary to rapidly the legs.  Abdomen is obese soft nontender positive bowel sounds.  Musculoskeletal is able to move all extremities 4 with significant lower extremity weakness I do not note any deformities.--He requires assistance to stand  Neurologic is grossly intact his speech is clear I could not appreciate any lateralizing findings.  Psych he appears grossly alert and oriented pleasant and appropriate.  Labs.  07/14/2013.  Sodium 143 potassium 4.2 BUN 19 creatinine 0.98.  WBC 6.3 hemoglobin 12.3 platelets 187.  INR 2.05.  Assessment and plan.  #1-history of atrial fibrillation left leg DVT-this appears rate controlled currently he is on Cardizem-also on chronic anticoagulation INRs therapeutic this will need to be rechecked on Monday, February 22.  #2 history CHF weights will have to be monitored closely he is on Lasix with potassium recent metabolic panel appears stable-clinically this appears stable although his fragile in this regard.  #3 hypertension again systolic is somewhat elevated although we don't have many readings yet will continue to monitor he is on Cardizem and Cardura.  #4-history of [name] he continues on topical eyedrops.  #5 history of bilateral leg wounds with history of peripheral vascular disease   again as noted this will be followed by wound care here as well as Dr. Leanord Hawkingobson.  #6-history COPD at this point appears stable although will have to be monitored.  #7 suspected history BPH he is on Proscar.  #8-history of dementia-this appears to be fairly mild-currently not on any medication  #9-history of abdominal aorta  aneurysm- again this is followed by vascular most recent study indicated it was stable  Again my understanding is patient is here for follow-up of his leg wounds-apparently there also home care issues with his wife apparently unable to really fully take care of him this will certainly have to be addressed before he is discharged  back home-at this point he appears to be stable however  CPT-99 310-of note greater than 40 minutes spent assessing patient reviewing his medical history in chart including numerous previous progress notes-and coordinating and formulating a plan of care for numerous diagnoses-of note greater than 50% of time spent

## 2014-07-22 DIAGNOSIS — S91009A Unspecified open wound, unspecified ankle, initial encounter: Secondary | ICD-10-CM

## 2014-07-22 DIAGNOSIS — S81009A Unspecified open wound, unspecified knee, initial encounter: Secondary | ICD-10-CM | POA: Insufficient documentation

## 2014-07-22 DIAGNOSIS — S81809A Unspecified open wound, unspecified lower leg, initial encounter: Secondary | ICD-10-CM

## 2014-07-23 ENCOUNTER — Non-Acute Institutional Stay (SKILLED_NURSING_FACILITY): Payer: Medicare Other | Admitting: Internal Medicine

## 2014-07-23 ENCOUNTER — Encounter (HOSPITAL_COMMUNITY)
Admission: RE | Admit: 2014-07-23 | Discharge: 2014-07-23 | Disposition: A | Discharge status: Home / Self Care | Payer: Medicare Other | Source: Skilled Nursing Facility | Attending: Internal Medicine | Admitting: Internal Medicine

## 2014-07-23 DIAGNOSIS — I83015 Varicose veins of right lower extremity with ulcer other part of foot: Secondary | ICD-10-CM

## 2014-07-23 DIAGNOSIS — I83014 Varicose veins of right lower extremity with ulcer of heel and midfoot: Secondary | ICD-10-CM

## 2014-07-23 DIAGNOSIS — I83011 Varicose veins of right lower extremity with ulcer of thigh: Secondary | ICD-10-CM | POA: Diagnosis not present

## 2014-07-23 DIAGNOSIS — I83018 Varicose veins of right lower extremity with ulcer other part of lower leg: Secondary | ICD-10-CM

## 2014-07-23 DIAGNOSIS — I83013 Varicose veins of right lower extremity with ulcer of ankle: Secondary | ICD-10-CM

## 2014-07-23 DIAGNOSIS — I83019 Varicose veins of right lower extremity with ulcer of unspecified site: Secondary | ICD-10-CM

## 2014-07-23 DIAGNOSIS — R260 Ataxic gait: Secondary | ICD-10-CM

## 2014-07-23 DIAGNOSIS — I872 Venous insufficiency (chronic) (peripheral): Secondary | ICD-10-CM

## 2014-07-23 DIAGNOSIS — I83012 Varicose veins of right lower extremity with ulcer of calf: Secondary | ICD-10-CM

## 2014-07-23 DIAGNOSIS — L97919 Non-pressure chronic ulcer of unspecified part of right lower leg with unspecified severity: Secondary | ICD-10-CM

## 2014-07-23 LAB — PROTIME-INR
INR: 1.87 — ABNORMAL HIGH (ref 0.00–1.49)
Prothrombin Time: 21.7 seconds — ABNORMAL HIGH (ref 11.6–15.2)

## 2014-07-26 NOTE — Progress Notes (Signed)
Patient ID: [name], male   DOB: 26-Jun-1931, 81 y.o.   MRN: [id number]                 HISTORY & PHYSICAL  DATE:  07/23/2014                 FACILITY: [facility]       LEVEL OF CARE:   SNF   CHIEF COMPLAINT:  Admission from home to SNF, predominantly due to complete inability to walk.       HISTORY OF PRESENT ILLNESS:  This is a patient whom I followed at [facility].  He has predominantly venous wounds in his lower extremities.  His daughter usually brings him into the wound care center and had mentioned that his gait had progressively declined to the point that he was no longer able to ambulate.  He was last in the building in May through early June 2015.  He could walk with a walker for short distances, for example to the bathroom.   Apparently, he was no longer able to do that.  I was able to assist them in arranging admission, predominantly for rehabilitation.    When he was here in April/May, he had been in hospital for recurrent DVTs, new onset atrial fibrillation, neuropathy, and mild dementia.  He was noted to have pneumonia and congestive heart failure.  He had had cellulitis of his leg.  He was not the easiest person to rehabilitate, often being disagreeable with the suggestions of the therapist.  However, he seemed to do better.  Towards the end of his stay, he was able to walk with a walker to the bathroom, etc.  His wife says he went home and shortly thereafter stopped walking and became immobilized.   Because he was going to the wound care center, home health would not see him in the home (not home-bound).  He, therefore, has been doing sliding transfers, sometimes with a board, I think most of the time just slipping out of bed into the wheelchair and onto the commode chair.  He has had one fall.  There would seem to be a fair amount of incontinence, both bladder and bowel.  As mentioned, he is no longer ambulatory.    PAST MEDICAL HISTORY/PROBLEM  LIST:                       Atrial fibrillation.    COPD.    Severe bilateral osteoarthritis of the knees.    Chronic venous insufficiency with venous insufficiency wounds.    Urinary frequency and incontinence.    Some degree of cognitive impairment.    History of recurrent DVTs.    Peripheral vascular disease.     Glaucoma.    CURRENT MEDICATIONS:  Medication list is reviewed.                        Lasix 40 q.d.      Cardizem CD 120 q.d.       Cardura 8 mg,  tablet q.h.s.        K-Dur 10 mEq q.d.      Tramadol 50 q.6 p.r.n.      Warfarin 7.5 on Tuesdays, Thursdays, Saturdays, and Sundays, and  of a 7.5 on Mondays, Wednesdays, and Fridays.    Proscar 5 mg q.h.s.       Xalatan ophthalmic.      SOCIAL HISTORY:  HOUSING:  Lives in [place] with his wife.   FUNCTIONAL STATUS:  They have an in-home caregiver.   As mentioned, he is no longer ambulatory.  Has a wheelchair and a bedside commode.  He needs considerable assistance for ADLs and there is episodic bowel incontinence as well as, I believe, fairly frequent bladder incontinence.  He has only had one fall.      LABORATORY DATA:  Lab work to date shows an essentially normal basic metabolic panel, as well as CBC and differential.    His INR was 2.05 on 07/20/2014.  It is 1.87 today.    REVIEW OF SYSTEMS:            CHEST/RESPIRATORY:  He is not complaining of shortness of breath.     CARDIAC:  No chest pain.    GI:  As noted, episodic bowel incontinence.  No vomiting.  No abdominal pain.   GU:  Urinary urgency and incontinence.     PHYSICAL EXAMINATION:   VITAL SIGNS:     PULSE:  72.     RESPIRATIONS:  18, and unlabored.   02 SATURATIONS:  95% on room air.   GENERAL APPEARANCE:  The patient is not in any distress.    CHEST/RESPIRATORY:  Decreased air entry bilaterally, but no crackles or wheezes.  His work of breathing is normal.   CARDIOVASCULAR:   CARDIAC:  Heart sounds are irregular.   There are no murmurs.  No increase in jugular venous pressure.  He appears to be euvolemic.   GASTROINTESTINAL:   ABDOMEN:  Distended quite a bit, especially in the upper quadrants.   LIVER/SPLEEN/KIDNEY:   No liver, no spleen.  No signs of chronic liver disease.     GENITOURINARY:   BLADDER:  Not distended.  There is no costovertebral angle tenderness.     CIRCULATION:   EDEMA/VARICOSITIES:  Significant venous stasis.   VASCULAR:   ARTERIAL:  Peripheral pulses are reduced.    SKIN:   INSPECTION:  There is a remaining wound over the left lateral malleolus about the size of a dime, covered in a slight eschar.  This has been a recurrent issue.   MUSCULOSKELETAL:    EXTREMITIES:   BILATERAL LOWER EXTREMITIES:  He has significant osteoarthritis of both knees.  There are flexion contractures probably at hips and knees, which hinders gait.   NEUROLOGICAL:   DEEP TENDON REFLEXES:  He is diffusely hyporeflexic.  Toes are downgoing bilaterally.   SENSATION/STRENGTH:  He does have antigravity strength in the legs, especially proximally.    ASSESSMENT/PLAN:                                                 Gait ataxia and immobility.  I think this is multifactorial with significant osteoarthritis with flexion contractures, neuropathy.  I am really uncertain if anything can be done here.    Severe venous insufficiency with a wound on the left lateral malleolus.  Santyl, foam under Profore wraps.    Atrial fibrillation with a history of recurrent DVTs, I believe.  He is on Coumadin.  He is subtherapeutic.  I am going to change him to 7.5 on Tuesdays, Thursdays, Saturdays, and Sundays, and 5 mg on Mondays, Wednesdays, and Fridays.  Follow up later in the week.    COPD.  This seems stable.    CHF.  This seems stable.    The patient is predominantly here for an attempt at rehabilitation.  I am uncertain about whether this is going to be possible.     CPT CODE: 16109

## 2014-07-27 ENCOUNTER — Encounter (HOSPITAL_COMMUNITY)
Admission: RE | Admit: 2014-07-27 | Discharge: 2014-07-27 | Disposition: A | Discharge status: Home / Self Care | Payer: Medicare Other | Source: Skilled Nursing Facility | Attending: Internal Medicine | Admitting: Internal Medicine

## 2014-07-27 LAB — PROTIME-INR
INR: 1.61 — AB (ref 0.00–1.49)
Prothrombin Time: 19.3 seconds — ABNORMAL HIGH (ref 11.6–15.2)

## 2014-07-30 ENCOUNTER — Ambulatory Visit (INDEPENDENT_AMBULATORY_CARE_PROVIDER_SITE_OTHER): Payer: Medicare Other | Admitting: Pharmacist

## 2014-07-30 ENCOUNTER — Telehealth: Payer: Self-pay | Admitting: Family Medicine

## 2014-07-30 ENCOUNTER — Encounter (HOSPITAL_COMMUNITY)
Admission: RE | Admit: 2014-07-30 | Discharge: 2014-07-30 | Disposition: A | Discharge status: Home / Self Care | Payer: Medicare Other | Source: Skilled Nursing Facility | Attending: Internal Medicine | Admitting: Internal Medicine

## 2014-07-30 DIAGNOSIS — Z86718 Personal history of other venous thrombosis and embolism: Secondary | ICD-10-CM

## 2014-07-30 LAB — PROTIME-INR
INR: 2.34 — ABNORMAL HIGH (ref 0.00–1.49)
PROTHROMBIN TIME: 25.9 s — AB (ref 11.6–15.2)

## 2014-07-30 NOTE — Telephone Encounter (Signed)
Noted - will remove from anticoagulation clinic since they will check protime at [facility].

## 2014-08-03 ENCOUNTER — Other Ambulatory Visit (HOSPITAL_COMMUNITY)
Admission: AD | Admit: 2014-08-03 | Discharge: 2014-08-03 | Disposition: A | Discharge status: Home / Self Care | Payer: Medicare Other | Source: Skilled Nursing Facility | Attending: Internal Medicine | Admitting: Internal Medicine

## 2014-08-03 LAB — PROTIME-INR
INR: 3.36 — AB (ref 0.00–1.49)
Prothrombin Time: 34.3 seconds — ABNORMAL HIGH (ref 11.6–15.2)

## 2014-08-04 ENCOUNTER — Other Ambulatory Visit (HOSPITAL_COMMUNITY)
Admission: RE | Admit: 2014-08-04 | Discharge: 2014-08-04 | Disposition: A | Discharge status: Home / Self Care | Payer: Medicare Other | Source: Other Acute Inpatient Hospital | Attending: Internal Medicine | Admitting: Internal Medicine

## 2014-08-04 LAB — PROTIME-INR
INR: 3.59 — AB (ref 0.00–1.49)
PROTHROMBIN TIME: 36.1 s — AB (ref 11.6–15.2)

## 2014-08-05 ENCOUNTER — Encounter (HOSPITAL_COMMUNITY)
Admission: RE | Admit: 2014-08-05 | Discharge: 2014-08-05 | Disposition: A | Discharge status: Home / Self Care | Payer: Medicare Other | Source: Skilled Nursing Facility | Attending: Internal Medicine | Admitting: Internal Medicine

## 2014-08-05 LAB — PROTIME-INR
INR: 2.97 — AB (ref 0.00–1.49)
PROTHROMBIN TIME: 31.1 s — AB (ref 11.6–15.2)

## 2014-08-06 ENCOUNTER — Encounter (HOSPITAL_COMMUNITY)
Admission: RE | Admit: 2014-08-06 | Discharge: 2014-08-06 | Disposition: A | Discharge status: Home / Self Care | Payer: Medicare Other | Source: Skilled Nursing Facility | Attending: Internal Medicine | Admitting: Internal Medicine

## 2014-08-06 LAB — PROTIME-INR
INR: 2.82 — AB (ref 0.00–1.49)
PROTHROMBIN TIME: 29.9 s — AB (ref 11.6–15.2)

## 2014-08-10 ENCOUNTER — Encounter (HOSPITAL_COMMUNITY)
Admission: RE | Admit: 2014-08-10 | Discharge: 2014-08-10 | Disposition: A | Discharge status: Home / Self Care | Payer: Medicare Other | Source: Skilled Nursing Facility | Attending: Internal Medicine | Admitting: Internal Medicine

## 2014-08-10 LAB — PROTIME-INR
INR: 2.11 — AB (ref 0.00–1.49)
Prothrombin Time: 23.8 seconds — ABNORMAL HIGH (ref 11.6–15.2)

## 2014-08-13 ENCOUNTER — Non-Acute Institutional Stay (SKILLED_NURSING_FACILITY): Payer: Medicare Other | Admitting: Internal Medicine

## 2014-08-13 ENCOUNTER — Encounter (HOSPITAL_COMMUNITY)
Admission: RE | Admit: 2014-08-13 | Discharge: 2014-08-13 | Disposition: A | Discharge status: Home / Self Care | Payer: Medicare Other | Source: Skilled Nursing Facility | Attending: Internal Medicine | Admitting: Internal Medicine

## 2014-08-13 DIAGNOSIS — D699 Hemorrhagic condition, unspecified: Secondary | ICD-10-CM | POA: Diagnosis not present

## 2014-08-13 DIAGNOSIS — D6832 Hemorrhagic disorder due to extrinsic circulating anticoagulants: Secondary | ICD-10-CM

## 2014-08-13 DIAGNOSIS — T45515A Adverse effect of anticoagulants, initial encounter: Secondary | ICD-10-CM

## 2014-08-13 LAB — URINALYSIS, ROUTINE W REFLEX MICROSCOPIC
Bilirubin Urine: NEGATIVE
Glucose, UA: NEGATIVE mg/dL
KETONES UR: NEGATIVE mg/dL
NITRITE: NEGATIVE
PROTEIN: 30 mg/dL — AB
SPECIFIC GRAVITY, URINE: 1.015 (ref 1.005–1.030)
UROBILINOGEN UA: 1 mg/dL (ref 0.0–1.0)
pH: >9 — ABNORMAL HIGH (ref 5.0–8.0)

## 2014-08-13 LAB — URINE MICROSCOPIC-ADD ON

## 2014-08-13 LAB — PROTIME-INR
INR: 2.56 — ABNORMAL HIGH (ref 0.00–1.49)
Prothrombin Time: 27.7 seconds — ABNORMAL HIGH (ref 11.6–15.2)

## 2014-08-14 LAB — URINE CULTURE: Colony Count: >=100000

## 2014-08-15 ENCOUNTER — Other Ambulatory Visit (HOSPITAL_COMMUNITY)
Admission: AD | Admit: 2014-08-15 | Discharge: 2014-08-15 | Disposition: A | Discharge status: Home / Self Care | Payer: Medicare Other | Source: Skilled Nursing Facility | Attending: Internal Medicine | Admitting: Internal Medicine

## 2014-08-15 LAB — CBC
HCT: 43.9 % (ref 39.0–52.0)
Hemoglobin: 13.9 g/dL (ref 13.0–17.0)
MCH: 28.9 pg (ref 26.0–34.0)
MCHC: 31.7 g/dL (ref 30.0–36.0)
MCV: 91.3 fL (ref 78.0–100.0)
PLATELETS: 233 10*3/uL (ref 150–400)
RBC: 4.81 MIL/uL (ref 4.22–5.81)
RDW: 15.2 % (ref 11.5–15.5)
WBC: 6.6 10*3/uL (ref 4.0–10.5)

## 2014-08-15 LAB — BASIC METABOLIC PANEL
ANION GAP: 5 (ref 5–15)
BUN: 26 mg/dL — ABNORMAL HIGH (ref 6–23)
CHLORIDE: 107 mmol/L (ref 96–112)
CO2: 30 mmol/L (ref 19–32)
Calcium: 9.2 mg/dL (ref 8.4–10.5)
Creatinine, Ser: 1.1 mg/dL (ref 0.50–1.35)
GFR calc non Af Amer: 61 mL/min — ABNORMAL LOW (ref >90)
GFR, EST AFRICAN AMERICAN: 71 mL/min — AB (ref >90)
Glucose, Bld: 94 mg/dL (ref 70–99)
Potassium: 4.2 mmol/L (ref 3.5–5.1)
SODIUM: 142 mmol/L (ref 135–145)

## 2014-08-16 NOTE — Progress Notes (Addendum)
Patient ID: [name], male   DOB: December 03, 1931, 81 y.o.   MRN: [id number]                PROGRESS NOTE  DATE:  08/13/2014            FACILITY: [facility]                  LEVEL OF CARE:   SNF   Acute Visit                       HISTORY OF PRESENT ILLNESS:  This is a patient whom I admitted to the facility from home.  He has predominantly been here for rehabilitation secondary to increasing immobility at home.    Almost as soon as he got here, we have had significant difficulties with coagulation issues.  He is on Coumadin for, I think, recurrent DVTs.  I think we have finally managed to get this into the right range.  His INR was 2.11 on 08/10/2014.   Today, his PT/INR is 2.56.     There has been some suggestion of increasing confusion.  I see a urine was ordered on him on 08/13/2014.  I am looking at that urinalysis now, which shows many bacteria and white cells too numerous to count.    REVIEW OF SYSTEMS:    CHEST/RESPIRATORY:   No shortness of breath.    CARDIAC:  No chest pain.   GI:  No abdominal pain or diarrhea.   GU:  He is complaining some of dysuria.   SKIN:  Extremities:  I have not looked at his lower extremity wounds.    PHYSICAL EXAMINATION:   GENERAL APPEARANCE:  The patient is not in any distress.    CARDIOVASCULAR:   CARDIAC:  Heart sounds are normal.  He appears to be euvolemic.       GASTROINTESTINAL:   ABDOMEN:   Soft.  Somewhat distended.  No masses.   LIVER/SPLEEN/KIDNEYS:  No liver, no spleen.  No tenderness.   GENITOURINARY:   BLADDER:  No suprapubic or costovertebral angle tenderness.   SKIN:   INSPECTION:  Extremities:  His legs are wrapped.  I did not look at his wounds today.    ASSESSMENT/PLAN:                    Coagulation difficulties.  It would appear that he has stabilized somewhat on 6 mg Monday, Wednesday, and Friday, and 5.5 mg other days.    ?UTI.  I am going to need to treat this, which raises interaction difficulties  with Coumadin.  I will use Augmentin if he is not allergic.

## 2014-08-20 ENCOUNTER — Encounter (HOSPITAL_COMMUNITY)
Admission: RE | Admit: 2014-08-20 | Discharge: 2014-08-20 | Disposition: A | Discharge status: Home / Self Care | Payer: Medicare Other | Source: Skilled Nursing Facility | Attending: Internal Medicine | Admitting: Internal Medicine

## 2014-08-20 LAB — PROTIME-INR
INR: 3.18 — ABNORMAL HIGH (ref 0.00–1.49)
Prothrombin Time: 32.8 seconds — ABNORMAL HIGH (ref 11.6–15.2)

## 2014-08-22 ENCOUNTER — Non-Acute Institutional Stay (SKILLED_NURSING_FACILITY): Payer: Medicare Other | Admitting: Internal Medicine

## 2014-08-22 ENCOUNTER — Other Ambulatory Visit (HOSPITAL_COMMUNITY)
Admission: AD | Admit: 2014-08-22 | Discharge: 2014-08-22 | Disposition: A | Discharge status: Home / Self Care | Payer: Medicare Other | Source: Skilled Nursing Facility | Attending: Internal Medicine | Admitting: Internal Medicine

## 2014-08-22 DIAGNOSIS — R635 Abnormal weight gain: Secondary | ICD-10-CM

## 2014-08-22 DIAGNOSIS — R69 Illness, unspecified: Secondary | ICD-10-CM | POA: Insufficient documentation

## 2014-08-22 DIAGNOSIS — R609 Edema, unspecified: Secondary | ICD-10-CM | POA: Diagnosis not present

## 2014-08-22 DIAGNOSIS — R269 Unspecified abnormalities of gait and mobility: Secondary | ICD-10-CM

## 2014-08-22 LAB — PROTIME-INR
INR: 2.71 — ABNORMAL HIGH (ref 0.00–1.49)
Prothrombin Time: 29 seconds — ABNORMAL HIGH (ref 11.6–15.2)

## 2014-08-27 ENCOUNTER — Encounter (HOSPITAL_COMMUNITY)
Admission: RE | Admit: 2014-08-27 | Discharge: 2014-08-27 | Disposition: A | Discharge status: Home / Self Care | Payer: Medicare Other | Source: Skilled Nursing Facility | Attending: Internal Medicine | Admitting: Internal Medicine

## 2014-08-27 LAB — BASIC METABOLIC PANEL
Anion gap: 5 (ref 5–15)
BUN: 21 mg/dL (ref 6–23)
CHLORIDE: 108 mmol/L (ref 96–112)
CO2: 28 mmol/L (ref 19–32)
Calcium: 8.5 mg/dL (ref 8.4–10.5)
Creatinine, Ser: 1.05 mg/dL (ref 0.50–1.35)
GFR calc non Af Amer: 64 mL/min — ABNORMAL LOW (ref >90)
GFR, EST AFRICAN AMERICAN: 75 mL/min — AB (ref >90)
Glucose, Bld: 96 mg/dL (ref 70–99)
Potassium: 4.2 mmol/L (ref 3.5–5.1)
Sodium: 141 mmol/L (ref 135–145)

## 2014-08-27 LAB — PROTIME-INR
INR: 3.05 — AB (ref 0.00–1.49)
PROTHROMBIN TIME: 31.8 s — AB (ref 11.6–15.2)

## 2014-08-28 ENCOUNTER — Non-Acute Institutional Stay (SKILLED_NURSING_FACILITY): Payer: Medicare Other | Admitting: Internal Medicine

## 2014-08-28 DIAGNOSIS — I482 Chronic atrial fibrillation, unspecified: Secondary | ICD-10-CM

## 2014-08-28 DIAGNOSIS — J449 Chronic obstructive pulmonary disease, unspecified: Secondary | ICD-10-CM | POA: Diagnosis not present

## 2014-08-28 DIAGNOSIS — J00 Acute nasopharyngitis [common cold]: Secondary | ICD-10-CM | POA: Diagnosis not present

## 2014-08-28 DIAGNOSIS — IMO0001 Reserved for inherently not codable concepts without codable children: Secondary | ICD-10-CM

## 2014-08-28 DIAGNOSIS — I509 Heart failure, unspecified: Secondary | ICD-10-CM | POA: Diagnosis not present

## 2014-08-28 NOTE — Progress Notes (Signed)
Patient ID: [name], male   DOB: 1931/11/21, 81 y.o.   MRN: [id number]   FACILITY[facility]       LEVEL OF CARE:   SNF  This is an acute visit   CHIEF COMPLAINT:  Acute visit secondary to cold symptoms-anticoagulation management.       HISTORY OF PRESENT ILLNESS:   Patient is a pleasant 81 year old male here for rehabilitation-he does have significant venous wounds that are followed by Dr. Leanord Hawkingobson his legs are wrapped currently.  .    When he was here in April/May, he had been in hospital for recurrent DVTs, new onset atrial fibrillation, neuropathy, and mild dementia.  He was noted to have pneumonia and congestive heart failure.  His stay here is been relatively unremarkable-however he appears to have developed a cold with some nasal drainage cough nasal stuffiness-she does not complain of any shortness of breath vital signs appear to be stable although he does have somewhat variable blood pressures at times.  I know Dr. Leanord Hawkingobson did see him last week and apparently increased his Lasix secondary to concerns I suspect of increased lymphedema. Appears he gained about 4 pounds over the past several weeks although this appears to have stabilized the last week or 2  Clinically he appears to be stable rolling about the facility in his wheelchair as usual  In regards to other issues he is anticoagulated with history DVT and-fibrillation-INR yesterday was minimally supratherapeutic at 3.05 we have decreased his Coumadin to 5 mg Monday Wednesday Friday-5.5 mg all other days    PAST MEDICAL HISTORY/PROBLEM LIST:                       Atrial fibrillation.    COPD.    Severe bilateral osteoarthritis of the knees.    Chronic venous insufficiency with venous insufficiency wounds.    Urinary frequency and incontinence.    Some degree of cognitive impairment.    History of recurrent DVTs.    Peripheral vascular disease.     Glaucoma.    CURRENT MEDICATIONS:  Medication  list is reviewed.                        Lasix 60 q.d.      Cardizem CD 120 q.d.       Cardura 8 mg,  tablet q.h.s.        K-Dur 10 mEq q.d.      Tramadol 50 q.6 p.r.n.        Coumadin 5.5 mg Tuesday Thursday Saturday and Sunday-5 mg Monday Wednesday Friday.    Proscar 5 mg q.h.s.       Xalatan ophthalmic.      SOCIAL HISTORY:                   HOUSING:  Lives in [place] with his wife.   FUNCTIONAL STATUS:  They have an in-home caregiver.   As mentioned, he is no longer ambulatory.  Has a wheelchair             REVIEW OF SYSTEMS:   General no complaints of fever or chills.  Skin does not complain of rashes or itching  Head ears eyes nose mouth and throat-again does complain of some nasal drainage congestion-does not really complain of sore throat         CHEST/RESPIRATORY:  He is not complaining of shortness of breath has complained of nasal stuffiness  however and drainage--apparently occasional cough as well.     CARDIAC:  No chest pain.    GI:  As noted, episodic bowel incontinence.  No vomiting.  No abdominal pain.   GU:  Urinary  incontinence.     PHYSICAL EXAMINATION:   VITAL SIGNS:      Temperature 98.3 pulse 70 respirations 18 blood pressure variable 150/74-132/61 most recently.   GENERAL APPEARANCE:  The patient is not in any distress. [place] about in his wheelchair   Eyes appears to have some clear drainage did not note any exudate.  Oropharynx he does have some clear drainage-I do not note any exudate.  Nose has some clear drainage again from his nose.    CHEST/RESPIRATORY:  Decreased air entry bilaterally, but no crackles or wheezes.  His work of breathing is normal.   CARDIOVASCULAR:   CARDIAC:  Heart sounds are  regular irregular.  There are no murmurs.  No increase in jugular venous pressure.  He appears to be euvolemic.   GASTROINTESTINAL:   ABDOMEN: Obese with positive bowel sounds    .     CIRCULATION:   EDEMA/VARICOSITIES:  Significant  venous stasis. Currently his legs are wrapped bilaterally  Muscle skeletal-does move all tremor [place] 4 is ambulatory and wheelchair this appears to be baseline.  Neurologic appears grossly intact his speech is clear.  Psych he has a history of dementia but is pleasant and appropriate is conversant.  Labs.  08/27/2014.  Sodium 141 potassium 4.2 BUN 21 creatinine 1.05.  INR-3.05.  08/15/2014.  WBC 6.6 hemoglobin 13.9 platelets 233.     V     ASSESSMENT/PLAN:                                                 Cold symptoms-clinically he appears stable will start Mucinex 600 mg twice a day for 5 days secondary to occasional cough would like to make this productive-also Flonase 1 spray each nostril twice a day for 5 days for nasal issues.      Atrial fibrillation with a history of recurrent DVTs, I believe.  He is on Coumadin.   He was borderline super therapeutic recently again Coumadin dose slightly decreased s as noted above update INR has been ordered .    COPD.  This seems stable.    CHF.  This seems stable--weight has stabilized again Dr. Leanord Hawking did recently increase his Lasix-will need to check a BMP later this week.    At this point monitor vital signs pulse ox every shift for 48 hours also to monitor any changes  CPT-99309  .

## 2014-08-29 ENCOUNTER — Encounter: Payer: Self-pay | Admitting: Internal Medicine

## 2014-08-29 DIAGNOSIS — IMO0001 Reserved for inherently not codable concepts without codable children: Secondary | ICD-10-CM | POA: Insufficient documentation

## 2014-08-30 ENCOUNTER — Other Ambulatory Visit (HOSPITAL_COMMUNITY)
Admission: AD | Admit: 2014-08-30 | Discharge: 2014-08-30 | Disposition: A | Discharge status: Home / Self Care | Payer: Medicare Other | Source: Skilled Nursing Facility | Attending: Internal Medicine | Admitting: Internal Medicine

## 2014-08-30 ENCOUNTER — Non-Acute Institutional Stay (SKILLED_NURSING_FACILITY): Payer: Medicare Other | Admitting: Internal Medicine

## 2014-08-30 DIAGNOSIS — I509 Heart failure, unspecified: Secondary | ICD-10-CM | POA: Diagnosis not present

## 2014-08-30 DIAGNOSIS — I482 Chronic atrial fibrillation, unspecified: Secondary | ICD-10-CM

## 2014-08-30 DIAGNOSIS — Z7901 Long term (current) use of anticoagulants: Secondary | ICD-10-CM

## 2014-08-30 DIAGNOSIS — R05 Cough: Secondary | ICD-10-CM

## 2014-08-30 DIAGNOSIS — R059 Cough, unspecified: Secondary | ICD-10-CM

## 2014-08-30 DIAGNOSIS — R69 Illness, unspecified: Secondary | ICD-10-CM | POA: Insufficient documentation

## 2014-08-30 LAB — BASIC METABOLIC PANEL
ANION GAP: 6 (ref 5–15)
BUN: 23 mg/dL (ref 6–23)
CHLORIDE: 106 mmol/L (ref 96–112)
CO2: 29 mmol/L (ref 19–32)
CREATININE: 1.08 mg/dL (ref 0.50–1.35)
Calcium: 9.1 mg/dL (ref 8.4–10.5)
GFR calc Af Amer: 72 mL/min — ABNORMAL LOW (ref >90)
GFR calc non Af Amer: 62 mL/min — ABNORMAL LOW (ref >90)
Glucose, Bld: 98 mg/dL (ref 70–99)
POTASSIUM: 4.5 mmol/L (ref 3.5–5.1)
Sodium: 141 mmol/L (ref 135–145)

## 2014-08-30 LAB — CBC WITH DIFFERENTIAL/PLATELET
BASOS PCT: 0 % (ref 0–1)
Basophils Absolute: 0 10*3/uL (ref 0.0–0.1)
EOS ABS: 0.5 10*3/uL (ref 0.0–0.7)
Eosinophils Relative: 7 % — ABNORMAL HIGH (ref 0–5)
HCT: 43.1 % (ref 39.0–52.0)
HEMOGLOBIN: 13.9 g/dL (ref 13.0–17.0)
LYMPHS ABS: 1.9 10*3/uL (ref 0.7–4.0)
Lymphocytes Relative: 28 % (ref 12–46)
MCH: 29.6 pg (ref 26.0–34.0)
MCHC: 32.3 g/dL (ref 30.0–36.0)
MCV: 91.9 fL (ref 78.0–100.0)
MONOS PCT: 14 % — AB (ref 3–12)
Monocytes Absolute: 1 10*3/uL (ref 0.1–1.0)
NEUTROS ABS: 3.5 10*3/uL (ref 1.7–7.7)
NEUTROS PCT: 51 % (ref 43–77)
Platelets: 203 10*3/uL (ref 150–400)
RBC: 4.69 MIL/uL (ref 4.22–5.81)
RDW: 15.8 % — ABNORMAL HIGH (ref 11.5–15.5)
WBC: 7 10*3/uL (ref 4.0–10.5)

## 2014-08-30 LAB — PROTIME-INR
INR: 2.78 — ABNORMAL HIGH (ref 0.00–1.49)
Prothrombin Time: 29.6 seconds — ABNORMAL HIGH (ref 11.6–15.2)

## 2014-08-31 ENCOUNTER — Ambulatory Visit (HOSPITAL_COMMUNITY): Payer: Medicare Other | Attending: Internal Medicine

## 2014-08-31 DIAGNOSIS — J9811 Atelectasis: Secondary | ICD-10-CM | POA: Diagnosis not present

## 2014-08-31 DIAGNOSIS — I517 Cardiomegaly: Secondary | ICD-10-CM | POA: Diagnosis not present

## 2014-08-31 DIAGNOSIS — R0989 Other specified symptoms and signs involving the circulatory and respiratory systems: Secondary | ICD-10-CM | POA: Diagnosis not present

## 2014-09-01 NOTE — Progress Notes (Addendum)
Patient ID: [name], male   DOB: 1931-12-14, 81 y.o.   MRN: [id number]                PROGRESS NOTE  DATE:  08/22/2014            FACILITY: [facility]          LEVEL OF CARE:   SNF   Acute Visit               CHIEF COMPLAINT:  Review of increasing weight, increasing edema.       HISTORY OF PRESENT ILLNESS:  [name] is a gentleman who came here from home, largely because he has been completely unable to ambulate.    He has been noted, I think by his family, that he has actually put on weight.   Indeed, looking at his weights today, he was at 314.6 pounds on 08/20/2014.   This is up from 308 pounds on 08/08/2014.   He is on Lasix 40 mg once a day.     With regards to the original reason that he was admitted here with the gait problems, he apparently can now walk for 15 feet.  This is with his walker and the assist of one.  He then stops, I think because of breathing difficulties although I would actually like to see this myself.    PAST MEDICAL HISTORY/PROBLEM LIST:              AFib.    COPD.    Osteoarthritis.    Chronic venous insufficiency.    Some degree of cognitive impairment.    History of recurrent DVTs.  On Coumadin.     Peripheral vascular disease.    Glaucoma.    CURRENT MEDICATIONS:  Medication list is reviewed.        Cardizem CD 120 q.d.      Cardura 4 mg daily.    Lasix 40 mg daily.    Proscar 5 q.d.      Xalatan ophthalmic.        REVIEW OF SYSTEMS:    CHEST/RESPIRATORY:  The patient does not complain of shortness of breath.   CARDIAC:  No clear chest pain or palpitations.  The patient is not really sure what limits him in walking.   SKIN:  Discussed with the wound care nurse.  He has a small wound on the lateral left ankle and some new wounds on the anterior left leg.    PHYSICAL EXAMINATION:   VITAL SIGNS:   TEMPERATURE:  98.4.    O2 SATURATIONS:  93% on room air.   RESPIRATIONS:    20.     PULSE:   79.   BLOOD  PRESSURE:   124/67.   GENERAL APPEARANCE:  The patient is not in any distress.  Sitting in his wheelchair.   CHEST/RESPIRATORY:  Decreased air entry bilaterally.   No wheezing.  No crackles.  I do not believe there is any evidence of pleural effusions.     CARDIOVASCULAR:    CARDIAC:  Soft systolic murmur.   JVP is not elevated.   There is no S3.   EDEMA/VARICOSITIES:  He does have scant sacral edema.      GASTROINTESTINAL:   ABDOMEN:  Mildly to moderately distended.  No evidence of shifting dullness.   SKIN:   INSPECTION:  Extremities:  He has severe bilateral venous stasis.  He has a wound on the left anterior leg which I guess  is new, and also just below the left lateral malleolus which is an old wound.    ASSESSMENT/PLAN:            Gait ataxia, multifactorial.  I am going to see him walk.  The 15 feet with a rolling walker is an improvement from when he came in, but not as much as I would have hoped.    Weight gain.  He does seem to be fluid volume-overloaded, albeit mildly.  We will increase his Lasix to 60 mg a day.    COPD.  This is severe, but not unstable.    Lower extremity wounds.  Continue the Santyl under the Profore wrap.     CPT CODE: 96045                                                [phone]   ------------------------------------------------------------ Transthoracic Echocardiography  Patient:    [name], [name] MR #:       [id number] Study Date: 09/22/2013 Gender:     M Age:        82 Height:     203.2cm Weight:     147.7kg BSA:        2.66m^2 Pt. Status: Room:       3W05C    SONOGRAPHER  Nolon Rod, RDCS  ADMITTING    Doree Albee  ATTENDING    Rai, Ripudeep  ORDERING     Rai, Ripudeep  REFERRING    Rai, Ripudeep  PERFORMING   Chmg, Inpatient cc:  ------------------------------------------------------------ LV EF: 60% -   65%  ------------------------------------------------------------ Indications:      Atrial fibrillation -  427.31.  ------------------------------------------------------------ History:   PMH:   Chronic obstructive pulmonary disease. Risk factors:  Former tobacco use. Obese.  ------------------------------------------------------------ Study Conclusions  - Left ventricle: The cavity size was mildly dilated. Wall   thickness was increased in a pattern of moderate LVH.   Systolic function was normal. The estimated ejection   fraction was in the range of 60% to 65%. Wall motion was   normal; there were no regional wall motion abnormalities. - Aortic valve: Mild to moderate regurgitation. Valve area:   2.56cm^2(VTI). Valve area: 2.33cm^2 (Vmax). - Left atrium: The atrium was moderately dilated. - Right atrium: The atrium was mildly dilated. Transthoracic echocardiography.  M-mode, complete 2D, spectral Doppler, and color Doppler.  Height:  Height: 203.2cm. Height: 80in.  Weight:  Weight: 147.7kg. Weight: 325lb.  Body mass index:  BMI: 35.8kg/m^2.  Body surface area:    BSA: 2.60m^2.  Blood pressure:     128/70.  Patient status:  Inpatient.  Location:  ICU/CCU  ------------------------------------------------------------  ------------------------------------------------------------ Left ventricle:  The cavity size was mildly dilated. Wall thickness was increased in a pattern of moderate LVH. Systolic function was normal. The estimated ejection fraction was in the range of 60% to 65%. Wall motion was normal; there were no regional wall motion abnormalities.   ------------------------------------------------------------ Aortic valve:   The valve appears to be grossly normal. Doppler:   Mild to moderate regurgitation.    VTI ratio of LVOT to aortic valve: 0.42. Valve area: 2.56cm^2(VTI). Indexed valve area: 0.87cm^2/m^2 (VTI). Peak velocity ratio of LVOT to aortic valve: 0.38. Valve area: 2.33cm^2 (Vmax). Indexed valve area: 0.8cm^2/m^2 (Vmax).    Mean gradient: 19mm Hg (S). Peak  gradient: 29mm Hg (S).  ------------------------------------------------------------  Aorta:  Aortic root: The aortic root was normal in size. Ascending aorta: The ascending aorta was normal in size.  ------------------------------------------------------------ Mitral valve:   Structurally normal valve.   Leaflet separation was normal.  Doppler:  Transvalvular velocity was within the normal range. There was no evidence for stenosis.  No regurgitation.    Peak gradient: 4mm Hg (D).  ------------------------------------------------------------ Left atrium:  The atrium was moderately dilated.  ------------------------------------------------------------ Right ventricle:  The cavity size was normal. Systolic function was normal.  ------------------------------------------------------------ Pulmonic valve:    The valve appears to be grossly normal.  Doppler:   No significant regurgitation.  ------------------------------------------------------------ Tricuspid valve:   Structurally normal valve.    Doppler: No significant regurgitation.  ------------------------------------------------------------ Right atrium:  The atrium was mildly dilated.  ------------------------------------------------------------ Pericardium:  There was no pericardial effusion.      CLINICAL DATA:  Right lower extremity weakness.   EXAM: MRI HEAD WITHOUT CONTRAST   TECHNIQUE: Multiplanar, multiecho pulse sequences of the brain and surrounding structures were obtained without intravenous contrast.   COMPARISON:  None.   FINDINGS: Mild motion degraded examination. No reduced diffusion to suggest acute ischemia. No susceptibility artifact to suggest hemorrhage. Minimal basal ganglia mineralization.   Moderate to severe ventriculomegaly, likely on the basis of global parenchymal brain volume loss as there is overall commensurate enlargement of cerebellar folia and sulci, though there is  sulcal effacement at the convexities. 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 Minimal white matter T2 hyperintensities, less than expected for age suggest chronic small vessel ischemic disease.   No abnormal extra-axial fluid collections. Normal major intracranial vascular flow voids preserved at the skullbase, however there is mild dolichoectasia of the intracranial vessels which can be related to chronic hypertension.   Status post bilateral ocular lens implants. Trace paranasal sinus mucosal thickening without air-fluid levels. Mastoid air cells are well aerated. Mild temporomandibular osteoarthrosis. No abnormal sellar expansion. No cerebellar tonsillar ectopia.   IMPRESSION: No acute intracranial process, specifically no evidence of acute ischemia on this mildly motion degraded examination.   Sulcal effacement at the convexities may be seen with normal pressure hydrocephalus.   Mild white matter changes likely reflect chronic small vessel ischemic disease, less than expected for age in addition to multiple remote left cerebellar infarcts.

## 2014-09-02 ENCOUNTER — Encounter: Payer: Self-pay | Admitting: Internal Medicine

## 2014-09-02 DIAGNOSIS — R059 Cough, unspecified: Secondary | ICD-10-CM | POA: Insufficient documentation

## 2014-09-02 DIAGNOSIS — R05 Cough: Secondary | ICD-10-CM | POA: Insufficient documentation

## 2014-09-02 NOTE — Progress Notes (Signed)
Patient ID: [name], male   DOB: 06-26-1931, 81 y.o.   MRN: [id number]        FACILITY: [facility]       LEVEL OF CARE:   SNF  This is an acute visit   CHIEF COMPLAINT:  Acute visitfollow up cold symptoms--congestion-and anticoagulation management--. With history of atrial fibrillation and DVT      HISTORY OF PRESENT ILLNESS:   Patient is a pleasant 82 year old male here for rehabilitation-he does have significant venous wounds that are followed by Dr. Leanord Hawking his legs are wrapped currently.  .    When he was here in April/May, he had been in hospital for recurrent DVTs, new onset atrial fibrillation, neuropathy, and mild dementia.  He was noted to have pneumonia and congestive heart failure.  His stay here is been relatively unremarkable-however he appears to have developed a cold with some nasal drainage cough nasal stuffiness-he does not complain of any shortness of breath-however according in nursing he still has a somewhat persistent cough and congestion-initially he was treated with Flonase secondary to concerns of allergic rhinitis as well as Mucinex.  .  I know Dr. Leanord Hawking did see him last week and apparently increased his Lasix secondary to concerns I suspect of increased lymphedema. Appears he gained about 4 pounds over the past several weeks although this appears to have stabilized the last week or 2  Clinically he appears to be stable rolling about the facility in his wheelchair as usual  In regards to other issues he is anticoagulated with history DVT and-fibrillation He is on chronic Coumadin and INR earlier this week was slightly supratherapeutic at 3.05-this has normalized at 2.78 today-he is on alternating doses of 5 and 5-1/2 mg    PAST MEDICAL HISTORY/PROBLEM LIST:                       Atrial fibrillation.    COPD.    Severe bilateral osteoarthritis of the knees.    Chronic venous insufficiency with venous insufficiency wounds.    Urinary  frequency and incontinence.    Some degree of cognitive impairment.    History of recurrent DVTs.    Peripheral vascular disease.     Glaucoma.    CURRENT MEDICATIONS:  Medication list is reviewed.                        Lasix 60 q.d.      Cardizem CD 120 q.d.       Cardura 8 mg,  tablet q.h.s.        K-Dur 10 mEq q.d.      Tramadol 50 q.6 p.r.n.        Coumadin 5.5 mg Tuesday Thursday Saturday and Sunday-5 mg Monday Wednesday Friday.    Proscar 5 mg q.h.s.       Xalatan ophthalmic.      SOCIAL HISTORY:                   HOUSING:  Lives in [place] with his wife.   FUNCTIONAL STATUS:  They have an in-home caregiver.   As mentioned, he is no longer ambulatory.  Has a wheelchair             REVIEW OF SYSTEMS:   General no complaints of fever or chills.  Skin does not complain of rashes or itching  Head ears eyes nose mouth and throat-again does complain of some  nasal drainage congestion-does not really complain of sore throat--apparently this congestion is quite persistent         CHEST/RESPIRATORY:  He is not complaining of shortness of breath has complained of nasal stuffiness however and drainage--as well as cough-he does have a listed history of COPD     CARDIAC:  No chest pain.    GI:  As noted, episodic bowel incontinence.  No vomiting.  No abdominal pain.   GU:  Urinary  incontinence.     PHYSICAL EXAMINATION:   VITAL SIGNS:      Temperature 98.2 pulse 77 respirations 24 blood pressure 122/49.   GENERAL APPEARANCE:  The patient is not in any distress. [place] about in his wheelchair   Eyes appears to have some clear drainage did not note any exudate.  Oropharynx he does have some clear drainage-but this appears somewhat improved compared to previous exam-I do not note any exudate.  Drainage from his nose appear slightly improved compared to previous exam.    CHEST/RESPIRATORY:  Decreased air entry bilaterally, but no crackles or wheezes. There are  scattered coarse breath sounds  His work of breathing is normal.   CARDIOVASCULAR:   CARDIAC:  Heart sounds are  regular irregular.  There are no murmurs.  No increase in jugular venous pressure.  He appears to be euvolemic.   GASTROINTESTINAL:   ABDOMEN: Obese with positive bowel sounds it is nontender to palpation    .     CIRCULATION:   EDEMA/VARICOSITIES:  Significant venous stasis. Currently his legs are wrapped bilaterally  Muscle skeletal-does move all tremor [place] 4 is ambulatory and wheelchair this appears to be baseline.  Neurologic appears grossly intact his speech is clear.  Psych he has a history of dementia but is pleasant and appropriate is conversant.  Labs.  08/30/2014.  Sodium 141 potassium 4.5 BUN 23 creatinine 1.08.  INR 2.78.  WBC 7.0 hemoglobin 13.9 platelets 203  08/27/2014.  Sodium 141 potassium 4.2 BUN 21 creatinine 1.05.  INR-3.05.  08/15/2014.  WBC 6.6 hemoglobin 13.9 platelets 233.     V     ASSESSMENT/PLAN:                                                 Cold symptoms-this appears to be quite persistent with continued congestion-with his history will start Augmentin 500 mg twice a day for 7 days it appears a quinolone would be challenging with his use of Coumadin-also will extend this Mucinex until April 5-also check a chest x-ray secondary to posible chest congestion.  Also monitor closely  vital signs pulse ox every shift for 48 hours      .      [name]      Atrial fibrillation with a history of recurrent DVTs, I believe.  He is on Coumadin.--He is on Cardizem-rate appears to be controlled   He was borderline super therapeutic recently again Coumadin dose slightly decreased s as noted above update INR showed normalization this will have to be followed closely will order a PT/INR  first laboratory day next week-this will have to be monitored certainly with his history of antibiotic use which was just started.    COPD.  This  seems stable but with some possible congestion here will be aggressive and start an antibiotic.    CHF.  This seems stable--weight  has stabilized again Dr. Leanord Hawkingobson did recently increase his Lasix-will need to check a BMP next week-. BMP done today shows stability    At this point monitor vital signs pulse ox every shift for 48 hours also to monitor any changes  [id number]--of note greater than 25 minutes spent assessing patient-reviewing his chart-discussing his status with nursing staff-and coordinating  plan of care-note greater than 50% of time spent coordinating plan of care with nursing input  .

## 2014-09-03 ENCOUNTER — Encounter (HOSPITAL_COMMUNITY)
Admission: RE | Admit: 2014-09-03 | Discharge: 2014-09-03 | Disposition: A | Discharge status: Home / Self Care | Payer: Medicare Other | Source: Skilled Nursing Facility | Attending: Internal Medicine | Admitting: Internal Medicine

## 2014-09-03 DIAGNOSIS — J449 Chronic obstructive pulmonary disease, unspecified: Secondary | ICD-10-CM | POA: Diagnosis not present

## 2014-09-03 DIAGNOSIS — F0390 Unspecified dementia without behavioral disturbance: Secondary | ICD-10-CM | POA: Diagnosis not present

## 2014-09-03 DIAGNOSIS — R488 Other symbolic dysfunctions: Secondary | ICD-10-CM | POA: Diagnosis not present

## 2014-09-03 DIAGNOSIS — Z7901 Long term (current) use of anticoagulants: Secondary | ICD-10-CM | POA: Diagnosis not present

## 2014-09-03 DIAGNOSIS — M6281 Muscle weakness (generalized): Secondary | ICD-10-CM | POA: Diagnosis not present

## 2014-09-03 DIAGNOSIS — R278 Other lack of coordination: Secondary | ICD-10-CM | POA: Diagnosis not present

## 2014-09-03 LAB — BASIC METABOLIC PANEL
ANION GAP: 5 (ref 5–15)
BUN: 22 mg/dL (ref 6–23)
CALCIUM: 8.8 mg/dL (ref 8.4–10.5)
CO2: 29 mmol/L (ref 19–32)
Chloride: 107 mmol/L (ref 96–112)
Creatinine, Ser: 0.95 mg/dL (ref 0.50–1.35)
GFR calc Af Amer: 88 mL/min — ABNORMAL LOW (ref >90)
GFR calc non Af Amer: 76 mL/min — ABNORMAL LOW (ref >90)
Glucose, Bld: 98 mg/dL (ref 70–99)
Potassium: 4.1 mmol/L (ref 3.5–5.1)
Sodium: 141 mmol/L (ref 135–145)

## 2014-09-03 LAB — PROTIME-INR
INR: 2.91 — ABNORMAL HIGH (ref 0.00–1.49)
PROTHROMBIN TIME: 30.7 s — AB (ref 11.6–15.2)

## 2014-09-03 LAB — APTT: APTT: 48 s — AB (ref 24–37)

## 2014-09-05 ENCOUNTER — Encounter (HOSPITAL_COMMUNITY)
Admission: RE | Admit: 2014-09-05 | Discharge: 2014-09-05 | Disposition: A | Discharge status: Home / Self Care | Payer: Medicare Other | Source: Ambulatory Visit | Attending: Internal Medicine | Admitting: Internal Medicine

## 2014-09-05 ENCOUNTER — Non-Acute Institutional Stay (SKILLED_NURSING_FACILITY): Payer: Medicare Other | Admitting: Internal Medicine

## 2014-09-05 DIAGNOSIS — J449 Chronic obstructive pulmonary disease, unspecified: Secondary | ICD-10-CM

## 2014-09-05 DIAGNOSIS — I481 Persistent atrial fibrillation: Secondary | ICD-10-CM | POA: Diagnosis not present

## 2014-09-05 DIAGNOSIS — R269 Unspecified abnormalities of gait and mobility: Secondary | ICD-10-CM | POA: Diagnosis not present

## 2014-09-05 DIAGNOSIS — I4819 Other persistent atrial fibrillation: Secondary | ICD-10-CM

## 2014-09-05 DIAGNOSIS — I509 Heart failure, unspecified: Secondary | ICD-10-CM

## 2014-09-07 ENCOUNTER — Telehealth: Payer: Self-pay | Admitting: Family Medicine

## 2014-09-07 DIAGNOSIS — J449 Chronic obstructive pulmonary disease, unspecified: Secondary | ICD-10-CM | POA: Diagnosis not present

## 2014-09-07 DIAGNOSIS — I739 Peripheral vascular disease, unspecified: Secondary | ICD-10-CM | POA: Diagnosis not present

## 2014-09-07 DIAGNOSIS — I872 Venous insufficiency (chronic) (peripheral): Secondary | ICD-10-CM | POA: Diagnosis not present

## 2014-09-07 DIAGNOSIS — R32 Unspecified urinary incontinence: Secondary | ICD-10-CM | POA: Diagnosis not present

## 2014-09-07 DIAGNOSIS — Z5181 Encounter for therapeutic drug level monitoring: Secondary | ICD-10-CM | POA: Diagnosis not present

## 2014-09-07 DIAGNOSIS — I4891 Unspecified atrial fibrillation: Secondary | ICD-10-CM | POA: Diagnosis not present

## 2014-09-07 DIAGNOSIS — Z7901 Long term (current) use of anticoagulants: Secondary | ICD-10-CM | POA: Diagnosis not present

## 2014-09-07 DIAGNOSIS — I82402 Acute embolism and thrombosis of unspecified deep veins of left lower extremity: Secondary | ICD-10-CM | POA: Diagnosis not present

## 2014-09-07 NOTE — Telephone Encounter (Signed)
Great - I will let patient know and cancel appt for Monday.  [facility]  [facility] and [facility]'s [facility].

## 2014-09-07 NOTE — Telephone Encounter (Signed)
Called patient's wife.  Of to being him in the afternoon on 09/10/14.  I will work him in between other 2 appts.

## 2014-09-08 DIAGNOSIS — I872 Venous insufficiency (chronic) (peripheral): Secondary | ICD-10-CM | POA: Diagnosis not present

## 2014-09-08 DIAGNOSIS — Z7901 Long term (current) use of anticoagulants: Secondary | ICD-10-CM | POA: Diagnosis not present

## 2014-09-08 DIAGNOSIS — I82402 Acute embolism and thrombosis of unspecified deep veins of left lower extremity: Secondary | ICD-10-CM | POA: Diagnosis not present

## 2014-09-08 DIAGNOSIS — R32 Unspecified urinary incontinence: Secondary | ICD-10-CM | POA: Diagnosis not present

## 2014-09-08 DIAGNOSIS — Z5181 Encounter for therapeutic drug level monitoring: Secondary | ICD-10-CM | POA: Diagnosis not present

## 2014-09-08 DIAGNOSIS — I4891 Unspecified atrial fibrillation: Secondary | ICD-10-CM | POA: Diagnosis not present

## 2014-09-08 DIAGNOSIS — I739 Peripheral vascular disease, unspecified: Secondary | ICD-10-CM | POA: Diagnosis not present

## 2014-09-08 DIAGNOSIS — J449 Chronic obstructive pulmonary disease, unspecified: Secondary | ICD-10-CM | POA: Diagnosis not present

## 2014-09-10 ENCOUNTER — Telehealth: Payer: Self-pay | Admitting: *Deleted

## 2014-09-10 ENCOUNTER — Ambulatory Visit: Payer: Self-pay | Admitting: Pharmacist

## 2014-09-10 DIAGNOSIS — Z7901 Long term (current) use of anticoagulants: Secondary | ICD-10-CM | POA: Diagnosis not present

## 2014-09-10 DIAGNOSIS — I4891 Unspecified atrial fibrillation: Secondary | ICD-10-CM | POA: Diagnosis not present

## 2014-09-10 DIAGNOSIS — I739 Peripheral vascular disease, unspecified: Secondary | ICD-10-CM | POA: Diagnosis not present

## 2014-09-10 DIAGNOSIS — I82402 Acute embolism and thrombosis of unspecified deep veins of left lower extremity: Secondary | ICD-10-CM | POA: Diagnosis not present

## 2014-09-10 DIAGNOSIS — Z5181 Encounter for therapeutic drug level monitoring: Secondary | ICD-10-CM | POA: Diagnosis not present

## 2014-09-10 DIAGNOSIS — I872 Venous insufficiency (chronic) (peripheral): Secondary | ICD-10-CM | POA: Diagnosis not present

## 2014-09-10 DIAGNOSIS — R32 Unspecified urinary incontinence: Secondary | ICD-10-CM | POA: Diagnosis not present

## 2014-09-10 DIAGNOSIS — J449 Chronic obstructive pulmonary disease, unspecified: Secondary | ICD-10-CM | POA: Diagnosis not present

## 2014-09-10 LAB — POCT INR: INR: 1.6

## 2014-09-10 NOTE — Telephone Encounter (Signed)
Called patient's wife- instructions for adding 0.5mg  tu[facility]t/sat/su [facility].  Ok to start new dose. Patient has warfarin 1mg  tablets at home.  Recheck INR in 5 days.

## 2014-09-10 NOTE — Progress Notes (Addendum)
Patient ID: BISHOY CUPP, male   DOB: 03-Aug-1931, 81 y.o.   MRN: [id number]                PROGRESS NOTE  DATE:  09/05/2014            FACILITY: [facility]                     LEVEL OF CARE:   SNF   Acute Visit/Discharge Visit         CHIEF COMPLAINT:  Pre-discharge review, other issues.      HISTORY OF PRESENT ILLNESS:  This is a patient for whom I facilitated admission to this facility.  [facility] see [facility] at [facility]  His daughter had raised concern about complete inability to mobilize at home.  We managed to get him waived through the three-day traditional Medicare hospital stay.    In the wound care center, we see him predominantly for severe bilateral venous insufficiency with recurrent lower extremity wounds.    His gait problems, I think, are largely related to significant osteoarthritis, flexion contractures, and neuropathy.  All of this is chronic.    He has a history of COPD, which has been stable.    He has a history of atrial fibrillation, on chronic Coumadin.    I note that he had an echocardiogram in April 2015.  His ejection fraction in terms of left ventricular function was normal.  He had a moderate LVH.  They do not really comment on diastolic parameters, but I would be surprised if he does not have some degree of diastolic dysfunction.  He does have mild to moderate aortic valve regurgitation, moderate dilation of the left atrium, and mild dilation of the right atrium.    During his stay here, he had required an increase in Lasix as his weight went up.  I increased him from 40 to 60 mg a day before I went away on vacation on 08/22/2014.  He tolerated this well.  His weight and the swelling in his legs has gone down.  As mentioned, his current weight is 310 pounds.     His last INR check showed an INR of 2.91.  He is on Coumadin 5 mg on Monday, Wednesday, and Friday, and 5.5 mg other days.  I had wanted this rechecked in a week.  However, this  is going to have to be done at [facility].    He was also treated with antibiotics for a bronchitis while I was away.  I think a chest x-ray was done that was normal.  He had mild bibasilar subsegmental atelectasis and mild cardiomegaly without any venous congestion.    PHYSICAL EXAMINATION:   GENERAL APPEARANCE:  He appears to be stable.   CHEST/RESPIRATORY:  Shallow, but otherwise clear air entry.   CARDIOVASCULAR:   CARDIAC:  Heart sounds are distant and irregular.  JVP is elevated at 90.   There are no murmurs.   EDEMA/VARICOSITIES:  Extremities:  Severe bilateral venous stasis.   SKIN:   INSPECTION:  He has wounds on the left anterior leg, left lateral malleolus, which are not as bad as on some occasions that I have seen him.  These will go home with silver alginate and Profore wraps.    ASSESSMENT/PLAN:                  Multifactorial immobility.  The maximum he is able to  do with a lot of assistance on a walker is 15 feet.  He was at this level when I saw him on 08/22/2014.  Therapy cut him off because his insurance stopped paying last Friday.    Atrial fibrillation.  His heart rate is controlled on Coumadin.  I had wanted to recheck the Coumadin in a week's time.  However, it is not going to be possible as he is leaving tomorrow.  [facility].  I am not changing his Coumadin dose for now, with the dose quoted above.    Diastolically-mediated heart failure.  I would be surprised if he does not have this.  He is currently on 60 mg of Lasix a day.  Last lab work from 09/03/2014 showed a normal basic metabolic panel.  His potassium was 4.1, BUN 22, creatinine 0.95.    COPD.  I think this is significant, but stable.    Venous insufficiency with left leg wounds.  He will need bilateral wraps which for one reason or another we haven't been supplying consistently

## 2014-09-10 NOTE — Telephone Encounter (Signed)
Has question about how patient is going to do 0.5mg  and where these directions came from.  [facility].

## 2014-09-10 NOTE — Telephone Encounter (Signed)
Protime 19.2  INR 1.6  Taking 5mg  daily, adding 0.5mg  on tues, thurs, sat, and sun

## 2014-09-12 DIAGNOSIS — Z5181 Encounter for therapeutic drug level monitoring: Secondary | ICD-10-CM | POA: Diagnosis not present

## 2014-09-12 DIAGNOSIS — Z7901 Long term (current) use of anticoagulants: Secondary | ICD-10-CM | POA: Diagnosis not present

## 2014-09-12 DIAGNOSIS — R32 Unspecified urinary incontinence: Secondary | ICD-10-CM | POA: Diagnosis not present

## 2014-09-12 DIAGNOSIS — I872 Venous insufficiency (chronic) (peripheral): Secondary | ICD-10-CM | POA: Diagnosis not present

## 2014-09-12 DIAGNOSIS — I4891 Unspecified atrial fibrillation: Secondary | ICD-10-CM | POA: Diagnosis not present

## 2014-09-12 DIAGNOSIS — J449 Chronic obstructive pulmonary disease, unspecified: Secondary | ICD-10-CM | POA: Diagnosis not present

## 2014-09-12 DIAGNOSIS — I82402 Acute embolism and thrombosis of unspecified deep veins of left lower extremity: Secondary | ICD-10-CM | POA: Diagnosis not present

## 2014-09-12 DIAGNOSIS — I739 Peripheral vascular disease, unspecified: Secondary | ICD-10-CM | POA: Diagnosis not present

## 2014-09-13 DIAGNOSIS — I872 Venous insufficiency (chronic) (peripheral): Secondary | ICD-10-CM | POA: Diagnosis not present

## 2014-09-13 DIAGNOSIS — I739 Peripheral vascular disease, unspecified: Secondary | ICD-10-CM | POA: Diagnosis not present

## 2014-09-13 DIAGNOSIS — I4891 Unspecified atrial fibrillation: Secondary | ICD-10-CM | POA: Diagnosis not present

## 2014-09-13 DIAGNOSIS — J449 Chronic obstructive pulmonary disease, unspecified: Secondary | ICD-10-CM | POA: Diagnosis not present

## 2014-09-13 DIAGNOSIS — I82402 Acute embolism and thrombosis of unspecified deep veins of left lower extremity: Secondary | ICD-10-CM | POA: Diagnosis not present

## 2014-09-13 DIAGNOSIS — Z5181 Encounter for therapeutic drug level monitoring: Secondary | ICD-10-CM | POA: Diagnosis not present

## 2014-09-13 DIAGNOSIS — R32 Unspecified urinary incontinence: Secondary | ICD-10-CM | POA: Diagnosis not present

## 2014-09-13 DIAGNOSIS — Z7901 Long term (current) use of anticoagulants: Secondary | ICD-10-CM | POA: Diagnosis not present

## 2014-09-14 DIAGNOSIS — J449 Chronic obstructive pulmonary disease, unspecified: Secondary | ICD-10-CM | POA: Diagnosis not present

## 2014-09-14 DIAGNOSIS — I872 Venous insufficiency (chronic) (peripheral): Secondary | ICD-10-CM | POA: Diagnosis not present

## 2014-09-14 DIAGNOSIS — I82402 Acute embolism and thrombosis of unspecified deep veins of left lower extremity: Secondary | ICD-10-CM | POA: Diagnosis not present

## 2014-09-14 DIAGNOSIS — Z7901 Long term (current) use of anticoagulants: Secondary | ICD-10-CM | POA: Diagnosis not present

## 2014-09-14 DIAGNOSIS — I739 Peripheral vascular disease, unspecified: Secondary | ICD-10-CM | POA: Diagnosis not present

## 2014-09-14 DIAGNOSIS — I4891 Unspecified atrial fibrillation: Secondary | ICD-10-CM | POA: Diagnosis not present

## 2014-09-14 DIAGNOSIS — Z5181 Encounter for therapeutic drug level monitoring: Secondary | ICD-10-CM | POA: Diagnosis not present

## 2014-09-14 DIAGNOSIS — R32 Unspecified urinary incontinence: Secondary | ICD-10-CM | POA: Diagnosis not present

## 2014-09-17 ENCOUNTER — Ambulatory Visit (INDEPENDENT_AMBULATORY_CARE_PROVIDER_SITE_OTHER): Payer: Medicare Other | Admitting: Pharmacist

## 2014-09-17 ENCOUNTER — Telehealth: Payer: Self-pay | Admitting: *Deleted

## 2014-09-17 DIAGNOSIS — Z5181 Encounter for therapeutic drug level monitoring: Secondary | ICD-10-CM | POA: Diagnosis not present

## 2014-09-17 DIAGNOSIS — I872 Venous insufficiency (chronic) (peripheral): Secondary | ICD-10-CM | POA: Diagnosis not present

## 2014-09-17 DIAGNOSIS — Z7901 Long term (current) use of anticoagulants: Secondary | ICD-10-CM | POA: Diagnosis not present

## 2014-09-17 DIAGNOSIS — R32 Unspecified urinary incontinence: Secondary | ICD-10-CM | POA: Diagnosis not present

## 2014-09-17 DIAGNOSIS — I4891 Unspecified atrial fibrillation: Secondary | ICD-10-CM | POA: Diagnosis not present

## 2014-09-17 DIAGNOSIS — J449 Chronic obstructive pulmonary disease, unspecified: Secondary | ICD-10-CM | POA: Diagnosis not present

## 2014-09-17 DIAGNOSIS — I739 Peripheral vascular disease, unspecified: Secondary | ICD-10-CM | POA: Diagnosis not present

## 2014-09-17 DIAGNOSIS — I82402 Acute embolism and thrombosis of unspecified deep veins of left lower extremity: Secondary | ICD-10-CM | POA: Diagnosis not present

## 2014-09-17 LAB — POCT INR: INR: 2

## 2014-09-17 NOTE — Progress Notes (Signed)
INR checked by Home Health nurse with [facility].

## 2014-09-17 NOTE — Telephone Encounter (Signed)
therpeutic anticoagulation.  Continue current warfarin dose of 5mg  1 tablet daily and 1mg  1/2 tablet on sundays, tuesdays, thursdays and saturdays. Patient's wife and [facility]  Recheck protime in 1 week.

## 2014-09-17 NOTE — Telephone Encounter (Signed)
Pt's INR is 2.0

## 2014-09-19 ENCOUNTER — Telehealth: Payer: Self-pay | Admitting: *Deleted

## 2014-09-19 DIAGNOSIS — I739 Peripheral vascular disease, unspecified: Secondary | ICD-10-CM | POA: Diagnosis not present

## 2014-09-19 DIAGNOSIS — Z7901 Long term (current) use of anticoagulants: Secondary | ICD-10-CM | POA: Diagnosis not present

## 2014-09-19 DIAGNOSIS — I82402 Acute embolism and thrombosis of unspecified deep veins of left lower extremity: Secondary | ICD-10-CM | POA: Diagnosis not present

## 2014-09-19 DIAGNOSIS — I872 Venous insufficiency (chronic) (peripheral): Secondary | ICD-10-CM | POA: Diagnosis not present

## 2014-09-19 DIAGNOSIS — Z5181 Encounter for therapeutic drug level monitoring: Secondary | ICD-10-CM | POA: Diagnosis not present

## 2014-09-19 DIAGNOSIS — J449 Chronic obstructive pulmonary disease, unspecified: Secondary | ICD-10-CM | POA: Diagnosis not present

## 2014-09-19 DIAGNOSIS — R32 Unspecified urinary incontinence: Secondary | ICD-10-CM | POA: Diagnosis not present

## 2014-09-19 DIAGNOSIS — I4891 Unspecified atrial fibrillation: Secondary | ICD-10-CM | POA: Diagnosis not present

## 2014-09-19 NOTE — Telephone Encounter (Signed)
Pt has appt on 09/20/14 with Hyacinth MeekerMiller. His lower legs are horrible, left ankle draining constantly. Could you please do a wound culture while he is there?

## 2014-09-19 NOTE — Telephone Encounter (Signed)
Note to do wound culture added to appointment notes.

## 2014-09-20 ENCOUNTER — Encounter: Payer: Self-pay | Admitting: Family Medicine

## 2014-09-20 ENCOUNTER — Ambulatory Visit (INDEPENDENT_AMBULATORY_CARE_PROVIDER_SITE_OTHER): Payer: Medicare Other | Admitting: Family Medicine

## 2014-09-20 VITALS — BP 124/59 | HR 63 | Temp 98.9°F | Ht 79.0 in | Wt 309.0 lb

## 2014-09-20 DIAGNOSIS — I872 Venous insufficiency (chronic) (peripheral): Secondary | ICD-10-CM

## 2014-09-20 DIAGNOSIS — I480 Paroxysmal atrial fibrillation: Secondary | ICD-10-CM | POA: Diagnosis not present

## 2014-09-20 DIAGNOSIS — R600 Localized edema: Secondary | ICD-10-CM | POA: Diagnosis not present

## 2014-09-20 DIAGNOSIS — F0391 Unspecified dementia with behavioral disturbance: Secondary | ICD-10-CM

## 2014-09-20 DIAGNOSIS — I714 Abdominal aortic aneurysm, without rupture, unspecified: Secondary | ICD-10-CM

## 2014-09-20 DIAGNOSIS — J449 Chronic obstructive pulmonary disease, unspecified: Secondary | ICD-10-CM | POA: Diagnosis not present

## 2014-09-20 DIAGNOSIS — I739 Peripheral vascular disease, unspecified: Secondary | ICD-10-CM | POA: Diagnosis not present

## 2014-09-20 MED ORDER — FUROSEMIDE 80 MG PO TABS: 80.0000 mg | ORAL_TABLET | Freq: Every day | ORAL | Status: DC

## 2014-09-20 MED ORDER — METOLAZONE 2.5 MG PO TABS: ORAL_TABLET | ORAL | Status: DC

## 2014-09-20 NOTE — Patient Instructions (Signed)
Senna S for stool softener

## 2014-09-21 DIAGNOSIS — I82402 Acute embolism and thrombosis of unspecified deep veins of left lower extremity: Secondary | ICD-10-CM | POA: Diagnosis not present

## 2014-09-21 DIAGNOSIS — Z5181 Encounter for therapeutic drug level monitoring: Secondary | ICD-10-CM | POA: Diagnosis not present

## 2014-09-21 DIAGNOSIS — I4891 Unspecified atrial fibrillation: Secondary | ICD-10-CM | POA: Diagnosis not present

## 2014-09-21 DIAGNOSIS — J449 Chronic obstructive pulmonary disease, unspecified: Secondary | ICD-10-CM | POA: Diagnosis not present

## 2014-09-21 DIAGNOSIS — R32 Unspecified urinary incontinence: Secondary | ICD-10-CM | POA: Diagnosis not present

## 2014-09-21 DIAGNOSIS — Z7901 Long term (current) use of anticoagulants: Secondary | ICD-10-CM | POA: Diagnosis not present

## 2014-09-21 DIAGNOSIS — I739 Peripheral vascular disease, unspecified: Secondary | ICD-10-CM | POA: Diagnosis not present

## 2014-09-21 DIAGNOSIS — I872 Venous insufficiency (chronic) (peripheral): Secondary | ICD-10-CM | POA: Diagnosis not present

## 2014-09-21 NOTE — Progress Notes (Signed)
Subjective:    Patient ID: [name], male    DOB: 22-Nov-1931, 81 y.o.   MRN: [id number]  HPI 81 year old male with multiple medical problems including dementia, congestive heart failure, peripheral vascular disease, abdominal aortic aneurysm, and atrial fibrillation. He just finished a stay at skilled nursing for rehabilitation in which she received physical therapy as well as wound care. He is followed now by home health nurse. The family is doing her best to continue ambulation. He is a large man and there is not much help in the home. The family are trying to consider other options because it appears that he will not be able to stay at home and be cared for as he becomes more needy.  Today he is sent over by home health nurse for ostensible culture of his leg wounds. His legs are large and edematous and weeping and they're irregular wounds on both legs. Home health has been changing dressings twice a week. There is no evidence of foul drainage or cellulitis    Review of Systems  Constitutional: Positive for activity change and unexpected weight change.  HENT: Negative.   Respiratory: Positive for cough and shortness of breath.   Cardiovascular: Positive for leg swelling.  Gastrointestinal: Positive for constipation.  Neurological: Positive for weakness.  Psychiatric/Behavioral: Positive for confusion and agitation.       Patient Active Problem List   Diagnosis Date Noted  . Cough 09/02/2014  . Cold 08/29/2014  . Open wound of knee, leg (except thigh), and ankle, complicated 07/22/2014  . Rectal bleeding 01/17/2014  . DVT (deep venous thrombosis) 11/07/2013  . Hypernatremia 09/29/2013  . CHF (congestive heart failure) 09/26/2013  . Atrial fibrillation with RVR 09/22/2013  . Acute respiratory failure 09/22/2013  . Knee effusion, right 09/21/2013  . Traumatic hematoma of right knee 09/21/2013  . B12 deficiency 09/20/2013  . History of DVT (deep vein thrombosis) 09/18/2013  .  Weakness 09/18/2013  . Left leg DVT 08/03/2013  . Anticoagulation goal of INR 2 to 3 06/01/2013  . Leg DVT (deep venous thromboembolism), acute 05/04/2013  . Pain in limb-Bilateral leg 03/07/2013  . Edema-Bilat Leg and foot 03/07/2013  . Pain in joint, lower leg 01/26/2013  . Pedal edema 10/06/2012  . Chronic venous insufficiency 10/06/2012  . COPD (chronic obstructive pulmonary disease) 10/06/2012  . Obesity, unspecified 10/06/2012  . AAA (abdominal aortic aneurysm) without rupture 10/06/2012  . Cognitive impairment 10/06/2012  . Dementia 10/06/2012  . Peripheral vascular disease 09/20/2012  . Abdominal aortic aneurysm 03/01/2012   Outpatient Encounter Prescriptions as of 09/20/2014  Medication Sig  . acetaminophen (TYLENOL) 325 MG tablet Take 650 mg by mouth every 6 (six) hours as needed for mild pain, fever or headache.   . albuterol (PROVENTIL HFA;VENTOLIN HFA) 108 (90 BASE) MCG/ACT inhaler Inhale 2 puffs into the lungs every 6 (six) hours as needed for wheezing or shortness of breath.  . diltiazem (CARDIZEM) 120 MG tablet Take 1 tablet (120 mg total) by mouth daily.  [name] doxazosin (CARDURA) 8 MG tablet Take 0.5 tablets (4 mg total) by mouth at bedtime.  . finasteride (PROSCAR) 5 MG tablet Take 5 mg by mouth at bedtime.   . furosemide (LASIX) 80 MG tablet Take 1 tablet (80 mg total) by mouth daily.  [name] latanoprost (XALATAN) 0.005 % ophthalmic solution Place 1 drop into both eyes at bedtime.   . potassium chloride (K-DUR) 10 MEQ tablet TAKE 1 TABLET DAILY  . traMADol (ULTRAM) 50 MG tablet  Take 1 tablet (50 mg total) by mouth every 6 (six) hours as needed for severe pain.  [name]. warfarin (COUMADIN) 1 MG tablet Take 0.5 mg by mouth See admin instructions.  [name]. warfarin (COUMADIN) 5 MG tablet Take 5 mg by mouth daily.  . [DISCONTINUED] furosemide (LASIX) 40 MG tablet Take 1 tablet (40 mg total) by mouth daily. (Patient taking differently: Take 60 mg by mouth daily. )  . metolazone (ZAROXOLYN)  2.5 MG tablet Take on Mon, Wed, and Fri   Objective:   Physical Exam  Constitutional: He appears well-developed and well-nourished.  Cardiovascular:  He has atrial fibrillation by history but he is in normal sinus rhythm today. He does have a systolic murmur probably aortic insufficiency.  Pulmonary/Chest: Effort normal and breath sounds normal.  Musculoskeletal: He exhibits edema.  Edema is 3-4+ up to his knees and his weeping wounds are superficial and irregular almost serpentine Today these were cleaned and redressed with an Unna boot and pressure dressing with Coban  Neurological: He is alert.  Psychiatric:  He realizes that his memory is failing and does not attempt to answer some rather easy questions or pins on family to answer          Assessment & Plan:  1. Chronic venous insufficiency This is probably his major problem today in that he has leg wounds that are healing and probably will not heal unless we can combat the edema. Toward that end I am increasing his Lasix from 60-80 mg a day and adding Zaroxolyn 2.5 mg 3 days a week. He will continue with dressing changes by home health nurse and I will recheck his wounds in 2 weeks  2. Abdominal aortic aneurysm He had an ultrasound in October, 6 months ago and it was stable  3. Peripheral vascular disease Especially venous insufficiency contributes to his edema as does COPD and CHF  4. Paroxysmal atrial fibrillation Appears to be in sinus rhythm today rate is controlled with Cardizem and he is on Coumadin  5. Chronic obstructive pulmonary disease, unspecified COPD, unspecified chronic bronchitis type Respiratory system is stable but probably does contribute to right-sided heart failure and dependent edema  6. Dementia, with behavioral disturbance Dementia is worsening. I suspect this may be a mixed type of vascular and Alzheimer's  Frederica KusterStephen M Jisella Ashenfelter MD  See above. Etiology is multifactorial and represents a major  challenge to his care7. Pedal edema

## 2014-09-23 DIAGNOSIS — R32 Unspecified urinary incontinence: Secondary | ICD-10-CM | POA: Diagnosis not present

## 2014-09-23 DIAGNOSIS — J449 Chronic obstructive pulmonary disease, unspecified: Secondary | ICD-10-CM | POA: Diagnosis not present

## 2014-09-23 DIAGNOSIS — Z7901 Long term (current) use of anticoagulants: Secondary | ICD-10-CM | POA: Diagnosis not present

## 2014-09-23 DIAGNOSIS — I4891 Unspecified atrial fibrillation: Secondary | ICD-10-CM | POA: Diagnosis not present

## 2014-09-23 DIAGNOSIS — I739 Peripheral vascular disease, unspecified: Secondary | ICD-10-CM | POA: Diagnosis not present

## 2014-09-23 DIAGNOSIS — Z5181 Encounter for therapeutic drug level monitoring: Secondary | ICD-10-CM | POA: Diagnosis not present

## 2014-09-23 DIAGNOSIS — I82402 Acute embolism and thrombosis of unspecified deep veins of left lower extremity: Secondary | ICD-10-CM | POA: Diagnosis not present

## 2014-09-23 DIAGNOSIS — I872 Venous insufficiency (chronic) (peripheral): Secondary | ICD-10-CM | POA: Diagnosis not present

## 2014-09-24 DIAGNOSIS — Z7901 Long term (current) use of anticoagulants: Secondary | ICD-10-CM | POA: Diagnosis not present

## 2014-09-24 DIAGNOSIS — I4891 Unspecified atrial fibrillation: Secondary | ICD-10-CM | POA: Diagnosis not present

## 2014-09-24 DIAGNOSIS — I739 Peripheral vascular disease, unspecified: Secondary | ICD-10-CM | POA: Diagnosis not present

## 2014-09-24 DIAGNOSIS — Z5181 Encounter for therapeutic drug level monitoring: Secondary | ICD-10-CM | POA: Diagnosis not present

## 2014-09-24 DIAGNOSIS — I82402 Acute embolism and thrombosis of unspecified deep veins of left lower extremity: Secondary | ICD-10-CM | POA: Diagnosis not present

## 2014-09-24 DIAGNOSIS — R32 Unspecified urinary incontinence: Secondary | ICD-10-CM | POA: Diagnosis not present

## 2014-09-24 DIAGNOSIS — J449 Chronic obstructive pulmonary disease, unspecified: Secondary | ICD-10-CM | POA: Diagnosis not present

## 2014-09-24 DIAGNOSIS — I872 Venous insufficiency (chronic) (peripheral): Secondary | ICD-10-CM | POA: Diagnosis not present

## 2014-09-25 ENCOUNTER — Telehealth: Payer: Self-pay | Admitting: *Deleted

## 2014-09-25 DIAGNOSIS — I872 Venous insufficiency (chronic) (peripheral): Secondary | ICD-10-CM | POA: Diagnosis not present

## 2014-09-25 DIAGNOSIS — I739 Peripheral vascular disease, unspecified: Secondary | ICD-10-CM | POA: Diagnosis not present

## 2014-09-25 DIAGNOSIS — R32 Unspecified urinary incontinence: Secondary | ICD-10-CM | POA: Diagnosis not present

## 2014-09-25 DIAGNOSIS — J449 Chronic obstructive pulmonary disease, unspecified: Secondary | ICD-10-CM | POA: Diagnosis not present

## 2014-09-25 DIAGNOSIS — Z5181 Encounter for therapeutic drug level monitoring: Secondary | ICD-10-CM | POA: Diagnosis not present

## 2014-09-25 DIAGNOSIS — I4891 Unspecified atrial fibrillation: Secondary | ICD-10-CM | POA: Diagnosis not present

## 2014-09-25 DIAGNOSIS — I82402 Acute embolism and thrombosis of unspecified deep veins of left lower extremity: Secondary | ICD-10-CM | POA: Diagnosis not present

## 2014-09-25 DIAGNOSIS — Z7901 Long term (current) use of anticoagulants: Secondary | ICD-10-CM | POA: Diagnosis not present

## 2014-09-25 NOTE — Telephone Encounter (Signed)
Protime 29.9  INR 2.5.

## 2014-09-25 NOTE — Telephone Encounter (Signed)
Spoke with [name] and instructed her to tell Mr. [name] to continue taking his warfarin the same way, I also called [name] with [facility] to tell her the same thing

## 2014-09-27 ENCOUNTER — Telehealth: Payer: Self-pay | Admitting: *Deleted

## 2014-09-27 DIAGNOSIS — I739 Peripheral vascular disease, unspecified: Secondary | ICD-10-CM | POA: Diagnosis not present

## 2014-09-27 DIAGNOSIS — Z5181 Encounter for therapeutic drug level monitoring: Secondary | ICD-10-CM | POA: Diagnosis not present

## 2014-09-27 DIAGNOSIS — I82402 Acute embolism and thrombosis of unspecified deep veins of left lower extremity: Secondary | ICD-10-CM | POA: Diagnosis not present

## 2014-09-27 DIAGNOSIS — Z7901 Long term (current) use of anticoagulants: Secondary | ICD-10-CM | POA: Diagnosis not present

## 2014-09-27 DIAGNOSIS — R32 Unspecified urinary incontinence: Secondary | ICD-10-CM | POA: Diagnosis not present

## 2014-09-27 DIAGNOSIS — I4891 Unspecified atrial fibrillation: Secondary | ICD-10-CM | POA: Diagnosis not present

## 2014-09-27 DIAGNOSIS — I872 Venous insufficiency (chronic) (peripheral): Secondary | ICD-10-CM | POA: Diagnosis not present

## 2014-09-27 DIAGNOSIS — J449 Chronic obstructive pulmonary disease, unspecified: Secondary | ICD-10-CM | POA: Diagnosis not present

## 2014-09-27 NOTE — Telephone Encounter (Signed)
Per Inetta Fermoina at [facility] pt is just too weak since increasing lasix and adding zaroxolyn. Spoke with Henrene Pastorammy Eckard, he advised to hold zaroxolyn until a BMP is collected tomorrow. Tina notified and they will get BMP, wife aware.

## 2014-09-28 ENCOUNTER — Other Ambulatory Visit (HOSPITAL_COMMUNITY)
Admission: RE | Admit: 2014-09-28 | Discharge: 2014-09-28 | Disposition: A | Discharge status: Home / Self Care | Payer: Medicare Other | Source: Ambulatory Visit | Attending: Family Medicine | Admitting: Family Medicine

## 2014-09-28 DIAGNOSIS — I82402 Acute embolism and thrombosis of unspecified deep veins of left lower extremity: Secondary | ICD-10-CM | POA: Diagnosis not present

## 2014-09-28 DIAGNOSIS — R32 Unspecified urinary incontinence: Secondary | ICD-10-CM | POA: Diagnosis not present

## 2014-09-28 DIAGNOSIS — I872 Venous insufficiency (chronic) (peripheral): Secondary | ICD-10-CM | POA: Diagnosis not present

## 2014-09-28 DIAGNOSIS — I739 Peripheral vascular disease, unspecified: Secondary | ICD-10-CM | POA: Diagnosis not present

## 2014-09-28 DIAGNOSIS — J449 Chronic obstructive pulmonary disease, unspecified: Secondary | ICD-10-CM | POA: Diagnosis not present

## 2014-09-28 DIAGNOSIS — Z7901 Long term (current) use of anticoagulants: Secondary | ICD-10-CM | POA: Diagnosis not present

## 2014-09-28 DIAGNOSIS — I4891 Unspecified atrial fibrillation: Secondary | ICD-10-CM | POA: Diagnosis not present

## 2014-09-28 DIAGNOSIS — Z5181 Encounter for therapeutic drug level monitoring: Secondary | ICD-10-CM | POA: Diagnosis not present

## 2014-09-30 DIAGNOSIS — Z5181 Encounter for therapeutic drug level monitoring: Secondary | ICD-10-CM | POA: Diagnosis not present

## 2014-09-30 DIAGNOSIS — R32 Unspecified urinary incontinence: Secondary | ICD-10-CM | POA: Diagnosis not present

## 2014-09-30 DIAGNOSIS — Z7901 Long term (current) use of anticoagulants: Secondary | ICD-10-CM | POA: Diagnosis not present

## 2014-09-30 DIAGNOSIS — I4891 Unspecified atrial fibrillation: Secondary | ICD-10-CM | POA: Diagnosis not present

## 2014-09-30 DIAGNOSIS — I82402 Acute embolism and thrombosis of unspecified deep veins of left lower extremity: Secondary | ICD-10-CM | POA: Diagnosis not present

## 2014-09-30 DIAGNOSIS — I872 Venous insufficiency (chronic) (peripheral): Secondary | ICD-10-CM | POA: Diagnosis not present

## 2014-09-30 DIAGNOSIS — I739 Peripheral vascular disease, unspecified: Secondary | ICD-10-CM | POA: Diagnosis not present

## 2014-09-30 DIAGNOSIS — J449 Chronic obstructive pulmonary disease, unspecified: Secondary | ICD-10-CM | POA: Diagnosis not present

## 2014-10-01 DIAGNOSIS — J449 Chronic obstructive pulmonary disease, unspecified: Secondary | ICD-10-CM | POA: Diagnosis not present

## 2014-10-01 DIAGNOSIS — I872 Venous insufficiency (chronic) (peripheral): Secondary | ICD-10-CM | POA: Diagnosis not present

## 2014-10-01 DIAGNOSIS — Z5181 Encounter for therapeutic drug level monitoring: Secondary | ICD-10-CM | POA: Diagnosis not present

## 2014-10-01 DIAGNOSIS — I739 Peripheral vascular disease, unspecified: Secondary | ICD-10-CM | POA: Diagnosis not present

## 2014-10-01 DIAGNOSIS — I82402 Acute embolism and thrombosis of unspecified deep veins of left lower extremity: Secondary | ICD-10-CM | POA: Diagnosis not present

## 2014-10-01 DIAGNOSIS — R32 Unspecified urinary incontinence: Secondary | ICD-10-CM | POA: Diagnosis not present

## 2014-10-01 DIAGNOSIS — I4891 Unspecified atrial fibrillation: Secondary | ICD-10-CM | POA: Diagnosis not present

## 2014-10-01 DIAGNOSIS — Z7901 Long term (current) use of anticoagulants: Secondary | ICD-10-CM | POA: Diagnosis not present

## 2014-10-02 DIAGNOSIS — Z5181 Encounter for therapeutic drug level monitoring: Secondary | ICD-10-CM | POA: Diagnosis not present

## 2014-10-02 DIAGNOSIS — J449 Chronic obstructive pulmonary disease, unspecified: Secondary | ICD-10-CM | POA: Diagnosis not present

## 2014-10-02 DIAGNOSIS — Z7901 Long term (current) use of anticoagulants: Secondary | ICD-10-CM | POA: Diagnosis not present

## 2014-10-02 DIAGNOSIS — I739 Peripheral vascular disease, unspecified: Secondary | ICD-10-CM | POA: Diagnosis not present

## 2014-10-02 DIAGNOSIS — I872 Venous insufficiency (chronic) (peripheral): Secondary | ICD-10-CM | POA: Diagnosis not present

## 2014-10-02 DIAGNOSIS — I4891 Unspecified atrial fibrillation: Secondary | ICD-10-CM | POA: Diagnosis not present

## 2014-10-02 DIAGNOSIS — I82402 Acute embolism and thrombosis of unspecified deep veins of left lower extremity: Secondary | ICD-10-CM | POA: Diagnosis not present

## 2014-10-02 DIAGNOSIS — R32 Unspecified urinary incontinence: Secondary | ICD-10-CM | POA: Diagnosis not present

## 2014-10-03 ENCOUNTER — Ambulatory Visit (INDEPENDENT_AMBULATORY_CARE_PROVIDER_SITE_OTHER): Payer: Medicare Other | Admitting: Family Medicine

## 2014-10-03 ENCOUNTER — Encounter: Payer: Self-pay | Admitting: Family Medicine

## 2014-10-03 VITALS — BP 116/61 | HR 59 | Temp 98.4°F

## 2014-10-03 DIAGNOSIS — I872 Venous insufficiency (chronic) (peripheral): Secondary | ICD-10-CM | POA: Diagnosis not present

## 2014-10-03 NOTE — Progress Notes (Signed)
Subjective:    Patient ID: [name], male    DOB: 12-21-1931, 81 y.o.   MRN: [id number]  HPI 81 year old gentleman who returns today to follow-up congestive heart failure, venous insufficiency with dependent edema and leg wounds. Since his last visit here home health has been changing his dressings twice weekly. He has done pretty well as far as breathing difficulties and diuresing. We are not able to get daily weights on him which would be of value. But I do note that the legs are no longer weeping like they were 2 weeks ago. He had one episode of weakness and it was suggested that he may be diuresing too much.  Patient Active Problem List   Diagnosis Date Noted  . Cough 09/02/2014  . Cold 08/29/2014  . Open wound of knee, leg (except thigh), and ankle, complicated 07/22/2014  . Rectal bleeding 01/17/2014  . DVT (deep venous thrombosis) 11/07/2013  . Hypernatremia 09/29/2013  . CHF (congestive heart failure) 09/26/2013  . Atrial fibrillation with RVR 09/22/2013  . Acute respiratory failure 09/22/2013  . Knee effusion, right 09/21/2013  . Traumatic hematoma of right knee 09/21/2013  . B12 deficiency 09/20/2013  . History of DVT (deep vein thrombosis) 09/18/2013  . Weakness 09/18/2013  . Left leg DVT 08/03/2013  . Anticoagulation goal of INR 2 to 3 06/01/2013  . Leg DVT (deep venous thromboembolism), acute 05/04/2013  . Pain in limb-Bilateral leg 03/07/2013  . Edema-Bilat Leg and foot 03/07/2013  . Pain in joint, lower leg 01/26/2013  . Pedal edema 10/06/2012  . Chronic venous insufficiency 10/06/2012  . COPD (chronic obstructive pulmonary disease) 10/06/2012  . Obesity, unspecified 10/06/2012  . AAA (abdominal aortic aneurysm) without rupture 10/06/2012  . Cognitive impairment 10/06/2012  . Dementia 10/06/2012  . Peripheral vascular disease 09/20/2012  . Abdominal aortic aneurysm 03/01/2012   Outpatient Encounter Prescriptions as of 10/03/2014  Medication Sig  .  acetaminophen (TYLENOL) 325 MG tablet Take 650 mg by mouth every 6 (six) hours as needed for mild pain, fever or headache.   . albuterol (PROVENTIL HFA;VENTOLIN HFA) 108 (90 BASE) MCG/ACT inhaler Inhale 2 puffs into the lungs every 6 (six) hours as needed for wheezing or shortness of breath.  . diltiazem (CARDIZEM) 120 MG tablet Take 1 tablet (120 mg total) by mouth daily.  [name]. doxazosin (CARDURA) 8 MG tablet Take 0.5 tablets (4 mg total) by mouth at bedtime.  . finasteride (PROSCAR) 5 MG tablet Take 5 mg by mouth at bedtime.   . furosemide (LASIX) 80 MG tablet Take 1 tablet (80 mg total) by mouth daily.  [name]. latanoprost (XALATAN) 0.005 % ophthalmic solution Place 1 drop into both eyes at bedtime.   . potassium chloride (K-DUR) 10 MEQ tablet TAKE 1 TABLET DAILY  . traMADol (ULTRAM) 50 MG tablet Take 1 tablet (50 mg total) by mouth every 6 (six) hours as needed for severe pain.  [name]. warfarin (COUMADIN) 1 MG tablet Take 0.5 mg by mouth See admin instructions.  [name]. warfarin (COUMADIN) 5 MG tablet Take 5 mg by mouth daily.  . [DISCONTINUED] metolazone (ZAROXOLYN) 2.5 MG tablet Take on Mon, Wed, and Fri   No facility-administered encounter medications on file as of 10/03/2014.      Review of Systems  HENT: Negative.   Respiratory: Negative.   Cardiovascular: Positive for leg swelling.  Musculoskeletal: Positive for gait problem.  Neurological: Positive for weakness.  Psychiatric/Behavioral: Positive for decreased concentration.       Objective:  Physical Exam  Constitutional: He appears well-developed and well-nourished.  Cardiovascular: Normal rate.   Pulmonary/Chest: Effort normal and breath sounds normal.  Musculoskeletal: He exhibits edema.  Leg wraps and dressings were removed and legs cleaned with saline gauze the wounds have improved and are somewhat smaller there is no cellulitis or drainage or weeping. Dressings were reapplied first within the boot then absorbable cleaning then Coban  dressings.          Assessment & Plan:  1. Chronic venous insufficiency Patient is taking 80 mg of Lasix. Electrolytes were checked this week and are normal but will hold off on Zaroxolyn since increased dose of Lasix as appeared to diurese him. It is important to control edema to help the legs feel but there appears to be a fine balance between blood pressure cardiac output and volume. For that reason we will hold the Zaroxolyn.  I believe the legs are looking better. Will decrease home health nurse dressing changes to weekly in an effort to prolong that service. Family knows they can come back here if dressings need to be changed for some reason in between scheduled home health nurse visits. Otherwise, I will see him back in one month  Frederica KusterStephen M [name] MD

## 2014-10-04 DIAGNOSIS — I872 Venous insufficiency (chronic) (peripheral): Secondary | ICD-10-CM | POA: Diagnosis not present

## 2014-10-04 DIAGNOSIS — Z5181 Encounter for therapeutic drug level monitoring: Secondary | ICD-10-CM | POA: Diagnosis not present

## 2014-10-04 DIAGNOSIS — R32 Unspecified urinary incontinence: Secondary | ICD-10-CM | POA: Diagnosis not present

## 2014-10-04 DIAGNOSIS — J449 Chronic obstructive pulmonary disease, unspecified: Secondary | ICD-10-CM | POA: Diagnosis not present

## 2014-10-04 DIAGNOSIS — I739 Peripheral vascular disease, unspecified: Secondary | ICD-10-CM | POA: Diagnosis not present

## 2014-10-04 DIAGNOSIS — I4891 Unspecified atrial fibrillation: Secondary | ICD-10-CM | POA: Diagnosis not present

## 2014-10-04 DIAGNOSIS — I82402 Acute embolism and thrombosis of unspecified deep veins of left lower extremity: Secondary | ICD-10-CM | POA: Diagnosis not present

## 2014-10-04 DIAGNOSIS — Z7901 Long term (current) use of anticoagulants: Secondary | ICD-10-CM | POA: Diagnosis not present

## 2014-10-05 DIAGNOSIS — I739 Peripheral vascular disease, unspecified: Secondary | ICD-10-CM | POA: Diagnosis not present

## 2014-10-05 DIAGNOSIS — J449 Chronic obstructive pulmonary disease, unspecified: Secondary | ICD-10-CM | POA: Diagnosis not present

## 2014-10-05 DIAGNOSIS — R32 Unspecified urinary incontinence: Secondary | ICD-10-CM | POA: Diagnosis not present

## 2014-10-05 DIAGNOSIS — Z5181 Encounter for therapeutic drug level monitoring: Secondary | ICD-10-CM | POA: Diagnosis not present

## 2014-10-05 DIAGNOSIS — Z7901 Long term (current) use of anticoagulants: Secondary | ICD-10-CM | POA: Diagnosis not present

## 2014-10-05 DIAGNOSIS — I82402 Acute embolism and thrombosis of unspecified deep veins of left lower extremity: Secondary | ICD-10-CM | POA: Diagnosis not present

## 2014-10-05 DIAGNOSIS — I4891 Unspecified atrial fibrillation: Secondary | ICD-10-CM | POA: Diagnosis not present

## 2014-10-05 DIAGNOSIS — I872 Venous insufficiency (chronic) (peripheral): Secondary | ICD-10-CM | POA: Diagnosis not present

## 2014-10-07 DIAGNOSIS — Z7901 Long term (current) use of anticoagulants: Secondary | ICD-10-CM | POA: Diagnosis not present

## 2014-10-07 DIAGNOSIS — I82402 Acute embolism and thrombosis of unspecified deep veins of left lower extremity: Secondary | ICD-10-CM | POA: Diagnosis not present

## 2014-10-07 DIAGNOSIS — I739 Peripheral vascular disease, unspecified: Secondary | ICD-10-CM | POA: Diagnosis not present

## 2014-10-07 DIAGNOSIS — R32 Unspecified urinary incontinence: Secondary | ICD-10-CM | POA: Diagnosis not present

## 2014-10-07 DIAGNOSIS — I872 Venous insufficiency (chronic) (peripheral): Secondary | ICD-10-CM | POA: Diagnosis not present

## 2014-10-07 DIAGNOSIS — J449 Chronic obstructive pulmonary disease, unspecified: Secondary | ICD-10-CM | POA: Diagnosis not present

## 2014-10-07 DIAGNOSIS — I4891 Unspecified atrial fibrillation: Secondary | ICD-10-CM | POA: Diagnosis not present

## 2014-10-07 DIAGNOSIS — Z5181 Encounter for therapeutic drug level monitoring: Secondary | ICD-10-CM | POA: Diagnosis not present

## 2014-10-09 ENCOUNTER — Telehealth: Payer: Self-pay | Admitting: *Deleted

## 2014-10-09 DIAGNOSIS — I872 Venous insufficiency (chronic) (peripheral): Secondary | ICD-10-CM | POA: Diagnosis not present

## 2014-10-09 DIAGNOSIS — I4891 Unspecified atrial fibrillation: Secondary | ICD-10-CM | POA: Diagnosis not present

## 2014-10-09 DIAGNOSIS — Z5181 Encounter for therapeutic drug level monitoring: Secondary | ICD-10-CM | POA: Diagnosis not present

## 2014-10-09 DIAGNOSIS — J449 Chronic obstructive pulmonary disease, unspecified: Secondary | ICD-10-CM | POA: Diagnosis not present

## 2014-10-09 DIAGNOSIS — R32 Unspecified urinary incontinence: Secondary | ICD-10-CM | POA: Diagnosis not present

## 2014-10-09 DIAGNOSIS — I82402 Acute embolism and thrombosis of unspecified deep veins of left lower extremity: Secondary | ICD-10-CM | POA: Diagnosis not present

## 2014-10-09 DIAGNOSIS — Z7901 Long term (current) use of anticoagulants: Secondary | ICD-10-CM | POA: Diagnosis not present

## 2014-10-09 DIAGNOSIS — I739 Peripheral vascular disease, unspecified: Secondary | ICD-10-CM | POA: Diagnosis not present

## 2014-10-09 LAB — POCT INR: INR: 2.4

## 2014-10-09 NOTE — Telephone Encounter (Signed)
Protime 29.0 INR 2.4. Call to let know when we need next INR

## 2014-10-10 ENCOUNTER — Ambulatory Visit (INDEPENDENT_AMBULATORY_CARE_PROVIDER_SITE_OTHER): Payer: Medicare Other | Admitting: Pharmacist

## 2014-10-10 ENCOUNTER — Other Ambulatory Visit: Payer: Self-pay | Admitting: *Deleted

## 2014-10-10 ENCOUNTER — Other Ambulatory Visit: Payer: Self-pay

## 2014-10-10 DIAGNOSIS — I480 Paroxysmal atrial fibrillation: Secondary | ICD-10-CM | POA: Diagnosis not present

## 2014-10-10 MED ORDER — DILTIAZEM HCL 120 MG PO TABS: 120.0000 mg | ORAL_TABLET | Freq: Every day | ORAL | Status: DC

## 2014-10-10 MED ORDER — WARFARIN SODIUM 5 MG PO TABS: 5.0000 mg | ORAL_TABLET | Freq: Every day | ORAL | Status: DC

## 2014-10-10 MED ORDER — WARFARIN SODIUM 1 MG PO TABS: 0.5000 mg | ORAL_TABLET | ORAL | Status: DC

## 2014-10-10 NOTE — Telephone Encounter (Signed)
Therapeutic anticoagulation.  Continue current warfarin dose Taking a 5mg  tablet daily and 1/2 of a 1mg  tablet on sundays, tuesdays, thursdays and saturdays. Recheck INR in 1 week.  [facility] and patient notified.

## 2014-10-10 NOTE — Progress Notes (Signed)
Patient's INR is checked at home by [facility]. Billing once per month interupertation fee.  Patient diagnosis - atrial fibrillations, DVT Procedure code if G0250

## 2014-10-11 DIAGNOSIS — R32 Unspecified urinary incontinence: Secondary | ICD-10-CM | POA: Diagnosis not present

## 2014-10-11 DIAGNOSIS — J449 Chronic obstructive pulmonary disease, unspecified: Secondary | ICD-10-CM | POA: Diagnosis not present

## 2014-10-11 DIAGNOSIS — I872 Venous insufficiency (chronic) (peripheral): Secondary | ICD-10-CM | POA: Diagnosis not present

## 2014-10-11 DIAGNOSIS — Z5181 Encounter for therapeutic drug level monitoring: Secondary | ICD-10-CM | POA: Diagnosis not present

## 2014-10-11 DIAGNOSIS — Z7901 Long term (current) use of anticoagulants: Secondary | ICD-10-CM | POA: Diagnosis not present

## 2014-10-11 DIAGNOSIS — I739 Peripheral vascular disease, unspecified: Secondary | ICD-10-CM | POA: Diagnosis not present

## 2014-10-11 DIAGNOSIS — I4891 Unspecified atrial fibrillation: Secondary | ICD-10-CM | POA: Diagnosis not present

## 2014-10-11 DIAGNOSIS — I82402 Acute embolism and thrombosis of unspecified deep veins of left lower extremity: Secondary | ICD-10-CM | POA: Diagnosis not present

## 2014-10-14 ENCOUNTER — Emergency Department (HOSPITAL_COMMUNITY): Payer: Medicare Other

## 2014-10-14 ENCOUNTER — Inpatient Hospital Stay (HOSPITAL_COMMUNITY)
Admission: EM | Admit: 2014-10-14 | Discharge: 2014-10-17 | DRG: 872 | Disposition: A | Discharge status: Home Health | Payer: Medicare Other | Attending: Internal Medicine | Admitting: Internal Medicine

## 2014-10-14 ENCOUNTER — Encounter (HOSPITAL_COMMUNITY): Payer: Self-pay

## 2014-10-14 DIAGNOSIS — Z86718 Personal history of other venous thrombosis and embolism: Secondary | ICD-10-CM

## 2014-10-14 DIAGNOSIS — Z809 Family history of malignant neoplasm, unspecified: Secondary | ICD-10-CM

## 2014-10-14 DIAGNOSIS — A419 Sepsis, unspecified organism: Principal | ICD-10-CM | POA: Diagnosis present

## 2014-10-14 DIAGNOSIS — N400 Enlarged prostate without lower urinary tract symptoms: Secondary | ICD-10-CM | POA: Diagnosis not present

## 2014-10-14 DIAGNOSIS — R000 Tachycardia, unspecified: Secondary | ICD-10-CM | POA: Diagnosis not present

## 2014-10-14 DIAGNOSIS — J449 Chronic obstructive pulmonary disease, unspecified: Secondary | ICD-10-CM | POA: Diagnosis not present

## 2014-10-14 DIAGNOSIS — I420 Dilated cardiomyopathy: Secondary | ICD-10-CM | POA: Diagnosis not present

## 2014-10-14 DIAGNOSIS — Z5181 Encounter for therapeutic drug level monitoring: Secondary | ICD-10-CM | POA: Diagnosis not present

## 2014-10-14 DIAGNOSIS — I714 Abdominal aortic aneurysm, without rupture, unspecified: Secondary | ICD-10-CM | POA: Diagnosis present

## 2014-10-14 DIAGNOSIS — F068 Other specified mental disorders due to known physiological condition: Secondary | ICD-10-CM | POA: Diagnosis present

## 2014-10-14 DIAGNOSIS — E538 Deficiency of other specified B group vitamins: Secondary | ICD-10-CM | POA: Diagnosis not present

## 2014-10-14 DIAGNOSIS — Z66 Do not resuscitate: Secondary | ICD-10-CM | POA: Diagnosis not present

## 2014-10-14 DIAGNOSIS — L97909 Non-pressure chronic ulcer of unspecified part of unspecified lower leg with unspecified severity: Secondary | ICD-10-CM

## 2014-10-14 DIAGNOSIS — Z87891 Personal history of nicotine dependence: Secondary | ICD-10-CM | POA: Diagnosis not present

## 2014-10-14 DIAGNOSIS — I4891 Unspecified atrial fibrillation: Secondary | ICD-10-CM | POA: Diagnosis present

## 2014-10-14 DIAGNOSIS — I480 Paroxysmal atrial fibrillation: Secondary | ICD-10-CM | POA: Diagnosis present

## 2014-10-14 DIAGNOSIS — N179 Acute kidney failure, unspecified: Secondary | ICD-10-CM | POA: Diagnosis not present

## 2014-10-14 DIAGNOSIS — I429 Cardiomyopathy, unspecified: Secondary | ICD-10-CM

## 2014-10-14 DIAGNOSIS — N390 Urinary tract infection, site not specified: Secondary | ICD-10-CM | POA: Diagnosis present

## 2014-10-14 DIAGNOSIS — R601 Generalized edema: Secondary | ICD-10-CM | POA: Diagnosis present

## 2014-10-14 DIAGNOSIS — M6281 Muscle weakness (generalized): Secondary | ICD-10-CM | POA: Diagnosis not present

## 2014-10-14 DIAGNOSIS — R4189 Other symptoms and signs involving cognitive functions and awareness: Secondary | ICD-10-CM | POA: Diagnosis present

## 2014-10-14 DIAGNOSIS — N3000 Acute cystitis without hematuria: Secondary | ICD-10-CM | POA: Diagnosis not present

## 2014-10-14 DIAGNOSIS — B9689 Other specified bacterial agents as the cause of diseases classified elsewhere: Secondary | ICD-10-CM | POA: Diagnosis present

## 2014-10-14 DIAGNOSIS — Z7901 Long term (current) use of anticoagulants: Secondary | ICD-10-CM | POA: Diagnosis not present

## 2014-10-14 DIAGNOSIS — Z8249 Family history of ischemic heart disease and other diseases of the circulatory system: Secondary | ICD-10-CM

## 2014-10-14 DIAGNOSIS — H409 Unspecified glaucoma: Secondary | ICD-10-CM | POA: Diagnosis present

## 2014-10-14 DIAGNOSIS — R509 Fever, unspecified: Secondary | ICD-10-CM | POA: Diagnosis not present

## 2014-10-14 DIAGNOSIS — Z79899 Other long term (current) drug therapy: Secondary | ICD-10-CM

## 2014-10-14 DIAGNOSIS — I83009 Varicose veins of unspecified lower extremity with ulcer of unspecified site: Secondary | ICD-10-CM | POA: Diagnosis not present

## 2014-10-14 DIAGNOSIS — R5381 Other malaise: Secondary | ICD-10-CM | POA: Diagnosis not present

## 2014-10-14 DIAGNOSIS — F0390 Unspecified dementia without behavioral disturbance: Secondary | ICD-10-CM | POA: Diagnosis not present

## 2014-10-14 DIAGNOSIS — L03116 Cellulitis of left lower limb: Secondary | ICD-10-CM | POA: Diagnosis not present

## 2014-10-14 DIAGNOSIS — L03119 Cellulitis of unspecified part of limb: Secondary | ICD-10-CM

## 2014-10-14 DIAGNOSIS — L03115 Cellulitis of right lower limb: Secondary | ICD-10-CM | POA: Diagnosis not present

## 2014-10-14 DIAGNOSIS — I509 Heart failure, unspecified: Secondary | ICD-10-CM | POA: Diagnosis not present

## 2014-10-14 DIAGNOSIS — J438 Other emphysema: Secondary | ICD-10-CM | POA: Insufficient documentation

## 2014-10-14 DIAGNOSIS — I872 Venous insufficiency (chronic) (peripheral): Secondary | ICD-10-CM | POA: Diagnosis present

## 2014-10-14 LAB — CBC WITH DIFFERENTIAL/PLATELET
BASOS PCT: 0 % (ref 0–1)
Basophils Absolute: 0 10*3/uL (ref 0.0–0.1)
EOS PCT: 1 % (ref 0–5)
Eosinophils Absolute: 0.1 10*3/uL (ref 0.0–0.7)
HEMATOCRIT: 43.9 % (ref 39.0–52.0)
Hemoglobin: 14.3 g/dL (ref 13.0–17.0)
LYMPHS ABS: 0.6 10*3/uL — AB (ref 0.7–4.0)
LYMPHS PCT: 5 % — AB (ref 12–46)
MCH: 29.2 pg (ref 26.0–34.0)
MCHC: 32.6 g/dL (ref 30.0–36.0)
MCV: 89.6 fL (ref 78.0–100.0)
Monocytes Absolute: 1 10*3/uL (ref 0.1–1.0)
Monocytes Relative: 8 % (ref 3–12)
NEUTROS ABS: 11.2 10*3/uL — AB (ref 1.7–7.7)
NEUTROS PCT: 86 % — AB (ref 43–77)
PLATELETS: 219 10*3/uL (ref 150–400)
RBC: 4.9 MIL/uL (ref 4.22–5.81)
RDW: 15.4 % (ref 11.5–15.5)
WBC: 12.9 10*3/uL — ABNORMAL HIGH (ref 4.0–10.5)

## 2014-10-14 LAB — COMPREHENSIVE METABOLIC PANEL
ALBUMIN: 3.6 g/dL (ref 3.5–5.0)
ALK PHOS: 79 U/L (ref 38–126)
ALT: 20 U/L (ref 17–63)
AST: 22 U/L (ref 15–41)
Anion gap: 9 (ref 5–15)
BUN: 18 mg/dL (ref 6–20)
CO2: 28 mmol/L (ref 22–32)
Calcium: 9.1 mg/dL (ref 8.9–10.3)
Chloride: 101 mmol/L (ref 101–111)
Creatinine, Ser: 1.33 mg/dL — ABNORMAL HIGH (ref 0.61–1.24)
GFR calc Af Amer: 56 mL/min — ABNORMAL LOW (ref >60)
GFR, EST NON AFRICAN AMERICAN: 48 mL/min — AB (ref >60)
Glucose, Bld: 138 mg/dL — ABNORMAL HIGH (ref 65–99)
Potassium: 3.9 mmol/L (ref 3.5–5.1)
SODIUM: 138 mmol/L (ref 135–145)
TOTAL PROTEIN: 7.1 g/dL (ref 6.5–8.1)
Total Bilirubin: 0.9 mg/dL (ref 0.3–1.2)

## 2014-10-14 LAB — URINE MICROSCOPIC-ADD ON

## 2014-10-14 LAB — URINALYSIS, ROUTINE W REFLEX MICROSCOPIC
Bilirubin Urine: NEGATIVE
GLUCOSE, UA: NEGATIVE mg/dL
KETONES UR: NEGATIVE mg/dL
Nitrite: POSITIVE — AB
PROTEIN: NEGATIVE mg/dL
SPECIFIC GRAVITY, URINE: 1.018 (ref 1.005–1.030)
UROBILINOGEN UA: 4 mg/dL — AB (ref 0.0–1.0)
pH: 7 (ref 5.0–8.0)

## 2014-10-14 LAB — I-STAT CG4 LACTIC ACID, ED: LACTIC ACID, VENOUS: 1.49 mmol/L (ref 0.5–2.0)

## 2014-10-14 LAB — TROPONIN I

## 2014-10-14 MED ORDER — PIPERACILLIN-TAZOBACTAM 3.375 G IVPB 30 MIN
3.3750 g | Freq: Once | INTRAVENOUS | Status: AC
  Administered 2014-10-14: 3.375 g via INTRAVENOUS
  Filled 2014-10-14: qty 50

## 2014-10-14 MED ORDER — VANCOMYCIN HCL IN DEXTROSE 1-5 GM/200ML-% IV SOLN
1000.0000 mg | Freq: Once | INTRAVENOUS | Status: AC
  Administered 2014-10-15: 1000 mg via INTRAVENOUS
  Filled 2014-10-14: qty 200

## 2014-10-14 NOTE — ED Provider Notes (Signed)
CSN: [id number]     Arrival date & time 10/14/14  2050 History   First MD Initiated Contact with Patient 10/14/14 2100     Chief Complaint  Patient presents with  . Fever    Level V caveat due to cognitive impairment (Consider location/radiation/quality/duration/timing/severity/associated sxs/prior Treatment) Patient is a 81 y.o. male presenting with fever. The history is provided by the patient and a relative.  Fever Associated symptoms: confusion and cough    patient presents with fever and generalized weakness. Reportedly had been getting up at home and became weak in his legs and had to sit down. No injury from that. Has had fevers reportedly up to 101.7 at home. Has had a little bit of cough. Minimal sputum production. No diarrhea. No nausea vomiting. He does have some chronic venous wounds on his legs that have reprtedly been doing well. He has some dressings on them.  Past Medical History  Diagnosis Date  . Venous insufficiency   . AAA (abdominal aortic aneurysm)   . Ulcer   . DVT (deep venous thrombosis) 05/04/2013    "LLE"  . Cellulitis   . Nicotine dependence   . Bronchospasm   . Elevated PSA   . BPH (benign prostatic hyperplasia)   . COPD (chronic obstructive pulmonary disease)   . Cognitive impairment   . Daily headache   . Arthritis     "probably on the right leg/hip" (09/20/2013)  . Dementia     "don't know exactly what kind" (09/20/2013)  . Kidney stones   . Glaucoma, both eyes   . Atrial fibrillation   . Fall at home September 18, 2013   Past Surgical History  Procedure Laterality Date  . Cataract extraction w/ intraocular lens  implant, bilateral Bilateral   . Insitu percutaneous pinning femur Right 1986  . Hip fracture surgery Right 2009  . Back surgery    . Posterior laminectomy / decompression lumbar spine  2004    Decompressive laminectomy unilaterally on the right at L4-5 with  . Femur fracture surgery Right 04/1985  . Cystoscopy w/ stone manipulation   1970's    "once"  . Spine surgery    . Eye surgery    . Cholecystectomy  2011    Gall Bladder   Family History  Problem Relation Age of Onset  . Aneurysm Brother   . Heart attack Brother     10-29-12  . Hypertension Brother   . Heart disease Brother     AAA  . Cancer Father     colon   History  Substance Use Topics  . Smoking status: Former Smoker -- 2.00 packs/day for 54 years    Types: Cigarettes    Quit date: 08/24/2002  . Smokeless tobacco: Current User    Types: Snuff     Comment: 09/20/2013 "dips snuff"  . Alcohol Use: No    Review of Systems  Unable to perform ROS Constitutional: Positive for fever.  Respiratory: Positive for cough.   Cardiovascular: Positive for leg swelling.  Gastrointestinal: Negative for abdominal pain.  Genitourinary: Negative for flank pain.  Psychiatric/Behavioral: Positive for confusion.      Allergies  Oxycodone hcl and Propoxyphene n-acetaminophen  Home Medications   Prior to Admission medications   Medication Sig Start Date End Date Taking? Authorizing Provider  acetaminophen (TYLENOL) 500 MG tablet Take 1,000 mg by mouth every 6 (six) hours as needed (pain).   Yes Historical Provider, MD  diltiazem (CARDIZEM CD) 120 MG 24 hr capsule  Take 120 mg by mouth daily.  10/10/14  Yes Historical Provider, MD  doxazosin (CARDURA) 4 MG tablet Take 4 mg by mouth at bedtime.  09/06/14  Yes Historical Provider, MD  finasteride (PROSCAR) 5 MG tablet Take 5 mg by mouth at bedtime.    Yes Historical Provider, MD  furosemide (LASIX) 20 MG tablet Take 80 mg by mouth daily.  09/06/14  Yes Historical Provider, MD  latanoprost (XALATAN) 0.005 % ophthalmic solution Place 1 drop into both eyes at bedtime.  01/25/12  Yes Historical Provider, MD  potassium chloride (K-DUR) 10 MEQ tablet TAKE 1 TABLET DAILY 02/23/14  Yes Frederica Kuster, MD  warfarin (COUMADIN) 5 MG tablet Take 1 tablet (5 mg total) by mouth daily. Patient taking differently: Take 5-7.5 mg by  mouth daily with supper. Take 1 1/2 tablets (7.5 mg) on Sunday, Tuesday, Thursday and Saturday, take 1 tablet (5 mg) on Monday, Wednesday, Friday 10/10/14  Yes Frederica Kuster, MD  ZINC OXIDE EX Apply 1 application topically once a week. Apply to legs under wrapping once a week   Yes Historical Provider, MD  albuterol (PROVENTIL HFA;VENTOLIN HFA) 108 (90 BASE) MCG/ACT inhaler Inhale 2 puffs into the lungs every 6 (six) hours as needed for wheezing or shortness of breath. Patient not taking: Reported on 10/14/2014 09/25/13   Ripudeep Jenna Luo, MD  diltiazem (CARDIZEM) 120 MG tablet Take 1 tablet (120 mg total) by mouth daily. Patient not taking: Reported on 10/14/2014 10/10/14   Frederica Kuster, MD  furosemide (LASIX) 80 MG tablet Take 1 tablet (80 mg total) by mouth daily. Patient not taking: Reported on 10/14/2014 09/20/14   Frederica Kuster, MD  traMADol (ULTRAM) 50 MG tablet Take 1 tablet (50 mg total) by mouth every 6 (six) hours as needed for severe pain. Patient not taking: Reported on 10/14/2014 07/20/14   Kirt Boys, DO  warfarin (COUMADIN) 1 MG tablet Take 0.5 tablets (0.5 mg total) by mouth See admin instructions. Take 1/2 tab po every sun,tues,thurs and Saturday. Patient not taking: Reported on 10/14/2014 10/10/14   Frederica Kuster, MD   BP 123/58 mmHg  Pulse 98  Temp(Src) 101.1 F (38.4 C) (Rectal)  Resp 37  SpO2 96% Physical Exam  Constitutional: He appears well-developed.  HENT:  Head: Normocephalic.  Cardiovascular:  Mild tachycardia  Pulmonary/Chest: Effort normal. He has no rales.  Mildly harsh breath sounds  Abdominal: There is no tenderness.  Musculoskeletal: He exhibits edema.  Neurological: He is alert.  Some confusion but near baseline per family members  Skin:  Chronic venous changes with dressings over bilateral lower legs. Dressing removed and showed some swelling with diffuse erythema and some purple discoloration.    ED Course  Procedures (including critical  care time) Labs Review Labs Reviewed  COMPREHENSIVE METABOLIC PANEL - Abnormal; Notable for the following:    Glucose, Bld 138 (*)    Creatinine, Ser 1.33 (*)    GFR calc non Af Amer 48 (*)    GFR calc Af Amer 56 (*)    All other components within normal limits  CBC WITH DIFFERENTIAL/PLATELET - Abnormal; Notable for the following:    WBC 12.9 (*)    Neutrophils Relative % 86 (*)    Neutro Abs 11.2 (*)    Lymphocytes Relative 5 (*)    Lymphs Abs 0.6 (*)    All other components within normal limits  URINALYSIS, ROUTINE W REFLEX MICROSCOPIC - Abnormal; Notable for the following:  APPearance TURBID (*)    Hgb urine dipstick MODERATE (*)    Urobilinogen, UA 4.0 (*)    Nitrite POSITIVE (*)    Leukocytes, UA LARGE (*)    All other components within normal limits  URINE MICROSCOPIC-ADD ON - Abnormal; Notable for the following:    Bacteria, UA MANY (*)    All other components within normal limits  URINE CULTURE  TROPONIN I  I-STAT CG4 LACTIC ACID, ED    Imaging Review Dg Chest 2 View  10/14/2014   CLINICAL DATA:  Acute onset of fever.  Initial encounter.  EXAM: CHEST  2 VIEW  COMPARISON:  Chest radiograph performed 08/31/2014  FINDINGS: The lungs are well-aerated. Vascular congestion is noted. Mildly increased interstitial markings raise concern for mild interstitial edema. No definite pleural effusion or pneumothorax is seen.  The heart is borderline normal in size. No acute osseous abnormalities are seen.  IMPRESSION: Vascular congestion noted. Mildly increased interstitial markings raise concern for mild interstitial edema.   Electronically Signed   By: Roanna RaiderJeffery  Chang M.D.   On: 10/14/2014 22:31     EKG Interpretation   Date/Time:  Sunday Oct 14 2014 21:04:21 EDT Ventricular Rate:  98 PR Interval:  230 QRS Duration: 121 QT Interval:  341 QTC Calculation: 435 R Axis:   -48 Text Interpretation:  Sinus rhythm Prolonged PR interval Nonspecific IVCD  with LAD Confirmed by  Rubin PayorPICKERING  MD, Harrold DonathNATHAN [phone]([id number] on 10/14/2014 11:33:03  PM      MDM   Final diagnoses:  Acute cystitis without hematuria  Cellulitis of lower extremity, unspecified laterality    Patient with fever and generalized weakness. Likely sources are urinary tract infection and may have cellulitis on both lower legs. Will admit to internal medicine. Normal lactic acid. Has been inpatient in a nursing home with the last 60 days.    Benjiman CoreNathan Lillyen Schow, MD 10/14/14 [phone]

## 2014-10-14 NOTE — ED Notes (Signed)
Per EMS: Pt from home with wife. Attempting to move from wheelchair to bed, pt slipped and fell. Pt denies any pain. Family noted a fever and cough. Hx. Venous insufficency and loss of motor function to legs. Family reports fever 101.4 orally.

## 2014-10-14 NOTE — H&P (Signed)
[facility] History and Physical  [name] ZOX:[id number] DOB: Jun 16, 1931 DOA: 10/14/2014  Referring physician:  PCP: Frederica Kuster, MD  Specialists:   Chief Complaint: Urosepsis  HPI: [name] is a 81 y.o. WM PMHx  cognitive impairment, dementia, left leg DVT on anticoagulation, atrial fibrillation with RVR, Cardiomyopathy AAA, venous insufficiency, atrial fibrillation, COPD, falls.  Fever Associated symptoms: confusion and cough   patient presents with fever and generalized weakness. Reportedly had been getting up at home and became weak in his legs and had to sit down. No injury from that. Has had fevers reportedly up to 101.7 at home. Has had a little bit of cough. Minimal sputum production. No diarrhea. No nausea vomiting. He does have some chronic venous wounds on his legs that have reprtedly been doing well. He has some dressings on them.    Review of Systems: The patient denies anorexia, fever, weight loss,, vision loss, decreased hearing, hoarseness, chest pain, syncope, dyspnea on exertion, peripheral edema, balance deficits, hemoptysis, abdominal pain, melena, hematochezia, severe indigestion/heartburn, hematuria, incontinence, genital sores,  suspicious skin lesions, transient blindness, depression, unusual weight change, abnormal bleeding, enlarged lymph nodes, angioedema, and breast masses.    TRAVEL HISTORY: NA   Consultants:  NA  Procedure/Significant Events:  09/22/2013;- Left ventricle: mildly dilated. Moderate LVH.-LVEF=  60% to 65%.  - Left atrium: moderately dilated.- Right atrium: mildly dilated. 5/15 CXR;Vascular congestion noted. Mildly increased interstitial markings raise concern for mild interstitial edema    Culture  5/15 urine pending 5/15 blood pending   Antibiotics:  Zosyn 5/15 1 dose Ceftriaxone 5/16   DVT prophylaxis:  Warfarin  Devices     LINES / TUBES:     Past Medical History  Diagnosis Date  . Venous  insufficiency   . AAA (abdominal aortic aneurysm)   . Ulcer   . DVT (deep venous thrombosis) 05/04/2013    "LLE"  . Cellulitis   . Nicotine dependence   . Bronchospasm   . Elevated PSA   . BPH (benign prostatic hyperplasia)   . COPD (chronic obstructive pulmonary disease)   . Cognitive impairment   . Daily headache   . Arthritis     "probably on the right leg/hip" (09/20/2013)  . Dementia     "don't know exactly what kind" (09/20/2013)  . Kidney stones   . Glaucoma, both eyes   . Atrial fibrillation   . Fall at home September 18, 2013   Past Surgical History  Procedure Laterality Date  . Cataract extraction w/ intraocular lens  implant, bilateral Bilateral   . Insitu percutaneous pinning femur Right 1986  . Hip fracture surgery Right 2009  . Back surgery    . Posterior laminectomy / decompression lumbar spine  2004    Decompressive laminectomy unilaterally on the right at L4-5 with  . Femur fracture surgery Right 04/1985  . Cystoscopy w/ stone manipulation  1970's    "once"  . Spine surgery    . Eye surgery    . Cholecystectomy  2011    Gall Bladder   Social History:  reports that he quit smoking about 12 years ago. His smoking use included Cigarettes. He has a 108 pack-year smoking history. His smokeless tobacco use includes Snuff. He reports that he does not drink alcohol or use illicit drugs. where does patient live--home, ALF, SNF? Home with wife  Can patient participate in ADLs? Yes  Allergies  Allergen Reactions  . Oxycodone Hcl Other (See Comments)  makes pt "wild"  . Propoxyphene N-Acetaminophen Other (See Comments)     Makes pt. "wild"    Family History  Problem Relation Age of Onset  . Aneurysm Brother   . Heart attack Brother     10-29-12  . Hypertension Brother   . Heart disease Brother     AAA  . Cancer Father     colon   Spoke with patient/family members and no pertinent family history  Prior to Admission medications   Medication Sig Start Date  End Date Taking? Authorizing Provider  acetaminophen (TYLENOL) 500 MG tablet Take 1,000 mg by mouth every 6 (six) hours as needed (pain).   Yes Historical Provider, MD  diltiazem (CARDIZEM CD) 120 MG 24 hr capsule Take 120 mg by mouth daily.  10/10/14  Yes Historical Provider, MD  doxazosin (CARDURA) 4 MG tablet Take 4 mg by mouth at bedtime.  09/06/14  Yes Historical Provider, MD  finasteride (PROSCAR) 5 MG tablet Take 5 mg by mouth at bedtime.    Yes Historical Provider, MD  furosemide (LASIX) 20 MG tablet Take 80 mg by mouth daily.  09/06/14  Yes Historical Provider, MD  latanoprost (XALATAN) 0.005 % ophthalmic solution Place 1 drop into both eyes at bedtime.  01/25/12  Yes Historical Provider, MD  potassium chloride (K-DUR) 10 MEQ tablet TAKE 1 TABLET DAILY 02/23/14  Yes Frederica Kuster, MD  warfarin (COUMADIN) 5 MG tablet Take 1 tablet (5 mg total) by mouth daily. Patient taking differently: Take 5-7.5 mg by mouth daily with supper. Take 1 1/2 tablets (7.5 mg) on Sunday, Tuesday, Thursday and Saturday, take 1 tablet (5 mg) on Monday, Wednesday, Friday 10/10/14  Yes Frederica Kuster, MD  ZINC OXIDE EX Apply 1 application topically once a week. Apply to legs under wrapping once a week   Yes Historical Provider, MD  albuterol (PROVENTIL HFA;VENTOLIN HFA) 108 (90 BASE) MCG/ACT inhaler Inhale 2 puffs into the lungs every 6 (six) hours as needed for wheezing or shortness of breath. Patient not taking: Reported on 10/14/2014 09/25/13   Ripudeep Jenna Luo, MD  diltiazem (CARDIZEM) 120 MG tablet Take 1 tablet (120 mg total) by mouth daily. Patient not taking: Reported on 10/14/2014 10/10/14   Frederica Kuster, MD  furosemide (LASIX) 80 MG tablet Take 1 tablet (80 mg total) by mouth daily. Patient not taking: Reported on 10/14/2014 09/20/14   Frederica Kuster, MD  traMADol (ULTRAM) 50 MG tablet Take 1 tablet (50 mg total) by mouth every 6 (six) hours as needed for severe pain. Patient not taking: Reported on 10/14/2014  07/20/14   Kirt Boys, DO  warfarin (COUMADIN) 1 MG tablet Take 0.5 tablets (0.5 mg total) by mouth See admin instructions. Take 1/2 tab po every sun,tues,thurs and Saturday. Patient not taking: Reported on 10/14/2014 10/10/14   Frederica Kuster, MD   Physical Exam: Filed Vitals:   10/15/14 0045 10/15/14 0100 10/15/14 0105 10/15/14 0129  BP: 118/57 125/58  118/70  Pulse: 92 93  91  Temp:   99.9 F (37.7 C) 100.6 F (38.1 C)  TempSrc:   Oral Oral  Resp: Height:     (2.007 m)  Weight:    132.2 kg (291 lb 7.2 oz)  SpO2: 96% 97%  99%     General:  A/O 3 (does not know why), NAD  Constitutional;  positive fever, negative diaphoresis, positive rash (venous stasis ulcers bilateral lower extremity)   Eyes: Pupils  equal round reactive to light and accommodation, negative negative retinal hemorrhage   ENT: Negative Runny nose, negative sinus pain, negative (tinnitus), negative (odynophagia)  Gastrointestinal;  morbid obesity negative abdominal pain,Soft, nontender nondistended plus bowel sound   Neck:  Negative scars, masses, torticollis, lymphadenopathy, JVD  Cardiovascular: RRR, negative murmurs, rubs, gallops normal S1/S2, negative S3  Respiratory: Clear to auscultation bilateral, negative rales, rhonchi, negative wheezing, crackles  Skin ;  positive bilateral lower extremity venous stasis rashes, negative acanthosis nigricans,  Musculoskeletal: negative lower extremity pain, positive bilateral lower extremity paresthesia, bilateral lower extremity pedal edema 1+, bilateral DP/PT pulses +2  Psychiatric:  Negative depression /anxiety  Neurologic: Negative headache,  positive bilateral lower extremity (paraesthesiae)/ numbness,  negative extremity weakness  Endocrine; negative signs or symptoms of hypo-/hyperthyroidism   Hemolytic/lymphatic; within normal limit   Allergic/immunologic;  negative Swelling or pain at groin(s), axilla(e) or neck (swollen lymph  nodes/glands),       Labs on Admission:  Basic Metabolic Panel:  Recent Labs Lab 10/14/14 2128  NA 138  K 3.9  CL 101  CO2 28  GLUCOSE 138*  BUN 18  CREATININE 1.33*  CALCIUM 9.1   Liver Function Tests:  Recent Labs Lab 10/14/14 2128  AST 22  ALT 20  ALKPHOS 79  BILITOT 0.9  PROT 7.1  ALBUMIN 3.6   No results for input(s): LIPASE, AMYLASE in the last 168 hours. No results for input(s): AMMONIA in the last 168 hours. CBC:  Recent Labs Lab 10/14/14 2128  WBC 12.9*  NEUTROABS 11.2*  HGB 14.3  HCT 43.9  MCV 89.6  PLT 219   Cardiac Enzymes:  Recent Labs Lab 10/14/14 2128  TROPONINI <0.03    BNP (last 3 results) No results for input(s): BNP in the last 8760 hours.  ProBNP (last 3 results) No results for input(s): PROBNP in the last 8760 hours.  CBG: No results for input(s): GLUCAP in the last 168 hours.  Radiological Exams on Admission: Dg Chest 2 View  10/14/2014   CLINICAL DATA:  Acute onset of fever.  Initial encounter.  EXAM: CHEST  2 VIEW  COMPARISON:  Chest radiograph performed 08/31/2014  FINDINGS: The lungs are well-aerated. Vascular congestion is noted. Mildly increased interstitial markings raise concern for mild interstitial edema. No definite pleural effusion or pneumothorax is seen.  The heart is borderline normal in size. No acute osseous abnormalities are seen.  IMPRESSION: Vascular congestion noted. Mildly increased interstitial markings raise concern for mild interstitial edema.   Electronically Signed   By: Roanna RaiderJeffery  Chang M.D.   On: 10/14/2014 22:31    EKG: Compared to EKG 09/18/2013 no significant changes.   Assessment/Plan Principal Problem:   Sepsis secondary to UTI Active Problems:   Chronic venous insufficiency   COPD (chronic obstructive pulmonary disease)   AAA (abdominal aortic aneurysm) without rupture   Cognitive impairment   Dementia   Anticoagulation goal of INR 2 to 3   Left leg DVT   B12 deficiency   Atrial  fibrillation with RVR   DVT (deep venous thrombosis)   UTI (urinary tract infection)   Cardiomyopathy   Sepsis secondary to UTI -Continue ceftriaxone per urosepsis protocol -Urine cultures, blood cultures pending -Recheck lactic acid in the a.m. -Normal saline 7075ml/hr  Dilated Cardiomyopathy -Patient currently appears compensated but will obtain echocardiogram -Strict in and out -Daily weight  Atrial fibrillation with RVR -Currently in NSR -Continue Cardizem 120 mg daily   AAA without rupture -03/19/2014 Stable 4.4 cm infrarenal abdominal  aortic aneurysm by CT angiogram; overdue for ultrasound for AAA evaluation; abdominal ultrasound ordered  COPD -Stable patient on no home medications, titrate O2 to maintain SPO2 8993%  Dementia without behavioral disturbance -Currently on no home medication; no family present so unsure of baseline  Venous stasis ulcers -Wound care consult placed      Code Status: Full code Family Communication:  None Disposition Plan:  Resolution urosepsis   Time spent:  75 minutes   Amari Burnsworth, Roselind MessierCURTIS J [facility] Pager [phone]  If 7PM-7AM, please contact night-coverage [email or web address] Password [email or web address] 10/15/2014, 2:28 AM

## 2014-10-15 ENCOUNTER — Inpatient Hospital Stay (HOSPITAL_COMMUNITY): Payer: Medicare Other

## 2014-10-15 DIAGNOSIS — I509 Heart failure, unspecified: Secondary | ICD-10-CM

## 2014-10-15 DIAGNOSIS — J438 Other emphysema: Secondary | ICD-10-CM | POA: Insufficient documentation

## 2014-10-15 DIAGNOSIS — I429 Cardiomyopathy, unspecified: Secondary | ICD-10-CM

## 2014-10-15 DIAGNOSIS — I83009 Varicose veins of unspecified lower extremity with ulcer of unspecified site: Secondary | ICD-10-CM | POA: Insufficient documentation

## 2014-10-15 DIAGNOSIS — N390 Urinary tract infection, site not specified: Secondary | ICD-10-CM

## 2014-10-15 DIAGNOSIS — I420 Dilated cardiomyopathy: Secondary | ICD-10-CM | POA: Insufficient documentation

## 2014-10-15 DIAGNOSIS — L97909 Non-pressure chronic ulcer of unspecified part of unspecified lower leg with unspecified severity: Secondary | ICD-10-CM | POA: Insufficient documentation

## 2014-10-15 DIAGNOSIS — A419 Sepsis, unspecified organism: Secondary | ICD-10-CM | POA: Diagnosis present

## 2014-10-15 HISTORY — DX: Cardiomyopathy, unspecified: I42.9

## 2014-10-15 LAB — CBC
HCT: 37.7 % — ABNORMAL LOW (ref 39.0–52.0)
HEMOGLOBIN: 12.4 g/dL — AB (ref 13.0–17.0)
MCH: 29.3 pg (ref 26.0–34.0)
MCHC: 32.9 g/dL (ref 30.0–36.0)
MCV: 89.1 fL (ref 78.0–100.0)
Platelets: 207 10*3/uL (ref 150–400)
RBC: 4.23 MIL/uL (ref 4.22–5.81)
RDW: 15.5 % (ref 11.5–15.5)
WBC: 14.5 10*3/uL — ABNORMAL HIGH (ref 4.0–10.5)

## 2014-10-15 LAB — URINALYSIS, ROUTINE W REFLEX MICROSCOPIC
Bilirubin Urine: NEGATIVE
GLUCOSE, UA: NEGATIVE mg/dL
Ketones, ur: NEGATIVE mg/dL
NITRITE: POSITIVE — AB
PROTEIN: NEGATIVE mg/dL
Specific Gravity, Urine: 1.021 (ref 1.005–1.030)
UROBILINOGEN UA: 4 mg/dL — AB (ref 0.0–1.0)
pH: 6 (ref 5.0–8.0)

## 2014-10-15 LAB — COMPREHENSIVE METABOLIC PANEL
ALBUMIN: 3.2 g/dL — AB (ref 3.5–5.0)
ALK PHOS: 72 U/L (ref 38–126)
ALT: 18 U/L (ref 17–63)
AST: 20 U/L (ref 15–41)
Anion gap: 8 (ref 5–15)
BUN: 17 mg/dL (ref 6–20)
CO2: 27 mmol/L (ref 22–32)
Calcium: 8.8 mg/dL — ABNORMAL LOW (ref 8.9–10.3)
Chloride: 104 mmol/L (ref 101–111)
Creatinine, Ser: 1.35 mg/dL — ABNORMAL HIGH (ref 0.61–1.24)
GFR calc Af Amer: 55 mL/min — ABNORMAL LOW (ref >60)
GFR calc non Af Amer: 48 mL/min — ABNORMAL LOW (ref >60)
Glucose, Bld: 123 mg/dL — ABNORMAL HIGH (ref 65–99)
POTASSIUM: 3.8 mmol/L (ref 3.5–5.1)
Sodium: 139 mmol/L (ref 135–145)
TOTAL PROTEIN: 6.1 g/dL — AB (ref 6.5–8.1)
Total Bilirubin: 1.3 mg/dL — ABNORMAL HIGH (ref 0.3–1.2)

## 2014-10-15 LAB — URINE MICROSCOPIC-ADD ON

## 2014-10-15 LAB — PROTIME-INR
INR: 1.6 — ABNORMAL HIGH (ref 0.00–1.49)
Prothrombin Time: 19.2 seconds — ABNORMAL HIGH (ref 11.6–15.2)

## 2014-10-15 LAB — I-STAT CG4 LACTIC ACID, ED: Lactic Acid, Venous: 0.76 mmol/L (ref 0.5–2.0)

## 2014-10-15 LAB — LACTIC ACID, PLASMA
LACTIC ACID, VENOUS: 1 mmol/L (ref 0.5–2.0)
LACTIC ACID, VENOUS: 1.2 mmol/L (ref 0.5–2.0)

## 2014-10-15 LAB — APTT: aPTT: 35 seconds (ref 24–37)

## 2014-10-15 LAB — FIBRINOGEN: FIBRINOGEN: 508 mg/dL — AB (ref 204–475)

## 2014-10-15 LAB — CORTISOL: CORTISOL PLASMA: 8.4 ug/dL

## 2014-10-15 LAB — TROPONIN I: Troponin I: <0.03 ng/mL (ref <0.031)

## 2014-10-15 MED ORDER — SODIUM CHLORIDE 0.9 % IV SOLN
INTRAVENOUS | Status: DC
  Administered 2014-10-15 – 2014-10-16 (×2): via INTRAVENOUS

## 2014-10-15 MED ORDER — WARFARIN SODIUM 7.5 MG PO TABS
7.5000 mg | ORAL_TABLET | ORAL | Status: DC
  Administered 2014-10-16: 7.5 mg via ORAL
  Filled 2014-10-15: qty 1

## 2014-10-15 MED ORDER — ACETAMINOPHEN 325 MG PO TABS
650.0000 mg | ORAL_TABLET | Freq: Four times a day (QID) | ORAL | Status: DC | PRN
  Administered 2014-10-15 – 2014-10-17 (×2): 650 mg via ORAL
  Filled 2014-10-15 (×2): qty 2

## 2014-10-15 MED ORDER — WARFARIN SODIUM 5 MG PO TABS: 5.0000 mg | ORAL_TABLET | Freq: Every day | ORAL | Status: DC

## 2014-10-15 MED ORDER — CEFTRIAXONE SODIUM IN DEXTROSE 40 MG/ML IV SOLN
2.0000 g | INTRAVENOUS | Status: DC
  Administered 2014-10-15 – 2014-10-16 (×2): 2 g via INTRAVENOUS
  Filled 2014-10-15 (×3): qty 50

## 2014-10-15 MED ORDER — WARFARIN - PHYSICIAN DOSING INPATIENT
Freq: Every day | Status: DC
  Administered 2014-10-16: 17:00:00

## 2014-10-15 MED ORDER — WARFARIN SODIUM 5 MG PO TABS
5.0000 mg | ORAL_TABLET | ORAL | Status: DC
  Administered 2014-10-15: 5 mg via ORAL
  Filled 2014-10-15 (×2): qty 1

## 2014-10-15 MED ORDER — LATANOPROST 0.005 % OP SOLN
1.0000 [drp] | Freq: Every day | OPHTHALMIC | Status: DC
  Administered 2014-10-15 – 2014-10-16 (×3): 1 [drp] via OPHTHALMIC
  Filled 2014-10-15: qty 2.5

## 2014-10-15 MED ORDER — FINASTERIDE 5 MG PO TABS
5.0000 mg | ORAL_TABLET | Freq: Every day | ORAL | Status: DC
  Administered 2014-10-15 – 2014-10-16 (×2): 5 mg via ORAL
  Filled 2014-10-15 (×3): qty 1

## 2014-10-15 MED ORDER — DILTIAZEM HCL ER COATED BEADS 120 MG PO CP24
120.0000 mg | ORAL_CAPSULE | Freq: Every day | ORAL | Status: DC
  Administered 2014-10-15 – 2014-10-17 (×3): 120 mg via ORAL
  Filled 2014-10-15 (×3): qty 1

## 2014-10-15 MED ORDER — HYDROCODONE-ACETAMINOPHEN 10-325 MG PO TABS: 1.0000 | ORAL_TABLET | Freq: Four times a day (QID) | ORAL | Status: DC | PRN

## 2014-10-15 MED ORDER — DOXAZOSIN MESYLATE 4 MG PO TABS
4.0000 mg | ORAL_TABLET | Freq: Every day | ORAL | Status: DC
  Administered 2014-10-15 – 2014-10-16 (×2): 4 mg via ORAL
  Filled 2014-10-15 (×3): qty 1

## 2014-10-15 MED ORDER — CEFTRIAXONE SODIUM IN DEXTROSE 40 MG/ML IV SOLN
2.0000 g | Freq: Once | INTRAVENOUS | Status: AC
  Administered 2014-10-15: 2 g via INTRAVENOUS
  Filled 2014-10-15: qty 50

## 2014-10-15 NOTE — Progress Notes (Signed)
Orthopedic Tech Progress Note Patient Details:  [name] Oct 29, 1931 [id number] Unna boots bilateral Ortho Devices Ortho Device/Splint Location: Bilateral Ortho Device/Splint Interventions: Application   [name] 10/15/2014, 4:04 PM

## 2014-10-15 NOTE — Progress Notes (Signed)
Orthopedic Tech Progress Note Patient Details:  [name] 09/28/1931 [id number] Patient seated in chair when tech went to apply unna boots. Will come back once patient is back in bed.  Patient ID: [name], male   DOB: 09/28/1931, 81 y.o.   MRN: [id number]   [name] 10/15/2014, 1:32 PM

## 2014-10-15 NOTE — Progress Notes (Signed)
PATIENT ARRIVED TO TELE UNIT FROM E.D.  VIA STRETCHER. TRANSFERRED TO BED BY STAFF. BED WEIGHT AND VITALS OBTAINED. TELE APPLIED. ASSESSMENT PERFORMED.  PATIENT INCONTINENT AT TIMES. EXTERNAL CATHETER APPLIED.  DAUGHTER AND WIFE AT THE BEDSIDE. PATIENT EDUCATED ON USE OF CALL BELL.

## 2014-10-15 NOTE — Consult Note (Addendum)
WOC wound consult note Reason for Consult: Consult requested to assess bilat legs.  Pt states he wears compression wraps prior to admission which are changed Q week.  These have already been removed previously. Wound type: Bilat calves with red patchy areas of partial thickness wounds and venous stasis changes and generalized edema.   Measurement: No significant open wounds to measure Wound bed: Red and moist Drainage (amount, consistency, odor) No odor, small amt yellow drainage Dressing procedure/placement/frequency: Paged ortho tech to apply bilat Una boots and Coban and change Q Monday while in the hospital.  Pt can [facility]. Discussed plan of care with patient and he verbalizes understanding. Please re-consult if further assistance is needed.  Thank-you,  Cammie Mcgeeawn Herny Scurlock MSN, RN, CWOCN, [place]CN-AP, CNS [phone]

## 2014-10-15 NOTE — Care Management (Signed)
Medicare Important Message given? Yes (If Response is "NO", the following Medicare IM given date fields will be blank) Date Medicare IM given: 10/15/15 Medicare IM given by: Raynald Blendlaxton, Ferron Ishmael

## 2014-10-15 NOTE — ED Notes (Signed)
Attempted report 

## 2014-10-15 NOTE — Progress Notes (Signed)
Utilization review completed. Thurston Brendlinger, RN, BSN. 

## 2014-10-15 NOTE — Progress Notes (Signed)
[facility] PROGRESS NOTE  [name] [id number] DOB: Dec 08, 1931 DOA: 10/14/2014 PCP: [name]  Assessment/Plan: Principal Problem:   Sepsis secondary to UTI Active Problems:   Chronic venous insufficiency   COPD (chronic obstructive pulmonary disease)   AAA (abdominal aortic aneurysm) without rupture   Cognitive impairment   Dementia   Anticoagulation goal of INR 2 to 3   Left leg DVT   B12 deficiency   Atrial fibrillation with RVR   DVT (deep venous thrombosis)   UTI (urinary tract infection)   Cardiomyopathy   Dilated cardiomyopathy   Atrial fibrillation with RVR   Other emphysema   Venous stasis ulcer    Sepsis secondary to UTI -Continue ceftriaxone , follow urine culture -Recheck lactic acid in the a.m. -Normal saline 2475ml/hr  Dilated Cardiomyopathy -Patient currently appears compensated , repeat echocardiogram echocardiogram -Strict in and out -Daily weight  Atrial fibrillation with RVR -Currently in NSR -Continue Cardizem 120 mg daily  AAA without rupture -03/19/2014 Stable 4.4 cm infrarenal abdominal aortic aneurysm by CT angiogram; overdue for ultrasound for AAA evaluation; abdominal ultrasound ordered Some increase  in size of lower abdominal aortic aneurysm compared to previous ultrasound Followed by vascular surgery outpatient  COPD -Stable patient on no home medications, titrate O2 to maintain SPO2 8993%  Dementia without behavioral disturbance -Currently on no home medication; no family present so unsure of baseline  Venous stasis ulcers -Wound care consult placed      Code Status: DO NOT RESUSCITATE Family Communication: Called patient's son Joel NixonJanice and discussed Disposition Plan:  As above    Brief narrative: 81 y.o. WM PMHx cognitive impairment, dementia, left leg DVT on anticoagulation, atrial fibrillation with RVR, Cardiomyopathy AAA, venous insufficiency, atrial fibrillation, COPD,  falls.  Fever Associated symptoms: confusion and cough  patient presents with fever and generalized weakness. Reportedly had been getting up at home and became weak in his legs and had to sit down. No injury from that. Has had fevers reportedly up to 101.7 at home. Has had a little bit of cough. Minimal sputum production. No diarrhea. No nausea vomiting. He does have some chronic venous wounds on his legs that have reprtedly been doing well. He has some dressings on them.  Consultants:  None  Procedures:  None  Antibiotics: Rocephin  HPI/Subjective: Complaining of a headache, low-grade fever  Objective: Filed Vitals:   10/15/14 0105 10/15/14 0129 10/15/14 0436 10/15/14 1019  BP:  118/70 120/61 115/51  Pulse:  91 77 70  Temp: 99.9 F (37.7 C) 100.6 F (38.1 C) 98.9 F (37.2 C)   TempSrc: Oral Oral Oral   Resp:  22 20   Height:  6\' 7"  (2.007 m)    Weight:  132.2 kg (291 lb 7.2 oz) 132.2 kg (291 lb 7.2 oz)   SpO2:  99% 98%     Intake/Output Summary (Last 24 hours) at 10/15/14 1126 Last data filed at 10/15/14 0359  Gross per 24 hour  Intake      0 ml  Output    100 ml  Net   -100 ml    Exam:  General: No acute respiratory distress Lungs: Clear to auscultation bilaterally without wheezes or crackles Cardiovascular: Regular rate and rhythm without murmur gallop or rub normal S1 and S2 Abdomen: Nontender, nondistended, soft, bowel sounds positive, no rebound, no ascites, no appreciable mass Extremities: No significant cyanosis, clubbing, or edema bilateral lower extremities Wound type: Bilat calves with red patchy areas of partial thickness  wounds and venous stasis changes and generalized edema     Data Reviewed: Basic Metabolic Panel:  Recent Labs Lab 10/14/14 2128 10/15/14 0331  NA 138 139  K 3.9 3.8  CL 101 104  CO2 28 27  GLUCOSE 138* 123*  BUN 18 17  CREATININE 1.33* 1.35*  CALCIUM 9.1 8.8*    Liver Function Tests:  Recent Labs Lab  10/14/14 2128 10/15/14 0331  AST 22 20  ALT 20 18  ALKPHOS 79 72  BILITOT 0.9 1.3*  PROT 7.1 6.1*  ALBUMIN 3.6 3.2*   No results for input(s): LIPASE, AMYLASE in the last 168 hours. No results for input(s): AMMONIA in the last 168 hours.  CBC:  Recent Labs Lab 10/14/14 2128 10/15/14 0331  WBC 12.9* 14.5*  NEUTROABS 11.2*  --   HGB 14.3 12.4*  HCT 43.9 37.7*  MCV 89.6 89.1  PLT 219 207    Cardiac Enzymes:  Recent Labs Lab 10/14/14 2128 10/15/14 0331  TROPONINI <0.03 <0.03   BNP (last 3 results) No results for input(s): BNP in the last 8760 hours.  ProBNP (last 3 results) No results for input(s): PROBNP in the last 8760 hours.    CBG: No results for input(s): GLUCAP in the last 168 hours.  No results found for this or any previous visit (from the past 240 hour(s)).   Studies: Dg Chest 2 View  10/14/2014   CLINICAL DATA:  Acute onset of fever.  Initial encounter.  EXAM: CHEST  2 VIEW  COMPARISON:  Chest radiograph performed 08/31/2014  FINDINGS: The lungs are well-aerated. Vascular congestion is noted. Mildly increased interstitial markings raise concern for mild interstitial edema. No definite pleural effusion or pneumothorax is seen.  The heart is borderline normal in size. No acute osseous abnormalities are seen.  IMPRESSION: Vascular congestion noted. Mildly increased interstitial markings raise concern for mild interstitial edema.   Electronically Signed   By: Roanna RaiderJeffery  Chang M.D.   On: 10/14/2014 22:31   [place] Aorta  10/15/2014   CLINICAL DATA:  81 year old male with history of of abdominal aortic aneurysm. Subsequent encounter.  EXAM: ULTRASOUND OF ABDOMINAL AORTA  TECHNIQUE: Ultrasound examination of the abdominal aorta was performed to evaluate for abdominal aortic aneurysm.  COMPARISON:  03/19/2014.  FINDINGS: Abdominal Aorta  Lower abdominal aortic aneurysm with maximal transverse dimension of 4.5 x 4.6 cm and spanning over 5.6 cm. Previously, this measured  4.3 x 3.8 cm spanning over 5.6 cm.  Ectatic common iliac arteries with maximal AP dimension 1.5 cm  IMPRESSION: Suggestion of increase in size of lower abdominal aortic aneurysm as noted above. Given the fact that apparent increase in size may be partially explained by differences in technique (ultrasound versus CT), follow-up CT at the present time is recommended. If the patient is not able to receive intravenous contrast, unenhanced CT of the abdomen and pelvis may then be obtained.   Electronically Signed   By: Lacy DuverneySteven  Olson M.D.   On: 10/15/2014 07:49    Scheduled Meds: . cefTRIAXone (ROCEPHIN)  IV  2 g Intravenous Q24H  . diltiazem  120 mg Oral Daily  . doxazosin  4 mg Oral QHS  . finasteride  5 mg Oral QHS  . latanoprost  1 drop Both Eyes QHS  . [START ON 10/16/2014] warfarin  7.5 mg Oral Q T,Th,S,Su-1800   And  . warfarin  5 mg Oral Q M,W,F-1800  . Warfarin - Physician Dosing Inpatient   Does not apply q1800   Continuous  Infusions: . sodium chloride 75 mL/hr at 10/15/14 0359    Principal Problem:   Sepsis secondary to UTI Active Problems:   Chronic venous insufficiency   COPD (chronic obstructive pulmonary disease)   AAA (abdominal aortic aneurysm) without rupture   Cognitive impairment   Dementia   Anticoagulation goal of INR 2 to 3   Left leg DVT   B12 deficiency   Atrial fibrillation with RVR   DVT (deep venous thrombosis)   UTI (urinary tract infection)   Cardiomyopathy   Dilated cardiomyopathy   Atrial fibrillation with RVR   Other emphysema   Venous stasis ulcer    Time spent: 40 minutes   [facility]  [facility] Pager [phone]. If 7PM-7AM, please contact night-coverage at [email or web address], password [facility] 10/15/2014, 11:26 AM  LOS: 1 day

## 2014-10-15 NOTE — Progress Notes (Signed)
10/15/2014 1030 Nursing note Received call from pt. Daughter, Otelia SanteeJoyce [name], states pt. Is DNR outside of facility and would like to uphold patient's wishes while inpatient. Dr. Susie CassetteAbrol updated on this information and given pt. Daughter's phone number to contact her for more information. Will continue to monitor patient.  Tres Grzywacz, Blanchard KelchStephanie Ingold

## 2014-10-15 NOTE — Progress Notes (Signed)
10/15/2014 1130 Ortho services contacted to place bilateral una boot and coban wrap per orders. Will continue to monitor patient.  Jamea Robicheaux, Blanchard KelchStephanie Ingold

## 2014-10-15 NOTE — Progress Notes (Signed)
  Echocardiogram 2D Echocardiogram has been performed.  Janalyn HarderWest, Elise Knobloch R 10/15/2014, 10:18 AM

## 2014-10-16 LAB — CBC
HCT: 37.8 % — ABNORMAL LOW (ref 39.0–52.0)
HEMOGLOBIN: 12.1 g/dL — AB (ref 13.0–17.0)
MCH: 28.9 pg (ref 26.0–34.0)
MCHC: 32 g/dL (ref 30.0–36.0)
MCV: 90.2 fL (ref 78.0–100.0)
Platelets: 180 10*3/uL (ref 150–400)
RBC: 4.19 MIL/uL — ABNORMAL LOW (ref 4.22–5.81)
RDW: 16 % — AB (ref 11.5–15.5)
WBC: 7.8 10*3/uL (ref 4.0–10.5)

## 2014-10-16 LAB — COMPREHENSIVE METABOLIC PANEL
ALT: 15 U/L — ABNORMAL LOW (ref 17–63)
AST: 14 U/L — ABNORMAL LOW (ref 15–41)
Albumin: 2.9 g/dL — ABNORMAL LOW (ref 3.5–5.0)
Alkaline Phosphatase: 62 U/L (ref 38–126)
Anion gap: 7 (ref 5–15)
BUN: 18 mg/dL (ref 6–20)
CALCIUM: 8.6 mg/dL — AB (ref 8.9–10.3)
CO2: 26 mmol/L (ref 22–32)
Chloride: 109 mmol/L (ref 101–111)
Creatinine, Ser: 1.16 mg/dL (ref 0.61–1.24)
GFR calc non Af Amer: 57 mL/min — ABNORMAL LOW (ref >60)
GLUCOSE: 107 mg/dL — AB (ref 65–99)
Potassium: 3.9 mmol/L (ref 3.5–5.1)
Sodium: 142 mmol/L (ref 135–145)
TOTAL PROTEIN: 6 g/dL — AB (ref 6.5–8.1)
Total Bilirubin: 0.5 mg/dL (ref 0.3–1.2)

## 2014-10-16 LAB — PROTIME-INR
INR: 1.44 (ref 0.00–1.49)
PROTHROMBIN TIME: 17.7 s — AB (ref 11.6–15.2)

## 2014-10-16 MED ORDER — ENOXAPARIN SODIUM 150 MG/ML ~~LOC~~ SOLN
150.0000 mg | Freq: Two times a day (BID) | SUBCUTANEOUS | Status: DC
  Administered 2014-10-16 – 2014-10-17 (×2): 150 mg via SUBCUTANEOUS
  Filled 2014-10-16 (×5): qty 1

## 2014-10-16 NOTE — Progress Notes (Addendum)
[facility] PROGRESS NOTE  [name] NFA:[id number] DOB: [birth year] DOA: 10/14/2014 PCP: Joel KusterMILLER, STEPHEN M, MD  Assessment/Plan: Principal Problem:   Sepsis secondary to UTI Active Problems:   Chronic venous insufficiency   COPD (chronic obstructive pulmonary disease)   AAA (abdominal aortic aneurysm) without rupture   Cognitive impairment   Dementia   Anticoagulation goal of INR 2 to 3   Left leg DVT   B12 deficiency   Atrial fibrillation with RVR   DVT (deep venous thrombosis)   UTI (urinary tract infection)   Cardiomyopathy   Dilated cardiomyopathy   Atrial fibrillation with RVR   Other emphysema   Venous stasis ulcer    Probable Sepsis secondary to UTI -Continue ceftriaxone , follow urine culture, blood culture Hemodynamically stable, stop IV hydration as the patient appears to be euvolemic and chest x-ray shows congestion [facility], if culture remains negative, for 7 days  Dilated Cardiomyopathy -Patient currently appears compensated , repeat echo shows an EF of 55-60% , diastolic dysfunction -Strict in and out -Daily weight  Atrial fibrillation with RVR -Currently in NSR, INR subtherapeutic at 1.44, start the patient on Lovenox to bridge -Continue Cardizem 120 mg daily  AAA without rupture -03/19/2014 Stable 4.4 cm infrarenal abdominal aortic aneurysm by CT angiogram; overdue for ultrasound for AAA evaluation; abdominal ultrasound ordered Some increase  in size of lower abdominal aortic aneurysm compared to previous ultrasound Followed by vascular surgery outpatient  COPD -Stable patient on no home medications, titrate O2 to maintain SPO2 89-93%  Dementia without behavioral disturbance -Currently on no home medication; no family present so unsure of baseline  Venous stasis ulcers -Wound care consult placed   Code Status: DO NOT RESUSCITATE Family Communication: Called patient's son [name] and discussed Disposition Plan:  PT/OT  evaluation pending, anticipate discharge 5/18   Brief narrative: 81 y.o. WM PMHx cognitive impairment, dementia, left leg DVT on anticoagulation, atrial fibrillation with RVR, Cardiomyopathy AAA, venous insufficiency, atrial fibrillation, COPD, falls.  Fever Associated symptoms: confusion and cough  patient presents with fever and generalized weakness. Reportedly had been getting up at home and became weak in his legs and had to sit down. No injury from that. Has had fevers reportedly up to 101.7 at home. Has had a little bit of cough. Minimal sputum production. No diarrhea. No nausea vomiting. He does have some chronic venous wounds on his legs that have reprtedly been doing well. He has some dressings on them.  Consultants:  None  Procedures:  None  Antibiotics: Rocephin  HPI/Subjective: Patient is a 2 person assist, complains of generalized weakness, afebrile overnight  Objective: Filed Vitals:   10/15/14 1019 10/15/14 1700 10/15/14 2028 10/16/14 0442  BP: 115/51 119/53 122/51 124/50  Pulse: 70 62 68 66  Temp:   98.6 F (37 C) 98.3 F (36.8 C)  TempSrc:   Oral Oral  Resp:  20 20 20   Height:      Weight:    147.6 kg (325 lb 6.4 oz)  SpO2:  97% 97% 97%    Intake/Output Summary (Last 24 hours) at 10/16/14 1002 Last data filed at 10/16/14 0540  Gross per 24 hour  Intake 2061.25 ml  Output    826 ml  Net 1235.25 ml    Exam:  General: No acute respiratory distress Lungs: Clear to auscultation bilaterally without wheezes or crackles Cardiovascular: Regular rate and rhythm without murmur gallop or rub normal S1 and S2 Abdomen: Nontender, nondistended, soft, bowel sounds positive,  no rebound, no ascites, no appreciable mass Extremities: No significant cyanosis, clubbing, or edema bilateral lower extremities Wound type: Bilat calves with red patchy areas of partial thickness wounds and venous stasis changes and generalized edema     Data Reviewed: Basic  Metabolic Panel:  Recent Labs Lab 10/14/14 2128 10/15/14 0331 10/16/14 0325  NA 138 139 142  K 3.9 3.8 3.9  CL 101 104 109  CO2 28 27 26   GLUCOSE 138* 123* 107*  BUN 18 17 18   CREATININE 1.33* 1.35* 1.16  CALCIUM 9.1 8.8* 8.6*    Liver Function Tests:  Recent Labs Lab 10/14/14 2128 10/15/14 0331 10/16/14 0325  AST 22 20 14*  ALT 20 18 15*  ALKPHOS 79 72 62  BILITOT 0.9 1.3* 0.5  PROT 7.1 6.1* 6.0*  ALBUMIN 3.6 3.2* 2.9*   No results for input(s): LIPASE, AMYLASE in the last 168 hours. No results for input(s): AMMONIA in the last 168 hours.  CBC:  Recent Labs Lab 10/14/14 2128 10/15/14 0331 10/16/14 0325  WBC 12.9* 14.5* 7.8  NEUTROABS 11.2*  --   --   HGB 14.3 12.4* 12.1*  HCT 43.9 37.7* 37.8*  MCV 89.6 89.1 90.2  PLT 219 207 180    Cardiac Enzymes:  Recent Labs Lab 10/14/14 2128 10/15/14 0331  TROPONINI <0.03 <0.03   BNP (last 3 results) No results for input(s): BNP in the last 8760 hours.  ProBNP (last 3 results) No results for input(s): PROBNP in the last 8760 hours.    CBG: No results for input(s): GLUCAP in the last 168 hours.  Recent Results (from the past 240 hour(s))  Culture, blood (x 2)     Status: None (Preliminary result)   Collection Time: 10/15/14  3:31 AM  Result Value Ref Range Status   Specimen Description BLOOD LEFT HAND  Final   Special Requests BOTTLES DRAWN AEROBIC AND ANAEROBIC 5CC  Final   Culture   Final           BLOOD CULTURE RECEIVED NO GROWTH TO DATE CULTURE WILL BE HELD FOR 5 DAYS BEFORE ISSUING A FINAL NEGATIVE REPORT [facility]formed [facility]    Report Status PENDING  Incomplete  Culture, blood (x 2)     Status: None (Preliminary result)   Collection Time: 10/15/14  3:39 AM  Result Value Ref Range Status   Specimen Description BLOOD LEFT HAND  Final   Special Requests BOTTLES DRAWN AEROBIC AND ANAEROBIC 5CC  Final   Culture   Final           BLOOD CULTURE RECEIVED NO GROWTH TO DATE CULTURE WILL  BE HELD FOR 5 DAYS BEFORE ISSUING A FINAL NEGATIVE REPORT Performed at [facility]    Report Status PENDING  Incomplete  Urine culture     Status: None (Preliminary result)   Collection Time: 10/15/14  3:57 AM  Result Value Ref Range Status   Specimen Description URINE, RANDOM  Final   Special Requests NONE  Final   Colony Count   Final    >=100,000 COLONIES/ML Performed at [facility]    Culture   Final    GRAM NEGATIVE RODS Performed at [facility]    Report Status PENDING  Incomplete     Studies: Dg Chest 2 View  10/14/2014   CLINICAL DATA:  Acute onset of fever.  Initial encounter.  EXAM: CHEST  2 VIEW  COMPARISON:  Chest radiograph performed 08/31/2014  FINDINGS: The lungs are  well-aerated. Vascular congestion is noted. Mildly increased interstitial markings raise concern for mild interstitial edema. No definite pleural effusion or pneumothorax is seen.  The heart is borderline normal in size. No acute osseous abnormalities are seen.  IMPRESSION: Vascular congestion noted. Mildly increased interstitial markings raise concern for mild interstitial edema.   Electronically Signed   By: Roanna Raider Mays.D.   On: 10/14/2014 22:31   US Aorta  10/15/2014   CLINICAL DATA:  81 year old male with history of of abdominal aortic aneurysm. Subsequent encounter.  EXAM: ULTRASOUND OF ABDOMINAL AORTA  TECHNIQUE: Ultrasound examination of the abdominal aorta was performed to evaluate for abdominal aortic aneurysm.  COMPARISON:  03/19/2014.  FINDINGS: Abdominal Aorta  Lower abdominal aortic aneurysm with maximal transverse dimension of 4.5 x 4.6 cm and spanning over 5.6 cm. Previously, this measured 4.3 x 3.8 cm spanning over 5.6 cm.  Ectatic common iliac arteries with maximal AP dimension 1.5 cm  IMPRESSION: Suggestion of increase in size of lower abdominal aortic aneurysm as noted above. Given the fact that apparent increase in size may be partially explained by differences  in technique (ultrasound versus CT), follow-up CT at the present time is recommended. If the patient is not able to receive intravenous contrast, unenhanced CT of the abdomen and pelvis may then be obtained.   Electronically Signed   By: Lacy Duverney Mays.D.   On: 10/15/2014 07:49    Scheduled Meds: . cefTRIAXone (ROCEPHIN)  IV  2 g Intravenous Q24H  . diltiazem  120 mg Oral Daily  . doxazosin  4 mg Oral QHS  . finasteride  5 mg Oral QHS  . latanoprost  1 drop Both Eyes QHS  . warfarin  7.5 mg Oral Q T,Th,S,Su-1800   And  . warfarin  5 mg Oral Q Mays,W,F-1800  . Warfarin - Physician Dosing Inpatient   Does not apply q1800   Continuous Infusions: . sodium chloride 75 mL/hr at 10/16/14 0500    Principal Problem:   Sepsis secondary to UTI Active Problems:   Chronic venous insufficiency   COPD (chronic obstructive pulmonary disease)   AAA (abdominal aortic aneurysm) without rupture   Cognitive impairment   Dementia   Anticoagulation goal of INR 2 to 3   Left leg DVT   B12 deficiency   Atrial fibrillation with RVR   DVT (deep venous thrombosis)   UTI (urinary tract infection)   Cardiomyopathy   Dilated cardiomyopathy   Atrial fibrillation with RVR   Other emphysema   Venous stasis ulcer    Time spent: 40 minutes   [facility]  [facility] Pager [phone]. If 7PM-7AM, please contact night-coverage at [email or web address], password [facility] 10/16/2014, 10:02 AM  LOS: 2 days

## 2014-10-16 NOTE — Evaluation (Signed)
Physical Therapy Evaluation Patient Details Name: [name] MRN: [id number] DOB: 10-10-1931 Today's Date: 10/16/2014   History of Present Illness  Pt is an 81 y/o male with a history of dementia, DVT, and afib. Pt was admitted on 5/15 for urosepsis.   Clinical Impression  Pt admitted with above diagnosis. Pt currently with functional limitations due to the deficits listed below (see PT Problem List). At the time of PT eval pt was able to perform transfers with min assist to stand. Pt 6'6 and required bed height be elevated. Pt states he is close to baseline of function, and walks minimally at home. Spends most of his time in the wheelchair. Pt will benefit from skilled PT to increase their independence and safety with mobility to allow discharge to the venue listed below. Pt does not feel he needs home therapy services at this time, however if he was agreeable before d/c, feel it could be beneficial.     Follow Up Recommendations Home health PT    Equipment Recommendations  None recommended by PT    Recommendations for Other Services       Precautions / Restrictions Precautions Precautions: Fall Restrictions Weight Bearing Restrictions: No      Mobility  Bed Mobility Overal bed mobility: Needs Assistance Bed Mobility: Supine to Sit     Supine to sit: Supervision     General bed mobility comments: Pt was able to transition to EOB without assistance. Increased time and heavy use of bed rails required.  Transfers Overall transfer level: Needs assistance Equipment used: Rolling walker (2 wheeled) Transfers: Sit to/from [employer]Stand;Stand Pivot Transfers Sit to Stand: Min assist Stand pivot transfers: Min guard       General transfer comment: Pt was able to power up to standing from an elevated bed height with min assist. Increased time to achieve full stand.   Ambulation/Gait             General Gait Details: Further ambulation deferred at this time. Pt is generally at  a transfers only level at home but is able to ambulate short distances if needed.   Stairs            Wheelchair Mobility    Modified Rankin (Stroke Patients Only)       Balance Overall balance assessment: Needs assistance Sitting-balance support: Feet supported;No upper extremity supported Sitting balance-Leahy Scale: Fair     Standing balance support: Bilateral upper extremity supported Standing balance-Leahy Scale: Poor Standing balance comment: Pt requires UE support to maintain standing balance.                              Pertinent Vitals/Pain Pain Assessment: 0-10 Pain Score: 5  Pain Location: Back side/sacrum Pain Intervention(s): Limited activity within patient's tolerance;Monitored during session;Repositioned    Home Living Family/patient expects to be discharged to:: Private residence Living Arrangements: Spouse/significant other Available Help at Discharge: Family;Available 24 hours/day Type of Home: House Home Access: Ramped entrance     Home Layout: One level;Laundry or work area in [employer]basement Home Equipment: Wheelchair - Economistmanual;Shower seat;Walker - 2 wheels      Prior Function Level of Independence: Independent with assistive device(s)         Comments: Able to transfer himself to/from w/c. Sponge bathes due to wounds on BLE's     Hand Dominance   Dominant Hand: Right    Extremity/Trunk Assessment   Upper Extremity Assessment: Defer to  OT evaluation           Lower Extremity Assessment: Generalized weakness      Cervical / Trunk Assessment: Other exceptions  Communication   Communication: HOH  Cognition Arousal/Alertness: Awake/alert Behavior During Therapy: WFL for tasks assessed/performed Overall Cognitive Status: Within Functional Limits for tasks assessed                      General Comments      Exercises        Assessment/Plan    PT Assessment Patient needs continued PT services  PT  Diagnosis Difficulty walking;Generalized weakness   PT Problem List Decreased strength;Decreased range of motion;Decreased activity tolerance;Decreased balance;Decreased mobility;Decreased knowledge of use of DME;Decreased safety awareness;Decreased knowledge of precautions;Pain  PT Treatment Interventions DME instruction;Gait training;Stair training;Functional mobility training;Therapeutic activities;Therapeutic exercise;Neuromuscular re-education;Patient/family education   PT Goals (Current goals can be found in the Care Plan section) Acute Rehab PT Goals Patient Stated Goal: Return home PT Goal Formulation: With patient Time For Goal Achievement: 10/23/14 Potential to Achieve Goals: Good    Frequency Min 3X/week   Barriers to discharge        Co-evaluation               End of Session Equipment Utilized During Treatment: Gait belt Activity Tolerance: Patient limited by fatigue;Patient limited by pain Patient left: in chair;with chair alarm set;with call bell/phone within reach Nurse Communication: Mobility status         Time: [id number] PT Time Calculation (min) (ACUTE ONLY): 20 min   Charges:   PT Evaluation $Initial PT Evaluation Tier I: 1 Procedure     PT G Codes:        [name], [name] 10/16/2014, 1:21 PM  [name], PT, DPT Acute Rehabilitation Services Pager: [phone]

## 2014-10-16 NOTE — Progress Notes (Signed)
ANTICOAGULATION CONSULT NOTE - Follow Up Consult  Pharmacy Consult for Lovenox  Indication: Afib  Allergies  Allergen Reactions  . Oxycodone Hcl Other (See Comments)     makes pt "wild"  . Propoxyphene N-Acetaminophen Other (See Comments)     Makes pt. "wild"    Patient Measurements: Height: 6\' 7"  (200.7 cm) Weight: (!) 325 lb 6.4 oz (147.6 kg) IBW/kg (Calculated) : 93.7  Vital Signs: Temp: 98.3 F (36.8 C) (05/17 0442) Temp Source: Oral (05/17 0442) BP: 124/50 mmHg (05/17 0442) Pulse Rate: 66 (05/17 0442)  Labs:  Recent Labs  10/14/14 2128 10/15/14 0331 10/16/14 0325  HGB 14.3 12.4* 12.1*  HCT 43.9 37.7* 37.8*  PLT 219 207 180  APTT  --  35  --   LABPROT  --  19.2* 17.7*  INR  --  1.60* 1.44  CREATININE 1.33* 1.35* 1.16  TROPONINI <0.03 <0.03  --     Estimated Creatinine Clearance: 81.4 mL/min (by C-G formula based on Cr of 1.16).   Assessment: [age] YOM with hx of dementia, DVT and afib on couamdin PTA admitted on 5/15 for Urosepsis. She is on Coumadin PTA for afib and hx of DVT,  home dose resumed per MD, INR 1.44 today. Now to start lovenox bridge.  Goal of Therapy:  INR 2-3 Anti-Xa level 0.6-1 units/ml 4hrs after LMWH dose given Monitor platelets by anticoagulation protocol: Yes   Plan:  Lovenox 150mg  sq q 12 hrs. Continue coumadin home dose per MD for now, with coumadin 7.5 mg today. Monitor daily PT/INR  Bayard HuggerMei Vertis Bauder, PharmD, BCPS  Clinical Pharmacist  Pager: [phone]   10/16/2014,10:17 AM

## 2014-10-17 ENCOUNTER — Telehealth: Payer: Self-pay | Admitting: Family Medicine

## 2014-10-17 DIAGNOSIS — Z5181 Encounter for therapeutic drug level monitoring: Secondary | ICD-10-CM

## 2014-10-17 DIAGNOSIS — I4891 Unspecified atrial fibrillation: Secondary | ICD-10-CM

## 2014-10-17 DIAGNOSIS — Z7901 Long term (current) use of anticoagulants: Secondary | ICD-10-CM

## 2014-10-17 DIAGNOSIS — I714 Abdominal aortic aneurysm, without rupture: Secondary | ICD-10-CM

## 2014-10-17 DIAGNOSIS — N390 Urinary tract infection, site not specified: Secondary | ICD-10-CM

## 2014-10-17 DIAGNOSIS — A419 Sepsis, unspecified organism: Principal | ICD-10-CM

## 2014-10-17 LAB — URINE CULTURE: Colony Count: >=100000

## 2014-10-17 LAB — CBC
HCT: 35.9 % — ABNORMAL LOW (ref 39.0–52.0)
HEMOGLOBIN: 11.4 g/dL — AB (ref 13.0–17.0)
MCH: 28.9 pg (ref 26.0–34.0)
MCHC: 31.8 g/dL (ref 30.0–36.0)
MCV: 90.9 fL (ref 78.0–100.0)
Platelets: 194 10*3/uL (ref 150–400)
RBC: 3.95 MIL/uL — ABNORMAL LOW (ref 4.22–5.81)
RDW: 15.9 % — ABNORMAL HIGH (ref 11.5–15.5)
WBC: 6.2 10*3/uL (ref 4.0–10.5)

## 2014-10-17 LAB — PROTIME-INR
INR: 1.38 (ref 0.00–1.49)
PROTHROMBIN TIME: 17.1 s — AB (ref 11.6–15.2)

## 2014-10-17 MED ORDER — FUROSEMIDE 20 MG PO TABS: 60.0000 mg | ORAL_TABLET | Freq: Every day | ORAL | Status: DC

## 2014-10-17 MED ORDER — LEVOFLOXACIN 750 MG PO TABS
750.0000 mg | ORAL_TABLET | Freq: Every day | ORAL | Status: DC
  Administered 2014-10-17: 750 mg via ORAL
  Filled 2014-10-17: qty 1

## 2014-10-17 MED ORDER — WARFARIN SODIUM 5 MG PO TABS: 5.0000 mg | ORAL_TABLET | ORAL | Status: DC

## 2014-10-17 MED ORDER — WARFARIN SODIUM 10 MG PO TABS
10.0000 mg | ORAL_TABLET | Freq: Once | ORAL | Status: DC
  Filled 2014-10-17: qty 1

## 2014-10-17 MED ORDER — WARFARIN SODIUM 7.5 MG PO TABS: 7.5000 mg | ORAL_TABLET | ORAL | Status: DC

## 2014-10-17 MED ORDER — LEVOFLOXACIN 750 MG PO TABS: 750.0000 mg | ORAL_TABLET | Freq: Every day | ORAL | Status: DC

## 2014-10-17 NOTE — Progress Notes (Signed)
Discharge instructions given. Pt verbalized understanding and all questions were answered.  

## 2014-10-17 NOTE — Progress Notes (Signed)
ANTICOAGULATION CONSULT NOTE - Follow Up Consult  Pharmacy Consult for Lovenox  Indication: Afib  Allergies  Allergen Reactions  . Oxycodone Hcl Other (See Comments)     makes pt "wild"  . Propoxyphene N-Acetaminophen Other (See Comments)     Makes pt. "wild"    Patient Measurements: Height: 6\' 7"  (200.7 cm) Weight: (!) 320 lb 8.8 oz (145.4 kg) IBW/kg (Calculated) : 93.7  Vital Signs: Temp: 98.6 F (37 C) (05/18 0533) Temp Source: Oral (05/18 0533) BP: 127/47 mmHg (05/18 0533) Pulse Rate: 65 (05/18 0533)  Labs:  Recent Labs  10/14/14 2128 10/15/14 0331 10/16/14 0325 10/17/14 0430  HGB 14.3 12.4* 12.1* 11.4*  HCT 43.9 37.7* 37.8* 35.9*  PLT 219 207 180 194  APTT  --  35  --   --   LABPROT  --  19.2* 17.7* 17.1*  INR  --  1.60* 1.44 1.38  CREATININE 1.33* 1.35* 1.16  --   TROPONINI <0.03 <0.03  --   --     Estimated Creatinine Clearance: 80.8 mL/min (by C-G formula based on Cr of 1.16).   Assessment: [age] YOM with hx of dementia, DVT and afib on couamdin PTA admitted on 5/15 for Urosepsis. She is on Coumadin PTA for afib and hx of DVT,  home dose resumed per MD, INR down to 1.38 today, all doses charged given. He is currently also on full dose lovenox since INR is subtherapeutic.   Coumadin PTA dose: 5mg  on MWF, 7.5 mg on TTSS (last dose 5/14).  Goal of Therapy:  INR 2-3 Anti-Xa level 0.6-1 units/ml 4hrs after LMWH dose given Monitor platelets by anticoagulation protocol: Yes   Plan:  Coumadin 10 mg x 1 today instead of usual home dose. If discharge today, continue home dose from tomorrow and INR follow up later this week or early next week. Lovenox 150mg  sq q 12 hrs. Monitor daily PT/INR D/C IV rocephin since antibiotics switched to levaquin.  Bayard HuggerMei Ryosuke Ericksen, PharmD, BCPS  Clinical Pharmacist  Pager: [phone]   10/17/2014,11:53 AM

## 2014-10-17 NOTE — Discharge Summary (Signed)
Physician Discharge Summary  SAXTON CHAIN ZOX:[id number] DOB: 07/20/1931 DOA: 10/14/2014  PCP: Frederica Kuster, MD  Admit date: 10/14/2014 Discharge date: 10/17/2014  Time spent: >30 minutes  Recommendations for Outpatient Follow-up:  BMET to follow renal function and electrolytes INR to follow coumadin level  Discharge Diagnoses:  Principal Problem:   Sepsis secondary to UTI Active Problems:   Chronic venous insufficiency   COPD (chronic obstructive pulmonary disease)   AAA (abdominal aortic aneurysm) without rupture   Cognitive impairment   Dementia   Anticoagulation goal of INR 2 to 3   Left leg DVT   B12 deficiency   Atrial fibrillation with RVR   DVT (deep venous thrombosis)   UTI (urinary tract infection)   Cardiomyopathy   Dilated cardiomyopathy   Atrial fibrillation with RVR   Other emphysema   Venous stasis ulcer   Discharge Condition: stable and improved. Patient discharge home with home health services. Follow up with PCP in 2 weeks  Diet recommendation: heart healthy diet   Filed Weights   10/15/14 0436 10/16/14 0442 10/17/14 0533  Weight: 132.2 kg (291 lb 7.2 oz) 147.6 kg (325 lb 6.4 oz) 145.4 kg (320 lb 8.8 oz)    History of present illness:  81 y.o. WM PMHx cognitive impairment, dementia, left leg DVT on anticoagulation, atrial fibrillation with RVR, Cardiomyopathy AAA, venous insufficiency, atrial fibrillation, COPD, falls; who presented with fever and generalized weakness. Reportedly had been getting up at home and became weak in his legs and had to sit down. No injury from that. Has had fevers reportedly up to 101.7 at home. Has had a little bit of cough. Minimal sputum production. No diarrhea. No nausea vomiting. He does have some chronic venous wounds on his legs that have reprtedly been doing well. He has some dressings on them.  Hospital Course:  1-sepsis due to UTI (Citrobacter Freundii) -now with sepsis features resolved at discharge -patient  will complete antibiotic therapy with the use of levaquin -close follow up to his INR given of levaquin Advise to maintain good hydration  2-chronic diastolic heart failure: compensated and stable -continue low sodium diet -continue diuretics -patient check weight on daily basis  3-Atrial fibrillation: on chronic coumadin -continue diltiazem -continue coumadin -close follow up to INR while on levaquin  4-AAA w/o Rupture: continue follow up in outpatient setting with vascular surgery  5-COPD: stable and currently not on any home meds -no wheezing on exam -patient to follow with PCP in outpatient setting and determine further needs  6-glaucoma: continue latanaprost   7-BPH: continue cardura  8-physical deconditioning: will discharge with [facility] services.  9-acute kidney injury: due to UTI and continue use of nephrotoxic agents -resolved with IVF's and antibiotics -will follow BMET during follow up visit -lasix resume at discharge   10-venous insufficiency and chronic wounds: continue wound care.   Procedures:  See below for x-ray reports   Consultations:  None   Discharge Exam: Filed Vitals:   10/17/14 1328  BP: 111/53  Pulse: 71  Temp: 98.2 F (36.8 C)  Resp: 20    General: afebrile, no CP, no SOB; AAOX3; Denies dysuria Cardiovascular: S1 and S2, no rubs or gallops Respiratory: no wheezing, no crackles, good air movement Abd: soft, NT, ND Neuro: non focal.  Discharge Instructions   Discharge Instructions    Diet - low sodium heart healthy    Complete by:  As directed      Discharge instructions    Complete by:  As  directed   Follow heart healthy diet (low sodium; less than 2.5 gram daily) Take medications as prescribed Maintain good hydration Arrange visit for hospital follow up with PCP in 2 weeks          Current Discharge Medication List    START taking these medications   Details  levofloxacin (LEVAQUIN) 750 MG tablet Take 1 tablet (750 mg  total) by mouth daily. Qty: 6 tablet, Refills: 0      CONTINUE these medications which have CHANGED   Details  furosemide (LASIX) 20 MG tablet Take 3 tablets (60 mg total) by mouth daily.      CONTINUE these medications which have NOT CHANGED   Details  acetaminophen (TYLENOL) 500 MG tablet Take 1,000 mg by mouth every 6 (six) hours as needed (pain).    diltiazem (CARDIZEM CD) 120 MG 24 hr capsule Take 120 mg by mouth daily.     doxazosin (CARDURA) 4 MG tablet Take 4 mg by mouth at bedtime.     finasteride (PROSCAR) 5 MG tablet Take 5 mg by mouth at bedtime.     latanoprost (XALATAN) 0.005 % ophthalmic solution Place 1 drop into both eyes at bedtime.     potassium chloride (K-DUR) 10 MEQ tablet TAKE 1 TABLET DAILY Qty: 30 tablet, Refills: 5    warfarin (COUMADIN) 5 MG tablet Take 1 tablet (5 mg total) by mouth daily. Qty: 30 tablet, Refills: 2    ZINC OXIDE EX Apply 1 application topically once a week. Apply to legs under wrapping once a week       Allergies  Allergen Reactions  . Oxycodone Hcl Other (See Comments)     makes pt "wild"  . Propoxyphene N-Acetaminophen Other (See Comments)     Makes pt. "wild"   Follow-up Information    Follow up with [facility].   Why:  Physical Therapist   Contact information:   [address] [place] [address] [phone]       Follow up with Frederica KusterMILLER, STEPHEN M, MD. Schedule an appointment as soon as possible for a visit in 2 weeks.   Specialty:  Family Medicine   Contact information:   [address] [place] [address] [phone]       The results of significant diagnostics from this hospitalization (including imaging, microbiology, ancillary and laboratory) are listed below for reference.    Significant Diagnostic Studies: Dg Chest 2 View  10/14/2014   CLINICAL DATA:  Acute onset of fever.  Initial encounter.  EXAM: CHEST  2 VIEW  COMPARISON:  Chest radiograph performed 08/31/2014   FINDINGS: The lungs are well-aerated. Vascular congestion is noted. Mildly increased interstitial markings raise concern for mild interstitial edema. No definite pleural effusion or pneumothorax is seen.  The heart is borderline normal in size. No acute osseous abnormalities are seen.  IMPRESSION: Vascular congestion noted. Mildly increased interstitial markings raise concern for mild interstitial edema.   Electronically Signed   By: Roanna RaiderJeffery  Chang M.D.   On: 10/14/2014 22:31   [place] Aorta  10/15/2014   CLINICAL DATA:  81 year old male with history of of abdominal aortic aneurysm. Subsequent encounter.  EXAM: ULTRASOUND OF ABDOMINAL AORTA  TECHNIQUE: Ultrasound examination of the abdominal aorta was performed to evaluate for abdominal aortic aneurysm.  COMPARISON:  03/19/2014.  FINDINGS: Abdominal Aorta  Lower abdominal aortic aneurysm with maximal transverse dimension of 4.5 x 4.6 cm and spanning over 5.6 cm. Previously, this measured 4.3 x 3.8 cm spanning  over 5.6 cm.  Ectatic common iliac arteries with maximal AP dimension 1.5 cm  IMPRESSION: Suggestion of increase in size of lower abdominal aortic aneurysm as noted above. Given the fact that apparent increase in size may be partially explained by differences in technique (ultrasound versus CT), follow-up CT at the present time is recommended. If the patient is not able to receive intravenous contrast, unenhanced CT of the abdomen and pelvis may then be obtained.   Electronically Signed   By: Lacy DuverneySteven  Olson M.D.   On: 10/15/2014 07:49    Microbiology: Recent Results (from the past 240 hour(s))  Culture, blood (x 2)     Status: None (Preliminary result)   Collection Time: 10/15/14  3:31 AM  Result Value Ref Range Status   Specimen Description BLOOD LEFT HAND  Final   Special Requests BOTTLES DRAWN AEROBIC AND ANAEROBIC 5CC  Final   Culture   Final           BLOOD CULTURE RECEIVED NO GROWTH TO DATE CULTURE WILL BE HELD FOR 5 DAYS BEFORE ISSUING A FINAL  NEGATIVE REPORT Performed at [facility]    Report Status PENDING  Incomplete  Culture, blood (x 2)     Status: None (Preliminary result)   Collection Time: 10/15/14  3:39 AM  Result Value Ref Range Status   Specimen Description BLOOD LEFT HAND  Final   Special Requests BOTTLES DRAWN AEROBIC AND ANAEROBIC 5CC  Final   Culture   Final           BLOOD CULTURE RECEIVED NO GROWTH TO DATE CULTURE WILL BE HELD FOR 5 DAYS BEFORE ISSUING A FINAL NEGATIVE REPORT Performed at [facility]    Report Status PENDING  Incomplete  Urine culture     Status: None   Collection Time: 10/15/14  3:57 AM  Result Value Ref Range Status   Specimen Description URINE, RANDOM  Final   Special Requests NONE  Final   Colony Count   Final    >=100,000 COLONIES/ML Performed at [facility]    Culture   Final    CITROBACTER FREUNDII Performed at [facility]    Report Status 10/17/2014 FINAL  Final   Organism ID, Bacteria CITROBACTER FREUNDII  Final      Susceptibility   Citrobacter freundii - MIC*    CEFAZOLIN >=64 RESISTANT Resistant     CEFTRIAXONE >=64 RESISTANT Resistant     CIPROFLOXACIN <=0.25 SENSITIVE Sensitive     GENTAMICIN <=1 SENSITIVE Sensitive     LEVOFLOXACIN 0.5 SENSITIVE Sensitive     NITROFURANTOIN 32 SENSITIVE Sensitive     TOBRAMYCIN <=1 SENSITIVE Sensitive     TRIMETH/SULFA <=20 SENSITIVE Sensitive     PIP/TAZO 16 SENSITIVE Sensitive     * CITROBACTER FREUNDII     Labs: Basic Metabolic Panel:  Recent Labs Lab 10/14/14 2128 10/15/14 0331 10/16/14 0325  NA 138 139 142  K 3.9 3.8 3.9  CL 101 104 109  CO2 28 27 26   GLUCOSE 138* 123* 107*  BUN 18 17 18   CREATININE 1.33* 1.35* 1.16  CALCIUM 9.1 8.8* 8.6*   Liver Function Tests:  Recent Labs Lab 10/14/14 2128 10/15/14 0331 10/16/14 0325  AST 22 20 14*  ALT 20 18 15*  ALKPHOS 79 72 62  BILITOT 0.9 1.3* 0.5  PROT 7.1 6.1* 6.0*  ALBUMIN 3.6 3.2* 2.9*   CBC:  Recent Labs Lab  10/14/14 2128 10/15/14 0331 10/16/14 0325 10/17/14 0430  WBC  12.9* 14.5* 7.8 6.2  NEUTROABS 11.2*  --   --   --   HGB 14.3 12.4* 12.1* 11.4*  HCT 43.9 37.7* 37.8* 35.9*  MCV 89.6 89.1 90.2 90.9  PLT 219 207 180 194   Cardiac Enzymes:  Recent Labs Lab 10/14/14 2128 10/15/14 0331  TROPONINI <0.03 <0.03    Signed:  Vassie Loll  [facility] 10/17/2014, 3:07 PM

## 2014-10-17 NOTE — Care Management Note (Addendum)
Case Management Note  Patient Details  Name: [name] MRN: [id number] Date of Birth: 01-Feb-1932  Subjective/Objective:  Pt admitted with sepsis                .  Action/Plan:  CM was request by MD to [facility].   Expected Discharge Date:                  Expected Discharge Plan:  Home w Home Health Services  In-House Referral:     Discharge planning Services  CM Consult  Post Acute Care Choice:    Choice offered to:  Patient  DME Arranged:    DME Agency:     HH Arranged:  PT, RN, OT HH Agency:  [facility]  Status of Service:  Completed, signed off  Medicare Important Message Given:  Yes Date Medicare IM Given:  10/24/14 Medicare IM give by:  Raynald BlendSamantha Treniece [place] Date Additional Medicare IM Given:    Additional Medicare Important Message give by:      Disposition Plan:  Home with Home Health Service  If discussed at Long Length of Stay Meetings, dates discussed:    Additional Comments: 10/17/14 Raynald BlendSamantha Tyri Elmore, RN, BSN [phone].  CM requested MD order PT recommendations if deemed necessary, MD acknowledged and will order PT recommendations.  [facility], pt chose [facility].  CM verified address and phone number in epic.  CM contacted [facility], referral was accepted.  [facility]  as well as 3:1, pt stated he already has 3:1 but chose [facility] for OT and RN; [facility] Contacted to inform of additional resource need; referral accepted.  Per pt and wife; wife will provide 24 hour supervision post discharge.   No additional CM needs at this time.      Cherylann ParrClaxton, Briann Sarchet S, RN 10/17/2014, 11:13 AM

## 2014-10-17 NOTE — Discharge Instructions (Addendum)

## 2014-10-17 NOTE — Evaluation (Signed)
Occupational Therapy Evaluation Patient Details Name: [name] MRN: [id number] DOB: 1931/06/03 Today's Date: 10/17/2014    History of Present Illness Pt is an 81 y.o. male with a history of dementia, DVT, and afib. Pt was admitted on 5/15 for urosepsis.    Clinical Impression   Pt admitted with above. Pt reports he was getting assist with ADLs, PTA. Feel pt will benefit from acute OT to increase independence, strength, and activity tolerance prior to d/c. Recommending HHOT.    Follow Up Recommendations  Home health OT;Supervision/Assistance - 24 hour    Equipment Recommendations  3 in 1 bedside comode    Recommendations for Other Services       Precautions / Restrictions Precautions Precautions: Fall Restrictions Weight Bearing Restrictions: No      Mobility Bed Mobility Overal bed mobility: Needs Assistance Bed Mobility: Supine to Sit     Supine to sit: Supervision        Transfers Overall transfer level: Needs assistance   Transfers: Sit to/from Stand Sit to Stand: Min assist-Mod assist;From elevated surface Stand pivot transfers: Min guard;From elevated surface       General transfer comment: stood with more assist from elevated bed. Pt stood also from chair.    Balance                                            ADL Overall ADL's : Needs assistance/impaired             Lower Body Bathing: Moderate assistance;Sit to/from stand   Upper Body Dressing : Set up;Sitting   Lower Body Dressing: Moderate assistance;Sit to/from stand;Maximal assistance   Toilet Transfer: Minimal assistance;Stand-pivot;RW;Min guard (assist for sit to stand transfer; stand pivot with Min guard)   Toileting- Clothing Manipulation and Hygiene: Moderate assistance;Sit to/from stand;Maximal assistance       Functional mobility during ADLs: Min guard;Rolling walker (stand pivot from bed to chair) General ADL Comments: Pt SOB in session. Explained  there is AE that can assist with LB ADLs. OT assisted in washing pt's bottom more thoroughly while standing.     Vision     Perception     Praxis      Pertinent Vitals/Pain Pain Assessment: No/denies pain     Hand Dominance     Extremity/Trunk Assessment Upper Extremity Assessment Upper Extremity Assessment: Overall WFL for tasks assessed   Lower Extremity Assessment Lower Extremity Assessment: Defer to PT evaluation       Communication Communication Communication: No difficulties   Cognition Arousal/Alertness: Awake/alert Behavior During Therapy: WFL for tasks assessed/performed Overall Cognitive Status: History of cognitive impairments - at baseline                     General Comments       Exercises       Shoulder Instructions      Home Living Family/patient expects to be discharged to:: Private residence Living Arrangements: Spouse/significant other Available Help at Discharge: Family;Available 24 hours/day Type of Home: House Home Access: Ramped entrance     Home Layout: One level;Laundry or work area in basement     Foot LockerBathroom Shower/Tub: DietitianTub/shower unit         Home Equipment: Wheelchair - Economistmanual;Shower seat;Walker - 2 wheels          Prior Functioning/Environment Level of Independence: Needs assistance  ADL's / Homemaking Assistance Needed: pt reports he sponge bathes and gets assist with LB dressing/bathing.        OT Diagnosis: Generalized weakness   OT Problem List: Decreased strength;Decreased range of motion;Decreased knowledge of precautions;Decreased knowledge of use of DME or AE;Pain;Cardiopulmonary status limiting activity;Decreased activity tolerance;Decreased cognition   OT Treatment/Interventions: Self-care/ADL training;Therapeutic exercise;Energy conservation;DME and/or AE instruction;Therapeutic activities;Cognitive remediation/compensation;Patient/family education;Balance training    OT Goals(Current goals can  be found in the care plan section) Acute Rehab OT Goals Patient Stated Goal: not stated OT Goal Formulation: With patient Time For Goal Achievement: 10/24/14 Potential to Achieve Goals: Good  OT Frequency: Min 2X/week   Barriers to D/C:            Co-evaluation              End of Session Equipment Utilized During Treatment: Gait belt;Rolling walker Nurse Communication: Other (comment) (told nursing tech he was in chair with alarm set and mobility status)  Activity Tolerance: Patient tolerated treatment well Patient left: in chair;with call bell/phone within reach;with chair alarm set   Time: 1132-1148 OT Time Calculation (min): 16 min Charges:  OT General Charges $OT Visit: 1 Procedure OT Evaluation $Initial OT Evaluation Tier I: 1 Procedure G-CodesEarlie Raveling:    Syrai Gladwin L OTR/L [phone] 10/17/2014, 12:09 PM

## 2014-10-17 NOTE — Care Management (Signed)
Medicare Important Message given? Yes (If Response is "NO", the following Medicare IM given date fields will be blank) Date Medicare IM given:  10/17/14 Medicare IM given by: Ibrahim Mcpheeters 

## 2014-10-19 ENCOUNTER — Telehealth: Payer: Self-pay | Admitting: *Deleted

## 2014-10-19 ENCOUNTER — Ambulatory Visit (INDEPENDENT_AMBULATORY_CARE_PROVIDER_SITE_OTHER): Payer: Medicare Other | Admitting: Pharmacist

## 2014-10-19 DIAGNOSIS — Z5181 Encounter for therapeutic drug level monitoring: Secondary | ICD-10-CM | POA: Diagnosis not present

## 2014-10-19 DIAGNOSIS — I4891 Unspecified atrial fibrillation: Secondary | ICD-10-CM | POA: Diagnosis not present

## 2014-10-19 DIAGNOSIS — I82402 Acute embolism and thrombosis of unspecified deep veins of left lower extremity: Secondary | ICD-10-CM | POA: Diagnosis not present

## 2014-10-19 DIAGNOSIS — R32 Unspecified urinary incontinence: Secondary | ICD-10-CM | POA: Diagnosis not present

## 2014-10-19 DIAGNOSIS — I872 Venous insufficiency (chronic) (peripheral): Secondary | ICD-10-CM | POA: Diagnosis not present

## 2014-10-19 DIAGNOSIS — Z7901 Long term (current) use of anticoagulants: Secondary | ICD-10-CM | POA: Diagnosis not present

## 2014-10-19 DIAGNOSIS — J449 Chronic obstructive pulmonary disease, unspecified: Secondary | ICD-10-CM | POA: Diagnosis not present

## 2014-10-19 DIAGNOSIS — I739 Peripheral vascular disease, unspecified: Secondary | ICD-10-CM | POA: Diagnosis not present

## 2014-10-19 LAB — POCT INR: INR: 1.5

## 2014-10-19 NOTE — Telephone Encounter (Signed)
Done and faxed to daughters home

## 2014-10-19 NOTE — Telephone Encounter (Signed)
Current warfarin dose is 5mg  1 tablet daily and 1mg  1/2 tablet on sundays, tuesdays, thursdays and saturdays. INR low and was low at hospital.  Recommend take 7.5mg  today then increase warfarin dose - take 5mg  1 tablet daily and 1mg  take 1 tablet on sundays, tuesdays, thursdays and saturdays. Recheck INR in 1 week.  [name]'s wife and In[facility]ina [facility].

## 2014-10-19 NOTE — Telephone Encounter (Signed)
Protime  17.6  INR 1.5 Patient started Levequin yesterday.Takes 5 mg qd, and 1/2 of 1mg  on t,th,sat, and sun

## 2014-10-21 DIAGNOSIS — I4891 Unspecified atrial fibrillation: Secondary | ICD-10-CM | POA: Diagnosis not present

## 2014-10-21 DIAGNOSIS — I82402 Acute embolism and thrombosis of unspecified deep veins of left lower extremity: Secondary | ICD-10-CM | POA: Diagnosis not present

## 2014-10-21 DIAGNOSIS — I739 Peripheral vascular disease, unspecified: Secondary | ICD-10-CM | POA: Diagnosis not present

## 2014-10-21 DIAGNOSIS — I872 Venous insufficiency (chronic) (peripheral): Secondary | ICD-10-CM | POA: Diagnosis not present

## 2014-10-21 DIAGNOSIS — Z7901 Long term (current) use of anticoagulants: Secondary | ICD-10-CM | POA: Diagnosis not present

## 2014-10-21 DIAGNOSIS — J449 Chronic obstructive pulmonary disease, unspecified: Secondary | ICD-10-CM | POA: Diagnosis not present

## 2014-10-21 DIAGNOSIS — Z5181 Encounter for therapeutic drug level monitoring: Secondary | ICD-10-CM | POA: Diagnosis not present

## 2014-10-21 DIAGNOSIS — R32 Unspecified urinary incontinence: Secondary | ICD-10-CM | POA: Diagnosis not present

## 2014-10-21 LAB — CULTURE, BLOOD (ROUTINE X 2)
CULTURE: NO GROWTH
Culture: NO GROWTH

## 2014-10-22 ENCOUNTER — Other Ambulatory Visit: Payer: Self-pay

## 2014-10-22 ENCOUNTER — Telehealth: Payer: Self-pay | Admitting: Family Medicine

## 2014-10-22 MED ORDER — POTASSIUM CHLORIDE ER 10 MEQ PO TBCR: 10.0000 meq | EXTENDED_RELEASE_TABLET | Freq: Every day | ORAL | Status: DC

## 2014-10-23 DIAGNOSIS — J449 Chronic obstructive pulmonary disease, unspecified: Secondary | ICD-10-CM | POA: Diagnosis not present

## 2014-10-23 DIAGNOSIS — I82402 Acute embolism and thrombosis of unspecified deep veins of left lower extremity: Secondary | ICD-10-CM | POA: Diagnosis not present

## 2014-10-23 DIAGNOSIS — Z5181 Encounter for therapeutic drug level monitoring: Secondary | ICD-10-CM | POA: Diagnosis not present

## 2014-10-23 DIAGNOSIS — I739 Peripheral vascular disease, unspecified: Secondary | ICD-10-CM | POA: Diagnosis not present

## 2014-10-23 DIAGNOSIS — I872 Venous insufficiency (chronic) (peripheral): Secondary | ICD-10-CM | POA: Diagnosis not present

## 2014-10-23 DIAGNOSIS — Z7901 Long term (current) use of anticoagulants: Secondary | ICD-10-CM | POA: Diagnosis not present

## 2014-10-23 DIAGNOSIS — I4891 Unspecified atrial fibrillation: Secondary | ICD-10-CM | POA: Diagnosis not present

## 2014-10-23 DIAGNOSIS — R32 Unspecified urinary incontinence: Secondary | ICD-10-CM | POA: Diagnosis not present

## 2014-10-25 ENCOUNTER — Telehealth: Payer: Self-pay | Admitting: *Deleted

## 2014-10-25 ENCOUNTER — Ambulatory Visit (INDEPENDENT_AMBULATORY_CARE_PROVIDER_SITE_OTHER): Payer: Medicare Other | Admitting: Pharmacist

## 2014-10-25 DIAGNOSIS — I739 Peripheral vascular disease, unspecified: Secondary | ICD-10-CM | POA: Diagnosis not present

## 2014-10-25 DIAGNOSIS — Z5181 Encounter for therapeutic drug level monitoring: Secondary | ICD-10-CM | POA: Diagnosis not present

## 2014-10-25 DIAGNOSIS — I4891 Unspecified atrial fibrillation: Secondary | ICD-10-CM | POA: Diagnosis not present

## 2014-10-25 DIAGNOSIS — J449 Chronic obstructive pulmonary disease, unspecified: Secondary | ICD-10-CM | POA: Diagnosis not present

## 2014-10-25 DIAGNOSIS — R32 Unspecified urinary incontinence: Secondary | ICD-10-CM | POA: Diagnosis not present

## 2014-10-25 DIAGNOSIS — Z7901 Long term (current) use of anticoagulants: Secondary | ICD-10-CM | POA: Diagnosis not present

## 2014-10-25 DIAGNOSIS — I82402 Acute embolism and thrombosis of unspecified deep veins of left lower extremity: Secondary | ICD-10-CM | POA: Diagnosis not present

## 2014-10-25 DIAGNOSIS — I872 Venous insufficiency (chronic) (peripheral): Secondary | ICD-10-CM | POA: Diagnosis not present

## 2014-10-25 LAB — POCT INR: INR: 1.9

## 2014-10-25 NOTE — Telephone Encounter (Signed)
Take 1 and 1/2 tablets of 5mg  today, then resume dose of warfarin 5mg  1 tablet daily and warfarin 1mg  1 tablet on sundays, Tuesday, thursdays and saturdays. [facility] and patient's wife notified.   Will recheck in office in 1 week since patient already has appt.

## 2014-10-25 NOTE — Telephone Encounter (Signed)
PT: 22.7 INR: 1.9  He is off his levaquin. He is currently taking 5mg  everyday except an additional 1mg  on tue, thurs, sat, sun

## 2014-10-26 ENCOUNTER — Encounter: Payer: Self-pay | Admitting: Family Medicine

## 2014-10-26 ENCOUNTER — Encounter: Payer: Self-pay | Admitting: Gastroenterology

## 2014-10-28 DIAGNOSIS — I739 Peripheral vascular disease, unspecified: Secondary | ICD-10-CM | POA: Diagnosis not present

## 2014-10-28 DIAGNOSIS — Z5181 Encounter for therapeutic drug level monitoring: Secondary | ICD-10-CM | POA: Diagnosis not present

## 2014-10-28 DIAGNOSIS — R32 Unspecified urinary incontinence: Secondary | ICD-10-CM | POA: Diagnosis not present

## 2014-10-28 DIAGNOSIS — I4891 Unspecified atrial fibrillation: Secondary | ICD-10-CM | POA: Diagnosis not present

## 2014-10-28 DIAGNOSIS — Z7901 Long term (current) use of anticoagulants: Secondary | ICD-10-CM | POA: Diagnosis not present

## 2014-10-28 DIAGNOSIS — I872 Venous insufficiency (chronic) (peripheral): Secondary | ICD-10-CM | POA: Diagnosis not present

## 2014-10-28 DIAGNOSIS — I82402 Acute embolism and thrombosis of unspecified deep veins of left lower extremity: Secondary | ICD-10-CM | POA: Diagnosis not present

## 2014-10-28 DIAGNOSIS — J449 Chronic obstructive pulmonary disease, unspecified: Secondary | ICD-10-CM | POA: Diagnosis not present

## 2014-10-30 DIAGNOSIS — R32 Unspecified urinary incontinence: Secondary | ICD-10-CM | POA: Diagnosis not present

## 2014-10-30 DIAGNOSIS — I82402 Acute embolism and thrombosis of unspecified deep veins of left lower extremity: Secondary | ICD-10-CM | POA: Diagnosis not present

## 2014-10-30 DIAGNOSIS — J449 Chronic obstructive pulmonary disease, unspecified: Secondary | ICD-10-CM | POA: Diagnosis not present

## 2014-10-30 DIAGNOSIS — I872 Venous insufficiency (chronic) (peripheral): Secondary | ICD-10-CM | POA: Diagnosis not present

## 2014-10-30 DIAGNOSIS — I4891 Unspecified atrial fibrillation: Secondary | ICD-10-CM | POA: Diagnosis not present

## 2014-10-30 DIAGNOSIS — Z7901 Long term (current) use of anticoagulants: Secondary | ICD-10-CM | POA: Diagnosis not present

## 2014-10-30 DIAGNOSIS — Z5181 Encounter for therapeutic drug level monitoring: Secondary | ICD-10-CM | POA: Diagnosis not present

## 2014-10-30 DIAGNOSIS — I739 Peripheral vascular disease, unspecified: Secondary | ICD-10-CM | POA: Diagnosis not present

## 2014-11-01 ENCOUNTER — Ambulatory Visit: Payer: Self-pay | Admitting: Family Medicine

## 2014-11-01 ENCOUNTER — Encounter: Payer: Self-pay | Admitting: Family Medicine

## 2014-11-01 ENCOUNTER — Ambulatory Visit (INDEPENDENT_AMBULATORY_CARE_PROVIDER_SITE_OTHER): Payer: Medicare Other | Admitting: Family Medicine

## 2014-11-01 VITALS — BP 118/55 | HR 65 | Temp 97.0°F

## 2014-11-01 DIAGNOSIS — F0391 Unspecified dementia with behavioral disturbance: Secondary | ICD-10-CM | POA: Diagnosis not present

## 2014-11-01 DIAGNOSIS — J449 Chronic obstructive pulmonary disease, unspecified: Secondary | ICD-10-CM

## 2014-11-01 DIAGNOSIS — I872 Venous insufficiency (chronic) (peripheral): Secondary | ICD-10-CM

## 2014-11-01 NOTE — Progress Notes (Signed)
Subjective:    Patient ID: [name], male    DOB: 1931-10-04, 81 y.o.   MRN: [id number]  HPI 81 year old gentleman here with chronic venous insufficiency dementia, COPD, and CHF. We have been doing dressing changes on his legs which have some tissue breakdown due to venous stasis disease. He was hospitalized for 3 days since his last visit. Reason seem to be urinary tract infection. Studies done in the hospital showed no worsening of COPD or heart failure but he was diuresed with IV diarrhetic and bedrest and the swelling in his legs diminished significantly. Since he has been home however swelling is coming back despite Lasix 80 mg a day. There is been no change in his breathing. Patient Active Problem List   Diagnosis Date Noted  . Sepsis secondary to UTI 10/15/2014  . Cardiomyopathy 10/15/2014  . Dilated cardiomyopathy   . Atrial fibrillation with RVR   . Other emphysema   . Venous stasis ulcer   . UTI (urinary tract infection) 10/14/2014  . Cough 09/02/2014  . Cold 08/29/2014  . Open wound of knee, leg (except thigh), and ankle, complicated 07/22/2014  . Rectal bleeding 01/17/2014  . DVT (deep venous thrombosis) 11/07/2013  . Hypernatremia 09/29/2013  . CHF (congestive heart failure) 09/26/2013  . Atrial fibrillation with RVR 09/22/2013  . Acute respiratory failure 09/22/2013  . Knee effusion, right 09/21/2013  . Traumatic hematoma of right knee 09/21/2013  . B12 deficiency 09/20/2013  . History of DVT (deep vein thrombosis) 09/18/2013  . Weakness 09/18/2013  . Left leg DVT 08/03/2013  . Anticoagulation goal of INR 2 to 3 06/01/2013  . Leg DVT (deep venous thromboembolism), acute 05/04/2013  . Pain in limb-Bilateral leg 03/07/2013  . Edema-Bilat Leg and foot 03/07/2013  . Pain in joint, lower leg 01/26/2013  . Pedal edema 10/06/2012  . Chronic venous insufficiency 10/06/2012  . COPD (chronic obstructive pulmonary disease) 10/06/2012  . Obesity, unspecified 10/06/2012    . AAA (abdominal aortic aneurysm) without rupture 10/06/2012  . Cognitive impairment 10/06/2012  . Dementia 10/06/2012  . Peripheral vascular disease 09/20/2012  . Abdominal aortic aneurysm 03/01/2012   Outpatient Encounter Prescriptions as of 11/01/2014  Medication Sig  . acetaminophen (TYLENOL) 500 MG tablet Take 1,000 mg by mouth every 6 (six) hours as needed (pain).  [name]. diltiazem (CARDIZEM CD) 120 MG 24 hr capsule Take 120 mg by mouth daily.   [name]. doxazosin (CARDURA) 4 MG tablet Take 4 mg by mouth at bedtime.   . finasteride (PROSCAR) 5 MG tablet Take 5 mg by mouth at bedtime.   . furosemide (LASIX) 80 MG tablet Take 1 tablet by mouth daily.  [name]. latanoprost (XALATAN) 0.005 % ophthalmic solution Place 1 drop into both eyes at bedtime.   . potassium chloride (K-DUR) 10 MEQ tablet Take 1 tablet (10 mEq total) by mouth daily.  [name]. warfarin (COUMADIN) 1 MG tablet Take 1 mg by mouth daily. Take one tablet by mouth on Sunday, Tuesday, Thursday, and Saturday with 5mg  tablet that is taken daily  . warfarin (COUMADIN) 5 MG tablet Take 1 tablet (5 mg total) by mouth daily. (Patient taking differently: Take 5-7.5 mg by mouth daily with supper. Take 1 1/2 tablets (7.5 mg) on Sunday, Tuesday, Thursday and Saturday, take 1 tablet (5 mg) on Monday, Wednesday, Friday)  . ZINC OXIDE EX Apply 1 application topically once a week. Apply to legs under wrapping once a week  . [DISCONTINUED] warfarin (COUMADIN) 1 MG tablet Take 1  mg by mouth. Take 1 tablet on Sunday, Tuesday, thursday and Saturday (along with warfarin  - which you are to take once daily)  . [DISCONTINUED] furosemide (LASIX) 20 MG tablet Take 3 tablets (60 mg total) by mouth daily.  . [DISCONTINUED] levofloxacin (LEVAQUIN) 750 MG tablet Take 1 tablet (750 mg total) by mouth daily.   No facility-administered encounter medications on file as of 11/01/2014.       Review of Systems  HENT: Negative.   Respiratory: Negative.   Cardiovascular: Positive  for leg swelling.  Neurological: Negative.   Psychiatric/Behavioral: Negative.        Objective:   Physical Exam  Constitutional: He appears well-developed and well-nourished.  Cardiovascular: Normal rate and regular rhythm.   Pulmonary/Chest: Effort normal and breath sounds normal.  Skin:  Legs were unwrapped after dressings had been on one week. I do not see much change but they're no worse so maybe that's positive. Per family swelling seems to be increasing since his been home from the hospital.   BP 118/55 mmHg  Pulse 65  Temp(Src) 97 F (36.1 C) (Oral)  Wt         Assessment & Plan:  1. Chronic venous insufficiency This is a difficult problem area and patient sits with his feet down most of the day is unable to get up and ambulate and walk and the legs probably are going to get better. Family is frustrated. His wife is becoming more difficult for her to take care of him. Fortunately he has not fallen recently home therapy is doing dressing changes.  2. Chronic obstructive pulmonary disease, unspecified COPD, unspecified chronic bronchitis type No significant change on recent chest x-ray in the hospital  3. Dementia, with behavioral disturbance Dementia is present but not changing very rapidly. Patient is aware enough of his surroundings and wants to stay at home that we cannot really move home to find a alternative such as assisted living which would really be the most appropriate for him at this point in time. Daughter is still looking at these options  Frederica Kuster MD

## 2014-11-01 NOTE — Telephone Encounter (Signed)
appt today, 11/01/14. Dr. Hyacinth MeekerMiller is going to tell him the scale is not necessary, due to him not being able to stand

## 2014-11-02 ENCOUNTER — Telehealth: Payer: Self-pay | Admitting: *Deleted

## 2014-11-02 ENCOUNTER — Ambulatory Visit (INDEPENDENT_AMBULATORY_CARE_PROVIDER_SITE_OTHER): Payer: Medicare Other | Admitting: Pharmacist

## 2014-11-02 DIAGNOSIS — J449 Chronic obstructive pulmonary disease, unspecified: Secondary | ICD-10-CM | POA: Diagnosis not present

## 2014-11-02 DIAGNOSIS — I872 Venous insufficiency (chronic) (peripheral): Secondary | ICD-10-CM | POA: Diagnosis not present

## 2014-11-02 DIAGNOSIS — Z7901 Long term (current) use of anticoagulants: Secondary | ICD-10-CM | POA: Diagnosis not present

## 2014-11-02 DIAGNOSIS — R32 Unspecified urinary incontinence: Secondary | ICD-10-CM | POA: Diagnosis not present

## 2014-11-02 DIAGNOSIS — I480 Paroxysmal atrial fibrillation: Secondary | ICD-10-CM | POA: Diagnosis not present

## 2014-11-02 DIAGNOSIS — Z5181 Encounter for therapeutic drug level monitoring: Secondary | ICD-10-CM | POA: Diagnosis not present

## 2014-11-02 DIAGNOSIS — I739 Peripheral vascular disease, unspecified: Secondary | ICD-10-CM | POA: Diagnosis not present

## 2014-11-02 DIAGNOSIS — I82402 Acute embolism and thrombosis of unspecified deep veins of left lower extremity: Secondary | ICD-10-CM

## 2014-11-02 DIAGNOSIS — I4891 Unspecified atrial fibrillation: Secondary | ICD-10-CM | POA: Diagnosis not present

## 2014-11-02 LAB — POCT INR: INR: 2.2

## 2014-11-02 NOTE — Telephone Encounter (Signed)
Patients wife said you mentioned a prophylactic antibiotic for his frequent uti's?

## 2014-11-02 NOTE — Progress Notes (Signed)
[facility] and reported to our office. Billing once per month interupertation fee.  Patient diagnosis - venous thromboembolism/ DVT (left) and atrial fibrillation Procedure code if [id number]

## 2014-11-02 NOTE — Telephone Encounter (Signed)
protime 25.8  INR 2.2

## 2014-11-02 NOTE — Telephone Encounter (Signed)
Therapeutic anticoagulation.  Continue current warfarin dose of 5mg  tablets 1 tablet daily and 1mg  tablet - take 1 tablet on sundays, tuesdays, thrusdays and saturdays only.  Recheck in 1 week.  [facility] and [facility]'s wife notified.

## 2014-11-03 NOTE — Telephone Encounter (Signed)
We could try trimethoprim 100 mg/day

## 2014-11-05 DIAGNOSIS — I4891 Unspecified atrial fibrillation: Secondary | ICD-10-CM | POA: Diagnosis not present

## 2014-11-05 DIAGNOSIS — Z7901 Long term (current) use of anticoagulants: Secondary | ICD-10-CM | POA: Diagnosis not present

## 2014-11-05 DIAGNOSIS — J449 Chronic obstructive pulmonary disease, unspecified: Secondary | ICD-10-CM | POA: Diagnosis not present

## 2014-11-05 DIAGNOSIS — R32 Unspecified urinary incontinence: Secondary | ICD-10-CM | POA: Diagnosis not present

## 2014-11-05 DIAGNOSIS — I739 Peripheral vascular disease, unspecified: Secondary | ICD-10-CM | POA: Diagnosis not present

## 2014-11-05 DIAGNOSIS — Z5181 Encounter for therapeutic drug level monitoring: Secondary | ICD-10-CM | POA: Diagnosis not present

## 2014-11-05 DIAGNOSIS — I82402 Acute embolism and thrombosis of unspecified deep veins of left lower extremity: Secondary | ICD-10-CM | POA: Diagnosis not present

## 2014-11-05 DIAGNOSIS — I872 Venous insufficiency (chronic) (peripheral): Secondary | ICD-10-CM | POA: Diagnosis not present

## 2014-11-05 MED ORDER — TRIMETHOPRIM 100 MG PO TABS: 100.0000 mg | ORAL_TABLET | Freq: Every day | ORAL | Status: DC

## 2014-11-05 NOTE — Telephone Encounter (Signed)
Trimethoprim sent to pharmacy. Inetta Fermoina with [facility] notified. She will contact patient.

## 2014-11-06 DIAGNOSIS — A499 Bacterial infection, unspecified: Secondary | ICD-10-CM | POA: Diagnosis not present

## 2014-11-06 DIAGNOSIS — J449 Chronic obstructive pulmonary disease, unspecified: Secondary | ICD-10-CM | POA: Diagnosis not present

## 2014-11-06 DIAGNOSIS — R32 Unspecified urinary incontinence: Secondary | ICD-10-CM | POA: Diagnosis not present

## 2014-11-06 DIAGNOSIS — I82402 Acute embolism and thrombosis of unspecified deep veins of left lower extremity: Secondary | ICD-10-CM | POA: Diagnosis not present

## 2014-11-06 DIAGNOSIS — N390 Urinary tract infection, site not specified: Secondary | ICD-10-CM | POA: Diagnosis not present

## 2014-11-06 DIAGNOSIS — Z7901 Long term (current) use of anticoagulants: Secondary | ICD-10-CM | POA: Diagnosis not present

## 2014-11-06 DIAGNOSIS — I4891 Unspecified atrial fibrillation: Secondary | ICD-10-CM | POA: Diagnosis not present

## 2014-11-06 DIAGNOSIS — I872 Venous insufficiency (chronic) (peripheral): Secondary | ICD-10-CM | POA: Diagnosis not present

## 2014-11-06 DIAGNOSIS — I739 Peripheral vascular disease, unspecified: Secondary | ICD-10-CM | POA: Diagnosis not present

## 2014-11-06 DIAGNOSIS — Z5181 Encounter for therapeutic drug level monitoring: Secondary | ICD-10-CM | POA: Diagnosis not present

## 2014-11-08 ENCOUNTER — Telehealth: Payer: Self-pay | Admitting: *Deleted

## 2014-11-08 ENCOUNTER — Ambulatory Visit (INDEPENDENT_AMBULATORY_CARE_PROVIDER_SITE_OTHER): Payer: Medicare Other | Admitting: Pharmacist

## 2014-11-08 DIAGNOSIS — J449 Chronic obstructive pulmonary disease, unspecified: Secondary | ICD-10-CM | POA: Diagnosis not present

## 2014-11-08 DIAGNOSIS — I739 Peripheral vascular disease, unspecified: Secondary | ICD-10-CM | POA: Diagnosis not present

## 2014-11-08 DIAGNOSIS — A499 Bacterial infection, unspecified: Secondary | ICD-10-CM | POA: Diagnosis not present

## 2014-11-08 DIAGNOSIS — I872 Venous insufficiency (chronic) (peripheral): Secondary | ICD-10-CM | POA: Diagnosis not present

## 2014-11-08 DIAGNOSIS — N390 Urinary tract infection, site not specified: Secondary | ICD-10-CM | POA: Diagnosis not present

## 2014-11-08 DIAGNOSIS — Z7901 Long term (current) use of anticoagulants: Secondary | ICD-10-CM | POA: Diagnosis not present

## 2014-11-08 DIAGNOSIS — I82402 Acute embolism and thrombosis of unspecified deep veins of left lower extremity: Secondary | ICD-10-CM | POA: Diagnosis not present

## 2014-11-08 DIAGNOSIS — Z5181 Encounter for therapeutic drug level monitoring: Secondary | ICD-10-CM | POA: Diagnosis not present

## 2014-11-08 DIAGNOSIS — I4891 Unspecified atrial fibrillation: Secondary | ICD-10-CM | POA: Diagnosis not present

## 2014-11-08 DIAGNOSIS — R32 Unspecified urinary incontinence: Secondary | ICD-10-CM | POA: Diagnosis not present

## 2014-11-08 LAB — POCT INR: INR: 2

## 2014-11-08 NOTE — Telephone Encounter (Signed)
Therapeutic anticoagulation.  Continue current dose of warfarin 5mg  1 tablet daily and warfarin 1mg  1 tablet on sundays, Tuesday, thursdays and saturdays. Recheck INR in 2 weeks.  patinet's wife and Inetta Fermo with [facility] notified.

## 2014-11-08 NOTE — Telephone Encounter (Signed)
Protime 24.4  INR 2.0

## 2014-11-08 NOTE — Progress Notes (Signed)
No charge for labs or visit - [facility] checked by [facility] in his home

## 2014-11-10 DIAGNOSIS — A499 Bacterial infection, unspecified: Secondary | ICD-10-CM | POA: Diagnosis not present

## 2014-11-10 DIAGNOSIS — N390 Urinary tract infection, site not specified: Secondary | ICD-10-CM | POA: Diagnosis not present

## 2014-11-10 DIAGNOSIS — R32 Unspecified urinary incontinence: Secondary | ICD-10-CM | POA: Diagnosis not present

## 2014-11-10 DIAGNOSIS — I739 Peripheral vascular disease, unspecified: Secondary | ICD-10-CM | POA: Diagnosis not present

## 2014-11-10 DIAGNOSIS — I872 Venous insufficiency (chronic) (peripheral): Secondary | ICD-10-CM | POA: Diagnosis not present

## 2014-11-10 DIAGNOSIS — Z5181 Encounter for therapeutic drug level monitoring: Secondary | ICD-10-CM | POA: Diagnosis not present

## 2014-11-10 DIAGNOSIS — J449 Chronic obstructive pulmonary disease, unspecified: Secondary | ICD-10-CM | POA: Diagnosis not present

## 2014-11-10 DIAGNOSIS — Z7901 Long term (current) use of anticoagulants: Secondary | ICD-10-CM | POA: Diagnosis not present

## 2014-11-10 DIAGNOSIS — I4891 Unspecified atrial fibrillation: Secondary | ICD-10-CM | POA: Diagnosis not present

## 2014-11-10 DIAGNOSIS — I82402 Acute embolism and thrombosis of unspecified deep veins of left lower extremity: Secondary | ICD-10-CM | POA: Diagnosis not present

## 2014-11-13 DIAGNOSIS — Z5181 Encounter for therapeutic drug level monitoring: Secondary | ICD-10-CM | POA: Diagnosis not present

## 2014-11-13 DIAGNOSIS — N390 Urinary tract infection, site not specified: Secondary | ICD-10-CM | POA: Diagnosis not present

## 2014-11-13 DIAGNOSIS — I739 Peripheral vascular disease, unspecified: Secondary | ICD-10-CM | POA: Diagnosis not present

## 2014-11-13 DIAGNOSIS — R32 Unspecified urinary incontinence: Secondary | ICD-10-CM | POA: Diagnosis not present

## 2014-11-13 DIAGNOSIS — I4891 Unspecified atrial fibrillation: Secondary | ICD-10-CM | POA: Diagnosis not present

## 2014-11-13 DIAGNOSIS — A499 Bacterial infection, unspecified: Secondary | ICD-10-CM | POA: Diagnosis not present

## 2014-11-13 DIAGNOSIS — J449 Chronic obstructive pulmonary disease, unspecified: Secondary | ICD-10-CM | POA: Diagnosis not present

## 2014-11-13 DIAGNOSIS — I872 Venous insufficiency (chronic) (peripheral): Secondary | ICD-10-CM | POA: Diagnosis not present

## 2014-11-13 DIAGNOSIS — Z7901 Long term (current) use of anticoagulants: Secondary | ICD-10-CM | POA: Diagnosis not present

## 2014-11-13 DIAGNOSIS — I82402 Acute embolism and thrombosis of unspecified deep veins of left lower extremity: Secondary | ICD-10-CM | POA: Diagnosis not present

## 2014-11-14 DIAGNOSIS — I4891 Unspecified atrial fibrillation: Secondary | ICD-10-CM | POA: Diagnosis not present

## 2014-11-14 DIAGNOSIS — I872 Venous insufficiency (chronic) (peripheral): Secondary | ICD-10-CM | POA: Diagnosis not present

## 2014-11-14 DIAGNOSIS — Z7901 Long term (current) use of anticoagulants: Secondary | ICD-10-CM | POA: Diagnosis not present

## 2014-11-14 DIAGNOSIS — J449 Chronic obstructive pulmonary disease, unspecified: Secondary | ICD-10-CM | POA: Diagnosis not present

## 2014-11-14 DIAGNOSIS — A499 Bacterial infection, unspecified: Secondary | ICD-10-CM | POA: Diagnosis not present

## 2014-11-14 DIAGNOSIS — I82402 Acute embolism and thrombosis of unspecified deep veins of left lower extremity: Secondary | ICD-10-CM | POA: Diagnosis not present

## 2014-11-14 DIAGNOSIS — N390 Urinary tract infection, site not specified: Secondary | ICD-10-CM | POA: Diagnosis not present

## 2014-11-14 DIAGNOSIS — R32 Unspecified urinary incontinence: Secondary | ICD-10-CM | POA: Diagnosis not present

## 2014-11-14 DIAGNOSIS — Z5181 Encounter for therapeutic drug level monitoring: Secondary | ICD-10-CM | POA: Diagnosis not present

## 2014-11-14 DIAGNOSIS — I739 Peripheral vascular disease, unspecified: Secondary | ICD-10-CM | POA: Diagnosis not present

## 2014-11-15 DIAGNOSIS — I82402 Acute embolism and thrombosis of unspecified deep veins of left lower extremity: Secondary | ICD-10-CM | POA: Diagnosis not present

## 2014-11-15 DIAGNOSIS — J449 Chronic obstructive pulmonary disease, unspecified: Secondary | ICD-10-CM | POA: Diagnosis not present

## 2014-11-15 DIAGNOSIS — N390 Urinary tract infection, site not specified: Secondary | ICD-10-CM | POA: Diagnosis not present

## 2014-11-15 DIAGNOSIS — A499 Bacterial infection, unspecified: Secondary | ICD-10-CM | POA: Diagnosis not present

## 2014-11-15 DIAGNOSIS — Z7901 Long term (current) use of anticoagulants: Secondary | ICD-10-CM | POA: Diagnosis not present

## 2014-11-15 DIAGNOSIS — Z5181 Encounter for therapeutic drug level monitoring: Secondary | ICD-10-CM | POA: Diagnosis not present

## 2014-11-15 DIAGNOSIS — I739 Peripheral vascular disease, unspecified: Secondary | ICD-10-CM | POA: Diagnosis not present

## 2014-11-15 DIAGNOSIS — I4891 Unspecified atrial fibrillation: Secondary | ICD-10-CM | POA: Diagnosis not present

## 2014-11-15 DIAGNOSIS — R32 Unspecified urinary incontinence: Secondary | ICD-10-CM | POA: Diagnosis not present

## 2014-11-15 DIAGNOSIS — I872 Venous insufficiency (chronic) (peripheral): Secondary | ICD-10-CM | POA: Diagnosis not present

## 2014-11-20 DIAGNOSIS — I82402 Acute embolism and thrombosis of unspecified deep veins of left lower extremity: Secondary | ICD-10-CM | POA: Diagnosis not present

## 2014-11-20 DIAGNOSIS — I4891 Unspecified atrial fibrillation: Secondary | ICD-10-CM | POA: Diagnosis not present

## 2014-11-20 DIAGNOSIS — Z7901 Long term (current) use of anticoagulants: Secondary | ICD-10-CM | POA: Diagnosis not present

## 2014-11-20 DIAGNOSIS — A499 Bacterial infection, unspecified: Secondary | ICD-10-CM | POA: Diagnosis not present

## 2014-11-20 DIAGNOSIS — J449 Chronic obstructive pulmonary disease, unspecified: Secondary | ICD-10-CM | POA: Diagnosis not present

## 2014-11-20 DIAGNOSIS — R32 Unspecified urinary incontinence: Secondary | ICD-10-CM | POA: Diagnosis not present

## 2014-11-20 DIAGNOSIS — I872 Venous insufficiency (chronic) (peripheral): Secondary | ICD-10-CM | POA: Diagnosis not present

## 2014-11-20 DIAGNOSIS — N390 Urinary tract infection, site not specified: Secondary | ICD-10-CM | POA: Diagnosis not present

## 2014-11-20 DIAGNOSIS — Z5181 Encounter for therapeutic drug level monitoring: Secondary | ICD-10-CM | POA: Diagnosis not present

## 2014-11-20 DIAGNOSIS — I739 Peripheral vascular disease, unspecified: Secondary | ICD-10-CM | POA: Diagnosis not present

## 2014-11-21 ENCOUNTER — Ambulatory Visit (INDEPENDENT_AMBULATORY_CARE_PROVIDER_SITE_OTHER): Payer: Medicare Other | Admitting: Pharmacist

## 2014-11-21 ENCOUNTER — Telehealth: Payer: Self-pay

## 2014-11-21 DIAGNOSIS — I82402 Acute embolism and thrombosis of unspecified deep veins of left lower extremity: Secondary | ICD-10-CM | POA: Diagnosis not present

## 2014-11-21 DIAGNOSIS — R32 Unspecified urinary incontinence: Secondary | ICD-10-CM | POA: Diagnosis not present

## 2014-11-21 DIAGNOSIS — Z5181 Encounter for therapeutic drug level monitoring: Secondary | ICD-10-CM | POA: Diagnosis not present

## 2014-11-21 DIAGNOSIS — N390 Urinary tract infection, site not specified: Secondary | ICD-10-CM | POA: Diagnosis not present

## 2014-11-21 DIAGNOSIS — J449 Chronic obstructive pulmonary disease, unspecified: Secondary | ICD-10-CM | POA: Diagnosis not present

## 2014-11-21 DIAGNOSIS — I4891 Unspecified atrial fibrillation: Secondary | ICD-10-CM | POA: Diagnosis not present

## 2014-11-21 DIAGNOSIS — I739 Peripheral vascular disease, unspecified: Secondary | ICD-10-CM | POA: Diagnosis not present

## 2014-11-21 DIAGNOSIS — I872 Venous insufficiency (chronic) (peripheral): Secondary | ICD-10-CM | POA: Diagnosis not present

## 2014-11-21 DIAGNOSIS — A499 Bacterial infection, unspecified: Secondary | ICD-10-CM | POA: Diagnosis not present

## 2014-11-21 DIAGNOSIS — Z7901 Long term (current) use of anticoagulants: Secondary | ICD-10-CM | POA: Diagnosis not present

## 2014-11-21 LAB — POCT INR: INR: 2

## 2014-11-21 NOTE — Telephone Encounter (Signed)
INR 2.0  5 mg daily and Tues., Thurs., Sat., and Sunday extra 1 mg

## 2014-11-21 NOTE — Telephone Encounter (Signed)
Therapeutic anticoagulation.  Continue current warfarin dose of 5mg  - take 1 tablet daily and take warfarin 1mg  on sundays, Tuesdays, thursdays and saturdays.  Recheck INR in 2 weeks.  [name] with [facility] notified and patient's wife notified.

## 2014-11-24 DIAGNOSIS — I739 Peripheral vascular disease, unspecified: Secondary | ICD-10-CM | POA: Diagnosis not present

## 2014-11-24 DIAGNOSIS — I4891 Unspecified atrial fibrillation: Secondary | ICD-10-CM | POA: Diagnosis not present

## 2014-11-24 DIAGNOSIS — N390 Urinary tract infection, site not specified: Secondary | ICD-10-CM | POA: Diagnosis not present

## 2014-11-24 DIAGNOSIS — I82402 Acute embolism and thrombosis of unspecified deep veins of left lower extremity: Secondary | ICD-10-CM | POA: Diagnosis not present

## 2014-11-24 DIAGNOSIS — I872 Venous insufficiency (chronic) (peripheral): Secondary | ICD-10-CM | POA: Diagnosis not present

## 2014-11-24 DIAGNOSIS — Z7901 Long term (current) use of anticoagulants: Secondary | ICD-10-CM | POA: Diagnosis not present

## 2014-11-24 DIAGNOSIS — A499 Bacterial infection, unspecified: Secondary | ICD-10-CM | POA: Diagnosis not present

## 2014-11-24 DIAGNOSIS — J449 Chronic obstructive pulmonary disease, unspecified: Secondary | ICD-10-CM | POA: Diagnosis not present

## 2014-11-24 DIAGNOSIS — Z5181 Encounter for therapeutic drug level monitoring: Secondary | ICD-10-CM | POA: Diagnosis not present

## 2014-11-24 DIAGNOSIS — R32 Unspecified urinary incontinence: Secondary | ICD-10-CM | POA: Diagnosis not present

## 2014-11-26 ENCOUNTER — Telehealth: Payer: Self-pay | Admitting: Family Medicine

## 2014-11-26 DIAGNOSIS — N390 Urinary tract infection, site not specified: Secondary | ICD-10-CM

## 2014-11-26 NOTE — Telephone Encounter (Signed)
Has frequent UTIs.  He is taking trimethoprin daily for UTI prevention.  He had an episode of incontinence yesterday and always has a urinary odor. Wife stated that urine looked different than normal. Daughter will bring by specimen for U/A and culture.

## 2014-11-26 NOTE — Telephone Encounter (Signed)
Unable to bring urine sample in today but will bring it in the morning.

## 2014-11-27 ENCOUNTER — Other Ambulatory Visit (INDEPENDENT_AMBULATORY_CARE_PROVIDER_SITE_OTHER): Payer: Medicare Other

## 2014-11-27 DIAGNOSIS — N390 Urinary tract infection, site not specified: Secondary | ICD-10-CM | POA: Diagnosis not present

## 2014-11-27 LAB — POCT UA - MICROSCOPIC ONLY
CASTS, UR, LPF, POC: NEGATIVE
Crystals, Ur, HPF, POC: NEGATIVE
Mucus, UA: NEGATIVE
Yeast, UA: NEGATIVE

## 2014-11-27 LAB — POCT URINALYSIS DIPSTICK
BILIRUBIN UA: NEGATIVE
GLUCOSE UA: NEGATIVE
Ketones, UA: NEGATIVE
Nitrite, UA: POSITIVE
Protein, UA: NEGATIVE
SPEC GRAV UA: 1.015
Urobilinogen, UA: NEGATIVE
pH, UA: 6

## 2014-11-27 MED ORDER — AMOXICILLIN-POT CLAVULANATE 875-125 MG PO TABS: 1.0000 | ORAL_TABLET | Freq: Two times a day (BID) | ORAL | Status: DC

## 2014-11-27 NOTE — Progress Notes (Signed)
Lab only 

## 2014-11-27 NOTE — Addendum Note (Signed)
Addended by: Gwenith DailyHUDY, Alto Gandolfo N on: 11/27/2014 03:05 PM   Modules accepted: Orders

## 2014-11-29 DIAGNOSIS — I82402 Acute embolism and thrombosis of unspecified deep veins of left lower extremity: Secondary | ICD-10-CM | POA: Diagnosis not present

## 2014-11-29 DIAGNOSIS — I872 Venous insufficiency (chronic) (peripheral): Secondary | ICD-10-CM | POA: Diagnosis not present

## 2014-11-29 DIAGNOSIS — Z5181 Encounter for therapeutic drug level monitoring: Secondary | ICD-10-CM | POA: Diagnosis not present

## 2014-11-29 DIAGNOSIS — R32 Unspecified urinary incontinence: Secondary | ICD-10-CM | POA: Diagnosis not present

## 2014-11-29 DIAGNOSIS — J449 Chronic obstructive pulmonary disease, unspecified: Secondary | ICD-10-CM | POA: Diagnosis not present

## 2014-11-29 DIAGNOSIS — I4891 Unspecified atrial fibrillation: Secondary | ICD-10-CM | POA: Diagnosis not present

## 2014-11-29 DIAGNOSIS — A499 Bacterial infection, unspecified: Secondary | ICD-10-CM | POA: Diagnosis not present

## 2014-11-29 DIAGNOSIS — N390 Urinary tract infection, site not specified: Secondary | ICD-10-CM | POA: Diagnosis not present

## 2014-11-29 DIAGNOSIS — Z7901 Long term (current) use of anticoagulants: Secondary | ICD-10-CM | POA: Diagnosis not present

## 2014-11-29 DIAGNOSIS — I739 Peripheral vascular disease, unspecified: Secondary | ICD-10-CM | POA: Diagnosis not present

## 2014-11-30 DIAGNOSIS — N390 Urinary tract infection, site not specified: Secondary | ICD-10-CM | POA: Diagnosis not present

## 2014-11-30 LAB — URINE CULTURE

## 2014-12-04 ENCOUNTER — Other Ambulatory Visit: Payer: Self-pay | Admitting: *Deleted

## 2014-12-04 MED ORDER — DOXYCYCLINE HYCLATE 100 MG PO TABS: 100.0000 mg | ORAL_TABLET | Freq: Two times a day (BID) | ORAL | Status: DC

## 2014-12-05 ENCOUNTER — Encounter: Payer: Self-pay | Admitting: *Deleted

## 2014-12-06 ENCOUNTER — Telehealth: Payer: Self-pay | Admitting: Pharmacist

## 2014-12-06 NOTE — Telephone Encounter (Signed)
Patient was due INR recheck either yesterday or today 12/06/14.  Called to verify that [facility] has plans to go out to check.  Left message for [facility]Maria.

## 2014-12-07 ENCOUNTER — Telehealth: Payer: Self-pay

## 2014-12-07 ENCOUNTER — Ambulatory Visit (INDEPENDENT_AMBULATORY_CARE_PROVIDER_SITE_OTHER): Payer: Medicare Other | Admitting: Pharmacist

## 2014-12-07 DIAGNOSIS — J449 Chronic obstructive pulmonary disease, unspecified: Secondary | ICD-10-CM | POA: Diagnosis not present

## 2014-12-07 DIAGNOSIS — R32 Unspecified urinary incontinence: Secondary | ICD-10-CM | POA: Diagnosis not present

## 2014-12-07 DIAGNOSIS — A499 Bacterial infection, unspecified: Secondary | ICD-10-CM | POA: Diagnosis not present

## 2014-12-07 DIAGNOSIS — I739 Peripheral vascular disease, unspecified: Secondary | ICD-10-CM | POA: Diagnosis not present

## 2014-12-07 DIAGNOSIS — Z5181 Encounter for therapeutic drug level monitoring: Secondary | ICD-10-CM | POA: Diagnosis not present

## 2014-12-07 DIAGNOSIS — N390 Urinary tract infection, site not specified: Secondary | ICD-10-CM | POA: Diagnosis not present

## 2014-12-07 DIAGNOSIS — I4891 Unspecified atrial fibrillation: Secondary | ICD-10-CM | POA: Diagnosis not present

## 2014-12-07 DIAGNOSIS — I82402 Acute embolism and thrombosis of unspecified deep veins of left lower extremity: Secondary | ICD-10-CM | POA: Diagnosis not present

## 2014-12-07 DIAGNOSIS — I872 Venous insufficiency (chronic) (peripheral): Secondary | ICD-10-CM | POA: Diagnosis not present

## 2014-12-07 DIAGNOSIS — Z7901 Long term (current) use of anticoagulants: Secondary | ICD-10-CM | POA: Diagnosis not present

## 2014-12-07 LAB — POCT INR: INR: 1.9

## 2014-12-07 NOTE — Telephone Encounter (Signed)
Take extra 1mg  warfarin today (will get 6mg  instead of 5mg ) then restart regular warfarin dose - 5mg  daily except extra  1mg  on tuesdays, thursdays, saturdays and sundays. Recheck in 1 week since taking doxycycline.  Discussed above and ordered protime with Inetta Fermoina - nurse with [facility].  She was in the home of patient so she relayed above to patient and his wife.

## 2014-12-07 NOTE — Telephone Encounter (Signed)
PT 22.7  INR  1.9  5 mg every day adds 1 mg Tuesday, Thurs. Sat and Sunday   Still on Doxycycline

## 2014-12-12 ENCOUNTER — Telehealth: Payer: Self-pay | Admitting: *Deleted

## 2014-12-12 ENCOUNTER — Ambulatory Visit (INDEPENDENT_AMBULATORY_CARE_PROVIDER_SITE_OTHER): Payer: Medicare Other | Admitting: Pharmacist

## 2014-12-12 DIAGNOSIS — J449 Chronic obstructive pulmonary disease, unspecified: Secondary | ICD-10-CM | POA: Diagnosis not present

## 2014-12-12 DIAGNOSIS — Z7901 Long term (current) use of anticoagulants: Secondary | ICD-10-CM | POA: Diagnosis not present

## 2014-12-12 DIAGNOSIS — I82402 Acute embolism and thrombosis of unspecified deep veins of left lower extremity: Secondary | ICD-10-CM | POA: Diagnosis not present

## 2014-12-12 DIAGNOSIS — I739 Peripheral vascular disease, unspecified: Secondary | ICD-10-CM | POA: Diagnosis not present

## 2014-12-12 DIAGNOSIS — A499 Bacterial infection, unspecified: Secondary | ICD-10-CM | POA: Diagnosis not present

## 2014-12-12 DIAGNOSIS — I872 Venous insufficiency (chronic) (peripheral): Secondary | ICD-10-CM | POA: Diagnosis not present

## 2014-12-12 DIAGNOSIS — Z5181 Encounter for therapeutic drug level monitoring: Secondary | ICD-10-CM | POA: Diagnosis not present

## 2014-12-12 DIAGNOSIS — R32 Unspecified urinary incontinence: Secondary | ICD-10-CM | POA: Diagnosis not present

## 2014-12-12 DIAGNOSIS — N390 Urinary tract infection, site not specified: Secondary | ICD-10-CM | POA: Diagnosis not present

## 2014-12-12 DIAGNOSIS — I4891 Unspecified atrial fibrillation: Secondary | ICD-10-CM | POA: Diagnosis not present

## 2014-12-12 LAB — POCT INR: INR: 2.1

## 2014-12-12 NOTE — Telephone Encounter (Signed)
INR therapeutic - restart usual dose of warfarin 5mg  take 1 tablet daily and warfarin 1mg  take 1 tablet on sundays, tuesdays, thursdays and saturdays. Notified Tine with [facility] and she was still at patient's home.  She instructed patient's wife on dose .  Recheck INRin 1 week.

## 2014-12-12 NOTE — Telephone Encounter (Signed)
Protime  25.6   INR 2.1

## 2014-12-12 NOTE — Progress Notes (Signed)
No charge for labs or visit - INR check through home health in patient's home.

## 2014-12-19 ENCOUNTER — Ambulatory Visit (INDEPENDENT_AMBULATORY_CARE_PROVIDER_SITE_OTHER): Payer: Medicare Other | Admitting: Pharmacist

## 2014-12-19 ENCOUNTER — Telehealth: Payer: Self-pay | Admitting: *Deleted

## 2014-12-19 DIAGNOSIS — I872 Venous insufficiency (chronic) (peripheral): Secondary | ICD-10-CM | POA: Diagnosis not present

## 2014-12-19 DIAGNOSIS — I82402 Acute embolism and thrombosis of unspecified deep veins of left lower extremity: Secondary | ICD-10-CM | POA: Diagnosis not present

## 2014-12-19 DIAGNOSIS — I4891 Unspecified atrial fibrillation: Secondary | ICD-10-CM | POA: Diagnosis not present

## 2014-12-19 DIAGNOSIS — Z5181 Encounter for therapeutic drug level monitoring: Secondary | ICD-10-CM | POA: Diagnosis not present

## 2014-12-19 DIAGNOSIS — A499 Bacterial infection, unspecified: Secondary | ICD-10-CM | POA: Diagnosis not present

## 2014-12-19 DIAGNOSIS — Z7901 Long term (current) use of anticoagulants: Secondary | ICD-10-CM | POA: Diagnosis not present

## 2014-12-19 DIAGNOSIS — I739 Peripheral vascular disease, unspecified: Secondary | ICD-10-CM | POA: Diagnosis not present

## 2014-12-19 DIAGNOSIS — J449 Chronic obstructive pulmonary disease, unspecified: Secondary | ICD-10-CM | POA: Diagnosis not present

## 2014-12-19 DIAGNOSIS — R32 Unspecified urinary incontinence: Secondary | ICD-10-CM | POA: Diagnosis not present

## 2014-12-19 DIAGNOSIS — N390 Urinary tract infection, site not specified: Secondary | ICD-10-CM | POA: Diagnosis not present

## 2014-12-19 LAB — POCT INR: INR: 2.3

## 2014-12-19 NOTE — Progress Notes (Signed)
[facility] and reported to our office.  No charge for INR today.

## 2014-12-19 NOTE — Telephone Encounter (Signed)
Protime 27.0  INR 2.3

## 2014-12-19 NOTE — Telephone Encounter (Signed)
Therpeutic anticoagulation.  Recommend continue 5 mg 1 tablet every day and warfarin 1mg  1 tablet on sundays, tuesdays, thursdays and saturdays. Recheck 12/31/14. Notified Tina with [facility] and patient's wife.

## 2014-12-20 DIAGNOSIS — N138 Other obstructive and reflux uropathy: Secondary | ICD-10-CM | POA: Diagnosis not present

## 2014-12-20 DIAGNOSIS — N401 Enlarged prostate with lower urinary tract symptoms: Secondary | ICD-10-CM | POA: Diagnosis not present

## 2014-12-20 DIAGNOSIS — N390 Urinary tract infection, site not specified: Secondary | ICD-10-CM | POA: Diagnosis not present

## 2014-12-20 DIAGNOSIS — R339 Retention of urine, unspecified: Secondary | ICD-10-CM | POA: Diagnosis not present

## 2014-12-22 ENCOUNTER — Emergency Department (HOSPITAL_COMMUNITY): Payer: Medicare Other

## 2014-12-22 ENCOUNTER — Inpatient Hospital Stay (HOSPITAL_COMMUNITY)
Admission: EM | Admit: 2014-12-22 | Discharge: 2014-12-26 | DRG: 872 | Disposition: A | Discharge status: Skilled Nursing Facility | Payer: Medicare Other | Attending: Internal Medicine | Admitting: Internal Medicine

## 2014-12-22 ENCOUNTER — Encounter (HOSPITAL_COMMUNITY): Payer: Self-pay | Admitting: Emergency Medicine

## 2014-12-22 DIAGNOSIS — I82402 Acute embolism and thrombosis of unspecified deep veins of left lower extremity: Secondary | ICD-10-CM | POA: Diagnosis not present

## 2014-12-22 DIAGNOSIS — R509 Fever, unspecified: Secondary | ICD-10-CM | POA: Diagnosis not present

## 2014-12-22 DIAGNOSIS — N401 Enlarged prostate with lower urinary tract symptoms: Secondary | ICD-10-CM | POA: Diagnosis not present

## 2014-12-22 DIAGNOSIS — R627 Adult failure to thrive: Secondary | ICD-10-CM | POA: Diagnosis not present

## 2014-12-22 DIAGNOSIS — I5032 Chronic diastolic (congestive) heart failure: Secondary | ICD-10-CM | POA: Diagnosis not present

## 2014-12-22 DIAGNOSIS — I872 Venous insufficiency (chronic) (peripheral): Secondary | ICD-10-CM | POA: Diagnosis not present

## 2014-12-22 DIAGNOSIS — M6281 Muscle weakness (generalized): Secondary | ICD-10-CM | POA: Diagnosis not present

## 2014-12-22 DIAGNOSIS — Z961 Presence of intraocular lens: Secondary | ICD-10-CM | POA: Diagnosis present

## 2014-12-22 DIAGNOSIS — Z9049 Acquired absence of other specified parts of digestive tract: Secondary | ICD-10-CM | POA: Diagnosis present

## 2014-12-22 DIAGNOSIS — I714 Abdominal aortic aneurysm, without rupture, unspecified: Secondary | ICD-10-CM | POA: Diagnosis present

## 2014-12-22 DIAGNOSIS — N12 Tubulo-interstitial nephritis, not specified as acute or chronic: Secondary | ICD-10-CM | POA: Diagnosis not present

## 2014-12-22 DIAGNOSIS — J449 Chronic obstructive pulmonary disease, unspecified: Secondary | ICD-10-CM | POA: Diagnosis not present

## 2014-12-22 DIAGNOSIS — F0390 Unspecified dementia without behavioral disturbance: Secondary | ICD-10-CM | POA: Diagnosis not present

## 2014-12-22 DIAGNOSIS — N200 Calculus of kidney: Secondary | ICD-10-CM | POA: Diagnosis not present

## 2014-12-22 DIAGNOSIS — Z885 Allergy status to narcotic agent status: Secondary | ICD-10-CM

## 2014-12-22 DIAGNOSIS — I482 Chronic atrial fibrillation: Secondary | ICD-10-CM | POA: Diagnosis not present

## 2014-12-22 DIAGNOSIS — M1990 Unspecified osteoarthritis, unspecified site: Secondary | ICD-10-CM | POA: Diagnosis present

## 2014-12-22 DIAGNOSIS — R338 Other retention of urine: Secondary | ICD-10-CM | POA: Diagnosis present

## 2014-12-22 DIAGNOSIS — Z9119 Patient's noncompliance with other medical treatment and regimen: Secondary | ICD-10-CM | POA: Diagnosis present

## 2014-12-22 DIAGNOSIS — N179 Acute kidney failure, unspecified: Secondary | ICD-10-CM | POA: Diagnosis not present

## 2014-12-22 DIAGNOSIS — I481 Persistent atrial fibrillation: Secondary | ICD-10-CM | POA: Diagnosis not present

## 2014-12-22 DIAGNOSIS — S0990XA Unspecified injury of head, initial encounter: Secondary | ICD-10-CM | POA: Diagnosis not present

## 2014-12-22 DIAGNOSIS — Z87891 Personal history of nicotine dependence: Secondary | ICD-10-CM | POA: Diagnosis not present

## 2014-12-22 DIAGNOSIS — R262 Difficulty in walking, not elsewhere classified: Secondary | ICD-10-CM | POA: Diagnosis not present

## 2014-12-22 DIAGNOSIS — N210 Calculus in bladder: Secondary | ICD-10-CM | POA: Diagnosis not present

## 2014-12-22 DIAGNOSIS — T8351XA Infection and inflammatory reaction due to indwelling urinary catheter, initial encounter: Secondary | ICD-10-CM | POA: Diagnosis present

## 2014-12-22 DIAGNOSIS — Y846 Urinary catheterization as the cause of abnormal reaction of the patient, or of later complication, without mention of misadventure at the time of the procedure: Secondary | ICD-10-CM | POA: Diagnosis present

## 2014-12-22 DIAGNOSIS — F068 Other specified mental disorders due to known physiological condition: Secondary | ICD-10-CM | POA: Diagnosis present

## 2014-12-22 DIAGNOSIS — I429 Cardiomyopathy, unspecified: Secondary | ICD-10-CM | POA: Diagnosis present

## 2014-12-22 DIAGNOSIS — M25552 Pain in left hip: Secondary | ICD-10-CM | POA: Diagnosis present

## 2014-12-22 DIAGNOSIS — R488 Other symbolic dysfunctions: Secondary | ICD-10-CM | POA: Diagnosis not present

## 2014-12-22 DIAGNOSIS — Z86718 Personal history of other venous thrombosis and embolism: Secondary | ICD-10-CM | POA: Diagnosis not present

## 2014-12-22 DIAGNOSIS — R296 Repeated falls: Secondary | ICD-10-CM | POA: Diagnosis present

## 2014-12-22 DIAGNOSIS — M4806 Spinal stenosis, lumbar region: Secondary | ICD-10-CM | POA: Diagnosis not present

## 2014-12-22 DIAGNOSIS — A419 Sepsis, unspecified organism: Principal | ICD-10-CM | POA: Diagnosis present

## 2014-12-22 DIAGNOSIS — S3991XA Unspecified injury of abdomen, initial encounter: Secondary | ICD-10-CM | POA: Diagnosis not present

## 2014-12-22 DIAGNOSIS — N10 Acute tubulo-interstitial nephritis: Secondary | ICD-10-CM

## 2014-12-22 DIAGNOSIS — Z7901 Long term (current) use of anticoagulants: Secondary | ICD-10-CM

## 2014-12-22 DIAGNOSIS — S3993XA Unspecified injury of pelvis, initial encounter: Secondary | ICD-10-CM | POA: Diagnosis not present

## 2014-12-22 DIAGNOSIS — Z66 Do not resuscitate: Secondary | ICD-10-CM | POA: Diagnosis present

## 2014-12-22 DIAGNOSIS — N390 Urinary tract infection, site not specified: Secondary | ICD-10-CM

## 2014-12-22 DIAGNOSIS — Z9842 Cataract extraction status, left eye: Secondary | ICD-10-CM

## 2014-12-22 DIAGNOSIS — R278 Other lack of coordination: Secondary | ICD-10-CM | POA: Diagnosis not present

## 2014-12-22 DIAGNOSIS — Z9841 Cataract extraction status, right eye: Secondary | ICD-10-CM | POA: Diagnosis not present

## 2014-12-22 DIAGNOSIS — Z9181 History of falling: Secondary | ICD-10-CM | POA: Diagnosis not present

## 2014-12-22 DIAGNOSIS — Z993 Dependence on wheelchair: Secondary | ICD-10-CM

## 2014-12-22 DIAGNOSIS — I739 Peripheral vascular disease, unspecified: Secondary | ICD-10-CM | POA: Diagnosis present

## 2014-12-22 DIAGNOSIS — I480 Paroxysmal atrial fibrillation: Secondary | ICD-10-CM | POA: Diagnosis present

## 2014-12-22 DIAGNOSIS — S299XXA Unspecified injury of thorax, initial encounter: Secondary | ICD-10-CM | POA: Diagnosis not present

## 2014-12-22 DIAGNOSIS — I4891 Unspecified atrial fibrillation: Secondary | ICD-10-CM | POA: Diagnosis not present

## 2014-12-22 DIAGNOSIS — H409 Unspecified glaucoma: Secondary | ICD-10-CM | POA: Diagnosis present

## 2014-12-22 HISTORY — DX: Acute pyelonephritis: N10

## 2014-12-22 HISTORY — DX: Benign prostatic hyperplasia with lower urinary tract symptoms: N40.1

## 2014-12-22 LAB — COMPREHENSIVE METABOLIC PANEL
ALBUMIN: 3.4 g/dL — AB (ref 3.5–5.0)
ALBUMIN: 3 g/dL — AB (ref 3.5–5.0)
ALT: 13 U/L — ABNORMAL LOW (ref 17–63)
ALT: 14 U/L — ABNORMAL LOW (ref 17–63)
ANION GAP: 6 (ref 5–15)
AST: 19 U/L (ref 15–41)
AST: 23 U/L (ref 15–41)
Alkaline Phosphatase: 66 U/L (ref 38–126)
Alkaline Phosphatase: 59 U/L (ref 38–126)
Anion gap: 7 (ref 5–15)
BUN: 14 mg/dL (ref 6–20)
BUN: 14 mg/dL (ref 6–20)
CALCIUM: 8.6 mg/dL — AB (ref 8.9–10.3)
CALCIUM: 8.4 mg/dL — AB (ref 8.9–10.3)
CO2: 27 mmol/L (ref 22–32)
CO2: 27 mmol/L (ref 22–32)
CREATININE: 1.26 mg/dL — AB (ref 0.61–1.24)
CREATININE: 1.25 mg/dL — AB (ref 0.61–1.24)
Chloride: 106 mmol/L (ref 101–111)
Chloride: 109 mmol/L (ref 101–111)
GFR calc Af Amer: 60 mL/min — ABNORMAL LOW (ref >60)
GFR calc non Af Amer: 52 mL/min — ABNORMAL LOW (ref >60)
GFR, EST NON AFRICAN AMERICAN: 52 mL/min — AB (ref >60)
GLUCOSE: 137 mg/dL — AB (ref 65–99)
Glucose, Bld: 127 mg/dL — ABNORMAL HIGH (ref 65–99)
Potassium: 3.9 mmol/L (ref 3.5–5.1)
Potassium: 4.6 mmol/L (ref 3.5–5.1)
SODIUM: 140 mmol/L (ref 135–145)
Sodium: 142 mmol/L (ref 135–145)
TOTAL PROTEIN: 6.6 g/dL (ref 6.5–8.1)
TOTAL PROTEIN: 6.3 g/dL — AB (ref 6.5–8.1)
Total Bilirubin: 1.5 mg/dL — ABNORMAL HIGH (ref 0.3–1.2)
Total Bilirubin: 1.3 mg/dL — ABNORMAL HIGH (ref 0.3–1.2)

## 2014-12-22 LAB — URINE MICROSCOPIC-ADD ON

## 2014-12-22 LAB — URINALYSIS, ROUTINE W REFLEX MICROSCOPIC
BILIRUBIN URINE: NEGATIVE
GLUCOSE, UA: NEGATIVE mg/dL
KETONES UR: NEGATIVE mg/dL
Nitrite: NEGATIVE
PH: 8.5 — AB (ref 5.0–8.0)
Protein, ur: 100 mg/dL — AB
Specific Gravity, Urine: 1.018 (ref 1.005–1.030)
Urobilinogen, UA: 1 mg/dL (ref 0.0–1.0)

## 2014-12-22 LAB — CBC WITH DIFFERENTIAL/PLATELET
BASOS PCT: 0 % (ref 0–1)
Basophils Absolute: 0 10*3/uL (ref 0.0–0.1)
Eosinophils Absolute: 0 10*3/uL (ref 0.0–0.7)
Eosinophils Relative: 0 % (ref 0–5)
HCT: 38 % — ABNORMAL LOW (ref 39.0–52.0)
HEMOGLOBIN: 12.2 g/dL — AB (ref 13.0–17.0)
LYMPHS PCT: 2 % — AB (ref 12–46)
Lymphs Abs: 0.3 10*3/uL — ABNORMAL LOW (ref 0.7–4.0)
MCH: 29 pg (ref 26.0–34.0)
MCHC: 32.1 g/dL (ref 30.0–36.0)
MCV: 90.5 fL (ref 78.0–100.0)
MONO ABS: 1.3 10*3/uL — AB (ref 0.1–1.0)
MONOS PCT: 10 % (ref 3–12)
NEUTROS PCT: 88 % — AB (ref 43–77)
Neutro Abs: 11.4 10*3/uL — ABNORMAL HIGH (ref 1.7–7.7)
Platelets: 220 10*3/uL (ref 150–400)
RBC: 4.2 MIL/uL — ABNORMAL LOW (ref 4.22–5.81)
RDW: 15.4 % (ref 11.5–15.5)
WBC: 13 10*3/uL — ABNORMAL HIGH (ref 4.0–10.5)

## 2014-12-22 LAB — CBC
HCT: 36.5 % — ABNORMAL LOW (ref 39.0–52.0)
Hemoglobin: 11.7 g/dL — ABNORMAL LOW (ref 13.0–17.0)
MCH: 29.3 pg (ref 26.0–34.0)
MCHC: 32.1 g/dL (ref 30.0–36.0)
MCV: 91.3 fL (ref 78.0–100.0)
Platelets: 227 10*3/uL (ref 150–400)
RBC: 4 MIL/uL — ABNORMAL LOW (ref 4.22–5.81)
RDW: 15.5 % (ref 11.5–15.5)
WBC: 18.8 10*3/uL — AB (ref 4.0–10.5)

## 2014-12-22 LAB — LACTIC ACID, PLASMA
LACTIC ACID, VENOUS: 0.8 mmol/L (ref 0.5–2.0)
Lactic Acid, Venous: 1.8 mmol/L (ref 0.5–2.0)

## 2014-12-22 LAB — PROTIME-INR
INR: 2.26 — ABNORMAL HIGH (ref 0.00–1.49)
PROTHROMBIN TIME: 24.7 s — AB (ref 11.6–15.2)

## 2014-12-22 LAB — I-STAT TROPONIN, ED: Troponin i, poc: 0.02 ng/mL (ref 0.00–0.08)

## 2014-12-22 LAB — I-STAT CG4 LACTIC ACID, ED
LACTIC ACID, VENOUS: 0.89 mmol/L (ref 0.5–2.0)
Lactic Acid, Venous: 1.28 mmol/L (ref 0.5–2.0)

## 2014-12-22 LAB — MRSA PCR SCREENING: MRSA by PCR: NEGATIVE

## 2014-12-22 MED ORDER — SODIUM CHLORIDE 0.9 % IV SOLN: INTRAVENOUS | Status: DC

## 2014-12-22 MED ORDER — PIPERACILLIN-TAZOBACTAM 3.375 G IVPB
3.3750 g | Freq: Three times a day (TID) | INTRAVENOUS | Status: DC
  Administered 2014-12-22 – 2014-12-25 (×8): 3.375 g via INTRAVENOUS
  Filled 2014-12-22 (×11): qty 50

## 2014-12-22 MED ORDER — LATANOPROST 0.005 % OP SOLN
1.0000 [drp] | Freq: Every day | OPHTHALMIC | Status: DC
  Administered 2014-12-22 – 2014-12-25 (×4): 1 [drp] via OPHTHALMIC
  Filled 2014-12-22: qty 2.5

## 2014-12-22 MED ORDER — SODIUM CHLORIDE 0.9 % IV BOLUS (SEPSIS)
1000.0000 mL | INTRAVENOUS | Status: AC
  Administered 2014-12-22 (×2): 1000 mL via INTRAVENOUS

## 2014-12-22 MED ORDER — FINASTERIDE 5 MG PO TABS
5.0000 mg | ORAL_TABLET | Freq: Every day | ORAL | Status: DC
  Administered 2014-12-22 – 2014-12-25 (×4): 5 mg via ORAL
  Filled 2014-12-22 (×4): qty 1

## 2014-12-22 MED ORDER — DOXAZOSIN MESYLATE 8 MG PO TABS
8.0000 mg | ORAL_TABLET | Freq: Every day | ORAL | Status: DC
  Administered 2014-12-22 – 2014-12-26 (×5): 8 mg via ORAL
  Filled 2014-12-22 (×2): qty 1
  Filled 2014-12-22 (×4): qty 8
  Filled 2014-12-22: qty 1

## 2014-12-22 MED ORDER — SODIUM CHLORIDE 0.9 % IV SOLN
INTRAVENOUS | Status: DC
  Administered 2014-12-23: 18:00:00 via INTRAVENOUS

## 2014-12-22 MED ORDER — PIPERACILLIN-TAZOBACTAM 3.375 G IVPB 30 MIN
3.3750 g | Freq: Once | INTRAVENOUS | Status: AC
  Administered 2014-12-22: 3.375 g via INTRAVENOUS
  Filled 2014-12-22: qty 50

## 2014-12-22 MED ORDER — SODIUM CHLORIDE 0.9 % IJ SOLN
3.0000 mL | Freq: Two times a day (BID) | INTRAMUSCULAR | Status: DC
  Administered 2014-12-22 – 2014-12-24 (×4): 3 mL via INTRAVENOUS

## 2014-12-22 MED ORDER — VANCOMYCIN HCL 10 G IV SOLR
1500.0000 mg | INTRAVENOUS | Status: AC
  Administered 2014-12-22: 1500 mg via INTRAVENOUS
  Filled 2014-12-22: qty 1500

## 2014-12-22 MED ORDER — HEPARIN SODIUM (PORCINE) 5000 UNIT/ML IJ SOLN
5000.0000 [IU] | Freq: Three times a day (TID) | INTRAMUSCULAR | Status: DC
  Administered 2014-12-22 – 2014-12-23 (×2): 5000 [IU] via SUBCUTANEOUS
  Filled 2014-12-22 (×2): qty 1

## 2014-12-22 MED ORDER — DILTIAZEM HCL ER COATED BEADS 120 MG PO CP24
120.0000 mg | ORAL_CAPSULE | Freq: Every day | ORAL | Status: DC
  Administered 2014-12-22: 120 mg via ORAL
  Filled 2014-12-22: qty 1

## 2014-12-22 MED ORDER — VANCOMYCIN HCL 10 G IV SOLR
1750.0000 mg | INTRAVENOUS | Status: DC
  Administered 2014-12-23: 1750 mg via INTRAVENOUS
  Filled 2014-12-22 (×2): qty 1750

## 2014-12-22 MED ORDER — SODIUM CHLORIDE 0.9 % IV SOLN
1000.0000 mL | INTRAVENOUS | Status: DC
  Administered 2014-12-22: 1000 mL via INTRAVENOUS

## 2014-12-22 MED ORDER — IOHEXOL 300 MG/ML  SOLN: 50.0000 mL | Freq: Once | INTRAMUSCULAR | Status: AC | PRN

## 2014-12-22 MED ORDER — ACETAMINOPHEN 325 MG PO TABS
650.0000 mg | ORAL_TABLET | Freq: Once | ORAL | Status: AC
  Administered 2014-12-22: 650 mg via ORAL
  Filled 2014-12-22: qty 2

## 2014-12-22 MED ORDER — VANCOMYCIN HCL IN DEXTROSE 1-5 GM/200ML-% IV SOLN
1000.0000 mg | Freq: Once | INTRAVENOUS | Status: AC
  Administered 2014-12-22: 1000 mg via INTRAVENOUS
  Filled 2014-12-22: qty 200

## 2014-12-22 NOTE — ED Notes (Signed)
PER EMS- pt picked up from home c/o hip pain and fall x2 days. Pt denies hip pain, no deformity noted. Pt also has bedsore that is possibly gangrene and malodorous on back.  Pt arrived to ED febrile, 102F.  Pt had foley catheter placed Thursday.  Hx of UTI. Alert and oriented per baseline. Pt has hx of dementia and a-fib. PTA 20g IV placed in RAC.

## 2014-12-22 NOTE — Progress Notes (Signed)
ANTIBIOTIC CONSULT NOTE - INITIAL  Pharmacy Consult for vancomycin, Zosyn Indication: rule out sepsis  Allergies  Allergen Reactions  . Oxycodone Hcl Other (See Comments)     makes pt "wild"  . Propoxyphene N-Acetaminophen Other (See Comments)     Makes pt. "wild"    Patient Measurements: Weight: (!) 319 lb 10.7 oz (145 kg)  Vital Signs: Temp: 102.6 F (39.2 C) (07/23 0940) Temp Source: Oral (07/23 0940) BP: 137/50 mmHg (07/23 1101) Pulse Rate: 102 (07/23 1101) Intake/Output from previous day:   Intake/Output from this shift:    Labs:  Recent Labs  12/22/14 1045  WBC 13.0*  HGB 12.2*  PLT 220  CREATININE 1.26*   Estimated Creatinine Clearance: 74.3 mL/min (by C-G formula based on Cr of 1.26). No results for input(s): VANCOTROUGH, VANCOPEAK, VANCORANDOM, GENTTROUGH, GENTPEAK, GENTRANDOM, TOBRATROUGH, TOBRAPEAK, TOBRARND, AMIKACINPEAK, AMIKACINTROU, AMIKACIN in the last 72 hours.   Microbiology: Recent Results (from the past 720 hour(s))  Urine culture     Status: Abnormal   Collection Time: 11/27/14 10:57 AM  Result Value Ref Range Status   Urine Culture, Routine Final report (A)  Final   Result 1 Staphylococcus aureus (A)  Final    Comment: Greater than 100,000 colony forming units per mL Methicillin resistant (MRSA) Based on resistance to oxacillin this isolate would be resistant to all currently available beta-lactam antimicrobial agents, with the exception of the newer cephalosporins with anti-MRSA activity, such as Ceftaroline    ANTIMICROBIAL SUSCEPTIBILITY Comment  Final    Comment:       ** S = Susceptible; I = Intermediate; R = Resistant **                    P = Positive; N = Negative             MICS are expressed in micrograms per mL    Antibiotic                 RSLT#1    RSLT#2    RSLT#3    RSLT#4 Ciprofloxacin                  R Gentamicin                     S Levofloxacin                   R Linezolid                      S Nitrofurantoin                  S Oxacillin                      R Penicillin                     R Rifampin                       S Tetracycline                   S Trimethoprim/Sulfa             R Vancomycin                     S     Medical History: Past Medical History  Diagnosis Date  . Venous insufficiency   .  AAA (abdominal aortic aneurysm)   . Ulcer   . DVT (deep venous thrombosis) 05/04/2013    "LLE"  . Cellulitis   . Nicotine dependence   . Bronchospasm   . Elevated PSA   . BPH (benign prostatic hyperplasia)   . COPD (chronic obstructive pulmonary disease)   . Cognitive impairment   . Daily headache   . Arthritis     "probably on the right leg/hip" (09/20/2013)  . Dementia     "don't know exactly what kind" (09/20/2013)  . Kidney stones   . Glaucoma, both eyes   . Atrial fibrillation   . Fall at home September 18, 2013    Medications:  Scheduled:   Infusions:  . sodium chloride     Assessment: 81 yo presents with hip pain after fall and fever. PMH includes dementia and hx DVT and afib on warfarin at a dose of  daily except  MWF per outpatient pharmacy notes. Per RN notes, patient also with bedsore and had foley catheter placed Thursday. To start vancomycin and Zosyn per pharmacy for possible sepsis  Tmax 102.6. WBC elevated. SCr 1.26 with est CrCl N 46  Goal of Therapy:  Vancomycin trough level 15-20 mcg/ml  Plan:  1) Vancomycin 1g IV x 1 in ER. Will give another  x 1 dose to =  total for loading dose. Start maintenance dose of  IV q24 thereafter 2) Zosyn 3.375g IV q8 (extended interval infusion)   Hessie Knows, PharmD, BCPS Pager [phone] 12/22/2014 12:25 PM

## 2014-12-22 NOTE — H&P (Signed)
[facility] History and Physical  [name] JSE:[id number] DOB: Sep 29, 1931 DOA: 12/22/2014  Referring physician: Dr. Wyvonnia Dusky PCP: [name], MD  Specialists: None  Chief Complaint:   19 ? Cognitive impairment +/- Normal press HC, dementia, left leg DVT 05/2013 on anticoagulatio-He has had a PE provoked by Femoral # reportedly in 1986,  SSS with RVR on chronic Coumadin,  Prior history Cardiomyopathy with recent EF 60 percent grade 1 diastolic dysfunction 0/73/71  AAA infrarenal~ 3.8 cm in 2009 followed by Vasc surgery, venous insufficiency with lower extremity swelling and wearing Unna boots, , prior severe spinal stenosis s/p Decompressive laminectomy l4-l5  COPD, falls; who presented with fever and generalized weakness  He has a history of BPH and his daughter reports that he was seen by urologist Dr. [name] just 2 days ago and  office placed Foley He was treated for pyelonephritis between 5/15-5/18 given IV antibiotics, sent home with by mouth Levaquin for 6 days and had a recurrent urinary tract infection 11/27/14 that was treated with Augmentin which was transitioned to doxycycline 100 mg on 7/5 and the last dose given was on 7/15   In the interim per the daughter he ran out of his doxazosin and finasteride and developed some retention and went to visit Dr. [name] and subsequent to this visit    over the course of 7/21 7/22 had increasing weakness His baseline is that he has had multiple falls and debility and was admitted to the [facility] earlier this year and has become progressively less mobile and is now wheelchair bound with transfers only from wheelchair to the bed He is alert and oriented and can tell me some of his history but seems to mask a mild dementia with jokes and does not answer directly when I asked him date day time or place He however contaminant he is at the hospital  - Chills, - blurred vision, - hematemesis, - melanoma, - nausea, - vomiting, - shortness  of breath,   Daughter does report he has had a cough without any sputum Hasn't had any unilateral weakness or any other issues   initially in the emergency room met sepsis criteria and with MAXIMUM TEMPERATURE102.6, Hr 108 Lactic acid was 1.2--->0.8, point-of-care troponin 0.02 BUN/creatinine 14/1.2 WBC 13.0 Hemoglobin 12.2 INR 2.2    Past Medical History  Diagnosis Date  . Venous insufficiency   . AAA (abdominal aortic aneurysm)   . Ulcer   . DVT (deep venous thrombosis) 05/04/2013    "LLE"  . Cellulitis   . Nicotine dependence   . Bronchospasm   . Elevated PSA   . BPH (benign prostatic hyperplasia)   . COPD (chronic obstructive pulmonary disease)   . Cognitive impairment   . Daily headache   . Arthritis     "probably on the right leg/hip" (09/20/2013)  . Dementia     "don't know exactly what kind" (09/20/2013)  . Kidney stones   . Glaucoma, both eyes   . Atrial fibrillation   . Fall at home September 18, 2013   Past Surgical History  Procedure Laterality Date  . Cataract extraction w/ intraocular lens  implant, bilateral Bilateral   . Insitu percutaneous pinning femur Right 1986  . Hip fracture surgery Right 2009  . Back surgery    . Posterior laminectomy / decompression lumbar spine  2004    Decompressive laminectomy unilaterally on the right at L4-5 with  . Femur fracture surgery Right 04/1985  . Cystoscopy w/ stone  manipulation  1970's    "once"  . Spine surgery    . Eye surgery    . Cholecystectomy  2011    Gall Bladder   Social History:  History   Social History Narrative    Allergies  Allergen Reactions  . Oxycodone Hcl Other (See Comments)     makes pt "wild"  . Propoxyphene N-Acetaminophen Other (See Comments)     Makes pt. "wild"    Family History  Problem Relation Age of Onset  . Aneurysm Brother   . Heart attack Brother     10-29-12  . Hypertension Brother   . Heart disease Brother     AAA  . Cancer Father     colon    Prior to  Admission medications   Medication Sig Start Date End Date Taking? Authorizing Provider  acetaminophen (TYLENOL) 500 MG tablet Take 1,000 mg by mouth every 6 (six) hours as needed (pain).   Yes Historical Provider, MD  diltiazem (CARDIZEM CD) 120 MG 24 hr capsule Take 120 mg by mouth daily.  10/10/14  Yes Historical Provider, MD  doxazosin (CARDURA) 8 MG tablet Take 8 mg by mouth daily.   Yes Historical Provider, MD  finasteride (PROSCAR) 5 MG tablet Take 5 mg by mouth at bedtime.    Yes Historical Provider, MD  furosemide (LASIX) 80 MG tablet Take 1 tablet by mouth daily. 10/22/14  Yes Historical Provider, MD  latanoprost (XALATAN) 0.005 % ophthalmic solution Place 1 drop into both eyes at bedtime.  01/25/12  Yes Historical Provider, MD  potassium chloride (K-DUR) 10 MEQ tablet Take 1 tablet (10 mEq total) by mouth daily. 10/22/14  Yes [name], MD  trimethoprim (TRIMPEX) 100 MG tablet Take 1 tablet (100 mg total) by mouth daily. 11/05/14  Yes [name], MD  warfarin (COUMADIN) 1 MG tablet Take 1 mg by mouth daily. Take one tablet by mouth on Sunday, Tuesday, Thursday, and Saturday with $RemoveBefor'5mg'JmYozdUcDvnn$  tablet that is taken daily   Yes Historical Provider, MD  warfarin (COUMADIN) 5 MG tablet Take 1 tablet (5 mg total) by mouth daily. Patient taking differently: Take 5 mg by mouth daily with supper. On Tues, Thurs, Sat and Sun pt takes additional 1 mg tablet 10/10/14  Yes [name], MD  ZINC OXIDE EX Apply 1 application topically once a week. Infused wrap for legs   Yes Historical Provider, MD  amoxicillin-clavulanate (AUGMENTIN) 875-125 MG per tablet Take 1 tablet by mouth 2 (two) times daily. Patient not taking: Reported on 12/22/2014 11/27/14   Chipper Herb, MD  doxycycline (VIBRA-TABS) 100 MG tablet Take 1 tablet (100 mg total) by mouth 2 (two) times daily. Patient not taking: Reported on 12/22/2014 12/04/14   Chipper Herb, MD   Physical Exam: Filed Vitals:   12/22/14 1101 12/22/14 1200  12/22/14 1239 12/22/14 1330  BP: 137/50  126/102 132/58  Pulse: 102  89 78  Temp:   101.6 F (38.7 C)   TempSrc:   Oral   Resp: $Remo'25  21 26  'XwdIn$ Weight:  145 kg (319 lb 10.7 oz)    SpO2: 93%  95% 96%    EOMI NCAT pleasant slightly confused however No JVD no bruit Thick neck Chest sounds clear with mild crackles posteriorly Abdomen soft nontender nondistended no CVA tenderness no rebound nor guarding   lower extremities are swollen with redness however there is no pitting edema Neurologically intact moving all 4 limbs equally however does not answer  all questions clearly  Labs  on Admission:  Basic Metabolic Panel:  Recent Labs Lab 12/22/14 1045  NA 140  K 3.9  CL 106  CO2 27  GLUCOSE 127*  BUN 14  CREATININE 1.26*  CALCIUM 8.6*   Liver Function Tests:  Recent Labs Lab 12/22/14 1045  AST 19  ALT 13*  ALKPHOS 66  BILITOT 1.5*  PROT 6.6  ALBUMIN 3.4*   No results for input(s): LIPASE, AMYLASE in the last 168 hours. No results for input(s): AMMONIA in the last 168 hours. CBC:  Recent Labs Lab 12/22/14 1045  WBC 13.0*  NEUTROABS 11.4*  HGB 12.2*  HCT 38.0*  MCV 90.5  PLT 220   Cardiac Enzymes: No results for input(s): CKTOTAL, CKMB, CKMBINDEX, TROPONINI in the last 168 hours.  BNP (last 3 results) No results for input(s): BNP in the last 8760 hours.  ProBNP (last 3 results) No results for input(s): PROBNP in the last 8760 hours.  CBG: No results for input(s): GLUCAP in the last 168 hours.  Radiological Exams on Admission: Ct Abdomen Pelvis Wo Contrast  12/22/2014   CLINICAL DATA:  Status post fall 2 days ago. Hip pain. Bed sore. Initial encounter.  EXAM: CT ABDOMEN AND PELVIS WITHOUT CONTRAST  TECHNIQUE: Multidetector CT imaging of the abdomen and pelvis was performed following the standard protocol without IV contrast.  COMPARISON:  CT abdomen and pelvis 03/19/2014.  FINDINGS: Dependent atelectasis is seen lung bases. There is no pleural or pericardial  effusion. Heart size is normal.  Bilateral renal cysts are identified and unchanged. 2 right and 1 left nonobstructing renal stone are identified. There is no hydronephrosis and no ureteral stones are seen. The urinary bladder is nearly completely decompressed with a Foley catheter in place. Stone within the urinary bladder measuring 2.6 cm in diameter is again seen as on the prior study. The prostate gland is enlarged.  The patient is status post cholecystectomy. The liver, spleen and adrenal glands appear normal. The pancreas is fatty replaced, unchanged.  Aortoiliac atherosclerosis is identified. 4.4 cm infrarenal abdominal aortic aneurysm is unchanged in appearance. No hemorrhage is identified. Small umbilical hernia is noted. The stomach, small and large bowel and appendix appear normal.  Healed right intertrochanteric fracture is seen with fixation hardware in place. No acute fracture is identified. Lumbar spondylosis appears most notable at L5-S1. No lytic or sclerotic bony lesion is seen.  IMPRESSION: No acute finding abdomen or pelvis.  Small bilateral nonobstructing renal stones.  Urinary bladder stone, unchanged compared the prior exam.  Prostatomegaly.  No change in a 4.4 cm infrarenal abdominal aortic aneurysm. Recommend followup by ultrasound in 1 year. This recommendation follows ACR consensus guidelines: White Paper of the ACR Incidental Findings Committee II on Vascular Findings. J Am Coll Radiol 2013; 10:789-794.   Electronically Signed   By: Inge Rise M.D.   On: 12/22/2014 12:47   Dg Chest 2 View  12/22/2014   CLINICAL DATA:  Status post fall 2 days ago with left hip pain. Initial encounter.  EXAM: CHEST  2 VIEW  COMPARISON:  PA and lateral chest 10/14/2014.  FINDINGS: The lungs are clear. Heart size is normal. No pneumothorax or pleural effusion is identified. No focal bony abnormality is seen.  IMPRESSION: No acute disease.   Electronically Signed   By: Inge Rise M.D.   On:  12/22/2014 12:24   Ct Head Wo Contrast  12/22/2014   CLINICAL DATA:  Fall, trauma  EXAM: CT HEAD WITHOUT  CONTRAST  TECHNIQUE: Contiguous axial images were obtained from the base of the skull through the vertex without intravenous contrast.  COMPARISON:  MRI 09/18/2013  FINDINGS: Mild cortical volume loss noted with disproportionate ventricular prominence which may suggest an element of hydrocephalus but is subjectively stable. Soft tissue material within the external auditory canals is compatible with cerumen. Mild ethmoid mucoperiosteal thickening. No osseous destruction. Areas of periventricular white matter hypodensity are most compatible with small vessel ischemic change. No acute hemorrhage, infarct, or mass lesion is identified. Encephalomalacia, left cerebellum, image 8.  IMPRESSION: No acute intracranial abnormality.  Proportional prominence of the ventricles as compared to the degree of cortical volume loss which may suggest an element of hydrocephalus but is stable.   Electronically Signed   By: Conchita Paris M.D.   On: 12/22/2014 12:41   Dg Hip Unilat With Pelvis 2-3 Views Left  12/22/2014   CLINICAL DATA:  Status post fall 2 days ago. Left hip pain. Initial encounter.  EXAM: DG HIP (WITH OR WITHOUT PELVIS) 2-3V LEFT  COMPARISON:  CT abdomen and pelvis 03/19/2014.  FINDINGS: No acute bony or joint abnormality is identified. The patient is status post fixation of a healed right intertrochanteric fracture. There is no notable degenerative change about the hips. Calcifications centrally in the pelvis correlates with the urinary bladder stone seen on the prior CT.  IMPRESSION: No acute abnormality.  Healed right intertrochanteric fracture.  Urinary bladder stone.   Electronically Signed   By: Inge Rise M.D.   On: 12/22/2014 12:26    EKG: Independently reviewsinus tachycardia PR interval 0.04 QRS axis -40 no ST-T wave changes Assessment/Plan Principal Problem:   Pyelonephritis,  acute -Complicated Pallo nephritis in setting of recent use of multiple antibiotics -New Foley catheter has been placed in the emergency room so we'll follow cultures from that specimen only -I will cc Dr. [name] of urology who is this gentleman see urologist on this patient as he was seen in his clinic just 2 days ago and had a Foley placed because of acute retention -Foley is draining well and I do not think he has hydronephrosis but he will definitely need his benign prostatic hyperplasia evaluated and options given to the family and the patient  Benign prostatic hypertrophy -At baseline the patient has declined and has multifactorial failure to thrive and is poorly mobile and dependent on most ADLs so I'm not sure if doing an operation on him would overall be beneficial in the long run -We will discuss with urologist and I will cc as above -Continue Cardura 8 mg daily, Proscar 5 mg every afternoon  Sepsis -Meets sepsis criterion however would not aggressively bolus him given his history of diastolic failure -Monitor on step down -Check lactic acid in the morning as it is already better -He is DO NOT RESUSCITATE but we will treat with antibiotics and review with family later    Peripheral vascular disease   COPD (chronic obstructive pulmonary disease)   AAA (abdominal aortic aneurysm) without rupture -Should follow up with Dr. early of vascular surgery as outpatient -Not sure if he needs anything other than active surveillance-    Multifactorial dementia -I do not see that the patient is on any type of dementia medication although he has a listed problem of this -Anticholinergic scan cause more complications so I would recommend as outpatient and this is reassessed with an MMSE and maybe low-dose  Namenda used as per PCP    Leg DVT (deep venous  thromboembolism) -Patient apparently had "Coumadin holiday" -He should continue on Coumadin long-term as he had a provoked DVT in 1986 and has  had subsequent DVTs -This of course need to be balanced with his fall risk however he is non-mobile for the most part wheelchair bound    Atrial fibrillation, paroxysmal, on Coumadin -Patient should continue his Cardizem 120 mg daily        DO NOT RESUSCITATE Discussed in detail with Blanch Media daughter at the bedside who is involved in healthcare decision making Admit to stepdown unit  CRITICAL CARE Performed by: Nita Sells   Total critical care time: 45  Critical care time was exclusive of separately billable procedures and treating other patients.  Critical care was necessary to treat or prevent imminent or life-threatening deterioration.  Critical care was time spent personally by me on the following activities: development of treatment plan with patient and/or surrogate as well as nursing, discussions with consultants, evaluation of patient's response to treatment, examination of patient, obtaining history from patient or surrogate, ordering and performing treatments and interventions, ordering and review of laboratory studies, ordering and review of radiographic studies, pulse oximetry and re-evaluation of patient's condition.  Time spent: 56  Nita Sells [facility] Pager [phone]  If 7PM-7AM, please contact night-coverage [email or web address] Password [facility] 12/22/2014, 2:05 PM

## 2014-12-22 NOTE — ED Provider Notes (Signed)
CSN: [id number]     Arrival date & time 12/22/14  4098 History   First MD Initiated Contact with Patient 12/22/14 [phone]     Chief Complaint  Patient presents with  . Hip Pain  . Fall  . Fever     (Consider location/radiation/quality/duration/timing/severity/associated sxs/prior Treatment) HPI Comments: Level V caveat for dementia. Patient from home. By report patient has had left hip pain for the past 2 days after a fall. Patient is confused, tachycardic, febrile. Has Foley catheter in place. He is oriented 2. He denies pain anywhere. History is unreliable due to dementia. Patient had a catheter placed 2 days ago. Family is also concerned that he has a bedsore. Patient on Coumadin for history of DVT. Also has a AAA, COPD, atrial fibrillation.  Patient is a 81 y.o. male presenting with fall and fever. The history is provided by the patient and the EMS personnel. The history is limited by the condition of the patient.  Fall  Fever   Past Medical History  Diagnosis Date  . Venous insufficiency   . AAA (abdominal aortic aneurysm)   . Ulcer   . DVT (deep venous thrombosis) 05/04/2013    "LLE"  . Cellulitis   . Nicotine dependence   . Bronchospasm   . Elevated PSA   . BPH (benign prostatic hyperplasia)   . COPD (chronic obstructive pulmonary disease)   . Cognitive impairment   . Daily headache   . Arthritis     "probably on the right leg/hip" (09/20/2013)  . Dementia     "don't know exactly what kind" (09/20/2013)  . Kidney stones   . Glaucoma, both eyes   . Atrial fibrillation   . Fall at home September 18, 2013   Past Surgical History  Procedure Laterality Date  . Cataract extraction w/ intraocular lens  implant, bilateral Bilateral   . Insitu percutaneous pinning femur Right 1986  . Hip fracture surgery Right 2009  . Back surgery    . Posterior laminectomy / decompression lumbar spine  2004    Decompressive laminectomy unilaterally on the right at L4-5 with  . Femur fracture  surgery Right 04/1985  . Cystoscopy w/ stone manipulation  1970's    "once"  . Spine surgery    . Eye surgery    . Cholecystectomy  2011    Gall Bladder   Family History  Problem Relation Age of Onset  . Aneurysm Brother   . Heart attack Brother     10-29-12  . Hypertension Brother   . Heart disease Brother     AAA  . Cancer Father     colon   History  Substance Use Topics  . Smoking status: Former Smoker -- 2.00 packs/day for 54 years    Types: Cigarettes    Quit date: 08/24/2002  . Smokeless tobacco: Current User    Types: Snuff     Comment: 09/20/2013 "dips snuff"  . Alcohol Use: No    Review of Systems  Unable to perform ROS: Dementia  Constitutional: Positive for fever.      Allergies  Oxycodone hcl and Propoxyphene n-acetaminophen  Home Medications   Prior to Admission medications   Medication Sig Start Date End Date Taking? Authorizing Provider  acetaminophen (TYLENOL) 500 MG tablet Take 1,000 mg by mouth every 6 (six) hours as needed (pain).   Yes Historical Provider, MD  diltiazem (CARDIZEM CD) 120 MG 24 hr capsule Take 120 mg by mouth daily.  10/10/14  Yes Historical  Provider, MD  doxazosin (CARDURA) 8 MG tablet Take 8 mg by mouth daily.   Yes Historical Provider, MD  finasteride (PROSCAR) 5 MG tablet Take 5 mg by mouth at bedtime.    Yes Historical Provider, MD  furosemide (LASIX) 80 MG tablet Take 1 tablet by mouth daily. 10/22/14  Yes Historical Provider, MD  latanoprost (XALATAN) 0.005 % ophthalmic solution Place 1 drop into both eyes at bedtime.  01/25/12  Yes Historical Provider, MD  potassium chloride (K-DUR) 10 MEQ tablet Take 1 tablet (10 mEq total) by mouth daily. 10/22/14  Yes Frederica Kuster, MD  trimethoprim (TRIMPEX) 100 MG tablet Take 1 tablet (100 mg total) by mouth daily. 11/05/14  Yes Frederica Kuster, MD  warfarin (COUMADIN) 1 MG tablet Take 1 mg by mouth daily. Take one tablet by mouth on Sunday, Tuesday, Thursday, and Saturday with 5mg   tablet that is taken daily   Yes Historical Provider, MD  warfarin (COUMADIN) 5 MG tablet Take 1 tablet (5 mg total) by mouth daily. Patient taking differently: Take 5 mg by mouth daily with supper. On Tues, Thurs, Sat and Sun pt takes additional 1 mg tablet 10/10/14  Yes Frederica Kuster, MD  ZINC OXIDE EX Apply 1 application topically once a week. Infused wrap for legs   Yes Historical Provider, MD  amoxicillin-clavulanate (AUGMENTIN) 875-125 MG per tablet Take 1 tablet by mouth 2 (two) times daily. Patient not taking: Reported on 12/22/2014 11/27/14   Ernestina Penna, MD  doxycycline (VIBRA-TABS) 100 MG tablet Take 1 tablet (100 mg total) by mouth 2 (two) times daily. Patient not taking: Reported on 12/22/2014 12/04/14   Ernestina Penna, MD   BP 135/50 mmHg  Pulse 75  Temp(Src) 99.2 F (37.3 C) (Oral)  Resp 21  Ht 6\' 6"  (1.981 m)  Wt 316 lb 5.8 oz (143.5 kg)  BMI 36.57 kg/m2  SpO2 95% Physical Exam  Constitutional: He appears well-developed and well-nourished. No distress.  Confused, oriented x2.  HENT:  Head: Normocephalic and atraumatic.  Mouth/Throat: Oropharynx is clear and moist. No oropharyngeal exudate.  Eyes: Conjunctivae and EOM are normal. Pupils are equal, round, and reactive to light.  Neck: Normal range of motion. Neck supple.  No meningismus.  Cardiovascular: Normal rate, normal heart sounds and intact distal pulses.   No murmur heard. Tachycardic  Pulmonary/Chest: Effort normal and breath sounds normal. No respiratory distress.  Diminished breath sounds throughout  Abdominal: Soft. There is no tenderness. There is no rebound and no guarding.  Genitourinary:  Scrotum is erythematous. Patient has balanitis with some white discharge near the glans. There is a Foley catheter in place.  Musculoskeletal: Normal range of motion. He exhibits tenderness. He exhibits no edema.  No T or L spine tenderness ROM of L hip without pain No decubitus ulcers seen  Neurological: He is  alert. No cranial nerve deficit. He exhibits normal muscle tone. Coordination normal.  CN 2-12 intact, moving all extremities equally  Skin: Skin is warm.  Psychiatric: He has a normal mood and affect. His behavior is normal.  Nursing note and vitals reviewed.   ED Course  Procedures (including critical care time) Labs Review Labs Reviewed  COMPREHENSIVE METABOLIC PANEL - Abnormal; Notable for the following:    Glucose, Bld 127 (*)    Creatinine, Ser 1.26 (*)    Calcium 8.6 (*)    Albumin 3.4 (*)    ALT 13 (*)    Total Bilirubin 1.5 (*)  GFR calc non Af Amer 52 (*)    GFR calc Af Amer 60 (*)    All other components within normal limits  CBC WITH DIFFERENTIAL/PLATELET - Abnormal; Notable for the following:    WBC 13.0 (*)    RBC 4.20 (*)    Hemoglobin 12.2 (*)    HCT 38.0 (*)    Neutrophils Relative % 88 (*)    Neutro Abs 11.4 (*)    Lymphocytes Relative 2 (*)    Lymphs Abs 0.3 (*)    Monocytes Absolute 1.3 (*)    All other components within normal limits  URINALYSIS, ROUTINE W REFLEX MICROSCOPIC (NOT AT [facility]) - Abnormal; Notable for the following:    Color, Urine AMBER (*)    APPearance TURBID (*)    pH 8.5 (*)    Hgb urine dipstick LARGE (*)    Protein, ur 100 (*)    Leukocytes, UA LARGE (*)    All other components within normal limits  PROTIME-INR - Abnormal; Notable for the following:    Prothrombin Time 24.7 (*)    INR 2.26 (*)    All other components within normal limits  URINE MICROSCOPIC-ADD ON - Abnormal; Notable for the following:    Bacteria, UA MANY (*)    All other components within normal limits  CBC - Abnormal; Notable for the following:    WBC 18.8 (*)    RBC 4.00 (*)    Hemoglobin 11.7 (*)    HCT 36.5 (*)    All other components within normal limits  COMPREHENSIVE METABOLIC PANEL - Abnormal; Notable for the following:    Glucose, Bld 137 (*)    Creatinine, Ser 1.25 (*)    Calcium 8.4 (*)    Total Protein 6.3 (*)    Albumin 3.0 (*)    ALT 14  (*)    Total Bilirubin 1.3 (*)    GFR calc non Af Amer 52 (*)    All other components within normal limits  MRSA PCR SCREENING  CULTURE, BLOOD (ROUTINE X 2)  CULTURE, BLOOD (ROUTINE X 2)  URINE CULTURE  URINE CULTURE  URINE CULTURE  LACTIC ACID, PLASMA  LACTIC ACID, PLASMA  COMPREHENSIVE METABOLIC PANEL  CBC  PROTIME-INR  I-STAT CG4 LACTIC ACID, ED  I-STAT TROPOININ, ED  I-STAT CG4 LACTIC ACID, ED    Imaging Review Ct Abdomen Pelvis Wo Contrast  12/22/2014   CLINICAL DATA:  Status post fall 2 days ago. Hip pain. Bed sore. Initial encounter.  EXAM: CT ABDOMEN AND PELVIS WITHOUT CONTRAST  TECHNIQUE: Multidetector CT imaging of the abdomen and pelvis was performed following the standard protocol without IV contrast.  COMPARISON:  CT abdomen and pelvis 03/19/2014.  FINDINGS: Dependent atelectasis is seen lung bases. There is no pleural or pericardial effusion. Heart size is normal.  Bilateral renal cysts are identified and unchanged. 2 right and 1 left nonobstructing renal stone are identified. There is no hydronephrosis and no ureteral stones are seen. The urinary bladder is nearly completely decompressed with a Foley catheter in place. Stone within the urinary bladder measuring 2.6 cm in diameter is again seen as on the prior study. The prostate gland is enlarged.  The patient is status post cholecystectomy. The liver, spleen and adrenal glands appear normal. The pancreas is fatty replaced, unchanged.  Aortoiliac atherosclerosis is identified. 4.4 cm infrarenal abdominal aortic aneurysm is unchanged in appearance. No hemorrhage is identified. Small umbilical hernia is noted. The stomach, small and large bowel and appendix appear  normal.  Healed right intertrochanteric fracture is seen with fixation hardware in place. No acute fracture is identified. Lumbar spondylosis appears most notable at L5-S1. No lytic or sclerotic bony lesion is seen.  IMPRESSION: No acute finding abdomen or pelvis.  Small  bilateral nonobstructing renal stones.  Urinary bladder stone, unchanged compared the prior exam.  Prostatomegaly.  No change in a 4.4 cm infrarenal abdominal aortic aneurysm. Recommend followup by ultrasound in 1 year. This recommendation follows ACR consensus guidelines: White Paper of the ACR Incidental Findings Committee II on Vascular Findings. J Am Coll Radiol 2013; 10:789-794.   Electronically Signed   By: Drusilla Kanner M.D.   On: 12/22/2014 12:47   Dg Chest 2 View  12/22/2014   CLINICAL DATA:  Status post fall 2 days ago with left hip pain. Initial encounter.  EXAM: CHEST  2 VIEW  COMPARISON:  PA and lateral chest 10/14/2014.  FINDINGS: The lungs are clear. Heart size is normal. No pneumothorax or pleural effusion is identified. No focal bony abnormality is seen.  IMPRESSION: No acute disease.   Electronically Signed   By: Drusilla Kanner M.D.   On: 12/22/2014 12:24   Ct Head Wo Contrast  12/22/2014   CLINICAL DATA:  Fall, trauma  EXAM: CT HEAD WITHOUT CONTRAST  TECHNIQUE: Contiguous axial images were obtained from the base of the skull through the vertex without intravenous contrast.  COMPARISON:  MRI 09/18/2013  FINDINGS: Mild cortical volume loss noted with disproportionate ventricular prominence which may suggest an element of hydrocephalus but is subjectively stable. Soft tissue material within the external auditory canals is compatible with cerumen. Mild ethmoid mucoperiosteal thickening. No osseous destruction. Areas of periventricular white matter hypodensity are most compatible with small vessel ischemic change. No acute hemorrhage, infarct, or mass lesion is identified. Encephalomalacia, left cerebellum, image 8.  IMPRESSION: No acute intracranial abnormality.  Proportional prominence of the ventricles as compared to the degree of cortical volume loss which may suggest an element of hydrocephalus but is stable.   Electronically Signed   By: Christiana Pellant M.D.   On: 12/22/2014 12:41    Dg Hip Unilat With Pelvis 2-3 Views Left  12/22/2014   CLINICAL DATA:  Status post fall 2 days ago. Left hip pain. Initial encounter.  EXAM: DG HIP (WITH OR WITHOUT PELVIS) 2-3V LEFT  COMPARISON:  CT abdomen and pelvis 03/19/2014.  FINDINGS: No acute bony or joint abnormality is identified. The patient is status post fixation of a healed right intertrochanteric fracture. There is no notable degenerative change about the hips. Calcifications centrally in the pelvis correlates with the urinary bladder stone seen on the prior CT.  IMPRESSION: No acute abnormality.  Healed right intertrochanteric fracture.  Urinary bladder stone.   Electronically Signed   By: Drusilla Kanner M.D.   On: 12/22/2014 12:26     EKG Interpretation   Date/Time:  Saturday December 22 2014 09:54:28 EDT Ventricular Rate:  107 PR Interval:  195 QRS Duration: 120 QT Interval:  330 QTC Calculation: 440 R Axis:   -27 Text Interpretation:  Sinus tachycardia Nonspecific intraventricular  conduction delay Borderline ST depression, lateral leads Rate faster  Confirmed by Manus Gunning  MD, Dameisha Tschida [phone]) on 12/22/2014 10:05:17 AM      MDM   Final diagnoses:  Sepsis, due to unspecified organism  Urinary tract infection without hematuria, site unspecified   Patient presents with altered mental status, fever, tachycardia and left hip pain after a fall. Code sepsis activated and on arrival.  Labs, culture, antibiotics.  IVF. Suspect urinary source with catheter in place.  Will change catheter.  L hip xray negative.  CT head negative. Lactate normal and BP stable. IV bolus reduced given history of diastolic CHF. CT abdomen obtained given scortal cellulitis.  No gas bubbles seen.  Treat for UTI, scrotal cellulitis.  D?w DR. Samtani  CRITICAL CARE Performed by: Glynn Octave Total critical care time: 40 Critical care time was exclusive of separately billable procedures and treating other patients. Critical care was  necessary to treat or prevent imminent or life-threatening deterioration. Critical care was time spent personally by me on the following activities: development of treatment plan with patient and/or surrogate as well as nursing, discussions with consultants, evaluation of patient's response to treatment, examination of patient, obtaining history from patient or surrogate, ordering and performing treatments and interventions, ordering and review of laboratory studies, ordering and review of radiographic studies, pulse oximetry and re-evaluation of patient's condition.     Glynn Octave, MD 12/22/14 [phone]

## 2014-12-22 NOTE — ED Notes (Signed)
Bed: WA04 Expected date: 12/22/14 Expected time: 9:34 AM Means of arrival: Ambulance Comments: Fall yesterday, hip pain

## 2014-12-22 NOTE — ED Notes (Signed)
Called floor at 13:45 and can go at 14:15...klj

## 2014-12-23 ENCOUNTER — Encounter (HOSPITAL_COMMUNITY): Payer: Self-pay | Admitting: Urology

## 2014-12-23 DIAGNOSIS — N401 Enlarged prostate with lower urinary tract symptoms: Secondary | ICD-10-CM

## 2014-12-23 DIAGNOSIS — R338 Other retention of urine: Secondary | ICD-10-CM | POA: Diagnosis present

## 2014-12-23 DIAGNOSIS — N210 Calculus in bladder: Secondary | ICD-10-CM | POA: Diagnosis present

## 2014-12-23 HISTORY — DX: Calculus in bladder: N21.0

## 2014-12-23 HISTORY — DX: Other retention of urine: R33.8

## 2014-12-23 HISTORY — DX: Benign prostatic hyperplasia with lower urinary tract symptoms: N40.1

## 2014-12-23 LAB — CBC
HCT: 36.2 % — ABNORMAL LOW (ref 39.0–52.0)
HEMOGLOBIN: 11.4 g/dL — AB (ref 13.0–17.0)
MCH: 29.2 pg (ref 26.0–34.0)
MCHC: 31.5 g/dL (ref 30.0–36.0)
MCV: 92.6 fL (ref 78.0–100.0)
Platelets: 206 10*3/uL (ref 150–400)
RBC: 3.91 MIL/uL — ABNORMAL LOW (ref 4.22–5.81)
RDW: 15.8 % — ABNORMAL HIGH (ref 11.5–15.5)
WBC: 14.2 10*3/uL — ABNORMAL HIGH (ref 4.0–10.5)

## 2014-12-23 LAB — COMPREHENSIVE METABOLIC PANEL
ALBUMIN: 2.8 g/dL — AB (ref 3.5–5.0)
ALK PHOS: 57 U/L (ref 38–126)
ALT: 11 U/L — AB (ref 17–63)
AST: 15 U/L (ref 15–41)
Anion gap: 5 (ref 5–15)
BILIRUBIN TOTAL: 1 mg/dL (ref 0.3–1.2)
BUN: 16 mg/dL (ref 6–20)
CO2: 24 mmol/L (ref 22–32)
Calcium: 8.4 mg/dL — ABNORMAL LOW (ref 8.9–10.3)
Chloride: 111 mmol/L (ref 101–111)
Creatinine, Ser: 1.14 mg/dL (ref 0.61–1.24)
GFR calc Af Amer: >60 mL/min (ref >60)
GFR, EST NON AFRICAN AMERICAN: 58 mL/min — AB (ref >60)
Glucose, Bld: 92 mg/dL (ref 65–99)
POTASSIUM: 4 mmol/L (ref 3.5–5.1)
SODIUM: 140 mmol/L (ref 135–145)
Total Protein: 5.9 g/dL — ABNORMAL LOW (ref 6.5–8.1)

## 2014-12-23 LAB — PROTIME-INR
INR: 2.34 — ABNORMAL HIGH (ref 0.00–1.49)
Prothrombin Time: 25.4 seconds — ABNORMAL HIGH (ref 11.6–15.2)

## 2014-12-23 MED ORDER — METOPROLOL TARTRATE 25 MG PO TABS
12.5000 mg | ORAL_TABLET | Freq: Two times a day (BID) | ORAL | Status: DC
  Administered 2014-12-23 – 2014-12-26 (×6): 12.5 mg via ORAL
  Filled 2014-12-23 (×6): qty 1

## 2014-12-23 MED ORDER — WARFARIN - PHARMACIST DOSING INPATIENT: Freq: Every day | Status: DC

## 2014-12-23 MED ORDER — DILTIAZEM HCL 30 MG PO TABS
30.0000 mg | ORAL_TABLET | Freq: Four times a day (QID) | ORAL | Status: DC
  Administered 2014-12-23 – 2014-12-26 (×12): 30 mg via ORAL
  Filled 2014-12-23 (×13): qty 1

## 2014-12-23 MED ORDER — DILTIAZEM HCL ER COATED BEADS 120 MG PO CP24
120.0000 mg | ORAL_CAPSULE | Freq: Every day | ORAL | Status: DC
  Administered 2014-12-23: 120 mg via ORAL
  Filled 2014-12-23: qty 1

## 2014-12-23 MED ORDER — METOPROLOL TARTRATE 1 MG/ML IV SOLN
INTRAVENOUS | Status: AC
  Administered 2014-12-23: 2.5 mg
  Filled 2014-12-23: qty 5

## 2014-12-23 MED ORDER — WARFARIN SODIUM 6 MG PO TABS
6.0000 mg | ORAL_TABLET | Freq: Once | ORAL | Status: AC
  Administered 2014-12-23: 6 mg via ORAL
  Filled 2014-12-23: qty 1

## 2014-12-23 NOTE — Progress Notes (Signed)
Patient with a fib with HR in the 130-140's, Dr. Mahala Menghini notified of rapid A fib and patient given extra 120 mg po Cardizem CD this am.  HR continues to be rapid a fib and patient given 12.5 Lopressor IV given with HR then in the 120's.  Dr. Mahala Menghini notified of the patient's rate, and patient to receive 30 mg po Cardizem per order.  Continue to monitor patient closely.  Omya Winfield Debroah Loop RN

## 2014-12-23 NOTE — Progress Notes (Signed)
Patient ID: [name], male   DOB: Sep 20, 1931, 81 y.o.   MRN: [id number]    Subjective: [name] is admitted for sepsis.   I saw him for the first time in a year and half for urinary retention on 7/21.  He had not had his BPH med refills and had a progressive decline in his voiding.  His UA in the office was unremarkable.  His cultures are pending from this admission.  He had a CT done which shows a large prostate and a 2.6cm bladder stone.   He is tolerating the foley and the urine is clear.  ROS:  Review of Systems  Constitutional: Positive for fever.  Neurological: Positive for focal weakness (he is unable to stand) and weakness.  All other systems reviewed and are negative.  Allergies  Allergen Reactions  . Oxycodone Hcl Other (See Comments)     makes pt "wild"  . Propoxyphene N-Acetaminophen Other (See Comments)     Makes pt. "wild"    Past Medical History  Diagnosis Date  . Venous insufficiency   . AAA (abdominal aortic aneurysm)   . Ulcer   . DVT (deep venous thrombosis) 05/04/2013    "LLE"  . Cellulitis   . Nicotine dependence   . Bronchospasm   . Elevated PSA   . BPH (benign prostatic hyperplasia)   . COPD (chronic obstructive pulmonary disease)   . Cognitive impairment   . Daily headache   . Arthritis     "probably on the right leg/hip" (09/20/2013)  . Dementia     "don't know exactly what kind" (09/20/2013)  . Kidney stones   . Glaucoma, both eyes   . Atrial fibrillation   . Fall at home September 18, 2013  . Benign localized hyperplasia of prostate with urinary retention 12/23/2014    Past Surgical History  Procedure Laterality Date  . Cataract extraction w/ intraocular lens  implant, bilateral Bilateral   . Insitu percutaneous pinning femur Right 1986  . Hip fracture surgery Right 2009  . Back surgery    . Posterior laminectomy / decompression lumbar spine  2004    Decompressive laminectomy unilaterally on the right at L4-5 with  . Femur fracture surgery  Right 04/1985  . Cystoscopy w/ stone manipulation  1970's    "once"  . Spine surgery    . Eye surgery    . Cholecystectomy  2011    Gall Bladder    History   Social History  . Marital Status: Married    Spouse Name: N/A  . Number of Children: N/A  . Years of Education: N/A   Occupational History  . Not on file.   Social History Main Topics  . Smoking status: Former Smoker -- 2.00 packs/day for 54 years    Types: Cigarettes    Quit date: 08/24/2002  . Smokeless tobacco: Current User    Types: Snuff     Comment: 09/20/2013 "dips snuff"  . Alcohol Use: No  . Drug Use: No  . Sexual Activity: Not Currently   Other Topics Concern  . Not on file   Social History Narrative    Family History  Problem Relation Age of Onset  . Aneurysm Brother   . Heart attack Brother     10-29-12  . Hypertension Brother   . Heart disease Brother     AAA  . Cancer Father     colon    Anti-infectives: Anti-infectives    Start  Dose/Rate Route Frequency Ordered Stop   12/23/14 1400  vancomycin (VANCOCIN) 1,750 mg in sodium chloride 0.9 % 500 mL IVPB     1,750 mg 250 mL/hr over 120 Minutes Intravenous Every 24 hours 12/22/14 1227     12/22/14 1800  piperacillin-tazobactam (ZOSYN) IVPB 3.375 g     3.375 g 12.5 mL/hr over 240 Minutes Intravenous Every 8 hours 12/22/14 1227     12/22/14 1230  vancomycin (VANCOCIN) 1,500 mg in sodium chloride 0.9 % 500 mL IVPB     1,500 mg 250 mL/hr over 120 Minutes Intravenous STAT 12/22/14 1227 12/22/14 1447   12/22/14 1015  piperacillin-tazobactam (ZOSYN) IVPB 3.375 g     3.375 g 100 mL/hr over 30 Minutes Intravenous  Once 12/22/14 1001 12/22/14 1046   12/22/14 1015  vancomycin (VANCOCIN) IVPB 1000 mg/200 mL premix     1,000 mg 200 mL/hr over 60 Minutes Intravenous  Once 12/22/14 1001 12/22/14 1137      Current Facility-Administered Medications  Medication Dose Route Frequency Provider Last Rate Last Dose  . 0.9 %  sodium chloride infusion    Intravenous Continuous Rhetta Mura, MD      . diltiazem (CARDIZEM CD) 24 hr capsule 120 mg  120 mg Oral Daily Rhetta Mura, MD   120 mg at 12/23/14 0754  . diltiazem (CARDIZEM) tablet 30 mg  30 mg Oral 4 times per day Rhetta Mura, MD   30 mg at 12/23/14 1005  . doxazosin (CARDURA) tablet 8 mg  8 mg Oral Daily Rhetta Mura, MD   8 mg at 12/23/14 1042  . finasteride (PROSCAR) tablet 5 mg  5 mg Oral QHS Rhetta Mura, MD   5 mg at 12/22/14 2300  . latanoprost (XALATAN) 0.005 % ophthalmic solution 1 drop  1 drop Both Eyes QHS Rhetta Mura, MD   1 drop at 12/22/14 2300  . metoprolol tartrate (LOPRESSOR) tablet 12.5 mg  12.5 mg Oral BID Rhetta Mura, MD   12.5 mg at 12/23/14 1039  . piperacillin-tazobactam (ZOSYN) IVPB 3.375 g  3.375 g Intravenous Q8H Hessie Knows, RPH   3.375 g at 12/23/14 0931  . sodium chloride 0.9 % injection 3 mL  3 mL Intravenous Q12H Rhetta Mura, MD   3 mL at 12/23/14 1039  . vancomycin (VANCOCIN) 1,750 mg in sodium chloride 0.9 % 500 mL IVPB  1,750 mg Intravenous Q24H Hessie Knows, RPH      . warfarin (COUMADIN) tablet 6 mg  6 mg Oral ONCE-1800 Rhetta Mura, MD      . Warfarin - Pharmacist Dosing Inpatient   Does not apply [id number] Rhetta Mura, MD   0  at 12/23/14 1800     Objective: Vital signs in last 24 hours: Temp:  [98.6 F (37 C)-101.6 F (38.7 C)] 98.8 F (37.1 C) (07/24 0800) Pulse Rate:  [69-153] 117 (07/24 1100) Resp:  [19-26] 19 (07/24 1100) BP: (102-135)/(43-102) 119/75 mmHg (07/24 1000) SpO2:  [93 %-96 %] 94 % (07/24 1100) Weight:  [143.5 kg (316 lb 5.8 oz)-145 kg (319 lb 10.7 oz)] 145 kg (319 lb 10.7 oz) (07/24 0424)  Intake/Output from previous day: 07/23 0701 - 07/24 0700 In: 2165 [P.O.:240; I.V.:1475; IV Piggyback:100] Out: 450 [Urine:450] Intake/Output this shift: Total I/O In: 797.5 [P.O.:560; I.V.:200; IV Piggyback:37.5] Out: 600 [Urine:600]   Physical Exam   Constitutional: He is well-developed, well-nourished, and in no distress.  Vitals reviewed.   Lab Results:   Recent Labs  12/22/14 1550 12/23/14 0415  WBC  18.8* 14.2*  HGB 11.7* 11.4*  HCT 36.5* 36.2*  PLT 227 206   BMET  Recent Labs  12/22/14 1550 12/23/14 0415  NA 142 140  K 4.6 4.0  CL 109 111  CO2 27 24  GLUCOSE 137* 92  BUN 14 16  CREATININE 1.25* 1.14  CALCIUM 8.4* 8.4*   PT/INR  Recent Labs  12/22/14 1045 12/23/14 0415  LABPROT 24.7* 25.4*  INR 2.26* 2.34*   ABG No results for input(s): PHART, HCO3 in the last 72 hours.  Invalid input(s): PCO2, PO2  Studies/Results: Ct Abdomen Pelvis Wo Contrast  12/22/2014   CLINICAL DATA:  Status post fall 2 days ago. Hip pain. Bed sore. Initial encounter.  EXAM: CT ABDOMEN AND PELVIS WITHOUT CONTRAST  TECHNIQUE: Multidetector CT imaging of the abdomen and pelvis was performed following the standard protocol without IV contrast.  COMPARISON:  CT abdomen and pelvis 03/19/2014.  FINDINGS: Dependent atelectasis is seen lung bases. There is no pleural or pericardial effusion. Heart size is normal.  Bilateral renal cysts are identified and unchanged. 2 right and 1 left nonobstructing renal stone are identified. There is no hydronephrosis and no ureteral stones are seen. The urinary bladder is nearly completely decompressed with a Foley catheter in place. Stone within the urinary bladder measuring 2.6 cm in diameter is again seen as on the prior study. The prostate gland is enlarged.  The patient is status post cholecystectomy. The liver, spleen and adrenal glands appear normal. The pancreas is fatty replaced, unchanged.  Aortoiliac atherosclerosis is identified. 4.4 cm infrarenal abdominal aortic aneurysm is unchanged in appearance. No hemorrhage is identified. Small umbilical hernia is noted. The stomach, small and large bowel and appendix appear normal.  Healed right intertrochanteric fracture is seen with fixation hardware in  place. No acute fracture is identified. Lumbar spondylosis appears most notable at L5-S1. No lytic or sclerotic bony lesion is seen.  IMPRESSION: No acute finding abdomen or pelvis.  Small bilateral nonobstructing renal stones.  Urinary bladder stone, unchanged compared the prior exam.  Prostatomegaly.  No change in a 4.4 cm infrarenal abdominal aortic aneurysm. Recommend followup by ultrasound in 1 year. This recommendation follows ACR consensus guidelines: White Paper of the ACR Incidental Findings Committee II on Vascular Findings. [place] Am Coll Radiol 2013; 10:789-794.   Electronically Signed   By: Drusilla Kanner M.D.   On: 12/22/2014 12:47   Dg Chest 2 View  12/22/2014   CLINICAL DATA:  Status post fall 2 days ago with left hip pain. Initial encounter.  EXAM: CHEST  2 VIEW  COMPARISON:  PA and lateral chest 10/14/2014.  FINDINGS: The lungs are clear. Heart size is normal. No pneumothorax or pleural effusion is identified. No focal bony abnormality is seen.  IMPRESSION: No acute disease.   Electronically Signed   By: Drusilla Kanner M.D.   On: 12/22/2014 12:24   Ct Head Wo Contrast  12/22/2014   CLINICAL DATA:  Fall, trauma  EXAM: CT HEAD WITHOUT CONTRAST  TECHNIQUE: Contiguous axial images were obtained from the base of the skull through the vertex without intravenous contrast.  COMPARISON:  MRI 09/18/2013  FINDINGS: Mild cortical volume loss noted with disproportionate ventricular prominence which may suggest an element of hydrocephalus but is subjectively stable. Soft tissue material within the external auditory canals is compatible with cerumen. Mild ethmoid mucoperiosteal thickening. No osseous destruction. Areas of periventricular white matter hypodensity are most compatible with small vessel ischemic change. No acute hemorrhage, infarct, or mass lesion  is identified. Encephalomalacia, left cerebellum, image 8.  IMPRESSION: No acute intracranial abnormality.  Proportional prominence of the ventricles  as compared to the degree of cortical volume loss which may suggest an element of hydrocephalus but is stable.   Electronically Signed   By: Christiana Pellant M.D.   On: 12/22/2014 12:41   Dg Hip Unilat With Pelvis 2-3 Views Left  12/22/2014   CLINICAL DATA:  Status post fall 2 days ago. Left hip pain. Initial encounter.  EXAM: DG HIP (WITH OR WITHOUT PELVIS) 2-3V LEFT  COMPARISON:  CT abdomen and pelvis 03/19/2014.  FINDINGS: No acute bony or joint abnormality is identified. The patient is status post fixation of a healed right intertrochanteric fracture. There is no notable degenerative change about the hips. Calcifications centrally in the pelvis correlates with the urinary bladder stone seen on the prior CT.  IMPRESSION: No acute abnormality.  Healed right intertrochanteric fracture.  Urinary bladder stone.   Electronically Signed   By: Drusilla Kanner M.D.   On: 12/22/2014 12:26   I have reviewed his labs and CT films.  I have discussed his case with Dr. Mahala Menghini.  Assessment and Plan: Benign localized hyperplasia of prostate with urinary retention He has a long history of BPH with BOO and had been on finasteride and doxazosin, but he had not been seen by me since 1/15 and his meds were not refilled.  He developed progressive voiding difficulty and had a failed foley attempt last week.  He then was seen by me on 7/21 and a foley was replaced for a 500mg  PVR.  His UA was unremarkable then.  His meds were reordered.    Bladder calculus He had a CT this admission which shows a large prostate and a 2.6cm bladder stone.  The stone has been present since at least 2011 and has grown from 1.6cm at that time.  He didn't have the stone when I first saw him in 2008 as best I can tell and I didn't do any bladder imaging myself so I was unaware that it had been present.    He will need to have the stone removed possibly in conjunction with a TURP when he recovers from his current episode and if he is felt to  otherwise be an acceptable surgical risk.        LOS: 1 day    [name] 12/23/2014

## 2014-12-23 NOTE — Progress Notes (Signed)
[name] UJW:[id number] DOB: Jan 30, 1932 DOA: 12/22/2014 PCP: Frederica Kuster, MD  Brief narrative: 21 ? Cognitive impairment +/- Normal press HC, dementia, left leg DVT 05/2013 on anticoagulatio-He has had a PE provoked by Femoral # reportedly in 1986, SSS with RVR on chronic Coumadin, Prior history Cardiomyopathy with recent EF 60 percent grade 1 diastolic dysfunction 10/15/14 AAA infrarenal~ 3.8 cm in 2009 followed by Vasc surgery, venous insufficiency with lower extremity swelling and wearing Unna boots, , prior severe spinal stenosis s/p Decompressive laminectomy l4-l5 COPD, falls; who presented with fever and generalized weakness  Past medical history-As per Problem list Chart reviewed as below-   Consultants:  Dr. [facility]  Procedures:  Ct abd pelv No acute finding abdomen or pelvis. Small bilateral nonobstructing renal stones. Urinary bladder stone, unchanged compared the prior exam. Prostatomegaly. No change in a 4.4 cm infrarenal abdominal aortic aneurysm. Recommend followup by ultrasound in 1 year. This recommendation  Antibiotics:  Vancomycin 7/23  Zosyn 7/23   Subjective   Oriented and alert much more so than yesterday Eating and drinking somewhat No CP RN informs me that HR 140's sust No n/v   Objective    Interim History: CT abd pelv  Telemetry: Afib sus 140's   Objective: Filed Vitals:   12/23/14 0400 12/23/14 0424 12/23/14 0600 12/23/14 0800  BP: 126/45   102/78  Pulse: 77  69 140  Temp:  98.6 F (37 C)    TempSrc:  Oral    Resp: 21  22 20   Height:      Weight:  145 kg (319 lb 10.7 oz)    SpO2: 96%  94% 94%    Intake/Output Summary (Last 24 hours) at 12/23/14 0824 Last data filed at 12/23/14 0800  Gross per 24 hour  Intake   2385 ml  Output    800 ml  Net   1585 ml    Exam:  EOMI NCAT pleasant slightly confused however No JVD no bruit Thick neck Chest sounds clear with mild crackles posteriorly Abdomen soft  nontender nondistended no CVA tenderness no rebound nor guarding  lower extremities are swollen with redness however there is no pitting edema Neurologically intact moving all 4 limbs equally however does not answer all questions clearly  Data Reviewed: Basic Metabolic Panel:  Recent Labs Lab 12/22/14 1045 12/22/14 1550 12/23/14 0415  NA 140 142 140  K 3.9 4.6 4.0  CL 106 109 111  CO2 27 27 24   GLUCOSE 127* 137* 92  BUN 14 14 16   CREATININE 1.26* 1.25* 1.14  CALCIUM 8.6* 8.4* 8.4*   Liver Function Tests:  Recent Labs Lab 12/22/14 1045 12/22/14 1550 12/23/14 0415  AST 19 23 15   ALT 13* 14* 11*  ALKPHOS 66 59 57  BILITOT 1.5* 1.3* 1.0  PROT 6.6 6.3* 5.9*  ALBUMIN 3.4* 3.0* 2.8*   No results for input(s): LIPASE, AMYLASE in the last 168 hours. No results for input(s): AMMONIA in the last 168 hours. CBC:  Recent Labs Lab 12/22/14 1045 12/22/14 1550 12/23/14 0415  WBC 13.0* 18.8* 14.2*  NEUTROABS 11.4*  --   --   HGB 12.2* 11.7* 11.4*  HCT 38.0* 36.5* 36.2*  MCV 90.5 91.3 92.6  PLT 220 227 206   Cardiac Enzymes: No results for input(s): CKTOTAL, CKMB, CKMBINDEX, TROPONINI in the last 168 hours. BNP: Invalid input(s): POCBNP CBG: No results for input(s): GLUCAP in the last 168 hours.  Recent Results (from the past 240 hour(s))  MRSA PCR Screening     Status: None   Collection Time: 12/22/14  9:40 AM  Result Value Ref Range Status   MRSA by PCR NEGATIVE NEGATIVE Final    Comment:        The GeneXpert MRSA Assay (FDA approved for NASAL specimens only), is one component of a comprehensive MRSA colonization surveillance program. It is not intended to diagnose MRSA infection nor to guide or monitor treatment for MRSA infections.      Studies:              All Imaging reviewed and is as per above notation   Scheduled Meds: . sodium chloride   Intravenous STAT  . diltiazem  120 mg Oral Daily  . doxazosin  8 mg Oral Daily  . finasteride  5 mg  Oral QHS  . heparin  5,000 Units Subcutaneous 3 times per day  . latanoprost  1 drop Both Eyes QHS  . piperacillin-tazobactam (ZOSYN)  IV  3.375 g Intravenous Q8H  . sodium chloride  3 mL Intravenous Q12H  . vancomycin  1,750 mg Intravenous Q24H   Continuous Infusions: . sodium chloride 1,000 mL (12/23/14 0200)  . sodium chloride       Assessment/Plan:  Pyelonephritis, acutelikely secondary to BPH and noncompliance on medications -Complicated in setting of recent use of multiple antibiotics -New Foley catheter has been placed in the emergency room so we'll follow cultures from that specimen only -urologist input appreciated. -options regarding prostatectomy pending -At baseline the patient has declined and has multifactorial failure to thrive and is poorly mobile and dependent on most ADLs so I'm not sure if doing an operation on him would overall be beneficial in the long run -Continue Cardura 8 mg daily, Proscar 5 mg every afternoon  Sepsis -Meets sepsis criterion however would not aggressively bolus him given his history of diastolic failure -Monitor on step down -Check lactic acid in the morning as it is already better -He is DO NOT RESUSCITATE but we will treat with antibiotics and review with family later  acute kidney injury -baseline BUN/creatinine above 88 GFR -On admissionGFR was 60 -Careful fluid repletion given history of grade 1 diastolic dysfunction on echocardiogram 10/15/14  euvolemic diastolic heart failure -As above -Not on any ACE inhibitor nor beta blocker -Readdressed as an outpatient  AAA (abdominal aortic aneurysm) without rupture -Should follow up with Dr. early of vascular surgery as outpatient -Not sure if he needs anything other than active surveillance-   Multifactorial dementia -I do not see that the patient is on any type of dementia medication although he has a listed problem of this -Anticholinergic scan cause more complications so I would  recommend as outpatient and this is reassessed with an MMSE and maybe low-dose  Namenda used as per PCP  Leg DVT (deep venous thromboembolism) -Patient apparently had "Coumadin holiday" -He should continue on Coumadin long-term as he had a provoked DVT in 1986 and has had subsequent DVTs -This of course need to be balanced with his fall risk however he is non-mobile for the most part wheelchair bound  Persistent atrial fibrillation -Flipped into RVR a.m. 7/24 -Dose Cardizem 120 mg daily now and reevaluate -Might need up titration in dose -Monitor on SDU for now    Peripheral vascular disease  COPD (chronic obstructive pulmonary disease)  Code Status: DNR Family Communication: none at bedside Disposition Plan:  SDU   Pleas Koch, MD  [facility] Pager [phone] 12/23/2014, 8:24 AM  LOS: 1 day

## 2014-12-23 NOTE — Consult Note (Signed)
WOC wound consult note Reason for Consult: Bilateral venous insufficiency with ulceration (CEAP 6) at left lateral malleolus, moisture associated skin damage (MASD), specifically intertriginous dermatitis (ITD) at the inguinal folds and at the scrotum Wound type:MASD, CVI with ulceration, 3cm x 3.5cm area of ecchymosis from fall noted previous to this admission at the right buttocks (not a DTPI). Pressure Ulcer POA: No Measurement: CVI ulcer, LLE (lateral): 3cm x 2cm x 0.4cm with pale pink base small amount light yellow exudate.  Followed by Dr. Laroy Apple as an outpatient for this problem.  LEs with edema, chronic skin changes and superficial areas of skin loss.  MASD of the Perirectal, inguinal skin folds and scrotum: specifically intertriginous dermatitis with red, moist and scant serous exudate.  Bruise from fall on right upper buttock measures 3 x 3.5cm and is fading purple to yellow. Wound bed:As described above Drainage (amount, consistency, odor) As described above Periwound:Bilateral LEs with scattered superficial areas of partial thickness tissue loss. Dressing procedure/placement/frequency: I will cover LLE ulcer with a soft silicone foam dressing and reinstate bilateral Unna's boots applied by ortho tech and changed weekly. We will elevate LEs and encourage side to side repositioning.  He is on a CLP sleep surface with low air loss therapy. I will add our house antimicrobial textile ([employer]+) for placement and moisture management in the inguinal skin folds and beneath the scrotum for use as a "sling". Patient is provided with a pressure redistribution chair cushion when he is OOB in chair with LEs elevated. WOC nursing team will not follow, but will remain available to this patient, the nursing and medical team.  Please re-consult if needed. Thanks, Ladona Mow, MSN, RN, GNP, [place], CWON-AP [phone])

## 2014-12-23 NOTE — Assessment & Plan Note (Addendum)
Mr. Bair is tolerating the foley and his urine is clear.   He has some leakage around the foley, possibly from spasm, but the foley is draining.    He is to be discharged to the [facility] today.    I discussed the options with him for treatment of the retention and stone included continued catheter drainage longer term, catheter drainage for the next 3-4 weeks with a voiding trial after he has had time for the finasteride to start working again or TURP with cystolithalopaxy.    At this time I will have him set up to see me at our [place] office in 3-4 weeks for a voiding trial.

## 2014-12-23 NOTE — Plan of Care (Signed)
Problem: Phase I Progression Outcomes Goal: Voiding-avoid urinary catheter unless indicated Outcome: Not Applicable Date Met:  36/85/99 Pt has foley for urinary retention

## 2014-12-23 NOTE — Assessment & Plan Note (Addendum)
He had a CT this admission which shows a large prostate and a 2.6cm bladder stone.  The stone has been present since at least 2011 and has grown from 1.6cm at that time.  He didn't have the stone when I first saw him in 2008 as best I can tell and I didn't do any bladder imaging myself so I was unaware that it had been present.    Unless he improves significantly and becomes a better surgical candidate, I will just monitor the stone.

## 2014-12-23 NOTE — Progress Notes (Signed)
ANTICOAGULATION CONSULT NOTE - Initial Consult  Pharmacy Consult for Coumadin Indication: History of A.fib and DVT  Allergies  Allergen Reactions  . Oxycodone Hcl Other (See Comments)     makes pt "wild"  . Propoxyphene N-Acetaminophen Other (See Comments)     Makes pt. "wild"    Patient Measurements: Height:  (198.1 cm) Weight: (!) 319 lb 10.7 oz (145 kg) IBW/kg (Calculated) : 91.4  Vital Signs: Temp: 98.8 F (37.1 C) (07/24 0800) Temp Source: Oral (07/24 0800) BP: 120/64 mmHg (07/24 0921) Pulse Rate: 153 (07/24 0921)  Labs:  Recent Labs  12/22/14 1045 12/22/14 1550 12/23/14 0415  HGB 12.2* 11.7* 11.4*  HCT 38.0* 36.5* 36.2*  PLT 220 227 206  LABPROT 24.7*  --  25.4*  INR 2.26*  --  2.34*  CREATININE 1.26* 1.25* 1.14    Estimated Creatinine Clearance: 81.1 mL/min (by C-G formula based on Cr of 1.14).   Medical History: Past Medical History  Diagnosis Date  . Venous insufficiency   . AAA (abdominal aortic aneurysm)   . Ulcer   . DVT (deep venous thrombosis) 05/04/2013    "LLE"  . Cellulitis   . Nicotine dependence   . Bronchospasm   . Elevated PSA   . BPH (benign prostatic hyperplasia)   . COPD (chronic obstructive pulmonary disease)   . Cognitive impairment   . Daily headache   . Arthritis     "probably on the right leg/hip" (09/20/2013)  . Dementia     "don't know exactly what kind" (09/20/2013)  . Kidney stones   . Glaucoma, both eyes   . Atrial fibrillation   . Fall at home September 18, 2013    Assessment: 81yo obese M admitted with fever and hip pain after falling at home. Treating for possible sepsis. Xray was negative for fracture. CT of the head was negative for hemorrhage. Pharmacy is asked to dose Coumadin while inpatient. Home dose reported as  daily except  MWF, last taken on 7/22.    INR is therapeutic on admission.  CBC is wnl. No bleeding reported/documented.  Drug interactions: broad-spectrum abx can increase  sensitivity to Coumadin.  Tolerating regular diet.  Goal of Therapy:  INR 2-3 Monitor platelets by anticoagulation protocol: Yes   Plan:  DC prophylactic heparin. Give Coumadin  at 1800. Check PT/INR daily.  Charolotte Eke, PharmD, pager [phone]. 12/23/2014,9:37 AM.

## 2014-12-24 LAB — COMPREHENSIVE METABOLIC PANEL
ALT: 13 U/L — AB (ref 17–63)
AST: 16 U/L (ref 15–41)
Albumin: 3 g/dL — ABNORMAL LOW (ref 3.5–5.0)
Alkaline Phosphatase: 51 U/L (ref 38–126)
Anion gap: 5 (ref 5–15)
BUN: 20 mg/dL (ref 6–20)
CALCIUM: 8.3 mg/dL — AB (ref 8.9–10.3)
CO2: 24 mmol/L (ref 22–32)
CREATININE: 1.2 mg/dL (ref 0.61–1.24)
Chloride: 112 mmol/L — ABNORMAL HIGH (ref 101–111)
GFR calc Af Amer: >60 mL/min (ref >60)
GFR calc non Af Amer: 55 mL/min — ABNORMAL LOW (ref >60)
GLUCOSE: 103 mg/dL — AB (ref 65–99)
POTASSIUM: 4 mmol/L (ref 3.5–5.1)
Sodium: 141 mmol/L (ref 135–145)
TOTAL PROTEIN: 6 g/dL — AB (ref 6.5–8.1)
Total Bilirubin: 0.8 mg/dL (ref 0.3–1.2)

## 2014-12-24 LAB — URINE CULTURE: Culture: 80000

## 2014-12-24 LAB — CBC
HCT: 32.3 % — ABNORMAL LOW (ref 39.0–52.0)
Hemoglobin: 10.4 g/dL — ABNORMAL LOW (ref 13.0–17.0)
MCH: 29.5 pg (ref 26.0–34.0)
MCHC: 32.2 g/dL (ref 30.0–36.0)
MCV: 91.8 fL (ref 78.0–100.0)
PLATELETS: 203 10*3/uL (ref 150–400)
RBC: 3.52 MIL/uL — ABNORMAL LOW (ref 4.22–5.81)
RDW: 16 % — AB (ref 11.5–15.5)
WBC: 8.5 10*3/uL (ref 4.0–10.5)

## 2014-12-24 LAB — PROTIME-INR
INR: 2.25 — ABNORMAL HIGH (ref 0.00–1.49)
Prothrombin Time: 24.6 seconds — ABNORMAL HIGH (ref 11.6–15.2)

## 2014-12-24 MED ORDER — WARFARIN SODIUM 5 MG PO TABS
5.0000 mg | ORAL_TABLET | Freq: Once | ORAL | Status: AC
  Administered 2014-12-24: 5 mg via ORAL
  Filled 2014-12-24: qty 1

## 2014-12-24 NOTE — Progress Notes (Signed)
ANTICOAGULATION CONSULT NOTE - Follow Up Consult  Pharmacy Consult for warfarin Indication: Hx DVT, Afib  Allergies  Allergen Reactions  . Oxycodone Hcl Other (See Comments)     makes pt "wild"  . Propoxyphene N-Acetaminophen Other (See Comments)     Makes pt. "wild"    Patient Measurements: Height:  (198.1 cm) Weight: (!) 324 lb 8.3 oz (147.2 kg) IBW/kg (Calculated) : 91.4  Vital Signs: Temp: 98.5 F (36.9 C) (07/25 0750) Temp Source: Oral (07/25 0750) BP: 118/72 mmHg (07/25 0800) Pulse Rate: 73 (07/25 0800)  Labs:  Recent Labs  12/22/14 1045 12/22/14 1550 12/23/14 0415 12/24/14 0406  HGB 12.2* 11.7* 11.4* 10.4*  HCT 38.0* 36.5* 36.2* 32.3*  PLT 220 227 206 203  LABPROT 24.7*  --  25.4* 24.6*  INR 2.26*  --  2.34* 2.25*  CREATININE 1.26* 1.25* 1.14 1.20    Estimated Creatinine Clearance: 77.6 mL/min (by C-G formula based on Cr of 1.2).   Medications:  Prescriptions prior to admission  Medication Sig Dispense Refill Last Dose  . acetaminophen (TYLENOL) 500 MG tablet Take 1,000 mg by mouth every 6 (six) hours as needed (pain).   12/21/2014 at Unknown time  . diltiazem (CARDIZEM CD) 120 MG 24 hr capsule Take 120 mg by mouth daily.    12/21/2014 at Unknown time  . doxazosin (CARDURA) 8 MG tablet Take 8 mg by mouth daily.   12/21/2014 at Unknown time  . finasteride (PROSCAR) 5 MG tablet Take 5 mg by mouth at bedtime.    12/21/2014 at Unknown time  . furosemide (LASIX) 80 MG tablet Take 1 tablet by mouth daily.   12/21/2014 at Unknown time  . latanoprost (XALATAN) 0.005 % ophthalmic solution Place 1 drop into both eyes at bedtime.    12/21/2014 at Unknown time  . potassium chloride (K-DUR) 10 MEQ tablet Take 1 tablet (10 mEq total) by mouth daily. 30 tablet 5 12/21/2014 at Unknown time  . trimethoprim (TRIMPEX) 100 MG tablet Take 1 tablet (100 mg total) by mouth daily. 30 tablet 1 12/21/2014 at Unknown time  . warfarin (COUMADIN) 1 MG tablet Take 1 mg by mouth daily.  Take one tablet by mouth on Sunday, Tuesday, Thursday, and Saturday with  tablet that is taken daily   12/20/2014  . warfarin (COUMADIN) 5 MG tablet Take 1 tablet (5 mg total) by mouth daily. (Patient taking differently: Take 5 mg by mouth daily with supper. On Tues, Thurs, Sat and Sun pt takes additional 1 mg tablet) 30 tablet 2 12/21/2014 at 1800  . ZINC OXIDE EX Apply 1 application topically once a week. Infused wrap for legs   12/19/2014  . amoxicillin-clavulanate (AUGMENTIN) 875-125 MG per tablet Take 1 tablet by mouth 2 (two) times daily. (Patient not taking: Reported on 12/22/2014) 20 tablet 0   . doxycycline (VIBRA-TABS) 100 MG tablet Take 1 tablet (100 mg total) by mouth 2 (two) times daily. (Patient not taking: Reported on 12/22/2014) 20 tablet 0    Scheduled:  . diltiazem  30 mg Oral 4 times per day  . doxazosin  8 mg Oral Daily  . finasteride  5 mg Oral QHS  . latanoprost  1 drop Both Eyes QHS  . metoprolol tartrate  12.5 mg Oral BID  . piperacillin-tazobactam (ZOSYN)  IV  3.375 g Intravenous Q8H  . sodium chloride  3 mL Intravenous Q12H  . Warfarin - Pharmacist Dosing Inpatient   Does not apply q1800    Assessment: 81yo obese  M admitted with fever and hip pain after falling at home. Treating for possible sepsis. Xray was negative for fracture. CT of the head was negative for hemorrhage. Pharmacy is asked to dose warfarin while inpatient.   Baseline INR: therapeutic  Prior anticoagulation: warfarin 6 mg daily except 5 mg MWF, last dose 7/22  Significant events:  Today, 12/24/2014:  CBC: Hgb low but appears stable  INR remains therapeutic after warfarin x 1  Major drug interactions: none  No bleeding issues per nursing  Eating 100% of meals  Goal of Therapy: INR 2-3  Plan:  Warfarin 5 mg PO tonight at 18:00  Daily INR  CBC at least q72 hr while on warfarin  Monitor for signs of bleeding or thrombosis   Bernadene Person, PharmD, BCPS Pager:  [phone] 12/24/2014, 10:11 AM

## 2014-12-24 NOTE — Evaluation (Signed)
Physical Therapy Evaluation Patient Details Name: [name] MRN: [id number] DOB: Oct 13, 1931 Today's Date: 12/24/2014   History of Present Illness  81 yo male admitted with pyelonephritis. hx of dementia, DVT, A fib, PE, spinal stenosis, COPD, falls.   Clinical Impression  On eval, pt required Mod assist +2 for mobility. Stood at [employer] with RW for ~10 seconds each attempt. Lateral scoot, bed to recliner. Pt fatigues easily and appears weaker than baseline. At baseline pt was able to transfer from bed to [facility] with mod Ind. At this time, recommend ST rehab at SNF if pt/family will agree. Wife states that she doesn't want to put him somewhere if he doesn't want to go. Expressed concern to pt and wife about pt's ability to safely mobilize at home. Wife states that she is only able to assist minimally. She also states that daughter helps in decision-making. If pt/family refuse SNF, then HHPT will be needed.     Follow Up Recommendations SNF;Supervision/Assistance - 24 hour (if pt/family will agree)    Equipment Recommendations  None recommended by PT    Recommendations for Other Services       Precautions / Restrictions Precautions Precautions: Fall Restrictions Weight Bearing Restrictions: No      Mobility  Bed Mobility Overal bed mobility: Needs Assistance Bed Mobility: Supine to Sit     Supine to sit: Mod assist;+2 for physical assistance;+2 for safety/equipment;HOB elevated     General bed mobility comments: Assist for trunk and bil LEs. Utilized bedpad for scooting, positioning. Increased time.   Transfers Overall transfer level: Needs assistance Equipment used: Rolling walker (2 wheeled) Transfers: Sit to/from Stand;Lateral/Scoot Transfers Sit to Stand: Mod assist;+2 physical assistance;+2 safety/equipment;From elevated surface        Lateral/Scoot Transfers: Mod assist;+2 physical assistance;+2 safety/equipment General transfer comment: Highly elevated bed surface for  sit to stand-performed x2-pt able to stand partially upright for ~10 sec each attempt. Lateral scoot, bed to recliner-used pad to aid with scooting.  Ambulation/Gait             General Gait Details: NT-pt non ambulatory  Stairs            Wheelchair Mobility    Modified Rankin (Stroke Patients Only)       Balance Overall balance assessment: Needs assistance;History of Falls         Standing balance support: Bilateral upper extremity supported;During functional activity Standing balance-Leahy Scale: Poor                               Pertinent Vitals/Pain Pain Assessment: No/denies pain    Home Living Family/patient expects to be discharged to:: Private residence Living Arrangements: Spouse/significant other Available Help at Discharge: Family;Available 24 hours/day;Personal care attendant (aide 4-5 hours M-F) Type of Home: House Home Access: Ramped entrance     Home Layout: One level Home Equipment: Wheelchair - [employer] - 2 wheels;Shower seat;Hospital bed      Prior Function Level of Independence: Needs assistance   Gait / Transfers Assistance Needed: transfers only-pt was able to perfrom transfers with Mod Ind up until a few days prior to admission           Hand Dominance        Extremity/Trunk Assessment   Upper Extremity Assessment: Generalized weakness           Lower Extremity Assessment: Generalized weakness      Cervical / Trunk Assessment:  Kyphotic  Communication   Communication: No difficulties  Cognition Arousal/Alertness: Awake/alert Behavior During Therapy: WFL for tasks assessed/performed Overall Cognitive Status: History of cognitive impairments - at baseline                      General Comments      Exercises        Assessment/Plan    PT Assessment Patient needs continued PT services  PT Diagnosis Generalized weakness   PT Problem List Decreased strength;Decreased activity  tolerance;Decreased balance;Decreased mobility;Decreased knowledge of use of DME;Decreased cognition;Decreased safety awareness;Decreased skin integrity  PT Treatment Interventions DME instruction;Functional mobility training;Therapeutic activities;Therapeutic exercise;Patient/family education;Balance training   PT Goals (Current goals can be found in the Care Plan section) Acute Rehab PT Goals Patient Stated Goal: home PT Goal Formulation: With patient/family Time For Goal Achievement: 01/07/15 Potential to Achieve Goals: Fair    Frequency Min 3X/week   Barriers to discharge        Co-evaluation               End of Session   Activity Tolerance: Patient tolerated treatment well Patient left: in chair;with call bell/phone within reach;with family/visitor present           Time: 1610-9604 PT Time Calculation (min) (ACUTE ONLY): 26 min   Charges:   PT Evaluation $Initial PT Evaluation Tier I: 1 Procedure PT Treatments $Therapeutic Activity: 8-22 mins   PT G Codes:        Rebeca Alert, MPT Pager: [phone]

## 2014-12-24 NOTE — Care Management Important Message (Signed)
Important Message  Patient Details  Name: JOBIN MONTELONGO MRN: [id number] Date of Birth: [birth year]   Medicare Important Message Given:  Yes-second notification given    Haskell Flirt 12/24/2014, 12:14 PMImportant Message  Patient Details  Name: MARLON SULEIMAN MRN: [id number] Date of Birth: 09-11-1931   Medicare Important Message Given:  Yes-second notification given    Haskell Flirt 12/24/2014, 12:14 PM

## 2014-12-24 NOTE — Care Management Note (Signed)
Case Management Note  Patient Details  Name: [name] MRN: [id number] Date of Birth: Aug 25, 1931  Subjective/Objective:                 sepsis   Action/Plan: home   Expected Discharge Date:  12/28/14               Expected Discharge Plan:  Home/Self Care  In-House Referral:     Discharge planning Services  CM Consult  Post Acute Care Choice:  NA Choice offered to:  NA  DME Arranged:  N/A DME Agency:  NA  HH Arranged:  NA HH Agency:  NA  Status of Service:  In process, will continue to follow  Medicare Important Message Given:    Date Medicare IM Given:    Medicare IM give by:    Date Additional Medicare IM Given:    Additional Medicare Important Message give by:     If discussed at Long Length of Stay Meetings, dates discussed:    Additional Comments:  Golda Acre, RN 12/24/2014, 9:52 AM

## 2014-12-24 NOTE — Progress Notes (Signed)
[name]:[id number] DOB: May 13, 1932 DOA: 12/22/2014 PCP: Frederica Kuster, MD  Brief narrative: 84 ? Cognitive impairment +/- Normal press HC, dementia, left leg DVT 05/2013 on anticoagulatio-He has had a PE provoked by Femoral # reportedly in 1986, SSS with RVR on chronic Coumadin, Prior history Cardiomyopathy with recent EF 60 percent grade 1 diastolic dysfunction 10/15/14 AAA infrarenal~ 3.8 cm in 2009 followed by Vasc surgery, venous insufficiency with lower extremity swelling and wearing Unna boots, , prior severe spinal stenosis s/p Decompressive laminectomy l4-l5 COPD, falls; who presented with fever and generalized weakness He has made good improvmeent and was tranaferred out if SDU on 7/25  Past medical history-As per Problem list Chart reviewed as below-   Consultants:  Dr. Annabell [facility]  Procedures:  Ct abd pelv No acute finding abdomen or pelvis. Small bilateral nonobstructing renal stones. Urinary bladder stone, unchanged compared the prior exam. Prostatomegaly. No change in a 4.4 cm infrarenal abdominal aortic aneurysm. Recommend followup by ultrasound in 1 year. This recommendation  Antibiotics:  Vancomycin 7/23-7/25  Zosyn 7/23   Subjective   Oriented and alert  tol diet No cp NO other c/o Nursing reports no issues with HR overnight   Objective    Interim History: CT abd pelv  Telemetry: Afib sus 140's   Objective: Filed Vitals:   12/24/14 0000 12/24/14 0005 12/24/14 0400 12/24/14 0440  BP: 120/42  131/46   Pulse: 66  65   Temp:  98.7 F (37.1 C)  98.7 F (37.1 C)  TempSrc:  Axillary  Axillary  Resp: 23  22   Height:      Weight:    147.2 kg (324 lb 8.3 oz)  SpO2: 92%  94%     Intake/Output Summary (Last 24 hours) at 12/24/14 8119 Last data filed at 12/24/14 0445  Gross per 24 hour  Intake   3690 ml  Output    970 ml  Net   2720 ml    Exam:  EOMI NCAT much moe oriented No JVD no bruit Thick neck Chest sounds  clear with mild crackles posteriorly Abdomen soft nontender nondistended no CVA tenderness no rebound nor guarding  lower extremities are swollen with redness however there is no pitting edema   Data Reviewed: Basic Metabolic Panel:  Recent Labs Lab 12/22/14 1045 12/22/14 1550 12/23/14 0415 12/24/14 0406  NA 140 142 140 141  K 3.9 4.6 4.0 4.0  CL 106 109 111 112*  CO2 GLUCOSE 127* 137* 92 103*  BUN CREATININE 1.26* 1.25* 1.14 1.20  CALCIUM 8.6* 8.4* 8.4* 8.3*   Liver Function Tests:  Recent Labs Lab 12/22/14 1045 12/22/14 1550 12/23/14 0415 12/24/14 0406  AST ALT 13* 14* 11* 13*  ALKPHOS 66 59 57 51  BILITOT 1.5* 1.3* 1.0 0.8  PROT 6.6 6.3* 5.9* 6.0*  ALBUMIN 3.4* 3.0* 2.8* 3.0*   No results for input(s): LIPASE, AMYLASE in the last 168 hours. No results for input(s): AMMONIA in the last 168 hours. CBC:  Recent Labs Lab 12/22/14 1045 12/22/14 1550 12/23/14 0415 12/24/14 0406  WBC 13.0* 18.8* 14.2* 8.5  NEUTROABS 11.4*  --   --   --   HGB 12.2* 11.7* 11.4* 10.4*  HCT 38.0* 36.5* 36.2* 32.3*  MCV 90.5 91.3 92.6 91.8  PLT 220 227 206 203   Cardiac Enzymes: No results for input(s): CKTOTAL, CKMB, CKMBINDEX, TROPONINI in the last 168  hours. BNP: Invalid input(s): POCBNP CBG: No results for input(s): GLUCAP in the last 168 hours.  Recent Results (from the past 240 hour(s))  MRSA PCR Screening     Status: None   Collection Time: 12/22/14  9:40 AM  Result Value Ref Range Status   MRSA by PCR NEGATIVE NEGATIVE Final    Comment:        The GeneXpert MRSA Assay (FDA approved for NASAL specimens only), is one component of a comprehensive MRSA colonization surveillance program. It is not intended to diagnose MRSA infection nor to guide or monitor treatment for MRSA infections.   Urine culture     Status: None (Preliminary result)   Collection Time: 12/22/14  9:54 AM  Result Value Ref Range Status   Specimen  Description URINE, RANDOM  Final   Special Requests NONE  Final   Culture   Final    CULTURE REINCUBATED FOR BETTER GROWTH Performed at [facility]    Report Status PENDING  Incomplete  Blood Culture (routine x 2)     Status: None (Preliminary result)   Collection Time: 12/22/14 11:07 AM  Result Value Ref Range Status   Specimen Description BLOOD LEFT ANTECUBITAL  Final   Special Requests   Final    BOTTLES DRAWN AEROBIC AND ANAEROBIC 10CC BOTH BOTTLES   Culture   Final    NO GROWTH < 24 HOURS Performed at [facility]    Report Status PENDING  Incomplete  Blood Culture (routine x 2)     Status: None (Preliminary result)   Collection Time: 12/22/14 11:16 AM  Result Value Ref Range Status   Specimen Description BLOOD BLOOD RIGHT HAND  Final   Special Requests   Final    BOTTLES DRAWN AEROBIC AND ANAEROBIC 10CC BOTH BOTTLES   Culture   Final    NO GROWTH < 24 HOURS Performed at [facility]    Report Status PENDING  Incomplete  Culture, Urine     Status: None (Preliminary result)   Collection Time: 12/22/14  2:00 PM  Result Value Ref Range Status   Specimen Description URINE, CATHETERIZED  Final   Special Requests   Final    Patient has been on recently Augmentin and as well as doxycycline and please get this specimen from the one that was done from the new Foley Immunocompromised   Culture   Final    CULTURE REINCUBATED FOR BETTER GROWTH Performed at [facility]    Report Status PENDING  Incomplete     Studies:              All Imaging reviewed and is as per above notation   Scheduled Meds: . diltiazem  30 mg Oral 4 times per day  . doxazosin  8 mg Oral Daily  . finasteride  5 mg Oral QHS  . latanoprost  1 drop Both Eyes QHS  . metoprolol tartrate  12.5 mg Oral BID  . piperacillin-tazobactam (ZOSYN)  IV  3.375 g Intravenous Q8H  . sodium chloride  3 mL Intravenous Q12H  . vancomycin  1,750 mg Intravenous Q24H  . Warfarin - Pharmacist  Dosing Inpatient   Does not apply q1800   Continuous Infusions: . sodium chloride 100 mL/hr at 12/24/14 0400     Assessment/Plan:  Pyelonephritis, acute likely secondary to BPH and noncompliance on medications -Complicated in setting of recent use of multiple antibiotics -New Foley catheter has been placed in the emergency room so we'll follow  cultures from that specimen only -urologist input appreciated. -options regarding prostatectomy pending -At baseline the patient has declined and has multifactorial failure to thrive and is poorly mobile and dependent on most ADLs so I'm not sure if doing an operation on him would overall be beneficial in the long run -Continue Cardura 8 mg daily, Proscar 5 mg every afternoon  Sepsis -Meets sepsis criterion however would not aggressively bolus him given his history of diastolic failure -was on SDU-can probably tx to telemetry today -narrow from Vanc/Zosyn to Zosyn monotherapy 7.25  acute kidney injury -baseline BUN/creatinine above 88 GFR -On admission GFR was 60 -NSL given history of grade 1 diastolic dysfunction on echocardiogram 10/15/14 -force oral fluids  euvolemic diastolic heart failure -As above -Not on any ACE inhibitor nor beta blocker -Readdress as an outpatient  AAA (abdominal aortic aneurysm) without rupture -Should follow up with Dr. early of vascular surgery as outpatient -Not sure if he needs anything other than active surveillance-   Multifactorial dementia -I do not see that the patient is on any type of dementia medication although he has a listed problem of this -Anticholinergic scan cause more complications so I would recommend as outpatient and this is reassessed with an MMSE and maybe low-dose  Namenda used as per PCP  Leg DVT (deep venous thromboembolism) -Patient apparently had "Coumadin holiday" -He should continue on Coumadin long-term as he had a provoked DVT in 1986 and has had subsequent DVTs -This of  course need to be balanced with his fall risk however he is non-mobile for the most part wheelchair bound  Persistent atrial fibrillation -Flipped into RVR a.m. 7/24 -giving Cardizem 30 q 6 -has had on-off RVR -added metoprolol 12.5 bid 7.24 -Might need up titration in dose -Monitor on SDU for now    Peripheral vascular disease  COPD (chronic obstructive pulmonary disease)  Code Status: DNR Family Communication: none at bedside Disposition Plan:  SDU   Pleas Koch, MD  [facility] Pager [phone] 12/24/2014, 8:21 AM    LOS: 2 days

## 2014-12-24 NOTE — Progress Notes (Signed)
Date:  December 24, 2014 U.R. performed for needs and level of care. Will continue to follow for Case Management needs.  Rhonda Davis, RN, BSN, CCM   [phone] 

## 2014-12-25 LAB — URINE CULTURE: Culture: >=100000

## 2014-12-25 LAB — PROTIME-INR
INR: 2.16 — ABNORMAL HIGH (ref 0.00–1.49)
PROTHROMBIN TIME: 23.9 s — AB (ref 11.6–15.2)

## 2014-12-25 MED ORDER — NITROFURANTOIN MACROCRYSTAL 100 MG PO CAPS: 100.0000 mg | ORAL_CAPSULE | Freq: Two times a day (BID) | ORAL | Status: DC

## 2014-12-25 MED ORDER — WARFARIN SODIUM 6 MG PO TABS
6.0000 mg | ORAL_TABLET | Freq: Once | ORAL | Status: AC
  Administered 2014-12-25: 6 mg via ORAL
  Filled 2014-12-25: qty 1

## 2014-12-25 MED ORDER — NITROFURANTOIN MONOHYD MACRO 100 MG PO CAPS
100.0000 mg | ORAL_CAPSULE | Freq: Two times a day (BID) | ORAL | Status: DC
  Administered 2014-12-25 – 2014-12-26 (×3): 100 mg via ORAL
  Filled 2014-12-25 (×4): qty 1

## 2014-12-25 NOTE — Clinical Social Work Note (Signed)
Clinical Social Work Assessment  Patient Details  Name: [name] MRN: [id number] Date of Birth: Jan 16, 1932  Date of referral:  12/24/14               Reason for consult:  Discharge Planning                Permission sought to share information with:  Family Supports Permission granted to share information::  Yes, Verbal Permission Granted  Name::     Randel Books  Agency::     Relationship::  daughter  Contact Information:  [phone]  Housing/Transportation Living arrangements for the past 2 months:  [place] of Information:  Spouse, Adult Children Patient Interpreter Needed:  None Criminal Activity/Legal Involvement Pertinent to Current Situation/Hospitalization:  No - Comment as needed Significant Relationships:  Adult Children, Spouse Lives with:  Spouse Do you feel safe going back to the place where you live?  No Need for family participation in patient care:  Yes (Comment)  Care giving concerns:  Pt admitted from home with pt wife. Pt weak and pt wife unable to continue to care for pt at home. Pt recommending SNF.   Social Worker assessment / plan:  CSW received referral for New SNF.  CSW met with pt, pt wife, and pt daughter at bedside. CSW introduced self and explained role. Pt confirmed that he lived at home with pt wife prior to admission. CSW discussed recommendation for rehab at SNF and pt and pt family agreeable that rehab at SNF would be appropriate at this time. Pt daughter discussed that pt has been to rehab in the past. Pt daughter shared that she wants to ensure that the rehab facility is in an area where pt wife can easily drive to the facility. Pt daughter request search to [facility], [place], and all of [place].   CSW completed FL2 and initiated SNF search to  [facility], [place], and all of [place].   CSW to follow up with pt and pt family regarding SNF bed offers.   CSW to continue to  follow to provide support and assist with pt disposition needs.   Employment status:  Retired Nurse, adult PT Recommendations:  [place] / Referral to community resources:  [place]  Patient/Family's Response to care:  Pt alert and oriented x 2. Pt has mild dementia, but answered direct questions appropriately. Pt wife and pt daughter supportive and actively involved in pt care. Pt daughter stated that a barrier to SNF facilities last time was pt height and weight as pt naturally is a large man. Pt daughter main concern is ensuring that pt wife will safely be able to get to the facility.  Patient/Family's Understanding of and Emotional Response to Diagnosis, Current Treatment, and Prognosis:  Pt daughter displayed strong understanding about pt diagnosis and treatment plan. Pt and pt family open to SNF following hospitalization.  Emotional Assessment Appearance:  Appears stated age Attitude/Demeanor/Rapport:  Other (pt appropriate) Affect (typically observed):  Quiet, Other (per chart, pt has mild dementia) Orientation:  Oriented to Self, Oriented to Situation Alcohol / Substance use:  Not Applicable Psych involvement (Current and /or in the community):  No (Comment)  Discharge Needs  Concerns to be addressed:  Discharge Planning Concerns Readmission within the last 30 days:  No Current discharge risk:  Physical Impairment Barriers to Discharge:  Continued Medical Work up   Alison Murray A, LCSW 12/25/2014, 10:45 AM  [phone]

## 2014-12-25 NOTE — Progress Notes (Signed)
ANTIBIOTIC CONSULT NOTE   Pharmacy Consult for Zosyn Indication: rule out sepsis  Allergies  Allergen Reactions  . Oxycodone Hcl Other (See Comments)     makes pt "wild"  . Propoxyphene N-Acetaminophen Other (See Comments)     Makes pt. "wild"    Patient Measurements: Height: 6\' 6"  (198.1 cm) Weight: (!) 329 lb 12.9 oz (149.6 kg) IBW/kg (Calculated) : 91.4  Vital Signs: Temp: 98.7 F (37.1 C) (07/26 0526) Temp Source: Oral (07/26 0526) BP: 152/63 mmHg (07/26 0526) Pulse Rate: 58 (07/26 0526) Intake/Output from previous day: 07/25 0701 - 07/26 0700 In: 1030 [P.O.:480; I.V.:400; IV Piggyback:150] Out: 1375 [Urine:1375] Intake/Output from this shift:    Labs:  Recent Labs  12/22/14 1550 12/23/14 0415 12/24/14 0406  WBC 18.8* 14.2* 8.5  HGB 11.7* 11.4* 10.4*  PLT 227 206 203  CREATININE 1.25* 1.14 1.20   Estimated Creatinine Clearance: 78.3 mL/min (by C-G formula based on Cr of 1.2). No results for input(s): VANCOTROUGH, VANCOPEAK, VANCORANDOM, GENTTROUGH, GENTPEAK, GENTRANDOM, TOBRATROUGH, TOBRAPEAK, TOBRARND, AMIKACINPEAK, AMIKACINTROU, AMIKACIN in the last 72 hours.   Microbiology: Recent Results (from the past 720 hour(s))  Urine culture     Status: Abnormal   Collection Time: 11/27/14 10:57 AM  Result Value Ref Range Status   Urine Culture, Routine Final report (A)  Final   Result 1 Staphylococcus aureus (A)  Final    Comment: Greater than 100,000 colony forming units per mL Methicillin resistant (MRSA) Based on resistance to oxacillin this isolate would be resistant to all currently available beta-lactam antimicrobial agents, with the exception of the newer cephalosporins with anti-MRSA activity, such as Ceftaroline    ANTIMICROBIAL SUSCEPTIBILITY Comment  Final    Comment:       ** S = Susceptible; I = Intermediate; R = Resistant **                    P = Positive; N = Negative             MICS are expressed in micrograms per mL    Antibiotic                  RSLT#1    RSLT#2    RSLT#3    RSLT#4 Ciprofloxacin                  R Gentamicin                     S Levofloxacin                   R Linezolid                      S Nitrofurantoin                 S Oxacillin                      R Penicillin                     R Rifampin                       S Tetracycline                   S Trimethoprim/Sulfa             R Vancomycin  S   MRSA PCR Screening     Status: None   Collection Time: 12/22/14  9:40 AM  Result Value Ref Range Status   MRSA by PCR NEGATIVE NEGATIVE Final    Comment:        The GeneXpert MRSA Assay (FDA approved for NASAL specimens only), is one component of a comprehensive MRSA colonization surveillance program. It is not intended to diagnose MRSA infection nor to guide or monitor treatment for MRSA infections.   Urine culture     Status: None   Collection Time: 12/22/14  9:54 AM  Result Value Ref Range Status   Specimen Description URINE, RANDOM  Final   Special Requests NONE  Final   Culture   Final    80,000 COLONIES/ml YEAST Performed at [facility]    Report Status 12/24/2014 FINAL  Final  Blood Culture (routine x 2)     Status: None (Preliminary result)   Collection Time: 12/22/14 11:07 AM  Result Value Ref Range Status   Specimen Description BLOOD LEFT ANTECUBITAL  Final   Special Requests   Final    BOTTLES DRAWN AEROBIC AND ANAEROBIC 10CC BOTH BOTTLES   Culture   Final    NO GROWTH 2 DAYS Performed at [facility]    Report Status PENDING  Incomplete  Blood Culture (routine x 2)     Status: None (Preliminary result)   Collection Time: 12/22/14 11:16 AM  Result Value Ref Range Status   Specimen Description BLOOD BLOOD RIGHT HAND  Final   Special Requests   Final    BOTTLES DRAWN AEROBIC AND ANAEROBIC 10CC BOTH BOTTLES   Culture   Final    NO GROWTH 2 DAYS Performed at [facility]    Report Status PENDING  Incomplete  Culture, Urine      Status: None (Preliminary result)   Collection Time: 12/22/14  2:00 PM  Result Value Ref Range Status   Specimen Description URINE, CATHETERIZED  Final   Special Requests   Final    Patient has been on recently Augmentin and as well as doxycycline and please get this specimen from the one that was done from the new Foley Immunocompromised   Culture   Final    CULTURE REINCUBATED FOR BETTER GROWTH Performed at [facility]    Report Status PENDING  Incomplete    Medical History: Past Medical History  Diagnosis Date  . Venous insufficiency   . AAA (abdominal aortic aneurysm)   . Ulcer   . DVT (deep venous thrombosis) 05/04/2013    "LLE"  . Cellulitis   . Nicotine dependence   . Bronchospasm   . Elevated PSA   . BPH (benign prostatic hyperplasia)   . COPD (chronic obstructive pulmonary disease)   . Cognitive impairment   . Daily headache   . Arthritis     "probably on the right leg/hip" (09/20/2013)  . Dementia     "don't know exactly what kind" (09/20/2013)  . Kidney stones   . Glaucoma, both eyes   . Atrial fibrillation   . Fall at home September 18, 2013  . Benign localized hyperplasia of prostate with urinary retention 12/23/2014    Medications:  Scheduled:  . diltiazem  30 mg Oral 4 times per day  . doxazosin  8 mg Oral Daily  . finasteride  5 mg Oral QHS  . latanoprost  1 drop Both Eyes QHS  . metoprolol tartrate  12.5 mg Oral BID  .  piperacillin-tazobactam (ZOSYN)  IV  3.375 g Intravenous Q8H  . sodium chloride  3 mL Intravenous Q12H  . Warfarin - Pharmacist Dosing Inpatient   Does not apply q1800   Infusions:    Assessment: 81 yo presents with hip pain after fall and fever. Per RN notes, patient also with bedsore and had foley catheter placed 7/21 for BPH at urology office. To start vancomycin and Zosyn per pharmacy for possible sepsis  7/23 >> Vanc >> 7/25 7/23 >> Zosyn >>    7/23 blood x2: ngtd 7/23 urine (cath): re-incubated 7/23 urine (rand): 80K  yeast - likely represents colonization 7/25 urine (cath): IP  WBC improving, CrCL >51ml/min, afebrile  Goal of Therapy:  Dose for indication and for patient-specific parameters  Plan:  Day #4 zosyn  Continue Zosyn 3.375g IV q8h over 4 infusion   Await final culture results  Do no anticipate any dose requirements, follow at distance.   Juliette Alcide, PharmD, BCPS.   Pager: [phone] 12/25/2014 8:32 AM

## 2014-12-25 NOTE — Progress Notes (Signed)
CSW continuing to follow for pt discharge planning needs.   CSW followed up with pt and pt wife at bedside. CSW provided SNF bed offers. Pt wife requested CSW to contact pt daughter via telephone as pt daughter will make decision about SNF.   CSW contacted pt daughter, [name] via telephone. CSW discussed bed offers with pt daughter. Pt daughter chooses bed at [facility].   CSW contacted [facility] and confirmed that facility can accept pt when medically ready for discharge. Per MD, anticipate discharge tomorrow.  CSW to continue to follow to provide support and assist with pt discharge planning needs.   Loletta Specter, MSW, LCSW Clinical Social Work [phone]

## 2014-12-25 NOTE — Clinical Social Work Placement (Signed)
   CLINICAL SOCIAL WORK PLACEMENT  NOTE  Date:  12/25/2014  Patient Details  Name: [name] MRN: [id number] Date of Birth: 1931-08-02  Clinical Social Work is seeking post-discharge placement for this patient at the Skilled  Nursing Facility level of care (*CSW will initial, date and re-position this form in  chart as items are completed):  Yes   Patient/family provided with [place] Clinical Social Work Department's list of facilities offering this level of care within the geographic area requested by the patient (or if unable, by the patient's family).  Yes   Patient/family informed of their freedom to choose among providers that offer the needed level of care, that participate in Medicare, Medicaid or managed care program needed by the patient, have an available bed and are willing to accept the patient.  Yes   Patient/family informed of [place]'s ownership interest in [facility] and [facility], as well as of the fact that they are under no obligation to receive care at these facilities.  PASRR submitted to EDS on       PASRR number received on       Existing PASRR number confirmed on 12/25/14     FL2 transmitted to all facilities in geographic area requested by pt/family on 12/25/14     FL2 transmitted to all facilities within larger geographic area on       Patient informed that his/her managed care company has contracts with or will negotiate with certain facilities, including the following:        Yes   Patient/family informed of bed offers received.  Patient chooses bed at [facility]     Physician recommends and patient chooses bed at      Patient to be transferred to   on  .  Patient to be transferred to facility by       Patient family notified on   of transfer.  Name of family member notified:        PHYSICIAN Please sign FL2     Additional Comment:    _______________________________________________ Orson Eva,  LCSW 12/25/2014, 3:37 PM

## 2014-12-25 NOTE — Progress Notes (Signed)
[name]:[id number] DOB: 1931-12-02 DOA: 12/22/2014 PCP: Frederica Kuster, MD  Brief narrative: 61 ? Cognitive impairment +/- Normal press HC, dementia, left leg DVT 05/2013 on anticoagulatio-He has had a PE provoked by Femoral # reportedly in 1986, SSS with RVR on chronic Coumadin, Prior history Cardiomyopathy with recent EF 60 percent grade 1 diastolic dysfunction 10/15/14 AAA infrarenal~ 3.8 cm in 2009 followed by Vasc surgery, venous insufficiency with lower extremity swelling and wearing Unna boots, , prior severe spinal stenosis s/p Decompressive laminectomy l4-l5 COPD, falls; who presented with fever and generalized weakness He has made good improvmeent and was tranaferred out if SDU on 7/25 abx were narrowed to macrobid 7/26  Past medical history-As per Problem list Chart reviewed as below-   Consultants:  Dr. [facility]  Procedures:  Ct abd pelv No acute finding abdomen or pelvis. Small bilateral nonobstructing renal stones. Urinary bladder stone, unchanged compared the prior exam. Prostatomegaly. No change in a 4.4 cm infrarenal abdominal aortic aneurysm. Recommend followup by ultrasound in 1 year. This recommendation  Antibiotics:  Vancomycin 7/23-7/25  Zosyn 7/23-->7/26  Macrodantin 7/26   Subjective   Well wishes to leave trasnferred with "lift" yesterday no spontaneous c/o  no cp nor diarrhea    Objective    Interim History: CT abd pelv  Telemetry: Afib sus 140's   Objective: Filed Vitals:   12/24/14 1100 12/24/14 1330 12/24/14 2131 12/25/14 0526  BP: 130/46 126/51 134/48 152/63  Pulse: 65 61 70 58  Temp: 99 F (37.2 C) 98.8 F (37.1 C) 98.2 F (36.8 C) 98.7 F (37.1 C)  TempSrc: Oral Oral Oral Oral  Resp: 21 20 22 20   Height: 6\' 6"  (1.981 m)     Weight: 149.6 kg (329 lb 12.9 oz)     SpO2: 98% 97% 94% 98%    Intake/Output Summary (Last 24 hours) at 12/25/14 1011 Last data filed at 12/25/14 0527  Gross per 24 hour    Intake    340 ml  Output   1375 ml  Net  -1035 ml    Exam:  EOMI NCAT much moe oriented No JVD no bruit Thick neck Chest sounds clear with mild crackles posteriorly Abdomen soft nontender nondistended no CVA tenderness no rebound nor guarding  lower extremities in Unna boots and not examined   Data Reviewed: Basic Metabolic Panel:  Recent Labs Lab 12/22/14 1045 12/22/14 1550 12/23/14 0415 12/24/14 0406  NA 140 142 140 141  K 3.9 4.6 4.0 4.0  CL 106 109 111 112*  CO2 27 27 24 24   GLUCOSE 127* 137* 92 103*  BUN 14 14 16 20   CREATININE 1.26* 1.25* 1.14 1.20  CALCIUM 8.6* 8.4* 8.4* 8.3*   Liver Function Tests:  Recent Labs Lab 12/22/14 1045 12/22/14 1550 12/23/14 0415 12/24/14 0406  AST 19 23 15 16   ALT 13* 14* 11* 13*  ALKPHOS 66 59 57 51  BILITOT 1.5* 1.3* 1.0 0.8  PROT 6.6 6.3* 5.9* 6.0*  ALBUMIN 3.4* 3.0* 2.8* 3.0*   No results for input(s): LIPASE, AMYLASE in the last 168 hours. No results for input(s): AMMONIA in the last 168 hours. CBC:  Recent Labs Lab 12/22/14 1045 12/22/14 1550 12/23/14 0415 12/24/14 0406  WBC 13.0* 18.8* 14.2* 8.5  NEUTROABS 11.4*  --   --   --   HGB 12.2* 11.7* 11.4* 10.4*  HCT 38.0* 36.5* 36.2* 32.3*  MCV 90.5 91.3 92.6 91.8  PLT 220 227 206 203   Cardiac  Enzymes: No results for input(s): CKTOTAL, CKMB, CKMBINDEX, TROPONINI in the last 168 hours. BNP: Invalid input(s): POCBNP CBG: No results for input(s): GLUCAP in the last 168 hours.  Recent Results (from the past 240 hour(s))  MRSA PCR Screening     Status: None   Collection Time: 12/22/14  9:40 AM  Result Value Ref Range Status   MRSA by PCR NEGATIVE NEGATIVE Final    Comment:        The GeneXpert MRSA Assay (FDA approved for NASAL specimens only), is one component of a comprehensive MRSA colonization surveillance program. It is not intended to diagnose MRSA infection nor to guide or monitor treatment for MRSA infections.   Urine culture      Status: None   Collection Time: 12/22/14  9:54 AM  Result Value Ref Range Status   Specimen Description URINE, RANDOM  Final   Special Requests NONE  Final   Culture   Final    80,000 COLONIES/ml YEAST Performed at [facility]    Report Status 12/24/2014 FINAL  Final  Blood Culture (routine x 2)     Status: None (Preliminary result)   Collection Time: 12/22/14 11:07 AM  Result Value Ref Range Status   Specimen Description BLOOD LEFT ANTECUBITAL  Final   Special Requests   Final    BOTTLES DRAWN AEROBIC AND ANAEROBIC 10CC BOTH BOTTLES   Culture   Final    NO GROWTH 2 DAYS Performed at [facility]    Report Status PENDING  Incomplete  Blood Culture (routine x 2)     Status: None (Preliminary result)   Collection Time: 12/22/14 11:16 AM  Result Value Ref Range Status   Specimen Description BLOOD BLOOD RIGHT HAND  Final   Special Requests   Final    BOTTLES DRAWN AEROBIC AND ANAEROBIC 10CC BOTH BOTTLES   Culture   Final    NO GROWTH 2 DAYS Performed at [facility]    Report Status PENDING  Incomplete  Culture, Urine     Status: None (Preliminary result)   Collection Time: 12/22/14  2:00 PM  Result Value Ref Range Status   Specimen Description URINE, CATHETERIZED  Final   Special Requests   Final    Patient has been on recently Augmentin and as well as doxycycline and please get this specimen from the one that was done from the new Foley Immunocompromised   Culture   Final    CULTURE REINCUBATED FOR BETTER GROWTH Performed at [facility]    Report Status PENDING  Incomplete     Studies:              All Imaging reviewed and is as per above notation   Scheduled Meds: . diltiazem  30 mg Oral 4 times per day  . doxazosin  8 mg Oral Daily  . finasteride  5 mg Oral QHS  . latanoprost  1 drop Both Eyes QHS  . metoprolol tartrate  12.5 mg Oral BID  . nitrofurantoin  100 mg Oral Q12H  . sodium chloride  3 mL Intravenous Q12H  . warfarin  6 mg  Oral ONCE-1800  . Warfarin - Pharmacist Dosing Inpatient   Does not apply q1800   Continuous Infusions:     Assessment/Plan:  Pyelonephritis, acute likely secondary to BPH and noncompliance on medications -Complicated in setting of recent use of multiple antibiotics -New Foley catheter has been placed in the emergency room so we'll follow cultures from that  specimen only -urologist input appreciated. -options regarding prostatectomy pending -At baseline the patient has declined and has multifactorial failure to thrive and is poorly mobile and dependent on most ADLs so I'm not sure if doing an operation on him would overall be beneficial in the long run -Continue Cardura 8 mg daily, Proscar 5 mg every afternoon  Sepsis -Meets sepsis criterion however would not aggressively bolus him given his history of diastolic failure -was on SDU-can probably tx to telemetry today -narrow from Vanc/Zosyn to Zosyn monotherapy 7.25 -narrowed further to Macrodantin 100 bid 7/26 -Yeast 80K probably a colonization as CT abd didn't;t reveal any anomaly on admit  acute kidney injury -baseline BUN/creatinine above 88 GFR -On admission GFR was 60 -NSL given history of grade 1 diastolic dysfunction on echocardiogram 10/15/14 -stable today  euvolemic diastolic heart failure -As above -Not on any ACE inhibitor nor beta blocker -Readdress as an outpatient  AAA (abdominal aortic aneurysm) without rupture -Should follow up with Dr. early of vascular surgery as outpatient -Not sure if he needs anything other than active surveillance   Multifactorial dementia-very mild -I do not see that the patient is on any type of dementia medication although he has a listed problem of this -Anticholinergic scan cause more complications  Leg DVT (deep venous thromboembolism) -Patient apparently had "Coumadin holiday" -He should continue on Coumadin long-term as he had a provoked DVT in 1986 and has had subsequent  DVTs -INr therapeutic--watch trends as now on PO macrobid  Persistent atrial fibrillation -Flipped into RVR a.m. 7/24 -giving Cardizem 30 q 6 -has had on-off RVR -added metoprolol 12.5 bid 7.24 -Might need up titration in dose -Monitor on SDU for now   Peripheral vascular disease  COPD (chronic obstructive pulmonary disease)  Code Status: DNR Family Communication: called cell phone to d/w [name], patient Daughter 7.26-left VM Disposition Plan:  Needs SNF in 24-48 hrs   Pleas Koch, MD  [facility] Pager [phone] 12/25/2014, 10:11 AM    LOS: 3 days

## 2014-12-25 NOTE — Progress Notes (Signed)
ANTICOAGULATION CONSULT NOTE - Follow Up Consult  Pharmacy Consult for warfarin Indication: Hx DVT, Afib  Allergies  Allergen Reactions  . Oxycodone Hcl Other (See Comments)     makes pt "wild"  . Propoxyphene N-Acetaminophen Other (See Comments)     Makes pt. "wild"    Patient Measurements: Height: 6\' 6"  (198.1 cm) Weight: (!) 329 lb 12.9 oz (149.6 kg) IBW/kg (Calculated) : 91.4  Vital Signs: Temp: 98.7 F (37.1 C) (07/26 0526) Temp Source: Oral (07/26 0526) BP: 152/63 mmHg (07/26 0526) Pulse Rate: 58 (07/26 0526)  Labs:  Recent Labs  12/22/14 1550 12/23/14 0415 12/24/14 0406 12/25/14 0412  HGB 11.7* 11.4* 10.4*  --   HCT 36.5* 36.2* 32.3*  --   PLT 227 206 203  --   LABPROT  --  25.4* 24.6* 23.9*  INR  --  2.34* 2.25* 2.16*  CREATININE 1.25* 1.14 1.20  --     Estimated Creatinine Clearance: 78.3 mL/min (by C-G formula based on Cr of 1.2).   Medications:  Prescriptions prior to admission  Medication Sig Dispense Refill Last Dose  . acetaminophen (TYLENOL) 500 MG tablet Take 1,000 mg by mouth every 6 (six) hours as needed (pain).   12/21/2014 at Unknown time  . diltiazem (CARDIZEM CD) 120 MG 24 hr capsule Take 120 mg by mouth daily.    12/21/2014 at Unknown time  . doxazosin (CARDURA) 8 MG tablet Take 8 mg by mouth daily.   12/21/2014 at Unknown time  . finasteride (PROSCAR) 5 MG tablet Take 5 mg by mouth at bedtime.    12/21/2014 at Unknown time  . furosemide (LASIX) 80 MG tablet Take 1 tablet by mouth daily.   12/21/2014 at Unknown time  . latanoprost (XALATAN) 0.005 % ophthalmic solution Place 1 drop into both eyes at bedtime.    12/21/2014 at Unknown time  . potassium chloride (K-DUR) 10 MEQ tablet Take 1 tablet (10 mEq total) by mouth daily. 30 tablet 5 12/21/2014 at Unknown time  . trimethoprim (TRIMPEX) 100 MG tablet Take 1 tablet (100 mg total) by mouth daily. 30 tablet 1 12/21/2014 at Unknown time  . warfarin (COUMADIN) 1 MG tablet Take 1 mg by mouth daily.  Take one tablet by mouth on Sunday, Tuesday, Thursday, and Saturday with 5mg  tablet that is taken daily   12/20/2014  . warfarin (COUMADIN) 5 MG tablet Take 1 tablet (5 mg total) by mouth daily. (Patient taking differently: Take 5 mg by mouth daily with supper. On Tues, Thurs, Sat and Sun pt takes additional 1 mg tablet) 30 tablet 2 12/21/2014 at 1800  . ZINC OXIDE EX Apply 1 application topically once a week. Infused wrap for legs   12/19/2014  . amoxicillin-clavulanate (AUGMENTIN) 875-125 MG per tablet Take 1 tablet by mouth 2 (two) times daily. (Patient not taking: Reported on 12/22/2014) 20 tablet 0   . doxycycline (VIBRA-TABS) 100 MG tablet Take 1 tablet (100 mg total) by mouth 2 (two) times daily. (Patient not taking: Reported on 12/22/2014) 20 tablet 0    Scheduled:  . diltiazem  30 mg Oral 4 times per day  . doxazosin  8 mg Oral Daily  . finasteride  5 mg Oral QHS  . latanoprost  1 drop Both Eyes QHS  . metoprolol tartrate  12.5 mg Oral BID  . piperacillin-tazobactam (ZOSYN)  IV  3.375 g Intravenous Q8H  . sodium chloride  3 mL Intravenous Q12H  . Warfarin - Pharmacist Dosing Inpatient   Does not  apply q1800    Assessment: 81yo obese M admitted with fever and hip pain after falling at home. Treating for possible sepsis. Xray was negative for fracture. CT of the head was negative for hemorrhage. Pharmacy is asked to dose warfarin while inpatient.   Baseline INR: therapeutic  Prior anticoagulation: warfarin 6 mg daily except 5 mg MWF, last dose 7/22  Significant events:  Today, 12/25/2014:  CBC:  (7/25) Hgb low but appears stable  INR  = 2.16 (therapeutic)  Major drug interactions: none  No bleeding issues per nursing  Eating 100% of meals  Goal of Therapy: INR 2-3  Plan:  Warfarin 6 mg PO tonight at 18:00  Daily INR  Monitor for signs of bleeding or thrombosis  Juliette Alcide, PharmD, BCPS.   Pager: [phone] 12/25/2014, 8:43 AM

## 2014-12-25 NOTE — Clinical Social Work Placement (Signed)
   CLINICAL SOCIAL WORK PLACEMENT  NOTE  Date:  12/25/2014  Patient Details  Name: [name] MRN: [id number] Date of Birth: July 12, 1931  Clinical Social Work is seeking post-discharge placement for this patient at the Skilled  Nursing Facility level of care (*CSW will initial, date and re-position this form in  chart as items are completed):  Yes   Patient/family provided with [place] Clinical Social Work Department's list of facilities offering this level of care within the geographic area requested by the patient (or if unable, by the patient's family).  Yes   Patient/family informed of their freedom to choose among providers that offer the needed level of care, that participate in Medicare, Medicaid or managed care program needed by the patient, have an available bed and are willing to accept the patient.  Yes   Patient/family informed of [place]'s ownership interest in [facility] and [facility], as well as of the fact that they are under no obligation to receive care at these facilities.  PASRR submitted to EDS on       PASRR number received on       Existing PASRR number confirmed on 12/25/14     FL2 transmitted to all facilities in geographic area requested by pt/family on 12/25/14     FL2 transmitted to all facilities within larger geographic area on       Patient informed that his/her managed care company has contracts with or will negotiate with certain facilities, including the following:            Patient/family informed of bed offers received.  Patient chooses bed at       Physician recommends and patient chooses bed at      Patient to be transferred to   on  .  Patient to be transferred to facility by       Patient family notified on   of transfer.  Name of family member notified:        PHYSICIAN Please sign FL2     Additional Comment:    _______________________________________________ Orson Eva, LCSW 12/25/2014, 10:50 AM

## 2014-12-26 ENCOUNTER — Inpatient Hospital Stay
Admission: RE | Admit: 2014-12-26 | Discharge: 2015-01-11 | Disposition: A | Discharge status: Still In Hospital | Payer: Medicare Other | Source: Ambulatory Visit | Attending: Internal Medicine | Admitting: Internal Medicine

## 2014-12-26 DIAGNOSIS — I739 Peripheral vascular disease, unspecified: Secondary | ICD-10-CM | POA: Diagnosis not present

## 2014-12-26 DIAGNOSIS — I872 Venous insufficiency (chronic) (peripheral): Secondary | ICD-10-CM | POA: Diagnosis not present

## 2014-12-26 DIAGNOSIS — N10 Acute tubulo-interstitial nephritis: Secondary | ICD-10-CM | POA: Diagnosis not present

## 2014-12-26 DIAGNOSIS — I5032 Chronic diastolic (congestive) heart failure: Secondary | ICD-10-CM | POA: Diagnosis not present

## 2014-12-26 DIAGNOSIS — H409 Unspecified glaucoma: Secondary | ICD-10-CM | POA: Diagnosis not present

## 2014-12-26 DIAGNOSIS — F0390 Unspecified dementia without behavioral disturbance: Secondary | ICD-10-CM | POA: Diagnosis not present

## 2014-12-26 DIAGNOSIS — Z9181 History of falling: Secondary | ICD-10-CM | POA: Diagnosis not present

## 2014-12-26 DIAGNOSIS — I714 Abdominal aortic aneurysm, without rupture: Secondary | ICD-10-CM | POA: Diagnosis not present

## 2014-12-26 DIAGNOSIS — M6281 Muscle weakness (generalized): Secondary | ICD-10-CM | POA: Diagnosis not present

## 2014-12-26 DIAGNOSIS — R339 Retention of urine, unspecified: Secondary | ICD-10-CM | POA: Diagnosis not present

## 2014-12-26 DIAGNOSIS — N210 Calculus in bladder: Secondary | ICD-10-CM

## 2014-12-26 DIAGNOSIS — I82402 Acute embolism and thrombosis of unspecified deep veins of left lower extremity: Secondary | ICD-10-CM | POA: Diagnosis not present

## 2014-12-26 DIAGNOSIS — R627 Adult failure to thrive: Secondary | ICD-10-CM | POA: Diagnosis not present

## 2014-12-26 DIAGNOSIS — J9811 Atelectasis: Secondary | ICD-10-CM | POA: Diagnosis not present

## 2014-12-26 DIAGNOSIS — I429 Cardiomyopathy, unspecified: Secondary | ICD-10-CM | POA: Diagnosis not present

## 2014-12-26 DIAGNOSIS — I4891 Unspecified atrial fibrillation: Secondary | ICD-10-CM | POA: Diagnosis not present

## 2014-12-26 DIAGNOSIS — R488 Other symbolic dysfunctions: Secondary | ICD-10-CM | POA: Diagnosis not present

## 2014-12-26 DIAGNOSIS — J449 Chronic obstructive pulmonary disease, unspecified: Secondary | ICD-10-CM | POA: Diagnosis not present

## 2014-12-26 DIAGNOSIS — N12 Tubulo-interstitial nephritis, not specified as acute or chronic: Secondary | ICD-10-CM | POA: Diagnosis not present

## 2014-12-26 DIAGNOSIS — H918X2 Other specified hearing loss, left ear: Secondary | ICD-10-CM | POA: Diagnosis not present

## 2014-12-26 DIAGNOSIS — R262 Difficulty in walking, not elsewhere classified: Secondary | ICD-10-CM | POA: Diagnosis not present

## 2014-12-26 DIAGNOSIS — R062 Wheezing: Secondary | ICD-10-CM | POA: Diagnosis present

## 2014-12-26 DIAGNOSIS — Z7901 Long term (current) use of anticoagulants: Secondary | ICD-10-CM | POA: Diagnosis not present

## 2014-12-26 DIAGNOSIS — R338 Other retention of urine: Secondary | ICD-10-CM | POA: Diagnosis not present

## 2014-12-26 DIAGNOSIS — M1990 Unspecified osteoarthritis, unspecified site: Secondary | ICD-10-CM | POA: Diagnosis not present

## 2014-12-26 DIAGNOSIS — I480 Paroxysmal atrial fibrillation: Secondary | ICD-10-CM | POA: Diagnosis not present

## 2014-12-26 DIAGNOSIS — I82409 Acute embolism and thrombosis of unspecified deep veins of unspecified lower extremity: Secondary | ICD-10-CM | POA: Diagnosis not present

## 2014-12-26 DIAGNOSIS — Z5181 Encounter for therapeutic drug level monitoring: Secondary | ICD-10-CM | POA: Diagnosis not present

## 2014-12-26 DIAGNOSIS — I482 Chronic atrial fibrillation: Secondary | ICD-10-CM | POA: Diagnosis not present

## 2014-12-26 DIAGNOSIS — I5030 Unspecified diastolic (congestive) heart failure: Secondary | ICD-10-CM | POA: Diagnosis not present

## 2014-12-26 DIAGNOSIS — I509 Heart failure, unspecified: Secondary | ICD-10-CM | POA: Diagnosis not present

## 2014-12-26 DIAGNOSIS — A419 Sepsis, unspecified organism: Secondary | ICD-10-CM | POA: Diagnosis not present

## 2014-12-26 DIAGNOSIS — I420 Dilated cardiomyopathy: Secondary | ICD-10-CM | POA: Diagnosis not present

## 2014-12-26 DIAGNOSIS — J42 Unspecified chronic bronchitis: Secondary | ICD-10-CM | POA: Diagnosis not present

## 2014-12-26 DIAGNOSIS — N401 Enlarged prostate with lower urinary tract symptoms: Secondary | ICD-10-CM | POA: Diagnosis not present

## 2014-12-26 DIAGNOSIS — R278 Other lack of coordination: Secondary | ICD-10-CM | POA: Diagnosis not present

## 2014-12-26 LAB — COMPREHENSIVE METABOLIC PANEL
ALBUMIN: 2.8 g/dL — AB (ref 3.5–5.0)
ALK PHOS: 55 U/L (ref 38–126)
ALT: 16 U/L — AB (ref 17–63)
ANION GAP: 4 — AB (ref 5–15)
AST: 19 U/L (ref 15–41)
BUN: 12 mg/dL (ref 6–20)
CO2: 26 mmol/L (ref 22–32)
Calcium: 8.6 mg/dL — ABNORMAL LOW (ref 8.9–10.3)
Chloride: 113 mmol/L — ABNORMAL HIGH (ref 101–111)
Creatinine, Ser: 0.88 mg/dL (ref 0.61–1.24)
GFR calc non Af Amer: >60 mL/min (ref >60)
Glucose, Bld: 108 mg/dL — ABNORMAL HIGH (ref 65–99)
Potassium: 4.1 mmol/L (ref 3.5–5.1)
Sodium: 143 mmol/L (ref 135–145)
Total Bilirubin: 0.5 mg/dL (ref 0.3–1.2)
Total Protein: 6.1 g/dL — ABNORMAL LOW (ref 6.5–8.1)

## 2014-12-26 LAB — URINE CULTURE

## 2014-12-26 LAB — CBC
HCT: 34.7 % — ABNORMAL LOW (ref 39.0–52.0)
HEMOGLOBIN: 11 g/dL — AB (ref 13.0–17.0)
MCH: 29.2 pg (ref 26.0–34.0)
MCHC: 31.7 g/dL (ref 30.0–36.0)
MCV: 92 fL (ref 78.0–100.0)
PLATELETS: 260 10*3/uL (ref 150–400)
RBC: 3.77 MIL/uL — ABNORMAL LOW (ref 4.22–5.81)
RDW: 15.6 % — ABNORMAL HIGH (ref 11.5–15.5)
WBC: 7.5 10*3/uL (ref 4.0–10.5)

## 2014-12-26 LAB — PROTIME-INR
INR: 2.23 — ABNORMAL HIGH (ref 0.00–1.49)
Prothrombin Time: 24.5 seconds — ABNORMAL HIGH (ref 11.6–15.2)

## 2014-12-26 MED ORDER — FLUCONAZOLE 100 MG PO TABS: 100.0000 mg | ORAL_TABLET | Freq: Every day | ORAL | Status: DC

## 2014-12-26 MED ORDER — ACETAMINOPHEN 325 MG PO TABS
650.0000 mg | ORAL_TABLET | Freq: Four times a day (QID) | ORAL | Status: DC | PRN
  Administered 2014-12-26: 650 mg via ORAL
  Filled 2014-12-26: qty 2

## 2014-12-26 MED ORDER — FLUCONAZOLE 100 MG PO TABS
200.0000 mg | ORAL_TABLET | Freq: Once | ORAL | Status: AC
  Administered 2014-12-26: 200 mg via ORAL
  Filled 2014-12-26: qty 2

## 2014-12-26 MED ORDER — WARFARIN SODIUM 5 MG PO TABS: 5.0000 mg | ORAL_TABLET | Freq: Once | ORAL | Status: DC

## 2014-12-26 MED ORDER — NITROFURANTOIN MONOHYD MACRO 100 MG PO CAPS: 100.0000 mg | ORAL_CAPSULE | Freq: Two times a day (BID) | ORAL | Status: DC

## 2014-12-26 MED ORDER — WARFARIN SODIUM 5 MG PO TABS: 5.0000 mg | ORAL_TABLET | Freq: Every day | ORAL | Status: DC

## 2014-12-26 MED ORDER — METOPROLOL TARTRATE 25 MG PO TABS: 12.5000 mg | ORAL_TABLET | Freq: Two times a day (BID) | ORAL | Status: DC

## 2014-12-26 NOTE — Clinical Social Work Placement (Signed)
Patient is set to discharge to [facility] SNF today. Patient & daughter, [name] aware. Discharge packet given to RN, Darl Pikes. [facility] called for transport.     Lincoln Maxin, LCSW [facility] Clinical Social Worker cell #: [phone]    CLINICAL SOCIAL WORK PLACEMENT  NOTE  Date:  12/26/2014  Patient Details  Name: [name] MRN: [id number] Date of Birth: May 04, 1932  Clinical Social Work is seeking post-discharge placement for this patient at the Skilled  Nursing Facility level of care (*CSW will initial, date and re-position this form in  chart as items are completed):  Yes   Patient/family provided with [facility] Clinical Social Work Department's list of facilities offering this level of care within the geographic area requested by the patient (or if unable, by the patient's family).  Yes   Patient/family informed of their freedom to choose among providers that offer the needed level of care, that participate in Medicare, Medicaid or managed care program needed by the patient, have an available bed and are willing to accept the patient.  Yes   Patient/family informed of [facility]'s ownership interest in [facility] and [facility], as well as of the fact that they are under no obligation to receive care at these facilities.  PASRR submitted to EDS on       PASRR number received on       Existing PASRR number confirmed on 12/25/14     FL2 transmitted to all facilities in geographic area requested by pt/family on 12/25/14     FL2 transmitted to all facilities within larger geographic area on       Patient informed that his/her managed care company has contracts with or will negotiate with certain facilities, including the following:        Yes   Patient/family informed of bed offers received.  Patient chooses bed at [facility]     Physician recommends and patient chooses bed at      Patient to be transferred to [facility] on 12/26/14.  Patient to be transferred to facility by [facility]     Patient family notified on 12/26/14 of transfer.  Name of family member notified:  patient's daughter, [name] via phone     PHYSICIAN Please sign FL2     Additional Comment:    _______________________________________________ Arlyss Repress, LCSW 12/26/2014, 2:44 PM

## 2014-12-26 NOTE — Discharge Summary (Addendum)
Physician Discharge Summary  [name] ZOX:[id number] DOB: Nov 17, 1931 DOA: 12/22/2014  PCP: Frederica Kuster, MD  Admit date: 12/22/2014 Discharge date: 12/26/2014  Recommendations for Outpatient Follow-up:  1. F/u with Dr. Annabell Howells in 3-4 weeks for voiding trial at the [facility] office.  Continue finasteride.   2. F/u with vascular surgery, Dr. Arbie Cookey in 2-3 months 3. To SNF for ongoing PT/OT 4. 10 day course of fluconazole given increased yeast on UA after starting antibiotics, last day on August 5th 5. Repeat INR on 7/29 with dose adjustment of Coumadin as needed 6. 14-day course of nitrofurantoin, last day on August 5th.  Discharge Diagnoses:  Principal Problem:   Pyelonephritis, acute Active Problems:   Peripheral vascular disease   COPD (chronic obstructive pulmonary disease)   AAA (abdominal aortic aneurysm) without rupture   Multifactorial dementia   Leg DVT (deep venous thromboembolism), acute   Atrial fibrillation with RVR   Sepsis   Pyelonephritis   Benign localized hyperplasia of prostate with urinary retention   Bladder calculus   Discharge Condition: stable, improved  Diet recommendation: regular  Wt Readings from Last 3 Encounters:  12/26/14 150.4 kg (331 lb 9.2 oz)  10/17/14 145.4 kg (320 lb 8.8 oz)  09/20/14 140.161 kg (309 lb)    History of present illness:  The patient is an 81 yo M with history of cognitive impairment, normal pressure hydrocephalus, left leg DVT diagnosed in December 2014 on anticoagulation, sick sinus syndrome with RVR and chronic Coumadin, cardiomyopathy with ejection fraction of 60% and grade 1 diastolic dysfunction, AAA, infrarenal followed by vascular surgery, chronic venous insufficiency, severe spinal stenosis status post decompressive laminectomy at L4-L5, COPD, generalized weakness who presented with pyelonephritis and bladder stone.  Hospital Course:   Pyelonephritis, acute likely secondary to BPH and noncompliance on  medications and catheter-associated UTI, present at time of admission.  Complicated in setting of recent use of multiple antibiotics and urinary retention and large bladder stone.  New foley catheter was been placed in the emergency room.  His urine culture grew only enterococcus and large amount of yeast, likely he had partially treated his infection with his recent antibiotics.  He was narrowed to nitrofurantoin and fluconazole was added.  Appreciate Dr. Annabell Howells assistance.  Planning to have him take finasteride for 3-4 weeks before following up with urology at which time his options regarding BPH and bladder stone will be discussed.  Anticipate voiding trial at his follow-up appointment.    Sepsis, initially treated with broad-spectrum antibiotics but narrowed to Macrobid on 7/26 after his urine culture was speciated and sensitivities reported.  Mild acute kidney injury, creatinine trended down from 1.35-0.88 with IV fluids.  Euvolemic diastolic heart failure, continue daily weights. Call his primary care doctor if he gains more than 3 pounds in 1 day or 5 pounds in 7 days.  AAA (abdominal aortic aneurysm) without rupture.  Follow up with Dr. early of vascular surgery as outpatient for ongoing surveillance.   Multifactorial dementia-very mild.  Minimize use of anticholinergic medications.  Primary care doctor to continue surveillance as an outpatient.  Leg DVT (deep venous thromboembolism).  He was noncompliant with his warfarin but resumed his medication during hospitalization and his INR was therapeutic at the time of discharge.  He will need close outpatient monitoring of his INR secondary to his fluconazole Macrobid. Recommend repeat INR on 7/29.  Last 3 INRs 2.25/2.16/2.23, Coumadin dose of 6 mg on 7/24, 5 mg on 7/25, and 6 mg on 7/26.  Persistent atrial fibrillation, flipped into RVR a.m. 7/24.  He was given Cardizem and metoprolol and reverted to rate controlled atrial fibrillation. He is on  chronic anticoagulation with a chads 2 score of at least 4.    Peripheral vascular disease, stable.    COPD (chronic obstructive pulmonary disease), stable.  Consultants:  Dr. Annabell Howells Urology  Procedures:  Ct abd pelv No acute finding abdomen or pelvis. Small bilateral nonobstructing renal stones. Urinary bladder stone, unchanged compared the prior exam. Prostatomegaly. No change in a 4.4 cm infrarenal abdominal aortic aneurysm. Recommend followup by ultrasound in 1 year. This recommendation  Antibiotics:  Vancomycin 7/23-7/25  Zosyn 7/23-->7/26  Macrodantin 7/26 >   Fluconazole 7/27 >   Discharge Exam: Filed Vitals:   12/26/14 0658  BP: 135/60  Pulse: 67  Temp: 97.6 F (36.4 C)  Resp: 20   Filed Vitals:   12/25/14 0526 12/25/14 1536 12/25/14 2100 12/26/14 0658  BP: 152/63 136/53 129/52 135/60  Pulse: 58 56 64 67  Temp: 98.7 F (37.1 C) 98.2 F (36.8 C) 98.7 F (37.1 C) 97.6 F (36.4 C)  TempSrc: Oral Oral Oral Oral  Resp: Height:      Weight:    150.4 kg (331 lb 9.2 oz)  SpO2: 98% 98% 97% 97%    General: Adult male, NAD Cardiovascular: IRRR Respiratory: CTAB ABD:  NABS, soft, ND/NT GU:  Leaking some urine around his catheter MSK:  No LEE  Discharge Instructions      Discharge Instructions    Call MD for:  difficulty breathing, headache or visual disturbances    Complete by:  As directed      Call MD for:  extreme fatigue    Complete by:  As directed      Call MD for:  hives    Complete by:  As directed      Call MD for:  persistant dizziness or light-headedness    Complete by:  As directed      Call MD for:  persistant nausea and vomiting    Complete by:  As directed      Call MD for:  severe uncontrolled pain    Complete by:  As directed      Call MD for:  temperature >100.4    Complete by:  As directed      Diet general    Complete by:  As directed      Increase activity slowly    Complete by:  As directed              Medication List    STOP taking these medications        amoxicillin-clavulanate 875-125 MG per tablet  Commonly known as:  AUGMENTIN     doxycycline 100 MG tablet  Commonly known as:  VIBRA-TABS     furosemide 80 MG tablet  Commonly known as:  LASIX     potassium chloride 10 MEQ tablet  Commonly known as:  K-DUR     trimethoprim 100 MG tablet  Commonly known as:  TRIMPEX      TAKE these medications        acetaminophen 500 MG tablet  Commonly known as:  TYLENOL  Take 1,000 mg by mouth every 6 (six) hours as needed (pain).     diltiazem 120 MG 24 hr capsule  Commonly known as:  CARDIZEM CD  Take 120 mg by mouth daily.     doxazosin 8 MG tablet  Commonly known as:  CARDURA  Take 8 mg by mouth daily.     finasteride 5 MG tablet  Commonly known as:  PROSCAR  Take 5 mg by mouth at bedtime.     fluconazole 100 MG tablet  Commonly known as:  DIFLUCAN  Take 1 tablet (100 mg total) by mouth daily.  Start taking on:  12/27/2014     latanoprost 0.005 % ophthalmic solution  Commonly known as:  XALATAN  Place 1 drop into both eyes at bedtime.     metoprolol tartrate 25 MG tablet  Commonly known as:  LOPRESSOR  Take 0.5 tablets (12.5 mg total) by mouth 2 (two) times daily.     nitrofurantoin (macrocrystal-monohydrate) 100 MG capsule  Commonly known as:  MACROBID  Take 1 capsule (100 mg total) by mouth every 12 (twelve) hours.     warfarin 5 MG tablet  Commonly known as:  COUMADIN  Take 1 tablet (5 mg total) by mouth daily at 6 PM.     ZINC OXIDE EX  Apply 1 application topically once a week. Infused wrap for legs       Follow-up Information    Follow up with Anner Crete, MD.   Specialty:  Urology   Why:  He needs f/u with me in our [place] office for a voiding trial in 3-4 weeks.    Contact information:   [address] [place] [address] [phone]       Follow up with Frederica Kuster, MD. Schedule an appointment as soon as possible  for a visit in 2 weeks.   Specialty:  Family Medicine   Contact information:   [address] [place] [address] [phone]       Follow up with Early, Tawanna Cooler, MD. Schedule an appointment as soon as possible for a visit in 2 months.   Specialties:  Vascular Surgery, Cardiology   Contact information:   [address] [place] [address] [phone]        The results of significant diagnostics from this hospitalization (including imaging, microbiology, ancillary and laboratory) are listed below for reference.    Significant Diagnostic Studies: Ct Abdomen Pelvis Wo Contrast  12/22/2014   CLINICAL DATA:  Status post fall 2 days ago. Hip pain. Bed sore. Initial encounter.  EXAM: CT ABDOMEN AND PELVIS WITHOUT CONTRAST  TECHNIQUE: Multidetector CT imaging of the abdomen and pelvis was performed following the standard protocol without IV contrast.  COMPARISON:  CT abdomen and pelvis 03/19/2014.  FINDINGS: Dependent atelectasis is seen lung bases. There is no pleural or pericardial effusion. Heart size is normal.  Bilateral renal cysts are identified and unchanged. 2 right and 1 left nonobstructing renal stone are identified. There is no hydronephrosis and no ureteral stones are seen. The urinary bladder is nearly completely decompressed with a Foley catheter in place. Stone within the urinary bladder measuring 2.6 cm in diameter is again seen as on the prior study. The prostate gland is enlarged.  The patient is status post cholecystectomy. The liver, spleen and adrenal glands appear normal. The pancreas is fatty replaced, unchanged.  Aortoiliac atherosclerosis is identified. 4.4 cm infrarenal abdominal aortic aneurysm is unchanged in appearance. No hemorrhage is identified. Small umbilical hernia is noted. The stomach, small and large bowel and appendix appear normal.  Healed right intertrochanteric fracture is seen with fixation hardware in place. No acute fracture is identified. Lumbar spondylosis  appears most notable at L5-S1. No lytic or sclerotic bony  lesion is seen.  IMPRESSION: No acute finding abdomen or pelvis.  Small bilateral nonobstructing renal stones.  Urinary bladder stone, unchanged compared the prior exam.  Prostatomegaly.  No change in a 4.4 cm infrarenal abdominal aortic aneurysm. Recommend followup by ultrasound in 1 year. This recommendation follows ACR consensus guidelines: White Paper of the ACR Incidental Findings Committee II on Vascular Findings. J Am Coll Radiol 2013; 10:789-794.   Electronically Signed   By: Drusilla Kanner M.D.   On: 12/22/2014 12:47   Dg Chest 2 View  12/22/2014   CLINICAL DATA:  Status post fall 2 days ago with left hip pain. Initial encounter.  EXAM: CHEST  2 VIEW  COMPARISON:  PA and lateral chest 10/14/2014.  FINDINGS: The lungs are clear. Heart size is normal. No pneumothorax or pleural effusion is identified. No focal bony abnormality is seen.  IMPRESSION: No acute disease.   Electronically Signed   By: Drusilla Kanner M.D.   On: 12/22/2014 12:24   Ct Head Wo Contrast  12/22/2014   CLINICAL DATA:  Fall, trauma  EXAM: CT HEAD WITHOUT CONTRAST  TECHNIQUE: Contiguous axial images were obtained from the base of the skull through the vertex without intravenous contrast.  COMPARISON:  MRI 09/18/2013  FINDINGS: Mild cortical volume loss noted with disproportionate ventricular prominence which may suggest an element of hydrocephalus but is subjectively stable. Soft tissue material within the external auditory canals is compatible with cerumen. Mild ethmoid mucoperiosteal thickening. No osseous destruction. Areas of periventricular white matter hypodensity are most compatible with small vessel ischemic change. No acute hemorrhage, infarct, or mass lesion is identified. Encephalomalacia, left cerebellum, image 8.  IMPRESSION: No acute intracranial abnormality.  Proportional prominence of the ventricles as compared to the degree of cortical volume loss which may  suggest an element of hydrocephalus but is stable.   Electronically Signed   By: Christiana Pellant M.D.   On: 12/22/2014 12:41   Dg Hip Unilat With Pelvis 2-3 Views Left  12/22/2014   CLINICAL DATA:  Status post fall 2 days ago. Left hip pain. Initial encounter.  EXAM: DG HIP (WITH OR WITHOUT PELVIS) 2-3V LEFT  COMPARISON:  CT abdomen and pelvis 03/19/2014.  FINDINGS: No acute bony or joint abnormality is identified. The patient is status post fixation of a healed right intertrochanteric fracture. There is no notable degenerative change about the hips. Calcifications centrally in the pelvis correlates with the urinary bladder stone seen on the prior CT.  IMPRESSION: No acute abnormality.  Healed right intertrochanteric fracture.  Urinary bladder stone.   Electronically Signed   By: Drusilla Kanner M.D.   On: 12/22/2014 12:26    Microbiology: Recent Results (from the past 240 hour(s))  MRSA PCR Screening     Status: None   Collection Time: 12/22/14  9:40 AM  Result Value Ref Range Status   MRSA by PCR NEGATIVE NEGATIVE Final    Comment:        The GeneXpert MRSA Assay (FDA approved for NASAL specimens only), is one component of a comprehensive MRSA colonization surveillance program. It is not intended to diagnose MRSA infection nor to guide or monitor treatment for MRSA infections.   Urine culture     Status: None   Collection Time: 12/22/14  9:54 AM  Result Value Ref Range Status   Specimen Description URINE, RANDOM  Final   Special Requests NONE  Final   Culture   Final    80,000 COLONIES/ml YEAST Performed at [facility]    Report Status 12/24/2014 FINAL  Final  Blood Culture (routine x 2)     Status: None (Preliminary result)   Collection Time: 12/22/14 11:07 AM  Result Value Ref Range Status   Specimen Description BLOOD LEFT ANTECUBITAL  Final   Special Requests   Final    BOTTLES DRAWN AEROBIC AND ANAEROBIC 10CC BOTH BOTTLES   Culture   Final    NO GROWTH 3  DAYS Performed at [facility]    Report Status PENDING  Incomplete  Blood Culture (routine x 2)     Status: None (Preliminary result)   Collection Time: 12/22/14 11:16 AM  Result Value Ref Range Status   Specimen Description BLOOD BLOOD RIGHT HAND  Final   Special Requests   Final    BOTTLES DRAWN AEROBIC AND ANAEROBIC 10CC BOTH BOTTLES   Culture   Final    NO GROWTH 3 DAYS Performed at [facility]    Report Status PENDING  Incomplete  Culture, Urine     Status: None   Collection Time: 12/22/14  2:00 PM  Result Value Ref Range Status   Specimen Description URINE, CATHETERIZED  Final   Special Requests   Final    Patient has been on recently Augmentin and as well as doxycycline and please get this specimen from the one that was done from the new Foley Immunocompromised   Culture   Final    10,000 COLONIES/mL YEAST 10,000 COLONIES/mL ENTEROCOCCUS SPECIES Performed at [facility]    Report Status 12/26/2014 FINAL  Final   Organism ID, Bacteria ENTEROCOCCUS SPECIES  Final      Susceptibility   Enterococcus species - MIC*    AMPICILLIN <=2 SENSITIVE Sensitive     LEVOFLOXACIN >=8 RESISTANT Resistant     NITROFURANTOIN <=16 SENSITIVE Sensitive     VANCOMYCIN 1 SENSITIVE Sensitive     LINEZOLID 2 SENSITIVE Sensitive     * 10,000 COLONIES/mL ENTEROCOCCUS SPECIES  Urine culture     Status: None   Collection Time: 12/24/14  9:23 AM  Result Value Ref Range Status   Specimen Description URINE, CATHETERIZED  Final   Special Requests NONE  Final   Culture   Final    >=100,000 COLONIES/mL YEAST Performed at [facility]    Report Status 12/25/2014 FINAL  Final     Labs: Basic Metabolic Panel:  Recent Labs Lab 12/22/14 1045 12/22/14 1550 12/23/14 0415 12/24/14 0406 12/26/14 0440  NA 140 142 140 141 143  K 3.9 4.6 4.0 4.0 4.1  CL 106 109 111 112* 113*  CO2 27 27 24 24 26   GLUCOSE 127* 137* 92 103* 108*  BUN 14 14 16 20 12   CREATININE 1.26*  1.25* 1.14 1.20 0.88  CALCIUM 8.6* 8.4* 8.4* 8.3* 8.6*   Liver Function Tests:  Recent Labs Lab 12/22/14 1045 12/22/14 1550 12/23/14 0415 12/24/14 0406 12/26/14 0440  AST 19 23 15 16 19   ALT 13* 14* 11* 13* 16*  ALKPHOS 66 59 57 51 55  BILITOT 1.5* 1.3* 1.0 0.8 0.5  PROT 6.6 6.3* 5.9* 6.0* 6.1*  ALBUMIN 3.4* 3.0* 2.8* 3.0* 2.8*   No results for input(s): LIPASE, AMYLASE in the last 168 hours. No results for input(s): AMMONIA in the last 168 hours. CBC:  Recent Labs Lab 12/22/14 1045 12/22/14 1550 12/23/14 0415 12/24/14 0406 12/26/14 0440  WBC 13.0* 18.8* 14.2* 8.5 7.5  NEUTROABS 11.4*  --   --   --   --  HGB 12.2* 11.7* 11.4* 10.4* 11.0*  HCT 38.0* 36.5* 36.2* 32.3* 34.7*  MCV 90.5 91.3 92.6 91.8 92.0  PLT 220 227 206 203 260   Cardiac Enzymes: No results for input(s): CKTOTAL, CKMB, CKMBINDEX, TROPONINI in the last 168 hours. BNP: BNP (last 3 results) No results for input(s): BNP in the last 8760 hours.  ProBNP (last 3 results) No results for input(s): PROBNP in the last 8760 hours.  CBG: No results for input(s): GLUCAP in the last 168 hours.  Time coordinating discharge: 35 minutes  Signed:  Katilin Raynes  [facility] 12/26/2014, 2:01 PM

## 2014-12-26 NOTE — Progress Notes (Addendum)
Patient ID: [name], male   DOB: Nov 19, 1931, 81 y.o.   MRN: [id number]    Subjective: [name] is clinically improved.  He is tolerating the foley.  He has >100000 yeast on his urine culture but the BCx are negative.   He is being discharged to the [facility] today.   ROS:  Review of Systems  Constitutional: Negative for fever and chills.  Gastrointestinal: Negative for abdominal pain.  Genitourinary: Negative for hematuria.       He is having some leakage around the foley.   Neurological:       He remains weak and unable to walk.     Anti-infectives: Anti-infectives    Start     Dose/Rate Route Frequency Ordered Stop   12/23/14 1400  vancomycin (VANCOCIN) 1,750 mg in sodium chloride 0.9 % 500 mL IVPB  Status:  Discontinued     1,750 mg 250 mL/hr over 120 Minutes Intravenous Every 24 hours 12/22/14 1227 12/24/14 0950   12/22/14 1800  piperacillin-tazobactam (ZOSYN) IVPB 3.375 g  Status:  Discontinued     3.375 g 12.5 mL/hr over 240 Minutes Intravenous Every 8 hours 12/22/14 1227 12/25/14 1011   12/22/14 1230  vancomycin (VANCOCIN) 1,500 mg in sodium chloride 0.9 % 500 mL IVPB     1,500 mg 250 mL/hr over 120 Minutes Intravenous STAT 12/22/14 1227 12/22/14 1447   12/22/14 1015  piperacillin-tazobactam (ZOSYN) IVPB 3.375 g     3.375 g 100 mL/hr over 30 Minutes Intravenous  Once 12/22/14 1001 12/22/14 1046   12/22/14 1015  vancomycin (VANCOCIN) IVPB 1000 mg/200 mL premix     1,000 mg 200 mL/hr over 60 Minutes Intravenous  Once 12/22/14 1001 12/22/14 1137      Current Facility-Administered Medications  Medication Dose Route Frequency Provider Last Rate Last Dose  . diltiazem (CARDIZEM) tablet 30 mg  30 mg Oral 4 times per day Rhetta Mura, MD   30 mg at 12/26/14 0518  . doxazosin (CARDURA) tablet 8 mg  8 mg Oral Daily Rhetta Mura, MD   8 mg at 12/25/14 1146  . finasteride (PROSCAR) tablet 5 mg  5 mg Oral QHS Rhetta Mura, MD   5 mg at 12/25/14 2304  .  latanoprost (XALATAN) 0.005 % ophthalmic solution 1 drop  1 drop Both Eyes QHS Rhetta Mura, MD   1 drop at 12/25/14 2304  . metoprolol tartrate (LOPRESSOR) tablet 12.5 mg  12.5 mg Oral BID Rhetta Mura, MD   12.5 mg at 12/25/14 2300  . nitrofurantoin (macrocrystal-monohydrate) (MACROBID) capsule 100 mg  100 mg Oral Q12H Rhetta Mura, MD   100 mg at 12/25/14 2303  . sodium chloride 0.9 % injection 3 mL  3 mL Intravenous Q12H Rhetta Mura, MD   3 mL at 12/24/14 2143  . Warfarin - Pharmacist Dosing Inpatient   Does not apply q1800 Rhetta Mura, MD   0  at 12/23/14 1800     Objective: Vital signs in last 24 hours: Temp:  [97.6 F (36.4 C)-98.7 F (37.1 C)] 97.6 F (36.4 C) (07/27 0658) Pulse Rate:  [56-67] 67 (07/27 0658) Resp:  [20] 20 (07/27 0658) BP: (129-136)/(52-60) 135/60 mmHg (07/27 0658) SpO2:  [97 %-98 %] 97 % (07/27 0658) Weight:  [150.4 kg (331 lb 9.2 oz)] 150.4 kg (331 lb 9.2 oz) (07/27 0658)  Intake/Output from previous day: 07/26 0701 - 07/27 0700 In: -  Out: 950 [Urine:950] Intake/Output this shift: Total I/O In: -  Out: 400 [Urine:400]  Physical Exam  Constitutional: He is well-developed, well-nourished, and in no distress.  Genitourinary:  Urine clear in foley.   Vitals reviewed.   Lab Results:   Recent Labs  12/24/14 0406 12/26/14 0440  WBC 8.5 7.5  HGB 10.4* 11.0*  HCT 32.3* 34.7*  PLT 203 260   BMET  Recent Labs  12/24/14 0406 12/26/14 0440  NA 141 143  K 4.0 4.1  CL 112* 113*  CO2 24 26  GLUCOSE 103* 108*  BUN 20 12  CREATININE 1.20 0.88  CALCIUM 8.3* 8.6*   PT/INR  Recent Labs  12/25/14 0412 12/26/14 0440  LABPROT 23.9* 24.5*  INR 2.16* 2.23*   ABG No results for input(s): PHART, HCO3 in the last 72 hours.  Invalid input(s): PCO2, PO2  Studies/Results: No results found.  Lab results and hospitalist notes reviewed.   Assessment and Plan: Benign localized hyperplasia of prostate  with urinary retention [name] is tolerating the foley and his urine is clear.   He has some leakage around the foley, possibly from spasm, but the foley is draining.    He is to be discharged to the [facility] today.    I discussed the options with him for treatment of the retention and stone included continued catheter drainage longer term, catheter drainage for the next 3-4 weeks with a voiding trial after he has had time for the finasteride to start working again or TURP with cystolithalopaxy.    At this time I will have him set up to see me at our [place] office in 3-4 weeks for a voiding trial.    Bladder calculus He had a CT this admission which shows a large prostate and a 2.6cm bladder stone.  The stone has been present since at least 2011 and has grown from 1.6cm at that time.  He didn't have the stone when I first saw him in 2008 as best I can tell and I didn't do any bladder imaging myself so I was unaware that it had been present.    Unless he improves significantly and becomes a better surgical candidate, I will just monitor the stone.          LOS: 4 days    Anner Crete 12/26/2014

## 2014-12-26 NOTE — Progress Notes (Signed)
ANTICOAGULATION CONSULT NOTE - Follow Up Consult  Pharmacy Consult for warfarin Indication: Hx DVT, Afib  Allergies  Allergen Reactions  . Oxycodone Hcl Other (See Comments)     makes pt "wild"  . Propoxyphene N-Acetaminophen Other (See Comments)     Makes pt. "wild"    Patient Measurements: Height:  (198.1 cm) Weight: (!) 331 lb 9.2 oz (150.4 kg) IBW/kg (Calculated) : 91.4  Vital Signs: Temp: 97.6 F (36.4 C) (07/27 0658) Temp Source: Oral (07/27 0658) BP: 135/60 mmHg (07/27 0658) Pulse Rate: 67 (07/27 0658)  Labs:  Recent Labs  12/24/14 0406 12/25/14 0412 12/26/14 0440  HGB 10.4*  --  11.0*  HCT 32.3*  --  34.7*  PLT 203  --  260  LABPROT 24.6* 23.9* 24.5*  INR 2.25* 2.16* 2.23*  CREATININE 1.20  --  0.88    Estimated Creatinine Clearance: 107.1 mL/min (by C-G formula based on Cr of 0.88).   Medications:  Prescriptions prior to admission  Medication Sig Dispense Refill Last Dose  . acetaminophen (TYLENOL) 500 MG tablet Take 1,000 mg by mouth every 6 (six) hours as needed (pain).   12/21/2014 at Unknown time  . diltiazem (CARDIZEM CD) 120 MG 24 hr capsule Take 120 mg by mouth daily.    12/21/2014 at Unknown time  . doxazosin (CARDURA) 8 MG tablet Take 8 mg by mouth daily.   12/21/2014 at Unknown time  . finasteride (PROSCAR) 5 MG tablet Take 5 mg by mouth at bedtime.    12/21/2014 at Unknown time  . furosemide (LASIX) 80 MG tablet Take 1 tablet by mouth daily.   12/21/2014 at Unknown time  . latanoprost (XALATAN) 0.005 % ophthalmic solution Place 1 drop into both eyes at bedtime.    12/21/2014 at Unknown time  . potassium chloride (K-DUR) 10 MEQ tablet Take 1 tablet (10 mEq total) by mouth daily. 30 tablet 5 12/21/2014 at Unknown time  . trimethoprim (TRIMPEX) 100 MG tablet Take 1 tablet (100 mg total) by mouth daily. 30 tablet 1 12/21/2014 at Unknown time  . warfarin (COUMADIN) 1 MG tablet Take 1 mg by mouth daily. Take one tablet by mouth on Sunday, Tuesday,  Thursday, and Saturday with  tablet that is taken daily   12/20/2014  . warfarin (COUMADIN) 5 MG tablet Take 1 tablet (5 mg total) by mouth daily. (Patient taking differently: Take 5 mg by mouth daily with supper. On Tues, Thurs, Sat and Sun pt takes additional 1 mg tablet) 30 tablet 2 12/21/2014 at 1800  . ZINC OXIDE EX Apply 1 application topically once a week. Infused wrap for legs   12/19/2014  . amoxicillin-clavulanate (AUGMENTIN) 875-125 MG per tablet Take 1 tablet by mouth 2 (two) times daily. (Patient not taking: Reported on 12/22/2014) 20 tablet 0   . doxycycline (VIBRA-TABS) 100 MG tablet Take 1 tablet (100 mg total) by mouth 2 (two) times daily. (Patient not taking: Reported on 12/22/2014) 20 tablet 0    Scheduled:  . diltiazem  30 mg Oral 4 times per day  . doxazosin  8 mg Oral Daily  . finasteride  5 mg Oral QHS  . [START ON 12/27/2014] fluconazole  100 mg Oral Daily  . latanoprost  1 drop Both Eyes QHS  . metoprolol tartrate  12.5 mg Oral BID  . nitrofurantoin (macrocrystal-monohydrate)  100 mg Oral Q12H  . sodium chloride  3 mL Intravenous Q12H  . Warfarin - Pharmacist Dosing Inpatient   Does not apply [phone]  Assessment: 81yo obese M admitted with fever and hip pain after falling at home. Treating for possible sepsis. Xray was negative for fracture. CT of the head was negative for hemorrhage. Pharmacy is asked to dose warfarin while inpatient.   Baseline INR: therapeutic  Prior anticoagulation: warfarin 6 mg daily except 5 mg MWF, last dose 7/22  Significant events:  Today, 12/26/2014:  CBC:  (7/25) Hgb low but appears stable  INR  = therapeutic  Major drug interactions: none  No bleeding issues per nursing  Eating 100% of meals  Goal of Therapy: INR 2-3  Plan:  Warfarin 5 mg PO tonight at 18:00  Suggest resuming home warfarin dosing at discharge, if fluconazole were to be started in patient he will need very close INR follow-ups while on therapy as dose  reduction will likely be necessary  Daily INR  Monitor for signs of bleeding or thrombosis  Juliette Alcide, PharmD, BCPS.   Pager: [phone] 12/26/2014, 1:48 PM

## 2014-12-27 LAB — CULTURE, BLOOD (ROUTINE X 2)
Culture: NO GROWTH
Culture: NO GROWTH

## 2014-12-28 ENCOUNTER — Non-Acute Institutional Stay (SKILLED_NURSING_FACILITY): Payer: Medicare Other | Admitting: Internal Medicine

## 2014-12-28 ENCOUNTER — Encounter (HOSPITAL_COMMUNITY)
Admission: AD | Admit: 2014-12-28 | Discharge: 2014-12-28 | Disposition: A | Discharge status: Home / Self Care | Payer: Medicare Other | Source: Skilled Nursing Facility | Attending: Internal Medicine | Admitting: Internal Medicine

## 2014-12-28 ENCOUNTER — Encounter: Payer: Self-pay | Admitting: Internal Medicine

## 2014-12-28 DIAGNOSIS — N210 Calculus in bladder: Secondary | ICD-10-CM

## 2014-12-28 DIAGNOSIS — I5030 Unspecified diastolic (congestive) heart failure: Secondary | ICD-10-CM | POA: Diagnosis not present

## 2014-12-28 DIAGNOSIS — I82409 Acute embolism and thrombosis of unspecified deep veins of unspecified lower extremity: Secondary | ICD-10-CM | POA: Diagnosis not present

## 2014-12-28 DIAGNOSIS — N10 Acute tubulo-interstitial nephritis: Secondary | ICD-10-CM

## 2014-12-28 LAB — PROTIME-INR
INR: 1.96 — AB (ref 0.00–1.49)
Prothrombin Time: 22.3 seconds — ABNORMAL HIGH (ref 11.6–15.2)

## 2014-12-28 LAB — CBC
HCT: 37.6 % — ABNORMAL LOW (ref 39.0–52.0)
Hemoglobin: 11.9 g/dL — ABNORMAL LOW (ref 13.0–17.0)
MCH: 29 pg (ref 26.0–34.0)
MCHC: 31.6 g/dL (ref 30.0–36.0)
MCV: 91.7 fL (ref 78.0–100.0)
Platelets: 282 10*3/uL (ref 150–400)
RBC: 4.1 MIL/uL — ABNORMAL LOW (ref 4.22–5.81)
RDW: 15.6 % — ABNORMAL HIGH (ref 11.5–15.5)
WBC: 7.8 10*3/uL (ref 4.0–10.5)

## 2014-12-28 LAB — COMPREHENSIVE METABOLIC PANEL
ALK PHOS: 65 U/L (ref 38–126)
ALT: 28 U/L (ref 17–63)
AST: 22 U/L (ref 15–41)
Albumin: 3.1 g/dL — ABNORMAL LOW (ref 3.5–5.0)
Anion gap: 7 (ref 5–15)
BILIRUBIN TOTAL: 0.5 mg/dL (ref 0.3–1.2)
BUN: 13 mg/dL (ref 6–20)
CO2: 26 mmol/L (ref 22–32)
Calcium: 8.8 mg/dL — ABNORMAL LOW (ref 8.9–10.3)
Chloride: 107 mmol/L (ref 101–111)
Creatinine, Ser: 0.92 mg/dL (ref 0.61–1.24)
GFR calc Af Amer: >60 mL/min (ref >60)
Glucose, Bld: 140 mg/dL — ABNORMAL HIGH (ref 65–99)
POTASSIUM: 4.1 mmol/L (ref 3.5–5.1)
Sodium: 140 mmol/L (ref 135–145)
Total Protein: 6.5 g/dL (ref 6.5–8.1)

## 2014-12-28 NOTE — Progress Notes (Signed)
Patient ID: [name], male   DOB: 03-13-1932, 81 y.o.   MRN: [id number]         FACILITY: [facility]       LEVEL OF CARE:   SNF  This is an acute visit   CHIEF COMPLAINT:    acute visit status post hospitalization for UTI and bladder stone.        HISTORY OF PRESENT ILLNESS:   Patient is a pleasant 81 year old male   When he was here in April/May, he had been in hospital for recurrent DVTs, new onset atrial fibrillation, neuropathy, and mild dementia.  He was noted to have pneumonia and congestive heart failure.   he also had significant bilateral venous stasis wounds which were followed by  Wound care.   on this occasion he was hospitalized presenting with a bladder stone and a UTI.   his urine culture grew out enterococcus and yeast -she had been treated previously with Tobi Bastos biotics.   During hospitalization and buttocks were narrowed to Macrobid and Diflucan.   he was seen by urology.   He is also on Proscar for 3 or 4 weeks with follow-up by urology for his BPH and bladder stone.   His creatinine apparently was slightly elevated as well A trended down from 1.85 down to 0.88 with IV fluids.  Regards to diastolic CHF this apparently was stable in the hospital I note he is not on Lasix or diaphoretic at this time this will have to be monitored.   he continues with multi factoral dementia thought to be mild-to-moderate.  Patient does again have a history of  A DVT -apparently had been noncompliant with his Coumadin at home but this was restarted during hospitalization.   This will have to be watched closely with his Diflucan and Macrobid use.        PAST MEDICAL HISTORY/PROBLEM LIST  UTI.  History of bladder stone :                       Atrial fibrillation.    COPD.    Severe bilateral osteoarthritis of the knees.    Chronic venous insufficiency with venous insufficiency wounds.    Urinary frequency and incontinence.    Some degree of  cognitive impairment.    History of recurrent DVTs.    Peripheral vascular disease.     Glaucoma.    CURRENT MEDICATIONS:  Medication list is reviewed.                         Tylenol 1000 mg every 6 hours when necessary.  Diltiazem 120 mg daily.  Cardura 8 mg daily.  Proscar 5 mg daily at bedtime.  Diflucan 100 mg by mouth daily.  Xalatan- 1 drop both eyes daily at bedtime.  Lopressor 12.5 mg twice a day.  Macrobid  100 mg twice a day.  Coumadin 5 mg by mouth daily.  Zinc oxide topically once a week to legs.            SOCIAL HISTORY:                   HOUSING:  Lives in [place] with his wife.   FUNCTIONAL STATUS:  They have an in-home caregiver.   As mentioned, he is no longer ambulatory.  Has a wheelchair             REVIEW OF SYSTEMS:   General no  complaints of fever or chills.  Skin does not complain of rashes or itching has venous stasis wounds lower extremities that are chronic  Head ears eyes nose mouth and throat-  does not complain of visual changes or sore throat         CHEST/RESPIRATORY:  He is not complaining of shortness of breath  or cough -he does have a listed history of COPD     CARDIAC:  No chest pain.    GI:-- does not complain of abdominal pain nausea vomiting diarrhea or constipation.  GU extensive history as noted above but is not complaining of pain or discomfort at this time.  Muscle skeletal does have significant lower extremity weakness but does not complaining of pain.  Neurologic does not complain of dizziness headache or syncopal-type feelings.   psych does have a history of mild-to-moderate dementia is not complaining of anxiety or depression       PHYSICAL EXAMINATION:   VITAL SIGNS:      temperature 98.5 pulse 80 respirations 20 blood pressure 144/58-117/68 in this range weight is 325   GENERAL APPEARANCE:  The patient is not in any distress. [place] about in his wheelchair    his skin is warm and dry he does  have venous stasis wounds lower extremity bilaterally these are currently wrapped and followed by wound care per wound care these are stable there is a open area left lateral ankle that apparently is improving.   oropharynx is clear mucous membranes moist   eyes pupils appear reactive visual acuity appears intact.   CHEST/RESPIRATORY:  Decreased air entry bilaterally, but no crackles or wheezes. There are scatteredbronchial breath sounds  His work of breathing is normal.   CARDIOVASCULAR:   CARDIAC:   Regular rate and rhythm without murmur gallop or rub he has significant lymphedema bilaterally that appears to be baseline.   GASTROINTESTINAL:   ABDOMEN: Obese with positive bowel sounds it is nontender to palpation    .     CIRCULATION:   EDEMA/VARICOSITIES:  Significant venous stasis. Currently his legs are wrapped bilaterally  Muscle skeletal-does move all extremitiest 4 is ambulatory and wheelchair this appears to be baseline.  Neurologic appears grossly intact his speech is clear there are no lateralizing findings.  Psych he has a history of dementia but is pleasant and appropriate is conversant.  Labs.   12/28/2014.  INR 1.96.   appears he had run in the low twos during hospitalization.  WBC 7.8 hemoglobin 11.9 platelets 282. Sodium 140 potassium 4.1 CO2 2  08/30/2014.  Sodium 141 potassium 4.5 BUN 23 creatinine 1.08.  INR 2.78.  WBC 7.0 hemoglobin 13.9 platelets 203  08/27/2014.  Sodium 141 potassium 4.2 BUN 21 creatinine 1.05.  INR-3.05.  08/15/2014.  WBC 6.6 hemoglobin 13.9 platelets 233.     V     ASSESSMENT/PLAN:                                                  history of UTIs and bladder stone-this is followed closely by urology he is now on Macrobid as well as Diflucan for suspected yeast infection -- clinically appears to be stable he is afebrile is not really overtly complaining of dysuria--   history of BPH-he is on Proscar as well as  Cardura        Atrial fibrillation  with a history of recurrent DVTs, I believe.  He is on Coumadin.--He is on Cardizem-rate appears to be controlled -- INR is therapeutic but will have to be watched -will check an INR tomorrow       COPD.-- at this point appears to be stable  .    CHF.    clinically this appears stable but he is now off diuretic this will have to be watched closely with weights notify provider of any gain greater than 3 pounds   history of venous stasis wounds this is followed by wound care as noted above this will have to be monitored    History of dementia this appears to be mild-to-moderate is pleasant and appropriate today but does have episodes of confusion.   History of mild acute kidney injury this appears to have resolved again will have to monitor I suspect at some point we may have to restart his diuretic.   History of abdominal aortic aneurysm without rupture- this is followed by vascular surgery     of note Will update a CBC and BMP on Monday, August 1 -again his weights and edema will have to be monitored carefully the history of diastolic CHF and his INR will need be checked tomorrow especially in light of him being on and antibiotic and Diflucan.  [id number]--QI note greater than 40 minutes spent assessing patient- discussing his status with nursing staff-reviewing his chart- and coordinating and formulating a plan of care for numerous diagnoses- of note greater than 50% of time spent coordinating plan of care     .

## 2014-12-29 LAB — PROTIME-INR
INR: 2.15 — ABNORMAL HIGH (ref 0.00–1.49)
Prothrombin Time: 23.9 seconds — ABNORMAL HIGH (ref 11.6–15.2)

## 2014-12-31 ENCOUNTER — Encounter (HOSPITAL_COMMUNITY)
Admission: AD | Admit: 2014-12-31 | Discharge: 2014-12-31 | Disposition: A | Discharge status: Home / Self Care | Payer: Medicare Other | Source: Skilled Nursing Facility | Attending: Internal Medicine | Admitting: Internal Medicine

## 2014-12-31 LAB — BASIC METABOLIC PANEL
Anion gap: 4 — ABNORMAL LOW (ref 5–15)
BUN: 19 mg/dL (ref 6–20)
CO2: 28 mmol/L (ref 22–32)
Calcium: 8.6 mg/dL — ABNORMAL LOW (ref 8.9–10.3)
Chloride: 109 mmol/L (ref 101–111)
Creatinine, Ser: 0.85 mg/dL (ref 0.61–1.24)
GFR calc Af Amer: >60 mL/min (ref >60)
GFR calc non Af Amer: >60 mL/min (ref >60)
Glucose, Bld: 97 mg/dL (ref 65–99)
Potassium: 4.1 mmol/L (ref 3.5–5.1)
Sodium: 141 mmol/L (ref 135–145)

## 2014-12-31 LAB — CBC
HCT: 35.5 % — ABNORMAL LOW (ref 39.0–52.0)
Hemoglobin: 11.3 g/dL — ABNORMAL LOW (ref 13.0–17.0)
MCH: 29.1 pg (ref 26.0–34.0)
MCHC: 31.8 g/dL (ref 30.0–36.0)
MCV: 91.5 fL (ref 78.0–100.0)
Platelets: 268 10*3/uL (ref 150–400)
RBC: 3.88 MIL/uL — ABNORMAL LOW (ref 4.22–5.81)
RDW: 15.7 % — ABNORMAL HIGH (ref 11.5–15.5)
WBC: 7.9 10*3/uL (ref 4.0–10.5)

## 2014-12-31 LAB — PROTIME-INR
INR: 2.2 — ABNORMAL HIGH (ref 0.00–1.49)
Prothrombin Time: 24.2 seconds — ABNORMAL HIGH (ref 11.6–15.2)

## 2015-01-03 ENCOUNTER — Ambulatory Visit: Payer: Self-pay | Admitting: Family Medicine

## 2015-01-03 LAB — PROTIME-INR
INR: 2.72 — ABNORMAL HIGH (ref 0.00–1.49)
PROTHROMBIN TIME: 28.4 s — AB (ref 11.6–15.2)

## 2015-01-04 ENCOUNTER — Non-Acute Institutional Stay (SKILLED_NURSING_FACILITY): Payer: Medicare Other | Admitting: Internal Medicine

## 2015-01-04 ENCOUNTER — Encounter: Payer: Self-pay | Admitting: Internal Medicine

## 2015-01-04 DIAGNOSIS — R531 Weakness: Secondary | ICD-10-CM | POA: Diagnosis not present

## 2015-01-04 DIAGNOSIS — I872 Venous insufficiency (chronic) (peripheral): Secondary | ICD-10-CM | POA: Diagnosis not present

## 2015-01-04 DIAGNOSIS — I420 Dilated cardiomyopathy: Secondary | ICD-10-CM

## 2015-01-04 DIAGNOSIS — R4189 Other symptoms and signs involving cognitive functions and awareness: Secondary | ICD-10-CM | POA: Diagnosis not present

## 2015-01-04 DIAGNOSIS — N10 Acute tubulo-interstitial nephritis: Secondary | ICD-10-CM | POA: Diagnosis not present

## 2015-01-04 DIAGNOSIS — Z5181 Encounter for therapeutic drug level monitoring: Secondary | ICD-10-CM | POA: Diagnosis not present

## 2015-01-04 DIAGNOSIS — I480 Paroxysmal atrial fibrillation: Secondary | ICD-10-CM

## 2015-01-04 DIAGNOSIS — F0390 Unspecified dementia without behavioral disturbance: Secondary | ICD-10-CM

## 2015-01-04 DIAGNOSIS — N401 Enlarged prostate with lower urinary tract symptoms: Secondary | ICD-10-CM

## 2015-01-04 DIAGNOSIS — N210 Calculus in bladder: Secondary | ICD-10-CM

## 2015-01-04 DIAGNOSIS — I714 Abdominal aortic aneurysm, without rupture, unspecified: Secondary | ICD-10-CM

## 2015-01-04 DIAGNOSIS — F068 Other specified mental disorders due to known physiological condition: Secondary | ICD-10-CM

## 2015-01-04 DIAGNOSIS — Z7901 Long term (current) use of anticoagulants: Secondary | ICD-10-CM | POA: Diagnosis not present

## 2015-01-04 NOTE — Progress Notes (Signed)
Patient ID: [name], male   DOB: Sep 20, 1931, 81 y.o.   MRN: [id number]    HISTORY AND PHYSICAL  Location:  [facility] Room Number: [id number] Place of Service: SNF (31)   Extended Emergency Contact Information Primary Emergency Contact: [name],[name] Address: [address]          [place], [place] [address] [name] of [place] Phone: [phone] Mobile Phone: [phone] Relation: Daughter Secondary Emergency Contact: Palazzola,[name] Address: [address]          [place], [place] [address] [name] of [place] Phone: [phone] Mobile Phone: [phone] Relation: Son  Advanced Directive information Does patient have an advance directive?: Yes, Type of Advance Directive: Living will;Out of facility DNR (pink MOST or yellow form), Pre-existing out of facility DNR order (yellow form or pink MOST form): Yellow form placed in chart (order not valid for inpatient use), Does patient want to make changes to advanced directive?: No - Patient declined  Chief Complaint  Patient presents with  . New Admit To SNF    Following hospitalization    HPI:  Hospitalized 12/22/2014 through 12/26/2014 at [facility].. 81 year old male was admitted through the emergency room. Patient been treated for pyelonephritis 2515 16 and 10/17/2014 with IV antibiotics. This was followed by Levaquin by mouth. He had a recurrent UTI 11/27/2014 treated with Augmentin and then doxycycline. The last dose was given 12/14/2014. He developed urinary retention following running out of his doxazosin and finasteride. He saw Dr. Roni Bread. On presentation to the emergency room he had a temperature of 102.6 and a heart rate of 108 with atrial fibrillation. It was felt that he had pyelonephritis with sepsis again.  Patient is chronically anticoagulated for previous incidence of pulmonary embolus and DVT as well as his atrial fibrillation. He has a history of cardiomyopathy. There is venous  insufficiency with chronic lower extremity edema for which he had been treated recently with Unna boots. There was previous history of spinal stenosis with decompressive laminectomy at L4-5. He has COPD. There is a history of multiple falls. Multiple other chronic medical illnesses are outlined below.  Patient has a mild dementia.  Patient had Foley catheter placed while hospitalized. This is new for him. Finasteride trial to reach below this.  Because of weakness brought on by this recent illness, he is admitted to the skilled nursing facility for rehabilitation before returning home.  Hospital lab reports are reviewed. A mild anemia is present with hemoglobin 11.9 and MCV 91.7. There is a modest hyperglycemia on several occasions. BUN was always in the normal range, but creatinine range from 1.14-1.26. It was normal at 0.88 at the time of discharge.  INR has been followed regularly since admission to have nursing center. He is now on 5 mg a day of warfarin with an INR of 2.72.   Past Medical History  Diagnosis Date  . Venous insufficiency   . AAA (abdominal aortic aneurysm)   . Ulcer   . DVT (deep venous thrombosis) 05/04/2013    "LLE"  . Cellulitis   . Nicotine dependence   . Bronchospasm   . Elevated PSA   . BPH (benign prostatic hyperplasia)   . COPD (chronic obstructive pulmonary disease)   . Cognitive impairment   . Daily headache   . Arthritis     "probably on the right leg/hip" (09/20/2013)  . Dementia     "don't know exactly what kind" (09/20/2013)  . Kidney stones   .  Glaucoma, both eyes   . Atrial fibrillation   . Fall at home September 18, 2013  . Benign localized hyperplasia of prostate with urinary retention 12/23/2014  . AAA (abdominal aortic aneurysm) without rupture 10/06/2012  . Anticoagulation goal of INR 2 to 3 06/01/2013  . B12 deficiency 09/20/2013  . Bladder calculus 12/23/2014    2.6cm and present since at least 2011 when it was about 1.6cm.   . Cardiomyopathy  10/15/2014  . CHF (congestive heart failure) 09/26/2013  . Chronic venous insufficiency 10/06/2012  . Edema-Bilat Leg and foot 03/07/2013  . Multifactorial dementia 10/06/2012  . Obesity, unspecified 10/06/2012  . Peripheral vascular disease 09/20/2012  . Pyelonephritis, acute 12/22/2014  . Weakness 09/18/2013    Past Surgical History  Procedure Laterality Date  . Cataract extraction w/ intraocular lens  implant, bilateral Bilateral   . Insitu percutaneous pinning femur Right 1986  . Hip fracture surgery Right 2009  . Back surgery    . Posterior laminectomy / decompression lumbar spine  2004    Decompressive laminectomy unilaterally on the right at L4-5 with  . Femur fracture surgery Right 04/1985  . Cystoscopy w/ stone manipulation  1970's    "once"  . Spine surgery    . Eye surgery    . Cholecystectomy  2011    [place] [facility]    Patient Care Team: [name], MD as PCP - General (Family Medicine) Irine Seal, MD as Attending Physician (Urology)  History   Social History  . Marital Status: Married    Spouse Name: N/A  . Number of Children: N/A  . Years of Education: N/A   Occupational History  . Not on file.   Social History Main Topics  . Smoking status: Former Smoker -- 2.00 packs/day for 54 years    Types: Cigarettes    Quit date: 08/24/2002  . Smokeless tobacco: Current User    Types: Snuff     Comment: 09/20/2013 "dips snuff"  . Alcohol Use: No  . Drug Use: No  . Sexual Activity: Not Currently   Other Topics Concern  . Not on file   Social History Narrative     reports that he quit smoking about 12 years ago. His smoking use included Cigarettes. He has a 108 pack-year smoking history. His smokeless tobacco use includes Snuff. He reports that he does not drink alcohol or use illicit drugs.  Family History  Problem Relation Age of Onset  . Aneurysm Brother   . Heart attack Brother     10-29-12  . Hypertension Brother   . Heart disease Brother     AAA  .  Cancer Father     colon   Family Status  Relation Status Death Age  . Brother Deceased   . Father Deceased 25  . Mother Deceased     Facilities manager  . Brother Deceased     Immunization History  Administered Date(s) Administered  . Influenza,inj,Quad PF,36+ Mos 03/20/2013, 03/23/2014  . Pneumococcal Polysaccharide-23 03/02/2007  . Td 12/30/1996    Allergies  Allergen Reactions  . Oxycodone Hcl Other (See Comments)     makes pt "wild"  . Propoxyphene N-Acetaminophen Other (See Comments)     Makes pt. "wild"    Medications: Patient's Medications  New Prescriptions   No medications on file  Previous Medications   ACETAMINOPHEN (TYLENOL) 500 MG TABLET    Take 1,000 mg by mouth every 6 (six) hours as needed (pain).   DILTIAZEM (CARDIZEM CD)  120 MG 24 HR CAPSULE    Take 120 mg by mouth daily.    DOXAZOSIN (CARDURA) 8 MG TABLET    Take 8 mg by mouth daily.   FINASTERIDE (PROSCAR) 5 MG TABLET    Take 5 mg by mouth at bedtime.    FLUCONAZOLE (DIFLUCAN) 100 MG TABLET    Take 1 tablet (100 mg total) by mouth daily.   LATANOPROST (XALATAN) 0.005 % OPHTHALMIC SOLUTION    Place 1 drop into both eyes at bedtime.    METOPROLOL TARTRATE (LOPRESSOR) 25 MG TABLET    Take 0.5 tablets (12.5 mg total) by mouth 2 (two) times daily.   NITROFURANTOIN, MACROCRYSTAL-MONOHYDRATE, (MACROBID) 100 MG CAPSULE    Take 1 capsule (100 mg total) by mouth every 12 (twelve) hours.   WARFARIN (COUMADIN) 5 MG TABLET    Take 1 tablet (5 mg total) by mouth daily at 6 PM.   ZINC OXIDE EX    Apply 1 application topically once a week. Infused wrap for legs  Modified Medications   No medications on file  Discontinued Medications   No medications on file    Review of Systems  Constitutional: Positive for fatigue. Negative for fever, activity change, appetite change and unexpected weight change.  HENT: Negative for congestion, ear pain, hearing loss, rhinorrhea, sore throat, tinnitus, trouble swallowing and voice  change.   Eyes:       Corrective lenses  Respiratory: Negative for cough, choking, chest tightness, shortness of breath and wheezing.   Cardiovascular: Positive for leg swelling (Both legs are wrapped with Ace bandages). Negative for chest pain and palpitations.  Gastrointestinal: Negative for nausea, abdominal pain, diarrhea, constipation and abdominal distention.  Endocrine: Negative for cold intolerance, heat intolerance, polydipsia, polyphagia and polyuria.  Genitourinary: Positive for penile swelling and difficulty urinating. Negative for dysuria, urgency, frequency and testicular pain.       Foley catheter in place History of BPH with urinary outlet obstruction History of bladder stone.  Musculoskeletal: Negative for myalgias, back pain, arthralgias, gait problem and neck pain.  Skin: Negative for color change, pallor and rash.       Generally dry skin. Venous stasis changes of the lower legs bilaterally.  Allergic/Immunologic: Negative.   Neurological: Negative for dizziness, tremors, syncope, speech difficulty, weakness, numbness and headaches.       Mild dementia  Hematological: Negative for adenopathy. Does not bruise/bleed easily.       Mild anemia  Psychiatric/Behavioral: Positive for confusion. Negative for hallucinations, behavioral problems, sleep disturbance and decreased concentration. The patient is not nervous/anxious.     Filed Vitals:   01/04/15 1043  BP: 140/73  Pulse: 66  Temp: 97.6 F (36.4 C)  Resp: 18  Height: $Remove'6\' 2"'CraZvLj$  (1.88 m)  Weight: 322 lb (146.058 kg)   Body mass index is 41.32 kg/(m^2).  Physical Exam  Constitutional: He appears well-developed and well-nourished. No distress.  Obese  HENT:  Right Ear: External ear normal.  Left Ear: External ear normal.  Nose: Nose normal.  Mouth/Throat: Oropharynx is clear and moist. No oropharyngeal exudate.  Eyes: Conjunctivae and EOM are normal. Pupils are equal, round, and reactive to light.  Neck: No JVD  present. No tracheal deviation present. No thyromegaly present.  Cardiovascular: Normal rate and intact distal pulses.  Exam reveals no gallop and no friction rub.   No murmur heard. Atrial fibrillation  Pulmonary/Chest: No respiratory distress. He has no wheezes. He has no rales. He exhibits no tenderness.  Abdominal: He  exhibits no distension and no mass. There is no tenderness.  Genitourinary:  Foley catheter in place. Penile swelling.  Musculoskeletal: Normal range of motion. He exhibits edema (Both legs have Ace wrap.). He exhibits no tenderness.  Lymphadenopathy:    He has no cervical adenopathy.  Neurological: He is alert. He has normal reflexes. No cranial nerve deficit. Coordination normal.  Poor memory  Skin: Skin is dry. No rash noted. No erythema. No pallor.  Psychiatric: He has a normal mood and affect. His behavior is normal. Judgment and thought content normal.     Labs reviewed: Hospital Outpatient Visit on 12/31/2014  Component Date Value Ref Range Status  . Sodium 12/31/2014 141  135 - 145 mmol/L Final  . Potassium 12/31/2014 4.1  3.5 - 5.1 mmol/L Final  . Chloride 12/31/2014 109  101 - 111 mmol/L Final  . CO2 12/31/2014 28  22 - 32 mmol/L Final  . Glucose, Bld 12/31/2014 97  65 - 99 mg/dL Final  . BUN 12/31/2014 19  6 - 20 mg/dL Final  . Creatinine, Ser 12/31/2014 0.85  0.61 - 1.24 mg/dL Final  . Calcium 12/31/2014 8.6* 8.9 - 10.3 mg/dL Final  . GFR calc non Af Amer 12/31/2014 >60  >60 mL/min Final  . GFR calc Af Amer 12/31/2014 >60  >60 mL/min Final   Comment: (NOTE) The eGFR has been calculated using the CKD EPI equation. This calculation has not been validated in all clinical situations. eGFR's persistently <60 mL/min signify possible Chronic Kidney Disease.   . Anion gap 12/31/2014 4* 5 - 15 Final  . WBC 12/31/2014 7.9  4.0 - 10.5 K/uL Final  . RBC 12/31/2014 3.88* 4.22 - 5.81 MIL/uL Final  . Hemoglobin 12/31/2014 11.3* 13.0 - 17.0 g/dL Final  . HCT  12/31/2014 35.5* 39.0 - 52.0 % Final  . MCV 12/31/2014 91.5  78.0 - 100.0 fL Final  . MCH 12/31/2014 29.1  26.0 - 34.0 pg Final  . MCHC 12/31/2014 31.8  30.0 - 36.0 g/dL Final  . RDW 12/31/2014 15.7* 11.5 - 15.5 % Final  . Platelets 12/31/2014 268  150 - 400 K/uL Final  . Prothrombin Time 12/31/2014 24.2* 11.6 - 15.2 seconds Final  . INR 12/31/2014 2.20* 0.00 - 1.49 Final  . Prothrombin Time 01/03/2015 28.4* 11.6 - 15.2 seconds Final  . INR 01/03/2015 2.72* 0.00 - 1.49 Final  Hospital Outpatient Visit on 12/28/2014  Component Date Value Ref Range Status  . Prothrombin Time 12/28/2014 22.3* 11.6 - 15.2 seconds Final  . INR 12/28/2014 1.96* 0.00 - 1.49 Final  . WBC 12/28/2014 7.8  4.0 - 10.5 K/uL Final  . RBC 12/28/2014 4.10* 4.22 - 5.81 MIL/uL Final  . Hemoglobin 12/28/2014 11.9* 13.0 - 17.0 g/dL Final  . HCT 12/28/2014 37.6* 39.0 - 52.0 % Final  . MCV 12/28/2014 91.7  78.0 - 100.0 fL Final  . MCH 12/28/2014 29.0  26.0 - 34.0 pg Final  . MCHC 12/28/2014 31.6  30.0 - 36.0 g/dL Final  . RDW 12/28/2014 15.6* 11.5 - 15.5 % Final  . Platelets 12/28/2014 282  150 - 400 K/uL Final  . Sodium 12/28/2014 140  135 - 145 mmol/L Final  . Potassium 12/28/2014 4.1  3.5 - 5.1 mmol/L Final  . Chloride 12/28/2014 107  101 - 111 mmol/L Final  . CO2 12/28/2014 26  22 - 32 mmol/L Final  . Glucose, Bld 12/28/2014 140* 65 - 99 mg/dL Final  . BUN 12/28/2014 13  6 - 20 mg/dL  Final  . Creatinine, Ser 12/28/2014 0.92  0.61 - 1.24 mg/dL Final  . Calcium 12/28/2014 8.8* 8.9 - 10.3 mg/dL Final  . Total Protein 12/28/2014 6.5  6.5 - 8.1 g/dL Final  . Albumin 12/28/2014 3.1* 3.5 - 5.0 g/dL Final  . AST 12/28/2014 22  15 - 41 U/L Final  . ALT 12/28/2014 28  17 - 63 U/L Final  . Alkaline Phosphatase 12/28/2014 65  38 - 126 U/L Final  . Total Bilirubin 12/28/2014 0.5  0.3 - 1.2 mg/dL Final  . GFR calc non Af Amer 12/28/2014 >60  >60 mL/min Final  . GFR calc Af Amer 12/28/2014 >60  >60 mL/min Final   Comment:  (NOTE) The eGFR has been calculated using the CKD EPI equation. This calculation has not been validated in all clinical situations. eGFR's persistently <60 mL/min signify possible Chronic Kidney Disease.   . Anion gap 12/28/2014 7  5 - 15 Final  . Prothrombin Time 12/29/2014 23.9* 11.6 - 15.2 seconds Final  . INR 12/29/2014 2.15* 0.00 - 1.49 Final  Admission on 12/22/2014, Discharged on 12/26/2014  No results displayed because visit has over 200 results.    Anti-coag visit on 12/19/2014  Component Date Value Ref Range Status  . INR 12/19/2014 2.3   Final  Anti-coag visit on 12/12/2014  Component Date Value Ref Range Status  . INR 12/12/2014 2.1   Final  Anti-coag visit on 12/07/2014  Component Date Value Ref Range Status  . INR 12/07/2014 1.9   Final  Lab on 11/27/2014  Component Date Value Ref Range Status  . WBC, Ur, HPF, POC 11/27/2014 30-40   Final  . RBC, urine, microscopic 11/27/2014 5-10   Final  . Bacteria, U Microscopic 11/27/2014 mod   Final  . Mucus, UA 11/27/2014 neg   Final  . Epithelial cells, urine per micros 11/27/2014 occ   Final  . Crystals, Ur, HPF, POC 11/27/2014 neg   Final  . Casts, Ur, LPF, POC 11/27/2014 neg   Final  . Yeast, UA 11/27/2014 neg   Final  . Color, UA 11/27/2014 gold   Final  . Clarity, UA 11/27/2014 cloudy   Final  . Glucose, UA 11/27/2014 neg   Final  . Bilirubin, UA 11/27/2014 neg   Final  . Ketones, UA 11/27/2014 neg   Final  . Spec Grav, UA 11/27/2014 1.015   Final  . Blood, UA 11/27/2014 mod   Final  . pH, UA 11/27/2014 6.0   Final  . Protein, UA 11/27/2014 neg   Final  . Urobilinogen, UA 11/27/2014 negative   Final  . Nitrite, UA 11/27/2014 pos   Final  . Leukocytes, UA 11/27/2014 large (3+)* Negative Final  . Urine Culture, Routine 11/27/2014 Final report*  Final  . Result 1 11/27/2014 Staphylococcus aureus*  Final   Comment: Greater than 100,000 colony forming units per mL Methicillin resistant (MRSA) Based on resistance to  oxacillin this isolate would be resistant to all currently available beta-lactam antimicrobial agents, with the exception of the newer cephalosporins with anti-MRSA activity, such as Ceftaroline   . ANTIMICROBIAL SUSCEPTIBILITY 11/27/2014 Comment   Final   Comment:       ** S = Susceptible; I = Intermediate; R = Resistant **                    P = Positive; N = Negative             MICS are expressed in micrograms  per mL    Antibiotic                 RSLT#1    RSLT#2    RSLT#3    RSLT#4 Ciprofloxacin                  R Gentamicin                     S Levofloxacin                   R Linezolid                      S Nitrofurantoin                 S Oxacillin                      R Penicillin                     R Rifampin                       S Tetracycline                   S Trimethoprim/Sulfa             R Vancomycin                     S   Anti-coag visit on 11/21/2014  Component Date Value Ref Range Status  . INR 11/21/2014 2.0   Final  Anti-coag visit on 11/08/2014  Component Date Value Ref Range Status  . INR 11/08/2014 2.0   Final  Anti-coag visit on 11/02/2014  Component Date Value Ref Range Status  . INR 11/02/2014 2.2   Final  There may be more visits with results that are not included.  Ct Abdomen Pelvis Wo Contrast  12/22/2014   CLINICAL DATA:  Status post fall 2 days ago. Hip pain. Bed sore. Initial encounter.  EXAM: CT ABDOMEN AND PELVIS WITHOUT CONTRAST  TECHNIQUE: Multidetector CT imaging of the abdomen and pelvis was performed following the standard protocol without IV contrast.  COMPARISON:  CT abdomen and pelvis 03/19/2014.  FINDINGS: Dependent atelectasis is seen lung bases. There is no pleural or pericardial effusion. Heart size is normal.  Bilateral renal cysts are identified and unchanged. 2 right and 1 left nonobstructing renal stone are identified. There is no hydronephrosis and no ureteral stones are seen. The urinary bladder is nearly completely  decompressed with a Foley catheter in place. Stone within the urinary bladder measuring 2.6 cm in diameter is again seen as on the prior study. The prostate gland is enlarged.  The patient is status post cholecystectomy. The liver, spleen and adrenal glands appear normal. The pancreas is fatty replaced, unchanged.  Aortoiliac atherosclerosis is identified. 4.4 cm infrarenal abdominal aortic aneurysm is unchanged in appearance. No hemorrhage is identified. Small umbilical hernia is noted. The stomach, small and large bowel and appendix appear normal.  Healed right intertrochanteric fracture is seen with fixation hardware in place. No acute fracture is identified. Lumbar spondylosis appears most notable at L5-S1. No lytic or sclerotic bony lesion is seen.  IMPRESSION: No acute finding abdomen or pelvis.  Small bilateral nonobstructing renal stones.  Urinary bladder stone, unchanged compared the prior exam.  Prostatomegaly.  No change in a 4.4 cm infrarenal abdominal  aortic aneurysm. Recommend followup by ultrasound in 1 year. This recommendation follows ACR consensus guidelines: White Paper of the ACR Incidental Findings Committee II on Vascular Findings. J Am Coll Radiol 2013; 10:789-794.   Electronically Signed   By: Inge Rise M.D.   On: 12/22/2014 12:47   Dg Chest 2 View  12/22/2014   CLINICAL DATA:  Status post fall 2 days ago with left hip pain. Initial encounter.  EXAM: CHEST  2 VIEW  COMPARISON:  PA and lateral chest 10/14/2014.  FINDINGS: The lungs are clear. Heart size is normal. No pneumothorax or pleural effusion is identified. No focal bony abnormality is seen.  IMPRESSION: No acute disease.   Electronically Signed   By: Inge Rise M.D.   On: 12/22/2014 12:24   Ct Head Wo Contrast  12/22/2014   CLINICAL DATA:  Fall, trauma  EXAM: CT HEAD WITHOUT CONTRAST  TECHNIQUE: Contiguous axial images were obtained from the base of the skull through the vertex without intravenous contrast.   COMPARISON:  MRI 09/18/2013  FINDINGS: Mild cortical volume loss noted with disproportionate ventricular prominence which may suggest an element of hydrocephalus but is subjectively stable. Soft tissue material within the external auditory canals is compatible with cerumen. Mild ethmoid mucoperiosteal thickening. No osseous destruction. Areas of periventricular white matter hypodensity are most compatible with small vessel ischemic change. No acute hemorrhage, infarct, or mass lesion is identified. Encephalomalacia, left cerebellum, image 8.  IMPRESSION: No acute intracranial abnormality.  Proportional prominence of the ventricles as compared to the degree of cortical volume loss which may suggest an element of hydrocephalus but is stable.   Electronically Signed   By: Conchita Paris M.D.   On: 12/22/2014 12:41   Dg Hip Unilat With Pelvis 2-3 Views Left  12/22/2014   CLINICAL DATA:  Status post fall 2 days ago. Left hip pain. Initial encounter.  EXAM: DG HIP (WITH OR WITHOUT PELVIS) 2-3V LEFT  COMPARISON:  CT abdomen and pelvis 03/19/2014.  FINDINGS: No acute bony or joint abnormality is identified. The patient is status post fixation of a healed right intertrochanteric fracture. There is no notable degenerative change about the hips. Calcifications centrally in the pelvis correlates with the urinary bladder stone seen on the prior CT.  IMPRESSION: No acute abnormality.  Healed right intertrochanteric fracture.  Urinary bladder stone.   Electronically Signed   By: Inge Rise M.D.   On: 12/22/2014 12:26     Assessment/Plan  1. Pyelonephritis, acute Resolved. Now off antibiotics. He does have indwelling Foley catheter due to urinary outlet obstruction hospitalized. Urine is somewhat cloudy.  2. Dilated cardiomyopathy Stable and with preserved ejection fraction on echocardiogram May 2016  3. Anticoagulation goal of INR 2 to 3 Therapeutic goal achieved. Continue to monitor and adjust dose of  warfarin as necessary. Follow-up INR to be done 01/07/15.  4. Paroxysmal atrial fibrillation Currently at controlled rate  5. Benign localized hyperplasia of prostate with urinary retention Has resumed finasteride and doxazosin. He is to see Dr. Roni Bread in 2-3 weeks.  6. Bladder calculus Further management per Dr. Roni Bread.  7. Cognitive impairment Not currently medicated.  8. Chronic venous insufficiency Lives 2 bipedal edema. Leg wraps present on both legs at this time. Continue to monitor for potential for skin breakdown.  9. Multifactorial dementia Not currently medicated.  10. Weakness Enrolled in physical therapy for strengthening and gait training  11. AAA (abdominal aortic aneurysm) without rupture Stable

## 2015-01-07 LAB — PROTIME-INR
INR: 3.48 — ABNORMAL HIGH (ref 0.00–1.49)
Prothrombin Time: 34.2 seconds — ABNORMAL HIGH (ref 11.6–15.2)

## 2015-01-07 LAB — CBC WITH DIFFERENTIAL/PLATELET
BASOS ABS: 0.1 10*3/uL (ref 0.0–0.1)
Basophils Relative: 1 % (ref 0–1)
EOS ABS: 0.7 10*3/uL (ref 0.0–0.7)
Eosinophils Relative: 10 % — ABNORMAL HIGH (ref 0–5)
HEMATOCRIT: 37.5 % — AB (ref 39.0–52.0)
Hemoglobin: 11.8 g/dL — ABNORMAL LOW (ref 13.0–17.0)
LYMPHS ABS: 1.9 10*3/uL (ref 0.7–4.0)
Lymphocytes Relative: 26 % (ref 12–46)
MCH: 28.7 pg (ref 26.0–34.0)
MCHC: 31.5 g/dL (ref 30.0–36.0)
MCV: 91.2 fL (ref 78.0–100.0)
Monocytes Absolute: 0.8 10*3/uL (ref 0.1–1.0)
Monocytes Relative: 11 % (ref 3–12)
NEUTROS ABS: 3.8 10*3/uL (ref 1.7–7.7)
Neutrophils Relative %: 52 % (ref 43–77)
PLATELETS: 330 10*3/uL (ref 150–400)
RBC: 4.11 MIL/uL — ABNORMAL LOW (ref 4.22–5.81)
RDW: 15.8 % — AB (ref 11.5–15.5)
WBC: 7.3 10*3/uL (ref 4.0–10.5)

## 2015-01-07 LAB — BASIC METABOLIC PANEL
Anion gap: 4 — ABNORMAL LOW (ref 5–15)
BUN: 18 mg/dL (ref 6–20)
CO2: 29 mmol/L (ref 22–32)
Calcium: 8.7 mg/dL — ABNORMAL LOW (ref 8.9–10.3)
Chloride: 110 mmol/L (ref 101–111)
Creatinine, Ser: 0.94 mg/dL (ref 0.61–1.24)
GFR calc Af Amer: >60 mL/min (ref >60)
GLUCOSE: 99 mg/dL (ref 65–99)
POTASSIUM: 4.1 mmol/L (ref 3.5–5.1)
SODIUM: 143 mmol/L (ref 135–145)

## 2015-01-08 ENCOUNTER — Encounter (HOSPITAL_COMMUNITY)
Admission: RE | Admit: 2015-01-08 | Discharge: 2015-01-08 | Disposition: A | Discharge status: Home / Self Care | Payer: Medicare Other | Source: Skilled Nursing Facility | Attending: Internal Medicine | Admitting: Internal Medicine

## 2015-01-08 LAB — PROTIME-INR
INR: 3.24 — AB (ref 0.00–1.49)
Prothrombin Time: 32.5 seconds — ABNORMAL HIGH (ref 11.6–15.2)

## 2015-01-09 ENCOUNTER — Encounter (HOSPITAL_COMMUNITY)
Admission: AD | Admit: 2015-01-09 | Discharge: 2015-01-09 | Disposition: A | Discharge status: Home / Self Care | Payer: Medicare Other | Source: Skilled Nursing Facility | Attending: Internal Medicine | Admitting: Internal Medicine

## 2015-01-09 LAB — PROTIME-INR
INR: 2.95 — ABNORMAL HIGH (ref 0.00–1.49)
PROTHROMBIN TIME: 30.2 s — AB (ref 11.6–15.2)

## 2015-01-10 ENCOUNTER — Ambulatory Visit: Payer: Self-pay | Admitting: Family Medicine

## 2015-01-11 ENCOUNTER — Inpatient Hospital Stay
Admission: RE | Admit: 2015-01-11 | Discharge: 2015-02-10 | Payer: Medicare Other | Source: Ambulatory Visit | Attending: Family Medicine | Admitting: Family Medicine

## 2015-01-11 ENCOUNTER — Ambulatory Visit (HOSPITAL_COMMUNITY)
Admission: RE | Admit: 2015-01-11 | Discharge: 2015-01-11 | Disposition: A | Discharge status: Home / Self Care | Payer: Medicare Other | Source: Ambulatory Visit | Attending: Internal Medicine | Admitting: Internal Medicine

## 2015-01-11 ENCOUNTER — Non-Acute Institutional Stay (SKILLED_NURSING_FACILITY): Payer: Medicare Other | Admitting: Internal Medicine

## 2015-01-11 ENCOUNTER — Other Ambulatory Visit: Payer: Self-pay | Admitting: Internal Medicine

## 2015-01-11 ENCOUNTER — Encounter: Payer: Self-pay | Admitting: Internal Medicine

## 2015-01-11 ENCOUNTER — Emergency Department (HOSPITAL_COMMUNITY)
Admission: EM | Admit: 2015-01-11 | Discharge: 2015-01-11 | Discharge status: Absent without leave | Payer: Medicare Other

## 2015-01-11 DIAGNOSIS — H918X2 Other specified hearing loss, left ear: Secondary | ICD-10-CM | POA: Diagnosis not present

## 2015-01-11 DIAGNOSIS — R062 Wheezing: Secondary | ICD-10-CM

## 2015-01-11 DIAGNOSIS — I482 Chronic atrial fibrillation, unspecified: Secondary | ICD-10-CM

## 2015-01-11 DIAGNOSIS — J9811 Atelectasis: Secondary | ICD-10-CM | POA: Insufficient documentation

## 2015-01-11 DIAGNOSIS — I429 Cardiomyopathy, unspecified: Secondary | ICD-10-CM | POA: Diagnosis not present

## 2015-01-11 DIAGNOSIS — H6122 Impacted cerumen, left ear: Secondary | ICD-10-CM

## 2015-01-11 DIAGNOSIS — J42 Unspecified chronic bronchitis: Secondary | ICD-10-CM | POA: Diagnosis not present

## 2015-01-11 DIAGNOSIS — H906 Mixed conductive and sensorineural hearing loss, bilateral: Secondary | ICD-10-CM | POA: Insufficient documentation

## 2015-01-11 LAB — PROTIME-INR
INR: 2.79 — AB (ref 0.00–1.49)
Prothrombin Time: 29 seconds — ABNORMAL HIGH (ref 11.6–15.2)

## 2015-01-11 NOTE — Progress Notes (Signed)
Patient ID: MORTY ORTWEIN, male   DOB: 1931-10-23, 81 y.o.   MRN: [id number] Patient ID: RANEY KOEPPEN, male   DOB: 02/04/1932, 81 y.o.   MRN: [id number]    HISTORY AND PHYSICAL  Location:  [place] of Service: SNF (31)   Extended Emergency Contact Information Primary Emergency Contact: [name],[name] Address: [address]          [place], [place] [address] [name] of [place] Phone: [phone] Mobile Phone: [phone] Relation: Daughter Secondary Emergency Contact: [facility] Address: [address]          [place], [place] [address] [name] of [place] Phone: [phone] Mobile Phone: [phone] Relation: Son  Advanced Directive information    Chief Complaint  Patient presents with  . Acute Visit   acute visit secondary to complaints of ear impaction-anticoagulation management with history of atrial fibrillation DVT and pulmonary embolism-follow-up CHF  HPI:  Hospitalized 12/22/2014 through 12/26/2014 at [facility].. 81 year old male was admitted through the emergency room. Patient been treated for pyelonephritis 2515 16 and 10/17/2014 with IV antibiotics. This was followed by Levaquin by mouth. He had a recurrent UTI 11/27/2014 treated with Augmentin and then doxycycline. The last dose was given 12/14/2014. He developed urinary retention following running out of his doxazosin and finasteride. He saw Dr. Roni Bread. On presentation to the emergency room he had a temperature of 102.6 and a heart rate of 108 with atrial fibrillation. It was felt that he had pyelonephritis with sepsis again.  Patient is chronically anticoagulated for previous incidence of pulmonary embolus and DVT as well as his atrial fibrillation. He has a history of cardiomyopathy. There is venous insufficiency with chronic lower extremity edema for which he had been treated recently with Unna boots. There was previous history of spinal stenosis with decompressive laminectomy at L4-5.  He has COPD. There is a history of multiple falls. Multiple other chronic medical illnesses are outlined below.  Patient has a mild dementia.   .  Because of weakness brought on by this recent illness, he is admitted to the skilled nursing facility for rehab .  INR has been followed regularly since admission to have nursing center. He is now on 4 mg a day of warfarin with an INR of 2.79  He also has a history of cardiomyopathy but his weights actually have gone down he has significant venous stasis changes but this appears to be stable his weight is 316.8 his admit weight was 323.5.  He does not complain of any increased shortness of breath from baseline.  Patient's main complaint today is he feels his ears are plugged in with like them looked at.    w   Past Medical History  Diagnosis Date  . Venous insufficiency   . AAA (abdominal aortic aneurysm)   . Ulcer   . DVT (deep venous thrombosis) 05/04/2013    "LLE"  . Cellulitis   . Nicotine dependence   . Bronchospasm   . Elevated PSA   . BPH (benign prostatic hyperplasia)   . COPD (chronic obstructive pulmonary disease)   . Cognitive impairment   . Daily headache   . Arthritis     "probably on the right leg/hip" (09/20/2013)  . Dementia     "don't know exactly what kind" (09/20/2013)  . Kidney stones   . Glaucoma, both eyes   . Atrial fibrillation   . Fall at home September 18, 2013  . Benign localized hyperplasia of prostate  with urinary retention 12/23/2014  . AAA (abdominal aortic aneurysm) without rupture 10/06/2012  . Anticoagulation goal of INR 2 to 3 06/01/2013  . B12 deficiency 09/20/2013  . Bladder calculus 12/23/2014    2.6cm and present since at least 2011 when it was about 1.6cm.   . Cardiomyopathy 10/15/2014  . CHF (congestive heart failure) 09/26/2013  . Chronic venous insufficiency 10/06/2012  . Edema-Bilat Leg and foot 03/07/2013  . Multifactorial dementia 10/06/2012  . Obesity, unspecified 10/06/2012  . Peripheral  vascular disease 09/20/2012  . Pyelonephritis, acute 12/22/2014  . Weakness 09/18/2013    Past Surgical History  Procedure Laterality Date  . Cataract extraction w/ intraocular lens  implant, bilateral Bilateral   . Insitu percutaneous pinning femur Right 1986  . Hip fracture surgery Right 2009  . Back surgery    . Posterior laminectomy / decompression lumbar spine  2004    Decompressive laminectomy unilaterally on the right at L4-5 with  . Femur fracture surgery Right 04/1985  . Cystoscopy w/ stone manipulation  1970's    "once"  . Spine surgery    . Eye surgery    . Cholecystectomy  2011    [place] Bladder    Patient Care Team: [name], MD as PCP - General (Family Medicine) Irine Seal, MD as Attending Physician (Urology)  Social History   Social History  . Marital Status: Married    Spouse Name: N/A  . Number of Children: N/A  . Years of Education: N/A   Occupational History  . Not on file.   Social History Main Topics  . Smoking status: Former Smoker -- 2.00 packs/day for 54 years    Types: Cigarettes    Quit date: 08/24/2002  . Smokeless tobacco: Current User    Types: Snuff     Comment: 09/20/2013 "dips snuff"  . Alcohol Use: No  . Drug Use: No  . Sexual Activity: Not Currently   Other Topics Concern  . Not on file   Social History Narrative     reports that he quit smoking about 12 years ago. His smoking use included Cigarettes. He has a 108 pack-year smoking history. His smokeless tobacco use includes Snuff. He reports that he does not drink alcohol or use illicit drugs.  Family History  Problem Relation Age of Onset  . Aneurysm Brother   . Heart attack Brother     10-29-12  . Hypertension Brother   . Heart disease Brother     AAA  . Cancer Father     colon   Family Status  Relation Status Death Age  . Brother Deceased   . Father Deceased 57  . Mother Deceased     Facilities manager  . Brother Deceased     Immunization History    Administered Date(s) Administered  . Influenza,inj,Quad PF,36+ Mos 03/20/2013, 03/23/2014  . Pneumococcal Polysaccharide-23 03/02/2007  . Td 12/30/1996    Allergies  Allergen Reactions  . Oxycodone Hcl Other (See Comments)     makes pt "wild"  . Propoxyphene N-Acetaminophen Other (See Comments)     Makes pt. "wild"    Medications: Patient's Medications  New Prescriptions   No medications on file  Previous Medications   ACETAMINOPHEN (TYLENOL) 500 MG TABLET    Take 1,000 mg by mouth every 6 (six) hours as needed (pain).   DILTIAZEM (CARDIZEM CD) 120 MG 24 HR CAPSULE    Take 120 mg by mouth daily.    DOXAZOSIN (CARDURA) 8 MG TABLET  Take 8 mg by mouth daily.   FINASTERIDE (PROSCAR) 5 MG TABLET    Take 5 mg by mouth at bedtime.    FLUCONAZOLE (DIFLUCAN) 100 MG TABLET    Take 1 tablet (100 mg total) by mouth daily.   LATANOPROST (XALATAN) 0.005 % OPHTHALMIC SOLUTION    Place 1 drop into both eyes at bedtime.    METOPROLOL TARTRATE (LOPRESSOR) 25 MG TABLET    Take 0.5 tablets (12.5 mg total) by mouth 2 (two) times daily.   NITROFURANTOIN, MACROCRYSTAL-MONOHYDRATE, (MACROBID) 100 MG CAPSULE    Take 1 capsule (100 mg total) by mouth every 12 (twelve) hours.   WARFARIN (COUMADIN) 5 MG TABLET    Take 1 tablet (5 mg total) by mouth daily at 6 PM.   ZINC OXIDE EX    Apply 1 application topically once a week. Infused wrap for legs  Modified Medications   No medications on file  Discontinued Medications   No medications on file   of note Coumadin dose is now 4 mg a day   Review of Systems  Constitutional: Positive for fatigue. Negative for fever, activity change, appetite change and unexpected weight change.  HENT: Negative for congestion, ear pain, , rhinorrhea, sore throat, tinnitus, trouble swallowing and voice change. Does complain that his left ear feels plugged  Eyes:       Corrective lenses  Respiratory: Negative for cough, choking, chest tightness, shortness of breath and  wheezing.   Cardiovascular: Positive for leg swelling (Both legs are wrapped with Ace bandages). Negative for chest pain and palpitations.  Gastrointestinal: Negative for nausea, abdominal pain, diarrhea, constipation and abdominal distention.  Endocrine: Negative for cold intolerance, heat intolerance, polydipsia, polyphagia and polyuria.  Genitourinary: Positive for penile swelling and difficulty urinating. Negative for dysuria, urgency, frequency and testicular pain.       Foley catheter in place History of BPH with urinary outlet obstruction History of bladder stone.  Musculoskeletal: Negative for myalgias, back pain, arthralgias, gait problem and neck pain.  Skin: Negative for color change, pallor and rash.       Generally dry skin. Venous stasis changes of the lower legs bilaterally.  Allergic/Immunologic: Negative.   Neurological: Negative for dizziness, tremors, syncope, speech difficulty, weakness, numbness and headaches.       Mild dementia  Hematological: Negative for adenopathy. Does not bruise/bleed easily.       Mild anemia  Psychiatric/Behavioral: Positive for confusion. Negative for hallucinations, behavioral problems, sleep disturbance and decreased concentration. The patient is not nervous/anxious.     Filed Vitals:   01/11/15 1628  BP: 151/61  Pulse: 80  Resp: 99   his weight is 316.8   Physical Exam  Constitutional: He appears well-developed and well-nourished. No distress.  Obese  HENT:  Right Ear: External ear normal.  there is a small amount of wax in the ear canal  Left Ear: External ear normal.  there is some wax noted in the ear canal this is more so than in the right ear tympanic membrane  difficult to visualize  Nose: Nose normal.  Mouth/Throat: Oropharynx is clear and moist. No oropharyngeal exudate.  Eyes: Conjunctivae and EOM are normal. Pupils are equal, round, and reactive to light.  Neck: No JVD present. No tracheal deviation present. No  thyromegaly present.  Cardiovascular: Normal rate and intact distal pulses.  Exam reveals no gallop and no friction rub.   No murmur heard. At  Pulmonary/Chest: No respiratory distress. He does have diffuse respiratory wheezing .  He has no rales. He exhibits no tenderness.  Abdominal: He exhibits no distension and no mass. There is no tenderness.  Genitourinary:  Foley catheter in place. Penile swelling.  Musculoskeletal: Normal range of motion. He exhibits edema (Both legs have Ace wrap.). He exhibits no tenderness.  Lymphadenopathy:    He has no cervical adenopathy.  Neurological: He is alert. He has normal reflexes. No cranial nerve deficit. Coordination normal.  Poor memory  Skin: Skin is dry. No rash noted. No erythema. No pallor.  Psychiatric: He has a normal mood and affect. His behavior is normal. Judgment and thought content normal.     labs.  01/11/2015.  INR 2.79.  01/07/2015.  WBC 7.3 hemoglobin 11.8 platelets 3:30.  Sodium 143 potassium 4.1 BUN 18 creatinine 0.94  Hospital Outpatient Visit on 01/09/2015  Component Date Value Ref Range Status  . Prothrombin Time 01/09/2015 30.2* 11.6 - 15.2 seconds Final  . INR 01/09/2015 2.95* 0.00 - 1.49 Final  Hospital Outpatient Visit on 01/08/2015  Component Date Value Ref Range Status  . Prothrombin Time 01/08/2015 32.5* 11.6 - 15.2 seconds Final  . INR 01/08/2015 3.24* 0.00 - 1.49 Final  Hospital Outpatient Visit on 12/31/2014  Component Date Value Ref Range Status  . Sodium 12/31/2014 141  135 - 145 mmol/L Final  . Potassium 12/31/2014 4.1  3.5 - 5.1 mmol/L Final  . Chloride 12/31/2014 109  101 - 111 mmol/L Final  . CO2 12/31/2014 28  22 - 32 mmol/L Final  . Glucose, Bld 12/31/2014 97  65 - 99 mg/dL Final  . BUN 12/31/2014 19  6 - 20 mg/dL Final  . Creatinine, Ser 12/31/2014 0.85  0.61 - 1.24 mg/dL Final  . Calcium 12/31/2014 8.6* 8.9 - 10.3 mg/dL Final  . GFR calc non Af Amer 12/31/2014 >60  >60 mL/min Final  . GFR  calc Af Amer 12/31/2014 >60  >60 mL/min Final   Comment: (NOTE) The eGFR has been calculated using the CKD EPI equation. This calculation has not been validated in all clinical situations. eGFR's persistently <60 mL/min signify possible Chronic Kidney Disease.   . Anion gap 12/31/2014 4* 5 - 15 Final  . WBC 12/31/2014 7.9  4.0 - 10.5 K/uL Final  . RBC 12/31/2014 3.88* 4.22 - 5.81 MIL/uL Final  . Hemoglobin 12/31/2014 11.3* 13.0 - 17.0 g/dL Final  . HCT 12/31/2014 35.5* 39.0 - 52.0 % Final  . MCV 12/31/2014 91.5  78.0 - 100.0 fL Final  . MCH 12/31/2014 29.1  26.0 - 34.0 pg Final  . MCHC 12/31/2014 31.8  30.0 - 36.0 g/dL Final  . RDW 12/31/2014 15.7* 11.5 - 15.5 % Final  . Platelets 12/31/2014 268  150 - 400 K/uL Final  . Prothrombin Time 12/31/2014 24.2* 11.6 - 15.2 seconds Final  . INR 12/31/2014 2.20* 0.00 - 1.49 Final  . Prothrombin Time 01/03/2015 28.4* 11.6 - 15.2 seconds Final  . INR 01/03/2015 2.72* 0.00 - 1.49 Final  . Prothrombin Time 01/07/2015 34.2* 11.6 - 15.2 seconds Final  . INR 01/07/2015 3.48* 0.00 - 1.49 Final  . WBC 01/07/2015 7.3  4.0 - 10.5 K/uL Final  . RBC 01/07/2015 4.11* 4.22 - 5.81 MIL/uL Final  . Hemoglobin 01/07/2015 11.8* 13.0 - 17.0 g/dL Final  . HCT 01/07/2015 37.5* 39.0 - 52.0 % Final  . MCV 01/07/2015 91.2  78.0 - 100.0 fL Final  . MCH 01/07/2015 28.7  26.0 - 34.0 pg Final  . MCHC 01/07/2015 31.5  30.0 - 36.0 g/dL  Final  . RDW 01/07/2015 15.8* 11.5 - 15.5 % Final  . Platelets 01/07/2015 330  150 - 400 K/uL Final  . Neutrophils Relative % 01/07/2015 52  43 - 77 % Final  . Neutro Abs 01/07/2015 3.8  1.7 - 7.7 K/uL Final  . Lymphocytes Relative 01/07/2015 26  12 - 46 % Final  . Lymphs Abs 01/07/2015 1.9  0.7 - 4.0 K/uL Final  . Monocytes Relative 01/07/2015 11  3 - 12 % Final  . Monocytes Absolute 01/07/2015 0.8  0.1 - 1.0 K/uL Final  . Eosinophils Relative 01/07/2015 10* 0 - 5 % Final  . Eosinophils Absolute 01/07/2015 0.7  0.0 - 0.7 K/uL Final  .  Basophils Relative 01/07/2015 1  0 - 1 % Final  . Basophils Absolute 01/07/2015 0.1  0.0 - 0.1 K/uL Final  . Sodium 01/07/2015 143  135 - 145 mmol/L Final  . Potassium 01/07/2015 4.1  3.5 - 5.1 mmol/L Final  . Chloride 01/07/2015 110  101 - 111 mmol/L Final  . CO2 01/07/2015 29  22 - 32 mmol/L Final  . Glucose, Bld 01/07/2015 99  65 - 99 mg/dL Final  . BUN 01/07/2015 18  6 - 20 mg/dL Final  . Creatinine, Ser 01/07/2015 0.94  0.61 - 1.24 mg/dL Final  . Calcium 01/07/2015 8.7* 8.9 - 10.3 mg/dL Final  . GFR calc non Af Amer 01/07/2015 >60  >60 mL/min Final  . GFR calc Af Amer 01/07/2015 >60  >60 mL/min Final   Comment: (NOTE) The eGFR has been calculated using the CKD EPI equation. This calculation has not been validated in all clinical situations. eGFR's persistently <60 mL/min signify possible Chronic Kidney Disease.   . Anion gap 01/07/2015 4* 5 - 15 Final  . Prothrombin Time 01/11/2015 29.0* 11.6 - 15.2 seconds Final  . INR 01/11/2015 2.79* 0.00 - 1.49 Final  . Prothrombin Time 01/17/2015 28.8* 11.6 - 15.2 seconds Final  . INR 01/17/2015 2.77* 0.00 - 1.49 Final  Hospital Outpatient Visit on 12/28/2014  Component Date Value Ref Range Status  . Prothrombin Time 12/28/2014 22.3* 11.6 - 15.2 seconds Final  . INR 12/28/2014 1.96* 0.00 - 1.49 Final  . WBC 12/28/2014 7.8  4.0 - 10.5 K/uL Final  . RBC 12/28/2014 4.10* 4.22 - 5.81 MIL/uL Final  . Hemoglobin 12/28/2014 11.9* 13.0 - 17.0 g/dL Final  . HCT 12/28/2014 37.6* 39.0 - 52.0 % Final  . MCV 12/28/2014 91.7  78.0 - 100.0 fL Final  . MCH 12/28/2014 29.0  26.0 - 34.0 pg Final  . MCHC 12/28/2014 31.6  30.0 - 36.0 g/dL Final  . RDW 12/28/2014 15.6* 11.5 - 15.5 % Final  . Platelets 12/28/2014 282  150 - 400 K/uL Final  . Sodium 12/28/2014 140  135 - 145 mmol/L Final  . Potassium 12/28/2014 4.1  3.5 - 5.1 mmol/L Final  . Chloride 12/28/2014 107  101 - 111 mmol/L Final  . CO2 12/28/2014 26  22 - 32 mmol/L Final  . Glucose, Bld  12/28/2014 140* 65 - 99 mg/dL Final  . BUN 12/28/2014 13  6 - 20 mg/dL Final  . Creatinine, Ser 12/28/2014 0.92  0.61 - 1.24 mg/dL Final  . Calcium 12/28/2014 8.8* 8.9 - 10.3 mg/dL Final  . Total Protein 12/28/2014 6.5  6.5 - 8.1 g/dL Final  . Albumin 12/28/2014 3.1* 3.5 - 5.0 g/dL Final  . AST 12/28/2014 22  15 - 41 U/L Final  . ALT 12/28/2014 28  17 - 63  U/L Final  . Alkaline Phosphatase 12/28/2014 65  38 - 126 U/L Final  . Total Bilirubin 12/28/2014 0.5  0.3 - 1.2 mg/dL Final  . GFR calc non Af Amer 12/28/2014 >60  >60 mL/min Final  . GFR calc Af Amer 12/28/2014 >60  >60 mL/min Final   Comment: (NOTE) The eGFR has been calculated using the CKD EPI equation. This calculation has not been validated in all clinical situations. eGFR's persistently <60 mL/min signify possible Chronic Kidney Disease.   . Anion gap 12/28/2014 7  5 - 15 Final  . Prothrombin Time 12/29/2014 23.9* 11.6 - 15.2 seconds Final  . INR 12/29/2014 2.15* 0.00 - 1.49 Final  Admission on 12/22/2014, Discharged on 12/26/2014  No results displayed because visit has over 200 results.    Anti-coag visit on 12/19/2014  Component Date Value Ref Range Status  . INR 12/19/2014 2.3   Final  Anti-coag visit on 12/12/2014  Component Date Value Ref Range Status  . INR 12/12/2014 2.1   Final  Anti-coag visit on 12/07/2014  Component Date Value Ref Range Status  . INR 12/07/2014 1.9   Final  Lab on 11/27/2014  Component Date Value Ref Range Status  . WBC, Ur, HPF, POC 11/27/2014 30-40   Final  . RBC, urine, microscopic 11/27/2014 5-10   Final  . Bacteria, U Microscopic 11/27/2014 mod   Final  . Mucus, UA 11/27/2014 neg   Final  . Epithelial cells, urine per micros 11/27/2014 occ   Final  . Crystals, Ur, HPF, POC 11/27/2014 neg   Final  . Casts, Ur, LPF, POC 11/27/2014 neg   Final  . Yeast, UA 11/27/2014 neg   Final  . Color, UA 11/27/2014 gold   Final  . Clarity, UA 11/27/2014 cloudy   Final  . Glucose, UA 11/27/2014  neg   Final  . Bilirubin, UA 11/27/2014 neg   Final  . Ketones, UA 11/27/2014 neg   Final  . Spec Grav, UA 11/27/2014 1.015   Final  . Blood, UA 11/27/2014 mod   Final  . pH, UA 11/27/2014 6.0   Final  . Protein, UA 11/27/2014 neg   Final  . Urobilinogen, UA 11/27/2014 negative   Final  . Nitrite, UA 11/27/2014 pos   Final  . Leukocytes, UA 11/27/2014 large (3+)* Negative Final  . Urine Culture, Routine 11/27/2014 Final report*  Final  . Urine Culture result 1 11/27/2014 Staphylococcus aureus*  Final   Comment: Greater than 100,000 colony forming units per mL Methicillin resistant (MRSA) Based on resistance to oxacillin this isolate would be resistant to all currently available beta-lactam antimicrobial agents, with the exception of the newer cephalosporins with anti-MRSA activity, such as Ceftaroline   . ANTIMICROBIAL SUSCEPTIBILITY 11/27/2014 Comment   Final   Comment:       ** S = Susceptible; I = Intermediate; R = Resistant **                    P = Positive; N = Negative             MICS are expressed in micrograms per mL    Antibiotic                 RSLT#1    RSLT#2    RSLT#3    RSLT#4 Ciprofloxacin                  R Gentamicin  S Levofloxacin                   R Linezolid                      S Nitrofurantoin                 S Oxacillin                      R Penicillin                     R Rifampin                       S Tetracycline                   S Trimethoprim/Sulfa             R Vancomycin                     S   Anti-coag visit on 11/21/2014  Component Date Value Ref Range Status  . INR 11/21/2014 2.0   Final  There may be more visits with results that are not included.  Dg Chest 2 View  01/11/2015   CLINICAL DATA:  Increase wheezing  EXAM: CHEST - 2 VIEW  COMPARISON:  12/22/2014  FINDINGS: Cardiac shadow is stable. The lungs are well aerated bilaterally. Mild atelectasis is noted in the right lung base projecting in the right middle  and lower lobes on the lateral projection. No focal confluent infiltrate is seen.  IMPRESSION: Right basilar atelectasis.   Electronically Signed   By: Inez Catalina M.D.   On: 01/11/2015 22:02     Assessment/Plan  1. Pyelonephritis, acute Resolved. Now off antibiotics. He does have indwelling Foley catheter due to urinary outlet obstruction hospitalized. Urine is somewhat cloudy.  2. Dilated cardiomyopathy Stable and with preserved ejection fraction on echocardiogram May 2016--he actually has lost some weight possible edema-.  I do hear some wheezing on exam with a history COPD Will obtain a chest x-ray to rule out any acute pulmonary etiology   3. Anticoagulation goal of INR 2 to 3 Therapeutic goal achieved. Continue to monitor and adjust dose of warfarin as necessary. Follow-up INR to be done on Monday, August 15.  4. Paroxysmal atrial fibrillation Currently at controlled rate  5. Benign localized hyperplasia of prostate with urinary retention Has resumed finasteride and doxazosin. He is to see Dr. Roni Bread in 2- weeks.  6. Bladder calculus Further management per Dr. Roni Bread.  7. Cognitive impairment Not currently medicated.  8. Chronic venous insufficiency  2 bipedal edema. Leg wraps present on both legs at this time. Continue to monitor for potential for skin breakdown.  9. Multifactorial dementia Not currently medicated.  10. Weakness Enrolled in physical therapy for strengthening and gait training  11. AAA (abdominal aortic aneurysm) without rupture  History COPD-I do note some wheezing this has not been the case on EXAM so will order a chest x-ray to rule out any pulmonary etiology-also add duo nebs when necessary every 6 hours.  #12 history of wax impaction-will order de box to both ears for 3 days and flush with warm water on day 4 monitor for resolution.   #12 history of anemia this has been stable with most recent hemoglobin 11.8 on  01/07/2015.  [id number] Stable

## 2015-01-14 ENCOUNTER — Encounter (HOSPITAL_COMMUNITY)
Admission: RE | Admit: 2015-01-14 | Discharge: 2015-01-14 | Disposition: A | Discharge status: Home / Self Care | Payer: Medicare Other | Source: Skilled Nursing Facility | Attending: Internal Medicine | Admitting: Internal Medicine

## 2015-01-14 LAB — BASIC METABOLIC PANEL
ANION GAP: 6 (ref 5–15)
BUN: 17 mg/dL (ref 6–20)
CALCIUM: 8.8 mg/dL — AB (ref 8.9–10.3)
CO2: 29 mmol/L (ref 22–32)
Chloride: 108 mmol/L (ref 101–111)
Creatinine, Ser: 0.86 mg/dL (ref 0.61–1.24)
Glucose, Bld: 95 mg/dL (ref 65–99)
Potassium: 4.3 mmol/L (ref 3.5–5.1)
Sodium: 143 mmol/L (ref 135–145)

## 2015-01-14 LAB — CBC
HCT: 35.7 % — ABNORMAL LOW (ref 39.0–52.0)
Hemoglobin: 11.2 g/dL — ABNORMAL LOW (ref 13.0–17.0)
MCH: 28.6 pg (ref 26.0–34.0)
MCHC: 31.4 g/dL (ref 30.0–36.0)
MCV: 91.3 fL (ref 78.0–100.0)
Platelets: 288 10*3/uL (ref 150–400)
RBC: 3.91 MIL/uL — AB (ref 4.22–5.81)
RDW: 15.9 % — ABNORMAL HIGH (ref 11.5–15.5)
WBC: 7.7 10*3/uL (ref 4.0–10.5)

## 2015-01-14 LAB — PROTIME-INR
INR: 2.77 — AB (ref 0.00–1.49)
PROTHROMBIN TIME: 28.8 s — AB (ref 11.6–15.2)

## 2015-01-17 LAB — PROTIME-INR
INR: 2.77 — AB (ref 0.00–1.49)
Prothrombin Time: 28.8 seconds — ABNORMAL HIGH (ref 11.6–15.2)

## 2015-01-18 ENCOUNTER — Ambulatory Visit (INDEPENDENT_AMBULATORY_CARE_PROVIDER_SITE_OTHER): Payer: Medicare Other | Admitting: Urology

## 2015-01-18 DIAGNOSIS — N210 Calculus in bladder: Secondary | ICD-10-CM

## 2015-01-18 DIAGNOSIS — N401 Enlarged prostate with lower urinary tract symptoms: Secondary | ICD-10-CM

## 2015-01-18 DIAGNOSIS — R339 Retention of urine, unspecified: Secondary | ICD-10-CM | POA: Diagnosis not present

## 2015-01-21 ENCOUNTER — Encounter (HOSPITAL_COMMUNITY)
Admission: RE | Admit: 2015-01-21 | Discharge: 2015-01-21 | Disposition: A | Discharge status: Home / Self Care | Payer: Medicare Other | Source: Skilled Nursing Facility | Attending: Internal Medicine | Admitting: Internal Medicine

## 2015-01-21 ENCOUNTER — Non-Acute Institutional Stay (SKILLED_NURSING_FACILITY): Payer: Medicare Other | Admitting: Internal Medicine

## 2015-01-21 DIAGNOSIS — I482 Chronic atrial fibrillation, unspecified: Secondary | ICD-10-CM

## 2015-01-21 DIAGNOSIS — I872 Venous insufficiency (chronic) (peripheral): Secondary | ICD-10-CM

## 2015-01-21 DIAGNOSIS — I509 Heart failure, unspecified: Secondary | ICD-10-CM | POA: Diagnosis not present

## 2015-01-21 DIAGNOSIS — J449 Chronic obstructive pulmonary disease, unspecified: Secondary | ICD-10-CM

## 2015-01-21 LAB — PROTIME-INR
INR: 2.98 — ABNORMAL HIGH (ref 0.00–1.49)
Prothrombin Time: 30.4 seconds — ABNORMAL HIGH (ref 11.6–15.2)

## 2015-01-26 NOTE — Progress Notes (Addendum)
Patient ID: [name], male   DOB: 02/14/1932, 81 y.o.   MRN: [id number]                PROGRESS NOTE  DATE:  01/21/2015                FACILITY: [facility]                     LEVEL OF CARE:   SNF   Acute Visit    CHIEF COMPLAINT:  Review of medical issues, follow up COPD.      HISTORY OF PRESENT ILLNESS:  This is a patient whom I know from previous stays in the building, lastly in April 2016.    He has mild cognitive loss, COPD, and multifactorial immobility.  Usually when he comes here, he can be made to mobilize with a walker for maybe 15 feet.  However, then he goes home and generally loses this ability due to immobility at home.    He also has very significant venous insufficiency and states his physiology was ulceration, atrial fibrillation on chronic anticoagulation.    He had an index hospitalization from 12/22/2014 through 12/26/2014, admitted to the building in early August by our facility.  This was largely secondary to a UTI, urinary retention secondary to BPH, and a large bladder stone.  He was given a Foley catheter in the emergency department.  His culture grew Enterococcus and he has completed antibiotics.    He also has diastolic heart failure and a history of a DVT on Coumadin.    He has been rehabilitating here.  I am not sure of his ambulatory status currently.      CURRENT MEDICATIONS:  Medication list is reviewed.         Coumadin 4 mg.    Finasteride 5 daily.    Latanoprost ophthalmic.    Metoprolol 25 b.i.d.       Diltiazem CD 120 q.d.     Doxazosin 8 mg daily.      Zoloft 50 q.d.    *He has a chronic Foley catheter at the moment.      LABORATORY DATA:   Lab work from today shows an INR of 2.98.    REVIEW OF SYSTEMS:    GENERAL: No weight loss or fever CHEST/RESPIRATORY:  He does not complain of shortness of breath.   CARDIAC:  No complaints of chest pain or palpitations.   GI:  No nausea, vomiting, or abdominal pain.           GU:  No suprapubic or flank pain.   MUSCULOSKELETAL:  Lower extremities:  He is concerned about his lower extremity wraps.    PHYSICAL EXAMINATION:   VITAL SIGNS:     PULSE:  73, AFib.   RESPIRATIONS:  18.     02 SATURATIONS:  95% on room air.   GENERAL APPEARANCE:  The patient is not in any distress at all on room air.        CHEST/RESPIRATORY:  Shallow, but otherwise clear air entry bilaterally.    CARDIOVASCULAR:   CARDIAC:  Heart sounds are irregular.  There are no murmurs.   No elevated jugular venous pressure.  GASTROINTESTINAL:   ABDOMEN:  Soft.    LIVER/SPLEEN/KIDNEYS:  No liver, no spleen.  No tenderness.    GENITOURINARY:   BLADDER:  No suprapubic or costovertebral angle tenderness.   MUSCULOSKELETAL:   EXTREMITIES:  I will remove the wraps on  his legs and check the resultant ulceration, which has been fairly chronic.  Severe OA of the knees MOBILITY:  He has multifactorial immobility, as noted.  I will check on the status here.    ASSESSMENT/PLAN:                COPD.  This is stable.    Persistent atrial fibrillation.  Heart rate is controlled.    History of DVT.  On Coumadin.  His INR today is 2.98.  I will continue his Coumadin and recheck his INR later in the week.    UTI.  He should have completed his antibiotics by now.    Diastolic heart failure.  I see no evidence of this.    Severe venous insufficiency, inflammation with superficial ulcerations.  This is actually better than what I have seen this in the past.   The small ulcers he has bilaterally on the anterior legs are very superficial.  I think we can dress these with silver alginate, Kerlix and Coban.  TCA to the chronic erythema might help in the short term.     CPT CODE: 16109

## 2015-01-28 ENCOUNTER — Encounter (HOSPITAL_COMMUNITY)
Admission: RE | Admit: 2015-01-28 | Discharge: 2015-01-28 | Disposition: A | Discharge status: Home / Self Care | Payer: Medicare Other | Source: Skilled Nursing Facility | Attending: Internal Medicine | Admitting: Internal Medicine

## 2015-01-28 ENCOUNTER — Encounter (HOSPITAL_COMMUNITY)
Admission: AD | Admit: 2015-01-28 | Discharge: 2015-01-28 | Disposition: A | Discharge status: Home / Self Care | Payer: Medicare Other | Source: Skilled Nursing Facility | Attending: Internal Medicine | Admitting: Internal Medicine

## 2015-01-28 LAB — PROTIME-INR
INR: 2.73 — ABNORMAL HIGH (ref 0.00–1.49)
Prothrombin Time: 28.5 seconds — ABNORMAL HIGH (ref 11.6–15.2)

## 2015-01-30 ENCOUNTER — Non-Acute Institutional Stay (SKILLED_NURSING_FACILITY): Payer: Medicare Other | Admitting: Internal Medicine

## 2015-01-30 DIAGNOSIS — J449 Chronic obstructive pulmonary disease, unspecified: Secondary | ICD-10-CM | POA: Diagnosis not present

## 2015-01-30 DIAGNOSIS — I482 Chronic atrial fibrillation, unspecified: Secondary | ICD-10-CM

## 2015-01-30 DIAGNOSIS — I509 Heart failure, unspecified: Secondary | ICD-10-CM | POA: Diagnosis not present

## 2015-01-30 DIAGNOSIS — Z7901 Long term (current) use of anticoagulants: Secondary | ICD-10-CM

## 2015-02-05 NOTE — Progress Notes (Addendum)
Patient ID: [name], male   DOB: 1931-08-14, 81 y.o.   MRN: [id number]                PROGRESS NOTE  DATE:  01/30/2015         FACILITY: [facility]          LEVEL OF CARE:   SNF   Acute Visit/Discharge Visit   CHIEF COMPLAINT:  Pre-discharge review/review of medical issues.  ???  HISTORY OF PRESENT ILLNESS:  [name] is a gentleman whom I know well from his days at [facility] as well as previous stays in this building.    He has a combination of:    Mild cognitive loss.    COPD.    Multifactorial immobility mostly related to severe osteoarthritis.    Severe venous stasis with a history of ulceration.    Chronic atrial fibrillation and anticoagulation.    He had an index hospitalization from 12/22/2014 through 12/26/2014, largely secondary to a UTI, urinary retention secondary to BPH, and a large bladder stone.  He was catheterized in the emergency department for urinary retention.  He has followed up with Urology and they gave him a voiding trial.  He was not able to void.    Finally, he has a history of diastolic heart failure and a history of AFib and DVT, on chronic Coumadin.    I have reviewed him today.  He apparently has not done well in physical therapy.  In fact, he is now back to a [place] lift transfer.  Previously, we were able to get him to walk maybe 5-10 feet with maximal assist of a walker.  He has not graduated to this, this time.    CURRENT MEDICATIONS:  Medication list is reviewed.          Finasteride 5 q.d.      Latanoprost ophthalmic.    Metoprolol 25,  tablet t.i.d.   Diltiazem CD 120 daily.    Doxazosin 8 daily.    Coumadin 4 mg daily.   Last INR was 2.73 on 01/28/2015.      Zoloft 50 q.d.      REVIEW OF SYSTEMS:    GENERAL:  He apparently has had some weight loss, although this is not necessarily a bad thing.   CHEST/RESPIRATORY:  No shortness of breath.   CARDIAC:  No chest pain.   ABD: no abdominal  pain or diarrea GU: no dysuria EDEMA/VARICOSITIES:  Extremities:  Venous stasis.   SKIN:  He has a small area on the left lateral ankle that is still open.   PSYCHIATRIC:  Mental status:   No depressive symptoms.    PHYSICAL EXAMINATION:   GENERAL APPEARANCE:  The patient is not in any distress.      CHEST/RESPIRATORY:  Shallow, but otherwise clear air entry.    CARDIOVASCULAR:   CARDIAC:  Heart sounds are regular.  There are no murmurs.  JVP is not elevated.  He has no signs of congestive heart failure.     NEUROLOGICAL:    BALANCE/GAIT:  The patient can barely bring himself to a standing position.  I think he is developing contractures at the hips.  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    SKIN: small open area on the left ankle. Severe venous stasis with inflammation    ASSESSMENT/PLAN:  COPD.  This is stable.    Persistent atrial fibrillation.   Rate controlled on Coumadin.    Urinary retention.  He apparently failed a voiding trial at Urology.  I am really not going to consider removing this.  He would either need in-and-out catheterizations or, more likely, a chronic indwelling Foley catheter.    Diastolic heart failure.  I see no evidence of this.    Multifactorial immobility necessitating use of a [place] lift for transferring and a wheelchair.  There are many assisted livings that will not do [place] lift transfers.  I talked to his daughter about this today.    Severe venous insufficiency.  He will need longstanding Kerlix/Coban.

## 2015-02-10 ENCOUNTER — Emergency Department (HOSPITAL_COMMUNITY): Payer: Medicare Other

## 2015-02-10 ENCOUNTER — Encounter (HOSPITAL_COMMUNITY): Payer: Self-pay | Admitting: Emergency Medicine

## 2015-02-10 ENCOUNTER — Inpatient Hospital Stay (HOSPITAL_COMMUNITY)
Admission: EM | Admit: 2015-02-10 | Discharge: 2015-02-13 | DRG: 694 | Disposition: A | Discharge status: Skilled Nursing Facility | Payer: Medicare Other | Attending: Internal Medicine | Admitting: Internal Medicine

## 2015-02-10 ENCOUNTER — Non-Acute Institutional Stay (SKILLED_NURSING_FACILITY): Payer: Medicare Other | Admitting: Internal Medicine

## 2015-02-10 DIAGNOSIS — I509 Heart failure, unspecified: Secondary | ICD-10-CM

## 2015-02-10 DIAGNOSIS — R112 Nausea with vomiting, unspecified: Secondary | ICD-10-CM | POA: Diagnosis not present

## 2015-02-10 DIAGNOSIS — I872 Venous insufficiency (chronic) (peripheral): Secondary | ICD-10-CM | POA: Diagnosis not present

## 2015-02-10 DIAGNOSIS — I5030 Unspecified diastolic (congestive) heart failure: Secondary | ICD-10-CM | POA: Diagnosis present

## 2015-02-10 DIAGNOSIS — I429 Cardiomyopathy, unspecified: Secondary | ICD-10-CM | POA: Diagnosis not present

## 2015-02-10 DIAGNOSIS — N1330 Unspecified hydronephrosis: Secondary | ICD-10-CM | POA: Diagnosis not present

## 2015-02-10 DIAGNOSIS — N401 Enlarged prostate with lower urinary tract symptoms: Secondary | ICD-10-CM | POA: Diagnosis present

## 2015-02-10 DIAGNOSIS — Z9181 History of falling: Secondary | ICD-10-CM | POA: Diagnosis not present

## 2015-02-10 DIAGNOSIS — J449 Chronic obstructive pulmonary disease, unspecified: Secondary | ICD-10-CM | POA: Diagnosis not present

## 2015-02-10 DIAGNOSIS — I4891 Unspecified atrial fibrillation: Secondary | ICD-10-CM | POA: Diagnosis not present

## 2015-02-10 DIAGNOSIS — K352 Acute appendicitis with generalized peritonitis: Secondary | ICD-10-CM

## 2015-02-10 DIAGNOSIS — F0390 Unspecified dementia without behavioral disturbance: Secondary | ICD-10-CM | POA: Diagnosis present

## 2015-02-10 DIAGNOSIS — R791 Abnormal coagulation profile: Secondary | ICD-10-CM | POA: Diagnosis present

## 2015-02-10 DIAGNOSIS — K3533 Acute appendicitis with perforation and localized peritonitis, with abscess: Secondary | ICD-10-CM

## 2015-02-10 DIAGNOSIS — M1990 Unspecified osteoarthritis, unspecified site: Secondary | ICD-10-CM | POA: Diagnosis not present

## 2015-02-10 DIAGNOSIS — Z809 Family history of malignant neoplasm, unspecified: Secondary | ICD-10-CM | POA: Diagnosis not present

## 2015-02-10 DIAGNOSIS — Z8249 Family history of ischemic heart disease and other diseases of the circulatory system: Secondary | ICD-10-CM

## 2015-02-10 DIAGNOSIS — Z7901 Long term (current) use of anticoagulants: Secondary | ICD-10-CM | POA: Diagnosis not present

## 2015-02-10 DIAGNOSIS — R05 Cough: Secondary | ICD-10-CM | POA: Diagnosis not present

## 2015-02-10 DIAGNOSIS — R338 Other retention of urine: Secondary | ICD-10-CM | POA: Diagnosis present

## 2015-02-10 DIAGNOSIS — N179 Acute kidney failure, unspecified: Secondary | ICD-10-CM | POA: Diagnosis not present

## 2015-02-10 DIAGNOSIS — R4189 Other symptoms and signs involving cognitive functions and awareness: Secondary | ICD-10-CM | POA: Diagnosis present

## 2015-02-10 DIAGNOSIS — I714 Abdominal aortic aneurysm, without rupture: Secondary | ICD-10-CM | POA: Diagnosis not present

## 2015-02-10 DIAGNOSIS — R627 Adult failure to thrive: Secondary | ICD-10-CM | POA: Diagnosis not present

## 2015-02-10 DIAGNOSIS — I739 Peripheral vascular disease, unspecified: Secondary | ICD-10-CM | POA: Diagnosis not present

## 2015-02-10 DIAGNOSIS — R1114 Bilious vomiting: Secondary | ICD-10-CM

## 2015-02-10 DIAGNOSIS — N139 Obstructive and reflux uropathy, unspecified: Secondary | ICD-10-CM | POA: Diagnosis not present

## 2015-02-10 DIAGNOSIS — N390 Urinary tract infection, site not specified: Secondary | ICD-10-CM | POA: Diagnosis not present

## 2015-02-10 DIAGNOSIS — R1031 Right lower quadrant pain: Secondary | ICD-10-CM

## 2015-02-10 DIAGNOSIS — I5032 Chronic diastolic (congestive) heart failure: Secondary | ICD-10-CM | POA: Diagnosis not present

## 2015-02-10 DIAGNOSIS — N210 Calculus in bladder: Secondary | ICD-10-CM | POA: Diagnosis not present

## 2015-02-10 DIAGNOSIS — H409 Unspecified glaucoma: Secondary | ICD-10-CM | POA: Diagnosis present

## 2015-02-10 DIAGNOSIS — R197 Diarrhea, unspecified: Secondary | ICD-10-CM | POA: Diagnosis not present

## 2015-02-10 DIAGNOSIS — R509 Fever, unspecified: Secondary | ICD-10-CM

## 2015-02-10 DIAGNOSIS — R339 Retention of urine, unspecified: Secondary | ICD-10-CM | POA: Diagnosis not present

## 2015-02-10 DIAGNOSIS — I482 Chronic atrial fibrillation: Secondary | ICD-10-CM | POA: Diagnosis not present

## 2015-02-10 DIAGNOSIS — Z87891 Personal history of nicotine dependence: Secondary | ICD-10-CM

## 2015-02-10 LAB — URINALYSIS, ROUTINE W REFLEX MICROSCOPIC
Glucose, UA: NEGATIVE mg/dL
Ketones, ur: NEGATIVE mg/dL
NITRITE: NEGATIVE
Specific Gravity, Urine: <1.005 — ABNORMAL LOW (ref 1.005–1.030)
UROBILINOGEN UA: 1 mg/dL (ref 0.0–1.0)
pH: >9 — ABNORMAL HIGH (ref 5.0–8.0)

## 2015-02-10 LAB — COMPREHENSIVE METABOLIC PANEL
ALBUMIN: 2.9 g/dL — AB (ref 3.5–5.0)
ALT: 18 U/L (ref 17–63)
AST: 23 U/L (ref 15–41)
Alkaline Phosphatase: 74 U/L (ref 38–126)
Anion gap: 7 (ref 5–15)
BILIRUBIN TOTAL: 1.3 mg/dL — AB (ref 0.3–1.2)
BUN: 47 mg/dL — AB (ref 6–20)
CHLORIDE: 108 mmol/L (ref 101–111)
CO2: 25 mmol/L (ref 22–32)
Calcium: 8.7 mg/dL — ABNORMAL LOW (ref 8.9–10.3)
Creatinine, Ser: 3.3 mg/dL — ABNORMAL HIGH (ref 0.61–1.24)
GFR calc Af Amer: 19 mL/min — ABNORMAL LOW (ref >60)
GFR calc non Af Amer: 16 mL/min — ABNORMAL LOW (ref >60)
GLUCOSE: 213 mg/dL — AB (ref 65–99)
POTASSIUM: 4.5 mmol/L (ref 3.5–5.1)
Sodium: 140 mmol/L (ref 135–145)
Total Protein: 6.7 g/dL (ref 6.5–8.1)

## 2015-02-10 LAB — URINE MICROSCOPIC-ADD ON

## 2015-02-10 LAB — CBC
HEMATOCRIT: 38.6 % — AB (ref 39.0–52.0)
Hemoglobin: 12.4 g/dL — ABNORMAL LOW (ref 13.0–17.0)
MCH: 28.2 pg (ref 26.0–34.0)
MCHC: 32.1 g/dL (ref 30.0–36.0)
MCV: 87.9 fL (ref 78.0–100.0)
Platelets: 291 10*3/uL (ref 150–400)
RBC: 4.39 MIL/uL (ref 4.22–5.81)
RDW: 15.8 % — AB (ref 11.5–15.5)
WBC: 17.4 10*3/uL — AB (ref 4.0–10.5)

## 2015-02-10 LAB — MRSA PCR SCREENING: MRSA BY PCR: NEGATIVE

## 2015-02-10 LAB — PROTIME-INR
INR: 5.64 (ref 0.00–1.49)
Prothrombin Time: 49.2 seconds — ABNORMAL HIGH (ref 11.6–15.2)

## 2015-02-10 MED ORDER — DOXAZOSIN MESYLATE 4 MG PO TABS
8.0000 mg | ORAL_TABLET | Freq: Every day | ORAL | Status: DC
  Administered 2015-02-11 – 2015-02-13 (×3): 8 mg via ORAL
  Filled 2015-02-10: qty 4
  Filled 2015-02-10: qty 1
  Filled 2015-02-10 (×3): qty 2
  Filled 2015-02-10: qty 4
  Filled 2015-02-10: qty 2
  Filled 2015-02-10: qty 4

## 2015-02-10 MED ORDER — WARFARIN - PHARMACIST DOSING INPATIENT: Status: DC

## 2015-02-10 MED ORDER — SODIUM CHLORIDE 0.9 % IV SOLN: Freq: Once | INTRAVENOUS | Status: DC

## 2015-02-10 MED ORDER — FINASTERIDE 5 MG PO TABS
ORAL_TABLET | ORAL | Status: AC
  Filled 2015-02-10: qty 1

## 2015-02-10 MED ORDER — SODIUM CHLORIDE 0.9 % IV SOLN
INTRAVENOUS | Status: DC
  Administered 2015-02-10: 100 mL/h via INTRAVENOUS
  Administered 2015-02-12: 1 mL via INTRAVENOUS

## 2015-02-10 MED ORDER — PROMETHAZINE HCL 25 MG RE SUPP: 12.5000 mg | Freq: Four times a day (QID) | RECTAL | Status: DC | PRN

## 2015-02-10 MED ORDER — PNEUMOCOCCAL VAC POLYVALENT 25 MCG/0.5ML IJ INJ
0.5000 mL | INJECTION | INTRAMUSCULAR | Status: AC
  Administered 2015-02-11: 0.5 mL via INTRAMUSCULAR
  Filled 2015-02-10: qty 0.5

## 2015-02-10 MED ORDER — FINASTERIDE 5 MG PO TABS
5.0000 mg | ORAL_TABLET | Freq: Every day | ORAL | Status: DC
  Administered 2015-02-10 – 2015-02-12 (×3): 5 mg via ORAL
  Filled 2015-02-10 (×5): qty 1

## 2015-02-10 MED ORDER — ZINC OXIDE 12.8 % EX OINT
TOPICAL_OINTMENT | CUTANEOUS | Status: DC
  Filled 2015-02-10: qty 56.7

## 2015-02-10 MED ORDER — WARFARIN - PHARMACIST DOSING INPATIENT: Freq: Every day | Status: DC

## 2015-02-10 MED ORDER — IOHEXOL 300 MG/ML  SOLN
50.0000 mL | Freq: Once | INTRAMUSCULAR | Status: AC | PRN
  Administered 2015-02-10: 50 mL via ORAL

## 2015-02-10 MED ORDER — LATANOPROST 0.005 % OP SOLN
1.0000 [drp] | Freq: Every day | OPHTHALMIC | Status: DC
  Administered 2015-02-10 – 2015-02-12 (×3): 1 [drp] via OPHTHALMIC
  Filled 2015-02-10: qty 2.5

## 2015-02-10 MED ORDER — INFLUENZA VAC SPLIT QUAD 0.5 ML IM SUSY
0.5000 mL | PREFILLED_SYRINGE | INTRAMUSCULAR | Status: AC
  Administered 2015-02-11: 0.5 mL via INTRAMUSCULAR
  Filled 2015-02-10: qty 0.5

## 2015-02-10 MED ORDER — ONDANSETRON HCL 4 MG/2ML IJ SOLN: 4.0000 mg | Freq: Four times a day (QID) | INTRAMUSCULAR | Status: DC | PRN

## 2015-02-10 MED ORDER — LATANOPROST 0.005 % OP SOLN
OPHTHALMIC | Status: AC
  Filled 2015-02-10: qty 2.5

## 2015-02-10 MED ORDER — PRO-STAT SUGAR FREE PO LIQD
30.0000 mL | Freq: Every day | ORAL | Status: DC
  Administered 2015-02-10 – 2015-02-12 (×3): 30 mL via ORAL
  Filled 2015-02-10 (×4): qty 30

## 2015-02-10 MED ORDER — ACETAMINOPHEN 325 MG RE SUPP
325.0000 mg | Freq: Once | RECTAL | Status: AC
  Administered 2015-02-10: 325 mg via RECTAL
  Filled 2015-02-10: qty 1

## 2015-02-10 MED ORDER — IPRATROPIUM-ALBUTEROL 0.5-2.5 (3) MG/3ML IN SOLN: 3.0000 mL | Freq: Four times a day (QID) | RESPIRATORY_TRACT | Status: DC | PRN

## 2015-02-10 MED ORDER — METOPROLOL TARTRATE 25 MG PO TABS
12.5000 mg | ORAL_TABLET | Freq: Two times a day (BID) | ORAL | Status: DC
  Administered 2015-02-10 – 2015-02-13 (×6): 12.5 mg via ORAL
  Filled 2015-02-10 (×6): qty 1

## 2015-02-10 MED ORDER — MORPHINE SULFATE (PF) 4 MG/ML IV SOLN
4.0000 mg | Freq: Once | INTRAVENOUS | Status: AC
  Administered 2015-02-10: 4 mg via INTRAVENOUS
  Filled 2015-02-10: qty 1

## 2015-02-10 MED ORDER — DEXTROSE 5 % IV SOLN
INTRAVENOUS | Status: AC
  Filled 2015-02-10: qty 10

## 2015-02-10 MED ORDER — ACETAMINOPHEN 500 MG PO TABS: 1000.0000 mg | ORAL_TABLET | Freq: Four times a day (QID) | ORAL | Status: DC | PRN

## 2015-02-10 MED ORDER — ACETAMINOPHEN 325 MG PO TABS
650.0000 mg | ORAL_TABLET | Freq: Four times a day (QID) | ORAL | Status: DC | PRN
  Administered 2015-02-12: 650 mg via ORAL
  Filled 2015-02-10: qty 2

## 2015-02-10 MED ORDER — ONDANSETRON HCL 4 MG PO TABS: 4.0000 mg | ORAL_TABLET | Freq: Four times a day (QID) | ORAL | Status: DC | PRN

## 2015-02-10 MED ORDER — DEXTROSE 5 % IV SOLN
1.0000 g | INTRAVENOUS | Status: DC
  Administered 2015-02-10 – 2015-02-12 (×3): 1 g via INTRAVENOUS
  Filled 2015-02-10 (×3): qty 10

## 2015-02-10 MED ORDER — DEXTROSE 5 % IV SOLN
1.0000 g | Freq: Once | INTRAVENOUS | Status: AC
  Administered 2015-02-10: 1 g via INTRAVENOUS
  Filled 2015-02-10: qty 10

## 2015-02-10 MED ORDER — SERTRALINE HCL 50 MG PO TABS
50.0000 mg | ORAL_TABLET | Freq: Every day | ORAL | Status: DC
  Administered 2015-02-10 – 2015-02-13 (×4): 50 mg via ORAL
  Filled 2015-02-10 (×4): qty 1

## 2015-02-10 MED ORDER — DILTIAZEM HCL ER COATED BEADS 120 MG PO CP24
120.0000 mg | ORAL_CAPSULE | Freq: Every day | ORAL | Status: DC
  Administered 2015-02-11 – 2015-02-13 (×3): 120 mg via ORAL
  Filled 2015-02-10 (×3): qty 1

## 2015-02-10 MED ORDER — IBUPROFEN 400 MG PO TABS
600.0000 mg | ORAL_TABLET | Freq: Once | ORAL | Status: AC
  Administered 2015-02-10: 600 mg via ORAL
  Filled 2015-02-10: qty 2

## 2015-02-10 NOTE — ED Provider Notes (Signed)
CSN: [id number]     Arrival date & time 02/10/15  1040 History   This chart was scribed for Joel Razor, MD by Murriel Hopper, ED Scribe. This patient was seen in room APA11/APA11 and the patient's care was started at 11:17 AM.  Chief Complaint  Patient presents with  . Abdominal Pain     The history is provided by the patient. No language interpreter was used.   HPI Comments: [name] is a 81 y.o. male who presents to the Emergency Department complaining of constant, worsening RLQ abdominal pain with associated nausea, vomiting, diarrhea, fever that has been present since yesterday. Pt states he is not currently in any pain, but notes his pain was so bad today that he felt he needed to come to ED. Pt also reports having an intermittent cough that has been present for about a week or so and states his abdominal pain worsens when he coughs. Pt reports a hx of gallbladder removal and subsequent gallbladder surgeries as well.    Past Medical History  Diagnosis Date  . Venous insufficiency   . AAA (abdominal aortic aneurysm)   . Ulcer   . DVT (deep venous thrombosis) 05/04/2013    "LLE"  . Cellulitis   . Nicotine dependence   . Bronchospasm   . Elevated PSA   . BPH (benign prostatic hyperplasia)   . COPD (chronic obstructive pulmonary disease)   . Cognitive impairment   . Daily headache   . Arthritis     "probably on the right leg/hip" (09/20/2013)  . Dementia     "don't know exactly what kind" (09/20/2013)  . Kidney stones   . Glaucoma, both eyes   . Atrial fibrillation   . Fall at home September 18, 2013  . Benign localized hyperplasia of prostate with urinary retention 12/23/2014  . AAA (abdominal aortic aneurysm) without rupture 10/06/2012  . Anticoagulation goal of INR 2 to 3 06/01/2013  . B12 deficiency 09/20/2013  . Bladder calculus 12/23/2014    2.6cm and present since at least 2011 when it was about 1.6cm.   . Cardiomyopathy 10/15/2014  . CHF (congestive heart failure)  09/26/2013  . Chronic venous insufficiency 10/06/2012  . Edema-Bilat Leg and foot 03/07/2013  . Multifactorial dementia 10/06/2012  . Obesity, unspecified 10/06/2012  . Peripheral vascular disease 09/20/2012  . Pyelonephritis, acute 12/22/2014  . Weakness 09/18/2013   Past Surgical History  Procedure Laterality Date  . Cataract extraction w/ intraocular lens  implant, bilateral Bilateral   . Insitu percutaneous pinning femur Right 1986  . Hip fracture surgery Right 2009  . Back surgery    . Posterior laminectomy / decompression lumbar spine  2004    Decompressive laminectomy unilaterally on the right at L4-5 with  . Femur fracture surgery Right 04/1985  . Cystoscopy w/ stone manipulation  1970's    "once"  . Spine surgery    . Eye surgery    . Cholecystectomy  2011    Gall Bladder   Family History  Problem Relation Age of Onset  . Aneurysm Brother   . Heart attack Brother     10-29-12  . Hypertension Brother   . Heart disease Brother     AAA  . Cancer Father     colon   Social History  Substance Use Topics  . Smoking status: Former Smoker -- 2.00 packs/day for 54 years    Types: Cigarettes    Quit date: 08/24/2002  . Smokeless tobacco: Current User  Types: Snuff     Comment: 09/20/2013 "dips snuff"  . Alcohol Use: No    Review of Systems  Constitutional: Positive for fever.  Gastrointestinal: Positive for nausea, vomiting, abdominal pain and diarrhea.  All other systems reviewed and are negative.     Allergies  Oxycodone hcl and Propoxyphene n-acetaminophen  Home Medications   Prior to Admission medications   Medication Sig Start Date End Date Taking? Authorizing Provider  acetaminophen (TYLENOL) 500 MG tablet Take 1,000 mg by mouth every 6 (six) hours as needed for mild pain or fever.    Yes Historical Provider, MD  Amino Acids-Protein Hydrolys (FEEDING SUPPLEMENT, PRO-STAT SUGAR FREE 64,) LIQD Take 30 mLs by mouth daily.   Yes Historical Provider, MD  diltiazem  (CARDIZEM CD) 120 MG 24 hr capsule Take 120 mg by mouth daily.  10/10/14  Yes Historical Provider, MD  doxazosin (CARDURA) 8 MG tablet Take 8 mg by mouth daily.   Yes Historical Provider, MD  finasteride (PROSCAR) 5 MG tablet Take 5 mg by mouth at bedtime.    Yes Historical Provider, MD  ipratropium-albuterol (DUONEB) 0.5-2.5 (3) MG/3ML SOLN Take 3 mLs by nebulization every 6 (six) hours as needed (shortness of breath).   Yes Historical Provider, MD  latanoprost (XALATAN) 0.005 % ophthalmic solution Place 1 drop into both eyes at bedtime.  01/25/12  Yes Historical Provider, MD  metoprolol tartrate (LOPRESSOR) 25 MG tablet Take 0.5 tablets (12.5 mg total) by mouth 2 (two) times daily. 12/26/14  Yes Renae Fickle, MD  promethazine (PHENERGAN) 12.5 MG suppository Place 12.5 mg rectally every 6 (six) hours as needed for nausea or vomiting.   Yes Historical Provider, MD  sertraline (ZOLOFT) 50 MG tablet Take 50 mg by mouth daily.   Yes Historical Provider, MD  warfarin (COUMADIN) 5 MG tablet Take 1 tablet (5 mg total) by mouth daily at 6 PM. Patient taking differently: Take 4 mg by mouth daily at 6 PM.  12/26/14  Yes Renae Fickle, MD  ZINC OXIDE EX Apply 1 application topically once a week. Infused wrap for legs   Yes Historical Provider, MD   BP 137/68 mmHg  Pulse 99  Temp(Src) 100.3 F (37.9 C) (Rectal)  Resp 18  SpO2 97% Physical Exam  Constitutional: He is oriented to person, place, and time. He appears well-developed and well-nourished.  HENT:  Head: Normocephalic and atraumatic.  Eyes: EOM are normal.  Neck: Normal range of motion.  Cardiovascular: Normal rate, regular rhythm, normal heart sounds and intact distal pulses.   Pulmonary/Chest: Effort normal and breath sounds normal. No respiratory distress.  Abdominal: Soft. He exhibits no distension. There is no tenderness.  Suprapubic and RLQ tenderness No rebound or guarding Foley catheter in place. No urine noted in bag.     Musculoskeletal: Normal range of motion.  Neurological: He is alert and oriented to person, place, and time.  Skin: Skin is warm and dry.  Psychiatric: He has a normal mood and affect. Judgment normal.  Nursing note and vitals reviewed.   ED Course  Procedures (including critical care time)  DIAGNOSTIC STUDIES: Oxygen Saturation is 97% on room air, normal by my interpretation.    COORDINATION OF CARE: 11:20 AM Discussed treatment plan with pt at bedside and pt agreed to plan.   Labs Review Labs Reviewed  URINALYSIS, ROUTINE W REFLEX MICROSCOPIC (NOT AT [facility]) - Abnormal; Notable for the following:    APPearance CLOUDY (*)    Specific Gravity, Urine <1.005 (*)  pH >9.0 (*)    Hgb urine dipstick SMALL (*)    Bilirubin Urine SMALL (*)    Protein, ur >300 (*)    Leukocytes, UA MODERATE (*)    All other components within normal limits  CBC - Abnormal; Notable for the following:    WBC 17.4 (*)    Hemoglobin 12.4 (*)    HCT 38.6 (*)    RDW 15.8 (*)    All other components within normal limits  COMPREHENSIVE METABOLIC PANEL - Abnormal; Notable for the following:    Glucose, Bld 213 (*)    BUN 47 (*)    Creatinine, Ser 3.30 (*)    Calcium 8.7 (*)    Albumin 2.9 (*)    Total Bilirubin 1.3 (*)    GFR calc non Af Amer 16 (*)    GFR calc Af Amer 19 (*)    All other components within normal limits  URINE MICROSCOPIC-ADD ON - Abnormal; Notable for the following:    Squamous Epithelial / LPF FEW (*)    Bacteria, UA MANY (*)    Crystals TRIPLE PHOSPHATE CRYSTALS (*)    All other components within normal limits  PROTIME-INR - Abnormal; Notable for the following:    Prothrombin Time 49.2 (*)    INR 5.64 (*)    All other components within normal limits  URINE CULTURE    Imaging Review Ct Abdomen Pelvis Wo Contrast  02/10/2015   CLINICAL DATA:  Fever, right lower quadrant abdominal pain and diarrhea.  EXAM: CT ABDOMEN AND PELVIS WITHOUT CONTRAST  TECHNIQUE: Multidetector CT  imaging of the abdomen and pelvis was performed following the standard protocol without IV contrast.  COMPARISON:  CT 12/22/2014  FINDINGS: Lower chest: Bibasilar bronchial wall thickening and areas of inspissated secretions noted. Trace pleural effusions noted with associated compressive atelectasis.  Hepatobiliary: Cholecystectomy clips are noted. Left hepatic lobe atrophy reidentified with a small amount of biliary gas reidentified. No intrahepatic ductal dilatation or common duct dilatation.  Pancreas: The pancreas is fatty replaced. No peripancreatic fluid collection.  Spleen: Normal  Adrenals/Urinary Tract: Adrenal glands are normal. Lobulated renal contour noted bilaterally with areas of too small to characterize hypodensity and other hypodense lesions which can be confidently determined to be cysts, largest of which in the right upper pole 7.6 cm image 40. There is bilateral moderate hydroureteronephrosis to the level of the ureterovesicular junctions. The bladder is distended, with a Foley catheter in place. Bladder calculus reidentified dependently. A small amount of perinephric fluid is identified bilaterally, tracking to the pelvis.  Stomach/Bowel: No bowel wall thickening or focal segmental dilatation is identified.  Vascular/Lymphatic: Infrarenal abdominal aortic aneurysm measuring 4.3 cm image 57 is unchanged. No periaortic fluid. No lymphadenopathy. Moderate atheromatous vascular calcifications.  Other: No free air.  Trace pelvic free fluid.  Musculoskeletal: Degenerative change at the spine noted.  IMPRESSION: Interval development of moderate bilateral hydroureteronephrosis with diffuse bladder dilatation and bladder calculus. There is no radiopaque calculus seen in either ureter. Findings raise the question of bladder outlet obstruction/Foley catheter obstruction, bladder mass obstructing the bilateral ureterovesicular junctions but occult at noncontrast imaging, or less likely vesicoureteral  reflux. Contrast-enhanced exam with delayed imaging through the renal collecting systems is recommended for further evaluation. If the patient cannot undergo that exam, then cystoscopy is recommended as well as bladder decompression.   Electronically Signed   By: Christiana Pellant M.D.   On: 02/10/2015 15:04   Dg Chest Portable 1 View  02/10/2015  CLINICAL DATA:  Right lower quadrant abdominal pain, cough and fever  EXAM: PORTABLE CHEST - 1 VIEW  COMPARISON:  01/11/2015  FINDINGS: Moderate enlargement of the cardiac silhouette is noted. No focal pulmonary opacity. Lungs are hypoaerated with crowding of the bronchovascular markings. Trace if any pleural fluid.  IMPRESSION: Suboptimal visualization due to patient positioning and portable technique. Cardiomegaly without focal acute finding. If symptoms persist, consider PA and lateral chest radiographs obtained at full inspiration when the patient is clinically able.   Electronically Signed   By: Christiana Pellant M.D.   On: 02/10/2015 12:20   I have personally reviewed and evaluated these images and lab results as part of my medical decision-making.   EKG Interpretation None      MDM   Final diagnoses:  Obstructive uropathy  UTI (lower urinary tract infection)  AKI (acute kidney injury)    83yM with fever, abdominal pain. CT consistent with bladder obstruction. Does have bladder stone and known BPH, but I suspect acute issue is debris from UTI. Foley changed with good output now. AKI from obstructive uropathy. Expect to improve with functioning catheter. Rocephin. Urine culture. Supratherapeutic INR but no overt bleeding so no acute intervention. Will discuss with medicine for admission.   I personally preformed the services scribed in my presence. The recorded information has been reviewed is accurate. Joel Razor, MD.      Joel Razor, MD 02/11/15 [phone]

## 2015-02-10 NOTE — Progress Notes (Signed)
Patient ID: [name], male   DOB: 05-Feb-1932, 81 y.o.   MRN: [id number] Facility; [facility] Chief complaint; "loose stool", vomiting, fever History; this is a patient that I was called about yesterday on-call. The history was that he had had 3 large loose stools started on Friday evening. He then developed multiple episodes of bilious emesis yesterday and continued loose stools. His vital signs were stable. Apparently his abdomen was nontender. He became febrile late yesterday. We started him on intravenous fluid D5 normal saline at 80 cc an hour. 2 staff members apparently have similar symptoms but no other patients. The patient has a chronic Foley catheter. Put out 500 on evenings yesterday and 400 overnight. He was hospitalized from 7/23 through 12/26/2014 with pyelonephritis. He has not had any antibiotics since then.  The patient states he is not having spontaneous abdominal pain but it does hurt when he rolls over and he gestures towards his right lower quadrant. His temperature this morning is 101.4 and he has received Tylenol. His temperature is down now to 96.1. The patient is correctly able to say that he has had a cholecystectomy, but no appendectomy.  Past Medical History  Diagnosis Date  . Venous insufficiency   . AAA (abdominal aortic aneurysm)   . Ulcer   . DVT (deep venous thrombosis) 05/04/2013    "LLE"  . Cellulitis   . Nicotine dependence   . Bronchospasm   . Elevated PSA   . BPH (benign prostatic hyperplasia)   . COPD (chronic obstructive pulmonary disease)   . Cognitive impairment   . Daily headache   . Arthritis     "probably on the right leg/hip" (09/20/2013)  . Dementia     "don't know exactly what kind" (09/20/2013)  . Kidney stones   . Glaucoma, both eyes   . Atrial fibrillation   . Fall at home September 18, 2013  . Benign localized hyperplasia of prostate with urinary retention 12/23/2014  . AAA (abdominal aortic aneurysm) without rupture 10/06/2012  .  Anticoagulation goal of INR 2 to 3 06/01/2013  . B12 deficiency 09/20/2013  . Bladder calculus 12/23/2014    2.6cm and present since at least 2011 when it was about 1.6cm.   . Cardiomyopathy 10/15/2014  . CHF (congestive heart failure) 09/26/2013  . Chronic venous insufficiency 10/06/2012  . Edema-Bilat Leg and foot 03/07/2013  . Multifactorial dementia 10/06/2012  . Obesity, unspecified 10/06/2012  . Peripheral vascular disease 09/20/2012  . Pyelonephritis, acute 12/22/2014  . Weakness 09/18/2013   Past Surgical History  Procedure Laterality Date  . Cataract extraction w/ intraocular lens  implant, bilateral Bilateral   . Insitu percutaneous pinning femur Right 1986  . Hip fracture surgery Right 2009  . Back surgery    . Posterior laminectomy / decompression lumbar spine  2004    Decompressive laminectomy unilaterally on the right at L4-5 with  . Femur fracture surgery Right 04/1985  . Cystoscopy w/ stone manipulation  1970's    "once"  . Spine surgery    . Eye surgery    . Cholecystectomy  2011    Gall Bladder    Current medications Tylenol thousand every 6 hours when necessary pain Phenergan 12.5 rectally when necessary nausea and vomiting Duo nebs when necessary shortness of breath Coumadin 4 mg once a day Finasteride 5 mg once a day Metoprolol 25 one half tablet 12.5 mg twice a day Protostat 30 ML's once a day Diltiazem 120 mg daily Doxazosin 8 mg  once a day Zoloft 50 mg once a day  Review of systems Gen. the patient does not feel well but no chills HEENT no sore throat Respiratory no shortness of breath or cough Cardiac no chest pain GI; as above started with a diarrheal illness Friday night the vomiting and diarrhea 3 yesterday. The patient is been unable to keep anything on his stomach. We started him on IV fluids yesterday he has had a liter of D5 normal saline now starting on his second liter. There's been no melena no hematochezia no hematemesis. GU; chronic Foley catheter  secondary to BPH follows with urology Musculoskeletal; no joint pain no back pain. He is largely immobile secondary to severe bilateral osteoarthritis in his knees and hips. He is a Physiological scientist here. Neurologic; no focal weakness he feels generally weak Mental status; no current depression. He has mild dementia Extremities; he has severe chronic venous insufficiency with inflammation and ulceration. He is chronically wrapped with a Kerlix Coban wrap to control edema and to prevent further skin breakdown  Physical examination Gen. patient looks unwell Vitals; temperature was 101.4 earlier this morning he was given Tylenol temperature is 96.1 now respirations 22 pulse 95 blood pressure 140/59 HEENT; mucous membranes are dry Lymph; nonpalpable in the cervical clavicular or inguinal areas Respiratory; shallow air entry compatible with his known COPD there is no wheezing no crackles his work of breathing appears normal Cardiac; heart sounds are normal. Pulse volume is full. 2/6 systolic ejection murmur no S3 Abdomen; mildly distended and very quiet. He is not particularly tender in the left upper left lower quadrants nor the right upper quadrant. However he is extremely tender with guarding in the right lower quadrant. It would appear there was a laparoscopic cholecystectomy. Rectal; examination is deferred due to extreme right lower quadrant tenderness GU chronic Foley no CVA tenderness is noted Extremities; he has severe bilateral venous insufficiency and secondary venous inflammation. Some small open areas on the right anterior leg I don't believe there is any cellulitis here Neurologic nonlateralizing he is able to move all his limbs  Impression/plan #1 I was called about this man yesterday with a history of diarrhea and then vomiting without a history of obvious bloody vomitus or melena or hematochezia, and then subsequently fever. I thought this was probably a viral illness and that  thought seemed more likely when apparently 2 staff members are off with the same sort of thing. Nevertheless when I saw him acutely today I'm impressed with the right lower quadrant pain and guarding and I am worried that he has appendicitis. For this reason I'm going to send him to the ER for imaging studies. He would be at risk for pseudomembranous colitis.  #2 history of BPH with urinary retention he has a Foley catheter. He would be at risk for pyelonephritis although starting with significant diarrhea would be unusual. He also has a history of a large bladder stone. He was supposed to follow-up with urology here I am not sure where this is gone. #3 diastolic heart failure as far as I'm aware this is been stable. If anything he is volume contracted #4 history of atrial fibrillation on chronic Coumadin. #5 recent history of a left leg DVT; his last INR was 2.73 on 8/29 on 4 mg of Coumadin #6 mild dementia; his mental status currently seems at baseline.  I am concerned about the right lower quadrant tenderness and guarding and I wonder whether this patient may have appendicitis. There  are other possibilities here but I think this thought dictates a trip to the emergency room. His abdomen is also very quiet and he may very well have peritonitis. His abdomen was not tender yesterday according to the staff who called me. I'll make a call through to the ER triage to express my concerns #3 history of DVT left leg Psychiatric; the patient appears to be at his baseline he has mild dementia no overt depression.

## 2015-02-10 NOTE — ED Notes (Signed)
Pt received apprx iv NS upon arrival to ED

## 2015-02-10 NOTE — H&P (Signed)
[facility] History and Physical  [name] ZOX:[id number] DOB: 1932/03/31 DOA: 02/10/2015  Referring physician: ER PCP: Frederica Kuster, MD   Chief Complaint: Fever, diarrhea, abdominal tenderness.  HPI: [name] is a 81 y.o. male  This is an 81 year old man who was sent over from the [facility] center because he had a fever yesterday associated with diarrhea and vomiting and nausea and today Dr. Leanord Hawking, who examined him, felt that he had right lower quadrant tenderness, raising the possibility of acute appendicitis. When he presented to the emergency room, he was found to be in acute renal failure and CT abdominal scan showed bilateral hydronephrosis. He has a fairly permanent Foley catheter in situ and this catheter was changed approximately 3-4 weeks ago. A new catheter was placed in the emergency room today with drainage of purulent urine, significant amounts. His abdominal discomfort appears to have improved. He is now being admitted for further management.   Review of Systems:  Apart from symptoms above, all systems negative.  Past Medical History  Diagnosis Date  . Venous insufficiency   . AAA (abdominal aortic aneurysm)   . Ulcer   . DVT (deep venous thrombosis) 05/04/2013    "LLE"  . Cellulitis   . Nicotine dependence   . Bronchospasm   . Elevated PSA   . BPH (benign prostatic hyperplasia)   . COPD (chronic obstructive pulmonary disease)   . Cognitive impairment   . Daily headache   . Arthritis     "probably on the right leg/hip" (09/20/2013)  . Dementia     "don't know exactly what kind" (09/20/2013)  . Kidney stones   . Glaucoma, both eyes   . Atrial fibrillation   . Fall at home September 18, 2013  . Benign localized hyperplasia of prostate with urinary retention 12/23/2014  . AAA (abdominal aortic aneurysm) without rupture 10/06/2012  . Anticoagulation goal of INR 2 to 3 06/01/2013  . B12 deficiency 09/20/2013  . Bladder calculus 12/23/2014    2.6cm and present  since at least 2011 when it was about 1.6cm.   . Cardiomyopathy 10/15/2014  . CHF (congestive heart failure) 09/26/2013  . Chronic venous insufficiency 10/06/2012  . Edema-Bilat Leg and foot 03/07/2013  . Multifactorial dementia 10/06/2012  . Obesity, unspecified 10/06/2012  . Peripheral vascular disease 09/20/2012  . Pyelonephritis, acute 12/22/2014  . Weakness 09/18/2013   Past Surgical History  Procedure Laterality Date  . Cataract extraction w/ intraocular lens  implant, bilateral Bilateral   . Insitu percutaneous pinning femur Right 1986  . Hip fracture surgery Right 2009  . Back surgery    . Posterior laminectomy / decompression lumbar spine  2004    Decompressive laminectomy unilaterally on the right at L4-5 with  . Femur fracture surgery Right 04/1985  . Cystoscopy w/ stone manipulation  1970's    "once"  . Spine surgery    . Eye surgery    . Cholecystectomy  2011    Gall Bladder   Social History:  reports that he quit smoking about 12 years ago. His smoking use included Cigarettes. He has a 108 pack-year smoking history. His smokeless tobacco use includes Snuff. He reports that he does not drink alcohol or use illicit drugs.  Allergies  Allergen Reactions  . Oxycodone Hcl Other (See Comments)     makes pt "wild"  . Propoxyphene N-Acetaminophen Other (See Comments)     Makes pt. "wild"    Family History  Problem Relation Age of  Onset  . Aneurysm Brother   . Heart attack Brother     10-29-12  . Hypertension Brother   . Heart disease Brother     AAA  . Cancer Father     colon    Prior to Admission medications   Medication Sig Start Date End Date Taking? Authorizing Provider  acetaminophen (TYLENOL) 500 MG tablet Take 1,000 mg by mouth every 6 (six) hours as needed for mild pain or fever.    Yes Historical Provider, MD  Amino Acids-Protein Hydrolys (FEEDING SUPPLEMENT, PRO-STAT SUGAR FREE 64,) LIQD Take 30 mLs by mouth daily.   Yes Historical Provider, MD  diltiazem  (CARDIZEM CD) 120 MG 24 hr capsule Take 120 mg by mouth daily.  10/10/14  Yes Historical Provider, MD  doxazosin (CARDURA) 8 MG tablet Take 8 mg by mouth daily.   Yes Historical Provider, MD  finasteride (PROSCAR) 5 MG tablet Take 5 mg by mouth at bedtime.    Yes Historical Provider, MD  ipratropium-albuterol (DUONEB) 0.5-2.5 (3) MG/3ML SOLN Take 3 mLs by nebulization every 6 (six) hours as needed (shortness of breath).   Yes Historical Provider, MD  latanoprost (XALATAN) 0.005 % ophthalmic solution Place 1 drop into both eyes at bedtime.  01/25/12  Yes Historical Provider, MD  metoprolol tartrate (LOPRESSOR) 25 MG tablet Take 0.5 tablets (12.5 mg total) by mouth 2 (two) times daily. 12/26/14  Yes Renae Fickle, MD  promethazine (PHENERGAN) 12.5 MG suppository Place 12.5 mg rectally every 6 (six) hours as needed for nausea or vomiting.   Yes Historical Provider, MD  sertraline (ZOLOFT) 50 MG tablet Take 50 mg by mouth daily.   Yes Historical Provider, MD  warfarin (COUMADIN) 5 MG tablet Take 1 tablet (5 mg total) by mouth daily at 6 PM. Patient taking differently: Take 4 mg by mouth daily at 6 PM.  12/26/14  Yes Renae Fickle, MD  ZINC OXIDE EX Apply 1 application topically once a week. Infused wrap for legs   Yes Historical Provider, MD   Physical Exam: Filed Vitals:   02/10/15 1530 02/10/15 1600 02/10/15 1630 02/10/15 1700  BP: 135/69 124/64 125/64 131/59  Pulse: 89 94 94 93  Temp:      TempSrc:      Resp: SpO2: 92% 94% 93% 94%    Wt Readings from Last 3 Encounters:  01/04/15 146.058 kg (322 lb)  12/26/14 150.4 kg (331 lb 9.2 oz)  10/17/14 145.4 kg (320 lb 8.8 oz)    General:  Appears somewhat dehydrated. He is not toxic or septic clinically. Eyes: PERRL, normal lids, irises & conjunctiva ENT: grossly normal hearing, lips & tongue Neck: no LAD, masses or thyromegaly Cardiovascular: RRR, no m/r/g. No LE edema. Telemetry: SR, no arrhythmias  Respiratory: CTA  bilaterally, no w/r/r. Normal respiratory effort. Abdomen: soft, ntnd Skin: no rash or induration seen on limited exam Musculoskeletal: grossly normal tone BUE/BLE Psychiatric: grossly normal mood and affect, speech fluent and appropriate Neurologic: grossly non-focal.          Labs on Admission:  Basic Metabolic Panel:  Recent Labs Lab 02/10/15 1120  NA 140  K 4.5  CL 108  CO2 25  GLUCOSE 213*  BUN 47*  CREATININE 3.30*  CALCIUM 8.7*   Liver Function Tests:  Recent Labs Lab 02/10/15 1120  AST 23  ALT 18  ALKPHOS 74  BILITOT 1.3*  PROT 6.7  ALBUMIN 2.9*   No results for input(s): LIPASE, AMYLASE in  the last 168 hours. No results for input(s): AMMONIA in the last 168 hours. CBC:  Recent Labs Lab 02/10/15 1120  WBC 17.4*  HGB 12.4*  HCT 38.6*  MCV 87.9  PLT 291   Cardiac Enzymes: No results for input(s): CKTOTAL, CKMB, CKMBINDEX, TROPONINI in the last 168 hours.  BNP (last 3 results) No results for input(s): BNP in the last 8760 hours.  ProBNP (last 3 results) No results for input(s): PROBNP in the last 8760 hours.  CBG: No results for input(s): GLUCAP in the last 168 hours.  Radiological Exams on Admission: Ct Abdomen Pelvis Wo Contrast  02/10/2015   CLINICAL DATA:  Fever, right lower quadrant abdominal pain and diarrhea.  EXAM: CT ABDOMEN AND PELVIS WITHOUT CONTRAST  TECHNIQUE: Multidetector CT imaging of the abdomen and pelvis was performed following the standard protocol without IV contrast.  COMPARISON:  CT 12/22/2014  FINDINGS: Lower chest: Bibasilar bronchial wall thickening and areas of inspissated secretions noted. Trace pleural effusions noted with associated compressive atelectasis.  Hepatobiliary: Cholecystectomy clips are noted. Left hepatic lobe atrophy reidentified with a small amount of biliary gas reidentified. No intrahepatic ductal dilatation or common duct dilatation.  Pancreas: The pancreas is fatty replaced. No peripancreatic fluid  collection.  Spleen: Normal  Adrenals/Urinary Tract: Adrenal glands are normal. Lobulated renal contour noted bilaterally with areas of too small to characterize hypodensity and other hypodense lesions which can be confidently determined to be cysts, largest of which in the right upper pole 7.6 cm image 40. There is bilateral moderate hydroureteronephrosis to the level of the ureterovesicular junctions. The bladder is distended, with a Foley catheter in place. Bladder calculus reidentified dependently. A small amount of perinephric fluid is identified bilaterally, tracking to the pelvis.  Stomach/Bowel: No bowel wall thickening or focal segmental dilatation is identified.  Vascular/Lymphatic: Infrarenal abdominal aortic aneurysm measuring 4.3 cm image 57 is unchanged. No periaortic fluid. No lymphadenopathy. Moderate atheromatous vascular calcifications.  Other: No free air.  Trace pelvic free fluid.  Musculoskeletal: Degenerative change at the spine noted.  IMPRESSION: Interval development of moderate bilateral hydroureteronephrosis with diffuse bladder dilatation and bladder calculus. There is no radiopaque calculus seen in either ureter. Findings raise the question of bladder outlet obstruction/Foley catheter obstruction, bladder mass obstructing the bilateral ureterovesicular junctions but occult at noncontrast imaging, or less likely vesicoureteral reflux. Contrast-enhanced exam with delayed imaging through the renal collecting systems is recommended for further evaluation. If the patient cannot undergo that exam, then cystoscopy is recommended as well as bladder decompression.   Electronically Signed   By: Christiana Pellant M.D.   On: 02/10/2015 15:04   Dg Chest Portable 1 View  02/10/2015   CLINICAL DATA:  Right lower quadrant abdominal pain, cough and fever  EXAM: PORTABLE CHEST - 1 VIEW  COMPARISON:  01/11/2015  FINDINGS: Moderate enlargement of the cardiac silhouette is noted. No focal pulmonary opacity.  Lungs are hypoaerated with crowding of the bronchovascular markings. Trace if any pleural fluid.  IMPRESSION: Suboptimal visualization due to patient positioning and portable technique. Cardiomegaly without focal acute finding. If symptoms persist, consider PA and lateral chest radiographs obtained at full inspiration when the patient is clinically able.   Electronically Signed   By: Christiana Pellant M.D.   On: 02/10/2015 12:20    EKG: Independently reviewed. Sinus rhythm today with no acute ST-T wave changes.  Assessment/Plan   1. Obstructive uropathy. This is likely from blockage of the Foley catheter. A new Foley catheter has been placed.  This appears to have improved matters. He is due to see his urologist Dr. Annabell Howells  on 02/15/2015. 2. Acute renal failure. This is from obstructive uropathy. Monitor renal function closely but it should improve with placement of new Foley catheter. 3. UTI. Intravenous antibiotics. 4. Dementia. This appears to be moderate to severe. 5. History of congestive heart failure. He appears to be compensated at the present time, somewhat hypovolemic in fact. 6. History of atrial fibrillation. Patient is on warfarin for chronic anticoagulation therapy. INR is supratherapeutic. We will hold warfarin and ask pharmacy to monitor closely.  Further recommendations will depend on patient's hospital progress.   Code Status: Full code.   DVT Prophylaxis: Warfarin.  Family Communication: I discussed the plan with the patient's daughter and wife at the bedside.   Disposition Plan: Back to the skilled nursing facility when medically stable.   Time spent: 60 minutes.  Wilson Singer [facility] Pager [phone].

## 2015-02-10 NOTE — ED Notes (Signed)
Pt had episode of stool incontinence. Pt cleaned and linens changed. Barrier ointment applied to buttocks.

## 2015-02-10 NOTE — ED Notes (Signed)
Patients upper denture plate sent in patient belongings bag with patient.

## 2015-02-10 NOTE — ED Notes (Signed)
Large amount of purulent urine drainage noted while exchanging catheters. MD aware

## 2015-02-10 NOTE — ED Notes (Signed)
CRITICAL VALUE ALERT  Critical value received:  INR 5.64  Date of notification:  02/10/15  Time of notification:  1203  Critical value read back:Yes.    Nurse who received alert:  Berdine Dance RN  MD notified (1st page):  Juleen China  Time of first page:  1204  MD notified (2nd page):  Time of second page:  Responding MD:  Juleen China  Time MD responded:  [phone]

## 2015-02-10 NOTE — Progress Notes (Signed)
ANTICOAGULATION CONSULT NOTE Consult  Pharmacy Consult for Warfarin Indication: atrial fibrillation  Allergies  Allergen Reactions  . Oxycodone Hcl Other (See Comments)     makes pt "wild"  . Propoxyphene N-Acetaminophen Other (See Comments)     Makes pt. "wild"   Patient Measurements:    Vital Signs: Temp: 98.4 F (36.9 C) (09/11 1214) Temp Source: Oral (09/11 1214) BP: 131/59 mmHg (09/11 1700) Pulse Rate: 93 (09/11 1700)  Labs:  Recent Labs  02/10/15 1120  HGB 12.4*  HCT 38.6*  PLT 291  LABPROT 49.2*  INR 5.64*  CREATININE 3.30*    CrCl cannot be calculated (Unknown ideal weight.).  Medications:  Prescriptions prior to admission  Medication Sig Dispense Refill Last Dose  . acetaminophen (TYLENOL) 500 MG tablet Take 1,000 mg by mouth every 6 (six) hours as needed for mild pain or fever.    02/10/2015 at Unknown time  . Amino Acids-Protein Hydrolys (FEEDING SUPPLEMENT, PRO-STAT SUGAR FREE 64,) LIQD Take 30 mLs by mouth daily.   02/09/2015 at Unknown time  . diltiazem (CARDIZEM CD) 120 MG 24 hr capsule Take 120 mg by mouth daily.    02/10/2015 at Unknown time  . doxazosin (CARDURA) 8 MG tablet Take 8 mg by mouth daily.   02/10/2015 at Unknown time  . finasteride (PROSCAR) 5 MG tablet Take 5 mg by mouth at bedtime.    02/09/2015 at Unknown time  . ipratropium-albuterol (DUONEB) 0.5-2.5 (3) MG/3ML SOLN Take 3 mLs by nebulization every 6 (six) hours as needed (shortness of breath).   Past Week at Unknown time  . latanoprost (XALATAN) 0.005 % ophthalmic solution Place 1 drop into both eyes at bedtime.    02/09/2015 at Unknown time  . metoprolol tartrate (LOPRESSOR) 25 MG tablet Take 0.5 tablets (12.5 mg total) by mouth 2 (two) times daily. 30 tablet 0 02/10/2015 at Unknown time  . promethazine (PHENERGAN) 12.5 MG suppository Place 12.5 mg rectally every 6 (six) hours as needed for nausea or vomiting.   02/09/2015 at Unknown time  . sertraline (ZOLOFT) 50 MG tablet Take 50 mg by  mouth daily.   02/09/2015 at Unknown time  . warfarin (COUMADIN) 5 MG tablet Take 1 tablet (5 mg total) by mouth daily at 6 PM. (Patient taking differently: Take 4 mg by mouth daily at 6 PM. ) 30 tablet 0 02/09/2015 at Unknown time  . ZINC OXIDE EX Apply 1 application topically once a week. Infused wrap for legs   Past Week at Unknown time   Assessment: Okay for Protocol, elevated INR on admission.  Goal of Therapy:  INR 2-3   Plan:  No Warfarin today. Daily pt/inr. Monitor for signs and symptoms of bleeding.   Lamonte Richer R 02/10/2015,5:58 PM

## 2015-02-10 NOTE — ED Notes (Signed)
Pt sent from [facility] by Dr. Leanord Hawking for r/o appendicitis. N/v/d x 2 days. rlq tenderness and fever. Pt on coumadin for a-fib and recent DVT.

## 2015-02-11 ENCOUNTER — Encounter (HOSPITAL_COMMUNITY)
Admission: AD | Admit: 2015-02-11 | Payer: Medicare Other | Source: Skilled Nursing Facility | Admitting: Internal Medicine

## 2015-02-11 DIAGNOSIS — N210 Calculus in bladder: Secondary | ICD-10-CM | POA: Diagnosis not present

## 2015-02-11 DIAGNOSIS — I429 Cardiomyopathy, unspecified: Secondary | ICD-10-CM | POA: Diagnosis not present

## 2015-02-11 DIAGNOSIS — N179 Acute kidney failure, unspecified: Secondary | ICD-10-CM

## 2015-02-11 DIAGNOSIS — H409 Unspecified glaucoma: Secondary | ICD-10-CM | POA: Diagnosis not present

## 2015-02-11 DIAGNOSIS — I4891 Unspecified atrial fibrillation: Secondary | ICD-10-CM | POA: Diagnosis not present

## 2015-02-11 DIAGNOSIS — N139 Obstructive and reflux uropathy, unspecified: Secondary | ICD-10-CM

## 2015-02-11 DIAGNOSIS — N390 Urinary tract infection, site not specified: Secondary | ICD-10-CM | POA: Diagnosis not present

## 2015-02-11 DIAGNOSIS — M1990 Unspecified osteoarthritis, unspecified site: Secondary | ICD-10-CM | POA: Diagnosis not present

## 2015-02-11 DIAGNOSIS — Z7901 Long term (current) use of anticoagulants: Secondary | ICD-10-CM | POA: Diagnosis not present

## 2015-02-11 DIAGNOSIS — N401 Enlarged prostate with lower urinary tract symptoms: Secondary | ICD-10-CM | POA: Diagnosis not present

## 2015-02-11 DIAGNOSIS — I5030 Unspecified diastolic (congestive) heart failure: Secondary | ICD-10-CM | POA: Diagnosis not present

## 2015-02-11 DIAGNOSIS — N1330 Unspecified hydronephrosis: Secondary | ICD-10-CM | POA: Diagnosis not present

## 2015-02-11 DIAGNOSIS — J449 Chronic obstructive pulmonary disease, unspecified: Secondary | ICD-10-CM | POA: Diagnosis not present

## 2015-02-11 DIAGNOSIS — Z8249 Family history of ischemic heart disease and other diseases of the circulatory system: Secondary | ICD-10-CM | POA: Diagnosis not present

## 2015-02-11 DIAGNOSIS — R338 Other retention of urine: Secondary | ICD-10-CM | POA: Diagnosis not present

## 2015-02-11 DIAGNOSIS — Z87891 Personal history of nicotine dependence: Secondary | ICD-10-CM | POA: Diagnosis not present

## 2015-02-11 DIAGNOSIS — Z809 Family history of malignant neoplasm, unspecified: Secondary | ICD-10-CM | POA: Diagnosis not present

## 2015-02-11 DIAGNOSIS — R791 Abnormal coagulation profile: Secondary | ICD-10-CM | POA: Diagnosis present

## 2015-02-11 DIAGNOSIS — F0390 Unspecified dementia without behavioral disturbance: Secondary | ICD-10-CM | POA: Diagnosis not present

## 2015-02-11 LAB — CBC
HEMATOCRIT: 33.9 % — AB (ref 39.0–52.0)
HEMOGLOBIN: 11 g/dL — AB (ref 13.0–17.0)
MCH: 29 pg (ref 26.0–34.0)
MCHC: 32.4 g/dL (ref 30.0–36.0)
MCV: 89.4 fL (ref 78.0–100.0)
Platelets: 259 10*3/uL (ref 150–400)
RBC: 3.79 MIL/uL — ABNORMAL LOW (ref 4.22–5.81)
RDW: 16.2 % — ABNORMAL HIGH (ref 11.5–15.5)
WBC: 13.4 10*3/uL — ABNORMAL HIGH (ref 4.0–10.5)

## 2015-02-11 LAB — COMPREHENSIVE METABOLIC PANEL
ALBUMIN: 2.7 g/dL — AB (ref 3.5–5.0)
ALT: 18 U/L (ref 17–63)
ANION GAP: 7 (ref 5–15)
AST: 20 U/L (ref 15–41)
Alkaline Phosphatase: 64 U/L (ref 38–126)
BUN: 59 mg/dL — ABNORMAL HIGH (ref 6–20)
CO2: 27 mmol/L (ref 22–32)
Calcium: 8.5 mg/dL — ABNORMAL LOW (ref 8.9–10.3)
Chloride: 107 mmol/L (ref 101–111)
Creatinine, Ser: 2.68 mg/dL — ABNORMAL HIGH (ref 0.61–1.24)
GFR calc Af Amer: 24 mL/min — ABNORMAL LOW (ref >60)
GFR calc non Af Amer: 21 mL/min — ABNORMAL LOW (ref >60)
GLUCOSE: 112 mg/dL — AB (ref 65–99)
POTASSIUM: 4.7 mmol/L (ref 3.5–5.1)
SODIUM: 141 mmol/L (ref 135–145)
Total Bilirubin: 0.6 mg/dL (ref 0.3–1.2)
Total Protein: 6 g/dL — ABNORMAL LOW (ref 6.5–8.1)

## 2015-02-11 LAB — PROTIME-INR
INR: 8.3 (ref 0.00–1.49)
Prothrombin Time: 65.8 seconds — ABNORMAL HIGH (ref 11.6–15.2)

## 2015-02-11 NOTE — Care Management Note (Signed)
Case Management Note  Patient Details  Name: JABEZ MOLNER MRN: [id number] Date of Birth: Jul 12, 1931  Expected Discharge Date:   02/14/2015               Expected Discharge Plan:  Skilled Nursing Facility  In-House Referral:  Clinical Social Work  Discharge planning Services  CM Consult  Post Acute Care Choice:  NA Choice offered to:  NA  DME Arranged:    DME Agency:     HH Arranged:    HH Agency:     Status of Service:  Completed, signed off  Medicare Important Message Given:    Date Medicare IM Given:    Medicare IM give by:    Date Additional Medicare IM Given:    Additional Medicare Important Message give by:     If discussed at Long Length of Stay Meetings, dates discussed:    Additional Comments: Admitted with obstructive uropathy and AKI. Pt from [facility]. Pt plans to return to SNF at DC. CSW is aware and will work with pt on return to facility. No CM needs.  Malcolm Metro, RN 02/11/2015, 3:14 PM

## 2015-02-11 NOTE — Progress Notes (Signed)
[facility] PROGRESS NOTE  [name] ZOX:[id number] DOB: 15-Aug-1931 DOA: 02/10/2015 PCP: Frederica Kuster, MD  Assessment/Plan: 1. Obstructive uropathy. Hospitalized 11/2014 for UTI related to urinary retention and was discharged to facility with foley after failing voiding trial. He followed up with urology and still unable to void. CT reveals bladder calculus with bilateral hydroureteronephosis.   A new Foley catheter placed in ED with good drainage. will request urology consult. [name] He is due to see his urologist Dr. Annabell Howells on 02/15/2015. 2. Acute renal failure. Related to above. Creatinine trending down to 2.68 from 3.30 on admission. Continue foley. Will decrease IV rate given hx CHF. Hold nephrotoxins and monitor.  3. UTI. Await culture. Rocephin day #2. Leukocytosis improving. Afebrile and non-toxic appearing.  4. Dementia. This appears to be moderate to severe. Appears to be at baseline 5. History of congestive heart failure. Diastolic. Echo 09/2014 with EF 55% and grade 1 diastolic dysfunction. He remains compensated at the present time. Will continue gentle IV fluids and monitor closely.  6. History of atrial fibrillation. Patient is on warfarin for chronic anticoagulation therapy. INR remains supratherapeutic this am. No evidence of bleeding. Hg trending down slightly may be related to IV fluids.  7. COPD. Appears stable at baseline. Continue duonebs. Chest xray without acute changes  Code Status: full Family Communication: none presentation Disposition Plan: back to facility   Consultants:  urology  Procedures:  None   Antibiotics:  Rocephin 02/10/15>>  HPI/Subjective: Sitting up in bed. Denies pain. Eating breakfast  Objective: Filed Vitals:   02/11/15 0644  BP: 114/43  Pulse: 68  Temp: 97.6 F (36.4 C)  Resp: 20    Intake/Output Summary (Last 24 hours) at 02/11/15 0903 Last data filed at 02/11/15 0646  Gross per 24 hour  Intake      0 ml  Output    2400 ml  Net  -2400 ml   Filed Weights   02/10/15 1803  Weight: 141.84 kg (312 lb 11.2 oz)    Exam:   General:  Appears comfortfortable  Cardiovascular: RRR no MGR trace LE edema chronic venous stasis changed  Respiratory: normal effort BS coarse. End expiratory wheeze productive cough  Abdomen: obese soft +BS non-tender to palpation  Musculoskeletal: no clubbing/cyanosis   Data Reviewed: Basic Metabolic Panel:  Recent Labs Lab 02/10/15 1120 02/11/15 0627  NA 140 141  K 4.5 4.7  CL 108 107  CO2 25 27  GLUCOSE 213* 112*  BUN 47* 59*  CREATININE 3.30* 2.68*  CALCIUM 8.7* 8.5*   Liver Function Tests:  Recent Labs Lab 02/10/15 1120 02/11/15 0627  AST 23 20  ALT 18 18  ALKPHOS 74 64  BILITOT 1.3* 0.6  PROT 6.7 6.0*  ALBUMIN 2.9* 2.7*   No results for input(s): LIPASE, AMYLASE in the last 168 hours. No results for input(s): AMMONIA in the last 168 hours. CBC:  Recent Labs Lab 02/10/15 1120 02/11/15 0627  WBC 17.4* 13.4*  HGB 12.4* 11.0*  HCT 38.6* 33.9*  MCV 87.9 89.4  PLT 291 259   Cardiac Enzymes: No results for input(s): CKTOTAL, CKMB, CKMBINDEX, TROPONINI in the last 168 hours. BNP (last 3 results) No results for input(s): BNP in the last 8760 hours.  ProBNP (last 3 results) No results for input(s): PROBNP in the last 8760 hours.  CBG: No results for input(s): GLUCAP in the last 168 hours.  Recent Results (from the past 240 hour(s))  MRSA PCR Screening     Status:  None   Collection Time: 02/10/15  6:19 PM  Result Value Ref Range Status   MRSA by PCR NEGATIVE NEGATIVE Final    Comment:        The GeneXpert MRSA Assay (FDA approved for NASAL specimens only), is one component of a comprehensive MRSA colonization surveillance program. It is not intended to diagnose MRSA infection nor to guide or monitor treatment for MRSA infections.      Studies: Ct Abdomen Pelvis Wo Contrast  02/10/2015   CLINICAL DATA:  Fever, right lower  quadrant abdominal pain and diarrhea.  EXAM: CT ABDOMEN AND PELVIS WITHOUT CONTRAST  TECHNIQUE: Multidetector CT imaging of the abdomen and pelvis was performed following the standard protocol without IV contrast.  COMPARISON:  CT 12/22/2014  FINDINGS: Lower chest: Bibasilar bronchial wall thickening and areas of inspissated secretions noted. Trace pleural effusions noted with associated compressive atelectasis.  Hepatobiliary: Cholecystectomy clips are noted. Left hepatic lobe atrophy reidentified with a small amount of biliary gas reidentified. No intrahepatic ductal dilatation or common duct dilatation.  Pancreas: The pancreas is fatty replaced. No peripancreatic fluid collection.  Spleen: Normal  Adrenals/Urinary Tract: Adrenal glands are normal. Lobulated renal contour noted bilaterally with areas of too small to characterize hypodensity and other hypodense lesions which can be confidently determined to be cysts, largest of which in the right upper pole 7.6 cm image 40. There is bilateral moderate hydroureteronephrosis to the level of the ureterovesicular junctions. The bladder is distended, with a Foley catheter in place. Bladder calculus reidentified dependently. A small amount of perinephric fluid is identified bilaterally, tracking to the pelvis.  Stomach/Bowel: No bowel wall thickening or focal segmental dilatation is identified.  Vascular/Lymphatic: Infrarenal abdominal aortic aneurysm measuring 4.3 cm image 57 is unchanged. No periaortic fluid. No lymphadenopathy. Moderate atheromatous vascular calcifications.  Other: No free air.  Trace pelvic free fluid.  Musculoskeletal: Degenerative change at the spine noted.  IMPRESSION: Interval development of moderate bilateral hydroureteronephrosis with diffuse bladder dilatation and bladder calculus. There is no radiopaque calculus seen in either ureter. Findings raise the question of bladder outlet obstruction/Foley catheter obstruction, bladder mass  obstructing the bilateral ureterovesicular junctions but occult at noncontrast imaging, or less likely vesicoureteral reflux. Contrast-enhanced exam with delayed imaging through the renal collecting systems is recommended for further evaluation. If the patient cannot undergo that exam, then cystoscopy is recommended as well as bladder decompression.   Electronically Signed   By: Christiana Pellant M.D.   On: 02/10/2015 15:04   Dg Chest Portable 1 View  02/10/2015   CLINICAL DATA:  Right lower quadrant abdominal pain, cough and fever  EXAM: PORTABLE CHEST - 1 VIEW  COMPARISON:  01/11/2015  FINDINGS: Moderate enlargement of the cardiac silhouette is noted. No focal pulmonary opacity. Lungs are hypoaerated with crowding of the bronchovascular markings. Trace if any pleural fluid.  IMPRESSION: Suboptimal visualization due to patient positioning and portable technique. Cardiomegaly without focal acute finding. If symptoms persist, consider PA and lateral chest radiographs obtained at full inspiration when the patient is clinically able.   Electronically Signed   By: Christiana Pellant M.D.   On: 02/10/2015 12:20    Scheduled Meds: . cefTRIAXone (ROCEPHIN)  IV  1 g Intravenous Q24H  . diltiazem  120 mg Oral Daily  . doxazosin  8 mg Oral Daily  . feeding supplement (PRO-STAT SUGAR FREE 64)  30 mL Oral Daily  . finasteride  5 mg Oral QHS  . Influenza vac split quadrivalent PF  0.5  mL Intramuscular Tomorrow-1000  . latanoprost  1 drop Both Eyes QHS  . metoprolol tartrate  12.5 mg Oral BID  . pneumococcal 23 valent vaccine  0.5 mL Intramuscular Tomorrow-1000  . sertraline  50 mg Oral Daily  . Warfarin - Pharmacist Dosing Inpatient   Does not apply Q24H  . Zinc Oxide   Topical Weekly   Continuous Infusions: . sodium chloride 125 mL/hr at 02/10/15 1824    Principal Problem:   Obstructive uropathy Active Problems:   COPD (chronic obstructive pulmonary disease)   Cognitive impairment   CHF (congestive heart  failure)   UTI (urinary tract infection)   Cardiomyopathy   Benign localized hyperplasia of prostate with urinary retention   Bladder calculus   Acute renal failure    Time spent: 35 minutes    [facility] M  [facility] Pager [phone]. If 7PM-7AM, please contact night-coverage at [email or web address], password [facility] 02/11/2015, 9:03 AM  LOS: 1 day

## 2015-02-11 NOTE — Clinical Social Work Note (Signed)
Clinical Social Work Assessment  Patient Details  Name: [name] MRN: [id number] Date of Birth: [birth year]  Date of referral:  02/11/15               Reason for consult:  Facility Placement                Permission sought to share information with:    Permission granted to share information::     Name::        Agency::     Relationship::     Contact Information:     Housing/Transportation Living arrangements for the past 2 months:  Skilled Holiday representative, Assisted Living Facility Source of Information:  Adult Children Patient Interpreter Needed:  None Criminal Activity/Legal Involvement Pertinent to Current Situation/Hospitalization:  No - Comment as needed Significant Relationships:  Adult Children Lives with:  Facility Resident Do you feel safe going back to the place where you live?  Yes Need for family participation in patient care:  Yes (Comment)  Care giving concerns:  Pt resident at [facility].    Social Worker assessment / plan:  CSW attempted to meet with pt at bedside, but pt sleeping. CSW spoke with pt's daughter, [name] on phone. [name] reports pt has been a resident at [facility] for about 2 months now. [name] is involved and supportive and visits often. He completed rehab and went to ALF unit just last week. Per [facility], they believe pt will need to return SNF and have discussed this with [name] who agrees. [name] indicates things have been going well at [facility]. She is aware of outstanding bill that is due before his return and states she will take care of it. Per Lynnea Ferrier at facility, pt is max assist with transfers and does not ambulate. Okay for return.   Employment status:  Retired Database administrator PT Recommendations:  Not assessed at this time Information / Referral to community resources:  Skilled Nursing Facility  Patient/Family's Response to care:  Pt's daughter requesting return to [facility] and has already discussed need for SNF as opposed to return to ALF unit.    Patient/Family's Understanding of and Emotional Response to Diagnosis, Current Treatment, and Prognosis:  Daughter appears to be aware of admission diagnosis and treatment plan. She plans to follow up with RN/MD later today to discuss further.   Emotional Assessment Appearance:  Appears stated age Attitude/Demeanor/Rapport:  Unable to Assess Affect (typically observed):  Unable to Assess Orientation:    Alcohol / Substance use:  Not Applicable Psych involvement (Current and /or in the community):  No (Comment)  Discharge Needs  Concerns to be addressed:  Discharge Planning Concerns Readmission within the last 30 days:  No Current discharge risk:  None Barriers to Discharge:  Continued Medical Work up   [employer], [facility], LCSW 02/11/2015, 11:10 AM [phone]

## 2015-02-11 NOTE — Progress Notes (Signed)
Critical result PT/INR 65.8/8.30 called to desk.  Hernandez informed.

## 2015-02-11 NOTE — Progress Notes (Signed)
ANTICOAGULATION CONSULT NOTE - follow up  Pharmacy Consult for Warfarin Indication: atrial fibrillation  Allergies  Allergen Reactions  . Oxycodone Hcl Other (See Comments)     makes pt "wild"  . Propoxyphene N-Acetaminophen Other (See Comments)     Makes pt. "wild"   Patient Measurements: Height: 6' (182.9 cm) Weight: (!) 312 lb 11.2 oz (141.84 kg) IBW/kg (Calculated) : 77.6  Vital Signs: Temp: 97.6 F (36.4 C) (09/12 0644) Temp Source: Oral (09/12 0644) BP: 114/43 mmHg (09/12 0644) Pulse Rate: 68 (09/12 0644)  Labs:  Recent Labs  02/10/15 1120 02/11/15 0627  HGB 12.4* 11.0*  HCT 38.6* 33.9*  PLT 291 259  LABPROT 49.2* 65.8*  INR 5.64* 8.30*  CREATININE 3.30* 2.68*    Estimated Creatinine Clearance: 31.6 mL/min (by C-G formula based on Cr of 2.68).  Medications:  Prescriptions prior to admission  Medication Sig Dispense Refill Last Dose  . acetaminophen (TYLENOL) 500 MG tablet Take 1,000 mg by mouth every 6 (six) hours as needed for mild pain or fever.    02/10/2015 at Unknown time  . Amino Acids-Protein Hydrolys (FEEDING SUPPLEMENT, PRO-STAT SUGAR FREE 64,) LIQD Take 30 mLs by mouth daily.   02/09/2015 at Unknown time  . diltiazem (CARDIZEM CD) 120 MG 24 hr capsule Take 120 mg by mouth daily.    02/10/2015 at Unknown time  . doxazosin (CARDURA) 8 MG tablet Take 8 mg by mouth daily.   02/10/2015 at Unknown time  . finasteride (PROSCAR) 5 MG tablet Take 5 mg by mouth at bedtime.    02/09/2015 at Unknown time  . ipratropium-albuterol (DUONEB) 0.5-2.5 (3) MG/3ML SOLN Take 3 mLs by nebulization every 6 (six) hours as needed (shortness of breath).   Past Week at Unknown time  . latanoprost (XALATAN) 0.005 % ophthalmic solution Place 1 drop into both eyes at bedtime.    02/09/2015 at Unknown time  . metoprolol tartrate (LOPRESSOR) 25 MG tablet Take 0.5 tablets (12.5 mg total) by mouth 2 (two) times daily. 30 tablet 0 02/10/2015 at Unknown time  . promethazine (PHENERGAN) 12.5  MG suppository Place 12.5 mg rectally every 6 (six) hours as needed for nausea or vomiting.   02/09/2015 at Unknown time  . sertraline (ZOLOFT) 50 MG tablet Take 50 mg by mouth daily.   02/09/2015 at Unknown time  . warfarin (COUMADIN) 5 MG tablet Take 1 tablet (5 mg total) by mouth daily at 6 PM. (Patient taking differently: Take 4 mg by mouth daily at 6 PM. ) 30 tablet 0 02/09/2015 at Unknown time  . ZINC OXIDE EX Apply 1 application topically once a week. Infused wrap for legs   Past Week at Unknown time   Assessment: 81yo male.  INR elevated.  Dr Ardyth Harps aware.    Goal of Therapy:  INR 2-3   Plan:  No Warfarin today. Daily pt/inr. Monitor for signs and symptoms of bleeding.   Valrie Hart A 02/11/2015,10:02 AM

## 2015-02-12 DIAGNOSIS — R339 Retention of urine, unspecified: Secondary | ICD-10-CM | POA: Diagnosis not present

## 2015-02-12 DIAGNOSIS — N390 Urinary tract infection, site not specified: Secondary | ICD-10-CM

## 2015-02-12 DIAGNOSIS — N1330 Unspecified hydronephrosis: Secondary | ICD-10-CM | POA: Diagnosis not present

## 2015-02-12 DIAGNOSIS — R1031 Right lower quadrant pain: Secondary | ICD-10-CM | POA: Diagnosis not present

## 2015-02-12 LAB — BASIC METABOLIC PANEL
Anion gap: 6 (ref 5–15)
BUN: 55 mg/dL — ABNORMAL HIGH (ref 6–20)
CALCIUM: 8.3 mg/dL — AB (ref 8.9–10.3)
CO2: 25 mmol/L (ref 22–32)
CREATININE: 2.09 mg/dL — AB (ref 0.61–1.24)
Chloride: 110 mmol/L (ref 101–111)
GFR calc non Af Amer: 28 mL/min — ABNORMAL LOW (ref >60)
GFR, EST AFRICAN AMERICAN: 33 mL/min — AB (ref >60)
GLUCOSE: 105 mg/dL — AB (ref 65–99)
Potassium: 3.5 mmol/L (ref 3.5–5.1)
Sodium: 141 mmol/L (ref 135–145)

## 2015-02-12 LAB — PROTIME-INR
INR: 5.46 — AB (ref 0.00–1.49)
Prothrombin Time: 47.8 seconds — ABNORMAL HIGH (ref 11.6–15.2)

## 2015-02-12 LAB — CBC
HEMATOCRIT: 31.4 % — AB (ref 39.0–52.0)
Hemoglobin: 10.4 g/dL — ABNORMAL LOW (ref 13.0–17.0)
MCH: 28.8 pg (ref 26.0–34.0)
MCHC: 33.1 g/dL (ref 30.0–36.0)
MCV: 87 fL (ref 78.0–100.0)
Platelets: 259 10*3/uL (ref 150–400)
RBC: 3.61 MIL/uL — ABNORMAL LOW (ref 4.22–5.81)
RDW: 16.1 % — AB (ref 11.5–15.5)
WBC: 12.3 10*3/uL — ABNORMAL HIGH (ref 4.0–10.5)

## 2015-02-12 LAB — URINE CULTURE: Culture: NO GROWTH

## 2015-02-12 MED ORDER — SODIUM CHLORIDE 0.9 % IV BOLUS (SEPSIS)
250.0000 mL | Freq: Once | INTRAVENOUS | Status: AC
Start: 2015-02-12 — End: 2015-02-12
  Administered 2015-02-12: 250 mL via INTRAVENOUS

## 2015-02-12 NOTE — Progress Notes (Signed)
[facility] PROGRESS NOTE  [name] ZOX:[id number] DOB: 06-Apr-1932 DOA: 02/10/2015 PCP: Frederica Kuster, MD  Assessment/Plan: 1. Obstructive uropathy. CT revealed bladder calculus with bilateral hydroureteronephosis. Urine draining well from foley. Evaluated by urology who recommend keeping foley and will follow up 9/16 either inpatient or OP as previously scheduled. 2. Acute renal failure. Related to above. Creatinine  trending down to 2.08 from 3.30 on admission. Continue foley. Will continue IV fluids with 250 bolus.  Hold nephrotoxins and monitor.  3. UTI. Await culture. Rocephin day #3. Leukocytosis continues to improve.  Remains afebrile and non-toxic appearing. monitor 4. Dementia. This appears to be moderate to severe and at baseline. Oriented to self only. Able to make needs and wants known. 5. History of congestive heart failure. Diastolic. Echo 09/2014 with EF 55% and grade 1 diastolic dysfunction. He remains compensated at the present time. Will continue gentle IV fluids and monitor closely.  6. History of atrial fibrillation. Patient is on warfarin for chronic anticoagulation therapy. INR remains supratherapeutic but trending downward. No evidence of bleeding. Hg trending down slightly may be related to IV fluids. Holding warfarin and monitor. Recheck in am.  7. COPD. Appears stable at baseline. Continue duonebs. Chest xray without acute changes 8. Elevated INR. Trending down. See #6. No s/sx bleeding.    Code Status: full Family Communication: none present Disposition Plan: back to facility hopefully tomorrow   Consultants:  urology  Procedures:  foley  Antibiotics:  Rocephin 02/10/15>>  HPI/Subjective: Sitting up in bed eating breakfast. Denies any pain/discomfort  Objective: Filed Vitals:   02/12/15 0527  BP: 105/74  Pulse: 81  Temp: 99 F (37.2 C)  Resp: 20    Intake/Output Summary (Last 24 hours) at 02/12/15 0847 Last data filed at 02/12/15  0657  Gross per 24 hour  Intake   1150 ml  Output    700 ml  Net    450 ml   Filed Weights   02/10/15 1803  Weight: 141.84 kg (312 lb 11.2 oz)    Exam:   General:  Well nourished appears comfortable  Cardiovascular: RRR no MGR no LE edema, chronic venous stasis changes with healing blisters  Respiratory: normal effort BS somewhat coarse with some wheezing throughout. No crackles  Abdomen: non-distended non-tender +BS no guarding or rebounding  Musculoskeletal: joints without swelling/erythema   Data Reviewed: Basic Metabolic Panel:  Recent Labs Lab 02/10/15 1120 02/11/15 0627 02/12/15 0611  NA 140 141 141  K 4.5 4.7 3.5  CL 108 107 110  CO2 25 27 25   GLUCOSE 213* 112* 105*  BUN 47* 59* 55*  CREATININE 3.30* 2.68* 2.09*  CALCIUM 8.7* 8.5* 8.3*   Liver Function Tests:  Recent Labs Lab 02/10/15 1120 02/11/15 0627  AST 23 20  ALT 18 18  ALKPHOS 74 64  BILITOT 1.3* 0.6  PROT 6.7 6.0*  ALBUMIN 2.9* 2.7*   No results for input(s): LIPASE, AMYLASE in the last 168 hours. No results for input(s): AMMONIA in the last 168 hours. CBC:  Recent Labs Lab 02/10/15 1120 02/11/15 0627 02/12/15 0611  WBC 17.4* 13.4* 12.3*  HGB 12.4* 11.0* 10.4*  HCT 38.6* 33.9* 31.4*  MCV 87.9 89.4 87.0  PLT 291 259 259   Cardiac Enzymes: No results for input(s): CKTOTAL, CKMB, CKMBINDEX, TROPONINI in the last 168 hours. BNP (last 3 results) No results for input(s): BNP in the last 8760 hours.  ProBNP (last 3 results) No results for input(s): PROBNP in the last 8760  hours.  CBG: No results for input(s): GLUCAP in the last 168 hours.  Recent Results (from the past 240 hour(s))  Urine culture     Status: None (Preliminary result)   Collection Time: 02/10/15 11:13 AM  Result Value Ref Range Status   Specimen Description URINE, CATHETERIZED  Final   Special Requests NONE  Final   Culture   Final    NO GROWTH < 24 HOURS Performed at [facility]    Report Status  PENDING  Incomplete  MRSA PCR Screening     Status: None   Collection Time: 02/10/15  6:19 PM  Result Value Ref Range Status   MRSA by PCR NEGATIVE NEGATIVE Final    Comment:        The GeneXpert MRSA Assay (FDA approved for NASAL specimens only), is one component of a comprehensive MRSA colonization surveillance program. It is not intended to diagnose MRSA infection nor to guide or monitor treatment for MRSA infections.      Studies: Ct Abdomen Pelvis Wo Contrast  02/10/2015   CLINICAL DATA:  Fever, right lower quadrant abdominal pain and diarrhea.  EXAM: CT ABDOMEN AND PELVIS WITHOUT CONTRAST  TECHNIQUE: Multidetector CT imaging of the abdomen and pelvis was performed following the standard protocol without IV contrast.  COMPARISON:  CT 12/22/2014  FINDINGS: Lower chest: Bibasilar bronchial wall thickening and areas of inspissated secretions noted. Trace pleural effusions noted with associated compressive atelectasis.  Hepatobiliary: Cholecystectomy clips are noted. Left hepatic lobe atrophy reidentified with a small amount of biliary gas reidentified. No intrahepatic ductal dilatation or common duct dilatation.  Pancreas: The pancreas is fatty replaced. No peripancreatic fluid collection.  Spleen: Normal  Adrenals/Urinary Tract: Adrenal glands are normal. Lobulated renal contour noted bilaterally with areas of too small to characterize hypodensity and other hypodense lesions which can be confidently determined to be cysts, largest of which in the right upper pole 7.6 cm image 40. There is bilateral moderate hydroureteronephrosis to the level of the ureterovesicular junctions. The bladder is distended, with a Foley catheter in place. Bladder calculus reidentified dependently. A small amount of perinephric fluid is identified bilaterally, tracking to the pelvis.  Stomach/Bowel: No bowel wall thickening or focal segmental dilatation is identified.  Vascular/Lymphatic: Infrarenal abdominal aortic  aneurysm measuring 4.3 cm image 57 is unchanged. No periaortic fluid. No lymphadenopathy. Moderate atheromatous vascular calcifications.  Other: No free air.  Trace pelvic free fluid.  Musculoskeletal: Degenerative change at the spine noted.  IMPRESSION: Interval development of moderate bilateral hydroureteronephrosis with diffuse bladder dilatation and bladder calculus. There is no radiopaque calculus seen in either ureter. Findings raise the question of bladder outlet obstruction/Foley catheter obstruction, bladder mass obstructing the bilateral ureterovesicular junctions but occult at noncontrast imaging, or less likely vesicoureteral reflux. Contrast-enhanced exam with delayed imaging through the renal collecting systems is recommended for further evaluation. If the patient cannot undergo that exam, then cystoscopy is recommended as well as bladder decompression.   Electronically Signed   By: Christiana Pellant M.D.   On: 02/10/2015 15:04   Dg Chest Portable 1 View  02/10/2015   CLINICAL DATA:  Right lower quadrant abdominal pain, cough and fever  EXAM: PORTABLE CHEST - 1 VIEW  COMPARISON:  01/11/2015  FINDINGS: Moderate enlargement of the cardiac silhouette is noted. No focal pulmonary opacity. Lungs are hypoaerated with crowding of the bronchovascular markings. Trace if any pleural fluid.  IMPRESSION: Suboptimal visualization due to patient positioning and portable technique. Cardiomegaly without focal acute finding.  If symptoms persist, consider PA and lateral chest radiographs obtained at full inspiration when the patient is clinically able.   Electronically Signed   By: Christiana Pellant M.D.   On: 02/10/2015 12:20    Scheduled Meds: . cefTRIAXone (ROCEPHIN)  IV  1 g Intravenous Q24H  . diltiazem  120 mg Oral Daily  . doxazosin  8 mg Oral Daily  . feeding supplement (PRO-STAT SUGAR FREE 64)  30 mL Oral Daily  . finasteride  5 mg Oral QHS  . latanoprost  1 drop Both Eyes QHS  . metoprolol tartrate   12.5 mg Oral BID  . sertraline  50 mg Oral Daily  . sodium chloride  250 mL Intravenous Once  . Warfarin - Pharmacist Dosing Inpatient   Does not apply Q24H  . Zinc Oxide   Topical Weekly   Continuous Infusions: . sodium chloride 75 mL/hr at 02/11/15 1018    Principal Problem:   Obstructive uropathy Active Problems:   COPD (chronic obstructive pulmonary disease)   Cognitive impairment   CHF (congestive heart failure)   UTI (urinary tract infection)   Cardiomyopathy   Benign localized hyperplasia of prostate with urinary retention   Bladder calculus   Acute renal failure   Elevated INR   Hydroureteronephrosis    Time spent: 30 minutes    [facility] M  [facility] Pager [phone]. If 7PM-7AM, please contact night-coverage at [email or web address], password [facility] 02/12/2015, 8:47 AM  LOS: 2 days

## 2015-02-12 NOTE — Progress Notes (Signed)
Subjective: Patient reports no pain. No bother from catheter. Eating breakfast.  Objective: Vital signs in last 24 hours: Temp:  [99 F (37.2 C)-100 F (37.8 C)] 99 F (37.2 C) (09/13 0527) Pulse Rate:  [81-92] 81 (09/13 0527) Resp:  [20] 20 (09/13 0527) BP: (105-144)/(53-74) 105/74 mmHg (09/13 0527) SpO2:  [95 %-98 %] 98 % (09/13 0527)  Intake/Output from previous day: 09/12 0701 - 09/13 0700 In: 1150 [P.O.:480; I.V.:670] Out: 700 [Urine:700] Intake/Output this shift:    Physical Exam:  Constitutional: Vital signs reviewed. WD WN appears comfortable. Eyes: PERRL, No scleral icterus.   Pulmonary/Chest: Normal effort   Lab Results:  Recent Labs  02/10/15 1120 02/11/15 0627 02/12/15 0611  HGB 12.4* 11.0* 10.4*  HCT 38.6* 33.9* 31.4*   BMET  Recent Labs  02/11/15 0627 02/12/15 0611  NA 141 141  K 4.7 3.5  CL 107 110  CO2 27 25  GLUCOSE 112* 105*  BUN 59* 55*  CREATININE 2.68* 2.09*  CALCIUM 8.5* 8.3*    Recent Labs  02/10/15 1120 02/11/15 0627 02/12/15 0611  INR 5.64* 8.30* 5.46*   No results for input(s): LABURIN in the last 72 hours. Results for orders placed or performed during the hospital encounter of 02/10/15  Urine culture     Status: None (Preliminary result)   Collection Time: 02/10/15 11:13 AM  Result Value Ref Range Status   Specimen Description URINE, CATHETERIZED  Final   Special Requests NONE  Final   Culture   Final    NO GROWTH < 24 HOURS Performed at [facility]    Report Status PENDING  Incomplete  MRSA PCR Screening     Status: None   Collection Time: 02/10/15  6:19 PM  Result Value Ref Range Status   MRSA by PCR NEGATIVE NEGATIVE Final    Comment:        The GeneXpert MRSA Assay (FDA approved for NASAL specimens only), is one component of a comprehensive MRSA colonization surveillance program. It is not intended to diagnose MRSA infection nor to guide or monitor treatment for MRSA infections.      Studies/Results: Ct Abdomen Pelvis Wo Contrast  02/10/2015   CLINICAL DATA:  Fever, right lower quadrant abdominal pain and diarrhea.  EXAM: CT ABDOMEN AND PELVIS WITHOUT CONTRAST  TECHNIQUE: Multidetector CT imaging of the abdomen and pelvis was performed following the standard protocol without IV contrast.  COMPARISON:  CT 12/22/2014  FINDINGS: Lower chest: Bibasilar bronchial wall thickening and areas of inspissated secretions noted. Trace pleural effusions noted with associated compressive atelectasis.  Hepatobiliary: Cholecystectomy clips are noted. Left hepatic lobe atrophy reidentified with a small amount of biliary gas reidentified. No intrahepatic ductal dilatation or common duct dilatation.  Pancreas: The pancreas is fatty replaced. No peripancreatic fluid collection.  Spleen: Normal  Adrenals/Urinary Tract: Adrenal glands are normal. Lobulated renal contour noted bilaterally with areas of too small to characterize hypodensity and other hypodense lesions which can be confidently determined to be cysts, largest of which in the right upper pole 7.6 cm image 40. There is bilateral moderate hydroureteronephrosis to the level of the ureterovesicular junctions. The bladder is distended, with a Foley catheter in place. Bladder calculus reidentified dependently. A small amount of perinephric fluid is identified bilaterally, tracking to the pelvis.  Stomach/Bowel: No bowel wall thickening or focal segmental dilatation is identified.  Vascular/Lymphatic: Infrarenal abdominal aortic aneurysm measuring 4.3 cm image 57 is unchanged. No periaortic fluid. No lymphadenopathy. Moderate atheromatous vascular calcifications.  Other:  No free air.  Trace pelvic free fluid.  Musculoskeletal: Degenerative change at the spine noted.  IMPRESSION: Interval development of moderate bilateral hydroureteronephrosis with diffuse bladder dilatation and bladder calculus. There is no radiopaque calculus seen in either ureter.  Findings raise the question of bladder outlet obstruction/Foley catheter obstruction, bladder mass obstructing the bilateral ureterovesicular junctions but occult at noncontrast imaging, or less likely vesicoureteral reflux. Contrast-enhanced exam with delayed imaging through the renal collecting systems is recommended for further evaluation. If the patient cannot undergo that exam, then cystoscopy is recommended as well as bladder decompression.   Electronically Signed   By: Christiana Pellant M.D.   On: 02/10/2015 15:04   Dg Chest Portable 1 View  02/10/2015   CLINICAL DATA:  Right lower quadrant abdominal pain, cough and fever  EXAM: PORTABLE CHEST - 1 VIEW  COMPARISON:  01/11/2015  FINDINGS: Moderate enlargement of the cardiac silhouette is noted. No focal pulmonary opacity. Lungs are hypoaerated with crowding of the bronchovascular markings. Trace if any pleural fluid.  IMPRESSION: Suboptimal visualization due to patient positioning and portable technique. Cardiomegaly without focal acute finding. If symptoms persist, consider PA and lateral chest radiographs obtained at full inspiration when the patient is clinically able.   Electronically Signed   By: Christiana Pellant M.D.   On: 02/10/2015 12:20    Assessment/Plan:   Urinary retention/bladder calculus w/ foley drainage. Recent admission for inadequate drainage w/ UTI/bilateral hydro--now w/ resolving ARF.  Leave catheter in--followup w/ Dr Annabell Howells Friday as scheduled--if still an inpt then, will have pt seen in hospital.   LOS: 2 days   Marcine Matar M 02/12/2015, 8:16 AM

## 2015-02-12 NOTE — Progress Notes (Signed)
Received phone call from central telemetry, patient fluctuating from NSR 80's to PVC's, trigeminy, while talking to her patient back into NSR.  Wife at bedside, patient was active but now resting with eyes closed.

## 2015-02-12 NOTE — Progress Notes (Signed)
ANTICOAGULATION CONSULT NOTE - follow up  Pharmacy Consult for Warfarin Indication: atrial fibrillation  Allergies  Allergen Reactions  . Oxycodone Hcl Other (See Comments)     makes pt "wild"  . Propoxyphene N-Acetaminophen Other (See Comments)     Makes pt. "wild"   Patient Measurements: Height: 6' (182.9 cm) Weight: (!) 312 lb 11.2 oz (141.84 kg) IBW/kg (Calculated) : 77.6  Vital Signs: Temp: 99 F (37.2 C) (09/13 0527) Temp Source: Oral (09/13 0527) BP: 105/74 mmHg (09/13 0527) Pulse Rate: 81 (09/13 0527)  Labs:  Recent Labs  02/10/15 1120 02/11/15 0627 02/12/15 0611  HGB 12.4* 11.0* 10.4*  HCT 38.6* 33.9* 31.4*  PLT 291 259 259  LABPROT 49.2* 65.8* 47.8*  INR 5.64* 8.30* 5.46*  CREATININE 3.30* 2.68* 2.09*    Estimated Creatinine Clearance: 40.5 mL/min (by C-G formula based on Cr of 2.09).  Medications:  Prescriptions prior to admission  Medication Sig Dispense Refill Last Dose  . acetaminophen (TYLENOL) 500 MG tablet Take 1,000 mg by mouth every 6 (six) hours as needed for mild pain or fever.    02/10/2015 at Unknown time  . Amino Acids-Protein Hydrolys (FEEDING SUPPLEMENT, PRO-STAT SUGAR FREE 64,) LIQD Take 30 mLs by mouth daily.   02/09/2015 at Unknown time  . diltiazem (CARDIZEM CD) 120 MG 24 hr capsule Take 120 mg by mouth daily.    02/10/2015 at Unknown time  . doxazosin (CARDURA) 8 MG tablet Take 8 mg by mouth daily.   02/10/2015 at Unknown time  . finasteride (PROSCAR) 5 MG tablet Take 5 mg by mouth at bedtime.    02/09/2015 at Unknown time  . ipratropium-albuterol (DUONEB) 0.5-2.5 (3) MG/3ML SOLN Take 3 mLs by nebulization every 6 (six) hours as needed (shortness of breath).   Past Week at Unknown time  . latanoprost (XALATAN) 0.005 % ophthalmic solution Place 1 drop into both eyes at bedtime.    02/09/2015 at Unknown time  . metoprolol tartrate (LOPRESSOR) 25 MG tablet Take 0.5 tablets (12.5 mg total) by mouth 2 (two) times daily. 30 tablet 0 02/10/2015 at  Unknown time  . promethazine (PHENERGAN) 12.5 MG suppository Place 12.5 mg rectally every 6 (six) hours as needed for nausea or vomiting.   02/09/2015 at Unknown time  . sertraline (ZOLOFT) 50 MG tablet Take 50 mg by mouth daily.   02/09/2015 at Unknown time  . warfarin (COUMADIN) 5 MG tablet Take 1 tablet (5 mg total) by mouth daily at 6 PM. (Patient taking differently: Take 4 mg by mouth daily at 6 PM. ) 30 tablet 0 02/09/2015 at Unknown time  . ZINC OXIDE EX Apply 1 application topically once a week. Infused wrap for legs   Past Week at Unknown time   Assessment: 81yo male on coumadin for afib.  INR remains elevated today but is trending down.  No bleeding complications noted.  Goal of Therapy:  INR 2-3   Plan:  No Warfarin today. Daily pt/inr. Monitor for signs and symptoms of bleeding.   Talbert Cage Poteet 02/12/2015,8:19 AM

## 2015-02-13 ENCOUNTER — Inpatient Hospital Stay
Admission: RE | Admit: 2015-02-13 | Discharge: 2015-03-28 | Disposition: A | Discharge status: Nursing Home (Includes ICF) | Payer: Medicare Other | Source: Ambulatory Visit | Attending: Internal Medicine | Admitting: Internal Medicine

## 2015-02-13 DIAGNOSIS — R339 Retention of urine, unspecified: Secondary | ICD-10-CM | POA: Diagnosis not present

## 2015-02-13 DIAGNOSIS — I714 Abdominal aortic aneurysm, without rupture: Secondary | ICD-10-CM | POA: Diagnosis not present

## 2015-02-13 DIAGNOSIS — L03116 Cellulitis of left lower limb: Secondary | ICD-10-CM | POA: Diagnosis not present

## 2015-02-13 DIAGNOSIS — I5032 Chronic diastolic (congestive) heart failure: Secondary | ICD-10-CM | POA: Diagnosis not present

## 2015-02-13 DIAGNOSIS — D689 Coagulation defect, unspecified: Secondary | ICD-10-CM | POA: Diagnosis not present

## 2015-02-13 DIAGNOSIS — I429 Cardiomyopathy, unspecified: Secondary | ICD-10-CM | POA: Diagnosis not present

## 2015-02-13 DIAGNOSIS — Z86718 Personal history of other venous thrombosis and embolism: Secondary | ICD-10-CM | POA: Diagnosis not present

## 2015-02-13 DIAGNOSIS — J432 Centrilobular emphysema: Secondary | ICD-10-CM

## 2015-02-13 DIAGNOSIS — I739 Peripheral vascular disease, unspecified: Secondary | ICD-10-CM | POA: Diagnosis not present

## 2015-02-13 DIAGNOSIS — R338 Other retention of urine: Secondary | ICD-10-CM | POA: Diagnosis not present

## 2015-02-13 DIAGNOSIS — N138 Other obstructive and reflux uropathy: Secondary | ICD-10-CM | POA: Diagnosis not present

## 2015-02-13 DIAGNOSIS — N139 Obstructive and reflux uropathy, unspecified: Secondary | ICD-10-CM | POA: Diagnosis not present

## 2015-02-13 DIAGNOSIS — I5040 Unspecified combined systolic (congestive) and diastolic (congestive) heart failure: Secondary | ICD-10-CM | POA: Diagnosis not present

## 2015-02-13 DIAGNOSIS — N210 Calculus in bladder: Secondary | ICD-10-CM

## 2015-02-13 DIAGNOSIS — M1990 Unspecified osteoarthritis, unspecified site: Secondary | ICD-10-CM | POA: Diagnosis not present

## 2015-02-13 DIAGNOSIS — H401134 Primary open-angle glaucoma, bilateral, indeterminate stage: Secondary | ICD-10-CM | POA: Diagnosis not present

## 2015-02-13 DIAGNOSIS — I482 Chronic atrial fibrillation: Secondary | ICD-10-CM | POA: Diagnosis not present

## 2015-02-13 DIAGNOSIS — N401 Enlarged prostate with lower urinary tract symptoms: Secondary | ICD-10-CM

## 2015-02-13 DIAGNOSIS — Z79899 Other long term (current) drug therapy: Secondary | ICD-10-CM | POA: Diagnosis not present

## 2015-02-13 DIAGNOSIS — R627 Adult failure to thrive: Secondary | ICD-10-CM | POA: Diagnosis not present

## 2015-02-13 DIAGNOSIS — Z961 Presence of intraocular lens: Secondary | ICD-10-CM | POA: Diagnosis not present

## 2015-02-13 DIAGNOSIS — R410 Disorientation, unspecified: Secondary | ICD-10-CM | POA: Diagnosis not present

## 2015-02-13 DIAGNOSIS — R791 Abnormal coagulation profile: Secondary | ICD-10-CM

## 2015-02-13 DIAGNOSIS — Z9181 History of falling: Secondary | ICD-10-CM | POA: Diagnosis not present

## 2015-02-13 DIAGNOSIS — Z6834 Body mass index (BMI) 34.0-34.9, adult: Secondary | ICD-10-CM | POA: Diagnosis not present

## 2015-02-13 DIAGNOSIS — Z9049 Acquired absence of other specified parts of digestive tract: Secondary | ICD-10-CM | POA: Diagnosis not present

## 2015-02-13 DIAGNOSIS — J449 Chronic obstructive pulmonary disease, unspecified: Secondary | ICD-10-CM | POA: Diagnosis not present

## 2015-02-13 DIAGNOSIS — H409 Unspecified glaucoma: Secondary | ICD-10-CM | POA: Diagnosis not present

## 2015-02-13 DIAGNOSIS — E669 Obesity, unspecified: Secondary | ICD-10-CM | POA: Diagnosis not present

## 2015-02-13 DIAGNOSIS — I4891 Unspecified atrial fibrillation: Secondary | ICD-10-CM | POA: Diagnosis not present

## 2015-02-13 DIAGNOSIS — N179 Acute kidney failure, unspecified: Secondary | ICD-10-CM | POA: Diagnosis not present

## 2015-02-13 DIAGNOSIS — N131 Hydronephrosis with ureteral stricture, not elsewhere classified: Secondary | ICD-10-CM

## 2015-02-13 DIAGNOSIS — N400 Enlarged prostate without lower urinary tract symptoms: Secondary | ICD-10-CM | POA: Diagnosis not present

## 2015-02-13 DIAGNOSIS — I480 Paroxysmal atrial fibrillation: Secondary | ICD-10-CM | POA: Diagnosis not present

## 2015-02-13 DIAGNOSIS — J438 Other emphysema: Secondary | ICD-10-CM | POA: Diagnosis not present

## 2015-02-13 DIAGNOSIS — F17290 Nicotine dependence, other tobacco product, uncomplicated: Secondary | ICD-10-CM | POA: Diagnosis not present

## 2015-02-13 DIAGNOSIS — F0390 Unspecified dementia without behavioral disturbance: Secondary | ICD-10-CM | POA: Diagnosis not present

## 2015-02-13 DIAGNOSIS — Z7901 Long term (current) use of anticoagulants: Secondary | ICD-10-CM | POA: Diagnosis not present

## 2015-02-13 LAB — CBC
HEMATOCRIT: 31.6 % — AB (ref 39.0–52.0)
Hemoglobin: 10.1 g/dL — ABNORMAL LOW (ref 13.0–17.0)
MCH: 27.8 pg (ref 26.0–34.0)
MCHC: 32 g/dL (ref 30.0–36.0)
MCV: 87.1 fL (ref 78.0–100.0)
Platelets: 252 10*3/uL (ref 150–400)
RBC: 3.63 MIL/uL — ABNORMAL LOW (ref 4.22–5.81)
RDW: 16.1 % — AB (ref 11.5–15.5)
WBC: 10 10*3/uL (ref 4.0–10.5)

## 2015-02-13 LAB — BASIC METABOLIC PANEL
Anion gap: 7 (ref 5–15)
BUN: 44 mg/dL — AB (ref 6–20)
CALCIUM: 8.5 mg/dL — AB (ref 8.9–10.3)
CO2: 24 mmol/L (ref 22–32)
CREATININE: 1.71 mg/dL — AB (ref 0.61–1.24)
Chloride: 112 mmol/L — ABNORMAL HIGH (ref 101–111)
GFR calc Af Amer: 41 mL/min — ABNORMAL LOW (ref >60)
GFR calc non Af Amer: 36 mL/min — ABNORMAL LOW (ref >60)
GLUCOSE: 109 mg/dL — AB (ref 65–99)
Potassium: 3.3 mmol/L — ABNORMAL LOW (ref 3.5–5.1)
Sodium: 143 mmol/L (ref 135–145)

## 2015-02-13 LAB — PROTIME-INR
INR: 4.68 — AB (ref 0.00–1.49)
Prothrombin Time: 42.8 seconds — ABNORMAL HIGH (ref 11.6–15.2)

## 2015-02-13 MED ORDER — WARFARIN SODIUM 5 MG PO TABS: ORAL_TABLET | ORAL | Status: DC

## 2015-02-13 MED ORDER — CIPROFLOXACIN HCL 500 MG PO TABS: 500.0000 mg | ORAL_TABLET | Freq: Two times a day (BID) | ORAL | Status: DC

## 2015-02-13 NOTE — Clinical Social Work Note (Signed)
Pt d/c today back to [facility]. Pt's daughter, [name] and facility aware and agreeable. Will transfer with staff later today.  Derenda Fennel, LCSW [phone]

## 2015-02-13 NOTE — Care Management Note (Signed)
Case Management Note  Patient Details  Name: [name] MRN: [id number] Date of Birth: 22-Sep-1931  Subjective/Objective:                    Action/Plan:   Expected Discharge Date:                  Expected Discharge Plan:  Skilled Nursing Facility  In-House Referral:  Clinical Social Work  Discharge planning Services  CM Consult  Post Acute Care Choice:  NA Choice offered to:  NA  DME Arranged:    DME Agency:     HH Arranged:    HH Agency:     Status of Service:  Completed, signed off  Medicare Important Message Given:  Yes-second notification given Date Medicare IM Given:    Medicare IM give by:    Date Additional Medicare IM Given:    Additional Medicare Important Message give by:     If discussed at Long Length of Stay Meetings, dates discussed:    Additional Comments: Pt discharged back to [facility] today. CSW to arrange discharge to facility. Arlyss Queen Stoneridge, RN 02/13/2015, 11:30 AM

## 2015-02-13 NOTE — Care Management Important Message (Signed)
Important Message  Patient Details  Name: MALAKYE NOLDEN MRN: [id number] Date of Birth: 1931-06-14   Medicare Important Message Given:  Yes-second notification given    Cheryl Flash, RN 02/13/2015, 11:30 AM

## 2015-02-13 NOTE — Progress Notes (Signed)
Report called to the [facility]. 

## 2015-02-13 NOTE — Discharge Summary (Signed)
Physician Discharge Summary  [name] ZOX:[id number] DOB: [birth year] DOA: 02/10/2015  PCP: Frederica Kuster, MD  Admit date: 02/10/2015 Discharge date: 02/13/2015  Time spent:  minutes  Recommendations for Outpatient Follow-up:  1. Follow up with urology 9/16 as scheduled 2. Hold coumadin until INR therapeutic, check INR daily 3. Continue foley catheter 4. [facility]  Discharge Diagnoses:  Principal Problem:   Obstructive uropathy Active Problems:   COPD (chronic obstructive pulmonary disease)   Cognitive impairment   CHF (congestive heart failure)   UTI (urinary tract infection)   Cardiomyopathy   Benign localized hyperplasia of prostate with urinary retention   Bladder calculus   Acute renal failure   Elevated INR   Hydroureteronephrosis   Discharge Condition: stable  Diet recommendation: regular  Filed Weights   02/10/15 1803  Weight: 141.84 kg (312 lb 11.2 oz)    History of present illness:  This is an 81 year old man who was sent over from the [facility] center on 02/10/15 because he had a fever the day prior associated with diarrhea and vomiting and nausea and Dr. Leanord Hawking, who examined him, felt that he had right lower quadrant tenderness, raising the possibility of acute appendicitis. When he presented to the emergency room, he was found to be in acute renal failure and CT abdominal scan showed bilateral hydronephrosis. He had a fairly permanent Foley catheter in situ and this catheter was changed approximately 3-4 weeks prior. A new catheter was placed in the emergency room on day of admission with drainage of purulent urine, significant amounts. His abdominal discomfort appeared to have improved.   Hospital Course:  1. Obstructive uropathy. CT revealed bladder calculus with bilateral hydroureteronephosis.  Evaluated by urology who recommend keeping foley and will follow up 9/16  as previously scheduled. At discharge urine output from foley good.  2. Acute renal  failure. Related to above. Creatinine trended down to 1.71 from 3.30 on admission after IV fluids. Recommend BMET 9/16 to track kidney function  3. UTI. Urine culture no growth by discharge. Provided  Rocephin day #3. Leukocytosis resolved at discharge.  Remained afebrile and non-toxic appearing. Will discharge with 7 days Cipro to complete 10 day course. 4. Dementia. This appears to be moderate to severe and remained  at baseline. Oriented to self only. Able to make needs and wants known. 5. History of congestive heart failure. Diastolic. Echo 09/2014 with EF 55% and grade 1 diastolic dysfunction. He remained compensated during hospitalization.  6. History of atrial fibrillation. Patient on warfarin for chronic anticoagulation therapy on admission. INR elevated and remained so during hospitialization but trending downward. No evidence of bleeding. Warfarin held. Recommend holding warfarin until INR therapeutic and INR to be check daily to ensure timeliness of resumption.  7. COPD. remained stable at baseline. Chest xray without acute changes 8. Elevated INR. Trending down. See #6. No s/sx bleeding  Procedures:  Foley catheter changed  Consultations:  urology  Discharge Exam: Filed Vitals:   02/13/15 0604  BP: 142/57  Pulse: 71  Temp: 98.3 F (36.8 C)  Resp: 20    General: appears comfortable  Cardiovascular: rrr no m/g/r Respiratory: normal effort BS clear bilaterally   Discharge Instructions   Discharge Instructions    Diet - low sodium heart healthy    Complete by:  As directed      Discharge instructions    Complete by:  As directed   Hold coumadin until INR therapeutic Check INR daily Check BMET 9/16 to track  kidney function     Increase activity slowly    Complete by:  As directed           Current Discharge Medication List    START taking these medications   Details  ciprofloxacin (CIPRO) 500 MG tablet Take 1 tablet (500 mg total) by mouth 2 (two) times  daily. Qty: 14 tablet, Refills: 0      CONTINUE these medications which have CHANGED   Details  warfarin (COUMADIN) 5 MG tablet Stop coumadin until INR therapeutic then adjust dosing based on INR      CONTINUE these medications which have NOT CHANGED   Details  acetaminophen (TYLENOL) 500 MG tablet Take 1,000 mg by mouth every 6 (six) hours as needed for mild pain or fever.     Amino Acids-Protein Hydrolys (FEEDING SUPPLEMENT, PRO-STAT SUGAR FREE 64,) LIQD Take 30 mLs by mouth daily.    diltiazem (CARDIZEM CD) 120 MG 24 hr capsule Take 120 mg by mouth daily.     doxazosin (CARDURA) 8 MG tablet Take 8 mg by mouth daily.    finasteride (PROSCAR) 5 MG tablet Take 5 mg by mouth at bedtime.     ipratropium-albuterol (DUONEB) 0.5-2.5 (3) MG/3ML SOLN Take 3 mLs by nebulization every 6 (six) hours as needed (shortness of breath).    latanoprost (XALATAN) 0.005 % ophthalmic solution Place 1 drop into both eyes at bedtime.     metoprolol tartrate (LOPRESSOR) 25 MG tablet Take 0.5 tablets (12.5 mg total) by mouth 2 (two) times daily. Qty: 30 tablet, Refills: 0    promethazine (PHENERGAN) 12.5 MG suppository Place 12.5 mg rectally every 6 (six) hours as needed for nausea or vomiting.    sertraline (ZOLOFT) 50 MG tablet Take 50 mg by mouth daily.    ZINC OXIDE EX Apply 1 application topically once a week. Infused wrap for legs       Allergies  Allergen Reactions  . Oxycodone Hcl Other (See Comments)     makes pt "wild"  . Propoxyphene N-Acetaminophen Other (See Comments)     Makes pt. "wild"      The results of significant diagnostics from this hospitalization (including imaging, microbiology, ancillary and laboratory) are listed below for reference.    Significant Diagnostic Studies: Ct Abdomen Pelvis Wo Contrast  02/10/2015   CLINICAL DATA:  Fever, right lower quadrant abdominal pain and diarrhea.  EXAM: CT ABDOMEN AND PELVIS WITHOUT CONTRAST  TECHNIQUE: Multidetector CT  imaging of the abdomen and pelvis was performed following the standard protocol without IV contrast.  COMPARISON:  CT 12/22/2014  FINDINGS: Lower chest: Bibasilar bronchial wall thickening and areas of inspissated secretions noted. Trace pleural effusions noted with associated compressive atelectasis.  Hepatobiliary: Cholecystectomy clips are noted. Left hepatic lobe atrophy reidentified with a small amount of biliary gas reidentified. No intrahepatic ductal dilatation or common duct dilatation.  Pancreas: The pancreas is fatty replaced. No peripancreatic fluid collection.  Spleen: Normal  Adrenals/Urinary Tract: Adrenal glands are normal. Lobulated renal contour noted bilaterally with areas of too small to characterize hypodensity and other hypodense lesions which can be confidently determined to be cysts, largest of which in the right upper pole 7.6 cm image 40. There is bilateral moderate hydroureteronephrosis to the level of the ureterovesicular junctions. The bladder is distended, with a Foley catheter in place. Bladder calculus reidentified dependently. A small amount of perinephric fluid is identified bilaterally, tracking to the pelvis.  Stomach/Bowel: No bowel wall thickening or focal segmental dilatation is identified.  Vascular/Lymphatic: Infrarenal abdominal aortic aneurysm measuring 4.3 cm image 57 is unchanged. No periaortic fluid. No lymphadenopathy. Moderate atheromatous vascular calcifications.  Other: No free air.  Trace pelvic free fluid.  Musculoskeletal: Degenerative change at the spine noted.  IMPRESSION: Interval development of moderate bilateral hydroureteronephrosis with diffuse bladder dilatation and bladder calculus. There is no radiopaque calculus seen in either ureter. Findings raise the question of bladder outlet obstruction/Foley catheter obstruction, bladder mass obstructing the bilateral ureterovesicular junctions but occult at noncontrast imaging, or less likely vesicoureteral  reflux. Contrast-enhanced exam with delayed imaging through the renal collecting systems is recommended for further evaluation. If the patient cannot undergo that exam, then cystoscopy is recommended as well as bladder decompression.   Electronically Signed   By: Christiana Pellant M.D.   On: 02/10/2015 15:04   Dg Chest Portable 1 View  02/10/2015   CLINICAL DATA:  Right lower quadrant abdominal pain, cough and fever  EXAM: PORTABLE CHEST - 1 VIEW  COMPARISON:  01/11/2015  FINDINGS: Moderate enlargement of the cardiac silhouette is noted. No focal pulmonary opacity. Lungs are hypoaerated with crowding of the bronchovascular markings. Trace if any pleural fluid.  IMPRESSION: Suboptimal visualization due to patient positioning and portable technique. Cardiomegaly without focal acute finding. If symptoms persist, consider PA and lateral chest radiographs obtained at full inspiration when the patient is clinically able.   Electronically Signed   By: Christiana Pellant M.D.   On: 02/10/2015 12:20    Microbiology: Recent Results (from the past 240 hour(s))  Urine culture     Status: None   Collection Time: 02/10/15 11:13 AM  Result Value Ref Range Status   Specimen Description URINE, CATHETERIZED  Final   Special Requests NONE  Final   Culture   Final    NO GROWTH 2 DAYS Performed at [facility]    Report Status 02/12/2015 FINAL  Final  MRSA PCR Screening     Status: None   Collection Time: 02/10/15  6:19 PM  Result Value Ref Range Status   MRSA by PCR NEGATIVE NEGATIVE Final    Comment:        The GeneXpert MRSA Assay (FDA approved for NASAL specimens only), is one component of a comprehensive MRSA colonization surveillance program. It is not intended to diagnose MRSA infection nor to guide or monitor treatment for MRSA infections.      Labs: Basic Metabolic Panel:  Recent Labs Lab 02/10/15 1120 02/11/15 0627 02/12/15 0611 02/13/15 0602  NA 140 141 141 143  K 4.5 4.7 3.5  3.3*  CL 108 107 110 112*  CO2 25 27 25 24   GLUCOSE 213* 112* 105* 109*  BUN 47* 59* 55* 44*  CREATININE 3.30* 2.68* 2.09* 1.71*  CALCIUM 8.7* 8.5* 8.3* 8.5*   Liver Function Tests:  Recent Labs Lab 02/10/15 1120 02/11/15 0627  AST 23 20  ALT 18 18  ALKPHOS 74 64  BILITOT 1.3* 0.6  PROT 6.7 6.0*  ALBUMIN 2.9* 2.7*   No results for input(s): LIPASE, AMYLASE in the last 168 hours. No results for input(s): AMMONIA in the last 168 hours. CBC:  Recent Labs Lab 02/10/15 1120 02/11/15 0627 02/12/15 0611 02/13/15 0602  WBC 17.4* 13.4* 12.3* 10.0  HGB 12.4* 11.0* 10.4* 10.1*  HCT 38.6* 33.9* 31.4* 31.6*  MCV 87.9 89.4 87.0 87.1  PLT 291 259 259 252   Cardiac Enzymes: No results for input(s): CKTOTAL, CKMB, CKMBINDEX, TROPONINI in the last 168 hours. BNP: BNP (last 3 results)  No results for input(s): BNP in the last 8760 hours.  ProBNP (last 3 results) No results for input(s): PROBNP in the last 8760 hours.  CBG: No results for input(s): GLUCAP in the last 168 hours.     SignedGwenyth Bender  [facility] 02/13/2015, 11:18 AM

## 2015-02-14 ENCOUNTER — Encounter (HOSPITAL_COMMUNITY)
Admission: AD | Admit: 2015-02-14 | Discharge: 2015-02-14 | Disposition: A | Discharge status: Home / Self Care | Payer: Medicare Other | Source: Skilled Nursing Facility | Attending: Internal Medicine | Admitting: Internal Medicine

## 2015-02-14 DIAGNOSIS — I482 Chronic atrial fibrillation: Secondary | ICD-10-CM | POA: Diagnosis not present

## 2015-02-14 LAB — CBC
HCT: 31.2 % — ABNORMAL LOW (ref 39.0–52.0)
Hemoglobin: 10.1 g/dL — ABNORMAL LOW (ref 13.0–17.0)
MCH: 28.7 pg (ref 26.0–34.0)
MCHC: 32.4 g/dL (ref 30.0–36.0)
MCV: 88.6 fL (ref 78.0–100.0)
PLATELETS: 273 10*3/uL (ref 150–400)
RBC: 3.52 MIL/uL — ABNORMAL LOW (ref 4.22–5.81)
RDW: 16.5 % — AB (ref 11.5–15.5)
WBC: 8.8 10*3/uL (ref 4.0–10.5)

## 2015-02-14 LAB — BASIC METABOLIC PANEL
Anion gap: 7 (ref 5–15)
BUN: 34 mg/dL — AB (ref 6–20)
CALCIUM: 8.7 mg/dL — AB (ref 8.9–10.3)
CO2: 26 mmol/L (ref 22–32)
CREATININE: 1.5 mg/dL — AB (ref 0.61–1.24)
Chloride: 113 mmol/L — ABNORMAL HIGH (ref 101–111)
GFR calc Af Amer: 49 mL/min — ABNORMAL LOW (ref >60)
GFR, EST NON AFRICAN AMERICAN: 42 mL/min — AB (ref >60)
GLUCOSE: 108 mg/dL — AB (ref 65–99)
Potassium: 3.8 mmol/L (ref 3.5–5.1)
Sodium: 146 mmol/L — ABNORMAL HIGH (ref 135–145)

## 2015-02-14 LAB — PROTIME-INR
INR: 4.93 — ABNORMAL HIGH (ref 0.00–1.49)
PROTHROMBIN TIME: 44.5 s — AB (ref 11.6–15.2)

## 2015-02-15 ENCOUNTER — Encounter: Payer: Self-pay | Admitting: Internal Medicine

## 2015-02-15 ENCOUNTER — Encounter (HOSPITAL_COMMUNITY)
Admission: RE | Admit: 2015-02-15 | Discharge: 2015-02-15 | Disposition: A | Discharge status: Home / Self Care | Payer: Medicare Other | Source: Skilled Nursing Facility | Attending: Family Medicine | Admitting: Family Medicine

## 2015-02-15 ENCOUNTER — Non-Acute Institutional Stay (SKILLED_NURSING_FACILITY): Payer: Medicare Other | Admitting: Internal Medicine

## 2015-02-15 DIAGNOSIS — J438 Other emphysema: Secondary | ICD-10-CM

## 2015-02-15 DIAGNOSIS — N179 Acute kidney failure, unspecified: Secondary | ICD-10-CM

## 2015-02-15 DIAGNOSIS — I482 Chronic atrial fibrillation: Secondary | ICD-10-CM | POA: Diagnosis not present

## 2015-02-15 DIAGNOSIS — N139 Obstructive and reflux uropathy, unspecified: Secondary | ICD-10-CM | POA: Diagnosis not present

## 2015-02-15 DIAGNOSIS — I5040 Unspecified combined systolic (congestive) and diastolic (congestive) heart failure: Secondary | ICD-10-CM

## 2015-02-15 LAB — PROTIME-INR
INR: 3.8 — ABNORMAL HIGH (ref 0.00–1.49)
Prothrombin Time: 36.6 seconds — ABNORMAL HIGH (ref 11.6–15.2)

## 2015-02-15 NOTE — Progress Notes (Signed)
Patient ID: [name], male   DOB: [birth year], 81 y.o.   MRN: [id number]    this is an acute visit.  Level care skilled.   facility [facility]. Chief complaintacute visit status post hospitalization for obstructive uropathy with acute renal failure.  History of present was.  Patient is a pleasant 81 year old male who was sent to the ER - apparently he was seen Manson Passey with acute abdominal tenderness especially in the right lower quadrant with concerns for acute appendicitis.  Workup in the ER revealed acute renal failure CT of the abdominal scan showed bilateral hydronephrosis.    catheter was placed he does have a history of obstructive uropathy -when the catheter wasre placed apparently he had a significant amount of drainage of purulent urine and abdominal discomfort largely resolved   recommendation by urology was to keep the Foley catheter in place with follow-up apparently that was scheduled for today but it does not appear that he did gallop this was certainly have to be followed up on.  Regards to renal insufficiency creatinine was above 3 but did come down to 1.71 on discharge.  Urine culture did not show any growth he did get treated with Rocephin and leukocytosis resolved he is on Cipro for a total of 10 days.   Patient does have a history of congestive heart failure apparently this was stable during his hospitalization.  He does have history of-fibrillation is on chronic Coumadin it actually was elevated in the hospital at one time INR was above 8 this is trending down today it is at 3.8 we will continue to hold his Coumadin I suspect this can be restarted shortly.  His COPD remained stable in the hospital chest x-ray did not show any acute changes.   Currenly he is resting in bed comfortably appears to be at his baseline he does not have any complaints vital signs appear to be stable    previous medical history.   obstructive uropathy.  COPD.  Cons of  impairment.  CHF.  UTI.  Cardiomyopathy.   benign localized hyperplasia prostate with urinary retention.  Bladder calculus.   elevated INR.    Nephrosis.   Family medical social history.   patient had lived at home with his wife who is his primary caretaker I suspect he will be candidate for long-term placement secondary to increasing care needs.   he has 108 pack year smoking history he quit 12 years ago.   Medications.   Cipro 500 mg twice a day for total 10 days.   Coumadin had been 5 mg daily currently on hold secondary to elevated INR.  Tylenol thousand milligrams every 6 hours when necessary pain or fever.   Cardizem CD 120 mg daily.  Cardura 8 mg daily.  Proscar 5 mg daily at bedtime.   DuoNeb nebulizers every 6 hours when necessary.  Xalatan  0.005% 1 drop both eyes daily at bedtime.  Lopressor 12.5 mg twice a day.  Phenergan 12.5 mg every 6 hours when necessary.  Sotalol 50 mg daily.  Zinc oxide once a week.   review of systems.  This is somewhat limited secondary to cognitive impairment.  In general does not complain of fever or chills.  Skin does have significant venous stasis bilaterally which is not new does not complain of rashes or itching.  Head ears eyes nose mouth and throat does not complain of visual changes or sore throat.  Respiratory history of COPD but does not complain of increased cough  from baseline or shortness of breath from baseline.   cardiac does not complain of chest pain.   GI does not complain of abdominal pain nausea vomiting diarrhea or constipation.   GU significant history is as noted above but does not complain specifically of dysuria  Does have an indwelling Foley catheter.  Musculoskeletal has significant lower extremity weakness does not complain of joint pain currently.   neurologic does not complain of headache dizziness syncopal-type feelings.  Psych does have again cognitive impairment he appears to  be at his baseline today.  Physical exam.  Temperature is 97.6 pulse 70 respirations 20 blood pressure 140/56.  In general this is a pleasant elderly male in no distress lying comfortably in bed.  His skin is warm and dry.   He does have some chronic lower extremity issues as followed by wound care his legs are wrapped currently.  Eyes pupils appear reactive light sclera and conjunctiva are clear visual acuity appears grossly intact.  Oropharynx clear mucous membranes moist.  His chest -she has shallow air entry there is a small amount of rhonchi I suspect this is baseline o expiration there is no labored breathing.  Heart distant heart sounds regular irregular rate from what I could hear he does have significant venous stasis bilaterally lower extremities his legs are wrapped.  His abdomen is obese soft nontender positive bowel sounds.  Musculoskeletal moves his extremities upper extremities at baseline with baseline grip strength - significant lower extremity weakness with significant edema venous stasis legs are wrapped.  GU he does have a Foley catheter draining amber colored urine.  Neurologic is grossly intact his speech is clear.  Psyche is oriented to self can't carry on conversation he is alert although does have some cognitive impairment is able to carry on a conversation.  Labs.   02/14/2015.  Sodium 146 potassium 3.8 BUN 34 creatinine 1.50.   WBC 8.8 hemoglobin 10.1 platelets 273.   INR on the 15th was 4.93 again it is down to 3.8 today.   assessment and plan.   #1 history of obstructive uropathy thought secondary 2 bilateral hydroureteronephrosis - again he will need urology follow-up this apparentl was scheduled for today but he apparently did not go on not sure exactly of this specifics here but this will need expedient follow-up.  At this point he appears to be discharging urine well - this will have to be monitored-- he continues on Proscar as well as  Cardura.  #2 acute renal failure this appears resolved with resolution of his catheter issues creatinine 1.50 as of yesterday we will recheck this tomorrow.  #3 UTI apparently culture was negative he is on antibiotic nonetheless to complete a course of Cipro does not appear to be overtly symptomatic.  Therefore dementia appears to be at baseline he is alert but somewhat confused which is not new he is able to converse and make his needs known.   number 5-history CHF diastolic echo showed 55% ejection fraction in May 2016 and grade 1 diastolic dysfunction -at this point appears to be clinically stable- he is on a beta blocker.  #6 history COPD this appears stable he has when necessary nebulizers this will have to be encouraged.  #7  History of atrial fibrillation he is on diltiazem as well as Lopressor- was on Coumadin for anticoagulation this is being held secondary to supratherapeutic INR history down will update an INR tomorrow-- also will update CBC.   #8 chronic anticoagulation on Coumadin-as noted above  this is being held secondary to elevated INR update INR is pending for tomorrow.   #9 depression he continues on Zoloft this appears to be stable currently.   [id number] note grater than 35 minutes spent assessing patient-reviewing his chart- and coordinating and formulating a plan of care for numerous diagnoses-of note greater than 50% of time spent coordinating plan of care

## 2015-02-16 ENCOUNTER — Encounter (HOSPITAL_COMMUNITY)
Admission: RE | Admit: 2015-02-16 | Discharge: 2015-02-16 | Disposition: A | Discharge status: Home / Self Care | Payer: Medicare Other | Source: Skilled Nursing Facility | Attending: Internal Medicine | Admitting: Internal Medicine

## 2015-02-16 ENCOUNTER — Non-Acute Institutional Stay (SKILLED_NURSING_FACILITY): Payer: Medicare Other | Admitting: Internal Medicine

## 2015-02-16 DIAGNOSIS — N179 Acute kidney failure, unspecified: Secondary | ICD-10-CM | POA: Diagnosis not present

## 2015-02-16 DIAGNOSIS — R410 Disorientation, unspecified: Secondary | ICD-10-CM

## 2015-02-16 DIAGNOSIS — J449 Chronic obstructive pulmonary disease, unspecified: Secondary | ICD-10-CM

## 2015-02-16 DIAGNOSIS — I482 Chronic atrial fibrillation, unspecified: Secondary | ICD-10-CM

## 2015-02-16 DIAGNOSIS — N139 Obstructive and reflux uropathy, unspecified: Secondary | ICD-10-CM | POA: Diagnosis not present

## 2015-02-16 LAB — PROTIME-INR
INR: 3.23 — AB (ref 0.00–1.49)
PROTHROMBIN TIME: 32.4 s — AB (ref 11.6–15.2)

## 2015-02-16 LAB — BASIC METABOLIC PANEL
ANION GAP: 4 — AB (ref 5–15)
BUN: 23 mg/dL — ABNORMAL HIGH (ref 6–20)
CALCIUM: 8.2 mg/dL — AB (ref 8.9–10.3)
CO2: 27 mmol/L (ref 22–32)
Chloride: 114 mmol/L — ABNORMAL HIGH (ref 101–111)
Creatinine, Ser: 1.26 mg/dL — ABNORMAL HIGH (ref 0.61–1.24)
GFR calc Af Amer: 60 mL/min — ABNORMAL LOW (ref >60)
GFR, EST NON AFRICAN AMERICAN: 52 mL/min — AB (ref >60)
GLUCOSE: 96 mg/dL (ref 65–99)
POTASSIUM: 3.5 mmol/L (ref 3.5–5.1)
SODIUM: 145 mmol/L (ref 135–145)

## 2015-02-16 LAB — CBC
HEMATOCRIT: 30.3 % — AB (ref 39.0–52.0)
HEMOGLOBIN: 9.8 g/dL — AB (ref 13.0–17.0)
MCH: 28.5 pg (ref 26.0–34.0)
MCHC: 32.3 g/dL (ref 30.0–36.0)
MCV: 88.1 fL (ref 78.0–100.0)
Platelets: 283 10*3/uL (ref 150–400)
RBC: 3.44 MIL/uL — AB (ref 4.22–5.81)
RDW: 16.2 % — ABNORMAL HIGH (ref 11.5–15.5)
WBC: 8.6 10*3/uL (ref 4.0–10.5)

## 2015-02-16 NOTE — Progress Notes (Signed)
Patient ID: KHALEEM BURCHILL, male   DOB: 1931/09/06, 81 y.o.   MRN: [id number] Facility; [facility] Chief complaint; readmission to the facility post stay at [facility] 9/11 through 9/14 History; I sent Mr. Kwiatek lout to the hospital last weekend. He had had diarrhea and then vomiting which was her current poor oral intake. I really noted a lot of significant right lower quadrant tenderness to even light palpation. I send him to the hospital querying appendicitis. Based on an unenhanced CT scan he had hydronephrosis, they needed to readjust his Foley catheter. He apparently had purulent urine. He has a chronic Foley catheter secondary to BPH. His urine culture was negative on 9/11. I don't see any blood cultures. His white count was elevated to over 17. He was in acute renal failure which corrected.[name] His admission creatinine was 3.3. In spite of the fact that his urine culture was negative he was felt to have a UTI he was given Rocephin for 3 days and then switched to 7 days of Cipro to complete a 10 day course of antibiotics.  He is come back to the facility not looking particularly well today. He is lethargic. May be a 25% of his meals. His INR yesterday was 3.8 today it is 3.23        CLINICAL DATA:  Fever, right lower quadrant abdominal pain and diarrhea.   EXAM: CT ABDOMEN AND PELVIS WITHOUT CONTRAST   TECHNIQUE: Multidetector CT imaging of the abdomen and pelvis was performed following the standard protocol without IV contrast.   COMPARISON:  CT 12/22/2014   FINDINGS: Lower chest: Bibasilar bronchial wall thickening and areas of inspissated secretions noted. Trace pleural effusions noted with associated compressive atelectasis.   Hepatobiliary: Cholecystectomy clips are noted. Left hepatic lobe atrophy reidentified with a small amount of biliary gas reidentified. No intrahepatic ductal dilatation or common duct dilatation.   Pancreas: The pancreas is fatty replaced. No peripancreatic  fluid collection.   Spleen: Normal   Adrenals/Urinary Tract: Adrenal glands are normal. Lobulated renal contour noted bilaterally with areas of too small to characterize hypodensity and other hypodense lesions which can be confidently determined to be cysts, largest of which in the right upper pole 7.6 cm image 40. There is bilateral moderate hydroureteronephrosis to the level of the ureterovesicular junctions. The bladder is distended, with a Foley catheter in place. Bladder calculus reidentified dependently. A small amount of perinephric fluid is identified bilaterally, tracking to the pelvis.   Stomach/Bowel: No bowel wall thickening or focal segmental dilatation is identified.   Vascular/Lymphatic: Infrarenal abdominal aortic aneurysm measuring 4.3 cm image 57 is unchanged. No periaortic fluid. No lymphadenopathy. Moderate atheromatous vascular calcifications.   Other: No free air.  Trace pelvic free fluid.   Musculoskeletal: Degenerative change at the spine noted.   IMPRESSION: Interval development of moderate bilateral hydroureteronephrosis with diffuse bladder dilatation and bladder calculus. There is no radiopaque calculus seen in either ureter. Findings raise the question of bladder outlet obstruction/Foley catheter obstruction, bladder mass obstructing the bilateral ureterovesicular junctions but occult at noncontrast imaging, or less likely vesicoureteral reflux. Contrast-enhanced exam with delayed imaging through the renal collecting systems is recommended for further evaluation. If the patient cannot undergo that exam, then cystoscopy is recommended as well as bladder decompression.     CBC Latest Ref Rng 02/16/2015 02/14/2015 02/13/2015  WBC 4.0 - 10.5 K/uL 8.6 8.8 10.0  Hemoglobin 13.0 - 17.0 g/dL 7.8(G) 10.1(L) 10.1(L)  Hematocrit 39.0 - 52.0 % 30.3(L) 31.2(L) 31.6(L)  Platelets 150 - 400 K/uL 283 273 252   BMP Latest Ref Rng 02/16/2015 02/14/2015 02/13/2015   Glucose 65 - 99 mg/dL 96 161(W) 960(A)  BUN 6 - 20 mg/dL 54(U) 98(J) 19(J)  Creatinine 0.61 - 1.24 mg/dL 4.78(G) 9.56(O) 1.30(Q)  BUN/Creat Ratio 10 - 22 - - -  Sodium 135 - 145 mmol/L 145 146(H) 143  Potassium 3.5 - 5.1 mmol/L 3.5 3.8 3.3(L)  Chloride 101 - 111 mmol/L 114(H) 113(H) 112(H)  CO2 22 - 32 mmol/L 27 26 24   Calcium 8.9 - 10.3 mg/dL 8.2(L) 8.7(L) 8.5(L)    Past Medical History  Diagnosis Date  . Venous insufficiency   . AAA (abdominal aortic aneurysm)   . Ulcer   . DVT (deep venous thrombosis) 05/04/2013    "LLE"  . Cellulitis   . Nicotine dependence   . Bronchospasm   . Elevated PSA   . BPH (benign prostatic hyperplasia)   . COPD (chronic obstructive pulmonary disease)   . Cognitive impairment   . Daily headache   . Arthritis     "probably on the right leg/hip" (09/20/2013)  . Dementia     "don't know exactly what kind" (09/20/2013)  . Kidney stones   . Glaucoma, both eyes   . Atrial fibrillation   . Fall at home September 18, 2013  . Benign localized hyperplasia of prostate with urinary retention 12/23/2014  . AAA (abdominal aortic aneurysm) without rupture 10/06/2012  . Anticoagulation goal of INR 2 to 3 06/01/2013  . B12 deficiency 09/20/2013  . Bladder calculus 12/23/2014    2.6cm and present since at least 2011 when it was about 1.6cm.   . Cardiomyopathy 10/15/2014  . CHF (congestive heart failure) 09/26/2013  . Chronic venous insufficiency 10/06/2012  . Edema-Bilat Leg and foot 03/07/2013  . Multifactorial dementia 10/06/2012  . Obesity, unspecified 10/06/2012  . Peripheral vascular disease 09/20/2012  . Pyelonephritis, acute 12/22/2014  . Weakness 09/18/2013    Past Surgical History  Procedure Laterality Date  . Cataract extraction w/ intraocular lens  implant, bilateral Bilateral   . Insitu percutaneous pinning femur Right 1986  . Hip fracture surgery Right 2009  . Back surgery    . Posterior laminectomy / decompression lumbar spine  2004    Decompressive  laminectomy unilaterally on the right at L4-5 with  . Femur fracture surgery Right 04/1985  . Cystoscopy w/ stone manipulation  1970's    "once"  . Spine surgery    . Eye surgery    . Cholecystectomy  2011    Gall Bladder    Social; the patient was at home with his wife as a primary caregiver. He has involved children. I do not see an advanced directive in his chart. He has very limited functional ability, he is a Nurse, adult transfer in the facility. I think he was transitioning to a long-term care resident as it was not felt that his wife would be able to provide the care necessary for him to return home.  reports that he quit smoking about 12 years ago. His smoking use included Cigarettes. He has a 108 pack-year smoking history. His smokeless tobacco use includes Snuff. He reports that he does not drink alcohol or use illicit drugs.   Family MV:[id number] that his mother is deceased. He indicated that his father is deceased. He indicated that both of his brothers are deceased.   Review of systems; not currently possible as the patient is too lethargic.  Physical examination Gen. the  patient will briefly wake up and then fades back to sleep Vitals temperature 97.9 pulse 94 respirations 20 blood pressure 140/69 O2 sat 96% on room air HEENT; mucous membranes seem somewhat dry. Lymph nonpalpable in the cervical clavicular axilla re-areas. Respiratory very shallow air entry bilaterally with inspiratory and a torn wheezing. Compatible with his known COPD. There is no accessory muscle use and work of breathing it appears to be normal. Cardiac; heart sounds are irregular compatible with his known atrial fibrillation 2/6 systolic ejection murmur at the lower left sternal border is JVP is not elevated. Abdomen; this is distended and quiet; there is no guarding no rebound no tenderness no right lower quadrant tenderness. GU; he has a Foley catheter in place which appears to be draining clear urine.  There is no cost to vertebral angle tenderness. Extremities; his legs are wrapped in Kerlix Coban wraps. He has known severe bilateral venous stasis and inflammation and recurrent stasis ulceration. I did not remove the wraps today Neurologic; he is able to bring both his arms up pronator drift was negative he is able to move both his legs I could not test strength here. Mental status; not nearly as alert as I am used to seeing him  Impression/plan #1 bilateral hydronephrosis secondary to bladder calculus. He is supposed to follow-up with urology on 9/16 which should've been yesterday. His Foley catheter was changed #2 acute renal failure creatinine down from 3.3 on admission, today this is 1.26. #3 felt to have a UTI although his culture was negative. White count today was 8.6 #4 supratherapeutic INR. He is on Coumadin for chronic atrial fibrillation. He does not have any evidence of bleeding. Dose of Coumadin was 4 mg on 8/29 #5 chronic COPD; he is slightly wheezy I'll make sure he has when necessary broncho-dilators #6 atrial fibrillation rate is controlled on Coumadin. Rate is controlled on diltiazem. #7 hypertension this appears to be fairly well controlled #8 abdominal distention with quiet bowel sounds. The cause of this is not really clear. Per his nursing assistant no diarrhea.  Patient is not doing particularly well somewhat delirious. He is not on any sedative medications. At this point I think careful monitoring is the way to go. He was supposed to have urology follow-up I'm not exactly sure if this happened.  [name]

## 2015-02-18 ENCOUNTER — Non-Acute Institutional Stay (SKILLED_NURSING_FACILITY): Payer: Medicare Other | Admitting: Internal Medicine

## 2015-02-18 ENCOUNTER — Encounter (HOSPITAL_COMMUNITY)
Admission: RE | Admit: 2015-02-18 | Discharge: 2015-02-18 | Disposition: A | Discharge status: Home / Self Care | Payer: Medicare Other | Source: Skilled Nursing Facility | Attending: Internal Medicine | Admitting: Internal Medicine

## 2015-02-18 DIAGNOSIS — N210 Calculus in bladder: Secondary | ICD-10-CM

## 2015-02-18 DIAGNOSIS — R410 Disorientation, unspecified: Secondary | ICD-10-CM

## 2015-02-18 DIAGNOSIS — N179 Acute kidney failure, unspecified: Secondary | ICD-10-CM

## 2015-02-18 DIAGNOSIS — N139 Obstructive and reflux uropathy, unspecified: Secondary | ICD-10-CM

## 2015-02-18 DIAGNOSIS — I482 Chronic atrial fibrillation: Secondary | ICD-10-CM | POA: Diagnosis not present

## 2015-02-18 LAB — PROTIME-INR
INR: 2.47 — ABNORMAL HIGH (ref 0.00–1.49)
Prothrombin Time: 26.4 seconds — ABNORMAL HIGH (ref 11.6–15.2)

## 2015-02-18 NOTE — Progress Notes (Signed)
Patient ID: [name], male   DOB: Apr 18, 1932, 81 y.o.   MRN: [id number] Facility; [facility] Chief complaint; follow-up delirium History; this is a patient who I believe is becoming a long-term patient in the facility. He is largely immobilized because of severe gait ataxia, severe osteoarthritis of the hips and knees. He was recently hospitalized with acute renal failure and bilateral hydronephrosis. He has a chronic Foley catheter. He was admitted to hospital from 9/11 through 9/14 2016. He had obstructive uropathy felt to be secondary to bladder calculus. I don't believe much more was done other than changing his Foley. CT scan of the abdomen revealed bladder calculus and bilateral hydronephrosis. We have never received any indication that his urine output was in good in the facility before he went out.  When I saw him 2 days ago to do his readmission he appeared lethargic, not able to respond well to questions although I could wake him up. The only recent issue is that his INR has been supratherapeutic. This is probably secondary to ciprofloxacin and Coumadin interaction. His urine culture was negative  This morning he appears to be a lot more alert. He appears back to his normal baseline mental status.  Past Medical History  Diagnosis Date  . Venous insufficiency   . AAA (abdominal aortic aneurysm)   . Ulcer   . DVT (deep venous thrombosis) 05/04/2013    "LLE"  . Cellulitis   . Nicotine dependence   . Bronchospasm   . Elevated PSA   . BPH (benign prostatic hyperplasia)   . COPD (chronic obstructive pulmonary disease)   . Cognitive impairment   . Daily headache   . Arthritis     "probably on the right leg/hip" (09/20/2013)  . Dementia     "don't know exactly what kind" (09/20/2013)  . Kidney stones   . Glaucoma, both eyes   . Atrial fibrillation   . Fall at home September 18, 2013  . Benign localized hyperplasia of prostate with urinary retention 12/23/2014  . AAA (abdominal aortic  aneurysm) without rupture 10/06/2012  . Anticoagulation goal of INR 2 to 3 06/01/2013  . B12 deficiency 09/20/2013  . Bladder calculus 12/23/2014    2.6cm and present since at least 2011 when it was about 1.6cm.   . Cardiomyopathy 10/15/2014  . CHF (congestive heart failure) 09/26/2013  . Chronic venous insufficiency 10/06/2012  . Edema-Bilat Leg and foot 03/07/2013  . Multifactorial dementia 10/06/2012  . Obesity, unspecified 10/06/2012  . Peripheral vascular disease 09/20/2012  . Pyelonephritis, acute 12/22/2014  . Weakness 09/18/2013   Past Surgical History  Procedure Laterality Date  . Cataract extraction w/ intraocular lens  implant, bilateral Bilateral   . Insitu percutaneous pinning femur Right 1986  . Hip fracture surgery Right 2009  . Back surgery    . Posterior laminectomy / decompression lumbar spine  2004    Decompressive laminectomy unilaterally on the right at L4-5 with  . Femur fracture surgery Right 04/1985  . Cystoscopy w/ stone manipulation  1970's    "once"  . Spine surgery    . Eye surgery    . Cholecystectomy  2011    Gall Bladder   Current medications Currently on Cipro 500 twice a day Coumadin 4 mg a day started yesterday Tylenol thousand every 6 hours when necessary Cardiac exam CD 120 daily Cardura 8 mg daily Proscar 5 mg daily Duonebs 3 ML's every 6 hours when necessary shortness of breath XL at 10 ophthalmic  Lopressor 25 twice a day Phenergan 12.5 every 6 hours when necessary Zoloft 50 daily  Review of systems Gen. patient states he feels well HEENT no headache no dizziness Respiratory no shortness of breath Cardiac no chest pain GI no abdominal pain nausea or vomiting GU Foley catheter in place which she has chronically. Extremities; he does not describe any pain, chronic Kerlix Coban wraps Mental status; he denies depressive symptoms.  Physical examination Gen. patient states he feels well. "Breakfast was good" Vitals; O2 sat is 96% on room air  respirations 18 and unlabored, pulse 72 and irregular HEENT; oral exam is normal no lesions mucous membranes moist Respiratory expiratory wheezing again noted however his work of breathing is normal there is no accessory muscle use Cardiac heart sounds are regular 2/6 systolic ejection murmur that radiates to both carotids Abdomen; somewhat distended bowel sounds are positive no liver no spleen no tenderness GU Foley catheter in place no suprapubic or costovertebral angle tenderness Extremities; he has compression wraps in place for severe bilateral venous stasis with inflammation and ulceration. There is no evidence of a DVT Neurologic; bilateral weakness however this is mild. 4+ out of 5 bilaterally in his upper extremities. 3 out of 5 in the lower extremities proximally Mental status; much brighter. No depression or delirium  Impression/plan #1 Delirium this seems better and I am less concerned than 2 days ago although delirium still can fluctuate #2 thought to have a UTI in the hospital discharged on Cipro I think the Cipro can stop his culture was negative. He had not previously been on antibiotics #3 COPD; I'm going to put him back on regular duo nebs 4 times a day #4 supratherapeutic INR likely due to interaction with ciprofloxacin. As mentioned I think the ciprofloxacin can stop #5 acute renal failure on top of chronic renal failure secondary to bladder calculus/urinary obstruction. I'll need to see where he is with his urology follow-up.

## 2015-02-22 ENCOUNTER — Encounter (HOSPITAL_COMMUNITY)
Admission: AD | Admit: 2015-02-22 | Discharge: 2015-02-22 | Disposition: A | Discharge status: Home / Self Care | Payer: Medicare Other | Source: Skilled Nursing Facility | Attending: Internal Medicine | Admitting: Internal Medicine

## 2015-02-22 DIAGNOSIS — I482 Chronic atrial fibrillation: Secondary | ICD-10-CM | POA: Insufficient documentation

## 2015-02-22 LAB — PROTIME-INR
INR: 1.73 — AB (ref 0.00–1.49)
PROTHROMBIN TIME: 20.2 s — AB (ref 11.6–15.2)

## 2015-02-25 ENCOUNTER — Encounter (HOSPITAL_COMMUNITY)
Admission: RE | Admit: 2015-02-25 | Discharge: 2015-02-25 | Disposition: A | Discharge status: Home / Self Care | Payer: Medicare Other | Source: Skilled Nursing Facility | Attending: Family Medicine | Admitting: Family Medicine

## 2015-02-25 DIAGNOSIS — I482 Chronic atrial fibrillation: Secondary | ICD-10-CM | POA: Diagnosis not present

## 2015-02-25 LAB — PROTIME-INR
INR: 1.89 — ABNORMAL HIGH (ref 0.00–1.49)
Prothrombin Time: 21.6 seconds — ABNORMAL HIGH (ref 11.6–15.2)

## 2015-03-04 ENCOUNTER — Encounter (HOSPITAL_COMMUNITY)
Admission: RE | Admit: 2015-03-04 | Discharge: 2015-03-04 | Disposition: A | Discharge status: Home / Self Care | Payer: Medicare Other | Source: Skilled Nursing Facility | Attending: Internal Medicine | Admitting: Internal Medicine

## 2015-03-04 LAB — PROTIME-INR
INR: 2.02 — AB (ref 0.00–1.49)
PROTHROMBIN TIME: 22.7 s — AB (ref 11.6–15.2)

## 2015-03-06 ENCOUNTER — Ambulatory Visit (INDEPENDENT_AMBULATORY_CARE_PROVIDER_SITE_OTHER): Payer: Medicare Other | Admitting: Urology

## 2015-03-06 DIAGNOSIS — N210 Calculus in bladder: Secondary | ICD-10-CM | POA: Diagnosis not present

## 2015-03-06 DIAGNOSIS — R339 Retention of urine, unspecified: Secondary | ICD-10-CM | POA: Diagnosis not present

## 2015-03-06 DIAGNOSIS — N401 Enlarged prostate with lower urinary tract symptoms: Secondary | ICD-10-CM

## 2015-03-11 DIAGNOSIS — Z961 Presence of intraocular lens: Secondary | ICD-10-CM | POA: Diagnosis not present

## 2015-03-11 DIAGNOSIS — Z7901 Long term (current) use of anticoagulants: Secondary | ICD-10-CM | POA: Diagnosis not present

## 2015-03-11 DIAGNOSIS — H401134 Primary open-angle glaucoma, bilateral, indeterminate stage: Secondary | ICD-10-CM | POA: Diagnosis not present

## 2015-03-13 ENCOUNTER — Non-Acute Institutional Stay (SKILLED_NURSING_FACILITY): Payer: Medicare Other | Admitting: Internal Medicine

## 2015-03-13 DIAGNOSIS — J449 Chronic obstructive pulmonary disease, unspecified: Secondary | ICD-10-CM | POA: Diagnosis not present

## 2015-03-13 DIAGNOSIS — L03116 Cellulitis of left lower limb: Secondary | ICD-10-CM

## 2015-03-13 DIAGNOSIS — I480 Paroxysmal atrial fibrillation: Secondary | ICD-10-CM

## 2015-03-13 DIAGNOSIS — N139 Obstructive and reflux uropathy, unspecified: Secondary | ICD-10-CM

## 2015-03-13 NOTE — Progress Notes (Signed)
Patient ID: [name], male   DOB: 1932/01/31, 81 y.o.   MRN: [id number]    Facility; [facility] Chief complaint; wound on the left lower extremity on the lateral malleolus/preoperative TURP review History; [name] is a gentleman I know fairly well from multiple admissions to this facility. He recently had to go to hospital for urinary retention and bilateral hydronephrosis. He has nephrolithiasis and I believe a history of BPH. He had a Foley catheter placed which she tolerated fairly well. He is being planned for a Cystolithalopaxy. His BUN and creatinine improved after insertion of a Foley catheter  The patient's major issue is COPD although he is not on oxygen. He also has atrial fibrillation with a controlled ventricular rate on chronic Coumadin for this and a history of a DVT. He is largely immobile secondary to a history of severe osteoarthritis with flexion contractures in his lower extremities. He is able to move himself around in the wheelchair.  BMP Latest Ref Rng 02/16/2015 02/14/2015 02/13/2015  Glucose 65 - 99 mg/dL 96 829(F108(H) 621(H109(H)  BUN 6 - 20 mg/dL 08(M23(H) 57(Q34(H) 46(N44(H)  Creatinine 0.61 - 1.24 mg/dL 6.29(B1.26(H) 2.84(X1.50(H) 3.24(M1.71(H)  BUN/Creat Ratio 10 - 22 - - -  Sodium 135 - 145 mmol/L 145 146(H) 143  Potassium 3.5 - 5.1 mmol/L 3.5 3.8 3.3(L)  Chloride 101 - 111 mmol/L 114(H) 113(H) 112(H)  CO2 22 - 32 mmol/L 27 26 24   Calcium 8.9 - 10.3 mg/dL 8.2(L) 8.7(L) 8.5(L)   CBC Latest Ref Rng 02/16/2015 02/14/2015 02/13/2015  WBC 4.0 - 10.5 K/uL 8.6 8.8 10.0  Hemoglobin 13.0 - 17.0 g/dL 0.1(U9.8(L) 10.1(L) 10.1(L)  Hematocrit 39.0 - 52.0 % 30.3(L) 31.2(L) 31.6(L)  Platelets 150 - 400 K/uL 283 273 252   Past Medical History  Diagnosis Date  . Venous insufficiency   . AAA (abdominal aortic aneurysm)   . Ulcer   . DVT (deep venous thrombosis) 05/04/2013    "LLE"  . Cellulitis   . Nicotine dependence   . Bronchospasm   . Elevated PSA   . BPH (benign prostatic hyperplasia)   . COPD (chronic  obstructive pulmonary disease)   . Cognitive impairment   . Daily headache   . Arthritis     "probably on the right leg/hip" (09/20/2013)  . Dementia     "don't know exactly what kind" (09/20/2013)  . Kidney stones   . Glaucoma, both eyes   . Atrial fibrillation   . Fall at home September 18, 2013  . Benign localized hyperplasia of prostate with urinary retention 12/23/2014  . AAA (abdominal aortic aneurysm) without rupture 10/06/2012  . Anticoagulation goal of INR 2 to 3 06/01/2013  . B12 deficiency 09/20/2013  . Bladder calculus 12/23/2014    2.6cm and present since at least 2011 when it was about 1.6cm.   . Cardiomyopathy 10/15/2014  . CHF (congestive heart failure) 09/26/2013  . Chronic venous insufficiency 10/06/2012  . Edema-Bilat Leg and foot 03/07/2013  . Multifactorial dementia 10/06/2012  . Obesity, unspecified 10/06/2012  . Peripheral vascular disease 09/20/2012  . Pyelonephritis, acute 12/22/2014  . Weakness 09/18/2013   Past Surgical History  Procedure Laterality Date  . Cataract extraction w/ intraocular lens  implant, bilateral Bilateral   . Insitu percutaneous pinning femur Right 1986  . Hip fracture surgery Right 2009  . Back surgery    . Posterior laminectomy / decompression lumbar spine  2004    Decompressive laminectomy unilaterally on the right at L4-5 with  .  Femur fracture surgery Right 04/1985  . Cystoscopy w/ stone manipulation  1970's    "once"  . Spine surgery    . Eye surgery    . Cholecystectomy  2011    Gall Bladder   Current medications Pro-stat 30 ML's daily Coumadin 4.5 by mouth daily Tylenol 500 every 6 hours when necessary Cardia exam 120 daily Cardura 8 mg daily Finasteride 5 mg daily Metopic low 12.5 twice a day Xalatan ophthalmic Albuterol nebulizers every 2 when necessary Zoloft 75 daily Duonebs him 4 times a day when necessary  Review of systems Gen. the patient states he feels well no fever or chills Respiratory he does not describe shortness  of breath with minimal exertion although his sick cavity is fairly limited. Cardiac no clear chest pain or palpitations. Abdomen states he had a normal formed BM today no chest pain  GU Foley catheter in place. Extremities he has severe bilateral venous insufficiency small wound on the left lateral leg and a more substantial area with surrounding erythema on the left lateral malleolus Mental status he seems at his baseline. He is felt to have some degree of dementia  Physical examination Gen. patient is lying flat in bed in no distress Vitals O2 sat 95% on room air respirations 20 pulse rate 79 blood pressure 140/70 Respiratory shallow air entry but no crackles or wheezes Cardiac heart sounds are regular 2/6 systolic ejection murmur that does not radiate JVP is not elevated Abdomen; obese no liver no spleen no tenderness GU no suprapubic or costovertebral angle tenderness Extremities. Severe bilateral venous stasis. There is a small open wound on the left lateral leg the area over the left lateral malleolus probably has some degree of cellulitis.  Impression/plan #1 COPD this is significant but generally stable. Not a contraindication to surgery #2 chronic anticoagulation secondary to atrial fibrillation and a history of DVT. He will need coumadin d/c prior to the procedure. ?lovenox bridge #3 venous insufficiency and inflammation with secondary ulceration and coexistent cellulitis. #4 going for a urologic procedure which is relatively low risk I don't think there is any particular issue here. #5 no contraindication to his urologic procedure. His chads vascular score is 6 out of 9. He probably can be bridged with Lovenox I just need to know the exact date of his surgery. We'll give him his metoprolol on the day of surgery #6 cellulitis in the left lateral malleolus. Started him on doxycycline. #7 CHF; There is a hsitory of this although his last Echo (below) showed a preserved left ventricular  function. No right ventricular dysfunction. He does not have a CAD history  I'll update his lab work next week    ------------------------------------------------------------------- Echocardiography  Patient:    Durrel, Mcnee MR #:       [id number] Study Date: 10/15/2014 Gender:     M Age:        81 Height:     200.7 cm Weight:     132 kg BSA:        2.74 m^2 Pt. Status: Room:       2W09C   ATTENDING    Joanna Puff  ORDERING     Carolyne Littles J  REFERRING    Carolyne Littles J  PERFORMING   [place], Inpatient  SONOGRAPHER  Sheralyn Boatman  cc:  ------------------------------------------------------------------- LV EF: 55% -   60%  ------------------------------------------------------------------- Indications:      425.4 Other primary  Cardiomyopathies.  ------------------------------------------------------------------- History:   PMH:  Abdominal aortic aneurysm.  Altered mental status.  Atrial fibrillation.  Atrial fibrillation.  Congestive heart failure.  Bacteremia.  Chronic obstructive pulmonary disease.  ------------------------------------------------------------------- Study Conclusions  - Left ventricle: Systolic function was normal. The estimated   ejection fraction was in the range of 55% to 60%. Although no   diagnostic regional wall motion abnormality was identified, this   possibility cannot be completely excluded on the basis of this   study. Doppler parameters are consistent with abnormal left   ventricular relaxation (grade 1 diastolic dysfunction). - Aortic valve: Valve area (VTI): 3.27 cm^2. Valve area (Vmax):   3.69 cm^2. Valve area (Vmean): 3.08 cm^2.  Echocardiography.  M-mode, complete 2D, spectral Doppler, and color Doppler.  Birthdate:  Patient birthdate: 11-06-1931.  Age:  Patient is 81 yr old.  Sex:  Gender: male.    BMI: 32.8 kg/m^2.  Blood pressure:     120/61  Patient status:  Inpatient.  Study date: Study  date: 10/15/2014. Study time: 09:28 AM.  Location:  Bedside.   -------------------------------------------------------------------  ------------------------------------------------------------------- Left ventricle:  Systolic function was normal. The estimated ejection fraction was in the range of 55% to 60%. Although no diagnostic regional wall motion abnormality was identified, this possibility cannot be completely excluded on the basis of this study. Doppler parameters are consistent with abnormal left ventricular relaxation (grade 1 diastolic dysfunction).  ------------------------------------------------------------------- Aortic valve:  Poorly visualized.  Doppler:   There was no stenosis.      VTI ratio of LVOT to aortic valve: 0.79. Valve area (VTI): 3.27 cm^2. Indexed valve area (VTI): 1.19 cm^2/m^2. Peak velocity ratio of LVOT to aortic valve: 0.89. Valve area (Vmax): 3.69 cm^2. Indexed valve area (Vmax): 1.35 cm^2/m^2. Mean velocity ratio of LVOT to aortic valve: 0.74. Valve area (Vmean): 3.08 cm^2. Indexed valve area (Vmean): 1.12 cm^2/m^2.    Mean gradient (S): 4 mm Hg. Peak gradient (S): 7 mm Hg.  ------------------------------------------------------------------- Aorta:  The aorta was poorly visualized. Ascending aorta: The ascending aorta was mildly dilated.  ------------------------------------------------------------------- Mitral valve:   Structurally normal valve.   Leaflet separation was normal.  Doppler:  Transvalvular velocity was within the normal range. There was no evidence for stenosis. There was no regurgitation.    Peak gradient (D): 3 mm Hg.  ------------------------------------------------------------------- Left atrium:  The atrium was at the upper limits of normal in size.   ------------------------------------------------------------------- Right ventricle:  The cavity size was normal. Wall thickness was normal. Systolic function was  normal.  ------------------------------------------------------------------- Right atrium:  The atrium was normal in size.  ------------------------------------------------------------------- Pericardium:  There was no pericardial effusion.   ------------------------------------------------------------------- Post procedure conclusions Ascending Aorta:  - The aorta was poorly visualized.

## 2015-03-15 ENCOUNTER — Other Ambulatory Visit: Payer: Self-pay | Admitting: Urology

## 2015-03-18 ENCOUNTER — Encounter (HOSPITAL_COMMUNITY)
Admission: RE | Admit: 2015-03-18 | Discharge: 2015-03-18 | Disposition: A | Discharge status: Home / Self Care | Payer: Medicare Other | Source: Skilled Nursing Facility | Attending: Internal Medicine | Admitting: Internal Medicine

## 2015-03-18 ENCOUNTER — Non-Acute Institutional Stay (SKILLED_NURSING_FACILITY): Payer: Medicare Other | Admitting: Internal Medicine

## 2015-03-18 DIAGNOSIS — I482 Chronic atrial fibrillation, unspecified: Secondary | ICD-10-CM

## 2015-03-18 DIAGNOSIS — J449 Chronic obstructive pulmonary disease, unspecified: Secondary | ICD-10-CM

## 2015-03-18 LAB — BASIC METABOLIC PANEL
Anion gap: 5 (ref 5–15)
BUN: 23 mg/dL — ABNORMAL HIGH (ref 6–20)
CALCIUM: 9.1 mg/dL (ref 8.9–10.3)
CHLORIDE: 112 mmol/L — AB (ref 101–111)
CO2: 29 mmol/L (ref 22–32)
CREATININE: 1.6 mg/dL — AB (ref 0.61–1.24)
GFR calc Af Amer: 45 mL/min — ABNORMAL LOW (ref >60)
GFR calc non Af Amer: 39 mL/min — ABNORMAL LOW (ref >60)
Glucose, Bld: 103 mg/dL — ABNORMAL HIGH (ref 65–99)
Potassium: 4.3 mmol/L (ref 3.5–5.1)
SODIUM: 146 mmol/L — AB (ref 135–145)

## 2015-03-18 LAB — CBC WITH DIFFERENTIAL/PLATELET
BASOS PCT: 1 %
Basophils Absolute: 0 10*3/uL (ref 0.0–0.1)
Eosinophils Absolute: 0.6 10*3/uL (ref 0.0–0.7)
Eosinophils Relative: 8 %
HEMATOCRIT: 32.1 % — AB (ref 39.0–52.0)
HEMOGLOBIN: 9.8 g/dL — AB (ref 13.0–17.0)
LYMPHS PCT: 20 %
Lymphs Abs: 1.6 10*3/uL (ref 0.7–4.0)
MCH: 27.1 pg (ref 26.0–34.0)
MCHC: 30.5 g/dL (ref 30.0–36.0)
MCV: 88.9 fL (ref 78.0–100.0)
MONO ABS: 1.1 10*3/uL — AB (ref 0.1–1.0)
MONOS PCT: 14 %
NEUTROS ABS: 4.7 10*3/uL (ref 1.7–7.7)
NEUTROS PCT: 57 %
Platelets: 270 10*3/uL (ref 150–400)
RBC: 3.61 MIL/uL — ABNORMAL LOW (ref 4.22–5.81)
RDW: 16.5 % — AB (ref 11.5–15.5)
WBC: 8.1 10*3/uL (ref 4.0–10.5)

## 2015-03-18 LAB — URINE MICROSCOPIC-ADD ON

## 2015-03-18 LAB — URINALYSIS, ROUTINE W REFLEX MICROSCOPIC
Bilirubin Urine: NEGATIVE
Glucose, UA: NEGATIVE mg/dL
KETONES UR: NEGATIVE mg/dL
Nitrite: NEGATIVE
PROTEIN: NEGATIVE mg/dL
Specific Gravity, Urine: 1.01 (ref 1.005–1.030)
UROBILINOGEN UA: 1 mg/dL (ref 0.0–1.0)
pH: 7.5 (ref 5.0–8.0)

## 2015-03-18 LAB — PROTIME-INR
INR: 4.42 — AB (ref 0.00–1.49)
Prothrombin Time: 41 seconds — ABNORMAL HIGH (ref 11.6–15.2)

## 2015-03-18 NOTE — Progress Notes (Signed)
Patient ID: [name], male   DOB: 04-01-1932, 81 y.o.   MRN: [id number] Facility [facility] Chief complaint; increasing confusion over the weekend, high INR. History; apparently over the weekend [name] developed increasing shortness of breath as well as increasing confusion per his wife who is there. I'd seen him last week for a wound on the left lateral malleolus that I thought might have coexistent sialitis and put him on doxycycline. His INR today is up to 4.42 white count 8.1 hemoglobin 9.8 sodium 146 BUN at 23 and a creatinine of 1.6. He really doesn't have any complaints this morning.  With regards to his urologic surgery this is scheduled for October 27. His Coumadin will need to be put on hold on 10/22  BMP Latest Ref Rng 03/18/2015 02/16/2015 02/14/2015  Glucose 65 - 99 mg/dL 147(W) 96 295(A)  BUN 6 - 20 mg/dL 21(H) 08(M) 57(Q)  Creatinine 0.61 - 1.24 mg/dL 4.69(G) 2.95(M) 8.41(L)  BUN/Creat Ratio 10 - 22 - - -  Sodium 135 - 145 mmol/L 146(H) 145 146(H)  Potassium 3.5 - 5.1 mmol/L 4.3 3.5 3.8  Chloride 101 - 111 mmol/L 112(H) 114(H) 113(H)  CO2 22 - 32 mmol/L Calcium 8.9 - 10.3 mg/dL 9.1 2.4(M) 0.1(U)    Past Medical History  Diagnosis Date  . Venous insufficiency   . AAA (abdominal aortic aneurysm)   . Ulcer   . DVT (deep venous thrombosis) 05/04/2013    "LLE"  . Cellulitis   . Nicotine dependence   . Bronchospasm   . Elevated PSA   . BPH (benign prostatic hyperplasia)   . COPD (chronic obstructive pulmonary disease)   . Cognitive impairment   . Daily headache   . Arthritis     "probably on the right leg/hip" (09/20/2013)  . Dementia     "don't know exactly what kind" (09/20/2013)  . Kidney stones   . Glaucoma, both eyes   . Atrial fibrillation   . Fall at home September 18, 2013  . Benign localized hyperplasia of prostate with urinary retention 12/23/2014  . AAA (abdominal aortic aneurysm) without rupture 10/06/2012  . Anticoagulation goal of INR 2 to 3 06/01/2013   . B12 deficiency 09/20/2013  . Bladder calculus 12/23/2014    2.6cm and present since at least 2011 when it was about 1.6cm.   . Cardiomyopathy 10/15/2014  . CHF (congestive heart failure) 09/26/2013  . Chronic venous insufficiency 10/06/2012  . Edema-Bilat Leg and foot 03/07/2013  . Multifactorial dementia 10/06/2012  . Obesity, unspecified 10/06/2012  . Peripheral vascular disease 09/20/2012  . Pyelonephritis, acute 12/22/2014  . Weakness 09/18/2013   Review of systems not possible in this patient secondary to dementia however his wife states he was much more confused over the weekend  Physical examination Gen. patient is not in any distress eating lunch.  Respiratory poor air entry with expiratory wheezing  cardiac Heart sounds irregular he appears to be euvolemic Abdomen obese no liverno tenderness no masses GU Foley catheter in place no CVA tenderness Extremities severe bilateral venous insufficiency and inflammation Skin; the area over the left lateral malleolus is stable. The erythema that I marked last week or certainly hasn't changed all that much on the doxycycline. Hematologic there is no evidence of blood loss, bleeding.   Impressions/plan Atrial fibrillation; his chads vascular score is 6. He would be reasonable to bridge him with Lovenox 3 daysMARQUARIUS LOFTONis surgery and continue this afterwards where we reintroduced the  Coumadin. #2 INR 4.42 likely secondary to antibiotics. In this case doxycycline. His Coumadin will be put on hold today. I don't think there is any reason to give him vitamin K at this point #3 COPD this is significant but stable. I will continue to give him his COPD medications and his surgery approaches #4 history of CHF I don't see any of this currently. #5 chronic renal failure. A creatinine of 1.6. He may be usually runs a little bits lower than this however this is not that dramatic. #6? Delirium; I don't see any of thisat the moment. As surgery isn't approaching  I'll probably check a urine culture

## 2015-03-19 ENCOUNTER — Encounter: Payer: Self-pay | Admitting: Family

## 2015-03-20 ENCOUNTER — Non-Acute Institutional Stay (SKILLED_NURSING_FACILITY): Payer: Medicare Other | Admitting: Internal Medicine

## 2015-03-20 ENCOUNTER — Encounter (HOSPITAL_COMMUNITY)
Admission: AD | Admit: 2015-03-20 | Discharge: 2015-03-20 | Disposition: A | Discharge status: Home / Self Care | Payer: Medicare Other | Source: Skilled Nursing Facility | Attending: Internal Medicine | Admitting: Internal Medicine

## 2015-03-20 DIAGNOSIS — L03116 Cellulitis of left lower limb: Secondary | ICD-10-CM | POA: Diagnosis not present

## 2015-03-20 DIAGNOSIS — D689 Coagulation defect, unspecified: Secondary | ICD-10-CM

## 2015-03-20 LAB — PROTIME-INR
INR: 3.61 — AB (ref 0.00–1.49)
PROTHROMBIN TIME: 35.2 s — AB (ref 11.6–15.2)

## 2015-03-21 ENCOUNTER — Telehealth (HOSPITAL_COMMUNITY): Payer: Self-pay | Admitting: Vascular Surgery

## 2015-03-21 LAB — URINE CULTURE

## 2015-03-21 NOTE — Telephone Encounter (Signed)
Appointments canceled

## 2015-03-21 NOTE — Telephone Encounter (Signed)
-----   Message from Sharee PimpleMarilyn K McChesney, RN sent at 03/21/2015 10:54 AM EDT ----- Regarding: RE: Order Needed 10/21 Cancel per Dr. Arbie CookeyEarly, patient will be followed as needed by his PCP, Dr. Gordy Levanobeson and by Dr. Juleen ChinaKohut. His daughter called about cancelling the appt a couple of days ago but Okey Regalarol hadn't asked about it yet.  I asked for her.  ----- Message -----    From: Rudean HaskellMelinda M Reaves    Sent: 03/21/2015   8:17 AM      To: Sharee PimpleMarilyn K McChesney, RN Subject: Order Needed 10/21                             [phone] Dr. Arbie CookeyEarly 10/21 F/u Nickie Retortaaa  Kay pt had an ct 01/2015- which the aaa was unchanged, would this appointment be needed?

## 2015-03-22 ENCOUNTER — Encounter (HOSPITAL_COMMUNITY)
Admission: RE | Admit: 2015-03-22 | Discharge: 2015-03-22 | Disposition: A | Discharge status: Home / Self Care | Payer: Medicare Other | Source: Skilled Nursing Facility | Attending: Family Medicine | Admitting: Family Medicine

## 2015-03-22 ENCOUNTER — Ambulatory Visit: Payer: Medicare Other | Admitting: Family

## 2015-03-22 ENCOUNTER — Other Ambulatory Visit (HOSPITAL_COMMUNITY): Payer: Medicare Other

## 2015-03-22 LAB — CBC WITH DIFFERENTIAL/PLATELET
BASOS ABS: 0 10*3/uL (ref 0.0–0.1)
Basophils Relative: 1 %
EOS ABS: 0.6 10*3/uL (ref 0.0–0.7)
Eosinophils Relative: 7 %
HEMATOCRIT: 31.5 % — AB (ref 39.0–52.0)
HEMOGLOBIN: 9.9 g/dL — AB (ref 13.0–17.0)
Lymphocytes Relative: 23 %
Lymphs Abs: 1.9 10*3/uL (ref 0.7–4.0)
MCH: 27.6 pg (ref 26.0–34.0)
MCHC: 31.4 g/dL (ref 30.0–36.0)
MCV: 87.7 fL (ref 78.0–100.0)
MONOS PCT: 12 %
Monocytes Absolute: 1 10*3/uL (ref 0.1–1.0)
NEUTROS ABS: 4.9 10*3/uL (ref 1.7–7.7)
NEUTROS PCT: 57 %
Platelets: 285 10*3/uL (ref 150–400)
RBC: 3.59 MIL/uL — AB (ref 4.22–5.81)
RDW: 16.2 % — ABNORMAL HIGH (ref 11.5–15.5)
WBC: 8.4 10*3/uL (ref 4.0–10.5)

## 2015-03-22 LAB — PROTIME-INR
INR: 2.23 — AB (ref 0.00–1.49)
PROTHROMBIN TIME: 24.5 s — AB (ref 11.6–15.2)

## 2015-03-25 ENCOUNTER — Encounter (HOSPITAL_COMMUNITY): Payer: Self-pay | Admitting: *Deleted

## 2015-03-25 NOTE — Progress Notes (Addendum)
Patient ID: [name], male   DOB: April 10, 1932, 81 y.o.   MRN: [id number]                PROGRESS NOTE  DATE:  03/20/2015           FACILITY: [facility]               LEVEL OF CARE:   SNF   Acute Visit                CHIEF COMPLAINT:  Elevated INR.     HISTORY OF PRESENT ILLNESS:  [name] is a gentleman who was on chronic Coumadin for atrial fibrillation.   I had put him on doxycycline last week for concern of the cellulitis surrounding a venous insufficiency wound over his left lateral malleolus.  This seems to have pushed his INR up to 4.42 on 03/18/2015, although doxycycline is usually one of the safer drugs for Coumadin interaction.   I held his Coumadin at that point.  Today, his INR is down to 3.61.    He is supposed to be going for a cystoscopy on 03/28/2015 by Dr. Ferd HibbsMcKinsey.    REVIEW OF SYSTEMS:    GENERAL:  The patient states he is not in any distress.   HEENT:    CHEST/RESPIRATORY:  No cough.  No sputum.    CARDIAC:  No chest pain.  No syncope or presyncope.   GI:  No nausea, vomiting, or diarrhea.  The nurses state he is eating well.      GU:  He apparently had cloudy urine.  A specimen is off.  He has a Foley catheter due to urinary retention.     PHYSICAL EXAMINATION:   GENERAL APPEARANCE:  The patient looks well.    CHEST/RESPIRATORY:  Shallow, but otherwise clear air entry bilaterally.    CARDIOVASCULAR:   CARDIAC:  Irregular heart sounds.  There are no murmurs.   He appears to be euvolemic.    GASTROINTESTINAL:   ABDOMEN:  Distended, obese.  No tenderness.  No masses.    GENITOURINARY:   BLADDER:  No suprapubic or costovertebral angle tenderness.   CIRCULATION:   EDEMA/VARICOSITIES:  Extremities:  Severe bilateral venous stasis.  This is not new for him.    SKIN:   INSPECTION:  Wound over the left lateral malleolus still has some erythema around this, although this does not seem to have been affected by the doxycycline one way or the other.     ASSESSMENT/PLAN:        Elevated INR.  Likely antibiotic/Coumadin drug interaction.  I am going to follow the PT and INR in two days.  As we are stopping the Coumadin in preparation for his cystoscopy, I have written to discontinue the Coumadin.  I am not planning to bridge him with lovenox   Venous insufficiency ulcer on the left lateral malleolus with what I thought was surrounding cellulitis.   This is less tender, but certainly still erythematous.  I am not adding any additional antibiotics for now.  This all may be related to venous inflammation.

## 2015-03-25 NOTE — Progress Notes (Signed)
Preop instructions faxed to [facility].  Fax confirmation received.  Joel Picklesella, nurse also called and stated had received faxed preop instructions. [name] stated [name].  [name] questioned should they leave off gauze and Coban.  Spoke with Joel BattenKim in Short Stay and she stated to inform Nursing Home to make sure gauze and Coban were on lower extremities.  I instructed [name] to do sponge baths if patient was currently receiving sponge baths and to make sure do not use hibiclens on open wounds and not to use on face or private parts.  [name] voiced understanding.  Also instructed [name] to apply gauze and coban as normally would .  [name] voiced understanding.  Spoke with [name] who is daughter and she stated both her and wife would be at [name] at 0800am. Alona BeneJoyce was made aware that [name] would be transporting patient to and from hospital and that they would arrange with [employer].  I informed [name] nurse that approximate pickup time would be 1100am on 03/29/2015 and that we would notify her if any change in time to pick up patient to transport back to [name].  [name], daughter , stated that if her father is not confused due to urinary tract infections he can sign own consent and if he is confused her mother signs consents.

## 2015-03-28 ENCOUNTER — Encounter (HOSPITAL_COMMUNITY): Payer: Self-pay

## 2015-03-28 ENCOUNTER — Ambulatory Visit (HOSPITAL_COMMUNITY): Payer: Medicare Other | Admitting: Certified Registered Nurse Anesthetist

## 2015-03-28 ENCOUNTER — Encounter (HOSPITAL_COMMUNITY)
Admission: RE | Disposition: A | Discharge status: Home / Self Care | Payer: Self-pay | Source: Ambulatory Visit | Attending: Urology

## 2015-03-28 ENCOUNTER — Ambulatory Visit (HOSPITAL_COMMUNITY)
Admission: RE | Admit: 2015-03-28 | Discharge: 2015-03-29 | Disposition: A | Discharge status: Home / Self Care | Payer: Medicare Other | Source: Ambulatory Visit | Attending: Urology | Admitting: Urology

## 2015-03-28 DIAGNOSIS — H409 Unspecified glaucoma: Secondary | ICD-10-CM | POA: Insufficient documentation

## 2015-03-28 DIAGNOSIS — Z9049 Acquired absence of other specified parts of digestive tract: Secondary | ICD-10-CM | POA: Insufficient documentation

## 2015-03-28 DIAGNOSIS — F17290 Nicotine dependence, other tobacco product, uncomplicated: Secondary | ICD-10-CM | POA: Insufficient documentation

## 2015-03-28 DIAGNOSIS — Z7901 Long term (current) use of anticoagulants: Secondary | ICD-10-CM | POA: Insufficient documentation

## 2015-03-28 DIAGNOSIS — N210 Calculus in bladder: Secondary | ICD-10-CM | POA: Insufficient documentation

## 2015-03-28 DIAGNOSIS — N401 Enlarged prostate with lower urinary tract symptoms: Secondary | ICD-10-CM | POA: Insufficient documentation

## 2015-03-28 DIAGNOSIS — I739 Peripheral vascular disease, unspecified: Secondary | ICD-10-CM | POA: Diagnosis not present

## 2015-03-28 DIAGNOSIS — I4891 Unspecified atrial fibrillation: Secondary | ICD-10-CM | POA: Diagnosis not present

## 2015-03-28 DIAGNOSIS — F0390 Unspecified dementia without behavioral disturbance: Secondary | ICD-10-CM | POA: Insufficient documentation

## 2015-03-28 DIAGNOSIS — Z86718 Personal history of other venous thrombosis and embolism: Secondary | ICD-10-CM | POA: Diagnosis not present

## 2015-03-28 DIAGNOSIS — M1990 Unspecified osteoarthritis, unspecified site: Secondary | ICD-10-CM | POA: Diagnosis not present

## 2015-03-28 DIAGNOSIS — E669 Obesity, unspecified: Secondary | ICD-10-CM | POA: Diagnosis not present

## 2015-03-28 DIAGNOSIS — Z79899 Other long term (current) drug therapy: Secondary | ICD-10-CM | POA: Diagnosis not present

## 2015-03-28 DIAGNOSIS — N400 Enlarged prostate without lower urinary tract symptoms: Secondary | ICD-10-CM | POA: Diagnosis not present

## 2015-03-28 DIAGNOSIS — N138 Other obstructive and reflux uropathy: Secondary | ICD-10-CM | POA: Diagnosis not present

## 2015-03-28 DIAGNOSIS — I429 Cardiomyopathy, unspecified: Secondary | ICD-10-CM | POA: Insufficient documentation

## 2015-03-28 DIAGNOSIS — J449 Chronic obstructive pulmonary disease, unspecified: Secondary | ICD-10-CM | POA: Diagnosis not present

## 2015-03-28 DIAGNOSIS — Z6834 Body mass index (BMI) 34.0-34.9, adult: Secondary | ICD-10-CM | POA: Diagnosis not present

## 2015-03-28 DIAGNOSIS — R338 Other retention of urine: Secondary | ICD-10-CM | POA: Insufficient documentation

## 2015-03-28 HISTORY — PX: CYSTOSCOPY WITH LITHOLAPAXY: SHX1425

## 2015-03-28 HISTORY — DX: Cellulitis, unspecified: L03.90

## 2015-03-28 HISTORY — PX: TRANSURETHRAL RESECTION OF PROSTATE: SHX73

## 2015-03-28 LAB — MRSA PCR SCREENING: MRSA BY PCR: NEGATIVE

## 2015-03-28 LAB — CBC
HCT: 33.4 % — ABNORMAL LOW (ref 39.0–52.0)
HCT: 33.6 % — ABNORMAL LOW (ref 39.0–52.0)
Hemoglobin: 10.4 g/dL — ABNORMAL LOW (ref 13.0–17.0)
Hemoglobin: 10.2 g/dL — ABNORMAL LOW (ref 13.0–17.0)
MCH: 27.7 pg (ref 26.0–34.0)
MCH: 27.1 pg (ref 26.0–34.0)
MCHC: 31.1 g/dL (ref 30.0–36.0)
MCHC: 30.4 g/dL (ref 30.0–36.0)
MCV: 88.8 fL (ref 78.0–100.0)
MCV: 89.4 fL (ref 78.0–100.0)
PLATELETS: 315 10*3/uL (ref 150–400)
PLATELETS: 306 10*3/uL (ref 150–400)
RBC: 3.76 MIL/uL — AB (ref 4.22–5.81)
RBC: 3.76 MIL/uL — AB (ref 4.22–5.81)
RDW: 16.7 % — AB (ref 11.5–15.5)
RDW: 16.8 % — AB (ref 11.5–15.5)
WBC: 8.5 10*3/uL (ref 4.0–10.5)
WBC: 7.8 10*3/uL (ref 4.0–10.5)

## 2015-03-28 LAB — BASIC METABOLIC PANEL
ANION GAP: 3 — AB (ref 5–15)
BUN: 21 mg/dL — ABNORMAL HIGH (ref 6–20)
CALCIUM: 9.1 mg/dL (ref 8.9–10.3)
CO2: 30 mmol/L (ref 22–32)
Chloride: 110 mmol/L (ref 101–111)
Creatinine, Ser: 1.25 mg/dL — ABNORMAL HIGH (ref 0.61–1.24)
GFR, EST NON AFRICAN AMERICAN: 52 mL/min — AB (ref >60)
GLUCOSE: 109 mg/dL — AB (ref 65–99)
Potassium: 4.1 mmol/L (ref 3.5–5.1)
SODIUM: 143 mmol/L (ref 135–145)

## 2015-03-28 LAB — PROTIME-INR
INR: 1.29 (ref 0.00–1.49)
Prothrombin Time: 16.2 seconds — ABNORMAL HIGH (ref 11.6–15.2)

## 2015-03-28 LAB — APTT: aPTT: 34 seconds (ref 24–37)

## 2015-03-28 SURGERY — CYSTOSCOPY, WITH BLADDER CALCULUS LITHOLAPAXY
Anesthesia: Spinal

## 2015-03-28 MED ORDER — EPHEDRINE SULFATE 50 MG/ML IJ SOLN
INTRAMUSCULAR | Status: AC
  Filled 2015-03-28: qty 1

## 2015-03-28 MED ORDER — OXYCODONE HCL 5 MG PO TABS: 5.0000 mg | ORAL_TABLET | ORAL | Status: DC | PRN

## 2015-03-28 MED ORDER — PROPOFOL 500 MG/50ML IV EMUL
INTRAVENOUS | Status: DC | PRN
  Administered 2015-03-28: 25 ug/kg/min via INTRAVENOUS

## 2015-03-28 MED ORDER — PROPOFOL 10 MG/ML IV BOLUS
INTRAVENOUS | Status: AC
  Filled 2015-03-28: qty 20

## 2015-03-28 MED ORDER — 0.9 % SODIUM CHLORIDE (POUR BTL) OPTIME
TOPICAL | Status: DC | PRN
  Administered 2015-03-28: 1000 mL

## 2015-03-28 MED ORDER — DOXAZOSIN MESYLATE 4 MG PO TABS
8.0000 mg | ORAL_TABLET | Freq: Every morning | ORAL | Status: DC
  Administered 2015-03-29: 8 mg via ORAL
  Filled 2015-03-28: qty 2

## 2015-03-28 MED ORDER — HYDROCODONE-ACETAMINOPHEN 5-325 MG PO TABS
1.0000 | ORAL_TABLET | ORAL | Status: DC | PRN
  Administered 2015-03-29: 1 via ORAL
  Filled 2015-03-28: qty 1

## 2015-03-28 MED ORDER — LATANOPROST 0.005 % OP SOLN
1.0000 [drp] | Freq: Every day | OPHTHALMIC | Status: DC
  Administered 2015-03-28: 1 [drp] via OPHTHALMIC
  Filled 2015-03-28: qty 2.5

## 2015-03-28 MED ORDER — BELLADONNA ALKALOIDS-OPIUM 16.2-60 MG RE SUPP: 1.0000 | Freq: Four times a day (QID) | RECTAL | Status: DC | PRN

## 2015-03-28 MED ORDER — SENNA 8.6 MG PO TABS
1.0000 | ORAL_TABLET | Freq: Two times a day (BID) | ORAL | Status: DC
  Administered 2015-03-28 – 2015-03-29 (×2): 8.6 mg via ORAL
  Filled 2015-03-28 (×2): qty 1

## 2015-03-28 MED ORDER — SODIUM CHLORIDE 0.9 % IV SOLN
INTRAVENOUS | Status: DC
  Administered 2015-03-28 – 2015-03-29 (×2): via INTRAVENOUS

## 2015-03-28 MED ORDER — FENTANYL CITRATE (PF) 100 MCG/2ML IJ SOLN: 25.0000 ug | INTRAMUSCULAR | Status: DC | PRN

## 2015-03-28 MED ORDER — PROMETHAZINE HCL 25 MG RE SUPP
12.5000 mg | Freq: Four times a day (QID) | RECTAL | Status: DC | PRN
Start: 2015-03-28 — End: 2015-03-29

## 2015-03-28 MED ORDER — LIDOCAINE HCL (CARDIAC) 20 MG/ML IV SOLN
INTRAVENOUS | Status: DC | PRN
  Administered 2015-03-28: 100 mg via INTRAVENOUS

## 2015-03-28 MED ORDER — PIPERACILLIN-TAZOBACTAM 3.375 G IVPB
INTRAVENOUS | Status: AC
  Filled 2015-03-28: qty 50

## 2015-03-28 MED ORDER — POLYETHYLENE GLYCOL 3350 17 G PO PACK: 17.0000 g | PACK | Freq: Every day | ORAL | Status: DC | PRN

## 2015-03-28 MED ORDER — DIPHENHYDRAMINE HCL 50 MG/ML IJ SOLN: 12.5000 mg | Freq: Four times a day (QID) | INTRAMUSCULAR | Status: DC | PRN

## 2015-03-28 MED ORDER — FINASTERIDE 5 MG PO TABS
5.0000 mg | ORAL_TABLET | Freq: Every day | ORAL | Status: DC
  Administered 2015-03-28: 5 mg via ORAL
  Filled 2015-03-28: qty 1

## 2015-03-28 MED ORDER — FENTANYL CITRATE (PF) 100 MCG/2ML IJ SOLN
INTRAMUSCULAR | Status: AC
  Filled 2015-03-28: qty 4

## 2015-03-28 MED ORDER — SODIUM CHLORIDE 0.9 % IJ SOLN
INTRAMUSCULAR | Status: AC
  Filled 2015-03-28: qty 10

## 2015-03-28 MED ORDER — IPRATROPIUM-ALBUTEROL 0.5-2.5 (3) MG/3ML IN SOLN: 3.0000 mL | Freq: Four times a day (QID) | RESPIRATORY_TRACT | Status: DC | PRN

## 2015-03-28 MED ORDER — BUPIVACAINE IN DEXTROSE 0.75-8.25 % IT SOLN
INTRATHECAL | Status: DC | PRN
  Administered 2015-03-28: 2 mL via INTRATHECAL

## 2015-03-28 MED ORDER — SODIUM CHLORIDE 0.9 % IR SOLN
Status: DC | PRN
  Administered 2015-03-28: 27000 mL via INTRAVESICAL

## 2015-03-28 MED ORDER — ONDANSETRON HCL 4 MG/2ML IJ SOLN: 4.0000 mg | INTRAMUSCULAR | Status: DC | PRN

## 2015-03-28 MED ORDER — METOPROLOL TARTRATE 25 MG PO TABS
12.5000 mg | ORAL_TABLET | Freq: Two times a day (BID) | ORAL | Status: DC
  Administered 2015-03-29: 12.5 mg via ORAL
  Filled 2015-03-28 (×2): qty 1

## 2015-03-28 MED ORDER — EPHEDRINE SULFATE 50 MG/ML IJ SOLN
INTRAMUSCULAR | Status: DC | PRN
  Administered 2015-03-28: 5 mg via INTRAVENOUS
  Administered 2015-03-28: 10 mg via INTRAVENOUS

## 2015-03-28 MED ORDER — DILTIAZEM HCL ER COATED BEADS 120 MG PO CP24
120.0000 mg | ORAL_CAPSULE | Freq: Every morning | ORAL | Status: DC
  Administered 2015-03-29: 120 mg via ORAL
  Filled 2015-03-28: qty 1

## 2015-03-28 MED ORDER — LACTATED RINGERS IV SOLN
INTRAVENOUS | Status: DC | PRN
  Administered 2015-03-28: 11:00:00 via INTRAVENOUS

## 2015-03-28 MED ORDER — DIPHENHYDRAMINE HCL 12.5 MG/5ML PO ELIX: 12.5000 mg | ORAL_SOLUTION | Freq: Four times a day (QID) | ORAL | Status: DC | PRN

## 2015-03-28 MED ORDER — ONDANSETRON HCL 4 MG/2ML IJ SOLN
INTRAMUSCULAR | Status: DC | PRN
  Administered 2015-03-28: 4 mg via INTRAVENOUS

## 2015-03-28 MED ORDER — PROMETHAZINE HCL 25 MG/ML IJ SOLN: 6.2500 mg | INTRAMUSCULAR | Status: DC | PRN

## 2015-03-28 MED ORDER — PIPERACILLIN-TAZOBACTAM 3.375 G IVPB 30 MIN
3.3750 g | INTRAVENOUS | Status: AC
  Administered 2015-03-28: 3.375 g via INTRAVENOUS
  Filled 2015-03-28: qty 50

## 2015-03-28 MED ORDER — SERTRALINE HCL 50 MG PO TABS
75.0000 mg | ORAL_TABLET | Freq: Every morning | ORAL | Status: DC
  Administered 2015-03-29: 75 mg via ORAL
  Filled 2015-03-28 (×2): qty 1

## 2015-03-28 MED ORDER — HYDROMORPHONE HCL 1 MG/ML IJ SOLN: 0.5000 mg | INTRAMUSCULAR | Status: DC | PRN

## 2015-03-28 MED ORDER — ONDANSETRON HCL 4 MG/2ML IJ SOLN
INTRAMUSCULAR | Status: AC
  Filled 2015-03-28: qty 2

## 2015-03-28 MED ORDER — LIDOCAINE HCL (CARDIAC) 20 MG/ML IV SOLN
INTRAVENOUS | Status: AC
Start: 2015-03-28 — End: 2015-03-28
  Filled 2015-03-28: qty 5

## 2015-03-28 SURGICAL SUPPLY — 18 items
BAG URINE DRAINAGE (UROLOGICAL SUPPLIES) ×3 IMPLANT
BAG URO CATCHER STRL LF (DRAPE) ×3 IMPLANT
CARTRIDGE STONEBREAK CO2 KIDNE (ELECTROSURGICAL) ×6 IMPLANT
CATH FOLEY 3WAY 30CC 22FR (CATHETERS) ×3 IMPLANT
GLOVE BIO SURGEON STRL SZ8 (GLOVE) ×3 IMPLANT
GOWN STRL REUS W/TWL LRG LVL3 (GOWN DISPOSABLE) ×6 IMPLANT
HOLDER FOLEY CATH W/STRAP (MISCELLANEOUS) IMPLANT
IV NS IRRIG 3000ML ARTHROMATIC (IV SOLUTION) ×2 IMPLANT
LOOP CUT BIPOLAR 24F LRG (ELECTROSURGICAL) ×5 IMPLANT
MANIFOLD NEPTUNE II (INSTRUMENTS) ×3 IMPLANT
PACK CYSTO (CUSTOM PROCEDURE TRAY) ×3 IMPLANT
PLUG CATH AND CAP STER (CATHETERS) ×1 IMPLANT
PROBE PNEUMATIC 1.0X500 (ELECTROSURGICAL) ×2 IMPLANT
PROBE PNEUMATIC 1.6MM (ELECTROSURGICAL) ×2 IMPLANT
SYR 30ML LL (SYRINGE) ×2 IMPLANT
SYRINGE IRR TOOMEY STRL 70CC (SYRINGE) ×3 IMPLANT
TUBING CONNECTING 10 (TUBING) ×2 IMPLANT
TUBING CONNECTING 10' (TUBING) ×1

## 2015-03-28 NOTE — Anesthesia Procedure Notes (Signed)
Spinal Patient location during procedure: OR Staffing Anesthesiologist: ,  Performed by: anesthesiologist  Preanesthetic Checklist Completed: patient identified, site marked, surgical consent, pre-op evaluation, timeout performed, IV checked, risks and benefits discussed and monitors and equipment checked Spinal Block Patient position: sitting Prep: Betadine Patient monitoring: heart rate, continuous pulse ox, blood pressure and cardiac monitor Approach: midline Location: L3-4 Injection technique: single-shot Needle Needle type: Quincke  Needle gauge: 22 G Needle length: 9 cm Additional Notes Expiration date of kit checked and confirmed. Patient tolerated procedure well, without complications. CSF clear. No paresthesia.     

## 2015-03-28 NOTE — Anesthesia Preprocedure Evaluation (Addendum)
Anesthesia Evaluation  Patient identified by MRN, date of birth, ID band Patient awake    Reviewed: Allergy & Precautions, NPO status , Patient's Chart, lab work & pertinent test results  Airway Mallampati: II  TM Distance: >3 FB Neck ROM: Full    Dental no notable dental hx.    Pulmonary COPD, former smoker,    Pulmonary exam normal breath sounds clear to auscultation       Cardiovascular + Peripheral Vascular Disease and +CHF  Normal cardiovascular exam Rhythm:Regular Rate:Normal  ECHO 10-15-14: Study Conclusions  - Left ventricle: Systolic function was normal. The estimated ejection fraction was in the range of 55% to 60%. Although no diagnostic regional wall motion abnormality was identified, this possibility cannot be completely excluded on the basis of this study. Doppler parameters are consistent with abnormal left ventricular relaxation (grade 1 diastolic dysfunction). - Aortic valve: Valve area (VTI): 3.27 cm^2. Valve area (Vmax): 3.69 cm^2. Valve area (Vmean): 3.08 cm^2.    Neuro/Psych  Headaches, PSYCHIATRIC DISORDERS Dementia    GI/Hepatic negative GI ROS, Neg liver ROS,   Endo/Other  negative endocrine ROS  Renal/GU Renal disease  negative genitourinary   Musculoskeletal  (+) Arthritis ,   Abdominal (+) + obese,   Peds negative pediatric ROS (+)  Hematology negative hematology ROS (+)   Anesthesia Other Findings   Reproductive/Obstetrics negative OB ROS                           Anesthesia Physical Anesthesia Plan  ASA: III  Anesthesia Plan: Spinal   Post-op Pain Management:    Induction: Intravenous  Airway Management Planned: Natural Airway  Additional Equipment:   Intra-op Plan:   Post-operative Plan:   Informed Consent: I have reviewed the patients History and Physical, chart, labs and discussed the procedure including the risks, benefits and  alternatives for the proposed anesthesia with the patient or authorized representative who has indicated his/her understanding and acceptance.   Dental advisory given  Plan Discussed with: CRNA  Anesthesia Plan Comments: (Discussed risks and benefits of and differences between spinal and general. Discussed risks of spinal including headache, backache, failure, bleeding and hematoma, infection, and nerve damage. Patient consents to spinal. Questions answered. Coagulation studies and platelet count acceptable. Off coumadin for greater than five days. INR 1.29  Daughter requests spinal anesthesia as he had a lot of delirium with general anesthesia about ten years ago. I think the benefits of spinal outweigh the risks in this patient. He consents. Lumbar CT scan from 2015 reviewed. Spinal stenosis. No hardware.)      Anesthesia Quick Evaluation

## 2015-03-28 NOTE — Progress Notes (Signed)
Per daughter pt is not allergic to tylenol as listed in allergies.  She stated that Vicodin is one of the only pain medications he can tolerate.

## 2015-03-28 NOTE — NC FL2 (Signed)
[place] MEDICAID FL2 LEVEL OF CARE SCREENING TOOL     IDENTIFICATION  Patient Name: [name] Birthdate: 05/27/1932 Sex: male Admission Date (Current Location): 03/28/2015  [facility] and [place] Number: Geophysical data processor and Address:  [facility],  [address] [place] [address]      Provider Number: [id number]  Attending Physician Name and Address:  Malen Gauze, MD  Relative Name and Phone Number:       Current Level of Care: Hospital Recommended Level of Care: Skilled Nursing Facility Prior Approval Number:    Date Approved/Denied:   PASRR Number:  [id number]   Discharge Plan: SNF    Current Diagnoses: Patient Active Problem List   Diagnosis Date Noted  . BPH (benign prostatic hyperplasia) 03/28/2015  . Elevated INR 02/11/2015  . Hydroureteronephrosis 02/11/2015  . Obstructive uropathy 02/10/2015  . Acute renal failure (HCC) 02/10/2015  . Hearing loss of left ear due to cerumen impaction 01/11/2015  . Benign localized hyperplasia of prostate with urinary retention 12/23/2014  . Bladder calculus 12/23/2014  . Pyelonephritis, acute 12/22/2014  . Cardiomyopathy (HCC) 10/15/2014  . Dilated cardiomyopathy (HCC)   . Other emphysema (HCC)   . Venous stasis ulcer (HCC)   . UTI (urinary tract infection) 10/14/2014  . CHF (congestive heart failure) (HCC) 09/26/2013  . Atrial fibrillation with RVR 09/22/2013  . B12 deficiency 09/20/2013  . Weakness 09/18/2013  . Left leg DVT (HCC) 08/03/2013  . Anticoagulation goal of INR 2 to 3 06/01/2013  . Edema-Bilat Leg and foot 03/07/2013  . Chronic venous insufficiency 10/06/2012  . COPD (chronic obstructive pulmonary disease) (HCC) 10/06/2012  . Obesity, unspecified 10/06/2012  . AAA (abdominal aortic aneurysm) without rupture (HCC) 10/06/2012  . Cognitive impairment 10/06/2012  . Multifactorial dementia 10/06/2012  . Peripheral vascular disease (HCC) 09/20/2012    Orientation  ACTIVITIES/SOCIAL BLADDER RESPIRATION    Self, Time, Situation, Place  Active Continent O2 (As needed)  BEHAVIORAL SYMPTOMS/MOOD NEUROLOGICAL BOWEL NUTRITION STATUS      Continent    PHYSICIAN VISITS COMMUNICATION OF NEEDS Height & Weight Skin    Verbally  (200.7 cm) 310 lbs. Normal          AMBULATORY STATUS RESPIRATION    Assist extensive O2 (As needed)      Personal Care Assistance Level of Assistance  Bathing, Dressing Bathing Assistance: Limited assistance   Dressing Assistance: Limited assistance      Functional Limitations Info                SPECIAL CARE FACTORS FREQUENCY  PT (By licensed PT), OT (By licensed OT)     PT Frequency: 5 OT Frequency: 5           Additional Factors Info                  Current Medications (03/28/2015): Current Facility-Administered Medications  Medication Dose Route Frequency Provider Last Rate Last Dose  . 0.9 %  sodium chloride infusion   Intravenous Continuous Malen Gauze, MD 100 mL/hr at 03/28/15 1544    . [START ON 03/29/2015] diltiazem (CARDIZEM CD) 24 hr capsule 120 mg  120 mg Oral q morning - 10a Malen Gauze, MD      . diphenhydrAMINE (BENADRYL) injection 12.5-25 mg  12.5-25 mg Intravenous Q6H PRN Malen Gauze, MD       Or  . diphenhydrAMINE (BENADRYL) 12.5 MG/5ML elixir 12.5-25 mg  12.5-25 mg Oral Q6H PRN Mardene Celeste  McKenzie, MD      . Melene Muller ON 03/29/2015] doxazosin (CARDURA) tablet 8 mg  8 mg Oral q morning - 10a Malen Gauze, MD      . finasteride (PROSCAR) tablet 5 mg  5 mg Oral QHS Malen Gauze, MD      . HYDROcodone-acetaminophen (NORCO/VICODIN) 5-325 MG per tablet 1 tablet  1 tablet Oral Q4H PRN Malen Gauze, MD      . HYDROmorphone (DILAUDID) injection 0.5-1 mg  0.5-1 mg Intravenous Q2H PRN Malen Gauze, MD      . ipratropium-albuterol (DUONEB) 0.5-2.5 (3) MG/3ML nebulizer solution 3 mL  3 mL Nebulization Q6H PRN Malen Gauze, MD      .  latanoprost (XALATAN) 0.005 % ophthalmic solution 1 drop  1 drop Both Eyes QHS Malen Gauze, MD      . metoprolol tartrate (LOPRESSOR) tablet 12.5 mg  12.5 mg Oral BID Malen Gauze, MD      . ondansetron [facility]) injection 4 mg  4 mg Intravenous Q4H PRN Malen Gauze, MD      . opium-belladonna (B&O SUPPRETTES) 16.2-60 MG suppository 1 suppository  1 suppository Rectal Q6H PRN Malen Gauze, MD      . polyethylene glycol (MIRALAX / GLYCOLAX) packet 17 g  17 g Oral Daily PRN Malen Gauze, MD      . promethazine (PHENERGAN) suppository 12.5 mg  12.5 mg Rectal Q6H PRN Malen Gauze, MD      . senna [facility]) tablet 8.6 mg  1 tablet Oral BID Malen Gauze, MD      . Melene Muller ON 03/29/2015] sertraline (ZOLOFT) tablet 75 mg  75 mg Oral q morning - 10a Malen Gauze, MD       Do not use this list as official medication orders. Please verify with discharge summary.  Discharge Medications:   Medication List    ASK your doctor about these medications        acetaminophen 500 MG tablet  Commonly known as:  TYLENOL  Take 1,000 mg by mouth every 6 (six) hours as needed for mild pain or fever.     amoxicillin 500 MG tablet  Commonly known as:  AMOXIL  Take 500 mg by mouth 2 (two) times daily.     ampicillin 500 MG capsule  Commonly known as:  PRINCIPEN  Take 500 mg by mouth 2 (two) times daily.     ciprofloxacin 500 MG tablet  Commonly known as:  CIPRO  Take 1 tablet (500 mg total) by mouth 2 (two) times daily.     diltiazem 120 MG 24 hr capsule  Commonly known as:  CARDIZEM CD  Take 120 mg by mouth every morning.     doxazosin 8 MG tablet  Commonly known as:  CARDURA  Take 8 mg by mouth every morning.     doxycycline 100 MG capsule  Commonly known as:  VIBRAMYCIN  Take 100 mg by mouth 2 (two) times daily.     feeding supplement (PRO-STAT SUGAR FREE 64) Liqd  Take 30 mLs by mouth daily.     finasteride 5 MG tablet  Commonly known as:   PROSCAR  Take 5 mg by mouth at bedtime.     ipratropium-albuterol 0.5-2.5 (3) MG/3ML Soln  Commonly known as:  DUONEB  Take 3 mLs by nebulization every 6 (six) hours as needed (shortness of breath).     latanoprost 0.005 % ophthalmic solution  Commonly known as:  XALATAN  Place 1 drop into both eyes at bedtime.     metoprolol tartrate 25 MG tablet  Commonly known as:  LOPRESSOR  Take 0.5 tablets (12.5 mg total) by mouth 2 (two) times daily.     promethazine 12.5 MG suppository  Commonly known as:  PHENERGAN  Place 12.5 mg rectally every 6 (six) hours as needed for nausea or vomiting.     sertraline 50 MG tablet  Commonly known as:  ZOLOFT  Take 75 mg by mouth every morning.     warfarin 5 MG tablet  Commonly known as:  COUMADIN  Stop coumadin until INR therapeutic then adjust dosing based on INR     ZINC OXIDE EX  Apply 1 application topically once a week. Infused wrap for legs        Relevant Imaging Results:  Relevant Lab Results:  Recent Labs    Additional Information   SS# [id number] Liliana ClineCaldwell, Jad Johansson Parks, LCSW

## 2015-03-28 NOTE — Anesthesia Postprocedure Evaluation (Signed)
  Anesthesia Post-op Note  Patient: [name]  Procedure(s) Performed: Procedure(s) (LRB): CYSTOSCOPY WITH LITHOLAPAXY (N/A) TRANSURETHRAL RESECTION OF THE PROSTATE  (N/A)  Patient Location: PACU  Anesthesia Type: Spinal  Level of Consciousness: awake and alert   Airway and Oxygen Therapy: Patient Spontanous Breathing  Post-op Pain: mild  Post-op Assessment: Post-op Vital signs reviewed, Patient's Cardiovascular Status Stable, Respiratory Function Stable, Patent Airway and No signs of Nausea or vomiting. Purposeful movement of both legs in PACU. Spinal is receding.  Last Vitals:  Filed Vitals:   03/28/15 1444  BP: 120/53  Pulse: 67  Temp: 36.6 C  Resp: 16    Post-op Vital Signs: stable   Complications: No apparent anesthesia complications

## 2015-03-28 NOTE — NC FL2 (Deleted)
[place] MEDICAID FL2 LEVEL OF CARE SCREENING TOOL     IDENTIFICATION  Patient Name: [name] Birthdate: [birth year] Sex: male Admission Date (Current Location): 03/28/2015  [facility] and [place] Number: Geophysical data processor and Address:  [facility]  [address] [place] [address]      Provider Number: [id number]  Attending Physician Name and Address:  Malen Gauze, MD  Relative Name and Phone Number:       Current Level of Care: Hospital Recommended Level of Care: Skilled Nursing Facility Prior Approval Number:    Date Approved/Denied:   PASRR Number:    Discharge Plan: SNF    Current Diagnoses: Patient Active Problem List   Diagnosis Date Noted  . BPH (benign prostatic hyperplasia) 03/28/2015  . Elevated INR 02/11/2015  . Hydroureteronephrosis 02/11/2015  . Obstructive uropathy 02/10/2015  . Acute renal failure (HCC) 02/10/2015  . Hearing loss of left ear due to cerumen impaction 01/11/2015  . Benign localized hyperplasia of prostate with urinary retention 12/23/2014  . Bladder calculus 12/23/2014  . Pyelonephritis, acute 12/22/2014  . Cardiomyopathy (HCC) 10/15/2014  . Dilated cardiomyopathy (HCC)   . Other emphysema (HCC)   . Venous stasis ulcer (HCC)   . UTI (urinary tract infection) 10/14/2014  . CHF (congestive heart failure) (HCC) 09/26/2013  . Atrial fibrillation with RVR 09/22/2013  . B12 deficiency 09/20/2013  . Weakness 09/18/2013  . Left leg DVT (HCC) 08/03/2013  . Anticoagulation goal of INR 2 to 3 06/01/2013  . Edema-Bilat Leg and foot 03/07/2013  . Chronic venous insufficiency 10/06/2012  . COPD (chronic obstructive pulmonary disease) (HCC) 10/06/2012  . Obesity, unspecified 10/06/2012  . AAA (abdominal aortic aneurysm) without rupture (HCC) 10/06/2012  . Cognitive impairment 10/06/2012  . Multifactorial dementia 10/06/2012  . Peripheral vascular disease (HCC) 09/20/2012    Orientation ACTIVITIES/SOCIAL  BLADDER RESPIRATION    Self, Time, Situation, Place  Active Continent O2 (As needed)  BEHAVIORAL SYMPTOMS/MOOD NEUROLOGICAL BOWEL NUTRITION STATUS      Continent    PHYSICIAN VISITS COMMUNICATION OF NEEDS Height & Weight Skin    Verbally  (200.7 cm) 310 lbs. Normal          AMBULATORY STATUS RESPIRATION    Assist extensive O2 (As needed)      Personal Care Assistance Level of Assistance  Bathing, Dressing Bathing Assistance: Limited assistance   Dressing Assistance: Limited assistance      Functional Limitations Info                SPECIAL CARE FACTORS FREQUENCY  PT (By licensed PT), OT (By licensed OT)     PT Frequency: 5 OT Frequency: 5           Additional Factors Info                  Current Medications (03/28/2015): Current Facility-Administered Medications  Medication Dose Route Frequency Provider Last Rate Last Dose  . 0.9 %  sodium chloride infusion   Intravenous Continuous Malen Gauze, MD 100 mL/hr at 03/28/15 1544    . [START ON 03/29/2015] diltiazem (CARDIZEM CD) 24 hr capsule 120 mg  120 mg Oral q morning - 10a Malen Gauze, MD      . diphenhydrAMINE (BENADRYL) injection 12.5-25 mg  12.5-25 mg Intravenous Q6H PRN Malen Gauze, MD       Or  . diphenhydrAMINE (BENADRYL) 12.5 MG/5ML elixir 12.5-25 mg  12.5-25 mg Oral Q6H PRN Malen Gauze,  MD      . Melene Muller[START ON 03/29/2015] doxazosin (CARDURA) tablet 8 mg  8 mg Oral q morning - 10a Malen GauzePatrick L McKenzie, MD      . finasteride (PROSCAR) tablet 5 mg  5 mg Oral QHS Malen GauzePatrick L McKenzie, MD      . HYDROcodone-acetaminophen (NORCO/VICODIN) 5-325 MG per tablet 1 tablet  1 tablet Oral Q4H PRN Malen GauzePatrick L McKenzie, MD      . HYDROmorphone (DILAUDID) injection 0.5-1 mg  0.5-1 mg Intravenous Q2H PRN Malen GauzePatrick L McKenzie, MD      . ipratropium-albuterol (DUONEB) 0.5-2.5 (3) MG/3ML nebulizer solution 3 mL  3 mL Nebulization Q6H PRN Malen GauzePatrick L McKenzie, MD      . latanoprost (XALATAN) 0.005  % ophthalmic solution 1 drop  1 drop Both Eyes QHS Malen GauzePatrick L McKenzie, MD      . metoprolol tartrate (LOPRESSOR) tablet 12.5 mg  12.5 mg Oral BID Malen GauzePatrick L McKenzie, MD      . ondansetron [facility](ZOFRAN) injection 4 mg  4 mg Intravenous Q4H PRN Malen GauzePatrick L McKenzie, MD      . opium-belladonna (B&O SUPPRETTES) 16.2-60 MG suppository 1 suppository  1 suppository Rectal Q6H PRN Malen GauzePatrick L McKenzie, MD      . polyethylene glycol (MIRALAX / GLYCOLAX) packet 17 g  17 g Oral Daily PRN Malen GauzePatrick L McKenzie, MD      . promethazine (PHENERGAN) suppository 12.5 mg  12.5 mg Rectal Q6H PRN Malen GauzePatrick L McKenzie, MD      . senna [facility](SENOKOT) tablet 8.6 mg  1 tablet Oral BID Malen GauzePatrick L McKenzie, MD      . Melene Muller[START ON 03/29/2015] sertraline (ZOLOFT) tablet 75 mg  75 mg Oral q morning - 10a Malen GauzePatrick L McKenzie, MD       Do not use this list as official medication orders. Please verify with discharge summary.  Discharge Medications:   Medication List    ASK your doctor about these medications        acetaminophen 500 MG tablet  Commonly known as:  TYLENOL  Take 1,000 mg by mouth every 6 (six) hours as needed for mild pain or fever.     amoxicillin 500 MG tablet  Commonly known as:  AMOXIL  Take 500 mg by mouth 2 (two) times daily.     ampicillin 500 MG capsule  Commonly known as:  PRINCIPEN  Take 500 mg by mouth 2 (two) times daily.     ciprofloxacin 500 MG tablet  Commonly known as:  CIPRO  Take 1 tablet (500 mg total) by mouth 2 (two) times daily.     diltiazem 120 MG 24 hr capsule  Commonly known as:  CARDIZEM CD  Take 120 mg by mouth every morning.     doxazosin 8 MG tablet  Commonly known as:  CARDURA  Take 8 mg by mouth every morning.     doxycycline 100 MG capsule  Commonly known as:  VIBRAMYCIN  Take 100 mg by mouth 2 (two) times daily.     feeding supplement (PRO-STAT SUGAR FREE 64) Liqd  Take 30 mLs by mouth daily.     finasteride 5 MG tablet  Commonly known as:  PROSCAR  Take 5 mg by mouth at  bedtime.     ipratropium-albuterol 0.5-2.5 (3) MG/3ML Soln  Commonly known as:  DUONEB  Take 3 mLs by nebulization every 6 (six) hours as needed (shortness of breath).     latanoprost 0.005 % ophthalmic solution  Commonly known as:  XALATAN  Place 1 drop into both eyes at bedtime.     metoprolol tartrate 25 MG tablet  Commonly known as:  LOPRESSOR  Take 0.5 tablets (12.5 mg total) by mouth 2 (two) times daily.     promethazine 12.5 MG suppository  Commonly known as:  PHENERGAN  Place 12.5 mg rectally every 6 (six) hours as needed for nausea or vomiting.     sertraline 50 MG tablet  Commonly known as:  ZOLOFT  Take 75 mg by mouth every morning.     warfarin 5 MG tablet  Commonly known as:  COUMADIN  Stop coumadin until INR therapeutic then adjust dosing based on INR     ZINC OXIDE EX  Apply 1 application topically once a week. Infused wrap for legs        Relevant Imaging Results:  Relevant Lab Results:  Recent Labs    Additional Information    Liliana Cline, LCSW

## 2015-03-28 NOTE — Transfer of Care (Signed)
Immediate Anesthesia Transfer of Care Note  Patient: [name]  Procedure(s) Performed: Procedure(s): CYSTOSCOPY WITH LITHOLAPAXY (N/A) TRANSURETHRAL RESECTION OF THE PROSTATE  (N/A)  Patient Location: PACU  Anesthesia Type:Spinal  Level of Consciousness:  sedated, patient cooperative and responds to stimulation  Airway & Oxygen Therapy:Patient Spontanous Breathing and Patient connected to face mask oxgen  Post-op Assessment:  Report given to PACU RN and Post -op Vital signs reviewed and stable  Post vital signs:  Reviewed and stable  Last Vitals:  Filed Vitals:   03/28/15 0830  BP: 118/49  Pulse: 72  Temp: 36.7 C  Resp: 16    Complications: No apparent anesthesia complications

## 2015-03-28 NOTE — Brief Op Note (Signed)
03/28/2015  1:34 PM  PATIENT:  [name]  81 y.o. male  PRE-OPERATIVE DIAGNOSIS:  BLADDER STONE, BENIGN PROSTATIC HYPERPLASIA  POST-OPERATIVE DIAGNOSIS:  BLADDER STONE, BENIGN PROSTATIC HYPERPLASIA  PROCEDURE:  Procedure(s): CYSTOSCOPY WITH LITHOLAPAXY (N/A) TRANSURETHRAL RESECTION OF THE PROSTATE  (N/A)  SURGEON:  Surgeon(s) and Role:    * Malen GauzePatrick L Shakya Sebring, MD - Primary  PHYSICIAN ASSISTANT:   ASSISTANTS: none   ANESTHESIA:   spinal  EBL:  Total I/O In: 500 [I.V.:500] Out: -   BLOOD ADMINISTERED:none  DRAINS: Urinary Catheter (Foley)   LOCAL MEDICATIONS USED:  NONE  SPECIMEN:  Source of Specimen:  prostate chips, bladder calculi  DISPOSITION OF SPECIMEN:  PATHOLOGY  COUNTS:  YES  TOURNIQUET:  * No tourniquets in log *  DICTATION: .Note written in EPIC  PLAN OF CARE: Admit overnight for observation  PATIENT DISPOSITION:  PACU - hemodynamically stable.   Delay start of Pharmacological VTE agent (>24hrs) due to surgical blood loss or risk of bleeding: not applicable

## 2015-03-28 NOTE — H&P (Signed)
Urology Admission H&P  Chief Complaint: hematuria  History of Present Illness: [name] is a 81yo with a hx of urinary retention and bladder calculus here for TURp and cystolithalopaxy. He has longstanding urinary retnion likely from BPH. He pulled his foley last night and now has hematuria.  Past Medical History  Diagnosis Date  . Venous insufficiency   . AAA (abdominal aortic aneurysm) (HCC)   . Ulcer   . DVT (deep venous thrombosis) (HCC) 05/04/2013    "LLE"  . Cellulitis   . Nicotine dependence   . Bronchospasm   . Elevated PSA   . BPH (benign prostatic hyperplasia)   . COPD (chronic obstructive pulmonary disease) (HCC)   . Cognitive impairment   . Daily headache   . Arthritis     "probably on the right leg/hip" (09/20/2013)  . Dementia     "don't know exactly what kind" (09/20/2013)  . Kidney stones   . Glaucoma, both eyes   . Atrial fibrillation (HCC)   . Fall at home September 18, 2013  . Benign localized hyperplasia of prostate with urinary retention 12/23/2014  . AAA (abdominal aortic aneurysm) without rupture (HCC) 10/06/2012  . Anticoagulation goal of INR 2 to 3 06/01/2013  . B12 deficiency 09/20/2013  . Bladder calculus 12/23/2014    2.6cm and present since at least 2011 when it was about 1.6cm.   . Cardiomyopathy (HCC) 10/15/2014  . CHF (congestive heart failure) (HCC) 09/26/2013  . Chronic venous insufficiency 10/06/2012  . Edema-Bilat Leg and foot 03/07/2013  . Multifactorial dementia 10/06/2012  . Obesity, unspecified 10/06/2012  . Peripheral vascular disease (HCC) 09/20/2012  . Pyelonephritis, acute 12/22/2014  . Weakness 09/18/2013  . Wound cellulitis     let ankle wound size of half dollar per nurse [facility] on 03/25/2015   Past Surgical History  Procedure Laterality Date  . Cataract extraction w/ intraocular lens  implant, bilateral Bilateral   . Insitu percutaneous pinning femur Right 1986  . Hip fracture surgery Right 2009  . Back surgery    . Posterior  laminectomy / decompression lumbar spine  2004    Decompressive laminectomy unilaterally on the right at L4-5 with  . Femur fracture surgery Right 04/1985  . Cystoscopy w/ stone manipulation  1970's    "once"  . Spine surgery    . Eye surgery    . Cholecystectomy  2011    Gall Bladder    Home Medications:  Prescriptions prior to admission  Medication Sig Dispense Refill Last Dose  . acetaminophen (TYLENOL) 500 MG tablet Take 1,000 mg by mouth every 6 (six) hours as needed for mild pain or fever.    03/27/2015 at pm  . Amino Acids-Protein Hydrolys (FEEDING SUPPLEMENT, PRO-STAT SUGAR FREE 64,) LIQD Take 30 mLs by mouth daily.   03/27/2015 at Unknown time  . amoxicillin (AMOXIL) 500 MG tablet Take 500 mg by mouth 2 (two) times daily.   03/27/2015 at 2100  . diltiazem (CARDIZEM CD) 120 MG 24 hr capsule Take 120 mg by mouth every morning.    03/28/2015 at 0630  . doxazosin (CARDURA) 8 MG tablet Take 8 mg by mouth every morning.    03/28/2015 at 0630  . doxycycline (VIBRAMYCIN) 100 MG capsule Take 100 mg by mouth 2 (two) times daily.     . finasteride (PROSCAR) 5 MG tablet Take 5 mg by mouth at bedtime.    03/27/2015 at 2100  . ipratropium-albuterol (DUONEB) 0.5-2.5 (3) MG/3ML SOLN Take  3 mLs by nebulization every 6 (six) hours as needed (shortness of breath).   Past Month at Unknown time  . metoprolol tartrate (LOPRESSOR) 25 MG tablet Take 0.5 tablets (12.5 mg total) by mouth 2 (two) times daily. 30 tablet 0 03/28/2015 at 0630  . sertraline (ZOLOFT) 50 MG tablet Take 75 mg by mouth every morning.    03/27/2015 at 2100  . ZINC OXIDE EX Apply 1 application topically once a week. Infused wrap for legs   03/28/2015 at 0630  . ampicillin (PRINCIPEN) 500 MG capsule Take 500 mg by mouth 2 (two) times daily.   Unknown at Unknown time  . ciprofloxacin (CIPRO) 500 MG tablet Take 1 tablet (500 mg total) by mouth 2 (two) times daily. (Patient not taking: Reported on 03/18/2015) 14 tablet 0   . latanoprost  (XALATAN) 0.005 % ophthalmic solution Place 1 drop into both eyes at bedtime.    Unknown at Unknown time  . promethazine (PHENERGAN) 12.5 MG suppository Place 12.5 mg rectally every 6 (six) hours as needed for nausea or vomiting.   Unknown at Unknown time  . warfarin (COUMADIN) 5 MG tablet Stop coumadin until INR therapeutic then adjust dosing based on INR (Patient taking differently: 4.5 mg. Stop coumadin until INR therapeutic then adjust dosing based on INR)   03/04/2015   Allergies:  Allergies  Allergen Reactions  . Oxycodone Hcl Other (See Comments)     makes pt "wild"  . Propoxyphene N-Acetaminophen Other (See Comments)     Makes pt. "wild"    Family History  Problem Relation Age of Onset  . Aneurysm Brother   . Heart attack Brother     10-29-12  . Hypertension Brother   . Heart disease Brother     AAA  . Cancer Father     colon   Social History:  reports that he quit smoking about 12 years ago. His smoking use included Cigarettes. He has a 108 pack-year smoking history. His smokeless tobacco use includes Snuff. He reports that he does not drink alcohol or use illicit drugs.  Review of Systems  Genitourinary: Positive for dysuria, urgency, frequency and hematuria.  All other systems reviewed and are negative.   Physical Exam:  Vital signs in last 24 hours: Temp:  [98.1 F (36.7 C)] 98.1 F (36.7 C) (10/27 0830) Pulse Rate:  [72] 72 (10/27 0830) Resp:  [16] 16 (10/27 0830) BP: (118)/(49) 118/49 mmHg (10/27 0830) SpO2:  [94 %] 94 % (10/27 0830) Weight:  [131.09 kg (289 lb)-138.801 kg (306 lb)] 131.09 kg (289 lb) (10/27 0840) Physical Exam  Constitutional: He is oriented to person, place, and time. He appears well-developed and well-nourished.  HENT:  Head: Normocephalic and atraumatic.  Eyes: EOM are normal. Pupils are equal, round, and reactive to light.  Neck: Normal range of motion. No thyromegaly present.  Cardiovascular: Regular rhythm.   Respiratory: Effort  normal. No respiratory distress.  GI: Soft. He exhibits no distension.  Musculoskeletal: Normal range of motion.  Neurological: He is alert and oriented to person, place, and time.  Skin: Skin is warm and dry.  Psychiatric: He has a normal mood and affect. His behavior is normal. Judgment and thought content normal.    Laboratory Data:  Results for orders placed or performed during the hospital encounter of 03/28/15 (from the past 24 hour(s))  CBC     Status: Abnormal   Collection Time: 03/28/15  9:10 AM  Result Value Ref Range   WBC 8.5 4.0 -  10.5 K/uL   RBC 3.76 (L) 4.22 - 5.81 MIL/uL   Hemoglobin 10.4 (L) 13.0 - 17.0 g/dL   HCT 16.1 (L) 09.6 - 04.5 %   MCV 88.8 78.0 - 100.0 fL   MCH 27.7 26.0 - 34.0 pg   MCHC 31.1 30.0 - 36.0 g/dL   RDW 40.9 (H) 81.1 - 91.4 %   Platelets 315 150 - 400 K/uL  Protime-INR     Status: Abnormal   Collection Time: 03/28/15  9:10 AM  Result Value Ref Range   Prothrombin Time 16.2 (H) 11.6 - 15.2 seconds   INR 1.29 0.00 - 1.49  APTT     Status: None   Collection Time: 03/28/15  9:10 AM  Result Value Ref Range   aPTT 34 24 - 37 seconds   Recent Results (from the past 240 hour(s))  Culture, Urine     Status: None   Collection Time: 03/18/15  4:50 PM  Result Value Ref Range Status   Specimen Description URINE, CLEAN CATCH  Final   Special Requests NONE  Final   Culture   Final    80,000 COLONIES/ml PROTEUS MIRABILIS Performed at [facility]    Report Status 03/21/2015 FINAL  Final   Organism ID, Bacteria PROTEUS MIRABILIS  Final      Susceptibility   Proteus mirabilis - MIC*    AMPICILLIN <=2 SENSITIVE Sensitive     CEFAZOLIN <=4 SENSITIVE Sensitive     CEFTRIAXONE <=1 SENSITIVE Sensitive     CIPROFLOXACIN 2 INTERMEDIATE Intermediate     GENTAMICIN <=1 SENSITIVE Sensitive     IMIPENEM 2 SENSITIVE Sensitive     NITROFURANTOIN 128 RESISTANT Resistant     TRIMETH/SULFA <=20 SENSITIVE Sensitive     AMPICILLIN/SULBACTAM <=2 SENSITIVE  Sensitive     PIP/TAZO <=4 SENSITIVE Sensitive     * 80,000 COLONIES/ml PROTEUS MIRABILIS   Creatinine: No results for input(s): CREATININE in the last 168 hours. Baseline Creatinine: unknown  Impression/Assessment:  81yo with BPH, urinary retention, gross hematuria, bladder culculus  Plan:  The risks/benefits/alternatives to TURP and cystolithalopaxy was explained to the patient and family and they understand and wish to proceed with surgery  Lyza Houseworth L 03/28/2015, 11:09 AM

## 2015-03-28 NOTE — Progress Notes (Signed)
Patient arrived at hospital with both lower extremities wrapped.  Per patient daughter these wraps are changed once a week [facility]. She stated he has a wound on his left ankle.   Patient does not want wraps to be taken off.

## 2015-03-29 ENCOUNTER — Inpatient Hospital Stay
Admission: RE | Admit: 2015-03-29 | Discharge: 2016-02-17 | Disposition: A | Discharge status: Other Acute Inpatient Hospital | Payer: Medicare Other | Source: Ambulatory Visit | Attending: Internal Medicine | Admitting: Internal Medicine

## 2015-03-29 DIAGNOSIS — N401 Enlarged prostate with lower urinary tract symptoms: Secondary | ICD-10-CM | POA: Diagnosis not present

## 2015-03-29 DIAGNOSIS — R627 Adult failure to thrive: Secondary | ICD-10-CM | POA: Diagnosis not present

## 2015-03-29 DIAGNOSIS — Z7901 Long term (current) use of anticoagulants: Secondary | ICD-10-CM | POA: Diagnosis not present

## 2015-03-29 DIAGNOSIS — I4891 Unspecified atrial fibrillation: Secondary | ICD-10-CM | POA: Diagnosis not present

## 2015-03-29 DIAGNOSIS — M1990 Unspecified osteoarthritis, unspecified site: Secondary | ICD-10-CM | POA: Diagnosis not present

## 2015-03-29 DIAGNOSIS — N210 Calculus in bladder: Secondary | ICD-10-CM | POA: Diagnosis not present

## 2015-03-29 DIAGNOSIS — Z6834 Body mass index (BMI) 34.0-34.9, adult: Secondary | ICD-10-CM | POA: Diagnosis not present

## 2015-03-29 DIAGNOSIS — R05 Cough: Principal | ICD-10-CM

## 2015-03-29 DIAGNOSIS — I482 Chronic atrial fibrillation: Secondary | ICD-10-CM | POA: Diagnosis not present

## 2015-03-29 DIAGNOSIS — Z79899 Other long term (current) drug therapy: Secondary | ICD-10-CM | POA: Diagnosis not present

## 2015-03-29 DIAGNOSIS — I429 Cardiomyopathy, unspecified: Secondary | ICD-10-CM | POA: Diagnosis not present

## 2015-03-29 DIAGNOSIS — Z86718 Personal history of other venous thrombosis and embolism: Secondary | ICD-10-CM | POA: Diagnosis not present

## 2015-03-29 DIAGNOSIS — F0390 Unspecified dementia without behavioral disturbance: Secondary | ICD-10-CM | POA: Diagnosis not present

## 2015-03-29 DIAGNOSIS — I739 Peripheral vascular disease, unspecified: Secondary | ICD-10-CM | POA: Diagnosis not present

## 2015-03-29 DIAGNOSIS — J449 Chronic obstructive pulmonary disease, unspecified: Secondary | ICD-10-CM | POA: Diagnosis not present

## 2015-03-29 DIAGNOSIS — I714 Abdominal aortic aneurysm, without rupture: Secondary | ICD-10-CM | POA: Diagnosis not present

## 2015-03-29 DIAGNOSIS — E669 Obesity, unspecified: Secondary | ICD-10-CM | POA: Diagnosis not present

## 2015-03-29 DIAGNOSIS — R059 Cough, unspecified: Principal | ICD-10-CM

## 2015-03-29 DIAGNOSIS — Z9049 Acquired absence of other specified parts of digestive tract: Secondary | ICD-10-CM | POA: Diagnosis not present

## 2015-03-29 DIAGNOSIS — Z9181 History of falling: Secondary | ICD-10-CM | POA: Diagnosis not present

## 2015-03-29 DIAGNOSIS — R338 Other retention of urine: Secondary | ICD-10-CM | POA: Diagnosis not present

## 2015-03-29 DIAGNOSIS — H409 Unspecified glaucoma: Secondary | ICD-10-CM | POA: Diagnosis not present

## 2015-03-29 DIAGNOSIS — F17290 Nicotine dependence, other tobacco product, uncomplicated: Secondary | ICD-10-CM | POA: Diagnosis not present

## 2015-03-29 DIAGNOSIS — I5032 Chronic diastolic (congestive) heart failure: Secondary | ICD-10-CM | POA: Diagnosis not present

## 2015-03-29 LAB — CBC
HCT: 28.9 % — ABNORMAL LOW (ref 39.0–52.0)
Hemoglobin: 9.1 g/dL — ABNORMAL LOW (ref 13.0–17.0)
MCH: 28.1 pg (ref 26.0–34.0)
MCHC: 31.5 g/dL (ref 30.0–36.0)
MCV: 89.2 fL (ref 78.0–100.0)
PLATELETS: 292 10*3/uL (ref 150–400)
RBC: 3.24 MIL/uL — ABNORMAL LOW (ref 4.22–5.81)
RDW: 16.9 % — AB (ref 11.5–15.5)
WBC: 8.6 10*3/uL (ref 4.0–10.5)

## 2015-03-29 LAB — BASIC METABOLIC PANEL
Anion gap: 6 (ref 5–15)
BUN: 21 mg/dL — AB (ref 6–20)
CALCIUM: 8.8 mg/dL — AB (ref 8.9–10.3)
CO2: 28 mmol/L (ref 22–32)
Chloride: 110 mmol/L (ref 101–111)
Creatinine, Ser: 1.18 mg/dL (ref 0.61–1.24)
GFR calc Af Amer: >60 mL/min (ref >60)
GFR, EST NON AFRICAN AMERICAN: 56 mL/min — AB (ref >60)
GLUCOSE: 96 mg/dL (ref 65–99)
Potassium: 4.3 mmol/L (ref 3.5–5.1)
Sodium: 144 mmol/L (ref 135–145)

## 2015-03-29 MED ORDER — HYDROCODONE-ACETAMINOPHEN 5-325 MG PO TABS: 1.0000 | ORAL_TABLET | ORAL | Status: DC | PRN

## 2015-03-29 NOTE — Discharge Summary (Signed)
Physician Discharge Summary  Patient ID: [name] MRN: [id number] DOB/AGE: 1931-09-05 81 y.o.  Admit date: 03/28/2015 Discharge date: 03/29/2015  Admission Diagnoses: BPH, bladder calculus  Discharge Diagnoses:  Active Problems:   BPH (benign prostatic hyperplasia)   Discharged Condition: good  Hospital Course: The patient tolerated the procedure well and was transferred to the floor on IV pain meds, IV fluid. On POD#1 pt was started on regular diet. Prior to discharge the pt was tolerating a regular diet, pain was controlled on PO pain meds, they were ambulating without difficulty, and they had normal bowel function. He was discharged home with foley in place  Consults: None  Significant Diagnostic Studies: none  Treatments: surgery: cystolithalopaxy and TURP  Discharge Exam: Blood pressure 111/45, pulse 79, temperature 99.2 F (37.3 C), temperature source Oral, resp. rate 20, height  (2.007 m), weight 140.615 kg (310 lb), SpO2 95 %. General appearance: alert, cooperative and appears stated age Head: Normocephalic, without obvious abnormality, atraumatic Eyes: conjunctivae/corneas clear. PERRL, EOM's intact. Fundi benign. Resp: clear to auscultation bilaterally GI: soft, non-tender; bowel sounds normal; no masses,  no organomegaly Male genitalia: normal, penis: no lesions or discharge. testes: no masses or tenderness. no hernias Neurologic: Alert and oriented X 3, normal strength and tone. Normal symmetric reflexes. Normal coordination and gait  Disposition: 04-Intermediate Care Facility     Medication List    TAKE these medications        acetaminophen 500 MG tablet  Commonly known as:  TYLENOL  Take 1,000 mg by mouth every 6 (six) hours as needed for mild pain or fever.     amoxicillin 500 MG tablet  Commonly known as:  AMOXIL  Take 500 mg by mouth 2 (two) times daily.     ampicillin 500 MG capsule  Commonly known as:  PRINCIPEN  Take 500 mg by mouth 2  (two) times daily.     ciprofloxacin 500 MG tablet  Commonly known as:  CIPRO  Take 1 tablet (500 mg total) by mouth 2 (two) times daily.     diltiazem 120 MG 24 hr capsule  Commonly known as:  CARDIZEM CD  Take 120 mg by mouth every morning.     doxazosin 8 MG tablet  Commonly known as:  CARDURA  Take 8 mg by mouth every morning.     doxycycline 100 MG capsule  Commonly known as:  VIBRAMYCIN  Take 100 mg by mouth 2 (two) times daily.     feeding supplement (PRO-STAT SUGAR FREE 64) Liqd  Take 30 mLs by mouth daily.     finasteride 5 MG tablet  Commonly known as:  PROSCAR  Take 5 mg by mouth at bedtime.     HYDROcodone-acetaminophen 5-325 MG tablet  Commonly known as:  NORCO/VICODIN  Take 1 tablet by mouth every 4 (four) hours as needed for moderate pain.     ipratropium-albuterol 0.5-2.5 (3) MG/3ML Soln  Commonly known as:  DUONEB  Take 3 mLs by nebulization every 6 (six) hours as needed (shortness of breath).     latanoprost 0.005 % ophthalmic solution  Commonly known as:  XALATAN  Place 1 drop into both eyes at bedtime.     metoprolol tartrate 25 MG tablet  Commonly known as:  LOPRESSOR  Take 0.5 tablets (12.5 mg total) by mouth 2 (two) times daily.     promethazine 12.5 MG suppository  Commonly known as:  PHENERGAN  Place 12.5 mg rectally every 6 (six) hours as needed  for nausea or vomiting.     sertraline 50 MG tablet  Commonly known as:  ZOLOFT  Take 75 mg by mouth every morning.     warfarin 5 MG tablet  Commonly known as:  COUMADIN  Stop coumadin until INR therapeutic then adjust dosing based on INR     ZINC OXIDE EX  Apply 1 application topically once a week. Infused wrap for legs           Follow-up Information    Follow up with Malen GauzeMCKENZIE, Cloud Graham L, MD. Call on 04/03/2015.   Specialty:  Urology   Why:  voiding trial   Contact information:   [address] [place] [address] [phone]       Signed: Malen GauzeMCKENZIE, Ivory Bail  L 03/29/2015, 11:05 AM

## 2015-03-29 NOTE — Progress Notes (Signed)
Pt was outpatient in bed overnight and is a resident at [facility].   Pt ready for discharge back to [facility] today.   CSW met with pt and pt spouse at bedside and confirmed plan to return to [facility]. Pt wife reported that pt transported to hospital by [employer] and pt wheelchair in pt room.   CSW contacted [facility] and confirmed facility can accept pt back today. [facility] stated that facility will arrange transport via [facility] for pt pick up at hospital. CSW received confirmation from [facility] that [employer] will pick pt up at hospital at 2 pm.   CSW notified pt and pt wife at bedside. CSW notified RN and nurse tech about transport arriving at [facility] provided discharge packet to pt wife to provide to [facility] upon arrival.   No further social work needs identified at this time.  CSW signing off.   Alison Murray, MSW, [place] Work [phone]

## 2015-03-29 NOTE — Progress Notes (Signed)
Report called to [facility]. Conley Canalhonda Lain RN accepting report for facility. VSS No acute changes noted with Pt's assessment at time of discharge

## 2015-03-30 ENCOUNTER — Other Ambulatory Visit (HOSPITAL_COMMUNITY)
Admission: RE | Admit: 2015-03-30 | Discharge: 2015-03-30 | Disposition: A | Discharge status: Home / Self Care | Payer: Medicare Other | Source: Skilled Nursing Facility | Attending: Internal Medicine | Admitting: Internal Medicine

## 2015-03-30 LAB — CBC WITH DIFFERENTIAL/PLATELET
BASOS ABS: 0 10*3/uL (ref 0.0–0.1)
BASOS PCT: 1 %
Eosinophils Absolute: 1.1 10*3/uL — ABNORMAL HIGH (ref 0.0–0.7)
Eosinophils Relative: 12 %
HEMATOCRIT: 32.8 % — AB (ref 39.0–52.0)
HEMOGLOBIN: 10.1 g/dL — AB (ref 13.0–17.0)
Lymphocytes Relative: 24 %
Lymphs Abs: 2.1 10*3/uL (ref 0.7–4.0)
MCH: 28 pg (ref 26.0–34.0)
MCHC: 30.8 g/dL (ref 30.0–36.0)
MCV: 90.9 fL (ref 78.0–100.0)
Monocytes Absolute: 1.2 10*3/uL — ABNORMAL HIGH (ref 0.1–1.0)
Monocytes Relative: 14 %
NEUTROS ABS: 4.3 10*3/uL (ref 1.7–7.7)
NEUTROS PCT: 50 %
Platelets: 268 10*3/uL (ref 150–400)
RBC: 3.61 MIL/uL — AB (ref 4.22–5.81)
RDW: 17 % — ABNORMAL HIGH (ref 11.5–15.5)
WBC: 8.7 10*3/uL (ref 4.0–10.5)

## 2015-03-30 LAB — BASIC METABOLIC PANEL
ANION GAP: 6 (ref 5–15)
BUN: 21 mg/dL — ABNORMAL HIGH (ref 6–20)
CALCIUM: 8.9 mg/dL (ref 8.9–10.3)
CO2: 28 mmol/L (ref 22–32)
Chloride: 109 mmol/L (ref 101–111)
Creatinine, Ser: 1.15 mg/dL (ref 0.61–1.24)
GFR, EST NON AFRICAN AMERICAN: 58 mL/min — AB (ref >60)
Glucose, Bld: 100 mg/dL — ABNORMAL HIGH (ref 65–99)
Potassium: 4.2 mmol/L (ref 3.5–5.1)
Sodium: 143 mmol/L (ref 135–145)

## 2015-03-30 LAB — PROTIME-INR
INR: 1.28 (ref 0.00–1.49)
Prothrombin Time: 16.2 seconds — ABNORMAL HIGH (ref 11.6–15.2)

## 2015-04-01 ENCOUNTER — Other Ambulatory Visit: Payer: Self-pay | Admitting: *Deleted

## 2015-04-01 MED ORDER — HYDROCODONE-ACETAMINOPHEN 5-325 MG PO TABS: ORAL_TABLET | ORAL | Status: DC

## 2015-04-01 NOTE — Telephone Encounter (Signed)
[facility] 

## 2015-04-01 NOTE — Op Note (Signed)
Preoperative diagnosis: BPH, urinary retention, bladder calculus  Postoperative diagnosis: same  Procedure: 1 cystoscopy 2. Transurethral resection of the prostate 3. cystolithalopaxy for a stone >2.5cm  Attending: Wilkie AyePatrick Mckenzie  Anesthesia: General  Estimated blood loss: Minimal  Drains: 22 French foley  Specimens: 1. Prostate Chips 2. Bladder calculus  Antibiotics: zosyn  Findings: Trilobar prostate enlargement. Ureteral orifices in normal anatomic location. 3.5cm bladder calculus embedded at trigone  Indications: Patient is a 81 year old male with a history of BPH, urinary retention, and a large bladder calculus.  After discussing treatment options, they decided proceed with transurethral resection of the prostate and cystolithalopaxy.  Procedure her in detail: The patient was brought to the operating room and a brief timeout was done to ensure correct patient, correct procedure, correct site.  General anesthesia was administered patient was placed in dorsal lithotomy position.  Their genitalia was then prepped and draped in usual sterile fashion.  A rigid 22 French cystoscope was passed in the urethra and the bladder.  Bladder was inspected and we noted no masses or lesions.  the ureteral orifices were in the normal orthotopic locations. removed the cystoscope and placed a nephroscope into the bladder. Using the pneumatic stone breaker the bladder calculus was fragmented and the fragments were removed through the sheath. Once all the fragments were removed we turned our attention to the prostate resection. THe nephroscope was then replaced with the resectoscope. Using the bipolar resectoscope we resected the median lobe first from the bladder neck to the verumontanum. We then started at the 12 oclock position on the left lobe and resection to the 6 o'clock position from the bladder neck to the verumontanum. We then did the same resection of the right lobe. Once the resection was  complete we then cauterized individual bleeders. We then removed the prostate chips and sent them for pathology.  We then re-inspected the prostatic fossa and found no residual bleeding.  the bladder was then drained, a 22 French foley was placed and this concluded the procedure which was well tolerated by patient.  Complications: None  Condition: Stable, extubated, transferred to PACU  Plan: Patient is admitted overnight with continuous bladder irrigation. If their urine is clear tomorrow they will be discharged home and followup in 5 days for foley catheter removal and pathology discussion.

## 2015-04-03 ENCOUNTER — Ambulatory Visit (INDEPENDENT_AMBULATORY_CARE_PROVIDER_SITE_OTHER): Payer: Self-pay | Admitting: Urology

## 2015-04-03 DIAGNOSIS — R351 Nocturia: Secondary | ICD-10-CM

## 2015-04-03 DIAGNOSIS — N210 Calculus in bladder: Secondary | ICD-10-CM

## 2015-04-03 DIAGNOSIS — N401 Enlarged prostate with lower urinary tract symptoms: Secondary | ICD-10-CM

## 2015-04-09 ENCOUNTER — Other Ambulatory Visit (HOSPITAL_COMMUNITY)
Admission: AD | Admit: 2015-04-09 | Discharge: 2015-04-09 | Disposition: A | Discharge status: Home / Self Care | Payer: Medicare Other | Source: Skilled Nursing Facility | Attending: Internal Medicine | Admitting: Internal Medicine

## 2015-04-09 DIAGNOSIS — R3989 Other symptoms and signs involving the genitourinary system: Secondary | ICD-10-CM | POA: Diagnosis not present

## 2015-04-09 LAB — URINALYSIS, ROUTINE W REFLEX MICROSCOPIC
Bilirubin Urine: NEGATIVE
GLUCOSE, UA: NEGATIVE mg/dL
Ketones, ur: NEGATIVE mg/dL
Nitrite: POSITIVE — AB
PH: 6 (ref 5.0–8.0)
Specific Gravity, Urine: 1.025 (ref 1.005–1.030)
Urobilinogen, UA: 1 mg/dL (ref 0.0–1.0)

## 2015-04-09 LAB — URINE MICROSCOPIC-ADD ON

## 2015-04-12 LAB — URINE CULTURE

## 2015-04-16 ENCOUNTER — Encounter: Payer: Self-pay | Admitting: Internal Medicine

## 2015-04-16 ENCOUNTER — Non-Acute Institutional Stay (SKILLED_NURSING_FACILITY): Payer: Medicare Other | Admitting: Internal Medicine

## 2015-04-16 DIAGNOSIS — I482 Chronic atrial fibrillation, unspecified: Secondary | ICD-10-CM

## 2015-04-16 DIAGNOSIS — N10 Acute tubulo-interstitial nephritis: Secondary | ICD-10-CM

## 2015-04-16 DIAGNOSIS — I429 Cardiomyopathy, unspecified: Secondary | ICD-10-CM | POA: Diagnosis not present

## 2015-04-16 DIAGNOSIS — J42 Unspecified chronic bronchitis: Secondary | ICD-10-CM | POA: Diagnosis not present

## 2015-04-16 NOTE — Progress Notes (Signed)
Patient ID: [name], male   DOB: 1932-05-30, 81 y.o.   MRN: [id number]  Facility [facility] This is an acute visit  Chief complaint; acute visit secondary to UTI-follow-up atrial fibrillation on chronic anticoagulation with Coumadin .  History of present illness.  Patient is a pleasant 81 year old with a complex medical history--including COPD atrial fibrillation with history of DVT on chronic anticoagulation as well as obstructive uropathy.  He recently had a TURP procedure done on October 27 by urology apparently tolerated this well.  His Coumadin was put on hold and currently still on hold I suspect we can restart this per discussion with Dr. Leanord Hawkingobson.  Clinically appears to be doing well he did get a urine culture recently which per culture results today shows has grown out MRSA-he is currently on amoxicillin empirically but MRSA is resistant to this.  It is sensitive to several antibiotics including gentamicin and vancomycin inducible clindamycin Macrobid and rifampin.  Currently he is relatively asymptomatic no fever chills or complaints of dysuria he does have an indwelling Foley catheter with dark amber colored urine he has been afebrile He does not complain of back pain  Labs.  03/30/2015.  Sodium 143 potassium 4.2 BUN 21 creatinine 1.15.  WBC 8.7 hemoglobin 10.1 platelets 268.    BMP Latest Ref Rng 03/18/2015 02/16/2015 02/14/2015  Glucose 65 - 99 mg/dL 409(W103(H) 96 119(J108(H)  BUN 6 - 20 mg/dL 47(W23(H) 29(F23(H) 62(Z34(H)  Creatinine 0.61 - 1.24 mg/dL 3.08(M1.60(H) 5.78(I1.26(H) 6.96(E1.50(H)  BUN/Creat Ratio 10 - 22 - - -  Sodium 135 - 145 mmol/L 146(H) 145 146(H)  Potassium 3.5 - 5.1 mmol/L 4.3 3.5 3.8  Chloride 101 - 111 mmol/L 112(H) 114(H) 113(H)  CO2 22 - 32 mmol/L 29 27 26   Calcium 8.9 - 10.3 mg/dL 9.1 9.5(M8.2(L) 8.4(X8.7(L)   Family medical social history reviewed per admission note on 12/28/2014  Past Medical History  Diagnosis Date  . Venous insufficiency   . AAA (abdominal aortic aneurysm)    . Ulcer   . DVT (deep venous thrombosis) 05/04/2013    "LLE"  . Cellulitis   . Nicotine dependence   . Bronchospasm   . Elevated PSA   . BPH (benign prostatic hyperplasia)   . COPD (chronic obstructive pulmonary disease)   . Cognitive impairment   . Daily headache   . Arthritis     "probably on the right leg/hip" (09/20/2013)  . Dementia     "don't know exactly what kind" (09/20/2013)  . Kidney stones   . Glaucoma, both eyes   . Atrial fibrillation   . Fall at home September 18, 2013  . Benign localized hyperplasia of prostate with urinary retention 12/23/2014  . AAA (abdominal aortic aneurysm) without rupture 10/06/2012  . Anticoagulation goal of INR 2 to 3 06/01/2013  . B12 deficiency 09/20/2013  . Bladder calculus 12/23/2014    2.6cm and present since at least 2011 when it was about 1.6cm.   . Cardiomyopathy 10/15/2014  . CHF (congestive heart failure) 09/26/2013  . Chronic venous insufficiency 10/06/2012  . Edema-Bilat Leg and foot 03/07/2013  . Multifactorial dementia 10/06/2012  . Obesity, unspecified 10/06/2012  . Peripheral vascular disease 09/20/2012  . Pyelonephritis, acute 12/22/2014  . Weakness 09/18/2013    [facility] and they include.  Cardizem 120 mg extended release daily.  Cardura 8 mg daily.  Proscar 5 mg daily.  Duo nebs 4 times a day when necessary.  Lopressor 12.5 mg twice a day.  Zoloft  75 mg daily.  Vicodin 5-3 25 mg every 4 hours when necessary.   Review of systems  Somewhat limited since patient is a poor historian secondary to dementia.  General no completes of fever chills.  Head ears eyes nose mouth and throat does not complain of sore throat or nasal discharge.  Respiratory is not complaining of any shortness of breath or cough.  Cardiac no chest pain has significant venous stasis lower extremities but this appears to be baseline stable hospital he slightly improved from my last exam.  GI does not complain of nausea vomiting  diarrhea constipation or abdominal pain.  GU extensive history as noted above is not complaining of dysuria however appears she does have a UTI.  Musculoskeletal has lower extremity weakness which is baseline does not complain of joint pain today.  Neurologic does not complain of dizziness or headaches.    Physical examination He is afebrile pulse of 62 respirations 20 blood pressure 141/52-128/86 in this range most recently weight is 296.9 this appears to be at the lower end of his baseline he was 304 approximately a month ago Gen. patient is not in any distress sitting comfortably in his chair  Respiratory poor air entry   mildexpiratory wheezing --no labored breathing cardiac Heart sounds  regular irregular---rate  controlled he appears to be euvolemic Abdomen obese no tenderness  GU Foley catheter in place--with dark amber colored urine-- no  Overt  CVA tenderness Extremities severe bilateral venous insufficiency--legs are currently wrapped Neurologic is grossly intact speech is clear.  Psych -- he has mild dementia he is pleasant conversant follow simple verbal commands with no difficulty  .   Impressions/plan  #1 UTI-positive for MRSA-we are seeking urology import-this was discussed with Dr. Leanord Hawking via phone suspect Zyvox would be a reasonable alternative-however there may be cost issues which will have to be sorted out---he will need antibiotic expediently Also will check a CBC with differential in the morning  Atrial fibrillation; Again he is off Coumadin status post procedure-will restart this at 5 mg a day and recheck INR on Friday rate appears to be controlled on beta blocker This will have to be watched especially since he will be on an antibiotic   #3 COPD this is significant but stable. Continue nebulizers  #4 history of CHF I don't see any of this currently.  #5 chronic renal failure.  Recent creatinine of 1.15 appears relatively baseline Will update  this.  [id number]

## 2015-04-17 ENCOUNTER — Non-Acute Institutional Stay (SKILLED_NURSING_FACILITY): Payer: Medicare Other | Admitting: Internal Medicine

## 2015-04-17 ENCOUNTER — Encounter (HOSPITAL_COMMUNITY)
Admission: RE | Admit: 2015-04-17 | Discharge: 2015-04-17 | Disposition: A | Discharge status: Home / Self Care | Payer: Medicare Other | Source: Skilled Nursing Facility | Attending: Internal Medicine | Admitting: Internal Medicine

## 2015-04-17 DIAGNOSIS — Z7901 Long term (current) use of anticoagulants: Secondary | ICD-10-CM | POA: Diagnosis present

## 2015-04-17 DIAGNOSIS — A4902 Methicillin resistant Staphylococcus aureus infection, unspecified site: Secondary | ICD-10-CM | POA: Diagnosis not present

## 2015-04-17 DIAGNOSIS — I10 Essential (primary) hypertension: Secondary | ICD-10-CM | POA: Diagnosis present

## 2015-04-17 DIAGNOSIS — I714 Abdominal aortic aneurysm, without rupture: Secondary | ICD-10-CM | POA: Insufficient documentation

## 2015-04-17 DIAGNOSIS — J449 Chronic obstructive pulmonary disease, unspecified: Secondary | ICD-10-CM

## 2015-04-17 DIAGNOSIS — I482 Chronic atrial fibrillation: Secondary | ICD-10-CM | POA: Diagnosis present

## 2015-04-17 DIAGNOSIS — N10 Acute tubulo-interstitial nephritis: Secondary | ICD-10-CM | POA: Diagnosis not present

## 2015-04-17 DIAGNOSIS — I739 Peripheral vascular disease, unspecified: Secondary | ICD-10-CM | POA: Insufficient documentation

## 2015-04-17 DIAGNOSIS — I82402 Acute embolism and thrombosis of unspecified deep veins of left lower extremity: Secondary | ICD-10-CM | POA: Diagnosis present

## 2015-04-17 DIAGNOSIS — I5032 Chronic diastolic (congestive) heart failure: Secondary | ICD-10-CM | POA: Diagnosis present

## 2015-04-17 LAB — CBC WITH DIFFERENTIAL/PLATELET
BASOS ABS: 0 10*3/uL (ref 0.0–0.1)
BASOS PCT: 1 %
EOS PCT: 15 %
Eosinophils Absolute: 1.2 10*3/uL — ABNORMAL HIGH (ref 0.0–0.7)
HCT: 32.4 % — ABNORMAL LOW (ref 39.0–52.0)
Hemoglobin: 9.9 g/dL — ABNORMAL LOW (ref 13.0–17.0)
Lymphocytes Relative: 25 %
Lymphs Abs: 2 10*3/uL (ref 0.7–4.0)
MCH: 27.4 pg (ref 26.0–34.0)
MCHC: 30.6 g/dL (ref 30.0–36.0)
MCV: 89.8 fL (ref 78.0–100.0)
MONO ABS: 1 10*3/uL (ref 0.1–1.0)
Monocytes Relative: 12 %
NEUTROS ABS: 3.9 10*3/uL (ref 1.7–7.7)
Neutrophils Relative %: 48 %
PLATELETS: 299 10*3/uL (ref 150–400)
RBC: 3.61 MIL/uL — ABNORMAL LOW (ref 4.22–5.81)
RDW: 16.9 % — AB (ref 11.5–15.5)
WBC: 8.1 10*3/uL (ref 4.0–10.5)

## 2015-04-17 LAB — COMPREHENSIVE METABOLIC PANEL
ALBUMIN: 3 g/dL — AB (ref 3.5–5.0)
ALT: 10 U/L — ABNORMAL LOW (ref 17–63)
AST: 15 U/L (ref 15–41)
Alkaline Phosphatase: 77 U/L (ref 38–126)
Anion gap: 5 (ref 5–15)
BUN: 21 mg/dL — AB (ref 6–20)
CHLORIDE: 108 mmol/L (ref 101–111)
CO2: 28 mmol/L (ref 22–32)
CREATININE: 0.83 mg/dL (ref 0.61–1.24)
Calcium: 8.9 mg/dL (ref 8.9–10.3)
GFR calc Af Amer: >60 mL/min (ref >60)
GFR calc non Af Amer: >60 mL/min (ref >60)
GLUCOSE: 100 mg/dL — AB (ref 65–99)
POTASSIUM: 4.3 mmol/L (ref 3.5–5.1)
Sodium: 141 mmol/L (ref 135–145)
Total Bilirubin: 0.6 mg/dL (ref 0.3–1.2)
Total Protein: 6.6 g/dL (ref 6.5–8.1)

## 2015-04-17 NOTE — Progress Notes (Signed)
Patient ID: [name], male   DOB: 19-Dec-1931, 81 y.o.   MRN: [id number]    Facility; [facility] Chief complaint; urinary retention/MRSA in urine culture. History; this is a patient who has had chronic problems with urinary retention followed by urology. He has BPH and a history of bladder calculi. He went earlier this month for a cystoscopy, TURP and ;lithlapaxy. He was sent back here for a voiding trial where he promptly did not void. There were orders for in and out catheter and a bladder scan showed more than 500 cc. He seems 11 that up with a chronic Foley catheter. Urine culture done at this time shows a very resistant MRSA in particular resistant to tetracyclines, sulfonamides. It is sensitive to nitrofurantoin 1 however he has renal insufficiency stage III. It is not really clear to me that this is overtly symptomatic but given his recent urinary manipulation I am concerned about leaving this untreated.  Past Medical History  Diagnosis Date  . Venous insufficiency   . AAA (abdominal aortic aneurysm) (HCC)   . Ulcer   . DVT (deep venous thrombosis) (HCC) 05/04/2013    "LLE"  . Cellulitis   . Nicotine dependence   . Bronchospasm   . Elevated PSA   . BPH (benign prostatic hyperplasia)   . COPD (chronic obstructive pulmonary disease) (HCC)   . Cognitive impairment   . Daily headache   . Arthritis     "probably on the right leg/hip" (09/20/2013)  . Dementia     "don't know exactly what kind" (09/20/2013)  . Kidney stones   . Glaucoma, both eyes   . Atrial fibrillation (HCC)   . Fall at home September 18, 2013  . Benign localized hyperplasia of prostate with urinary retention 12/23/2014  . AAA (abdominal aortic aneurysm) without rupture (HCC) 10/06/2012  . Anticoagulation goal of INR 2 to 3 06/01/2013  . B12 deficiency 09/20/2013  . Bladder calculus 12/23/2014    2.6cm and present since at least 2011 when it was about 1.6cm.   . Cardiomyopathy (HCC) 10/15/2014  . CHF (congestive  heart failure) (HCC) 09/26/2013  . Chronic venous insufficiency 10/06/2012  . Edema-Bilat Leg and foot 03/07/2013  . Multifactorial dementia 10/06/2012  . Obesity, unspecified 10/06/2012  . Peripheral vascular disease (HCC) 09/20/2012  . Pyelonephritis, acute 12/22/2014  . Weakness 09/18/2013  . Wound cellulitis     let ankle wound size of half dollar per nurse at [facility] on 03/25/2015   Current medications Tylenol 502 tablets 6 hours as needed for pain Cardiac exam CDE 120 a day Cardura 8 mg daily Finasteride 5 mg at bedtime Metoprolol 12.5 twice a day Xalatan at 10 ophthalmic Zoloft 75 once a day Duonebs 4 times a day when necessary Hydrocodone/acetaminophen 5/325 one tablet every 4 Unfortunately his Coumadin was only restarted yesterday.  Physical examination Gen. the patient looks stable Respiratory really significant expiratory wheezing he is not in any respiratory distress. Cardiac heart sounds are regular pulse rate 62 Abdomen no liver no spleen no tenderness no masses GU I suspect there is suprapubic tenderness but no costovertebral angle tenderness Extremities his legs are wrapped significant venous stasis physiology with inflammation and recurrent ulceration.  Impression/plan #1; MRSA in his urine culture at the time of urinary retention with recent urinary manipulation. I cannot totally determine whether this patient is symptomatic or not however I think it is probably going to need to be treated that. There are not a lot of  oral options here and I'm going to put him on Zyvox for 1 week. #2 on chronic Coumadin there is no drug interaction with Zyvox I checked this area. Not withstanding his PT and INR should be followed carefully #3 COPD he is wheezing. I'm going to put him on routine Duonebs for the next several days #4 multifactorial urinary retention is follow-up in July is on 12/7. His urinary retention is long-standing.

## 2015-04-19 ENCOUNTER — Encounter (HOSPITAL_COMMUNITY)
Admission: AD | Admit: 2015-04-19 | Discharge: 2015-04-19 | Disposition: A | Discharge status: Home / Self Care | Payer: Medicare Other | Source: Skilled Nursing Facility | Attending: Internal Medicine | Admitting: Internal Medicine

## 2015-04-19 DIAGNOSIS — I739 Peripheral vascular disease, unspecified: Secondary | ICD-10-CM | POA: Diagnosis not present

## 2015-04-19 DIAGNOSIS — I714 Abdominal aortic aneurysm, without rupture: Secondary | ICD-10-CM | POA: Diagnosis not present

## 2015-04-19 LAB — PROTIME-INR
INR: 1.21 (ref 0.00–1.49)
PROTHROMBIN TIME: 15.5 s — AB (ref 11.6–15.2)

## 2015-04-22 ENCOUNTER — Encounter (HOSPITAL_COMMUNITY)
Admission: RE | Admit: 2015-04-22 | Discharge: 2015-04-22 | Disposition: A | Discharge status: Home / Self Care | Payer: Medicare Other | Source: Skilled Nursing Facility | Attending: Internal Medicine | Admitting: Internal Medicine

## 2015-04-22 DIAGNOSIS — I714 Abdominal aortic aneurysm, without rupture: Secondary | ICD-10-CM | POA: Diagnosis not present

## 2015-04-22 DIAGNOSIS — I739 Peripheral vascular disease, unspecified: Secondary | ICD-10-CM | POA: Diagnosis not present

## 2015-04-22 LAB — PROTIME-INR
INR: 1.27 (ref 0.00–1.49)
PROTHROMBIN TIME: 16 s — AB (ref 11.6–15.2)

## 2015-04-24 ENCOUNTER — Ambulatory Visit (INDEPENDENT_AMBULATORY_CARE_PROVIDER_SITE_OTHER): Payer: Self-pay | Admitting: Urology

## 2015-04-24 DIAGNOSIS — N401 Enlarged prostate with lower urinary tract symptoms: Secondary | ICD-10-CM

## 2015-04-24 DIAGNOSIS — R351 Nocturia: Secondary | ICD-10-CM

## 2015-04-29 ENCOUNTER — Encounter (HOSPITAL_COMMUNITY)
Admission: RE | Admit: 2015-04-29 | Discharge: 2015-04-29 | Disposition: A | Discharge status: Home / Self Care | Payer: Medicare Other | Source: Skilled Nursing Facility | Attending: Internal Medicine | Admitting: Internal Medicine

## 2015-04-29 DIAGNOSIS — I714 Abdominal aortic aneurysm, without rupture: Secondary | ICD-10-CM | POA: Diagnosis not present

## 2015-04-29 DIAGNOSIS — I739 Peripheral vascular disease, unspecified: Secondary | ICD-10-CM | POA: Diagnosis not present

## 2015-04-29 LAB — PROTIME-INR
INR: 1.88 — AB (ref 0.00–1.49)
PROTHROMBIN TIME: 21.5 s — AB (ref 11.6–15.2)

## 2015-05-06 ENCOUNTER — Encounter (HOSPITAL_COMMUNITY)
Admission: RE | Admit: 2015-05-06 | Discharge: 2015-05-06 | Disposition: A | Discharge status: Home / Self Care | Payer: Medicare Other | Source: Skilled Nursing Facility | Attending: Internal Medicine | Admitting: Internal Medicine

## 2015-05-06 DIAGNOSIS — I739 Peripheral vascular disease, unspecified: Secondary | ICD-10-CM | POA: Diagnosis not present

## 2015-05-06 DIAGNOSIS — Z7901 Long term (current) use of anticoagulants: Secondary | ICD-10-CM | POA: Diagnosis not present

## 2015-05-06 DIAGNOSIS — D649 Anemia, unspecified: Secondary | ICD-10-CM | POA: Insufficient documentation

## 2015-05-06 LAB — PROTIME-INR
INR: 2.45 — AB (ref 0.00–1.49)
Prothrombin Time: 26.3 seconds — ABNORMAL HIGH (ref 11.6–15.2)

## 2015-05-09 ENCOUNTER — Other Ambulatory Visit (HOSPITAL_COMMUNITY)
Admission: RE | Admit: 2015-05-09 | Discharge: 2015-05-09 | Disposition: A | Discharge status: Home / Self Care | Payer: Medicare Other | Source: Skilled Nursing Facility | Attending: Internal Medicine | Admitting: Internal Medicine

## 2015-05-09 DIAGNOSIS — Z87440 Personal history of urinary (tract) infections: Secondary | ICD-10-CM | POA: Diagnosis not present

## 2015-05-12 LAB — URINE CULTURE

## 2015-05-13 ENCOUNTER — Encounter (HOSPITAL_COMMUNITY)
Admission: RE | Admit: 2015-05-13 | Discharge: 2015-05-13 | Disposition: A | Discharge status: Home / Self Care | Payer: Medicare Other | Source: Skilled Nursing Facility | Attending: Internal Medicine | Admitting: Internal Medicine

## 2015-05-13 ENCOUNTER — Non-Acute Institutional Stay (SKILLED_NURSING_FACILITY): Payer: Medicare Other | Admitting: Internal Medicine

## 2015-05-13 DIAGNOSIS — A4902 Methicillin resistant Staphylococcus aureus infection, unspecified site: Secondary | ICD-10-CM

## 2015-05-13 DIAGNOSIS — N390 Urinary tract infection, site not specified: Secondary | ICD-10-CM | POA: Diagnosis not present

## 2015-05-13 DIAGNOSIS — T83511A Infection and inflammatory reaction due to indwelling urethral catheter, initial encounter: Secondary | ICD-10-CM | POA: Diagnosis not present

## 2015-05-13 DIAGNOSIS — I739 Peripheral vascular disease, unspecified: Secondary | ICD-10-CM | POA: Diagnosis not present

## 2015-05-13 DIAGNOSIS — Z7901 Long term (current) use of anticoagulants: Secondary | ICD-10-CM | POA: Diagnosis not present

## 2015-05-13 DIAGNOSIS — D649 Anemia, unspecified: Secondary | ICD-10-CM | POA: Diagnosis not present

## 2015-05-13 LAB — PROTIME-INR
INR: 3.03 — AB (ref 0.00–1.49)
Prothrombin Time: 30.9 seconds — ABNORMAL HIGH (ref 11.6–15.2)

## 2015-05-15 ENCOUNTER — Non-Acute Institutional Stay (SKILLED_NURSING_FACILITY): Payer: Medicare Other | Admitting: Internal Medicine

## 2015-05-15 ENCOUNTER — Encounter: Payer: Self-pay | Admitting: Internal Medicine

## 2015-05-15 DIAGNOSIS — J42 Unspecified chronic bronchitis: Secondary | ICD-10-CM | POA: Diagnosis not present

## 2015-05-15 DIAGNOSIS — I482 Chronic atrial fibrillation, unspecified: Secondary | ICD-10-CM

## 2015-05-15 DIAGNOSIS — Z7901 Long term (current) use of anticoagulants: Secondary | ICD-10-CM

## 2015-05-15 DIAGNOSIS — N139 Obstructive and reflux uropathy, unspecified: Secondary | ICD-10-CM

## 2015-05-15 DIAGNOSIS — I5032 Chronic diastolic (congestive) heart failure: Secondary | ICD-10-CM | POA: Diagnosis not present

## 2015-05-15 NOTE — Progress Notes (Signed)
Patient ID: [name], male   DOB: 09-Aug-1931, 81 y.o.   MRN: [id number]                PROGRESS NOTE  DATE:  05/13/2015        FACILITY: [facility]                   LEVEL OF CARE:   SNF   Acute Visit                  CHIEF COMPLAINT:  Urinary retention, drug-resistant MRSA in urine.     HISTORY OF PRESENT ILLNESS:  I saw this patient on 04/07/2015 for pretty much the same issue.  He has a history of BPH and bladder calculi.  He came back here in mid March for a voiding trial after cystoscopy, TURP, and lithotripsy.  He promptly failed this.  The urine culture at that time showed a very resistant MRSA and particularly resistant to tetracyclines and sulfonamides.  I gave him a course of Zyvox.    Apparently, the facility recently changed his catheter and a urine culture was ordered.  I am not sure of the rationale for this.  However, the urine culture has come back showing the same resistant MRSA.  The patient, for his part, is completely asymptomatic with no fever, no chills, no suprapubic or flank pain.  He has already been started on nitrofurantoin 100 b.i.d. for 10 days.    REVIEW OF SYSTEMS:    CHEST/RESPIRATORY:  No cough.  No sputum.    CARDIAC:  No chest pain.   GI:  No nausea, vomiting, or diarrhea.            GU:  Foley catheter.       PHYSICAL EXAMINATION:   GENERAL APPEARANCE:  The patient looks well.     CHEST/RESPIRATORY:  Shallow, but otherwise clear air entry, compatible with his known COPD.      CARDIOVASCULAR:   CARDIAC:  Heart sounds are normal.  JVP is not elevated.      GASTROINTESTINAL:   ABDOMEN:  Nontender.  No masses.     GENITOURINARY:   BLADDER:  No suprapubic or costovertebral angle tenderness.    ASSESSMENT/PLAN:                Chronic urinary colonization with MRSA in the setting of chronic Foley catheter placement.  I had treated this same organism a month ago.  However, the circumstances were different in that the patient had just  recently been instrumented and had surgery.  As far as I am aware, there is no rule for screening asymptomatic patients for bacteriuria, nor is treatment of the MRSA currently indicated.  The Foley catheter has been changed.  I do not believe there is an actual indication to treat this.  The fact that this is a resistant Staph aureus does not really change the fact that he is completely asymptomatic clinically.  Furthermore, using nitrofurantoin in this setting with some degree of chronic renal insufficiency probably will not work.      CPT CODE: [id number]             ADDENDUM:  His INR today is 3.03 on 6 mg of Coumadin.  I am going to change him to 5.5 alternating with 6.  Follow up in one week.

## 2015-05-15 NOTE — Progress Notes (Signed)
Patient ID: [name], male   DOB: 1931-11-05, 81 y.o.   MRN: [id number]   Facility [facility] This is a routine visit.  Level care skilled.  Chief complaint --medical management of chronic medical conditions including obstructive uropathy-atrial fibrillation-diastolic CHF-COPD-acute visit secondary to right legerythema .  History of present illness.  Patient is a pleasant 81 year old with a complex medical history--including COPD atrial fibrillation with history of DVT  afib on chronic anticoagulation as well as obstructive uropathy.  He recently had a TURP procedure done on October 27 by urology apparently tolerated this well.   He continues a chronic indwelling Foley catheter.  At times he will get a urine culture done he did have a couple recently that grew out MRSA initially this was treated with Zyvox.  He was relatively asymptomatic during this.  At some point another urine culture was recently obtained which came back on December 8 positive again for MRSA-he was empirically put on Macrobid-Dr. Leanord Hawking did address this later however and since patient was essentially asymptomatic discontinued this.  In this regards patient appears to be stable he is followed by urology.  Nursing staff today has noted some increased erythema mid right lower leg and I am following up on this-he does have some previous history of cellulitis here been treated at one point on doxycycline and apparently this helped.  Patient appears to have a history of diastolic CHF this appears stable he is not on a diuretic currently nonetheless his weights and edema appears to be relatively stable he does have significant venous stasis issues bilaterally.  Regards to atrial fibrillation this appears rate controlled he is on the metoprolol on Coumadin for anticoagulation his INR was slightly elevated at 3.03 earlier this week Dr. Leanord Hawking did decrease his Coumadin slightly to alternating doses of 5- 6 mg a day we will  need to update his INR tomorrow especially since I suspect we will be putting him on an antibiotic.  In regards see other issues is depression appears to be stable on Zoloft.  Hypertension appears stable recent blood pressures 122/57-126/57-.  He does have a history of anemia most likely chronic disease most recent hemoglobin 9.9 last month appear to be at his baseline.  He also has periods renal insufficiency this is thought probably secondary to his obstructive uropathy when he has issues-actually raising creatinine of 0.83 and BUN of 21 appears to be stable for him this was done last month as well we will update this.  .    Labs.  04/17/2015.  WBC 8.1 hemoglobin 9.9 platelets 299.  Sodium 141 potassium 4.3 BUN 21 creatinine 0.3.  Albumin 3.0 ALT 10 otherwise liver function tests within normal limits  03/30/2015.  Sodium 143 potassium 4.2 BUN 21 creatinine 1.15.  WBC 8.7 hemoglobin 10.1 platelets 268.    BMP Latest Ref Rng 03/18/2015 02/16/2015 02/14/2015  Glucose 65 - 99 mg/dL 409(W) 96 119(J)  BUN 6 - 20 mg/dL 47(W) 29(F) 62(Z)  Creatinine 0.61 - 1.24 mg/dL 3.08(M) 5.78(I) 6.96(E)  BUN/Creat Ratio 10 - 22 - - -  Sodium 135 - 145 mmol/L 146(H) 145 146(H)  Potassium 3.5 - 5.1 mmol/L 4.3 3.5 3.8  Chloride 101 - 111 mmol/L 112(H) 114(H) 113(H)  CO2 22 - 32 mmol/L Calcium 8.9 - 10.3 mg/dL 9.1 9.5(M) 8.4(X)   Family medical social history reviewed per admission note on 12/28/2014  Past Medical History  Diagnosis Date  . Venous insufficiency   . AAA (  abdominal aortic aneurysm)   . Ulcer   . DVT (deep venous thrombosis) 05/04/2013    "LLE"  . Cellulitis   . Nicotine dependence   . Bronchospasm   . Elevated PSA   . BPH (benign prostatic hyperplasia)   . COPD (chronic obstructive pulmonary disease)   . Cognitive impairment   . Daily headache   . Arthritis     "probably on the right leg/hip" (09/20/2013)  . Dementia     "don't know exactly what kind"  (09/20/2013)  . Kidney stones   . Glaucoma, both eyes   . Atrial fibrillation   . Fall at home September 18, 2013  . Benign localized hyperplasia of prostate with urinary retention 12/23/2014  . AAA (abdominal aortic aneurysm) without rupture 10/06/2012  . Anticoagulation goal of INR 2 to 3 06/01/2013  . B12 deficiency 09/20/2013  . Bladder calculus 12/23/2014    2.6cm and present since at least 2011 when it was about 1.6cm.   . Cardiomyopathy 10/15/2014  . CHF (congestive heart failure) 09/26/2013  . Chronic venous insufficiency 10/06/2012  . Edema-Bilat Leg and foot 03/07/2013  . Multifactorial dementia 10/06/2012  . Obesity, unspecified 10/06/2012  . Peripheral vascular disease 09/20/2012  . Pyelonephritis, acute 12/22/2014  . Weakness 09/18/2013    Medications reviewed per [facility] and they include.  Cardizem 120 mg extended release daily.  Cardura 8 mg daily.  Proscar 5 mg daily.  Duo nebs 4 times a day when necessary.  Lopressor 12.5 mg twice a day.  Zoloft 75 mg daily.  Vicodin 5-3 25 mg every 4 hours when   Coumadin alternating doses of 5 mg and 6 mg daily.  Proscar 5 mg daily.  .   Review of systems  Somewhat limited since patient is a poor historian secondary to dementia.  General no completes of fever chills.  Skin does have significant venous stasis changes with scaling lower extremities does have some increased erythema right mid lower leg as noted above  Head ears eyes nose mouth and throat does not complain of sore throat or nasal discharge.  Respiratory is not complaining of any shortness of breath or cough.  Cardiac no chest pain has significant venous stasis lower extremities but this appears to be baseline stable   GI does not complain of nausea vomiting diarrhea constipation or abdominal pain.  GU extensive history as noted above is not complaining of dysuria however --or back pain.  Musculoskeletal has lower extremity weakness which is baseline does not complain  of joint pain today.  Neurologic does not complain of dizziness or headaches.    Physical examination  Temperature is 97.7 pulse 66 respirations 20 blood pressure 122/57 his weight is 300.5 this appears relatively baseline appears she's been about this weight since the beginning of the month he was 296 at one point in mid-November however I see he has been as high as 305 a couple months ago as well In general- patient is not in any distress sitting comfortably in his chair Skin is warm and dry he does have scaling venous stasis changes lower legs bilaterally is left leg is wrapped-right leg mid shin area anterior area there is approximately a 5 cm in diameter erythematous band this is not circumferential however--it does cover the anterior half of his leg however this is somewhat warm to touch does not appear to be acutely tender.  Eyes pupils appear reactive to light visual acuity appears grossly intact.  Oropharynx clear mucous membranes moist.  Respiratory poor air entry   mildexpiratory wheezingposterior lung fields --no labored breathing cardiac Heart sounds  regular irregular---rate  controlled he appears to be euvolemic Abdomen obese no tenderness slightly hypoactive bowel sounds  GU Foley catheter in place--with dark amber colored urine-- no  Overt  CVA tenderness Extremities severe bilateral venous insufficiency--left legcurrently wrapped--right leg does have venous stasis scaling changes again has the erythematous band as noted above there is a positive pedal pulse --edema actually appears somewhat improved from what I have seen previously Neurologic is grossly intact speech is clear.  Psych -- he has mild dementia he is pleasant conversant follow simple verbal commands with no difficulty  .   Impressions/plan  #1  History of obstructive uropathy he is followed by urology currently has catheter placedAt times he has grown out MRSA but this appears to be asymptomatic this  has been assessed by Dr. Leanord Hawkingobson at this point will monitor for any symptoms back pain fever chills dysuria complaints he does not appear to have any of these today  Atrial fibrillation;  This appears rate controlled he is on metoprolol as well as Cardizem he is on Coumadin for anticoagulation INR was slightly supratherapeutic earlier this week and Coumadin doses been titrated down will update an INR tomorrow will have to keep a close eye on this since he is about to be started on an antibiotic for suspected cellulitis right lower leg.     #3 COPD this is significant but stable. Continue nebulizers--apparently he has tried to refuse this this was discussed with patient today to continue these to maintain his respiratory status  #4 history of CHF I don't see any of this currently. Weights have been stable and not on a diuretic-this will have to be watched  #5 chronic renal failure.  Recent creatinine of 0.83 appears to be stable for him will update this.  #6 suspected cellulitis right lower leg will treat with doxycycline 100 mg twice a day for 7 days and monitor-again will have to keep a close eye on his INR since he is on Coumadin-.  #7 depression this appears stable on Zoloft.  # #8 hypertension this appears stable as noted above he is on Lopressor Cardizem as well as Cardura  #9 anemia-this appears relatively baseline per chart review it 9.9 suspect there is element of chronic disease here we will update this for follow-up.  #10 history DVT-again he is on chronic anticoagulation with Coumadin INR is being monitored.  #11 history of abdominal aortic aneurysm-this has been followed by vascular-I do note he had a CT scan done in July 2016 which showed an unchanged 4.4 cm in diameter AAA-recommendation to repeat an ultrasound in 1 year  Regards pain issues he does continue on Vicodin every 4 hours when necessary 5-3 25 mg this appears to be effective he is not complaining of pain  today.  [id number]-of note greater than 40 minutes spent assessing patient-reviewing his chart-and coordinating and formulating a plan of care for numerous diagnoses-note greater than 50% of time spent coordinating plan of care

## 2015-05-16 ENCOUNTER — Encounter (HOSPITAL_COMMUNITY)
Admission: AD | Admit: 2015-05-16 | Discharge: 2015-05-16 | Disposition: A | Discharge status: Home / Self Care | Payer: Medicare Other | Source: Skilled Nursing Facility | Attending: Internal Medicine | Admitting: Internal Medicine

## 2015-05-16 DIAGNOSIS — Z7901 Long term (current) use of anticoagulants: Secondary | ICD-10-CM | POA: Diagnosis not present

## 2015-05-16 DIAGNOSIS — I739 Peripheral vascular disease, unspecified: Secondary | ICD-10-CM | POA: Diagnosis not present

## 2015-05-16 DIAGNOSIS — D649 Anemia, unspecified: Secondary | ICD-10-CM | POA: Diagnosis not present

## 2015-05-16 LAB — CBC WITH DIFFERENTIAL/PLATELET
Basophils Absolute: 0.1 10*3/uL (ref 0.0–0.1)
Basophils Relative: 1 %
EOS PCT: 9 %
Eosinophils Absolute: 1.1 10*3/uL — ABNORMAL HIGH (ref 0.0–0.7)
HEMATOCRIT: 30.7 % — AB (ref 39.0–52.0)
HEMOGLOBIN: 9.6 g/dL — AB (ref 13.0–17.0)
LYMPHS PCT: 14 %
Lymphs Abs: 1.7 10*3/uL (ref 0.7–4.0)
MCH: 27.2 pg (ref 26.0–34.0)
MCHC: 31.3 g/dL (ref 30.0–36.0)
MCV: 87 fL (ref 78.0–100.0)
Monocytes Absolute: 1.7 10*3/uL — ABNORMAL HIGH (ref 0.1–1.0)
Monocytes Relative: 15 %
NEUTROS ABS: 7.2 10*3/uL (ref 1.7–7.7)
Neutrophils Relative %: 61 %
Platelets: 295 10*3/uL (ref 150–400)
RBC: 3.53 MIL/uL — ABNORMAL LOW (ref 4.22–5.81)
RDW: 17 % — ABNORMAL HIGH (ref 11.5–15.5)
WBC: 11.6 10*3/uL — ABNORMAL HIGH (ref 4.0–10.5)

## 2015-05-16 LAB — BASIC METABOLIC PANEL
Anion gap: 5 (ref 5–15)
BUN: 22 mg/dL — AB (ref 6–20)
CO2: 28 mmol/L (ref 22–32)
Calcium: 8.8 mg/dL — ABNORMAL LOW (ref 8.9–10.3)
Chloride: 109 mmol/L (ref 101–111)
Creatinine, Ser: 1.02 mg/dL (ref 0.61–1.24)
GFR calc Af Amer: >60 mL/min (ref >60)
GLUCOSE: 100 mg/dL — AB (ref 65–99)
POTASSIUM: 4.1 mmol/L (ref 3.5–5.1)
Sodium: 142 mmol/L (ref 135–145)

## 2015-05-16 LAB — PROTIME-INR
INR: 2.64 — AB (ref 0.00–1.49)
Prothrombin Time: 27.8 seconds — ABNORMAL HIGH (ref 11.6–15.2)

## 2015-05-19 ENCOUNTER — Other Ambulatory Visit (HOSPITAL_COMMUNITY)
Admission: RE | Admit: 2015-05-19 | Discharge: 2015-05-19 | Disposition: A | Discharge status: Home / Self Care | Payer: Medicare Other | Source: Skilled Nursing Facility | Attending: Internal Medicine | Admitting: Internal Medicine

## 2015-05-19 DIAGNOSIS — I5032 Chronic diastolic (congestive) heart failure: Secondary | ICD-10-CM | POA: Insufficient documentation

## 2015-05-19 DIAGNOSIS — I4891 Unspecified atrial fibrillation: Secondary | ICD-10-CM | POA: Diagnosis not present

## 2015-05-19 LAB — CBC WITH DIFFERENTIAL/PLATELET
BASOS ABS: 0.1 10*3/uL (ref 0.0–0.1)
Basophils Relative: 1 %
Eosinophils Absolute: 1.1 10*3/uL — ABNORMAL HIGH (ref 0.0–0.7)
Eosinophils Relative: 11 %
HEMATOCRIT: 32.4 % — AB (ref 39.0–52.0)
HEMOGLOBIN: 10.2 g/dL — AB (ref 13.0–17.0)
LYMPHS PCT: 20 %
Lymphs Abs: 1.9 10*3/uL (ref 0.7–4.0)
MCH: 27.4 pg (ref 26.0–34.0)
MCHC: 31.5 g/dL (ref 30.0–36.0)
MCV: 87.1 fL (ref 78.0–100.0)
Monocytes Absolute: 1.2 10*3/uL — ABNORMAL HIGH (ref 0.1–1.0)
Monocytes Relative: 13 %
NEUTROS ABS: 5.2 10*3/uL (ref 1.7–7.7)
Neutrophils Relative %: 55 %
Platelets: 298 10*3/uL (ref 150–400)
RBC: 3.72 MIL/uL — AB (ref 4.22–5.81)
RDW: 16.6 % — ABNORMAL HIGH (ref 11.5–15.5)
WBC: 9.5 10*3/uL (ref 4.0–10.5)

## 2015-05-19 LAB — PROTIME-INR
INR: 3.33 — ABNORMAL HIGH (ref 0.00–1.49)
Prothrombin Time: 33.1 seconds — ABNORMAL HIGH (ref 11.6–15.2)

## 2015-05-20 ENCOUNTER — Encounter (HOSPITAL_COMMUNITY)
Admission: RE | Admit: 2015-05-20 | Discharge: 2015-05-20 | Disposition: A | Discharge status: Home / Self Care | Payer: Medicare Other | Source: Skilled Nursing Facility | Attending: Internal Medicine | Admitting: Internal Medicine

## 2015-05-20 DIAGNOSIS — Z7901 Long term (current) use of anticoagulants: Secondary | ICD-10-CM | POA: Diagnosis not present

## 2015-05-20 DIAGNOSIS — D649 Anemia, unspecified: Secondary | ICD-10-CM | POA: Diagnosis not present

## 2015-05-20 DIAGNOSIS — I739 Peripheral vascular disease, unspecified: Secondary | ICD-10-CM | POA: Diagnosis not present

## 2015-05-20 LAB — PROTIME-INR
INR: 2.63 — AB (ref 0.00–1.49)
Prothrombin Time: 27.7 seconds — ABNORMAL HIGH (ref 11.6–15.2)

## 2015-05-20 LAB — CBC WITH DIFFERENTIAL/PLATELET
Basophils Absolute: 0.1 10*3/uL (ref 0.0–0.1)
Basophils Relative: 1 %
Eosinophils Absolute: 1.1 10*3/uL — ABNORMAL HIGH (ref 0.0–0.7)
Eosinophils Relative: 15 %
HEMATOCRIT: 31.8 % — AB (ref 39.0–52.0)
HEMOGLOBIN: 9.8 g/dL — AB (ref 13.0–17.0)
LYMPHS ABS: 1.6 10*3/uL (ref 0.7–4.0)
Lymphocytes Relative: 21 %
MCH: 26.7 pg (ref 26.0–34.0)
MCHC: 30.8 g/dL (ref 30.0–36.0)
MCV: 86.6 fL (ref 78.0–100.0)
MONO ABS: 1 10*3/uL (ref 0.1–1.0)
MONOS PCT: 13 %
NEUTROS ABS: 4 10*3/uL (ref 1.7–7.7)
NEUTROS PCT: 52 %
Platelets: 308 10*3/uL (ref 150–400)
RBC: 3.67 MIL/uL — ABNORMAL LOW (ref 4.22–5.81)
RDW: 16.6 % — AB (ref 11.5–15.5)
WBC: 7.8 10*3/uL (ref 4.0–10.5)

## 2015-05-22 ENCOUNTER — Non-Acute Institutional Stay (SKILLED_NURSING_FACILITY): Payer: Medicare Other | Admitting: Internal Medicine

## 2015-05-22 DIAGNOSIS — I4891 Unspecified atrial fibrillation: Secondary | ICD-10-CM | POA: Diagnosis not present

## 2015-05-22 DIAGNOSIS — L03115 Cellulitis of right lower limb: Secondary | ICD-10-CM

## 2015-05-22 DIAGNOSIS — Z7901 Long term (current) use of anticoagulants: Secondary | ICD-10-CM

## 2015-05-22 NOTE — Progress Notes (Signed)
Patient ID: [name], male   DOB: Dec 31, 1931, 81 y.o.   MRN: [id number]    Facility [facility] This is an acute  visit.  Level care skilled.  Chief complaint --acute visit follow-up right lower leg erythema with suspicion for cellulitis  .  History of present illness.  Patient is a pleasant 81 year old with a complex medical history--including COPD atrial fibrillation with history of DVT  afib on chronic anticoagulation as well as obstructive uropathy He has significant venous stasis changes on his lower extremities with edema-he developed some increased erythema on his right lower leg recently had warmth as well and he was started empirically on doxycycline he is completing one-week course of this-per nursing l is looking better but the erythema is persisting somewhatshe is afebrile he does not really complain of any increased pain there is no fever.  He is on chronic Coumadin with a history of atrial fibrillation -INR is therapeutic at 2.63 on lab done on December 19 and updated INR is pending on Friday, December 23-we are trying to keep close eye on this secondary to his being on an antibiotic.       .    Labs.  05/20/2015.  WBC 7.8 hemoglobin 9.8 platelets 308.  INR 2.63.  05/16/2015.  Sodium 142 potassium 4.1 BUN 22 creatinine 1.6 to  04/17/2015.  WBC 8.1 hemoglobin 9.9 platelets 299.  Sodium 141 potassium 4.3 BUN 21 creatinine 0.3.  Albumin 3.0 ALT 10 otherwise liver function tests within normal limits  03/30/2015.  Sodium 143 potassium 4.2 BUN 21 creatinine 1.15.  WBC 8.7 hemoglobin 10.1 platelets 268.    BMP Latest Ref Rng 03/18/2015 02/16/2015 02/14/2015  Glucose 65 - 99 mg/dL 782(N) 96 562(Z)  BUN 6 - 20 mg/dL 30(Q) 65(H) 84(O)  Creatinine 0.61 - 1.24 mg/dL 9.62(X) 5.28(U) 1.32(G)  BUN/Creat Ratio 10 - 22 - - -  Sodium 135 - 145 mmol/L 146(H) 145 146(H)  Potassium 3.5 - 5.1 mmol/L 4.3 3.5 3.8  Chloride 101 - 111 mmol/L 112(H) 114(H) 113(H)  CO2  22 - 32 mmol/L Calcium 8.9 - 10.3 mg/dL 9.1 4.0(N) 0.2(V)   Family medical social history reviewed per admission note on 12/28/2014  Past Medical History  Diagnosis Date  . Venous insufficiency   . AAA (abdominal aortic aneurysm)   . Ulcer   . DVT (deep venous thrombosis) 05/04/2013    "LLE"  . Cellulitis   . Nicotine dependence   . Bronchospasm   . Elevated PSA   . BPH (benign prostatic hyperplasia)   . COPD (chronic obstructive pulmonary disease)   . Cognitive impairment   . Daily headache   . Arthritis     "probably on the right leg/hip" (09/20/2013)  . Dementia     "don't know exactly what kind" (09/20/2013)  . Kidney stones   . Glaucoma, both eyes   . Atrial fibrillation   . Fall at home September 18, 2013  . Benign localized hyperplasia of prostate with urinary retention 12/23/2014  . AAA (abdominal aortic aneurysm) without rupture 10/06/2012  . Anticoagulation goal of INR 2 to 3 06/01/2013  . B12 deficiency 09/20/2013  . Bladder calculus 12/23/2014    2.6cm and present since at least 2011 when it was about 1.6cm.   . Cardiomyopathy 10/15/2014  . CHF (congestive heart failure) 09/26/2013  . Chronic venous insufficiency 10/06/2012  . Edema-Bilat Leg and foot 03/07/2013  . Multifactorial dementia 10/06/2012  . Obesity, unspecified 10/06/2012  .  Peripheral vascular disease 09/20/2012  . Pyelonephritis, acute 12/22/2014  . Weakness 09/18/2013    [facility] and [facility].  Cardizem 120 mg extended release daily.  Cardura 8 mg daily.  Proscar 5 mg daily.  Duo nebs 4 times a day when necessary.  Lopressor 12.5 mg twice a day.  Zoloft 75 mg daily.  Vicodin 5-3 25 mg every 4 hours when   Coumadin alternating doses of 5 mg and 6 mg daily.  Proscar 5 mg daily  He is receiving doxycycline 100 mg twice a day.  .   Review of systems  Somewhat limited since patient is a poor historian secondary to dementia.  General no completes of fever  chills.  Skin does have significant venous stasis changes with scaling lower extremities does have some  erythema right mid lower leg as noted above  Head ears eyes nose mouth and throat does not complain of sore throat or nasal discharge.  Respiratory is not complaining of any shortness of breath or cough.  Cardiac no chest pain has significant venous stasis lower extremities but this appears to be baseline stable   GI does not complain of nausea vomiting diarrhea constipation or abdominal pain.  GU extensive history as noted above is not complaining of dysuria however --or back pain.  Musculoskeletal has lower extremity weakness which is baseline does not complain of joint pain today.  Neurologic does not complain of dizziness or headaches.    Physical examination  He is afebrile pulse of 80 respirations 18   In general- patient is not in any distress sitting comfortably in his chair Skin is warm and dry he does have scaling venous stasis changes lower legs bilaterally -right leg mid shin area anterior area there is approximately a 3- cm in diameter erythematous band this is not circumferential however--it does cover the anterior of his leg however this is somewhat warm to touch does not appear to be acutely tender--this actually appears to be improving compared to previous exam.  Eyes pupils appear reactive to light visual acuity appears grossly intact.  Oropharynx clear mucous membranes moist.    Respiratory poor air entry   mildexpiratory wheezingposterior lung fields --no labored breathing  cardiac Heart sounds  regular irregular---rate  controlled he appears to be euvolemic   Neurologic is grossly intact speech is clear.  Psych -- he has mild dementia he is pleasant conversant follow simple verbal commands with no difficulty  .   Impressions/plan  Right lower leg cellulitis-this appears to be getting better he is completing a weeklong course of doxycycline will  extend this for 5 more days this appears to be slowly resolving    Atrial fibrillation;  This appears rate controlled he is on metoprolol as well as Cardizem he is on Coumadin for anticoagulation INR  Is therapeutic at 2.63 update INR is pending on Friday, December 23-again this will have to be monitored closely since he is on an antibiotic        #3 chronic renal failure.  Recent creatinine of 1.02 appears to be stable     [id number]

## 2015-05-24 ENCOUNTER — Encounter (HOSPITAL_COMMUNITY)
Admission: AD | Admit: 2015-05-24 | Discharge: 2015-05-24 | Disposition: A | Discharge status: Home / Self Care | Payer: Medicare Other | Source: Skilled Nursing Facility | Attending: Internal Medicine | Admitting: Internal Medicine

## 2015-05-24 DIAGNOSIS — D649 Anemia, unspecified: Secondary | ICD-10-CM | POA: Diagnosis not present

## 2015-05-24 DIAGNOSIS — Z7901 Long term (current) use of anticoagulants: Secondary | ICD-10-CM | POA: Diagnosis not present

## 2015-05-24 DIAGNOSIS — I739 Peripheral vascular disease, unspecified: Secondary | ICD-10-CM | POA: Diagnosis not present

## 2015-05-24 LAB — PROTIME-INR
INR: 2.21 — AB (ref 0.00–1.49)
Prothrombin Time: 24.3 seconds — ABNORMAL HIGH (ref 11.6–15.2)

## 2015-05-27 ENCOUNTER — Encounter (HOSPITAL_COMMUNITY)
Admission: RE | Admit: 2015-05-27 | Discharge: 2015-05-27 | Disposition: A | Discharge status: Home / Self Care | Payer: Medicare Other | Source: Skilled Nursing Facility | Attending: Internal Medicine | Admitting: Internal Medicine

## 2015-05-27 DIAGNOSIS — D649 Anemia, unspecified: Secondary | ICD-10-CM | POA: Diagnosis not present

## 2015-05-27 DIAGNOSIS — I739 Peripheral vascular disease, unspecified: Secondary | ICD-10-CM | POA: Diagnosis not present

## 2015-05-27 DIAGNOSIS — Z7901 Long term (current) use of anticoagulants: Secondary | ICD-10-CM | POA: Diagnosis not present

## 2015-05-27 LAB — PROTIME-INR
INR: 2.28 — ABNORMAL HIGH (ref 0.00–1.49)
Prothrombin Time: 24.9 seconds — ABNORMAL HIGH (ref 11.6–15.2)

## 2015-06-03 ENCOUNTER — Encounter (HOSPITAL_COMMUNITY)
Admission: RE | Admit: 2015-06-03 | Discharge: 2015-06-03 | Disposition: A | Discharge status: Home / Self Care | Payer: Medicare Other | Source: Skilled Nursing Facility | Attending: Internal Medicine | Admitting: Internal Medicine

## 2015-06-03 DIAGNOSIS — Z7901 Long term (current) use of anticoagulants: Secondary | ICD-10-CM | POA: Insufficient documentation

## 2015-06-03 DIAGNOSIS — I482 Chronic atrial fibrillation: Secondary | ICD-10-CM | POA: Insufficient documentation

## 2015-06-03 LAB — PROTIME-INR
INR: 2.23 — ABNORMAL HIGH (ref 0.00–1.49)
PROTHROMBIN TIME: 24.5 s — AB (ref 11.6–15.2)

## 2015-06-17 ENCOUNTER — Encounter (HOSPITAL_COMMUNITY)
Admission: RE | Admit: 2015-06-17 | Discharge: 2015-06-17 | Disposition: A | Discharge status: Home / Self Care | Payer: Medicare Other | Source: Skilled Nursing Facility | Attending: Internal Medicine | Admitting: Internal Medicine

## 2015-06-17 DIAGNOSIS — Z7901 Long term (current) use of anticoagulants: Secondary | ICD-10-CM | POA: Diagnosis not present

## 2015-06-17 DIAGNOSIS — I482 Chronic atrial fibrillation: Secondary | ICD-10-CM | POA: Diagnosis not present

## 2015-06-17 LAB — PROTIME-INR
INR: 2.88 — AB (ref 0.00–1.49)
PROTHROMBIN TIME: 29.7 s — AB (ref 11.6–15.2)

## 2015-06-20 ENCOUNTER — Encounter (HOSPITAL_COMMUNITY)
Admission: AD | Admit: 2015-06-20 | Discharge: 2015-06-20 | Disposition: A | Discharge status: Home / Self Care | Payer: Medicare Other | Source: Skilled Nursing Facility | Attending: Internal Medicine | Admitting: Internal Medicine

## 2015-06-20 DIAGNOSIS — I482 Chronic atrial fibrillation: Secondary | ICD-10-CM | POA: Diagnosis not present

## 2015-06-20 DIAGNOSIS — Z7901 Long term (current) use of anticoagulants: Secondary | ICD-10-CM | POA: Diagnosis not present

## 2015-06-20 LAB — PROTIME-INR
INR: 2.9 — ABNORMAL HIGH (ref 0.00–1.49)
PROTHROMBIN TIME: 29.9 s — AB (ref 11.6–15.2)

## 2015-06-24 ENCOUNTER — Encounter (HOSPITAL_COMMUNITY)
Admission: RE | Admit: 2015-06-24 | Discharge: 2015-06-24 | Disposition: A | Discharge status: Home / Self Care | Payer: Medicare Other | Source: Skilled Nursing Facility | Attending: Internal Medicine | Admitting: Internal Medicine

## 2015-06-24 DIAGNOSIS — I482 Chronic atrial fibrillation: Secondary | ICD-10-CM | POA: Diagnosis not present

## 2015-06-24 DIAGNOSIS — Z7901 Long term (current) use of anticoagulants: Secondary | ICD-10-CM | POA: Diagnosis not present

## 2015-06-24 LAB — PROTIME-INR
INR: 2.92 — ABNORMAL HIGH (ref 0.00–1.49)
PROTHROMBIN TIME: 30 s — AB (ref 11.6–15.2)

## 2015-06-27 ENCOUNTER — Encounter (HOSPITAL_COMMUNITY)
Admission: RE | Admit: 2015-06-27 | Discharge: 2015-06-27 | Disposition: A | Discharge status: Home / Self Care | Payer: Medicare Other | Source: Skilled Nursing Facility | Attending: Internal Medicine | Admitting: Internal Medicine

## 2015-06-27 DIAGNOSIS — Z7901 Long term (current) use of anticoagulants: Secondary | ICD-10-CM | POA: Diagnosis not present

## 2015-06-27 DIAGNOSIS — I482 Chronic atrial fibrillation: Secondary | ICD-10-CM | POA: Diagnosis not present

## 2015-06-27 LAB — PROTIME-INR
INR: 2.96 — ABNORMAL HIGH (ref 0.00–1.49)
PROTHROMBIN TIME: 30.3 s — AB (ref 11.6–15.2)

## 2015-06-29 ENCOUNTER — Non-Acute Institutional Stay (SKILLED_NURSING_FACILITY): Payer: Medicare Other | Admitting: Internal Medicine

## 2015-06-29 DIAGNOSIS — J42 Unspecified chronic bronchitis: Secondary | ICD-10-CM | POA: Diagnosis not present

## 2015-06-29 DIAGNOSIS — I83223 Varicose veins of left lower extremity with both ulcer of ankle and inflammation: Secondary | ICD-10-CM | POA: Diagnosis not present

## 2015-06-29 DIAGNOSIS — L97329 Non-pressure chronic ulcer of left ankle with unspecified severity: Principal | ICD-10-CM

## 2015-06-29 DIAGNOSIS — I5032 Chronic diastolic (congestive) heart failure: Secondary | ICD-10-CM

## 2015-07-01 ENCOUNTER — Encounter (HOSPITAL_COMMUNITY)
Admission: RE | Admit: 2015-07-01 | Discharge: 2015-07-01 | Disposition: A | Discharge status: Home / Self Care | Payer: Medicare Other | Source: Skilled Nursing Facility | Attending: Internal Medicine | Admitting: Internal Medicine

## 2015-07-01 DIAGNOSIS — Z7901 Long term (current) use of anticoagulants: Secondary | ICD-10-CM | POA: Diagnosis not present

## 2015-07-01 DIAGNOSIS — I482 Chronic atrial fibrillation: Secondary | ICD-10-CM | POA: Diagnosis not present

## 2015-07-01 LAB — PROTIME-INR
INR: 2.59 — AB (ref 0.00–1.49)
PROTHROMBIN TIME: 27.4 s — AB (ref 11.6–15.2)

## 2015-07-02 NOTE — Progress Notes (Addendum)
Patient ID: [name], male   DOB: 1931/11/09, 82 y.o.   MRN: [id number]                PROGRESS NOTE  DATE:  06/29/2015        FACILITY: [facility]                    LEVEL OF CARE:   SNF   Acute Visit                 CHIEF COMPLAINT:  Wound on the left lateral malleolus.    HISTORY OF PRESENT ILLNESS:  [name] is a man whom I have followed long-term both in this facility and the [facility].  He has a history of severe bilateral stasis dermatitis and inflammation.  He has had recurrent wounds that, fortunately, have not been too bad since.    I have been asked to see him today with regards to a wound on the left lateral malleolus.  The facility prescribed Santyl.  He also has silver alginate on the right leg.  His skin is really dry and flaky.  We might have to try and do something about this or he is going to have a series of issues.    The patient's other medical issues appear to be reasonably stable, at least by discussion.    He has COPD.  He is not on oxygen.    He has chronic atrial fibrillation, on Coumadin, as well as a history of DVT.    He has severe osteoarthritis of his hips and knees and he is essentially nonambulatory with flexion contractures.  He is, however, able to move himself around in a wheelchair.    PAST MEDICAL HISTORY/PROBLEM LIST:   Past Medical History  Diagnosis Date  . Venous insufficiency   . AAA (abdominal aortic aneurysm) (HCC)   . Ulcer   . DVT (deep venous thrombosis) (HCC) 05/04/2013    "LLE"  . Cellulitis   . Nicotine dependence   . Bronchospasm   . Elevated PSA   . BPH (benign prostatic hyperplasia)   . COPD (chronic obstructive pulmonary disease) (HCC)   . Cognitive impairment   . Daily headache   . Arthritis     "probably on the right leg/hip" (09/20/2013)  . Dementia     "don't know exactly what kind" (09/20/2013)  . Kidney stones   . Glaucoma, both eyes   . Atrial fibrillation (HCC)   . Fall at  home September 18, 2013  . Benign localized hyperplasia of prostate with urinary retention 12/23/2014  . AAA (abdominal aortic aneurysm) without rupture (HCC) 10/06/2012  . Anticoagulation goal of INR 2 to 3 06/01/2013  . B12 deficiency 09/20/2013  . Bladder calculus 12/23/2014    2.6cm and present since at least 2011 when it was about 1.6cm.   . Cardiomyopathy (HCC) 10/15/2014  . CHF (congestive heart failure) (HCC) 09/26/2013  . Chronic venous insufficiency 10/06/2012  . Edema-Bilat Leg and foot 03/07/2013  . Multifactorial dementia 10/06/2012  . Obesity, unspecified 10/06/2012  . Peripheral vascular disease (HCC) 09/20/2012  . Pyelonephritis, acute 12/22/2014  . Weakness 09/18/2013  . Wound cellulitis     let ankle wound size of half dollar per nurse at [facility] on 03/25/2015     PAST SURGICAL HISTOry Past Surgical History  Procedure Laterality Date  . Cataract extraction w/ intraocular lens  implant, bilateral Bilateral   .  Insitu percutaneous pinning femur Right 1986  . Hip fracture surgery Right 2009  . Back surgery    . Posterior laminectomy / decompression lumbar spine  2004    Decompressive laminectomy unilaterally on the right at L4-5 with  . Femur fracture surgery Right 04/1985  . Cystoscopy w/ stone manipulation  1970's    "once"  . Spine surgery    . Eye surgery    . Cholecystectomy  2011    Gall Bladder  . Cystoscopy with litholapaxy N/A 03/28/2015    Procedure: CYSTOSCOPY WITH LITHOLAPAXY;  Surgeon: Malen Gauze, MD;  Location: [facility];  Service: Urology;  Laterality: N/A;  . Transurethral resection of prostate N/A 03/28/2015    Procedure: TRANSURETHRAL RESECTION OF THE PROSTATE ;  Surgeon: Malen Gauze, MD;  Location: [facility];  Service: Urology;  Laterality: N/A;        CURRENT MEDICATIONS:  Medication list is reviewed.            Cardizem 120 q.d.       Cardura 8 q.d.      Finasteride 5 q.d.      Metoprolol 25 mg,  tablet/12.5 mg b.i.d.        Xalatan ophthalmic 0.005%, 1 drop into both eyes at bedtime.    DuoNebs q.i.d. four times a day p.r.n.       Hydrocodone/APAP 5/325, 1 tablet every 4 hours p.r.n.      Promethazine 12.5 rectal suppository p.r.n. nausea and vomiting.    Zoloft 75 q.d.      Coumadin 4.5 on Monday, Wednesday, and Friday, and 5 mg other days.    REVIEW OF SYSTEMS:    GENERAL:  The patient states he feels well.   CHEST/RESPIRATORY:  No shortness of breath.  No cough.   CARDIAC:  No chest pain.   GI:  No abdominal pain.   No diarrhea.   GU:  Foley catheter in place.      CIRCULATION:  Extremities:  As noted, history of severe bilateral venous insufficiency with inflammation.      PHYSICAL EXAMINATION:   GENERAL APPEARANCE:  The patient is in absolutely no distress.    CHEST/RESPIRATORY:  Shallow air entry, but no crackles or wheezes.      CARDIOVASCULAR:   CARDIAC:  Heart sounds are regular.  2/6 systolic ejection murmur that does not radiate.  His JVP is not elevated.      GASTROINTESTINAL:   LIVER/SPLEEN/KIDNEYS:  No liver, no spleen.  No tenderness.     GENITOURINARY:   BLADDER:  No suprapubic or costovertebral angle tenderness.   CIRCULATION:   EDEMA/VARICOSITIES:  Extremities:  He has severe bilateral venous stasis.   SKIN:   INSPECTION:  He has silver alginate on the right leg for some superficial eschar.  He may be pulling skin off.   The area in question is actually on the left lateral malleolus.  This is a clean wound.  The facility is using Santyl and foam.  This is appropriate.  At some point, he might benefit from switching to silver collagen.    ASSESSMENT/PLAN:                    Venous insufficiency with inflammation and chronic ulcerations.  Now with a wound on the left lateral malleolus.  I am going to prescribe triamcinolone and Cetaphil to both legs.  The skin is flaking here and could predispose to wounds.  I agree with the [facility] for  now to the left lateral malleolus.      COPD.  This does not seem unstable.    CHF.  There is a history here, which is diastolic.  He does not have a history of coronary  artery disease.  Last echocardiogram showed normal EF, grade I diastolic dysfunction.  I do not see any problem here.

## 2015-07-04 ENCOUNTER — Encounter (HOSPITAL_COMMUNITY)
Admission: AD | Admit: 2015-07-04 | Discharge: 2015-07-04 | Disposition: A | Discharge status: Home / Self Care | Payer: Medicare Other | Source: Skilled Nursing Facility | Attending: Internal Medicine | Admitting: Internal Medicine

## 2015-07-04 DIAGNOSIS — B9562 Methicillin resistant Staphylococcus aureus infection as the cause of diseases classified elsewhere: Secondary | ICD-10-CM | POA: Diagnosis not present

## 2015-07-04 DIAGNOSIS — Z7901 Long term (current) use of anticoagulants: Secondary | ICD-10-CM | POA: Diagnosis not present

## 2015-07-04 DIAGNOSIS — L03115 Cellulitis of right lower limb: Secondary | ICD-10-CM | POA: Diagnosis not present

## 2015-07-04 DIAGNOSIS — F0390 Unspecified dementia without behavioral disturbance: Secondary | ICD-10-CM | POA: Insufficient documentation

## 2015-07-04 DIAGNOSIS — B349 Viral infection, unspecified: Secondary | ICD-10-CM | POA: Insufficient documentation

## 2015-07-04 DIAGNOSIS — I5032 Chronic diastolic (congestive) heart failure: Secondary | ICD-10-CM | POA: Diagnosis not present

## 2015-07-04 LAB — PROTIME-INR
INR: 2.34 — AB (ref 0.00–1.49)
PROTHROMBIN TIME: 25.4 s — AB (ref 11.6–15.2)

## 2015-07-08 ENCOUNTER — Encounter (HOSPITAL_COMMUNITY)
Admission: RE | Admit: 2015-07-08 | Discharge: 2015-07-08 | Disposition: A | Discharge status: Home / Self Care | Payer: Medicare Other | Source: Skilled Nursing Facility | Attending: Internal Medicine | Admitting: Internal Medicine

## 2015-07-08 DIAGNOSIS — Z7901 Long term (current) use of anticoagulants: Secondary | ICD-10-CM | POA: Diagnosis not present

## 2015-07-08 DIAGNOSIS — L03115 Cellulitis of right lower limb: Secondary | ICD-10-CM | POA: Diagnosis not present

## 2015-07-08 DIAGNOSIS — B349 Viral infection, unspecified: Secondary | ICD-10-CM | POA: Diagnosis not present

## 2015-07-08 DIAGNOSIS — B9562 Methicillin resistant Staphylococcus aureus infection as the cause of diseases classified elsewhere: Secondary | ICD-10-CM | POA: Diagnosis not present

## 2015-07-08 DIAGNOSIS — I5032 Chronic diastolic (congestive) heart failure: Secondary | ICD-10-CM | POA: Diagnosis not present

## 2015-07-08 LAB — PROTIME-INR
INR: 2.48 — AB (ref 0.00–1.49)
PROTHROMBIN TIME: 26.6 s — AB (ref 11.6–15.2)

## 2015-07-11 ENCOUNTER — Encounter (HOSPITAL_COMMUNITY)
Admission: AD | Admit: 2015-07-11 | Discharge: 2015-07-11 | Disposition: A | Discharge status: Home / Self Care | Payer: Medicare Other | Source: Skilled Nursing Facility | Attending: Internal Medicine | Admitting: Internal Medicine

## 2015-07-11 DIAGNOSIS — B9562 Methicillin resistant Staphylococcus aureus infection as the cause of diseases classified elsewhere: Secondary | ICD-10-CM | POA: Diagnosis not present

## 2015-07-11 DIAGNOSIS — L03115 Cellulitis of right lower limb: Secondary | ICD-10-CM | POA: Diagnosis not present

## 2015-07-11 DIAGNOSIS — I5032 Chronic diastolic (congestive) heart failure: Secondary | ICD-10-CM | POA: Diagnosis not present

## 2015-07-11 DIAGNOSIS — Z7901 Long term (current) use of anticoagulants: Secondary | ICD-10-CM | POA: Diagnosis not present

## 2015-07-11 DIAGNOSIS — B349 Viral infection, unspecified: Secondary | ICD-10-CM | POA: Diagnosis not present

## 2015-07-11 LAB — PROTIME-INR
INR: 2.2 — ABNORMAL HIGH (ref 0.00–1.49)
Prothrombin Time: 24.2 seconds — ABNORMAL HIGH (ref 11.6–15.2)

## 2015-07-15 ENCOUNTER — Encounter (HOSPITAL_COMMUNITY)
Admission: RE | Admit: 2015-07-15 | Discharge: 2015-07-15 | Disposition: A | Discharge status: Home / Self Care | Payer: Medicare Other | Source: Skilled Nursing Facility | Attending: Internal Medicine | Admitting: Internal Medicine

## 2015-07-15 DIAGNOSIS — I5032 Chronic diastolic (congestive) heart failure: Secondary | ICD-10-CM | POA: Diagnosis not present

## 2015-07-15 DIAGNOSIS — B9562 Methicillin resistant Staphylococcus aureus infection as the cause of diseases classified elsewhere: Secondary | ICD-10-CM | POA: Diagnosis not present

## 2015-07-15 DIAGNOSIS — L03115 Cellulitis of right lower limb: Secondary | ICD-10-CM | POA: Diagnosis not present

## 2015-07-15 DIAGNOSIS — Z7901 Long term (current) use of anticoagulants: Secondary | ICD-10-CM | POA: Diagnosis not present

## 2015-07-15 DIAGNOSIS — B349 Viral infection, unspecified: Secondary | ICD-10-CM | POA: Diagnosis not present

## 2015-07-15 LAB — PROTIME-INR
INR: 2.21 — AB (ref 0.00–1.49)
Prothrombin Time: 24.3 seconds — ABNORMAL HIGH (ref 11.6–15.2)

## 2015-07-18 ENCOUNTER — Encounter (HOSPITAL_COMMUNITY)
Admission: AD | Admit: 2015-07-18 | Discharge: 2015-07-18 | Disposition: A | Discharge status: Home / Self Care | Payer: Medicare Other | Source: Skilled Nursing Facility | Attending: Internal Medicine | Admitting: Internal Medicine

## 2015-07-18 DIAGNOSIS — L03115 Cellulitis of right lower limb: Secondary | ICD-10-CM | POA: Diagnosis not present

## 2015-07-18 DIAGNOSIS — I5032 Chronic diastolic (congestive) heart failure: Secondary | ICD-10-CM | POA: Diagnosis not present

## 2015-07-18 DIAGNOSIS — B9562 Methicillin resistant Staphylococcus aureus infection as the cause of diseases classified elsewhere: Secondary | ICD-10-CM | POA: Diagnosis not present

## 2015-07-18 DIAGNOSIS — B349 Viral infection, unspecified: Secondary | ICD-10-CM | POA: Diagnosis not present

## 2015-07-18 DIAGNOSIS — Z7901 Long term (current) use of anticoagulants: Secondary | ICD-10-CM | POA: Diagnosis not present

## 2015-07-18 LAB — PROTIME-INR
INR: 2.23 — AB (ref 0.00–1.49)
PROTHROMBIN TIME: 24.5 s — AB (ref 11.6–15.2)

## 2015-07-18 LAB — URINE MICROSCOPIC-ADD ON

## 2015-07-18 LAB — URINALYSIS, ROUTINE W REFLEX MICROSCOPIC
BILIRUBIN URINE: NEGATIVE
GLUCOSE, UA: NEGATIVE mg/dL
KETONES UR: NEGATIVE mg/dL
Nitrite: NEGATIVE
PH: 5.5 (ref 5.0–8.0)
PROTEIN: 30 mg/dL — AB
Specific Gravity, Urine: 1.025 (ref 1.005–1.030)

## 2015-07-20 ENCOUNTER — Encounter (HOSPITAL_COMMUNITY)
Admission: RE | Admit: 2015-07-20 | Discharge: 2015-07-20 | Disposition: A | Discharge status: Home / Self Care | Payer: Medicare Other | Source: Skilled Nursing Facility | Attending: Internal Medicine | Admitting: Internal Medicine

## 2015-07-20 DIAGNOSIS — Z7901 Long term (current) use of anticoagulants: Secondary | ICD-10-CM | POA: Diagnosis not present

## 2015-07-20 LAB — PROTIME-INR
INR: 2.32 — ABNORMAL HIGH (ref 0.00–1.49)
PROTHROMBIN TIME: 25.2 s — AB (ref 11.6–15.2)

## 2015-07-21 LAB — URINE CULTURE: Culture: >=100000

## 2015-07-22 ENCOUNTER — Encounter (HOSPITAL_COMMUNITY)
Admission: RE | Admit: 2015-07-22 | Discharge: 2015-07-22 | Disposition: A | Discharge status: Home / Self Care | Payer: Medicare Other | Source: Skilled Nursing Facility | Attending: Internal Medicine | Admitting: Internal Medicine

## 2015-07-22 DIAGNOSIS — Z7901 Long term (current) use of anticoagulants: Secondary | ICD-10-CM | POA: Diagnosis not present

## 2015-07-22 DIAGNOSIS — I5032 Chronic diastolic (congestive) heart failure: Secondary | ICD-10-CM | POA: Diagnosis not present

## 2015-07-22 DIAGNOSIS — B349 Viral infection, unspecified: Secondary | ICD-10-CM | POA: Diagnosis not present

## 2015-07-22 DIAGNOSIS — B9562 Methicillin resistant Staphylococcus aureus infection as the cause of diseases classified elsewhere: Secondary | ICD-10-CM | POA: Diagnosis not present

## 2015-07-22 DIAGNOSIS — L03115 Cellulitis of right lower limb: Secondary | ICD-10-CM | POA: Diagnosis not present

## 2015-07-22 LAB — PROTIME-INR
INR: 2.59 — AB (ref 0.00–1.49)
Prothrombin Time: 27.4 seconds — ABNORMAL HIGH (ref 11.6–15.2)

## 2015-07-24 ENCOUNTER — Encounter (HOSPITAL_COMMUNITY)
Admission: RE | Admit: 2015-07-24 | Discharge: 2015-07-24 | Disposition: A | Discharge status: Home / Self Care | Payer: Medicare Other | Source: Skilled Nursing Facility | Attending: Internal Medicine | Admitting: Internal Medicine

## 2015-07-24 DIAGNOSIS — B9562 Methicillin resistant Staphylococcus aureus infection as the cause of diseases classified elsewhere: Secondary | ICD-10-CM | POA: Diagnosis not present

## 2015-07-24 DIAGNOSIS — Z7901 Long term (current) use of anticoagulants: Secondary | ICD-10-CM | POA: Diagnosis not present

## 2015-07-24 DIAGNOSIS — I5032 Chronic diastolic (congestive) heart failure: Secondary | ICD-10-CM | POA: Diagnosis not present

## 2015-07-24 DIAGNOSIS — L03115 Cellulitis of right lower limb: Secondary | ICD-10-CM | POA: Diagnosis not present

## 2015-07-24 DIAGNOSIS — B349 Viral infection, unspecified: Secondary | ICD-10-CM | POA: Diagnosis not present

## 2015-07-24 LAB — PROTIME-INR
INR: 2.64 — ABNORMAL HIGH (ref 0.00–1.49)
Prothrombin Time: 27.8 seconds — ABNORMAL HIGH (ref 11.6–15.2)

## 2015-07-25 ENCOUNTER — Encounter (HOSPITAL_COMMUNITY)
Admission: RE | Admit: 2015-07-25 | Discharge: 2015-07-25 | Disposition: A | Discharge status: Home / Self Care | Payer: Medicare Other | Source: Ambulatory Visit | Attending: Internal Medicine | Admitting: Internal Medicine

## 2015-07-26 ENCOUNTER — Non-Acute Institutional Stay (SKILLED_NURSING_FACILITY): Payer: Medicare Other | Admitting: Internal Medicine

## 2015-07-26 ENCOUNTER — Encounter: Payer: Self-pay | Admitting: Internal Medicine

## 2015-07-26 ENCOUNTER — Encounter (HOSPITAL_COMMUNITY)
Admission: AD | Admit: 2015-07-26 | Discharge: 2015-07-26 | Disposition: A | Discharge status: Home / Self Care | Payer: Medicare Other | Source: Skilled Nursing Facility | Attending: Internal Medicine | Admitting: Internal Medicine

## 2015-07-26 ENCOUNTER — Encounter (HOSPITAL_COMMUNITY)
Admission: RE | Admit: 2015-07-26 | Discharge: 2015-07-26 | Disposition: A | Discharge status: Home / Self Care | Payer: Medicare Other | Source: Skilled Nursing Facility | Attending: Internal Medicine | Admitting: Internal Medicine

## 2015-07-26 DIAGNOSIS — I420 Dilated cardiomyopathy: Secondary | ICD-10-CM

## 2015-07-26 DIAGNOSIS — Z5181 Encounter for therapeutic drug level monitoring: Secondary | ICD-10-CM | POA: Diagnosis not present

## 2015-07-26 DIAGNOSIS — B9562 Methicillin resistant Staphylococcus aureus infection as the cause of diseases classified elsewhere: Secondary | ICD-10-CM | POA: Diagnosis not present

## 2015-07-26 DIAGNOSIS — Z7901 Long term (current) use of anticoagulants: Secondary | ICD-10-CM

## 2015-07-26 DIAGNOSIS — N139 Obstructive and reflux uropathy, unspecified: Secondary | ICD-10-CM | POA: Diagnosis not present

## 2015-07-26 DIAGNOSIS — N400 Enlarged prostate without lower urinary tract symptoms: Secondary | ICD-10-CM | POA: Diagnosis not present

## 2015-07-26 DIAGNOSIS — R4189 Other symptoms and signs involving cognitive functions and awareness: Secondary | ICD-10-CM

## 2015-07-26 DIAGNOSIS — I4891 Unspecified atrial fibrillation: Secondary | ICD-10-CM

## 2015-07-26 DIAGNOSIS — L03115 Cellulitis of right lower limb: Secondary | ICD-10-CM | POA: Diagnosis not present

## 2015-07-26 DIAGNOSIS — B349 Viral infection, unspecified: Secondary | ICD-10-CM | POA: Diagnosis not present

## 2015-07-26 DIAGNOSIS — I5032 Chronic diastolic (congestive) heart failure: Secondary | ICD-10-CM | POA: Diagnosis not present

## 2015-07-26 LAB — INFLUENZA PANEL BY PCR (TYPE A & B)
H1N1 flu by pcr: NOT DETECTED
INFLAPCR: NEGATIVE
Influenza B By PCR: NEGATIVE

## 2015-07-26 LAB — PROTIME-INR
INR: 2.28 — ABNORMAL HIGH (ref 0.00–1.49)
Prothrombin Time: 24.9 seconds — ABNORMAL HIGH (ref 11.6–15.2)

## 2015-07-26 NOTE — Progress Notes (Signed)
Patient ID: [name], male   DOB: 1931/12/15, 82 y.o.   MRN: [id number]    Facility [facility] This is a routine visit.  Level care skilled.  Chief complaint --medical management of chronic medical conditions including obstructive uropathy-atrial fibrillation-diastolic CHF-COPD .  History of present illness.  Patient is a pleasant 82 year old with a complex medical history--including COPD atrial fibrillation with history of DVT  afib on chronic anticoagulation as well as obstructive uropathy.  He rhad a TURP procedure done on October 27 by urology apparently tolerated this well.   He continues a chronic indwelling Foley catheter.  At times he will get a urine culture done he did have a couple recently that grew out MRSA initially this was treated with Zyvox.  He was relatively asymptomatic during this.  At some point another urine culture was recently obtained which came back on December 8 positive again for MRSA-he was empirically put on Macrobid-Dr. Leanord Hawking did address this later however and since patient was essentially asymptomatic discontinued this.--He recently had another culture that grew out MRSA again he was relatively asymptomatic he had been*Macrobid but this was discontinued.  Apparently did have some increased agitation or use of bad language which he  usually does not use but apparently this was transitory and was not really an acute change in mental status  In this regards patient appears to be stable he is followed by urology.     Patient appears to have a history of diastolic CHF this appears stable he is not on a diuretic currently nonetheless his weights and edema appears to be relatively stable he does have significant venous stasis issues bilaterally.  Regards to atrial fibrillation this appears rate controlled he is on the metoprolol on Coumadin for anticoagulation his INR today is therapeutic at 2.28   In regards see other issues is depression appears to  be stable on Zoloft.  Hypertension appears stable recent blood pressures 118/73-124/84-.  He does have a history of anemia most likely chronic disease hemoglobin of 9.8 back in December is relatively baseline we will need to update this  He also has periods renal insufficiency this is thought probably secondary to his obstructive uropathy when he has issues- Creatinine 1.02 BUN of 22 back on December 15 is relatively baseline  . Patient does have again soon of a history of venous stasis with at times cellulitis of lower extremities this is followed closely by wound care and Dr. Leanord Hawking currently his left leg is wrapped he is not on any antibiotic when there are signs of increased erythema an antibiotic usually started    Labs  07/26/2015.  INR-2.28.  05/20/2015.  WBC 7.8 hemoglobin 9.8 platelets 308.  05/16/2015.  Sodium 142 potassium 4.1 BUN 22 creatinine 1.02.  04/17/2015.  WBC 8.1 hemoglobin 9.9 platelets 299.  Sodium 141 potassium 4.3 BUN 21 creatinine 0.3.  Albumin 3.0 ALT 10 otherwise liver function tests within normal limits  03/30/2015.  Sodium 143 potassium 4.2 BUN 21 creatinine 1.15.  WBC 8.7 hemoglobin 10.1 platelets 268.    BMP Latest Ref Rng 03/18/2015 02/16/2015 02/14/2015  Glucose 65 - 99 mg/dL 409(W) 96 119(J)  BUN 6 - 20 mg/dL 47(W) 29(F) 62(Z)  Creatinine 0.61 - 1.24 mg/dL 3.08(M) 5.78(I) 6.96(E)  BUN/Creat Ratio 10 - 22 - - -  Sodium 135 - 145 mmol/L 146(H) 145 146(H)  Potassium 3.5 - 5.1 mmol/L 4.3 3.5 3.8  Chloride 101 - 111 mmol/L 112(H) 114(H) 113(H)  CO2 22 - 32  mmol/L 29 27 26   Calcium 8.9 - 10.3 mg/dL 9.1 1.6(X) 0.9(U)   Family medical social history reviewed per admission note on 12/28/2014  Past Medical History  Diagnosis Date  . Venous insufficiency   . AAA (abdominal aortic aneurysm)   . Ulcer   . DVT (deep venous thrombosis) 05/04/2013    "LLE"  . Cellulitis   . Nicotine dependence   . Bronchospasm   . Elevated PSA   . BPH  (benign prostatic hyperplasia)   . COPD (chronic obstructive pulmonary disease)   . Cognitive impairment   . Daily headache   . Arthritis     "probably on the right leg/hip" (09/20/2013)  . Dementia     "don't know exactly what kind" (09/20/2013)  . Kidney stones   . Glaucoma, both eyes   . Atrial fibrillation   . Fall at home September 18, 2013  . Benign localized hyperplasia of prostate with urinary retention 12/23/2014  . AAA (abdominal aortic aneurysm) without rupture 10/06/2012  . Anticoagulation goal of INR 2 to 3 06/01/2013  . B12 deficiency 09/20/2013  . Bladder calculus 12/23/2014    2.6cm and present since at least 2011 when it was about 1.6cm.   . Cardiomyopathy 10/15/2014  . CHF (congestive heart failure) 09/26/2013  . Chronic venous insufficiency 10/06/2012  . Edema-Bilat Leg and foot 03/07/2013  . Multifactorial dementia 10/06/2012  . Obesity, unspecified 10/06/2012  . Peripheral vascular disease 09/20/2012  . Pyelonephritis, acute 12/22/2014  . Weakness 09/18/2013    Medications reviewed per [facility] and they include.  Cardizem 120 mg extended release daily.  Cardura 8 mg daily.  Proscar 5 mg daily.  Duo nebs 4 times a day when necessary.  Lopressor 12.5 mg twice a day.  Zoloft 75 mg daily.  Vicodin 5-3 25 mg every 4 hours when   Coumadin 5 mg by mouth daily  Proscar 5 mg daily.  .   Review of systems  Somewhat limited since patient is a poor historian secondary to dementia General nof fever chills.  Skin does have significant venous stasis changes   Head ears eyes nose mouth and throat does not complain of sore throat or nasal discharge.  Respiratory is not complaining of any shortness of breath or cough.  Cardiac no chest pain has significant venous stasis lower extremities but this appears to be baseline stable   GI does not complain of nausea vomiting diarrhea constipation or abdominal pain.  GU extensive history as noted above is not complaining of dysuria  however --or back pain.  Musculoskeletal has lower extremity weakness which is baseline does not complain of joint pain today.  Neurologic does not complain of dizziness or headaches.    Physical examination GEN 90.2 pulse 69 respirations 20 blood pressure 118/73 weight is 305 it was 301 back on January 26 appears baseline is around 300 305 range recently   In general- patient is not in any distress sitting comfortably in his chair Skin is warm and dry he does have scaling venous stasis changes lower legs bilaterally is left leg is wrapped-ri    Eyes pupils appear reactive to light visual acuity appears grossly intact.  Oropharynx clear mucous membranes moist.    Respiratory--relatively clear to auscultation with shallow air entry no labored breathing cardiac Heart sounds  regular irregular---rate  controlled he appears to be euvolemic Abdomen obese no tenderness slightly hypoactive bowel sounds  GU Foley catheter in place--with dark amber colored urine-- no  Overt  CVA  tenderness Extremities severe bilateral venous insufficiency--left legcurrently wrapped-  Neurologic is grossly intact speech is clear.  Psych -- he has mild dementia he is pleasant conversant follow simple verbal commands with no difficulty  .   Impressions/plan  #1  History of obstructive uropathy he is followed by urology currently has catheter placedAt times he has grown out MRSA but this appears to be asymptomatic--n at this point will monitor for any symptoms back pain fever chills dysuria complaints he does not appear to have any of these today--I did discuss plan of care with his daughter via phone and she expresses understanding He does have a history of BPH continues on Proscar-- Patient actually has urology appointment earlier this week but his urologist was ill-this is currently being rescheduled  Atrial fibrillation;  This appears rate controlled he is on metoprolol as well as Cardizem he is on  Coumadin for anticoagulation INR is therapeutic at 2.28 this will need updating next week.   .     #3 COPD this is significant but stable. Continue nebulizers--ap   #4 history of CHF I don't see any of this currently. Weights have been  Fairly  stable and not on a diuretic-this will have to be watched  #5 chronic renal failure.  Recent creatinine of 1.02 appears to be stable for him will update this.  #6 history DVT-continues on chronic anticoagulation with Coumadin as noted above -.  #7 depression this appears stable on Zoloft.  # #8 hypertension this appears stable as noted above he is on Lopressor Cardizem as well as Cardura--blood pressures 118/73-124/84  #9 anemia-this appears relatively baseline per chart review it 9.8 suspect there is element of chronic disease here we will update this for follow-up.   .  #10 history of abdominal aortic aneurysm-this has been followed by vascular-I do note he had a CT scan done in July 2016 which showed an unchanged 4.4 cm in diameter AAA-recommendation to repeat an ultrasound in 1 year     [id number]-of note greater than 35 minutes spent assessing patient-reviewing his chart-and coordinating and formulating a plan of care for numerous diagnoses-note greater than 50% of time spent coordinating plan of care and discussing patient's status with daughter over phone

## 2015-07-27 ENCOUNTER — Other Ambulatory Visit (HOSPITAL_COMMUNITY)
Admission: AD | Admit: 2015-07-27 | Discharge: 2015-07-27 | Disposition: A | Discharge status: Home / Self Care | Payer: Medicare Other | Source: Skilled Nursing Facility | Attending: Internal Medicine | Admitting: Internal Medicine

## 2015-07-27 DIAGNOSIS — B349 Viral infection, unspecified: Secondary | ICD-10-CM | POA: Diagnosis not present

## 2015-07-27 LAB — INFLUENZA PANEL BY PCR (TYPE A & B)
H1N1FLUPCR: NOT DETECTED
INFLAPCR: NEGATIVE
INFLBPCR: NEGATIVE

## 2015-07-29 ENCOUNTER — Encounter (HOSPITAL_COMMUNITY)
Admission: RE | Admit: 2015-07-29 | Discharge: 2015-07-29 | Disposition: A | Discharge status: Home / Self Care | Payer: Medicare Other | Source: Skilled Nursing Facility | Attending: Internal Medicine | Admitting: Internal Medicine

## 2015-07-29 DIAGNOSIS — B349 Viral infection, unspecified: Secondary | ICD-10-CM | POA: Diagnosis not present

## 2015-07-29 DIAGNOSIS — Z7901 Long term (current) use of anticoagulants: Secondary | ICD-10-CM | POA: Diagnosis not present

## 2015-07-29 DIAGNOSIS — L03115 Cellulitis of right lower limb: Secondary | ICD-10-CM | POA: Diagnosis not present

## 2015-07-29 DIAGNOSIS — B9562 Methicillin resistant Staphylococcus aureus infection as the cause of diseases classified elsewhere: Secondary | ICD-10-CM | POA: Diagnosis not present

## 2015-07-29 DIAGNOSIS — I5032 Chronic diastolic (congestive) heart failure: Secondary | ICD-10-CM | POA: Diagnosis not present

## 2015-07-29 LAB — BASIC METABOLIC PANEL
Anion gap: 7 (ref 5–15)
BUN: 24 mg/dL — AB (ref 6–20)
CALCIUM: 8.6 mg/dL — AB (ref 8.9–10.3)
CHLORIDE: 109 mmol/L (ref 101–111)
CO2: 28 mmol/L (ref 22–32)
CREATININE: 1.04 mg/dL (ref 0.61–1.24)
GFR calc non Af Amer: >60 mL/min (ref >60)
Glucose, Bld: 107 mg/dL — ABNORMAL HIGH (ref 65–99)
Potassium: 4.1 mmol/L (ref 3.5–5.1)
SODIUM: 144 mmol/L (ref 135–145)

## 2015-07-29 LAB — CBC WITH DIFFERENTIAL/PLATELET
BASOS PCT: 1 %
Basophils Absolute: 0.1 10*3/uL (ref 0.0–0.1)
EOS ABS: 1.3 10*3/uL — AB (ref 0.0–0.7)
EOS PCT: 13 %
HCT: 33.3 % — ABNORMAL LOW (ref 39.0–52.0)
Hemoglobin: 10.2 g/dL — ABNORMAL LOW (ref 13.0–17.0)
LYMPHS ABS: 1.8 10*3/uL (ref 0.7–4.0)
Lymphocytes Relative: 18 %
MCH: 26 pg (ref 26.0–34.0)
MCHC: 30.6 g/dL (ref 30.0–36.0)
MCV: 84.7 fL (ref 78.0–100.0)
MONO ABS: 1.1 10*3/uL — AB (ref 0.1–1.0)
MONOS PCT: 11 %
NEUTROS PCT: 57 %
Neutro Abs: 5.7 10*3/uL (ref 1.7–7.7)
PLATELETS: 223 10*3/uL (ref 150–400)
RBC: 3.93 MIL/uL — ABNORMAL LOW (ref 4.22–5.81)
RDW: 17.2 % — AB (ref 11.5–15.5)
WBC: 9.9 10*3/uL (ref 4.0–10.5)

## 2015-07-29 LAB — PROTIME-INR
INR: 2.65 — AB (ref 0.00–1.49)
PROTHROMBIN TIME: 27.8 s — AB (ref 11.6–15.2)

## 2015-08-01 ENCOUNTER — Encounter (HOSPITAL_COMMUNITY)
Admission: RE | Admit: 2015-08-01 | Discharge: 2015-08-01 | Disposition: A | Discharge status: Home / Self Care | Payer: Medicare Other | Source: Skilled Nursing Facility | Attending: Internal Medicine | Admitting: Internal Medicine

## 2015-08-01 DIAGNOSIS — D649 Anemia, unspecified: Secondary | ICD-10-CM | POA: Insufficient documentation

## 2015-08-01 DIAGNOSIS — Z7901 Long term (current) use of anticoagulants: Secondary | ICD-10-CM | POA: Diagnosis not present

## 2015-08-01 DIAGNOSIS — I739 Peripheral vascular disease, unspecified: Secondary | ICD-10-CM | POA: Insufficient documentation

## 2015-08-01 LAB — PROTIME-INR
INR: 2.49 — AB (ref 0.00–1.49)
PROTHROMBIN TIME: 26.6 s — AB (ref 11.6–15.2)

## 2015-08-02 ENCOUNTER — Non-Acute Institutional Stay (SKILLED_NURSING_FACILITY): Payer: Medicare Other | Admitting: Internal Medicine

## 2015-08-02 DIAGNOSIS — Z5181 Encounter for therapeutic drug level monitoring: Secondary | ICD-10-CM | POA: Diagnosis not present

## 2015-08-02 DIAGNOSIS — F068 Other specified mental disorders due to known physiological condition: Secondary | ICD-10-CM

## 2015-08-02 DIAGNOSIS — Z7901 Long term (current) use of anticoagulants: Secondary | ICD-10-CM

## 2015-08-02 DIAGNOSIS — I482 Chronic atrial fibrillation, unspecified: Secondary | ICD-10-CM

## 2015-08-02 DIAGNOSIS — R4182 Altered mental status, unspecified: Secondary | ICD-10-CM

## 2015-08-02 DIAGNOSIS — F0390 Unspecified dementia without behavioral disturbance: Secondary | ICD-10-CM

## 2015-08-02 NOTE — Progress Notes (Signed)
Patient ID: [name], male   DOB: Jul 30, 1931, 82 y.o.   MRN: [id number]     Facility [facility] This is an acute visit.  Level care skilled.  Chief complaint -- Acute visit secondary to anticoagulation management with history of atrial fibrillation-also daughter's concerns that patient may be slowly declining .  History of present illness.  Patient is a pleasant 82 year old with a complex medical history--including COPD atrial fibrillation with history of DVT  afib on chronic anticoagulation multi-factorial dementia as well as obstructive uropathy.  He rhad a TURP procedure done on October 27 by urology apparently tolerated this well.   He continues a chronic indwelling Foley catheter.  At times he will get a urine culture done he did have one recently that grew out MRSA initially this was treated with Zyvox.  He was relatively asymptomatic during this.  At some point another urine culture was recently obtained which came back on December 8 positive again for MRSA-he was empirically put on Macrobid-Dr. Leanord Hawking did address this later however and since patient was essentially asymptomatic discontinued this.--He recently had another culture that grew out MRSA again he was relatively asymptomatic he had been*Macrobid but this was discontinued.  Apparently did have some increased agitation or use of bad language which he  usually does not use but apparently this was transitory and was not really an acute change in mental status  In this regards patient appears to be stable he is followed by urology.   His daughter was concerned today thinking her father is having somewhat of a decline-however I did discuss this with nursing as well as with his daughter in the room-it appears at this point he has some better days and some not so good days but appear to be relatively at his baseline today-recent labs were done which were not concerning for any acute change hemoglobin is stable at 10.2 white  count is normal at 9.9-electrolytes are within normal range as well as renal function stable with a creatinine of 1.04 BUN of 24.  Vital signs have been stable he is been afebrile does not really complain of any dysuria burning with urination  He is completing a course of empiric Tamiflu-since there have been several flu cases in the facility-however he does not really have any increased cough from baseline-his duo nebs will have to be encouraged however  Patient appears to have a history of diastolic CHF this appears stable he is not on a diuretic currently nonetheless his weights and edema appears to be relatively stable he does have significant venous stasis issues bilaterally.  Regards to atrial fibrillation this appears rate controlled he is on the metoprolol on Coumadin for anticoagulation his INR today is therapeutic at 2.49 he is on 5 mg Coumadin this will need to be rechecked next week   In regards see other issues is depression appears to be stable on Zoloft.  He does have a history of multifactorial dementia but has done well with supportive care here-he is pleasant somewhat confused at times but this is not abnormal appears to function well in this setting     . Patient does have again soon of a history of venous stasis with at times cellulitis of lower extremities this is followed closely by wound care who actually visited his daughter with me he is not on any antibiotic when there are signs of increased erythema an antibiotic usually started    Labs  08/02/2015.  INR 2.49.  07/29/2015.  WBC 9.9 hemoglobin 10.2 platelets 223.  Sodium 144 potassium 4.1 CO2 level 28-BUN 24-creatinine 1.04  07/26/2015.  INR-2.28.  05/20/2015.  WBC 7.8 hemoglobin 9.8 platelets 308.  05/16/2015.  Sodium 142 potassium 4.1 BUN 22 creatinine 1.02.  04/17/2015.  WBC 8.1 hemoglobin 9.9 platelets 299.  Sodium 141 potassium 4.3 BUN 21 creatinine 0.3.  Albumin 3.0 ALT 10 otherwise  liver function tests within normal limits  03/30/2015.  Sodium 143 potassium 4.2 BUN 21 creatinine 1.15.  WBC 8.7 hemoglobin 10.1 platelets 268.    BMP Latest Ref Rng 03/18/2015 02/16/2015 02/14/2015  Glucose 65 - 99 mg/dL 960(A) 96 540(J)  BUN 6 - 20 mg/dL 81(X) 91(Y) 78(G)  Creatinine 0.61 - 1.24 mg/dL 9.56(O) 1.30(Q) 6.57(Q)  BUN/Creat Ratio 10 - 22 - - -  Sodium 135 - 145 mmol/L 146(H) 145 146(H)  Potassium 3.5 - 5.1 mmol/L 4.3 3.5 3.8  Chloride 101 - 111 mmol/L 112(H) 114(H) 113(H)  CO2 22 - 32 mmol/L Calcium 8.9 - 10.3 mg/dL 9.1 4.6(N) 6.2(X)   Family medical social history reviewed per admission note on 12/28/2014  Past Medical History  Diagnosis Date  . Venous insufficiency   . AAA (abdominal aortic aneurysm)   . Ulcer   . DVT (deep venous thrombosis) 05/04/2013    "LLE"  . Cellulitis   . Nicotine dependence   . Bronchospasm   . Elevated PSA   . BPH (benign prostatic hyperplasia)   . COPD (chronic obstructive pulmonary disease)   . Cognitive impairment   . Daily headache   . Arthritis     "probably on the right leg/hip" (09/20/2013)  . Dementia     "don't know exactly what kind" (09/20/2013)  . Kidney stones   . Glaucoma, both eyes   . Atrial fibrillation   . Fall at home September 18, 2013  . Benign localized hyperplasia of prostate with urinary retention 12/23/2014  . AAA (abdominal aortic aneurysm) without rupture 10/06/2012  . Anticoagulation goal of INR 2 to 3 06/01/2013  . B12 deficiency 09/20/2013  . Bladder calculus 12/23/2014    2.6cm and present since at least 2011 when it was about 1.6cm.   . Cardiomyopathy 10/15/2014  . CHF (congestive heart failure) 09/26/2013  . Chronic venous insufficiency 10/06/2012  . Edema-Bilat Leg and foot 03/07/2013  . Multifactorial dementia 10/06/2012  . Obesity, unspecified 10/06/2012  . Peripheral vascular disease 09/20/2012  . Pyelonephritis, acute 12/22/2014  . Weakness 09/18/2013    Medications reviewed per [facility] and they  include.  Cardizem 120 mg extended release daily.  Cardura 8 mg daily.  Proscar 5 mg daily.  Duo nebs 4 times a day when necessary.  Lopressor 12.5 mg twice a day.  Zoloft 75 mg daily.  Vicodin 5-3 25 mg every 4 hours when   Coumadin 5 mg by mouth daily  Proscar 5 mg daily.  .   Review of systems  Somewhat limited since patient is a poor historian secondary to dementia General no fever chills.  Skin does have significant venous stasis changes but no sign of cellulitis at this point this was seen by wound care as well today   Head ears eyes nose mouth and throat does not complain of sore throat or nasal discharge.  Respiratory is not complaining of any shortness of breath or cough.  Cardiac no chest pain has significant venous stasis lower extremities but this appears to be baseline stable   GI does not complain of nausea vomiting  diarrhea constipation or abdominal pain.  GU extensive history as noted above is not complaining of dysuria however --or back pain.  Musculoskeletal has lower extremity weakness which is baseline does not complain of joint pain   Neurologic does not complain of dizziness or headaches.    Physical examination  He is afebrile pulse 60 respirations 20 blood pressure 130/78-weight is stable at 298.8   In general- patient is not in any distress sitting comfortably in his chair Skin is warm and dry he does have stasis changes to his lower legs bilaterally this appears relatively baseline again this was seen with wound care nurse as well does not appear to have any acute cellulitic changes or increased warmth or erythema from baseline   Eyes pupils appear reactive to light visual acuity appears grossly intact.  Oropharynx clear mucous membranes moist.    Respiratory--relatively clear to auscultation with shallow air entry no labored breathing  cardiac Heart sounds  regular irregular---rate  controlled he appears to be euvolemic-if  anything his edema appears to be somewhat decreased from previous exams  Abdomen obese no tenderness slightly hypoactive bowel sounds   GU Foley catheter in place--with dark amber colored urine-- no  Overt  CVA tenderness  Extremities severe bilateral venous insufficiency--with continued lower extremity weakness which is not new upper extremity strength appears to be preserved  Neurologic is grossly intact speech is clear.--No lateralizing findings  Psych -- he has mild dementia he is pleasant conversant follow simple verbal commands with no difficulty  .   Impressions/plan  #1  History of obstructive uropathy he is followed by urology currently has catheter placedAt times he has grown out MRSA but this appears to be asymptomatic--n at this point will monitor for any symptoms back pain fever chills dysuria complaints--he continues to deny any of the symptoms currently-  He does have a history of BPH continues on Proscar--  His previous urology appointment was canceled secondary to urologist being ill-his daughter will be arranging a follow-up appointment  Atrial fibrillation;  This appears rate controlled he is on metoprolol as well as Cardizem he is on Coumadin for anticoagulation INR is therapeutic at 2.49 this will need updating next week.   .     #3 COPD this is significant but stable. Continue nebulizers--   #4 history of CHF I don't see any of this currently. Weights have been  Fairly  stable and not on a diuretic-this will have to be watched  #5 chronic renal failure.  Recent creatinine of 1.04appears to be stable   #6 history DVT-continues on chronic anticoagulation with Coumadin as noted above -.  #7 depression this appears stable on Zoloft. Appears he has some better days than others-  # #8 hypertension this appears stablee he is on Lopressor Cardizem as well as Cardura--blood pressures recently 130/78-122/62  #9 anemia-this appears relatively baseline  With  recent hemoglobin of 10.2.   .  #10 history of abdominal aortic aneurysm-this has been followed by vascular-I do note he had a CT scan done in July 2016 which showed an unchanged 4.4 cm in diameter AAA-recommendation to repeat an ultrasound in 1 year   Nursing and I did discuss patient's status with his daughter in the room-at this point he appears stable and at his baseline again labs and vital signs have been unremarkable-continue to monitor--and his daughter is in agreement with this  -if depression becomes more of a persistent issue certainly consider revisit by the psychiatric nurse practitioner but  today he appears to be pleasant alert interactive but this will have to be watched as well   [phone]

## 2015-08-03 ENCOUNTER — Non-Acute Institutional Stay (SKILLED_NURSING_FACILITY): Payer: Medicare Other | Admitting: Internal Medicine

## 2015-08-03 DIAGNOSIS — I83223 Varicose veins of left lower extremity with both ulcer of ankle and inflammation: Secondary | ICD-10-CM | POA: Diagnosis not present

## 2015-08-03 DIAGNOSIS — L03116 Cellulitis of left lower limb: Secondary | ICD-10-CM

## 2015-08-03 DIAGNOSIS — L97329 Non-pressure chronic ulcer of left ankle with unspecified severity: Secondary | ICD-10-CM

## 2015-08-03 NOTE — Progress Notes (Signed)
Patient ID: [name], male   DOB: 01-22-1932, 82 y.o.   MRN: [id number]    Facility; [facility] Chief complaint left ankle wound is draining and painful History; this is a patient with severe bilateral venous hypertension and inflammation. He has had recurrent ulcerations for as long as I have known him and this goes back many years. He apparently has been noted by the nurses to have increasing drainage from the left ankle wound and the patient is complaining of increasing pain I was asked to see him today because of this. I note that he is on Coumadin and he seems to been treated recently with Macrobid. I think this was for bacteria in his urine. The patient also has severe COPD but doesn't seem to be complaining about that today.  BMP Latest Ref Rng 07/29/2015 05/16/2015 04/17/2015  Glucose 65 - 99 mg/dL 086(V107(H) 784(O100(H) 962(X100(H)  BUN 6 - 20 mg/dL 52(W24(H) 41(L22(H) 24(M21(H)  Creatinine 0.61 - 1.24 mg/dL 0.101.04 2.721.02 5.360.83  Sodium 135 - 145 mmol/L 144 142 141  Potassium 3.5 - 5.1 mmol/L 4.1 4.1 4.3  Chloride 101 - 111 mmol/L 109 109 108  CO2 22 - 32 mmol/L 28 28 28   Calcium 8.9 - 10.3 mg/dL 6.4(Q8.6(L) 0.3(K8.8(L) 8.9    CBC Latest Ref Rng 07/29/2015 05/20/2015 05/19/2015  WBC 4.0 - 10.5 K/uL 9.9 7.8 9.5  Hemoglobin 13.0 - 17.0 g/dL 10.2(L) 9.8(L) 10.2(L)  Hematocrit 39.0 - 52.0 % 33.3(L) 31.8(L) 32.4(L)  Platelets 150 - 400 K/uL 223 308 298    Review of systems Respiratory; patient is not complaining of shortness of breath Cardiac no complaints of chest pain GI no nausea vomiting or diarrhea GU chronic Foley catheter in place, the patient followed with urology I don't think we are ever successful in removing this. Extremities; the patient is complaining of pain over his left ankle region.  Physical examination Gen. the patient is not in any distress talking coherently. Respiratory poor air entry bilaterally with expiratory wheezing however his work of breathing is normal. Cardiac heart sounds are regular.  2/6 systolic ejection murmur. His JVP is not elevated. Abdomen; obese no liver no spleen no tenderness GU Foley catheter in place no suprapubic or costovertebral angle tenderness Extremities he has severe bilateral venous hypertension however over the left lateral malleolus there is erythema and pain and drainage coming from a wound here. This isn't a very difficult spot. There is a fair amount of erythema which is tender. Cellulitis cannot be ruled out.  Impression/plan #1 venous insufficiency ulceration on the left lateral leg over the left lateral malleolus #2 coexistent cellulitis seems likely at the moment. I'm going to give him doxycycline; he does not have any listed antibiotic allergies.  I'm not sure what they're dressing this wound with. I'll change to silver alginate foam Kerlix Coban bilaterally. The right leg was carefully inspected there was no wound here.

## 2015-08-04 ENCOUNTER — Encounter: Payer: Self-pay | Admitting: Internal Medicine

## 2015-08-05 ENCOUNTER — Encounter (HOSPITAL_COMMUNITY)
Admission: RE | Admit: 2015-08-05 | Discharge: 2015-08-05 | Disposition: A | Discharge status: Home / Self Care | Payer: Medicare Other | Source: Skilled Nursing Facility | Attending: Anesthesiology | Admitting: Anesthesiology

## 2015-08-05 DIAGNOSIS — I739 Peripheral vascular disease, unspecified: Secondary | ICD-10-CM | POA: Diagnosis not present

## 2015-08-05 DIAGNOSIS — D649 Anemia, unspecified: Secondary | ICD-10-CM | POA: Diagnosis not present

## 2015-08-05 DIAGNOSIS — Z7901 Long term (current) use of anticoagulants: Secondary | ICD-10-CM | POA: Diagnosis not present

## 2015-08-05 LAB — PROTIME-INR
INR: 2.63 — AB (ref 0.00–1.49)
Prothrombin Time: 27.7 seconds — ABNORMAL HIGH (ref 11.6–15.2)

## 2015-08-08 ENCOUNTER — Encounter (HOSPITAL_COMMUNITY)
Admission: RE | Admit: 2015-08-08 | Discharge: 2015-08-08 | Disposition: A | Discharge status: Home / Self Care | Payer: Medicare Other | Source: Skilled Nursing Facility | Attending: Internal Medicine | Admitting: Internal Medicine

## 2015-08-08 DIAGNOSIS — I739 Peripheral vascular disease, unspecified: Secondary | ICD-10-CM | POA: Diagnosis not present

## 2015-08-08 DIAGNOSIS — Z7901 Long term (current) use of anticoagulants: Secondary | ICD-10-CM | POA: Diagnosis not present

## 2015-08-08 DIAGNOSIS — D649 Anemia, unspecified: Secondary | ICD-10-CM | POA: Diagnosis not present

## 2015-08-08 LAB — PROTIME-INR
INR: 2.26 — ABNORMAL HIGH (ref 0.00–1.49)
Prothrombin Time: 24.7 seconds — ABNORMAL HIGH (ref 11.6–15.2)

## 2015-08-12 ENCOUNTER — Other Ambulatory Visit (HOSPITAL_COMMUNITY)
Admission: RE | Admit: 2015-08-12 | Discharge: 2015-08-12 | Disposition: A | Discharge status: Home / Self Care | Payer: Medicare Other | Source: Skilled Nursing Facility | Attending: Internal Medicine | Admitting: Internal Medicine

## 2015-08-12 DIAGNOSIS — I482 Chronic atrial fibrillation: Secondary | ICD-10-CM | POA: Diagnosis not present

## 2015-08-12 LAB — PROTIME-INR
INR: 2.8 — AB (ref 0.00–1.49)
PROTHROMBIN TIME: 29.1 s — AB (ref 11.6–15.2)

## 2015-08-15 ENCOUNTER — Encounter (HOSPITAL_COMMUNITY)
Admission: RE | Admit: 2015-08-15 | Discharge: 2015-08-15 | Disposition: A | Discharge status: Home / Self Care | Payer: Medicare Other | Source: Skilled Nursing Facility | Attending: Internal Medicine | Admitting: Internal Medicine

## 2015-08-15 DIAGNOSIS — Z7901 Long term (current) use of anticoagulants: Secondary | ICD-10-CM | POA: Diagnosis not present

## 2015-08-15 DIAGNOSIS — D649 Anemia, unspecified: Secondary | ICD-10-CM | POA: Diagnosis not present

## 2015-08-15 DIAGNOSIS — I739 Peripheral vascular disease, unspecified: Secondary | ICD-10-CM | POA: Diagnosis not present

## 2015-08-15 LAB — PROTIME-INR
INR: 2.33 — AB (ref 0.00–1.49)
PROTHROMBIN TIME: 25.3 s — AB (ref 11.6–15.2)

## 2015-08-19 ENCOUNTER — Encounter (HOSPITAL_COMMUNITY)
Admission: RE | Admit: 2015-08-19 | Discharge: 2015-08-19 | Disposition: A | Discharge status: Home / Self Care | Payer: Medicare Other | Source: Skilled Nursing Facility | Attending: Internal Medicine | Admitting: Internal Medicine

## 2015-08-19 DIAGNOSIS — I739 Peripheral vascular disease, unspecified: Secondary | ICD-10-CM | POA: Diagnosis not present

## 2015-08-19 DIAGNOSIS — Z7901 Long term (current) use of anticoagulants: Secondary | ICD-10-CM | POA: Diagnosis not present

## 2015-08-19 DIAGNOSIS — D649 Anemia, unspecified: Secondary | ICD-10-CM | POA: Diagnosis not present

## 2015-08-19 LAB — PROTIME-INR
INR: 3.04 — AB (ref 0.00–1.49)
Prothrombin Time: 30.9 seconds — ABNORMAL HIGH (ref 11.6–15.2)

## 2015-08-21 ENCOUNTER — Encounter (HOSPITAL_COMMUNITY)
Admission: RE | Admit: 2015-08-21 | Discharge: 2015-08-21 | Disposition: A | Discharge status: Home / Self Care | Payer: Medicare Other | Source: Skilled Nursing Facility | Attending: Internal Medicine | Admitting: Internal Medicine

## 2015-08-21 ENCOUNTER — Ambulatory Visit (INDEPENDENT_AMBULATORY_CARE_PROVIDER_SITE_OTHER): Payer: Medicare Other | Admitting: Urology

## 2015-08-21 DIAGNOSIS — N401 Enlarged prostate with lower urinary tract symptoms: Secondary | ICD-10-CM | POA: Diagnosis not present

## 2015-08-21 DIAGNOSIS — Z7901 Long term (current) use of anticoagulants: Secondary | ICD-10-CM | POA: Diagnosis not present

## 2015-08-21 DIAGNOSIS — D649 Anemia, unspecified: Secondary | ICD-10-CM | POA: Diagnosis not present

## 2015-08-21 DIAGNOSIS — I739 Peripheral vascular disease, unspecified: Secondary | ICD-10-CM | POA: Diagnosis not present

## 2015-08-21 DIAGNOSIS — R351 Nocturia: Secondary | ICD-10-CM

## 2015-08-21 DIAGNOSIS — N390 Urinary tract infection, site not specified: Secondary | ICD-10-CM

## 2015-08-21 LAB — PROTIME-INR
INR: 2.53 — AB (ref 0.00–1.49)
Prothrombin Time: 27 seconds — ABNORMAL HIGH (ref 11.6–15.2)

## 2015-08-23 ENCOUNTER — Encounter (HOSPITAL_COMMUNITY)
Admission: RE | Admit: 2015-08-23 | Discharge: 2015-08-23 | Disposition: A | Discharge status: Home / Self Care | Payer: Medicare Other | Source: Skilled Nursing Facility | Attending: Internal Medicine | Admitting: Internal Medicine

## 2015-08-23 DIAGNOSIS — Z7901 Long term (current) use of anticoagulants: Secondary | ICD-10-CM | POA: Diagnosis not present

## 2015-08-23 DIAGNOSIS — I739 Peripheral vascular disease, unspecified: Secondary | ICD-10-CM | POA: Diagnosis not present

## 2015-08-23 DIAGNOSIS — D649 Anemia, unspecified: Secondary | ICD-10-CM | POA: Diagnosis not present

## 2015-08-23 LAB — PROTIME-INR
INR: 2.45 — ABNORMAL HIGH (ref 0.00–1.49)
Prothrombin Time: 26.3 seconds — ABNORMAL HIGH (ref 11.6–15.2)

## 2015-08-26 ENCOUNTER — Encounter (HOSPITAL_COMMUNITY)
Admission: RE | Admit: 2015-08-26 | Discharge: 2015-08-26 | Disposition: A | Discharge status: Home / Self Care | Payer: Medicare Other | Source: Skilled Nursing Facility | Attending: *Deleted | Admitting: *Deleted

## 2015-08-26 DIAGNOSIS — D649 Anemia, unspecified: Secondary | ICD-10-CM | POA: Diagnosis not present

## 2015-08-26 DIAGNOSIS — I739 Peripheral vascular disease, unspecified: Secondary | ICD-10-CM | POA: Diagnosis not present

## 2015-08-26 DIAGNOSIS — Z7901 Long term (current) use of anticoagulants: Secondary | ICD-10-CM | POA: Diagnosis not present

## 2015-08-26 LAB — PROTIME-INR
INR: 2.12 — AB (ref 0.00–1.49)
PROTHROMBIN TIME: 23.6 s — AB (ref 11.6–15.2)

## 2015-09-02 ENCOUNTER — Encounter (HOSPITAL_COMMUNITY)
Admission: RE | Admit: 2015-09-02 | Discharge: 2015-09-02 | Disposition: A | Discharge status: Home / Self Care | Payer: Medicare Other | Source: Skilled Nursing Facility | Attending: Internal Medicine | Admitting: Internal Medicine

## 2015-09-02 DIAGNOSIS — I10 Essential (primary) hypertension: Secondary | ICD-10-CM | POA: Insufficient documentation

## 2015-09-02 DIAGNOSIS — I482 Chronic atrial fibrillation: Secondary | ICD-10-CM | POA: Insufficient documentation

## 2015-09-02 DIAGNOSIS — R338 Other retention of urine: Secondary | ICD-10-CM | POA: Insufficient documentation

## 2015-09-02 DIAGNOSIS — Z7901 Long term (current) use of anticoagulants: Secondary | ICD-10-CM | POA: Insufficient documentation

## 2015-09-02 DIAGNOSIS — M1990 Unspecified osteoarthritis, unspecified site: Secondary | ICD-10-CM | POA: Insufficient documentation

## 2015-09-02 DIAGNOSIS — L03115 Cellulitis of right lower limb: Secondary | ICD-10-CM | POA: Insufficient documentation

## 2015-09-02 DIAGNOSIS — N401 Enlarged prostate with lower urinary tract symptoms: Secondary | ICD-10-CM | POA: Diagnosis not present

## 2015-09-02 DIAGNOSIS — I5032 Chronic diastolic (congestive) heart failure: Secondary | ICD-10-CM | POA: Diagnosis not present

## 2015-09-02 DIAGNOSIS — F3289 Other specified depressive episodes: Secondary | ICD-10-CM | POA: Insufficient documentation

## 2015-09-02 LAB — PROTIME-INR
INR: 1.87 — ABNORMAL HIGH (ref 0.00–1.49)
PROTHROMBIN TIME: 21.4 s — AB (ref 11.6–15.2)

## 2015-09-05 ENCOUNTER — Encounter (HOSPITAL_COMMUNITY)
Admission: RE | Admit: 2015-09-05 | Discharge: 2015-09-05 | Disposition: A | Discharge status: Home / Self Care | Payer: Medicare Other | Source: Skilled Nursing Facility | Attending: Internal Medicine | Admitting: Internal Medicine

## 2015-09-05 DIAGNOSIS — L03115 Cellulitis of right lower limb: Secondary | ICD-10-CM | POA: Diagnosis not present

## 2015-09-05 DIAGNOSIS — I10 Essential (primary) hypertension: Secondary | ICD-10-CM | POA: Diagnosis not present

## 2015-09-05 DIAGNOSIS — I5032 Chronic diastolic (congestive) heart failure: Secondary | ICD-10-CM | POA: Diagnosis not present

## 2015-09-05 DIAGNOSIS — R338 Other retention of urine: Secondary | ICD-10-CM | POA: Diagnosis not present

## 2015-09-05 DIAGNOSIS — I482 Chronic atrial fibrillation: Secondary | ICD-10-CM | POA: Diagnosis not present

## 2015-09-05 DIAGNOSIS — Z7901 Long term (current) use of anticoagulants: Secondary | ICD-10-CM | POA: Diagnosis not present

## 2015-09-05 DIAGNOSIS — N401 Enlarged prostate with lower urinary tract symptoms: Secondary | ICD-10-CM | POA: Diagnosis not present

## 2015-09-05 DIAGNOSIS — M1990 Unspecified osteoarthritis, unspecified site: Secondary | ICD-10-CM | POA: Diagnosis not present

## 2015-09-05 LAB — PROTIME-INR
INR: 1.68 — ABNORMAL HIGH (ref 0.00–1.49)
Prothrombin Time: 19.8 seconds — ABNORMAL HIGH (ref 11.6–15.2)

## 2015-09-08 ENCOUNTER — Inpatient Hospital Stay (HOSPITAL_COMMUNITY): Payer: Medicare Other | Attending: Internal Medicine

## 2015-09-08 DIAGNOSIS — R05 Cough: Secondary | ICD-10-CM | POA: Diagnosis not present

## 2015-09-10 ENCOUNTER — Encounter: Payer: Self-pay | Admitting: Internal Medicine

## 2015-09-10 ENCOUNTER — Non-Acute Institutional Stay (SKILLED_NURSING_FACILITY): Payer: Medicare Other | Admitting: Internal Medicine

## 2015-09-10 DIAGNOSIS — N401 Enlarged prostate with lower urinary tract symptoms: Secondary | ICD-10-CM | POA: Diagnosis not present

## 2015-09-10 DIAGNOSIS — I4891 Unspecified atrial fibrillation: Secondary | ICD-10-CM | POA: Diagnosis not present

## 2015-09-10 DIAGNOSIS — I714 Abdominal aortic aneurysm, without rupture, unspecified: Secondary | ICD-10-CM

## 2015-09-10 DIAGNOSIS — R062 Wheezing: Secondary | ICD-10-CM | POA: Diagnosis not present

## 2015-09-10 NOTE — Progress Notes (Signed)
Patient ID: [name], male   DOB: [birth year], 82 y.o.   MRN: [id number]  Location:  [facility]   Place of Service:  SNF (31)  [name], Joel MillardSTEPHEN M, MD  Patient Care Team: Frederica KusterStephen Mays [name], MD as PCP - General (Family Medicine) Bjorn PippinJohn Wrenn, MD as Attending Physician (Urology)  Extended Emergency Contact Information Primary Emergency Contact: [name],[name] Address: [address]          [place], [place] [address] [place] Home Phone: [phone] Mobile Phone: [phone] Relation: Daughter Secondary Emergency Contact: Poyser,[name] Address: [address]          [place], [place] [address] [place] Home Phone: [phone] Mobile Phone: [phone] Relation: Son   Goals of care: Advanced Directive information Advanced Directives 09/10/2015  Does patient have an advance directive? Yes  Type of Advance Directive Out of facility DNR (pink MOST or yellow form)  Does patient want to make changes to advanced directive? No - Patient declined  Copy of advanced directive(s) in chart? Yes  Would patient like information on creating an advanced directive? -     Chief Complaint  Patient presents with  . Acute Visit   Secondary to wheezing.  Medical management of chronic issues including obstructive uropathy atrial fibrillation diastolic CHF and COPD.   HPI:  Pt is a 82 y.o. male seen today for an acute visit for increased wheezing he does have a history of COPD-history labs have been made routine 3 times a day-it appears there may be some allergy symptoms going on here as well apparently has been outside recently.  He does not complain of any acute shortness of breath but does appear to not be feeling quite up to par some runny nose and eyes.  Vital signs are stable he is afebrile O2 sats saturation is in the low 90s.  Regards L her medical issues he appears to be stable he does have a history of obstructive uropathy is followed closely by urology  with a history of catheter-the recently did discontinue his Proscar and started him on Hipex--this apparently has stabilized somewhat.  He also has a history of lower extremity cellulitis in the past has received a course of doxycycline which she has completed this appears to be looking better on his left ankle area.  He does have a history of diastolic CHF but this appears stable he is not currently on a diuretic his weights have been stable currently 305 pounds.  He does have a history of atrial fibrillation this appears rate controlled on metoprolol and Cardizem continues on Coumadin for anticoagulation INR was subtherapeutic at 1.68 on April 6 Coumadin dose has been increased and update INR is pending.  He does have a history of hypertension recent blood pressures appear stable 108/69-119/64 he is on Cardizem 120 mg a day as well as Lopressor 12.5  mg twice a day and Cardura 8 mg today  Patient also has a history of depression he is on Zoloft this appears to be relatively stable.  He does have history of anemia of chronic disease hemoglobin back in February was stable at 10.2.     Past Medical History  Diagnosis Date  . Venous insufficiency   . AAA (abdominal aortic aneurysm) (HCC)   . Ulcer   . DVT (deep venous thrombosis) (HCC) 05/04/2013    "LLE"  . Cellulitis   . Nicotine dependence   . Bronchospasm   . Elevated PSA   . BPH (benign  prostatic hyperplasia)   . COPD (chronic obstructive pulmonary disease) (HCC)   . Cognitive impairment   . Daily headache   . Arthritis     "probably on the right leg/hip" (09/20/2013)  . Dementia     "don't know exactly what kind" (09/20/2013)  . Kidney stones   . Glaucoma, both eyes   . Atrial fibrillation (HCC)   . Fall at home September 18, 2013  . Benign localized hyperplasia of prostate with urinary retention 12/23/2014  . AAA (abdominal aortic aneurysm) without rupture (HCC) 10/06/2012  . Anticoagulation goal of INR 2 to 3 06/01/2013  . B12  deficiency 09/20/2013  . Bladder calculus 12/23/2014    2.6cm and present since at least 2011 when it was about 1.6cm.   . Cardiomyopathy (HCC) 10/15/2014  . CHF (congestive heart failure) (HCC) 09/26/2013  . Chronic venous insufficiency 10/06/2012  . Edema-Bilat Leg and foot 03/07/2013  . Multifactorial dementia 10/06/2012  . Obesity, unspecified 10/06/2012  . Peripheral vascular disease (HCC) 09/20/2012  . Pyelonephritis, acute 12/22/2014  . Weakness 09/18/2013  . Wound cellulitis     let ankle wound size of half dollar per nurse at [facility] on 03/25/2015   Past Surgical History  Procedure Laterality Date  . Cataract extraction w/ intraocular lens  implant, bilateral Bilateral   . Insitu percutaneous pinning femur Right 1986  . Hip fracture surgery Right 2009  . Back surgery    . Posterior laminectomy / decompression lumbar spine  2004    Decompressive laminectomy unilaterally on the right at L4-5 with  . Femur fracture surgery Right 04/1985  . Cystoscopy w/ stone manipulation  1970's    "once"  . Spine surgery    . Eye surgery    . Cholecystectomy  2011    Gall Bladder  . Cystoscopy with litholapaxy N/A 03/28/2015    Procedure: CYSTOSCOPY WITH LITHOLAPAXY;  Surgeon: Malen Gauze, MD;  Location: [facility];  Service: Urology;  Laterality: N/A;  . Transurethral resection of prostate N/A 03/28/2015    Procedure: TRANSURETHRAL RESECTION OF THE PROSTATE ;  Surgeon: Malen Gauze, MD;  Location: [facility];  Service: Urology;  Laterality: N/A;    Allergies  Allergen Reactions  . Oxycodone Hcl Other (See Comments)     makes pt "wild"  . Propoxyphene N-Acetaminophen Other (See Comments)     Makes pt. "wild"   Medications have been reviewed and include Zoloft 75 mg a day.  Lopressor 12.5   mg twice a day.  Coumadin 5 mg daily.  Cardizem 120 mg daily.  Cardura 8 mg a day.  Hiprex 1 gm twice a day.  Vicodin 5-3 25 mg every 4 hours when necessary.  Duo nebs 3 times a  day and 4 times a day when necessary.     Review of Systems   In general no complaints of fever chills weight has been stable.  Skin does not complain of rashes or itching does have a history of left lower extremity cellulitis at times recently completed doxycycline this appears to be improved.  Ears eyes nose mouth and throat does appear to have some clear drainage from his eyes nose question allergy symptoms.  X-ray does not complain of shortness of breath or cough with history of COPD.  Cardiac does not complain of chest pain.  Has chronic lymphedema.  GI does not complain of nausea vomiting diarrhea or constipation or abdominal discomfort.  GU is not complaining of dysuria does have a  history of obstructive uropathy followed by urology.  Musculoskeletal ambulates largely in a wheelchair is able to stand with assistance I believe in restorative-does not complain of joint pain at this time does have lower extremity weakness lymphedema.  Neurologic is not complaining of dizziness or headache.  Psych this appears relatively stable he does have a history of depression but appears relatively at baseline does have some cognitive deficits but largely pleasant and appropriate.    Immunization History  Administered Date(s) Administered  . Influenza,inj,Quad PF,36+ Mos 03/20/2013, 03/23/2014, 02/11/2015  . Pneumococcal Polysaccharide-23 03/02/2007, 02/11/2015  . Td 12/30/1996   Pertinent  Health Maintenance Due  Topic Date Due  . INFLUENZA VACCINE  12/31/2015  . PNA vac Low Risk Adult (2 of 2 - PCV13) 02/11/2016   Fall Risk  01/04/2015 04/18/2014 08/15/2013  Falls in the past year? Yes Yes Yes  Number falls in past yr: 2 or more 1 2 or more  Injury with Fall? No Yes -  Risk Factor Category  - High Fall Risk High Fall Risk  Risk for fall due to : Impaired balance/gait;Impaired mobility Impaired mobility;Mental status change History of fall(s);Impaired balance/gait;Impaired  mobility;Mental status change   Functional Status Survey:    Filed Vitals:   09/10/15 1438  BP: 108/63  Pulse: 76  Temp: 98.2 F (36.8 C)  TempSrc: Oral  Resp: 18  Height:  (1.93 Mays)  Weight: 305 lb 8 oz (138.574 kg)   Body mass index is 37.2 kg/(Mays^2). Physical Exam   Temperature is 98.1 pulse 76 respirations 18 blood pressure 108/63 O2 saturation is 9093% weight is stable at 305.  In general this is a elderly male in no distress sitting comfortably in his wheelchair.  His skin is warm and dry he does have venous stasis changes to his legs bilaterally erythema left lower extremity appears to be improved per nursing staff.  Eyes pupils appear reactive to light there appears to be some clear drainage visual acuity appears intact sclera and conjunctivae are relatively clear.  Oropharynx clear mucous membranes moist again appears to have possibly some clear drainage.  Chest he does have some diffuse expiratory wheezing there is no labored breathing no sign of distress.  Heart is regular rate and rhythm although distant heart sounds without murmur gallop or rub he has baseline venous stasis changes lymphedema legs are currently wrapped bilaterally  Abdomen somewhat obese soft nontender with positive bowel sounds.  GU he does have an indwelling Foley catheter does not have overt suprapubic tenderness  Muscle skeletal ambulating largely in a wheelchair moves extremities at baseline has extensive lower extremity weakness with significant lymphedema.  Neurologic is grossly intact speech is clear.  Psych he is oriented to self pleasant and appropriate has confusion at times which is baseline.    Labs reviewed:  Recent Labs  04/17/15 0800 05/16/15 0725 07/29/15 0630  NA 141 142 144  K 4.3 4.1 4.1  CL 108 109 109  CO2 GLUCOSE 100* 100* 107*  BUN 21* 22* 24*  CREATININE 0.83 1.02 1.04  CALCIUM 8.9 8.8* 8.6*    Recent Labs  02/10/15 1120 02/11/15 0627  04/17/15 0800  AST ALT 18 18 10*  ALKPHOS 74 64 77  BILITOT 1.3* 0.6 0.6  PROT 6.7 6.0* 6.6  ALBUMIN 2.9* 2.7* 3.0*    Recent Labs  05/19/15 0435 05/20/15 0700 07/29/15 0630  WBC 9.5 7.8 9.9  NEUTROABS 5.2 4.0 5.7  HGB  10.2* 9.8* 10.2*  HCT 32.4* 31.8* 33.3*  MCV 87.1 86.6 84.7  PLT 298 308 223   Lab Results  Component Value Date   TSH 1.960 09/22/2013   Lab Results  Component Value Date   HGBA1C 5.8* 09/19/2013   Lab Results  Component Value Date   CHOL 157 10/06/2012   HDL 34* 10/06/2012   LDLCALC 96 10/06/2012   TRIG 135 10/06/2012   CHOLHDL 4.6 10/06/2012    Significant Diagnostic Results in last 30 days:  Dg Chest 1 View  09/08/2015  CLINICAL DATA:  Cough and congestion. EXAM: CHEST 1 VIEW COMPARISON:  02/10/2015 FINDINGS: Cardiac silhouette is normal in size. No mediastinal or hilar masses or convincing adenopathy. Mild reticular opacity noted at the lung bases consistent with scarring and/ or atelectasis. Mild scarring noted in the upper lobes. No evidence of pneumonia or edema. No pleural effusion or pneumothorax. Bony thorax is grossly intact. IMPRESSION: No acute cardiopulmonary disease. Electronically Signed   By: Amie Portland Mays.D.   On: 09/08/2015 14:55    Assessment/Plan  #1-history of COPD with wheezing-he is on duo nebs 3 times a day as well as 4 times a day as needed-this will have to be encouraged-also will start a short course of prednisone 20 mg twice a day for 5 days this was discussed with Dr. Alwyn Ren.  Clinically appears stable but will have to be monitored while signs pulse ox every shift for now.  He's also been started on Mucinex-and will start Claritin for 5 day course 10 mg.  #2-history of atrial fibrillation this appears rate controlled on metoprolol as well as Cardizem INR is slightly subtherapeutic dose adjustments have been made update INR is pending.  #3 history CHF at this point appears clinically stable lymphedema weight  appears to be stable and not on a diuretic currently at this point monitor.  #4 history of chronic renal failure recent creatinine of 1.04 appears to be relatively baseline and stable will update this.  #5 history DVT again he is on chronic anticoagulation as noted above with Coumadin.  #6 history hypertension again this appears stable on Lopressor Cardizem and Provera as noted above.  #7-history of anemia this appears relatively baseline at 10.2 will update this as well.  Rate depression he is on Zoloft this appears to be relatively stable.  #9 history of abdominal aorta hours - this has been followed by vascular CT scan in July 2016 showed an unchanged 4.4 cm in diameter AAA recommendation to recheck this later this year with an ultrasound  [id number]-of note greater than 40 minutes spent assessing patient-reviewing his chart-and coordinating formulating a plan of care for numerous diagnoses-of note greater than 50% of time spent coordinating plan of care  #8 history of obstructive uropathy this is followed by urology again medication changes as been made he no longer on Proscar he is on Hipex

## 2015-09-11 ENCOUNTER — Encounter (HOSPITAL_COMMUNITY)
Admission: RE | Admit: 2015-09-11 | Discharge: 2015-09-11 | Disposition: A | Discharge status: Home / Self Care | Payer: Medicare Other | Source: Skilled Nursing Facility | Attending: Internal Medicine | Admitting: Internal Medicine

## 2015-09-11 DIAGNOSIS — I5032 Chronic diastolic (congestive) heart failure: Secondary | ICD-10-CM | POA: Diagnosis not present

## 2015-09-11 DIAGNOSIS — L03115 Cellulitis of right lower limb: Secondary | ICD-10-CM | POA: Diagnosis not present

## 2015-09-11 DIAGNOSIS — I10 Essential (primary) hypertension: Secondary | ICD-10-CM | POA: Diagnosis not present

## 2015-09-11 DIAGNOSIS — Z7901 Long term (current) use of anticoagulants: Secondary | ICD-10-CM | POA: Diagnosis not present

## 2015-09-11 DIAGNOSIS — I482 Chronic atrial fibrillation: Secondary | ICD-10-CM | POA: Diagnosis not present

## 2015-09-11 DIAGNOSIS — N401 Enlarged prostate with lower urinary tract symptoms: Secondary | ICD-10-CM | POA: Diagnosis not present

## 2015-09-11 DIAGNOSIS — R338 Other retention of urine: Secondary | ICD-10-CM | POA: Diagnosis not present

## 2015-09-11 DIAGNOSIS — M1990 Unspecified osteoarthritis, unspecified site: Secondary | ICD-10-CM | POA: Diagnosis not present

## 2015-09-11 LAB — BASIC METABOLIC PANEL
Anion gap: 5 (ref 5–15)
BUN: 28 mg/dL — ABNORMAL HIGH (ref 6–20)
CO2: 28 mmol/L (ref 22–32)
Calcium: 8.6 mg/dL — ABNORMAL LOW (ref 8.9–10.3)
Chloride: 110 mmol/L (ref 101–111)
Creatinine, Ser: 1.05 mg/dL (ref 0.61–1.24)
GFR calc Af Amer: >60 mL/min (ref >60)
GFR calc non Af Amer: >60 mL/min (ref >60)
Glucose, Bld: 103 mg/dL — ABNORMAL HIGH (ref 65–99)
Potassium: 4.2 mmol/L (ref 3.5–5.1)
Sodium: 143 mmol/L (ref 135–145)

## 2015-09-11 LAB — CBC WITH DIFFERENTIAL/PLATELET
BASOS ABS: 0.1 10*3/uL (ref 0.0–0.1)
BASOS PCT: 1 %
Eosinophils Absolute: 1.2 10*3/uL — ABNORMAL HIGH (ref 0.0–0.7)
Eosinophils Relative: 18 %
HEMATOCRIT: 33.9 % — AB (ref 39.0–52.0)
HEMOGLOBIN: 10.7 g/dL — AB (ref 13.0–17.0)
LYMPHS PCT: 28 %
Lymphs Abs: 1.9 10*3/uL (ref 0.7–4.0)
MCH: 26.9 pg (ref 26.0–34.0)
MCHC: 31.6 g/dL (ref 30.0–36.0)
MCV: 85.2 fL (ref 78.0–100.0)
Monocytes Absolute: 0.9 10*3/uL (ref 0.1–1.0)
Monocytes Relative: 12 %
NEUTROS ABS: 2.9 10*3/uL (ref 1.7–7.7)
NEUTROS PCT: 41 %
Platelets: 227 10*3/uL (ref 150–400)
RBC: 3.98 MIL/uL — AB (ref 4.22–5.81)
RDW: 19.4 % — ABNORMAL HIGH (ref 11.5–15.5)
WBC: 7 10*3/uL (ref 4.0–10.5)

## 2015-09-12 ENCOUNTER — Encounter (HOSPITAL_COMMUNITY)
Admission: AD | Admit: 2015-09-12 | Discharge: 2015-09-12 | Disposition: A | Discharge status: Home / Self Care | Payer: Medicare Other | Source: Skilled Nursing Facility | Attending: Internal Medicine | Admitting: Internal Medicine

## 2015-09-12 ENCOUNTER — Other Ambulatory Visit: Payer: Self-pay | Admitting: Internal Medicine

## 2015-09-12 DIAGNOSIS — N401 Enlarged prostate with lower urinary tract symptoms: Secondary | ICD-10-CM | POA: Diagnosis not present

## 2015-09-12 DIAGNOSIS — M1990 Unspecified osteoarthritis, unspecified site: Secondary | ICD-10-CM | POA: Diagnosis not present

## 2015-09-12 DIAGNOSIS — R338 Other retention of urine: Secondary | ICD-10-CM | POA: Diagnosis not present

## 2015-09-12 DIAGNOSIS — I5032 Chronic diastolic (congestive) heart failure: Secondary | ICD-10-CM | POA: Diagnosis not present

## 2015-09-12 DIAGNOSIS — E538 Deficiency of other specified B group vitamins: Secondary | ICD-10-CM

## 2015-09-12 DIAGNOSIS — L03115 Cellulitis of right lower limb: Secondary | ICD-10-CM | POA: Diagnosis not present

## 2015-09-12 DIAGNOSIS — I482 Chronic atrial fibrillation: Secondary | ICD-10-CM | POA: Diagnosis not present

## 2015-09-12 DIAGNOSIS — I10 Essential (primary) hypertension: Secondary | ICD-10-CM | POA: Diagnosis not present

## 2015-09-12 DIAGNOSIS — Z7901 Long term (current) use of anticoagulants: Secondary | ICD-10-CM | POA: Diagnosis not present

## 2015-09-12 LAB — PROTIME-INR
INR: 1.84 — AB (ref 0.00–1.49)
Prothrombin Time: 21.2 seconds — ABNORMAL HIGH (ref 11.6–15.2)

## 2015-09-12 NOTE — Assessment & Plan Note (Signed)
Check B12 level. 

## 2015-09-13 ENCOUNTER — Encounter (HOSPITAL_COMMUNITY)
Admission: RE | Admit: 2015-09-13 | Discharge: 2015-09-13 | Disposition: A | Discharge status: Home / Self Care | Payer: Medicare Other | Source: Skilled Nursing Facility | Attending: Internal Medicine | Admitting: Internal Medicine

## 2015-09-13 DIAGNOSIS — L03115 Cellulitis of right lower limb: Secondary | ICD-10-CM | POA: Diagnosis not present

## 2015-09-13 DIAGNOSIS — N401 Enlarged prostate with lower urinary tract symptoms: Secondary | ICD-10-CM | POA: Diagnosis not present

## 2015-09-13 DIAGNOSIS — I482 Chronic atrial fibrillation: Secondary | ICD-10-CM | POA: Diagnosis not present

## 2015-09-13 DIAGNOSIS — I5032 Chronic diastolic (congestive) heart failure: Secondary | ICD-10-CM | POA: Diagnosis not present

## 2015-09-13 DIAGNOSIS — R338 Other retention of urine: Secondary | ICD-10-CM | POA: Diagnosis not present

## 2015-09-13 DIAGNOSIS — I10 Essential (primary) hypertension: Secondary | ICD-10-CM | POA: Diagnosis not present

## 2015-09-13 DIAGNOSIS — M1990 Unspecified osteoarthritis, unspecified site: Secondary | ICD-10-CM | POA: Diagnosis not present

## 2015-09-13 DIAGNOSIS — Z7901 Long term (current) use of anticoagulants: Secondary | ICD-10-CM | POA: Diagnosis not present

## 2015-09-13 LAB — VITAMIN B12: VITAMIN B 12: 392 pg/mL (ref 180–914)

## 2015-09-15 DIAGNOSIS — R062 Wheezing: Secondary | ICD-10-CM | POA: Insufficient documentation

## 2015-09-15 NOTE — Progress Notes (Signed)
Patient ID: Waymond CeraBryce A Corwin, male   DOB: 1931/07/29, 82 y.o.   MRN: [id number]

## 2015-09-16 ENCOUNTER — Encounter (HOSPITAL_COMMUNITY)
Admission: RE | Admit: 2015-09-16 | Discharge: 2015-09-16 | Disposition: A | Discharge status: Home / Self Care | Payer: Medicare Other | Source: Skilled Nursing Facility | Attending: Internal Medicine | Admitting: Internal Medicine

## 2015-09-16 DIAGNOSIS — R338 Other retention of urine: Secondary | ICD-10-CM | POA: Diagnosis not present

## 2015-09-16 DIAGNOSIS — M1990 Unspecified osteoarthritis, unspecified site: Secondary | ICD-10-CM

## 2015-09-16 DIAGNOSIS — N401 Enlarged prostate with lower urinary tract symptoms: Secondary | ICD-10-CM | POA: Diagnosis not present

## 2015-09-16 DIAGNOSIS — I5032 Chronic diastolic (congestive) heart failure: Secondary | ICD-10-CM | POA: Diagnosis not present

## 2015-09-16 DIAGNOSIS — L03115 Cellulitis of right lower limb: Secondary | ICD-10-CM | POA: Diagnosis not present

## 2015-09-16 DIAGNOSIS — I10 Essential (primary) hypertension: Secondary | ICD-10-CM | POA: Diagnosis not present

## 2015-09-16 DIAGNOSIS — Z7901 Long term (current) use of anticoagulants: Secondary | ICD-10-CM | POA: Diagnosis not present

## 2015-09-16 DIAGNOSIS — I482 Chronic atrial fibrillation: Secondary | ICD-10-CM | POA: Diagnosis not present

## 2015-09-16 LAB — VITAMIN B12: Vitamin B-12: 440 pg/mL (ref 180–914)

## 2015-09-17 ENCOUNTER — Other Ambulatory Visit (HOSPITAL_COMMUNITY)
Admission: AD | Admit: 2015-09-17 | Discharge: 2015-09-17 | Disposition: A | Discharge status: Home / Self Care | Payer: Medicare Other | Source: Other Acute Inpatient Hospital | Attending: Internal Medicine | Admitting: Internal Medicine

## 2015-09-17 ENCOUNTER — Encounter: Payer: Self-pay | Admitting: Internal Medicine

## 2015-09-17 ENCOUNTER — Non-Acute Institutional Stay (SKILLED_NURSING_FACILITY): Payer: Medicare Other | Admitting: Internal Medicine

## 2015-09-17 DIAGNOSIS — J209 Acute bronchitis, unspecified: Secondary | ICD-10-CM | POA: Diagnosis not present

## 2015-09-17 DIAGNOSIS — J441 Chronic obstructive pulmonary disease with (acute) exacerbation: Secondary | ICD-10-CM

## 2015-09-17 DIAGNOSIS — I482 Chronic atrial fibrillation, unspecified: Secondary | ICD-10-CM

## 2015-09-17 DIAGNOSIS — I420 Dilated cardiomyopathy: Secondary | ICD-10-CM | POA: Diagnosis not present

## 2015-09-17 DIAGNOSIS — N390 Urinary tract infection, site not specified: Secondary | ICD-10-CM | POA: Diagnosis not present

## 2015-09-17 DIAGNOSIS — R509 Fever, unspecified: Secondary | ICD-10-CM

## 2015-09-17 DIAGNOSIS — J111 Influenza due to unidentified influenza virus with other respiratory manifestations: Secondary | ICD-10-CM | POA: Diagnosis not present

## 2015-09-17 LAB — URINALYSIS, ROUTINE W REFLEX MICROSCOPIC
Glucose, UA: NEGATIVE mg/dL
NITRITE: NEGATIVE
PH: 5.5 (ref 5.0–8.0)
PROTEIN: 30 mg/dL — AB
Specific Gravity, Urine: 1.015 (ref 1.005–1.030)

## 2015-09-17 LAB — URINE MICROSCOPIC-ADD ON

## 2015-09-17 LAB — INFLUENZA PANEL BY PCR (TYPE A & B)
H1N1 flu by pcr: NOT DETECTED
INFLBPCR: NEGATIVE
Influenza A By PCR: NEGATIVE

## 2015-09-17 NOTE — Progress Notes (Signed)
Patient ID: [name], male   DOB: 1932/03/14, 82 y.o.   MRN: [id number]    This is a nursing facility follow up for specific acute issue of fever up to 100 F today.  Interim medical record and care since last [facility] visit was updated with review of diagnostic studies and change in clinical status since last visit were documented. He has a history of COPD; cardiomyopathy; atrial fibrillation;& obstructive uropathy with recurrent urinary tract infections. He now has an indwelling Foley.   Note: family and social history not pertinent to this assessment; he quit smoking in  2004.  HPI: He was noted have a temperature of 100 earlier today; he was unaware of the fever. He has discomfort in the frontal  & maxillary sinus areas; he also describes a cough productive of yellow sputum for the last several days or longer.Chest x-ray 4/9 for cough and congestion revealed no acute cardiopulmonary disease. He has nebulizer treatments ordered as needed; he has not been receiving these.  ROS: Nasal purulence, sore throat , otic pain or otic discharge denied.  Hemoptysis or pleuritic pain denied.  No diarrhea present.Cola colored urine or clay colored stools denied.  Dysuria, pyuria, or hematuria not present.  No new rashes, pustules, vesicles.  No redness or swelling of joints.   Physical exam:  Pertinent or positive findings: Ptosis present on the left. He is wearing an upper plate;he is not wearing a lower plate. Heart rate and rhythm are irregular. Heart sounds are distant. He has a loose productive cough. He has mild rhonchi in a scattered distribution; this finding is greatest in the right mid to lower lung field. 1+ pitting edema of the lower extremities. Pedal pulses are decreased. He exhibits a resting pill-rolling tremor bilaterally.  General appearance :adequately nourished; in no distress.  Eyes: No conjunctival inflammation or scleral icterus is present.  Oral exam:   Lips and gums are healthy appearing.There is no oropharyngeal erythema or exudate noted.  Heart:  No gallop, murmur, click, rub or other extra sounds    Abdomen: bowel sounds normal, soft and non-tender without masses, organomegaly or hernias noted.  No guarding or rebound. No flank tenderness to percussion. Indwelling Foley catheter  Vascular : all pulses equal ; no bruits present.  Skin:Warm & dry.  Intact without suspicious lesions or rashes ; no tenting or jaundice   Lymphatic: No lymphadenopathy is noted about the head, neck, axilla.   Neuro: Strength, tone generally decreased.  Assessment:#1 fever  #2 symptoms of acute bronchitis  #3 history of COPD; probable exacerbation  #4 history of recurrent urinary tract infections; indwelling Foley catheter present  #5 history of dilated cardiomyopathy; repeat chest x-ray with BNP If he fails to improve with aggressive pulmonary toilet and the Augmentin   #6 warfarin anticoagulation; Augmentin should not adversely affect the PT/INR  Plan: See orders and recommendations

## 2015-09-17 NOTE — Patient Instructions (Addendum)
orders for [employer] entry:  Augmentin 875/125 every 12 hours after meals 7 days UA Urine C&S Make nebulizer treatments every 6 hours while awake 5 days; then as needed

## 2015-09-19 ENCOUNTER — Encounter (HOSPITAL_COMMUNITY)
Admission: RE | Admit: 2015-09-19 | Discharge: 2015-09-19 | Disposition: A | Discharge status: Home / Self Care | Payer: Medicare Other | Source: Skilled Nursing Facility | Attending: Internal Medicine | Admitting: Internal Medicine

## 2015-09-19 DIAGNOSIS — L03115 Cellulitis of right lower limb: Secondary | ICD-10-CM | POA: Diagnosis not present

## 2015-09-19 DIAGNOSIS — I482 Chronic atrial fibrillation: Secondary | ICD-10-CM | POA: Diagnosis not present

## 2015-09-19 DIAGNOSIS — R338 Other retention of urine: Secondary | ICD-10-CM | POA: Diagnosis not present

## 2015-09-19 DIAGNOSIS — I10 Essential (primary) hypertension: Secondary | ICD-10-CM | POA: Diagnosis not present

## 2015-09-19 DIAGNOSIS — Z7901 Long term (current) use of anticoagulants: Secondary | ICD-10-CM | POA: Diagnosis not present

## 2015-09-19 DIAGNOSIS — M1990 Unspecified osteoarthritis, unspecified site: Secondary | ICD-10-CM | POA: Diagnosis not present

## 2015-09-19 DIAGNOSIS — N401 Enlarged prostate with lower urinary tract symptoms: Secondary | ICD-10-CM | POA: Diagnosis not present

## 2015-09-19 DIAGNOSIS — I5032 Chronic diastolic (congestive) heart failure: Secondary | ICD-10-CM | POA: Diagnosis not present

## 2015-09-19 LAB — PROTIME-INR
INR: 2.11 — ABNORMAL HIGH (ref 0.00–1.49)
Prothrombin Time: 23.5 seconds — ABNORMAL HIGH (ref 11.6–15.2)

## 2015-09-20 LAB — URINE CULTURE: Culture: >=100000 — AB

## 2015-09-24 ENCOUNTER — Encounter (HOSPITAL_COMMUNITY)
Admission: RE | Admit: 2015-09-24 | Discharge: 2015-09-24 | Disposition: A | Discharge status: Home / Self Care | Payer: Medicare Other | Source: Skilled Nursing Facility | Attending: Internal Medicine | Admitting: Internal Medicine

## 2015-09-25 ENCOUNTER — Encounter (HOSPITAL_COMMUNITY)
Admission: RE | Admit: 2015-09-25 | Discharge: 2015-09-25 | Disposition: A | Discharge status: Home / Self Care | Payer: Medicare Other | Source: Skilled Nursing Facility | Attending: Internal Medicine | Admitting: Internal Medicine

## 2015-09-25 DIAGNOSIS — Z7901 Long term (current) use of anticoagulants: Secondary | ICD-10-CM | POA: Diagnosis not present

## 2015-09-25 DIAGNOSIS — I10 Essential (primary) hypertension: Secondary | ICD-10-CM | POA: Diagnosis not present

## 2015-09-25 DIAGNOSIS — N401 Enlarged prostate with lower urinary tract symptoms: Secondary | ICD-10-CM | POA: Diagnosis not present

## 2015-09-25 DIAGNOSIS — L03115 Cellulitis of right lower limb: Secondary | ICD-10-CM | POA: Diagnosis not present

## 2015-09-25 DIAGNOSIS — R338 Other retention of urine: Secondary | ICD-10-CM | POA: Diagnosis not present

## 2015-09-25 DIAGNOSIS — M1990 Unspecified osteoarthritis, unspecified site: Secondary | ICD-10-CM | POA: Diagnosis not present

## 2015-09-25 DIAGNOSIS — I5032 Chronic diastolic (congestive) heart failure: Secondary | ICD-10-CM | POA: Diagnosis not present

## 2015-09-25 DIAGNOSIS — I482 Chronic atrial fibrillation: Secondary | ICD-10-CM | POA: Diagnosis not present

## 2015-09-25 LAB — PROTIME-INR
INR: 2.91 — AB (ref 0.00–1.49)
Prothrombin Time: 29.9 seconds — ABNORMAL HIGH (ref 11.6–15.2)

## 2015-09-27 ENCOUNTER — Non-Acute Institutional Stay (SKILLED_NURSING_FACILITY): Payer: Medicare Other | Admitting: Internal Medicine

## 2015-09-27 DIAGNOSIS — I4891 Unspecified atrial fibrillation: Secondary | ICD-10-CM

## 2015-09-27 DIAGNOSIS — I509 Heart failure, unspecified: Secondary | ICD-10-CM

## 2015-09-27 DIAGNOSIS — J42 Unspecified chronic bronchitis: Secondary | ICD-10-CM | POA: Diagnosis not present

## 2015-09-27 DIAGNOSIS — R059 Cough, unspecified: Secondary | ICD-10-CM

## 2015-09-27 DIAGNOSIS — R05 Cough: Secondary | ICD-10-CM

## 2015-09-27 DIAGNOSIS — R0989 Other specified symptoms and signs involving the circulatory and respiratory systems: Secondary | ICD-10-CM | POA: Diagnosis not present

## 2015-09-28 ENCOUNTER — Encounter (HOSPITAL_COMMUNITY)
Admission: RE | Admit: 2015-09-28 | Discharge: 2015-09-28 | Disposition: A | Discharge status: Home / Self Care | Payer: Medicare Other | Source: Skilled Nursing Facility | Attending: Internal Medicine | Admitting: Internal Medicine

## 2015-09-28 DIAGNOSIS — R338 Other retention of urine: Secondary | ICD-10-CM | POA: Diagnosis not present

## 2015-09-28 DIAGNOSIS — I10 Essential (primary) hypertension: Secondary | ICD-10-CM | POA: Diagnosis not present

## 2015-09-28 DIAGNOSIS — I5032 Chronic diastolic (congestive) heart failure: Secondary | ICD-10-CM | POA: Diagnosis not present

## 2015-09-28 DIAGNOSIS — Z7901 Long term (current) use of anticoagulants: Secondary | ICD-10-CM | POA: Diagnosis not present

## 2015-09-28 DIAGNOSIS — I482 Chronic atrial fibrillation: Secondary | ICD-10-CM | POA: Diagnosis not present

## 2015-09-28 DIAGNOSIS — N401 Enlarged prostate with lower urinary tract symptoms: Secondary | ICD-10-CM | POA: Diagnosis not present

## 2015-09-28 DIAGNOSIS — M1990 Unspecified osteoarthritis, unspecified site: Secondary | ICD-10-CM | POA: Diagnosis not present

## 2015-09-28 DIAGNOSIS — L03115 Cellulitis of right lower limb: Secondary | ICD-10-CM | POA: Diagnosis not present

## 2015-09-28 LAB — CBC WITH DIFFERENTIAL/PLATELET
Basophils Absolute: 0 10*3/uL (ref 0.0–0.1)
Basophils Relative: 1 %
EOS ABS: 0.8 10*3/uL — AB (ref 0.0–0.7)
EOS PCT: 10 %
HEMATOCRIT: 34.7 % — AB (ref 39.0–52.0)
Hemoglobin: 10.8 g/dL — ABNORMAL LOW (ref 13.0–17.0)
Lymphocytes Relative: 20 %
Lymphs Abs: 1.7 10*3/uL (ref 0.7–4.0)
MCH: 26.3 pg (ref 26.0–34.0)
MCHC: 31.1 g/dL (ref 30.0–36.0)
MCV: 84.4 fL (ref 78.0–100.0)
MONO ABS: 0.8 10*3/uL (ref 0.1–1.0)
MONOS PCT: 9 %
Neutro Abs: 5.4 10*3/uL (ref 1.7–7.7)
Neutrophils Relative %: 60 %
Platelets: 301 10*3/uL (ref 150–400)
RBC: 4.11 MIL/uL — ABNORMAL LOW (ref 4.22–5.81)
RDW: 18.7 % — AB (ref 11.5–15.5)
WBC: 8.8 10*3/uL (ref 4.0–10.5)

## 2015-09-28 LAB — BASIC METABOLIC PANEL
Anion gap: 9 (ref 5–15)
BUN: 28 mg/dL — AB (ref 4–21)
BUN: 28 mg/dL — AB (ref 6–20)
CALCIUM: 9.1 mg/dL (ref 8.9–10.3)
CO2: 25 mmol/L (ref 22–32)
CREATININE: 1 mg/dL (ref 0.6–1.3)
CREATININE: 1.02 mg/dL (ref 0.61–1.24)
Chloride: 106 mmol/L (ref 101–111)
GFR calc non Af Amer: >60 mL/min (ref >60)
GLUCOSE: 107 mg/dL
GLUCOSE: 107 mg/dL — AB (ref 65–99)
POTASSIUM: 4.5 mmol/L (ref 3.4–5.3)
Potassium: 4.5 mmol/L (ref 3.5–5.1)
SODIUM: 140 mmol/L (ref 137–147)
Sodium: 140 mmol/L (ref 135–145)

## 2015-09-28 LAB — CBC AND DIFFERENTIAL
HCT: 35 % — AB (ref 41–53)
Hemoglobin: 10.8 g/dL — AB (ref 13.5–17.5)
Platelets: 301 10*3/uL (ref 150–399)
WBC: 8.8 10^3/mL

## 2015-09-29 ENCOUNTER — Encounter: Payer: Self-pay | Admitting: Internal Medicine

## 2015-09-29 NOTE — Progress Notes (Signed)
Patient ID: [name], male   DOB: 1932-02-24, 82 y.o.   MRN: [id number]     Location:  [facility]   Place of Service:  SNF (31)  [name], Bertram Millard, MD  Patient Care Team: Frederica Kuster, MD as PCP - General (Family Medicine) [name], MD as Attending Physician (Urology)  Extended Emergency Contact Information Primary Emergency Contact: [name],[name] Address: [address]          [place], [place] [address] [name] of [place] Home Phone: [phone] Mobile Phone: [phone] Relation: Daughter Secondary Emergency Contact: Zaffino,[name] Address: [address]          [place], [place] [address] [name] of [place] Home Phone: [phone] Mobile Phone: [phone] Relation: Son   Goals of care: Advanced Directive information Advanced Directives 09/10/2015  Does patient have an advance directive? Yes  Type of Advance Directive Out of facility DNR (pink MOST or yellow form)  Does patient want to make changes to advanced directive? No - Patient declined  Copy of advanced directive(s) in chart? Yes  Would patient like information on creating an advanced directive? -     Chief Complaint  Patient presents with  . Acute Visit  Secondary to cough-chest x-ray showed mild vascular congestion     HPI:  Pt is a 82 y.o. male seen today for an acute visit for length of a cough.  He does have a history of COPD and is nebulizers every 6 hours as well as when necessary nebulizers.  He was recently seen by Dr. Alwyn Ren thought to have symptoms of acute bronchitis and has finished a course of Augmentin.  He was still complaining of a cough however and a chest x-ray was ordered which did show mild vascular congestion.  Per chart review he did have a cardiac echo done in June 2016 which showed an ejection fraction of 55-60% with grade 1 diastolic dysfunction.  He is not on a diuretic.  He is not really complaining of any increased shortness of breath.  He is  complaining of some on a nasal discharge appears he may have some allergy symptoms here as well.  Vital signs appear to be stable he is afebrile.  I have reviewed his weights and actually appears she's lost some weight it has have fairly massive lymphedema weight on April 19 was 308 if scales are accurate it is now 298 as of April 26-again there is some scale variability here.  He did have a BMP done approximately 2 years ago which was not very remarkable at 245.5   i    He does have a history of atrial fibrillation this appears rate controlled on metoprolol and Cardizem continues on Coumadin for anticoagulation I INR was 2.91 on April 26 he is on 5 mg a day except for 7.5 mg 1 daily week.  .     Past Medical History  Diagnosis Date  . Venous insufficiency   . AAA (abdominal aortic aneurysm) (HCC)   . Ulcer   . DVT (deep venous thrombosis) (HCC) 05/04/2013    "LLE"  . Cellulitis   . Nicotine dependence   . Bronchospasm   . Elevated PSA   . BPH (benign prostatic hyperplasia)   . COPD (chronic obstructive pulmonary disease) (HCC)   . Cognitive impairment   . Daily headache   . Arthritis     "probably on the right leg/hip" (09/20/2013)  . Dementia     "don't know exactly what kind" (09/20/2013)  .  Kidney stones   . Glaucoma, both eyes   . Atrial fibrillation (HCC)   . Fall at home September 18, 2013  . Benign localized hyperplasia of prostate with urinary retention 12/23/2014  . AAA (abdominal aortic aneurysm) without rupture (HCC) 10/06/2012  . Anticoagulation goal of INR 2 to 3 06/01/2013  . B12 deficiency 09/20/2013  . Bladder calculus 12/23/2014    2.6cm and present since at least 2011 when it was about 1.6cm.   . Cardiomyopathy (HCC) 10/15/2014  . CHF (congestive heart failure) (HCC) 09/26/2013  . Chronic venous insufficiency 10/06/2012  . Edema-Bilat Leg and foot 03/07/2013  . Multifactorial dementia 10/06/2012  . Obesity, unspecified 10/06/2012  . Peripheral vascular disease (HCC)  09/20/2012  . Pyelonephritis, acute 12/22/2014  . Weakness 09/18/2013  . Wound cellulitis     let ankle wound size of half dollar per nurse at [facility] on 03/25/2015   Past Surgical History  Procedure Laterality Date  . Cataract extraction w/ intraocular lens  implant, bilateral Bilateral   . Insitu percutaneous pinning femur Right 1986  . Hip fracture surgery Right 2009  . Back surgery    . Posterior laminectomy / decompression lumbar spine  2004    Decompressive laminectomy unilaterally on the right at L4-5 with  . Femur fracture surgery Right 04/1985  . Cystoscopy w/ stone manipulation  1970's    "once"  . Spine surgery    . Eye surgery    . Cholecystectomy  2011    Gall Bladder  . Cystoscopy with litholapaxy N/A 03/28/2015    Procedure: CYSTOSCOPY WITH LITHOLAPAXY;  Surgeon: Malen Gauze, MD;  Location: [facility];  Service: Urology;  Laterality: N/A;  . Transurethral resection of prostate N/A 03/28/2015    Procedure: TRANSURETHRAL RESECTION OF THE PROSTATE ;  Surgeon: Malen Gauze, MD;  Location: [facility];  Service: Urology;  Laterality: N/A;    Allergies  Allergen Reactions  . Oxycodone Hcl Other (See Comments)     makes pt "wild"  . Propoxyphene N-Acetaminophen Other (See Comments)     Makes pt. "wild"   Medications have been reviewed and include Zoloft 75 mg a day.  Lopressor 12.5   mg twice a day.  Coumadin 5 mg dailyExcept for 7.5 mg on Wednesdays.  Cardizem 120 mg daily.  Cardura 8 mg a day.  Hiprex 1 gm twice a day.  Vicodin 5-3 25 mg every 4 hours when necessary.  Duo nebs 3 times a day and 4 times a day when necessary.     Review of Systems   In general no complaints of fever chills appears weight is trending down although there is some scale variability.  Skin does not complain of rashes or itching does have a history of left lower extremity cellulitis at times recently completed doxycycline this appears to be improved.  Ears  eyes nose mouth and throat does appear to have some clear drainage from his eyes nose question allergy symptoms.  [name] Respiratory does not complain shortness of breath but does have a cough that he is concerned about  Cardiac does not complain of chest pain.  Has chronic lymphedema.  GI does not complain of nausea vomiting diarrhea or constipation or abdominal discomfort.  GU is not complaining of dysuria does have a history of obstructive uropathy followed by urology.  Musculoskeletal ambulates largely in a wheelchair is able to stand with assistance I believe in restorative-does not complain of joint pain at this time does have  lower extremity weakness lymphedema.  Neurologic is not complaining of dizziness or headache.  Psych this appears relatively stable he does have a history of depression but appears relatively at baseline does have some cognitive deficits but largely pleasant and appropriate.    Immunization History  Administered Date(s) Administered  . Influenza,inj,Quad PF,36+ Mos 03/20/2013, 03/23/2014, 02/11/2015  . Pneumococcal Polysaccharide-23 03/02/2007, 02/11/2015  . Td 12/30/1996   Pertinent  Health Maintenance Due  Topic Date Due  . INFLUENZA VACCINE  12/31/2015  . PNA vac Low Risk Adult (2 of 2 - PCV13) 02/11/2016   Fall Risk  01/04/2015 04/18/2014 08/15/2013  Falls in the past year? Yes Yes Yes  Number falls in past yr: 2 or more 1 2 or more  Injury with Fall? No Yes -  Risk Factor Category  - High Fall Risk High Fall Risk  Risk for fall due to : Impaired balance/gait;Impaired mobility Impaired mobility;Mental status change History of fall(s);Impaired balance/gait;Impaired mobility;Mental status change   Functional Status Survey:    Filed Vitals:   09/29/15 2007  BP: 112/54  Pulse: 68  Temp: 98.2 F (36.8 C)  Resp: 20    Physical Exam   Temperature 98.2 pulse 68 respirations 20 blood pressure 112/54 weight is 298.3  In general this is a elderly  male in no distress sitting comfortably in his wheelchair.  His skin is warm and dry he does have venous stasis changes to his legs bilaterally  Currently lower legs are wrapped  Eyes pupils appear reactive to light there appears to be some clear drainage visual acuity appears intact sclera and conjunctivae are relatively clear.  Oropharynx clear mucous membranes moist again appears to have possibly some clear drainage.  Chest -- clear to auscultation there are reduced breath sounds there is no labored breathing.  Heart is regular rate and rhythm with occasional irregular beats  without murmur gallop or rub he has baseline venous stasis changes lymphedema legs are currently wrapped bilaterally I will say one plus edema lower x-rays bilaterally.  He does not really have any significant sacral edema  Abdomen somewhat obese soft nontender with positive bowel sounds.  GU he does have an indwelling Foley catheter does not have overt suprapubic tenderness  Muscle skeletal ambulating largely in a wheelchair moves extremities at baseline has extensive lower extremity weakness with significant lymphedema.  Neurologic is grossly intact speech is clear.  Psych he is oriented to self pleasant and appropriate has confusion at times which is baseline.    Labs reviewed:  Dossie Arbour.  INR 2.91.  On April 20 and all was 2.11.  09/11/2015.  B12 440.  WBC 7.0 hemoglobin 10.7 platelets 227.  Sodium 143 potassium 4.2 BUN 28 creatinine 1.05.  BNP-245.5  Recent Labs  07/29/15 0630 09/11/15 0720 09/28/15 1200  NA 144 143 140  K 4.1 4.2 4.5  CL 109 110 106  CO2 28 28 25   GLUCOSE 107* 103* 107*  BUN 24* 28* 28*  CREATININE 1.04 1.05 1.02  CALCIUM 8.6* 8.6* 9.1    Recent Labs  02/10/15 1120 02/11/15 0627 04/17/15 0800  AST 23 20 15   ALT 18 18 10*  ALKPHOS 74 64 77  BILITOT 1.3* 0.6 0.6  PROT 6.7 6.0* 6.6  ALBUMIN 2.9* 2.7* 3.0*    Recent Labs  07/29/15 0630  09/11/15 0720 09/28/15 1200  WBC 9.9 7.0 8.8  NEUTROABS 5.7 2.9 5.4  HGB 10.2* 10.7* 10.8*  HCT 33.3* 33.9* 34.7*  MCV 84.7 85.2  84.4  PLT 223 227 301   Lab Results  Component Value Date   TSH 1.960 09/22/2013   Lab Results  Component Value Date   HGBA1C 5.8* 09/19/2013   Lab Results  Component Value Date   CHOL 157 10/06/2012   HDL 34* 10/06/2012   LDLCALC 96 10/06/2012   TRIG 135 10/06/2012   CHOLHDL 4.6 10/06/2012    Significant Diagnostic Results in last 30 days:  Dg Chest 1 View  09/08/2015  CLINICAL DATA:  Cough and congestion. EXAM: CHEST 1 VIEW COMPARISON:  02/10/2015 FINDINGS: Cardiac silhouette is normal in size. No mediastinal or hilar masses or convincing adenopathy. Mild reticular opacity noted at the lung bases consistent with scarring and/ or atelectasis. Mild scarring noted in the upper lobes. No evidence of pneumonia or edema. No pleural effusion or pneumothorax. Bony thorax is grossly intact. IMPRESSION: No acute cardiopulmonary disease. Electronically Signed   By: Amie Portlandavid  Ormond M.D.   On: 09/08/2015 14:55    Assessment/Plan  #1-history of COPD with cough--appears to be somewhat challenging situation he is on  routine nebulizers as well as when necessary-will add Mucinex 60 mg twice a day for 7 days-he has been on this before but would like to do a longer course.  Also will start Claritin 10 mg a day for 7 days-for possible allergy drainage contributing to this.  It is not out of  the question CHF may be contributing to this and again we will be addressing this as noted below    #2-history of atrial fibrillation this appears rate controlled on metoprolol as well as Cardizem INR is therapeutic-- update INR is pending.  #3 history CHF Update x-ray shows mild vascular congestion-previous x-ray on 09/08/2015 did not appear to show pulmonary edema this was discussed with Dr. Alwyn RenHopper via phone and will give a dose of Lasix at 40 mg with 20 mgof  potassium-. Clinically he appears to be stable actually appears to have lost weight edema does not appear to be increased from baseline although this will have to be watched.  We will order a BNP tomorrow.  Also with history apparently of some renal insufficiency Will update a BMP as well to assess his renal function and electrolyte status.  .  #4 history UTIpositive for MRSA he is completing course of Macrobid at this point appears to be asymptomatic-he has seen urology in the past and is thought there is some element of colonization here as well  [id number]-of note greater than 35 minutes spent assessing patient-reviewing his chart-his labs-previous radiology studies-and previous echo results-discussing his status with nursing staff as well as with Dr. Alwyn RenHopper via phone-and coordinating and formulating a plan of care-of note greater than 50% of time spent coordinating plan of care with investigations as noted above

## 2015-10-01 DIAGNOSIS — Z961 Presence of intraocular lens: Secondary | ICD-10-CM | POA: Diagnosis not present

## 2015-10-01 DIAGNOSIS — H401134 Primary open-angle glaucoma, bilateral, indeterminate stage: Secondary | ICD-10-CM | POA: Diagnosis not present

## 2015-10-01 DIAGNOSIS — Z7901 Long term (current) use of anticoagulants: Secondary | ICD-10-CM | POA: Diagnosis not present

## 2015-10-02 ENCOUNTER — Non-Acute Institutional Stay (SKILLED_NURSING_FACILITY): Payer: Medicare Other | Admitting: Internal Medicine

## 2015-10-02 DIAGNOSIS — R05 Cough: Secondary | ICD-10-CM | POA: Diagnosis not present

## 2015-10-02 DIAGNOSIS — J42 Unspecified chronic bronchitis: Secondary | ICD-10-CM | POA: Diagnosis not present

## 2015-10-02 DIAGNOSIS — I4891 Unspecified atrial fibrillation: Secondary | ICD-10-CM | POA: Diagnosis not present

## 2015-10-02 DIAGNOSIS — L309 Dermatitis, unspecified: Secondary | ICD-10-CM | POA: Diagnosis not present

## 2015-10-02 DIAGNOSIS — R059 Cough, unspecified: Secondary | ICD-10-CM

## 2015-10-02 NOTE — Progress Notes (Signed)
Patient ID: CLAVIN RUHLMAN, male   DOB: November 10, 1931, 82 y.o.   MRN: [id number]         Location:  [facility] Room Number: 121 Place of Service:  SNF (31)  [name], Bertram Millard, MD  Patient Care Team: Frederica Kuster, MD as PCP - General (Family Medicine) [name], MD as Attending Physician (Urology)  Extended Emergency Contact Information Primary Emergency Contact: [name],[name] Address: [address]          [place], [place] [address] [name] of [place] Home Phone: [phone] Mobile Phone: [phone] Relation: Daughter Secondary Emergency Contact: Posada,[name] Address: [address]          [place], [place] [address] [name] of [place] Home Phone: [phone] Mobile Phone: [phone] Relation: Son   Goals of care: Advanced Directive information Advanced Directives 10/02/2015  Does patient have an advance directive? Yes  Type of Advance Directive Out of facility DNR (pink MOST or yellow form)  Does patient want to make changes to advanced directive? No - Patient declined  Copy of advanced directive(s) in chart? Yes  Pre-existing out of facility DNR order (yellow form or pink MOST form) Yellow form placed in chart (order not valid for inpatient use)     Chief Complaint  Patient presents with  . Acute Visit    Complains of Redness on both forearms warm to the touch  Follow-up cough     HPI:  Pt is a 82 y.o. male seen today for nursing note saying that patient has erythematous arms bilaterally.  However today erythema appears essentially resolved-per discussion with nursing apparently he had been out in the sun yesterday and I suspect some of this may have been just sun exposure erythema-I do not see evidence of a sunburn here or evidence of cellulitis  He does have a history of COPD and is nebulizers every 6 hours as well as when necessary nebulizers. We did order a course of Claritin as well as Mucinex secondary to some increased cough  and congestion.  He also had some eye drainage.  This appears to be improved as well he is finishing the Claritin and Mucinex does have again the routine nebulizers.  Drainage appears to be improved   He also received a dose of Lasix secondary to x-ray that showed mild vascular congestion-.  He does have a history appears per echo of grade 1 diastolic CHF-this appears to have stabilized as well again the cough is improved he does not complain of any shortness of breath edema appears to be relatively baseline he does have significant lymphedema which remains quite significant      i     .     Past Medical History  Diagnosis Date  . Venous insufficiency   . AAA (abdominal aortic aneurysm) (HCC)   . Ulcer   . DVT (deep venous thrombosis) (HCC) 05/04/2013    "LLE"  . Cellulitis   . Nicotine dependence   . Bronchospasm   . Elevated PSA   . BPH (benign prostatic hyperplasia)   . COPD (chronic obstructive pulmonary disease) (HCC)   . Cognitive impairment   . Daily headache   . Arthritis     "probably on the right leg/hip" (09/20/2013)  . Dementia     "don't know exactly what kind" (09/20/2013)  . Kidney stones   . Glaucoma, both eyes   . Atrial fibrillation (HCC)   . Fall at home September 18, 2013  . Benign localized  hyperplasia of prostate with urinary retention 12/23/2014  . AAA (abdominal aortic aneurysm) without rupture (HCC) 10/06/2012  . Anticoagulation goal of INR 2 to 3 06/01/2013  . B12 deficiency 09/20/2013  . Bladder calculus 12/23/2014    2.6cm and present since at least 2011 when it was about 1.6cm.   . Cardiomyopathy (HCC) 10/15/2014  . CHF (congestive heart failure) (HCC) 09/26/2013  . Chronic venous insufficiency 10/06/2012  . Edema-Bilat Leg and foot 03/07/2013  . Multifactorial dementia 10/06/2012  . Obesity, unspecified 10/06/2012  . Peripheral vascular disease (HCC) 09/20/2012  . Pyelonephritis, acute 12/22/2014  . Weakness 09/18/2013  . Wound cellulitis     let  ankle wound size of half dollar per nurse at [facility] on 03/25/2015   Past Surgical History  Procedure Laterality Date  . Cataract extraction w/ intraocular lens  implant, bilateral Bilateral   . Insitu percutaneous pinning femur Right 1986  . Hip fracture surgery Right 2009  . Back surgery    . Posterior laminectomy / decompression lumbar spine  2004    Decompressive laminectomy unilaterally on the right at L4-5 with  . Femur fracture surgery Right 04/1985  . Cystoscopy w/ stone manipulation  1970's    "once"  . Spine surgery    . Eye surgery    . Cholecystectomy  2011    Gall Bladder  . Cystoscopy with litholapaxy N/A 03/28/2015    Procedure: CYSTOSCOPY WITH LITHOLAPAXY;  Surgeon: Malen Gauze, MD;  Location: [facility];  Service: Urology;  Laterality: N/A;  . Transurethral resection of prostate N/A 03/28/2015    Procedure: TRANSURETHRAL RESECTION OF THE PROSTATE ;  Surgeon: Malen Gauze, MD;  Location: [facility];  Service: Urology;  Laterality: N/A;    Allergies  Allergen Reactions  . Oxycodone Hcl Other (See Comments)     makes pt "wild"  . Propoxyphene N-Acetaminophen Other (See Comments)     Makes pt. "wild"   Medications have been reviewed and include Zoloft 75 mg a day.  Lopressor 12.5   mg twice a day.  Coumadin 5 mg dailyExcept for 7.5 mg on Wednesdays.  Cardizem 120 mg daily.  Cardura 8 mg a day.  Hiprex 1 gm twice a day.  Vicodin 5-3 25 mg every 4 hours when necessary.  Duo nebs 3 times a day and 4 times a day when necessary.     Review of Systems   In general no complaints of fever chills .  Skin does not complain of rashes or itching Erythema on arms appears resolved  Ears eyes nose mouth and throat --I do not see any active drainage at this point  . Respiratory does not complain shortness of breath cough has improved  Cardiac does not complain of chest pain.  Has chronic lymphedema.  GI does not complain of nausea vomiting  diarrhea or constipation or abdominal discomfort.  GU is not complaining of dysuria does have a history of obstructive uropathy followed by urology.  Musculoskeletal ambulates largely in a wheelchair is able to stand with assistance I believe in restorative-does not complain of joint pain at this time does have lower extremity weakness lymphedema.  Neurologic is not complaining of dizziness or headache.  Psych this appears relatively stable he does have a history of depression but appears relatively at baseline does have some cognitive deficits but largely pleasant and appropriate.    Immunization History  Administered Date(s) Administered  . Influenza,inj,Quad PF,36+ Mos 03/20/2013, 03/23/2014, 02/11/2015  . Pneumococcal Polysaccharide-23  03/02/2007, 02/11/2015  . Td 12/30/1996   Pertinent  Health Maintenance Due  Topic Date Due  . INFLUENZA VACCINE  12/31/2015  . PNA vac Low Risk Adult (2 of 2 - PCV13) 02/11/2016   Fall Risk  01/04/2015 04/18/2014 08/15/2013  Falls in the past year? Yes Yes Yes  Number falls in past yr: 2 or more 1 2 or more  Injury with Fall? No Yes -  Risk Factor Category  - High Fall Risk High Fall Risk  Risk for fall due to : Impaired balance/gait;Impaired mobility Impaired mobility;Mental status change History of fall(s);Impaired balance/gait;Impaired mobility;Mental status change   Functional Status Survey:    Filed Vitals:   10/02/15 0935  BP: 134/70  Pulse: 65  Temp: 98.2 F (36.8 C)  TempSrc: Oral  Resp: 20  Height: 6\' 4"  (1.93 m)  Weight: 302 lb (136.986 kg)  SpO2: 96%    Physical Exam    In general this is a elderly male in no distress sitting comfortably in his wheelchair.  His skin is warm and dry he does have venous stasis changes to his legs bilaterally  Currently lower legs are wrapped  I do not see any increased erythema or warmth of his arms that is increased from baseline  Eyes pupils appear reactive to light --do not see any  drainage --visual acuity appears intact sclera and conjunctivae are relatively clear.  Oropharynx clear mucous membranes moist .  Chest -- clear to auscultation there are reduced breath sounds there is no labored breathing.  Heart is regular rate and rhythm with occasional irregular beats  without murmur gallop or rub he has baseline venous stasis changes lymphedema legs are currently wrapped bilaterally I will say one plus edema lower legs bilaterally.  He does not really have any significant sacral edema  Abdomen somewhat obese soft nontender with positive bowel sounds.  GU he does have an indwelling Foley catheter   Muscle skeletal ambulating largely in a wheelchair moves extremities at baseline has extensive lower extremity weakness with significant lymphedema.  Neurologic is grossly intact speech is clear.  Psych he is oriented to self pleasant and appropriate has confusion at times which is baseline.    Labs reviewed:  09/28/2015.  Sodium 140 potassium 4.5 BUN 28 creatinine 1.02.  WBC 8.8 hemoglobin 10.8 platelets 301  September 05 2015.  INR 2.91.  On April 20 and all was 2.11.  09/11/2015.  B12 440.  WBC 7.0 hemoglobin 10.7 platelets 227.  Sodium 143 potassium 4.2 BUN 28 creatinine 1.05.  BNP-245.5  Recent Labs  07/29/15 0630 09/11/15 0720 09/28/15 09/28/15 1200  NA 144 143 140 140  K 4.1 4.2 4.5 4.5  CL 109 110  --  106  CO2 28 28  --  25  GLUCOSE 107* 103*  --  107*  BUN 24* 28* 28* 28*  CREATININE 1.04 1.05 1.0 1.02  CALCIUM 8.6* 8.6*  --  9.1    Recent Labs  02/10/15 1120 02/11/15 0627 04/17/15 0800  AST 23 20 15   ALT 18 18 10*  ALKPHOS 74 64 77  BILITOT 1.3* 0.6 0.6  PROT 6.7 6.0* 6.6  ALBUMIN 2.9* 2.7* 3.0*    Recent Labs  07/29/15 0630 09/11/15 0720 09/28/15 09/28/15 1200  WBC 9.9 7.0 8.8 8.8  NEUTROABS 5.7 2.9  --  5.4  HGB 10.2* 10.7* 10.8* 10.8*  HCT 33.3* 33.9* 35* 34.7*  MCV 84.7 85.2  --  84.4  PLT 223 227 301 301  Lab Results  Component Value Date   TSH 1.960 09/22/2013   Lab Results  Component Value Date   HGBA1C 5.8* 09/19/2013   Lab Results  Component Value Date   CHOL 157 10/06/2012   HDL 34* 10/06/2012   LDLCALC 96 10/06/2012   TRIG 135 10/06/2012   CHOLHDL 4.6 10/06/2012    Significant Diagnostic Results in last 30 days:  Dg Chest 1 View  09/08/2015  CLINICAL DATA:  Cough and congestion. EXAM: CHEST 1 VIEW COMPARISON:  02/10/2015 FINDINGS: Cardiac silhouette is normal in size. No mediastinal or hilar masses or convincing adenopathy. Mild reticular opacity noted at the lung bases consistent with scarring and/ or atelectasis. Mild scarring noted in the upper lobes. No evidence of pneumonia or edema. No pleural effusion or pneumothorax. Bony thorax is grossly intact. IMPRESSION: No acute cardiopulmonary disease. Electronically Signed   By: Amie Portlandavid  Ormond M.D.   On: 09/08/2015 14:55    Assessment/Plan  #1-history of COPD with cough--he continues on routine nebulizers as well as when necessary he is finishing a course of Claritin and Mucinex this appears to be significantly improved I do not see any further drainage or evidence of chest congestion cough has improved at this point will monitor.  #2 history of arm erythema-again as noted above I do not see evidence of that today I suspect he had exposure to sun yesterday which prompted the finding last night-continue to monitor I do not see signs of infection at this time.  #3-anemia-hemoglobin remained stable at 10.8.  #4-history of atrial fibrillation this continues to be rate controlled on Lopressor he is on Coumadin for anticoagulation most recent INR 2.91 update INR is scheduled it appears for tomorrow-there is no evidence bruising or bleeding his hemoglobin is stable currently is on 5 mg of Coumadin everyday except some 0.5 mg on Wednesdays.  No evidence of increased bruising or bleeding  [id number]

## 2015-10-03 ENCOUNTER — Encounter (HOSPITAL_COMMUNITY)
Admission: RE | Admit: 2015-10-03 | Discharge: 2015-10-03 | Disposition: A | Discharge status: Home / Self Care | Payer: Medicare Other | Source: Skilled Nursing Facility | Attending: Internal Medicine | Admitting: Internal Medicine

## 2015-10-03 DIAGNOSIS — Z029 Encounter for administrative examinations, unspecified: Secondary | ICD-10-CM | POA: Insufficient documentation

## 2015-10-03 LAB — PROTIME-INR
INR: 2.46 — AB (ref 0.00–1.49)
PROTHROMBIN TIME: 26.4 s — AB (ref 11.6–15.2)

## 2015-10-06 NOTE — Progress Notes (Signed)
Patient ID: [name], male   DOB: 1931/06/30, 82 y.o.   MRN: [id number]

## 2015-10-17 ENCOUNTER — Encounter (HOSPITAL_COMMUNITY)
Admission: RE | Admit: 2015-10-17 | Discharge: 2015-10-17 | Disposition: A | Discharge status: Home / Self Care | Payer: Medicare Other | Source: Skilled Nursing Facility | Attending: *Deleted | Admitting: *Deleted

## 2015-10-17 DIAGNOSIS — Z029 Encounter for administrative examinations, unspecified: Secondary | ICD-10-CM | POA: Diagnosis not present

## 2015-10-17 LAB — PROTIME-INR
INR: 2.46 — AB (ref 0.00–1.49)
PROTHROMBIN TIME: 26.3 s — AB (ref 11.6–15.2)

## 2015-10-23 ENCOUNTER — Non-Acute Institutional Stay (SKILLED_NURSING_FACILITY): Payer: Medicare Other | Admitting: Internal Medicine

## 2015-10-23 ENCOUNTER — Encounter: Payer: Self-pay | Admitting: Internal Medicine

## 2015-10-23 ENCOUNTER — Encounter (HOSPITAL_COMMUNITY)
Admission: RE | Admit: 2015-10-23 | Discharge: 2015-10-23 | Disposition: A | Discharge status: Home / Self Care | Payer: Medicare Other | Source: Skilled Nursing Facility | Attending: *Deleted | Admitting: *Deleted

## 2015-10-23 DIAGNOSIS — I4891 Unspecified atrial fibrillation: Secondary | ICD-10-CM | POA: Diagnosis not present

## 2015-10-23 DIAGNOSIS — N400 Enlarged prostate without lower urinary tract symptoms: Secondary | ICD-10-CM | POA: Diagnosis not present

## 2015-10-23 DIAGNOSIS — F03918 Unspecified dementia, unspecified severity, with other behavioral disturbance: Secondary | ICD-10-CM

## 2015-10-23 DIAGNOSIS — I420 Dilated cardiomyopathy: Secondary | ICD-10-CM | POA: Diagnosis not present

## 2015-10-23 DIAGNOSIS — N390 Urinary tract infection, site not specified: Secondary | ICD-10-CM

## 2015-10-23 DIAGNOSIS — F0391 Unspecified dementia with behavioral disturbance: Secondary | ICD-10-CM | POA: Diagnosis not present

## 2015-10-23 DIAGNOSIS — J42 Unspecified chronic bronchitis: Secondary | ICD-10-CM

## 2015-10-23 DIAGNOSIS — Z029 Encounter for administrative examinations, unspecified: Secondary | ICD-10-CM | POA: Diagnosis not present

## 2015-10-23 LAB — URINALYSIS, ROUTINE W REFLEX MICROSCOPIC
Bilirubin Urine: NEGATIVE
GLUCOSE, UA: NEGATIVE mg/dL
KETONES UR: NEGATIVE mg/dL
Nitrite: NEGATIVE
PH: 5.5 (ref 5.0–8.0)
PROTEIN: 30 mg/dL — AB
Specific Gravity, Urine: 1.025 (ref 1.005–1.030)

## 2015-10-23 LAB — URINE MICROSCOPIC-ADD ON

## 2015-10-23 NOTE — Progress Notes (Signed)
Location:  [facility] Room Number: 121/W Place of Service:  SNF [phone]) Provider:  Gloriajean Dell, MD  Patient Care Team: Frederica Kuster, MD as PCP - General (Family Medicine) [name], MD as Attending Physician (Urology)  Extended Emergency Contact Information Primary Emergency Contact: [name],[name] Address: [address]          [place], [place] [address] [name] of [place] Home Phone: [phone] Mobile Phone: [phone] Relation: Daughter Secondary Emergency Contact: Habermann,[name] Address: [address]          [place], [place] [address] [name] of [place] Home Phone: [phone] Mobile Phone: [phone] Relation: Son  Code Status:  DNR Goals of care: Advanced Directive information Advanced Directives 10/23/2015  Does patient have an advance directive? Yes  Type of Advance Directive Out of facility DNR (pink MOST or yellow form)  Does patient want to make changes to advanced directive? No - Patient declined  Copy of advanced directive(s) in chart? Yes  Pre-existing out of facility DNR order (yellow form or pink MOST form) -     Chief Complaint  Patient presents with  . Acute Visit    For Anticoagulation   With history of atrial fibrillation-medical management of chronic medical issues including COPD-dilated cardiomyopathy dementia-BPH history of UTIs HPI:  Pt is a 82 y.o. male seen today for an acute visit for anticoagulation management with a history of atrial fibrillation INR today is 2.46  per lab done on May 18 has been stable for approximately the past month he is on Coumadin 7.5 mg on Wednesdays he is on 5 mg all other days.  He is on Lopressor as well as Cardizem for rate control and this appears to be stable as well as with pulses recently in the 60-the 80s range.  Regards the other issues patient has had some increased agitation and behaviors the last several days psychiatric nurse practitioner who follows him  has been contacted and has ordered Risperdal 0.25 mg by mouth this morning and 0.75 mg a daily at bedtime-she also reduces Zoloft dose from 100 mg a day down to 75 mg a day Patient apparently has had some aggressive behaviors towards male staff Miguel Aschoff is usually pleasant and cooperative when I examined him but was a bit agitated with me as well today.  He does have a history of dilated cardiomyopathy is not currently on a diuretic this appears to be stable again he is on a beta blocker most recent weight 304 appears to be relatively stable with what his weight was 1 month ago  He has significant lymphedema lower extremities currently his legs are wrapped this is followed closely by nursing and wound care.  He does have a history of COPD continues on routine nebulizers this is been stable at times he will develop acute bronchitis and require antibiotics and more aggressive treatment this is been stable now for several weeks.  Currently has no acute complaints although again he has had some increased agitation and behaviors as noted above     Past Medical History  Diagnosis Date  . Venous insufficiency   . AAA (abdominal aortic aneurysm) (HCC)   . Ulcer   . DVT (deep venous thrombosis) (HCC) 05/04/2013    "LLE"  . Cellulitis   . Nicotine dependence   . Bronchospasm   . Elevated PSA   . BPH (benign prostatic hyperplasia)   . COPD (chronic obstructive pulmonary disease) (HCC)   . Cognitive impairment   .  Daily headache   . Arthritis     "probably on the right leg/hip" (09/20/2013)  . Dementia     "don't know exactly what kind" (09/20/2013)  . Kidney stones   . Glaucoma, both eyes   . Atrial fibrillation (HCC)   . Fall at home September 18, 2013  . Benign localized hyperplasia of prostate with urinary retention 12/23/2014  . AAA (abdominal aortic aneurysm) without rupture (HCC) 10/06/2012  . Anticoagulation goal of INR 2 to 3 06/01/2013  . B12 deficiency 09/20/2013  . Bladder calculus  12/23/2014    2.6cm and present since at least 2011 when it was about 1.6cm.   . Cardiomyopathy (HCC) 10/15/2014  . CHF (congestive heart failure) (HCC) 09/26/2013  . Chronic venous insufficiency 10/06/2012  . Edema-Bilat Leg and foot 03/07/2013  . Multifactorial dementia 10/06/2012  . Obesity, unspecified 10/06/2012  . Peripheral vascular disease (HCC) 09/20/2012  . Pyelonephritis, acute 12/22/2014  . Weakness 09/18/2013  . Wound cellulitis     let ankle wound size of half dollar [facility] on 03/25/2015   Past Surgical History  Procedure Laterality Date  . Cataract extraction w/ intraocular lens  implant, bilateral Bilateral   . Insitu percutaneous pinning femur Right 1986  . Hip fracture surgery Right 2009  . Back surgery    . Posterior laminectomy / decompression lumbar spine  2004    Decompressive laminectomy unilaterally on the right at L4-5 with  . Femur fracture surgery Right 04/1985  . Cystoscopy w/ stone manipulation  1970's    "once"  . Spine surgery    . Eye surgery    . Cholecystectomy  2011    Gall Bladder  . Cystoscopy with litholapaxy N/A 03/28/2015    Procedure: CYSTOSCOPY WITH LITHOLAPAXY;  Surgeon: Malen GauzePatrick L McKenzie, MD;  Location: [facility];  Service: Urology;  Laterality: N/A;  . Transurethral resection of prostate N/A 03/28/2015    Procedure: TRANSURETHRAL RESECTION OF THE PROSTATE ;  Surgeon: Malen GauzePatrick L McKenzie, MD;  Location: [facility];  Service: Urology;  Laterality: N/A;    Allergies  Allergen Reactions  . Oxycodone Hcl Other (See Comments)     makes pt "wild"  . Propoxyphene N-Acetaminophen Other (See Comments)     Makes pt. "wild"    Current Outpatient Prescriptions on File Prior to Visit  Medication Sig Dispense Refill  . acetaminophen (TYLENOL) 500 MG tablet Take 1,000 mg by mouth every 6 (six) hours as needed for mild pain or fever.     . Amino Acids-Protein Hydrolys (FEEDING SUPPLEMENT, PRO-STAT SUGAR FREE 64,) LIQD Take 30 mLs by  mouth daily.    [name]. diltiazem (CARDIZEM CD) 120 MG 24 hr capsule Take 120 mg by mouth every morning.     [name]. doxazosin (CARDURA) 8 MG tablet Take 8 mg by mouth every morning.     [name]. HYDROcodone-acetaminophen (NORCO/VICODIN) 5-325 MG tablet Take one tablet by mouth every 4 hours as needed for moderate pain. Max APAP--3gm/24 hrs from all sources 180 tablet 0  . ipratropium-albuterol (DUONEB) 0.5-2.5 (3) MG/3ML SOLN 3ml via Neb inhalation every 6 hours for congestion.    [name]. latanoprost (XALATAN) 0.005 % ophthalmic solution Place 1 drop into both eyes at bedtime.     . methenamine (HIPREX) 1 g tablet Take 1 g by mouth 2 (two) times daily with a meal. Holding while on Macrobid    . metoprolol tartrate (LOPRESSOR) 25 MG tablet Take 0.5 tablets (12.5 mg total) by mouth 2 (  two) times daily. 30 tablet 0  . sertraline (ZOLOFT) 25 MG tablet Take three tablets by mouth once daily    . triamcinolone cream (KENALOG) 0.1 % Apply 1 application topically daily.    [name] trypsin-balsam-castor oil (XENADERM) ointment Apply ointment to buttocks and sacrum q shift & prn for prevention     No current facility-administered medications on file prior to visit.     Review of Systems   This is somewhat limited since patient is a poor historian.   Provided by nursing inpatient.  General no complaints of fever or chills.  Skin doesn't complain of rashes or itching does have at times history of lower extremity cellulitis.  Head ears eyes nose mouth and throat does not complain of nasal discharge or visual changes does have at times she symptoms.  Respiration not complain shortness breath or cough has a history COPD.  Cardiac does not complain of chest pain.  GI is not complaining of nausea vomiting diarrhea constipation or abdominal discomfort.  GU does not complain of dysuria has a history of obstructive uropathy is followed by urology-. Preop muscle skeletal not complaining of joint pain currently ambulates largely in  the wheelchair.  Neurologic is not complaining of dizziness headache or numbness.  Psych-appears to have somewhat increased behaviors as noted above  Immunization History  Administered Date(s) Administered  . Influenza,inj,Quad PF,36+ Mos 03/20/2013, 03/23/2014, 02/11/2015  . Pneumococcal Polysaccharide-23 03/02/2007, 02/11/2015  . Td 12/30/1996   Pertinent  Health Maintenance Due  Topic Date Due  . INFLUENZA VACCINE  12/31/2015  . PNA vac Low Risk Adult (2 of 2 - PCV13) 02/11/2016   Fall Risk  01/04/2015 04/18/2014 08/15/2013  Falls in the past year? Yes Yes Yes  Number falls in past yr: 2 or more 1 2 or more  Injury with Fall? No Yes -  Risk Factor Category  - High Fall Risk High Fall Risk  Risk for fall due to : Impaired balance/gait;Impaired mobility Impaired mobility;Mental status change History of fall(s);Impaired balance/gait;Impaired mobility;Mental status change   Functional Status Survey:    Filed Vitals:   10/23/15 1156  BP: 123/66  Pulse: 61  Temp: 97.9 F (36.6 C)  TempSrc: Oral  Resp: 18  Height: 6\' 4"  (1.93 m)  Weight: 301 lb (136.533 kg)  Of note update vitals signs temperature is 98.1 pulse 56 respirations 20 blood pressure 123/69 O2 saturation is in the 90s on room air-most recent weight done March 22 is 301 Body mass index is 36.65 kg/(m^2). Physical Exam   In general this elderly male in no distress sitting comfortably in his wheelchair.  His skin is warm and dry he does have wrapping of his legs bilaterally with again significant lymphedema.  Eyes pupils appear reactive light visual acuity appears grossly intact.  Oropharynx clear mucous membranes moist.  Chest is clear to auscultation with reduced air entry no labored breathing.  Heart is regular rate and rhythm without murmur gallop or rub has venous stasis changes lymphedema legs bilaterally that are currently wrapped.  Abdomen is obese soft nontender with positive bowel sounds.  GU continue  with indwelling Foley catheter could not really appreciate suprapubic tenderness although patient again somewhat agitated with exam which limited it.  Muscle skeletal continues to ambulate in a wheelchair strength appears to be intact upper extremities lower survey weakness persists but not changed from baseline.  Neurologic grossly intact to speech is clear cranial nerves intact.  Psyche is oriented to self somewhat agitated with  exam today he did largely allow but was somewhat impatient    Labs reviewed:  Recent Labs  07/29/15 0630 09/11/15 0720 09/28/15 09/28/15 1200  NA 144 143 140 140  K 4.1 4.2 4.5 4.5  CL 109 110  --  106  CO2 28 28  --  25  GLUCOSE 107* 103*  --  107*  BUN 24* 28* 28* 28*  CREATININE 1.04 1.05 1.0 1.02  CALCIUM 8.6* 8.6*  --  9.1    Recent Labs  02/10/15 1120 02/11/15 0627 04/17/15 0800  AST ALT 18 18 10*  ALKPHOS 74 64 77  BILITOT 1.3* 0.6 0.6  PROT 6.7 6.0* 6.6  ALBUMIN 2.9* 2.7* 3.0*    Recent Labs  07/29/15 0630 09/11/15 0720 09/28/15 09/28/15 1200  WBC 9.9 7.0 8.8 8.8  NEUTROABS 5.7 2.9  --  5.4  HGB 10.2* 10.7* 10.8* 10.8*  HCT 33.3* 33.9* 35* 34.7*  MCV 84.7 85.2  --  84.4  PLT 223 227 301 301   Lab Results  Component Value Date   TSH 1.960 09/22/2013   Lab Results  Component Value Date   HGBA1C 5.8* 09/19/2013   Lab Results  Component Value Date   CHOL 157 10/06/2012   HDL 34* 10/06/2012   LDLCALC 96 10/06/2012   TRIG 135 10/06/2012   CHOLHDL 4.6 10/06/2012    Significant Diagnostic Results in last 30 days:  No results found.  Assessment/Plan  #1-history of A. fib this appears rate controlled on Cardizem and Lopressor-he is on Coumadin as noted above for anticoagulation INR has been therapeutic now for some time will recheck this tomorrow INR most recently 2.46 on lab done on May 18.  #2 history of dementia with behaviors-she is followed closely by psychiatric nurse practitioner he does have some  increased behaviors especially toward male staff members-psychiatric nurse practitioner has addressed this decreased his Zoloft as well as gave him a dose of Risperdal this morning and increased his Risperdal at night up to 0.75 mg daily at bedtime-.  Also will update a urinalysis and culture he does have a history of UTIs-at times he will grew out MRSA but this is thought to be more of a colonization-and this has not been treated aggressively in the past again will await culture results.  #3 history of COPD this is been relatively stable he is on nebulizers routinely respiratory status appears stable at this point at times again he will develop bronchitis.  #4 history of hypertension this appears stable with recent blood pressures 123/66-133/63-120/65-.  . #5 history of chronic renal insufficiency this appears stable most recent creatinine 1.02 on 09/28/2015 will update this.  History anemia appears baseline with a hemoglobin of 10.8 on lab done as well on April 29.  #7 history of DVT in the past this is distant he is on chronic anticoagulation as noted with Coumadin this is for A. fib as well.  #8-history of abdominal aortic aneurysm-this has been followed by vascular CT scan in July 2016 showed an unchanged 4.4 cm in diameter AAA there is recommendation to follow this later this year with an ultrasound--will write an order to follow-up with vascular on this    #9 history of dilated cardiomyopathy.  Currently not on a diuretic he appears stable clinically at this point will monitor at one point x-ray did show mild vascular congestion and he did receive a short course of Lasix with improvement-his weights continue to be stable.  #10  history of BPH does have an indwelling Foley catheter is followed by urology Hiprex was started last March-orders to return to clinic in approximately one month from now.  Of note again will update a PT/INR CBC and metabolic panel tomorrow for updated values as  follows follow-up of his Coumadin-also a UA C&S secondary to increased confusion and agitation. Also will check a hemoglobin A1c since he is on Risperdal  [id number]-of note greater than 40 minutes spent assessing patient-reviewing his chart-discussing his status with nursing staff-reviewing his labs-and coordinating and formulating a plan of care for numerous diagnoses-of note greater than 50% of time spent coordinating plan of care        London Sheer, [place] [phone]

## 2015-10-24 ENCOUNTER — Encounter (HOSPITAL_COMMUNITY)
Admission: RE | Admit: 2015-10-24 | Discharge: 2015-10-24 | Disposition: A | Discharge status: Home / Self Care | Payer: Medicare Other | Source: Skilled Nursing Facility | Attending: *Deleted | Admitting: *Deleted

## 2015-10-24 DIAGNOSIS — Z029 Encounter for administrative examinations, unspecified: Secondary | ICD-10-CM | POA: Diagnosis not present

## 2015-10-24 LAB — COMPREHENSIVE METABOLIC PANEL
ALBUMIN: 3.1 g/dL — AB (ref 3.5–5.0)
ALK PHOS: 76 U/L (ref 38–126)
ALT: 14 U/L — ABNORMAL LOW (ref 17–63)
ANION GAP: 3 — AB (ref 5–15)
AST: 15 U/L (ref 15–41)
BILIRUBIN TOTAL: 0.3 mg/dL (ref 0.3–1.2)
BUN: 26 mg/dL — AB (ref 6–20)
CALCIUM: 8.7 mg/dL — AB (ref 8.9–10.3)
CO2: 28 mmol/L (ref 22–32)
Chloride: 112 mmol/L — ABNORMAL HIGH (ref 101–111)
Creatinine, Ser: 1.07 mg/dL (ref 0.61–1.24)
GFR calc Af Amer: >60 mL/min (ref >60)
GFR calc non Af Amer: >60 mL/min (ref >60)
GLUCOSE: 91 mg/dL (ref 65–99)
POTASSIUM: 4 mmol/L (ref 3.5–5.1)
SODIUM: 143 mmol/L (ref 135–145)
TOTAL PROTEIN: 6.6 g/dL (ref 6.5–8.1)

## 2015-10-24 LAB — PROTIME-INR
INR: 2.53 — ABNORMAL HIGH (ref 0.00–1.49)
Prothrombin Time: 26.9 seconds — ABNORMAL HIGH (ref 11.6–15.2)

## 2015-10-24 LAB — CBC WITH DIFFERENTIAL/PLATELET
BASOS ABS: 0.1 10*3/uL (ref 0.0–0.1)
Basophils Relative: 1 %
Eosinophils Absolute: 1.2 10*3/uL — ABNORMAL HIGH (ref 0.0–0.7)
Eosinophils Relative: 16 %
HEMATOCRIT: 35.2 % — AB (ref 39.0–52.0)
HEMOGLOBIN: 10.9 g/dL — AB (ref 13.0–17.0)
LYMPHS ABS: 1.5 10*3/uL (ref 0.7–4.0)
LYMPHS PCT: 20 %
MCH: 26.3 pg (ref 26.0–34.0)
MCHC: 31 g/dL (ref 30.0–36.0)
MCV: 84.8 fL (ref 78.0–100.0)
Monocytes Absolute: 0.9 10*3/uL (ref 0.1–1.0)
Monocytes Relative: 12 %
NEUTROS ABS: 3.9 10*3/uL (ref 1.7–7.7)
NEUTROS PCT: 51 %
PLATELETS: 237 10*3/uL (ref 150–400)
RBC: 4.15 MIL/uL — AB (ref 4.22–5.81)
RDW: 18.3 % — ABNORMAL HIGH (ref 11.5–15.5)
WBC: 7.6 10*3/uL (ref 4.0–10.5)

## 2015-10-25 ENCOUNTER — Non-Acute Institutional Stay (SKILLED_NURSING_FACILITY): Payer: Medicare Other | Admitting: Internal Medicine

## 2015-10-25 DIAGNOSIS — F0391 Unspecified dementia with behavioral disturbance: Secondary | ICD-10-CM

## 2015-10-25 DIAGNOSIS — R197 Diarrhea, unspecified: Secondary | ICD-10-CM

## 2015-10-25 DIAGNOSIS — B379 Candidiasis, unspecified: Secondary | ICD-10-CM | POA: Diagnosis not present

## 2015-10-25 LAB — URINE CULTURE: Culture: 80000 — AB

## 2015-10-25 LAB — HEMOGLOBIN A1C
Hgb A1c MFr Bld: 6.3 % — ABNORMAL HIGH (ref 4.8–5.6)
Mean Plasma Glucose: 134 mg/dL

## 2015-10-25 NOTE — Progress Notes (Signed)
Patient ID: [name], male   DOB: 24-Jul-1931, 82 y.o.   MRN: [id number]  Location:  [facility]   Place of Service:  SNF [phone]) Provider:  Gloriajean Dell, MD  Patient Care Team: Frederica Kuster, MD as PCP - General (Family Medicine) [name], MD as Attending Physician (Urology)  Extended Emergency Contact Information Primary Emergency Contact: [name],[name] Address: [address]          [place], [place] [address] [name] of [place] Home Phone: [phone] Mobile Phone: [phone] Relation: Daughter Secondary Emergency Contact: Mies,[name] Address: [address]          [place], [place] [address] [name] of [place] Home Phone: [phone] Mobile Phone: [phone] Relation: Son  Code Status:  DNR Goals of care: Advanced Directive information Advanced Directives 10/23/2015  Does patient have an advance directive? Yes  Type of Advance Directive Out of facility DNR (pink MOST or yellow form)  Does patient want to make changes to advanced directive? No - Patient declined  Copy of advanced directive(s) in chart? Yes  Pre-existing out of facility DNR order (yellow form or pink MOST form) -      Chief complaint-acute visit follow-up urine culture growing out 80,000 colonies of these-also follow-up question diarrhea  HPI:  Pt is a 82 y.o. male with a complex medical history-she was seen earlier this week for a routine visit-also had increased behaviors more aggression towards male staff members more agitation-psychiatric nurse practitioner has adjusted his medications including decreasing his Zoloft and increasing his Risperdal area  His behaviors apparently are improved today.  A urine culture also was obtained he does have a significant history of urinary issues and has a chronic indwelling Foley catheter with some degree of MRSA colonization.  Culture actually has grown out only 80,000 colonies obese.  Again clinically he is afebrile  does not complain of dysuria behaviors appear to have improved compared to earlier this week.  Nursing staff has noted some mucoid stool-he is not complaining of any abdominal discomfort talking with his nursing tech this afternoon she says this is not really He has this at times.   he is afebrile               Past Medical History  Diagnosis Date  . Venous insufficiency   . AAA (abdominal aortic aneurysm) (HCC)   . Ulcer   . DVT (deep venous thrombosis) (HCC) 05/04/2013    "LLE"  . Cellulitis   . Nicotine dependence   . Bronchospasm   . Elevated PSA   . BPH (benign prostatic hyperplasia)   . COPD (chronic obstructive pulmonary disease) (HCC)   . Cognitive impairment   . Daily headache   . Arthritis     "probably on the right leg/hip" (09/20/2013)  . Dementia     "don't know exactly what kind" (09/20/2013)  . Kidney stones   . Glaucoma, both eyes   . Atrial fibrillation (HCC)   . Fall at home September 18, 2013  . Benign localized hyperplasia of prostate with urinary retention 12/23/2014  . AAA (abdominal aortic aneurysm) without rupture (HCC) 10/06/2012  . Anticoagulation goal of INR 2 to 3 06/01/2013  . B12 deficiency 09/20/2013  . Bladder calculus 12/23/2014    2.6cm and present since at least 2011 when it was about 1.6cm.   . Cardiomyopathy (HCC) 10/15/2014  . CHF (congestive heart failure) (HCC) 09/26/2013  . Chronic venous insufficiency 10/06/2012  . Edema-Bilat Leg  and foot 03/07/2013  . Multifactorial dementia 10/06/2012  . Obesity, unspecified 10/06/2012  . Peripheral vascular disease (HCC) 09/20/2012  . Pyelonephritis, acute 12/22/2014  . Weakness 09/18/2013  . Wound cellulitis     let ankle wound size of half dollar per nurse at [facility] on 03/25/2015   Past Surgical History  Procedure Laterality Date  . Cataract extraction w/ intraocular lens  implant, bilateral Bilateral   . Insitu percutaneous pinning femur Right 1986  . Hip fracture surgery Right 2009    . Back surgery    . Posterior laminectomy / decompression lumbar spine  2004    Decompressive laminectomy unilaterally on the right at L4-5 with  . Femur fracture surgery Right 04/1985  . Cystoscopy w/ stone manipulation  1970's    "once"  . Spine surgery    . Eye surgery    . Cholecystectomy  2011    Gall Bladder  . Cystoscopy with litholapaxy N/A 03/28/2015    Procedure: CYSTOSCOPY WITH LITHOLAPAXY;  Surgeon: Malen Gauze, MD;  Location: [facility];  Service: Urology;  Laterality: N/A;  . Transurethral resection of prostate N/A 03/28/2015    Procedure: TRANSURETHRAL RESECTION OF THE PROSTATE ;  Surgeon: Malen Gauze, MD;  Location: [facility];  Service: Urology;  Laterality: N/A;    Allergies  Allergen Reactions  . Oxycodone Hcl Other (See Comments)     makes pt "wild"  . Propoxyphene N-Acetaminophen Other (See Comments)     Makes pt. "wild"    Current Outpatient Prescriptions on File Prior to Visit  Medication Sig Dispense Refill  . acetaminophen (TYLENOL) 500 MG tablet Take 1,000 mg by mouth every 6 (six) hours as needed for mild pain or fever.     . Amino Acids-Protein Hydrolys (FEEDING SUPPLEMENT, PRO-STAT SUGAR FREE 64,) LIQD Take 30 mLs by mouth daily.    [name] diltiazem (CARDIZEM CD) 120 MG 24 hr capsule Take 120 mg by mouth every morning.     [name] doxazosin (CARDURA) 8 MG tablet Take 8 mg by mouth every morning.     [name] HYDROcodone-acetaminophen (NORCO/VICODIN) 5-325 MG tablet Take one tablet by mouth every 4 hours as needed for moderate pain. Max APAP--3gm/24 hrs from all sources 180 tablet 0  . ipratropium-albuterol (DUONEB) 0.5-2.5 (3) MG/3ML SOLN 3ml via Neb inhalation every 6 hours for congestion.    [name] latanoprost (XALATAN) 0.005 % ophthalmic solution Place 1 drop into both eyes at bedtime.     . methenamine (HIPREX) 1 g tablet Take 1 g by mouth 2 (two) times daily with a meal. Holding while on Macrobid    . metoprolol tartrate (LOPRESSOR) 25 MG tablet Take 0.5 tablets  (12.5 mg total) by mouth 2 (two) times daily. 30 tablet 0  . risperiDONE (RISPERDAL) 0.25 MG tablet Take 0.25 mg by mouth at bedtime.    . sertraline (ZOLOFT) 25 MG tablet Take three tablets by mouth once daily    . triamcinolone cream (KENALOG) 0.1 % Apply 1 application topically daily.    [name] trypsin-balsam-castor oil (XENADERM) ointment Apply ointment to buttocks and sacrum q shift & prn for prevention    . warfarin (COUMADIN) 5 MG tablet Take 5mg  by mouth once daily Sun, Mon, Tue, Thur Fri and Sat and take 7.5mg  on Wednesday.     No current facility-administered medications on file prior to visit.     Review of Systems   This is somewhat limited since patient is a poor historian.   Provided by  nursing and patient.  General no complaints of fever or chills.  Skin doesn't complain of rashes or itching does have at times history of lower extremity cellulitis.  Head ears eyes nose mouth and throat does not complain of nasal discharge or visual changes does have at times she symptoms.  Respiratory not complain shortness breath or cough has a history COPD.  Cardiac does not complain of chest pain.  GI is not complaining of nausea vomiting diarrhea constipation or abdominal discomfort.  GU does not complain of dysuria has a history of obstructive uropathy is followed by urology-.  muscle skeletal not complaining of joint pain currently ambulates largely in the wheelchair.  Neurologic is not complaining of dizziness headache or numbness.  Psych-behaviors and agitation appears to be improved today compared to earlier this week he is pleasant and appropriate when I assess him  Immunization History  Administered Date(s) Administered  . Influenza,inj,Quad PF,36+ Mos 03/20/2013, 03/23/2014, 02/11/2015  . Pneumococcal Polysaccharide-23 03/02/2007, 02/11/2015  . Td 12/30/1996   Pertinent  Health Maintenance Due  Topic Date Due  . INFLUENZA VACCINE  12/31/2015  . PNA vac Low Risk  Adult (2 of 2 - PCV13) 02/11/2016   Fall Risk  01/04/2015 04/18/2014 08/15/2013  Falls in the past year? Yes Yes Yes  Number falls in past yr: 2 or more 1 2 or more  Injury with Fall? No Yes -  Risk Factor Category  - High Fall Risk High Fall Risk  Risk for fall due to : Impaired balance/gait;Impaired mobility Impaired mobility;Mental status change History of fall(s);Impaired balance/gait;Impaired mobility;Mental status change   Functional Status Survey:   He is afebrile pulse is 62 respirations 16 blood pressure pending   Physical Exam   In general this elderly male in no distress sitting comfortably in his wheelchair.  His skin is warm and dry he does have wrapping of his legs bilaterally with again significant lymphedema.  Eyes pupils appear reactive light visual acuity appears grossly intact.  Oropharynx clear mucous membranes moist.  Chest is clear to auscultation with reduced air entry no labored breathing.  Heart is regular rate and rhythm without murmur gallop or rub has venous stasis changes lymphedema legs bilaterally that are currently wrapped.  Abdomen is obese soft nontender with positive bowel sounds.  GU continue with indwelling Foley catheter could not really appreciate suprapubic tenderness he has dark-colored amber urine in the catheter.  Muscle skeletal continues to ambulate in a wheelchair strength appears to be intact upper extremities lower survey weakness persists but not changed from baseline.  Neurologic grossly intact to speech is clear cranial nerves intact.  Psyche is oriented to self pleasant and appropriate actually apologized for hisagitation  earlier this week   Labs reviewed:  Recent Labs  09/11/15 0720 09/28/15 09/28/15 1200 10/24/15 0705  NA 143 140 140 143  K 4.2 4.5 4.5 4.0  CL 110  --  106 112*  CO2 28  --  25 28  GLUCOSE 103*  --  107* 91  BUN 28* 28* 28* 26*  CREATININE 1.05 1.0 1.02 1.07  CALCIUM 8.6*  --  9.1 8.7*     Recent Labs  02/11/15 0627 04/17/15 0800 10/24/15 0705  AST 20 15 15   ALT 18 10* 14*  ALKPHOS 64 77 76  BILITOT 0.6 0.6 0.3  PROT 6.0* 6.6 6.6  ALBUMIN 2.7* 3.0* 3.1*    Recent Labs  09/11/15 0720 09/28/15 09/28/15 1200 10/24/15 0705  WBC 7.0 8.8 8.8 7.6  NEUTROABS 2.9  --  5.4 3.9  HGB 10.7* 10.8* 10.8* 10.9*  HCT 33.9* 35* 34.7* 35.2*  MCV 85.2  --  84.4 84.8  PLT 227 301 301 237   Lab Results  Component Value Date   TSH 1.960 09/22/2013   Lab Results  Component Value Date   HGBA1C 6.3* 10/24/2015   Lab Results  Component Value Date   CHOL 157 10/06/2012   HDL 34* 10/06/2012   LDLCALC 96 10/06/2012   TRIG 135 10/06/2012   CHOLHDL 4.6 10/06/2012    Significant Diagnostic Results in last 30 days:  No results found.  Assessment/Plan  #1-urine culture grows out 80,000 colonies of East-he does not appear to be symptomatic of any dysuria no fever no chills his behaviors appear to have improved he appears to be asymptomatic at this point will monitor-.  #2 history of mucoid  stools-if possible will try to obtain stool cultures and C. difficile culture-he is not symptomatic again of any abdominal discomfort unclear if this is truly diarrhea     #3history of dementia with behaviors--he  is followed closely by psychiatric nurse practitioner he did have some increased behaviors especially toward male staff members-psychiatric nurse practitioner has addressed this decreased his Zoloft as well gave him and a dose of Risperdal earlier this week and increased his Risperdal at night up to 0.75 mg daily at bedtime-. This appears to have helped   [phone] Of note greater than 25 minutes spent assessing patient-reviewing his chart-discussing his status with nursing staff-and coordinating plan of care-of note greater than 50% of time spent: Coordinating plan of care with nursing input         Edmon Crape, PA-C [phone]

## 2015-10-26 ENCOUNTER — Encounter (HOSPITAL_COMMUNITY)
Admission: RE | Admit: 2015-10-26 | Discharge: 2015-10-26 | Disposition: A | Discharge status: Home / Self Care | Payer: Medicare Other | Source: Skilled Nursing Facility | Attending: *Deleted | Admitting: *Deleted

## 2015-10-26 DIAGNOSIS — Z029 Encounter for administrative examinations, unspecified: Secondary | ICD-10-CM | POA: Diagnosis not present

## 2015-10-26 LAB — C DIFFICILE QUICK SCREEN W PCR REFLEX
C DIFFICILE (CDIFF) TOXIN: NEGATIVE
C DIFFICLE (CDIFF) ANTIGEN: NEGATIVE
C Diff interpretation: NEGATIVE

## 2015-10-27 ENCOUNTER — Encounter: Payer: Self-pay | Admitting: Internal Medicine

## 2015-10-27 LAB — GASTROINTESTINAL PANEL BY PCR, STOOL (REPLACES STOOL CULTURE)
ADENOVIRUS F40/41: NOT DETECTED
ASTROVIRUS: NOT DETECTED
CYCLOSPORA CAYETANENSIS: NOT DETECTED
Campylobacter species: NOT DETECTED
Cryptosporidium: NOT DETECTED
E. coli O157: NOT DETECTED
ENTEROAGGREGATIVE E COLI (EAEC): NOT DETECTED
ENTEROPATHOGENIC E COLI (EPEC): NOT DETECTED
ENTEROTOXIGENIC E COLI (ETEC): NOT DETECTED
Entamoeba histolytica: NOT DETECTED
Giardia lamblia: NOT DETECTED
Norovirus GI/GII: NOT DETECTED
Plesimonas shigelloides: NOT DETECTED
ROTAVIRUS A: NOT DETECTED
SHIGA LIKE TOXIN PRODUCING E COLI (STEC): NOT DETECTED
Salmonella species: NOT DETECTED
Sapovirus (I, II, IV, and V): NOT DETECTED
Shigella/Enteroinvasive E coli (EIEC): NOT DETECTED
VIBRIO CHOLERAE: NOT DETECTED
VIBRIO SPECIES: NOT DETECTED
Yersinia enterocolitica: NOT DETECTED

## 2015-10-29 ENCOUNTER — Encounter: Payer: Self-pay | Admitting: Internal Medicine

## 2015-10-29 ENCOUNTER — Non-Acute Institutional Stay (SKILLED_NURSING_FACILITY): Payer: Medicare Other | Admitting: Internal Medicine

## 2015-10-29 DIAGNOSIS — N390 Urinary tract infection, site not specified: Secondary | ICD-10-CM | POA: Diagnosis not present

## 2015-10-29 DIAGNOSIS — I5022 Chronic systolic (congestive) heart failure: Secondary | ICD-10-CM

## 2015-10-29 DIAGNOSIS — R195 Other fecal abnormalities: Secondary | ICD-10-CM

## 2015-10-29 DIAGNOSIS — R197 Diarrhea, unspecified: Secondary | ICD-10-CM

## 2015-10-29 DIAGNOSIS — I4891 Unspecified atrial fibrillation: Secondary | ICD-10-CM

## 2015-10-29 NOTE — Patient Instructions (Signed)
Florastor  every day if the bowels are loose. This will replace the normal bacteria which  are necessary for formation of normal stool and processing of food.

## 2015-10-29 NOTE — Assessment & Plan Note (Signed)
Urology consultation 11/20/15

## 2015-10-29 NOTE — Progress Notes (Signed)
This encounter was created in error - please disregard.

## 2015-10-29 NOTE — Progress Notes (Signed)
Facility Location: [facility] Room Number: 121-W   This is a nursing facility follow up for specific acute issues   Interim medical record and care since last [facility] visit was updated with review of diagnostic studies and change in clinical status since last visit were documented.  Note: only family and social history pertinent to this assessment are included.  HPI: Apparently liquid stool was reported 5/27 prompting stool cultures and C. Dif studies. According to his nurse this is intermittent ,not persistent phenomena. Urine culture ordered 10/23/21 revealed 80,000 colonies of yeast. Clinical evaluation 5/24 suggest asymptomatic colonization; but he did receive Diflucan and 100 mg daily 3 beginning 5/29. This was in the setting of diabetes which is well controlled.  He had received nitrofurantoin twice a day for 7 days beginning 09/20/15 for MRSA isolated on culture. He has isolated MRSA in the urine on 4 occasions since November 2016. He is on prophylactic antibiotic with Hiprex prescribed by Urology. He has a follow-up appointment with urology 11/20/15. White count have been normal. He has exhibited a mild anemia which is essentially very stable. C. difficile and stool studies have been negative.  Comprehensive review of systems:  Veracity of review of systems is in question due to dementia. He could not give the date. He denies any gastrointestinal or genitourinary symptoms at this time. He does state that he had a "heart attack" last night. He has no chest emesis morning.  Constitutional: No fever,significant weight change, fatigue  Cardiovascular: No chest pain, palpitations,paroxysmal nocturnal dyspnea, claudication, edema  Respiratory: No cough, sputum production,hemoptysis, DOE , significant snoring,apnea  Gastrointestinal: No heartburn,dysphagia,abdominal pain, nausea / vomiting,rectal bleeding, melena,change in bowels Genitourinary: No dysuria,hematuria,  pyuria,  incontinence, nocturia Musculoskeletal: No joint stiffness, joint swelling, weakness,pain Dermatologic: No rash, pruritus, change in appearance of skin Psychiatric: No significant anxiety , depression, insomnia, anorexia Endocrine: No change in hair/skin/ nails, excessive thirst, excessive hunger, excessive urination  Hematologic/lymphatic: No significant bruising, lymphadenopathy,abnormal bleeding  Physical exam:  Pertinent or positive findings: He has ptosis on the left. He is wearing his upper plate but not the lower. Heart sounds are markedly distant. He has a grade 1 systolic murmur. He has minor rhonchi in a scattered distribution. Pedal pulses are decreased, especially posterior tibial pulses. He has 1/2+ pedal edema. General appearance:Adequately nourished; no acute distress , increased work of breathing is present.   Lymphatic: No lymphadenopathy about the head, neck, axilla . Eyes: No conjunctival inflammation or lid edema is present. There is no scleral icterus. Ears:  External ear exam shows no significant lesions or deformities.   Nose:  External nasal examination shows no deformity or inflammation. Nasal mucosa are pink and moist without lesions ,exudates Oral exam: lips and gums are healthy appearing.There is no oropharyngeal erythema or exudate . Neck:  No thyromegaly, masses, tenderness noted.    Heart:  Normal rate and regular rhythm. S1 and S2 normal without gallop, click, rub .  Abdomen:Bowel sounds are normal. Abdomen is soft and nontender with no organomegaly, hernias,masses. GU: deferred  Extremities:  No cyanosis, clubbing Neurologic exam : Strength equal  in upper & lower extremities Balance,Rhomberg,finger to nose testing could not be completed due to clinical state Deep tendon reflexes are equal Skin: Warm & dry w/o tenting. No significant lesions or rash.   #1 intermittent loose stool with negative diarrheal bacterial studies Florastor as needed  would be indicated for loose stools. #2 yeast colonization of urine on culture; status post  3 days of Diflucan. Urology will reassess the significance of this isolate. #3 recurrent MRSA isolation from urine on culture 4 since November 2016. Sensitive to Nitrofurantoin which was prescribed

## 2015-10-29 NOTE — Assessment & Plan Note (Signed)
Florastor  every day if the bowels are loose. This will replace the normal bacteria which  are necessary for formation of normal stool and processing of food. 

## 2015-10-30 ENCOUNTER — Encounter (HOSPITAL_COMMUNITY)
Admission: RE | Admit: 2015-10-30 | Discharge: 2015-10-30 | Disposition: A | Discharge status: Home / Self Care | Payer: Medicare Other | Source: Skilled Nursing Facility | Attending: *Deleted | Admitting: *Deleted

## 2015-10-30 ENCOUNTER — Non-Acute Institutional Stay (SKILLED_NURSING_FACILITY): Payer: Medicare Other | Admitting: Internal Medicine

## 2015-10-30 ENCOUNTER — Encounter: Payer: Self-pay | Admitting: Internal Medicine

## 2015-10-30 DIAGNOSIS — I4891 Unspecified atrial fibrillation: Secondary | ICD-10-CM | POA: Diagnosis not present

## 2015-10-30 DIAGNOSIS — I420 Dilated cardiomyopathy: Secondary | ICD-10-CM

## 2015-10-30 DIAGNOSIS — R079 Chest pain, unspecified: Secondary | ICD-10-CM | POA: Diagnosis not present

## 2015-10-30 DIAGNOSIS — F03918 Unspecified dementia, unspecified severity, with other behavioral disturbance: Secondary | ICD-10-CM

## 2015-10-30 DIAGNOSIS — F0391 Unspecified dementia with behavioral disturbance: Secondary | ICD-10-CM | POA: Diagnosis not present

## 2015-10-30 DIAGNOSIS — Z029 Encounter for administrative examinations, unspecified: Secondary | ICD-10-CM | POA: Diagnosis not present

## 2015-10-30 LAB — PROTIME-INR
INR: 2.58 — AB (ref 0.00–1.49)
Prothrombin Time: 27.3 seconds — ABNORMAL HIGH (ref 11.6–15.2)

## 2015-10-30 LAB — TROPONIN I: Troponin I: <0.03 ng/mL (ref <0.031)

## 2015-10-30 NOTE — Progress Notes (Signed)
Location:  [facility] Room Number: 121/W Place of Service:  SNF [phone]) Provider:  Abigail MiyamotoArlo Wendy Mays  Joel Hopper, MD  Patient Care Team: Joel LawlessWilliam F Hopper, MD as PCP - General (Internal Medicine) Joel PippinJohn Wrenn, MD as Attending Physician (Urology)  Extended Emergency Contact Information Primary Emergency Contact: [name] Address: [address]          [place], [place] [address] [name] Home Phone: [phone] Mobile Phone: [phone] Relation: Daughter Secondary Emergency Contact: [name] Address: [address]          [place], [place] [address] [name] Home Phone: [phone] Mobile Phone: [phone] Relation: Son  Code Status:  DNR Goals of care: Advanced Directive information Advanced Directives 10/30/2015  Does patient have an advance directive? Yes  Type of Advance Directive Out of facility DNR (pink MOST or yellow form)  Does patient want to make changes to advanced directive? No - Patient declined  Copy of advanced directive(s) in chart? Yes     Chief Complaint  Patient presents with  . Acute Visit    Follow-up for chest discomfort    HPI:  Pt is a 82 y.o. male seen today for an acute visit for Follow-up of chest discomfort-apparently this was 2 nights ago.  Patient said he felt some midsternal chest pain before going to bed 2 nights ago went to bed and and has no further recollection of any chest pain.  [name]. He is not reporting any diaphoresis shortness of breath or radiation of this discomfort.  This actually  came up in casual conversation when I saw the patient last night-did order an EKG which did not really show anything acutely changed from previous EKGs showed sinus rhythm with occasional PVCs which he has had before.  Physical exam last night was quite benign and when I reexamined him this morning it was also within normal limits.  We did order a troponin level as well which was unremarkable at less  than 0.03.  Per chart review he does have a history of cardiomyopathy- diastolic CHF .  He did have a cardiac echo done in May 2016 which showed a normal ejection fraction 55-60% with suspicions of grade 1 diastolic dysfunction.  Currently this morning he is sitting in his wheelchair comfortably actually combing his hair.  Last night as well he was comfortable.  His vital signs remained stable.  He does have a history AFIB he is on Lopressor 12.5 mg twice a day-he also continues on Cardura 8 mg a day as well as Cardizem. 20 mg daily-he is on Coumadin for anticoagulation with history of A. fib-.  INR actually was done today which is within normal limits at 2.58.     Past Medical History  Diagnosis Date  . Venous insufficiency   . AAA (abdominal aortic aneurysm) (HCC)   . Ulcer   . DVT (deep venous thrombosis) (HCC) 05/04/2013    "LLE"  . Cellulitis   . Nicotine dependence   . Bronchospasm   . Elevated PSA   . BPH (benign prostatic hyperplasia)   . COPD (chronic obstructive pulmonary disease) (HCC)   . Cognitive impairment   . Daily headache   . Arthritis     "probably on the right leg/hip" (09/20/2013)  . Dementia     "don't know exactly what kind" (09/20/2013)  . Kidney stones   . Glaucoma, both eyes   . Atrial fibrillation (HCC)   . Fall at home September 18, 2013  .  Benign localized hyperplasia of prostate with urinary retention 12/23/2014  . AAA (abdominal aortic aneurysm) without rupture (HCC) 10/06/2012  . Anticoagulation goal of INR 2 to 3 06/01/2013  . B12 deficiency 09/20/2013  . Bladder calculus 12/23/2014    2.6cm and present since at least 2011 when it was about 1.6cm.   . Cardiomyopathy (HCC) 10/15/2014  . CHF (congestive heart failure) (HCC) 09/26/2013  . Chronic venous insufficiency 10/06/2012  . Edema-Bilat Leg and foot 03/07/2013  . Multifactorial dementia 10/06/2012  . Obesity, unspecified 10/06/2012  . Peripheral vascular disease (HCC) 09/20/2012  . Pyelonephritis,  acute 12/22/2014  . Weakness 09/18/2013  . Wound cellulitis     let ankle wound size of half dollar per nurse at [facility] on 03/25/2015   Past Surgical History  Procedure Laterality Date  . Cataract extraction w/ intraocular lens  implant, bilateral Bilateral   . Insitu percutaneous pinning femur Right 1986  . Hip fracture surgery Right 2009  . Back surgery    . Posterior laminectomy / decompression lumbar spine  2004    Decompressive laminectomy unilaterally on the right at L4-5 with  . Femur fracture surgery Right 04/1985  . Cystoscopy w/ stone manipulation  1970's    "once"  . Spine surgery    . Eye surgery    . Cholecystectomy  2011    Gall Bladder  . Cystoscopy with litholapaxy N/A 03/28/2015    Procedure: CYSTOSCOPY WITH LITHOLAPAXY;  Surgeon: Malen Gauze, MD;  Location: [facility];  Service: Urology;  Laterality: N/A;  . Transurethral resection of prostate N/A 03/28/2015    Procedure: TRANSURETHRAL RESECTION OF THE PROSTATE ;  Surgeon: Malen Gauze, MD;  Location: [facility];  Service: Urology;  Laterality: N/A;    Allergies  Allergen Reactions  . Oxycodone Hcl Other (See Comments)     makes pt "wild"  . Propoxyphene N-Acetaminophen Other (See Comments)     Makes pt. "wild"    Current Outpatient Prescriptions on File Prior to Visit  Medication Sig Dispense Refill  . acetaminophen (TYLENOL) 500 MG tablet Take 1,000 mg by mouth every 6 (six) hours as needed for mild pain or fever.     . Amino Acids-Protein Hydrolys (FEEDING SUPPLEMENT, PRO-STAT SUGAR FREE 64,) LIQD Take 30 mLs by mouth daily.    [name] diltiazem (CARDIZEM CD) 120 MG 24 hr capsule Take 120 mg by mouth every morning.     [name] doxazosin (CARDURA) 8 MG tablet Take 8 mg by mouth every morning.     [name] HYDROcodone-acetaminophen (NORCO/VICODIN) 5-325 MG tablet Take one tablet by mouth every 4 hours as needed for moderate pain. Max APAP--3gm/24 hrs from all sources 180 tablet 0  . ipratropium-albuterol  (DUONEB) 0.5-2.5 (3) MG/3ML SOLN 3ml via Neb inhalation every 6 hours for congestion scheduled and 4 times daily as needed for SOB or congestion    . latanoprost (XALATAN) 0.005 % ophthalmic solution Place 1 drop into both eyes at bedtime.     . methenamine (HIPREX) 1 g tablet Take 1 g by mouth 2 (two) times daily with a meal. Holding while on Macrobid    . metoprolol tartrate (LOPRESSOR) 25 MG tablet Take 0.5 tablets (12.5 mg total) by mouth 2 (two) times daily. 30 tablet 0  . risperiDONE (RISPERDAL) 0.25 MG tablet Take 0.25 mg by mouth at bedtime.    . sertraline (ZOLOFT) 25 MG tablet 3 by mouth daily    . triamcinolone cream (KENALOG) 0.1 % Apply thin layer  to left leg before wrapping once on Mon,Wed,Fri.    . warfarin (COUMADIN) 5 MG tablet Take  by mouth once daily Sun, Mon, Tue, Thur Fri and Sat and take 7.5mg  on Wednesday.     No current facility-administered medications on file prior to visit.     Review of Systems   In general is not complaining any fever or chills no chest pain.  The skin is not complaining of diaphoresis.  Head ears eyes nose mouth and throat does have times have some watery discharge from his eyes with a history of allergic rhinitis and has been outdoors some.  Resp. No complaints of shortness breath or cough he does have COPD is on nebulizers routinely  Cardiac no complaints of chest pain has chronic venous stasis changes edema legs currently wrapped.  Abdomen is does not complain of any abdominal discomfort nausea vomiting diarrhea constipation.  GU does have a chronic indwelling Foley catheter.  Must skeletal skeletal is not complaining of joint pain continues with significant lower extremity weakness which is not new.  Neurologic is not complaining of any dizziness headache or syncopal-type feelings.  Psych has had some increased behaviors agitation recently he has she was seen by the psychiatric nurse practitioner today his Risperdal has been  discontinued he has been started on Depakote   .    Immunization History  Administered Date(s) Administered  . Influenza,inj,Quad PF,36+ Mos 03/20/2013, 03/23/2014, 02/11/2015  . PPD Test 12/26/2014, 01/09/2015  . Pneumococcal Polysaccharide-23 03/02/2007, 02/11/2015  . Td 12/30/1996   Pertinent  Health Maintenance Due  Topic Date Due  . INFLUENZA VACCINE  12/31/2015  . PNA vac Low Risk Adult (2 of 2 - PCV13) 02/11/2016   Fall Risk  01/04/2015 04/18/2014 08/15/2013  Falls in the past year? Yes Yes Yes  Number falls in past yr: 2 or more 1 2 or more  Injury with Fall? No Yes -  Risk Factor Category  - High Fall Risk High Fall Risk  Risk for fall due to : Impaired balance/gait;Impaired mobility Impaired mobility;Mental status change History of fall(s);Impaired balance/gait;Impaired mobility;Mental status change   Functional Status Survey:    Filed Vitals:   10/30/15 1327  BP: 123/66  Pulse: 61  Temp: 97.9 F (36.6 C)  TempSrc: Oral  Resp: 18  Height:  (1.93 m)  Weight: 308 lb (139.708 kg)  Of note weight today is 306 baseline weight appears to be between 300-308 with some scale variation Body mass index is 37.51 kg/(m^2). Physical Exam  In general this is a pleasant elderly male in no distress sitting couple his wheelchair is actually rooming his hair.  His skin is warm and dry.  Eyes pupils appear reactive light sclera and conjunctiva appear clear he does have some clear drainage from his eyes again he does have some history of allergic rhinitis.  Oropharynx clear mucous membranes moist.  Chest he continues with scattered rhonchi and there is no labored breathing.  Heart somewhat distant heart sounds largely regular rate and rhythm he does have a history of a murmur but this appears to be fairly slight I was a grade 1.  He continues with baseline lower extremity edema venous stasis cyst Lasix currently wrapped.  Abdomen is obese soft nontender with positive  bowel sounds.  Muscle skeletal is able to move all extremities 4 with lower extremity weakness which is baseline.  Neurologic is grossly intact strength appears preserved upper and lower extremities although reduced lower extremities with significant edema  venous stasis changes.  Cranial nerves appear grossly intact to speech is clear.  Psych he is oriented to self is pleasant and conversant cooperative with exam today apparently there's been some increased agitation as noted above.    Labs reviewed:  10/24/2015.  WBC 7.6 hemoglobin 10.9 platelets 237.  Sodium 143 potassium 4.0 BUN 26 creatinine 1.07.  ALT 14.  Albumin 3.1.  Otherwise liver function tests within normal limits.    Recent Labs  09/11/15 0720 09/28/15 09/28/15 1200 10/24/15 0705  NA 143 140 140 143  K 4.2 4.5 4.5 4.0  CL 110  --  106 112*  CO2 28  --  25 28  GLUCOSE 103*  --  107* 91  BUN 28* 28* 28* 26*  CREATININE 1.05 1.0 1.02 1.07  CALCIUM 8.6*  --  9.1 8.7*    Recent Labs  02/11/15 0627 04/17/15 0800 10/24/15 0705  AST 20 15 15   ALT 18 10* 14*  ALKPHOS 64 77 76  BILITOT 0.6 0.6 0.3  PROT 6.0* 6.6 6.6  ALBUMIN 2.7* 3.0* 3.1*    Recent Labs  09/11/15 0720 09/28/15 09/28/15 1200 10/24/15 0705  WBC 7.0 8.8 8.8 7.6  NEUTROABS 2.9  --  5.4 3.9  HGB 10.7* 10.8* 10.8* 10.9*  HCT 33.9* 35* 34.7* 35.2*  MCV 85.2  --  84.4 84.8  PLT 227 301 301 237   Lab Results  Component Value Date   TSH 1.960 09/22/2013   Lab Results  Component Value Date   HGBA1C 6.3* 10/24/2015   Lab Results  Component Value Date   CHOL 157 10/06/2012   HDL 34* 10/06/2012   LDLCALC 96 10/06/2012   TRIG 135 10/06/2012   CHOLHDL 4.6 10/06/2012    Significant Diagnostic Results in last 30 days:  No results found.  Assessment/Plan  #1-chest pain unspecified-apparently this is of short duration 2 nights ago and there is been no reoccurrence.  EKG did not really show any acute changes-troponin was  unremarkable-at this point will monitor patient appears to be at his baseline monitor for any recurrence and this was discussed with nursing staff.  #2 atrial fibrillation this appears rate controlled-EKG actually showed sinus rhythm with occasional irregularities-- he is on Cardizem as well as Lopressor is on Coumadin 5 mg every day except 7.5 mg on Wednesdays this is stable at 2.58.  I do note he has finished a short course of Diflucan for yeast in his urine this can affect the INR but appears INR nonetheless is stable this will warrant monitoring next week to ensure stability.  He appears to be stable in this regard as well.  #3 history of dementia with behaviors again has been seen by psychiatric nurse practitioner respiratory does been discontinued and Depakote started.  He continues on Zoloft as well-she was pleasant and appropriate on exam today although again there are periods apparently of behaviors especially with male staff members at this point will monitor.  [id number] note greater than 40 minutes spent assessing patient reviewing his chart-his diagnostic studies-discussing his status with nursing-and coordinating and formulating a plan of care-of note greater than 50% of time spent coordinating plan of care with initial assessment of patient reassessment of patient today and chart review         [place] Sheer, [place] [phone]

## 2015-10-31 ENCOUNTER — Encounter (HOSPITAL_COMMUNITY)
Admission: RE | Admit: 2015-10-31 | Discharge: 2015-10-31 | Disposition: A | Discharge status: Home / Self Care | Payer: Medicare Other | Source: Skilled Nursing Facility | Attending: Internal Medicine | Admitting: Internal Medicine

## 2015-10-31 ENCOUNTER — Encounter: Payer: Self-pay | Admitting: Internal Medicine

## 2015-10-31 ENCOUNTER — Non-Acute Institutional Stay (SKILLED_NURSING_FACILITY): Payer: Medicare Other | Admitting: Internal Medicine

## 2015-10-31 DIAGNOSIS — F0391 Unspecified dementia with behavioral disturbance: Secondary | ICD-10-CM | POA: Insufficient documentation

## 2015-10-31 DIAGNOSIS — F39 Unspecified mood [affective] disorder: Secondary | ICD-10-CM | POA: Insufficient documentation

## 2015-10-31 DIAGNOSIS — J683 Other acute and subacute respiratory conditions due to chemicals, gases, fumes and vapors: Secondary | ICD-10-CM

## 2015-10-31 DIAGNOSIS — I5032 Chronic diastolic (congestive) heart failure: Secondary | ICD-10-CM | POA: Insufficient documentation

## 2015-10-31 DIAGNOSIS — R0789 Other chest pain: Secondary | ICD-10-CM | POA: Diagnosis not present

## 2015-10-31 DIAGNOSIS — Z7901 Long term (current) use of anticoagulants: Secondary | ICD-10-CM | POA: Insufficient documentation

## 2015-10-31 DIAGNOSIS — F0390 Unspecified dementia without behavioral disturbance: Secondary | ICD-10-CM | POA: Insufficient documentation

## 2015-10-31 DIAGNOSIS — I482 Chronic atrial fibrillation: Secondary | ICD-10-CM | POA: Insufficient documentation

## 2015-10-31 DIAGNOSIS — H409 Unspecified glaucoma: Secondary | ICD-10-CM | POA: Insufficient documentation

## 2015-10-31 DIAGNOSIS — F3289 Other specified depressive episodes: Secondary | ICD-10-CM | POA: Insufficient documentation

## 2015-10-31 DIAGNOSIS — I10 Essential (primary) hypertension: Secondary | ICD-10-CM | POA: Insufficient documentation

## 2015-10-31 DIAGNOSIS — J4520 Mild intermittent asthma, uncomplicated: Secondary | ICD-10-CM

## 2015-10-31 NOTE — Progress Notes (Signed)
Patient ID: [name], male   DOB: 01-05-1932, 82 y.o.   MRN: [id number]     [facility]: 121  Chief Complaint  Patient presents with  . Acute Visit    Chest Pains   Allergies  Allergen Reactions  . Oxycodone Hcl Other (See Comments)     makes pt "wild"  . Propoxyphene N-Acetaminophen Other (See Comments)     Makes pt. "wild"    This is a nursing facility follow up for specific acute issue of "heart attack" . The reason  he believes he had heart attack is "someone told me" the evening of 5/29. He states that the pain "went to (his) back".  He has no active cardio pulmonary symptoms at this time.   Interim medical record and care since last [facility] visit was updated with review of diagnostic studies and change in clinical status since last visit were documented.  Troponin was negative. EKG revealed left axis deviation;PVCs; and poor R-wave progression across precordium. There are no ischemic changes.   Review of systems:Constitutional: No fever,significant weight change, fatigue   Cardiovascular: No chest pain, palpitations,paroxysmal nocturnal dyspnea, claudication, edema  Respiratory: No cough, sputum production,hemoptysis, DOE , significant snoring,apnea   Gastrointestinal: No heartburn,dysphagia,abdominal pain,    Physical exam:  Pertinent or positive findings: He is more alert than usual. He actually gave the correct date as June 1 but then stated it was "16 or 17". He could not remember the president's name but stated "they're trying to remove him". He has low-grade diffuse rhonchi increased work of breathing. Heart sounds are distant and slightly irregular. There is a faint murmur He has edema of the lower extremities with minimal pitting. Homans sign is negative. Pedal pulses are decreased. General appearance:Adequately nourished; no acute distress , increased work of breathing is present.   Lymphatic: No lymphadenopathy about the head, neck,  axilla . Eyes: No conjunctival inflammation or lid edema is present. There is no scleral icterus. Nose:  External nasal examination shows no deformity or inflammation. Nasal mucosa are pink and moist without lesions ,exudates Neck:  No thyromegaly, masses, tenderness noted.    Heart:  No  gallop, click, rub .  Abdomen:Bowel sounds are normal. Abdomen is soft and nontender with no organomegaly, hernias,masses. Extremities:  No cyanosis, clubbing  Skin: Warm & dry w/o tenting. No significant lesions or rash.    #1 complaint of "heart attack" in a patient with advanced dementia EKG and cardiac enzymes did not suggest any cardiac injury. #2 bronchospastic disease. Advair will be ordered as maintenance.

## 2015-10-31 NOTE — Patient Instructions (Signed)
Advair 250/50 one inhalation bid as maintenance prophylaxis of brochospastic lung disease

## 2015-11-04 ENCOUNTER — Encounter (HOSPITAL_COMMUNITY)
Admission: RE | Admit: 2015-11-04 | Discharge: 2015-11-04 | Disposition: A | Discharge status: Home / Self Care | Payer: Medicare Other | Source: Skilled Nursing Facility | Attending: Internal Medicine | Admitting: Internal Medicine

## 2015-11-04 DIAGNOSIS — F3289 Other specified depressive episodes: Secondary | ICD-10-CM | POA: Diagnosis present

## 2015-11-04 DIAGNOSIS — H409 Unspecified glaucoma: Secondary | ICD-10-CM | POA: Diagnosis not present

## 2015-11-04 DIAGNOSIS — F39 Unspecified mood [affective] disorder: Secondary | ICD-10-CM | POA: Diagnosis not present

## 2015-11-04 DIAGNOSIS — I5032 Chronic diastolic (congestive) heart failure: Secondary | ICD-10-CM | POA: Diagnosis not present

## 2015-11-04 DIAGNOSIS — F0390 Unspecified dementia without behavioral disturbance: Secondary | ICD-10-CM | POA: Diagnosis present

## 2015-11-04 DIAGNOSIS — F0391 Unspecified dementia with behavioral disturbance: Secondary | ICD-10-CM | POA: Diagnosis not present

## 2015-11-04 DIAGNOSIS — I10 Essential (primary) hypertension: Secondary | ICD-10-CM | POA: Diagnosis not present

## 2015-11-04 DIAGNOSIS — I482 Chronic atrial fibrillation: Secondary | ICD-10-CM | POA: Diagnosis not present

## 2015-11-04 DIAGNOSIS — Z7901 Long term (current) use of anticoagulants: Secondary | ICD-10-CM | POA: Diagnosis not present

## 2015-11-04 LAB — HEPATIC FUNCTION PANEL
ALT: 12 U/L — AB (ref 17–63)
AST: 13 U/L — AB (ref 15–41)
Albumin: 2.9 g/dL — ABNORMAL LOW (ref 3.5–5.0)
Alkaline Phosphatase: 65 U/L (ref 38–126)
Bilirubin, Direct: 0.1 mg/dL (ref 0.1–0.5)
Indirect Bilirubin: 0.3 mg/dL (ref 0.3–0.9)
TOTAL PROTEIN: 6.3 g/dL — AB (ref 6.5–8.1)
Total Bilirubin: 0.4 mg/dL (ref 0.3–1.2)

## 2015-11-04 LAB — PROTIME-INR
INR: 2.86 — AB (ref 0.00–1.49)
Prothrombin Time: 29.5 seconds — ABNORMAL HIGH (ref 11.6–15.2)

## 2015-11-04 LAB — CBC
HEMATOCRIT: 32.6 % — AB (ref 39.0–52.0)
HEMOGLOBIN: 10.2 g/dL — AB (ref 13.0–17.0)
MCH: 26.7 pg (ref 26.0–34.0)
MCHC: 31.3 g/dL (ref 30.0–36.0)
MCV: 85.3 fL (ref 78.0–100.0)
Platelets: 209 10*3/uL (ref 150–400)
RBC: 3.82 MIL/uL — ABNORMAL LOW (ref 4.22–5.81)
RDW: 18 % — ABNORMAL HIGH (ref 11.5–15.5)
WBC: 8.1 10*3/uL (ref 4.0–10.5)

## 2015-11-05 ENCOUNTER — Encounter: Payer: Self-pay | Admitting: Internal Medicine

## 2015-11-08 ENCOUNTER — Encounter (HOSPITAL_COMMUNITY)
Admission: RE | Admit: 2015-11-08 | Discharge: 2015-11-08 | Disposition: A | Discharge status: Home / Self Care | Payer: Medicare Other | Source: Skilled Nursing Facility | Attending: Internal Medicine | Admitting: Internal Medicine

## 2015-11-08 DIAGNOSIS — I5032 Chronic diastolic (congestive) heart failure: Secondary | ICD-10-CM | POA: Diagnosis not present

## 2015-11-08 DIAGNOSIS — I10 Essential (primary) hypertension: Secondary | ICD-10-CM | POA: Diagnosis not present

## 2015-11-08 DIAGNOSIS — Z7901 Long term (current) use of anticoagulants: Secondary | ICD-10-CM | POA: Diagnosis not present

## 2015-11-08 DIAGNOSIS — I482 Chronic atrial fibrillation: Secondary | ICD-10-CM | POA: Diagnosis not present

## 2015-11-08 DIAGNOSIS — H409 Unspecified glaucoma: Secondary | ICD-10-CM | POA: Diagnosis not present

## 2015-11-08 LAB — CBC WITH DIFFERENTIAL/PLATELET
BASOS ABS: 0 10*3/uL (ref 0.0–0.1)
BASOS PCT: 1 %
EOS ABS: 1.3 10*3/uL — AB (ref 0.0–0.7)
Eosinophils Relative: 18 %
HEMATOCRIT: 36.6 % — AB (ref 39.0–52.0)
HEMOGLOBIN: 11.4 g/dL — AB (ref 13.0–17.0)
Lymphocytes Relative: 25 %
Lymphs Abs: 1.8 10*3/uL (ref 0.7–4.0)
MCH: 26.6 pg (ref 26.0–34.0)
MCHC: 31.1 g/dL (ref 30.0–36.0)
MCV: 85.5 fL (ref 78.0–100.0)
MONO ABS: 0.9 10*3/uL (ref 0.1–1.0)
Monocytes Relative: 12 %
NEUTROS ABS: 3.2 10*3/uL (ref 1.7–7.7)
NEUTROS PCT: 44 %
Platelets: 254 10*3/uL (ref 150–400)
RBC: 4.28 MIL/uL (ref 4.22–5.81)
RDW: 17.6 % — AB (ref 11.5–15.5)
WBC: 7.2 10*3/uL (ref 4.0–10.5)

## 2015-11-08 LAB — PROTIME-INR
INR: 2.92 — AB (ref 0.00–1.49)
PROTHROMBIN TIME: 30 s — AB (ref 11.6–15.2)

## 2015-11-11 ENCOUNTER — Other Ambulatory Visit (HOSPITAL_COMMUNITY)
Admission: RE | Admit: 2015-11-11 | Discharge: 2015-11-11 | Disposition: A | Discharge status: Home / Self Care | Payer: Medicare Other | Source: Skilled Nursing Facility | Attending: Internal Medicine | Admitting: Internal Medicine

## 2015-11-11 DIAGNOSIS — I5032 Chronic diastolic (congestive) heart failure: Secondary | ICD-10-CM | POA: Diagnosis not present

## 2015-11-11 LAB — PROTIME-INR
INR: 3.22 — ABNORMAL HIGH (ref 0.00–1.49)
PROTHROMBIN TIME: 32.3 s — AB (ref 11.6–15.2)

## 2015-11-14 DIAGNOSIS — R05 Cough: Secondary | ICD-10-CM | POA: Diagnosis not present

## 2015-11-15 ENCOUNTER — Other Ambulatory Visit (HOSPITAL_COMMUNITY)
Admission: AD | Admit: 2015-11-15 | Discharge: 2015-11-15 | Disposition: A | Discharge status: Home / Self Care | Payer: Medicare Other | Source: Skilled Nursing Facility | Attending: Internal Medicine | Admitting: Internal Medicine

## 2015-11-15 ENCOUNTER — Non-Acute Institutional Stay (SKILLED_NURSING_FACILITY): Payer: Medicare Other | Admitting: Internal Medicine

## 2015-11-15 ENCOUNTER — Encounter: Payer: Self-pay | Admitting: Internal Medicine

## 2015-11-15 DIAGNOSIS — N289 Disorder of kidney and ureter, unspecified: Secondary | ICD-10-CM | POA: Diagnosis not present

## 2015-11-15 DIAGNOSIS — J188 Other pneumonia, unspecified organism: Secondary | ICD-10-CM | POA: Diagnosis not present

## 2015-11-15 DIAGNOSIS — I5032 Chronic diastolic (congestive) heart failure: Secondary | ICD-10-CM | POA: Insufficient documentation

## 2015-11-15 DIAGNOSIS — Z7901 Long term (current) use of anticoagulants: Secondary | ICD-10-CM | POA: Diagnosis not present

## 2015-11-15 DIAGNOSIS — N390 Urinary tract infection, site not specified: Secondary | ICD-10-CM | POA: Diagnosis not present

## 2015-11-15 DIAGNOSIS — M1990 Unspecified osteoarthritis, unspecified site: Secondary | ICD-10-CM | POA: Diagnosis not present

## 2015-11-15 DIAGNOSIS — R509 Fever, unspecified: Secondary | ICD-10-CM

## 2015-11-15 HISTORY — DX: Unspecified Escherichia coli (E. coli) as the cause of diseases classified elsewhere: B96.20

## 2015-11-15 HISTORY — DX: Urinary tract infection, site not specified: N39.0

## 2015-11-15 LAB — PROTIME-INR
INR: 2.76 — ABNORMAL HIGH (ref 0.00–1.49)
Prothrombin Time: 28.8 seconds — ABNORMAL HIGH (ref 11.6–15.2)

## 2015-11-15 LAB — CBC WITH DIFFERENTIAL/PLATELET
Basophils Absolute: 0 10*3/uL (ref 0.0–0.1)
Basophils Relative: 0 %
Eosinophils Absolute: 0.5 10*3/uL (ref 0.0–0.7)
Eosinophils Relative: 6 %
HCT: 34.7 % — ABNORMAL LOW (ref 39.0–52.0)
Hemoglobin: 11.2 g/dL — ABNORMAL LOW (ref 13.0–17.0)
Lymphocytes Relative: 14 %
Lymphs Abs: 1.2 10*3/uL (ref 0.7–4.0)
MCH: 27.3 pg (ref 26.0–34.0)
MCHC: 32.3 g/dL (ref 30.0–36.0)
MCV: 84.6 fL (ref 78.0–100.0)
Monocytes Absolute: 1.3 10*3/uL — ABNORMAL HIGH (ref 0.1–1.0)
Monocytes Relative: 15 %
Neutro Abs: 5.6 10*3/uL (ref 1.7–7.7)
Neutrophils Relative %: 65 %
Platelets: 267 10*3/uL (ref 150–400)
RBC: 4.1 MIL/uL — ABNORMAL LOW (ref 4.22–5.81)
RDW: 17.6 % — ABNORMAL HIGH (ref 11.5–15.5)
WBC: 8.7 10*3/uL (ref 4.0–10.5)

## 2015-11-15 LAB — URINALYSIS, ROUTINE W REFLEX MICROSCOPIC
Bilirubin Urine: NEGATIVE
Glucose, UA: NEGATIVE mg/dL
KETONES UR: NEGATIVE mg/dL
NITRITE: POSITIVE — AB
PH: 5.5 (ref 5.0–8.0)
Protein, ur: 100 mg/dL — AB
SPECIFIC GRAVITY, URINE: 1.02 (ref 1.005–1.030)

## 2015-11-15 LAB — URINE MICROSCOPIC-ADD ON

## 2015-11-15 LAB — COMPREHENSIVE METABOLIC PANEL
ALBUMIN: 3.1 g/dL — AB (ref 3.5–5.0)
ALK PHOS: 59 U/L (ref 38–126)
ALT: 14 U/L — AB (ref 17–63)
ANION GAP: 6 (ref 5–15)
AST: 17 U/L (ref 15–41)
BILIRUBIN TOTAL: 0.6 mg/dL (ref 0.3–1.2)
BUN: 37 mg/dL — ABNORMAL HIGH (ref 6–20)
CALCIUM: 8.5 mg/dL — AB (ref 8.9–10.3)
CO2: 27 mmol/L (ref 22–32)
CREATININE: 1.3 mg/dL — AB (ref 0.61–1.24)
Chloride: 107 mmol/L (ref 101–111)
GFR calc non Af Amer: 49 mL/min — ABNORMAL LOW (ref >60)
GFR, EST AFRICAN AMERICAN: 57 mL/min — AB (ref >60)
GLUCOSE: 105 mg/dL — AB (ref 65–99)
Potassium: 4 mmol/L (ref 3.5–5.1)
Sodium: 140 mmol/L (ref 135–145)
TOTAL PROTEIN: 6.5 g/dL (ref 6.5–8.1)

## 2015-11-15 NOTE — Progress Notes (Signed)
Location:  [facility] Room Number: 121/W Place of Service:  SNF [phone]) Provider:  Abigail Miyamoto, MD  Patient Care Team: Pecola Lawless, MD as PCP - General (Internal Medicine) [name], MD as Attending Physician (Urology)  Extended Emergency Contact Information Primary Emergency Contact: [name],[name] Address: [address]          [place], [place] [address] [name] of [place] Home Phone: [phone] Mobile Phone: [phone] Relation: Daughter Secondary Emergency Contact: Kinnear,[name] Address: [address]          [place], [place] [address] [name] of [place] Home Phone: [phone] Mobile Phone: [phone] Relation: Son  Code Status: DNR  Goals of care: Advanced Directive information Advanced Directives 11/15/2015  Does patient have an advance directive? Yes  Type of Advance Directive Out of facility DNR (pink MOST or yellow form)  Does patient want to make changes to advanced directive? No - Patient declined  Copy of advanced directive(s) in chart? Yes  Pre-existing out of facility DNR order (yellow form or pink MOST form) -     Chief Complaint  Patient presents with  . Acute Visit    Fever of unknown origin    HPI:  Pt is a 82 y.o. male seen today for an acute visit for A fever that spiked apparently slightly over 101 last night.  Onl call provider did order lab work including a CBC metabolic panel blood cultures and a urine culture.  Blood cultures and urine cultures are pending blood work has come back otherwise fairly unremarkable creatinine is slightly above his baseline at 1.3-white count is within normal limits at 8.7.  He is afebrile today appears to be largely at his baseline.  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  He does not complain of any increased cough or congestion he does have a history of significant COPD and respiratory issues as well.  A chest x-ray is pending   Past Medical History  Diagnosis Date  . Venous insufficiency   . AAA (abdominal aortic aneurysm) (HCC)   . Ulcer   . DVT (deep venous thrombosis) (HCC) 05/04/2013    "LLE"  . Cellulitis   . Nicotine dependence   . Bronchospasm   . Elevated PSA   . BPH (benign prostatic hyperplasia)   . COPD (chronic obstructive pulmonary disease) (HCC)   . Cognitive impairment   . Daily headache   . Arthritis     "probably on the right leg/hip" (09/20/2013)  . Dementia     "don't know exactly what kind" (09/20/2013)  . Kidney stones   . Glaucoma, both eyes   . Atrial fibrillation (HCC)   . Fall at home September 18, 2013  . Benign localized hyperplasia of prostate with urinary retention 12/23/2014  . AAA (abdominal aortic aneurysm) without rupture (HCC) 10/06/2012  . Anticoagulation goal of INR 2 to 3 06/01/2013  . B12 deficiency 09/20/2013  . Bladder calculus 12/23/2014    2.6cm and present since at least 2011 when it was about 1.6cm.   . Cardiomyopathy (HCC) 10/15/2014  . CHF (congestive heart failure) (HCC) 09/26/2013  . Chronic venous insufficiency 10/06/2012  . Edema-Bilat Leg and foot 03/07/2013  .  Multifactorial dementia 10/06/2012  . Obesity, unspecified 10/06/2012  . Peripheral vascular disease (HCC) 09/20/2012  . Pyelonephritis, acute 12/22/2014  . Weakness 09/18/2013  . Wound cellulitis     let ankle wound size of half dollar per nurse at [facility] on 03/25/2015   Past Surgical History  Procedure Laterality Date  . Cataract extraction w/ intraocular lens  implant, bilateral Bilateral   . Insitu percutaneous pinning femur Right 1986  . Hip fracture surgery Right 2009  . Back surgery    . Posterior laminectomy / decompression lumbar  spine  2004    Decompressive laminectomy unilaterally on the right at L4-5 with  . Femur fracture surgery Right 04/1985  . Cystoscopy w/ stone manipulation  1970's    "once"  . Spine surgery    . Eye surgery    . Cholecystectomy  2011    Gall Bladder  . Cystoscopy with litholapaxy N/A 03/28/2015    Procedure: CYSTOSCOPY WITH LITHOLAPAXY;  Surgeon: Malen Gauze, MD;  Location: [facility];  Service: Urology;  Laterality: N/A;  . Transurethral resection of prostate N/A 03/28/2015    Procedure: TRANSURETHRAL RESECTION OF THE PROSTATE ;  Surgeon: Malen Gauze, MD;  Location: [facility];  Service: Urology;  Laterality: N/A;    Allergies  Allergen Reactions  . Oxycodone Hcl Other (See Comments)     makes pt "wild"  . Propoxyphene N-Acetaminophen Other (See Comments)     Makes pt. "wild"    Current Outpatient Prescriptions on File Prior to Visit  Medication Sig Dispense Refill  . acetaminophen (TYLENOL) 500 MG tablet Take 1,000 mg by mouth every 6 (six) hours as needed for mild pain or fever.     . Amino Acids-Protein Hydrolys (FEEDING SUPPLEMENT, PRO-STAT SUGAR FREE 64,) LIQD Take 30 mLs by mouth daily.    [name] diltiazem (CARDIZEM CD) 120 MG 24 hr capsule Take 120 mg by mouth every morning.     [name] doxazosin (CARDURA) 8 MG tablet Take 8 mg by mouth every morning.     [name] HYDROcodone-acetaminophen (NORCO/VICODIN) 5-325 MG tablet Take one tablet by mouth every 4 hours as needed for moderate pain. Max APAP--3gm/24 hrs from all sources 180 tablet 0  . ipratropium-albuterol (DUONEB) 0.5-2.5 (3) MG/3ML SOLN 3ml via Neb inhalation three times a day for congestion scheduled and 4 times daily as needed for SOB or congestion    . latanoprost (XALATAN) 0.005 % ophthalmic solution Place 1 drop into both eyes at bedtime.     . methenamine (HIPREX) 1 g tablet Take 1 g by mouth 2 (two) times daily with a meal. Holding while on Macrobid    . metoprolol tartrate (LOPRESSOR) 25 MG tablet Take 0.5 tablets (12.5  mg total) by mouth 2 (two) times daily. 30 tablet 0  . sertraline (ZOLOFT) 25 MG tablet Take three tablets by mouth once daily    . triamcinolone cream (KENALOG) 0.1 % Apply triamcinolone 0.1 % and 1/2 cetaphil cream  to both Rt. and Lt. leg before wrapping on Mon,Wed,Fri.    . warfarin (COUMADIN) 5 MG tablet Take  by mouth once an evening     No current facility-administered medications on file prior to visit.     Review of Systems somewhat limited secondary patient being a poor historian provided by patient and nursing staff as well as his wife who is in the room. In general is not currently complaining of any fever or chills.  Skin does not complain of rashes or  itching he does have chronic venous stasis changes lower legs which are currently wrapped he does have at times history of infection here but apparently this is stable per nursing.  Head ears eyes nose mouth and throat does not complaining of any visual changes sore throat.  Respiratory is not complaining of any increased cough or congestion beyond baseline.  Cardiac not complaining of chest pain continues with chronic lower extremity edema and lymphedema.  GI is not complaining of nausea vomiting diarrhea or constipation or abdominal discomfort.  GU does have a chronic indwelling Foley catheter is not complaining of burning with urination however.  Musculoskeletal has continued lower extremity weakness but this is not new does not complain of joint pain.  Neurologic is not complaining of dizziness headache or syncopal-type feelings or numbness.  Psych does have a history of some dementia this appears to be moderate does not complain of depression appears to be at his baseline today  Immunization History  Administered Date(s) Administered  . Influenza,inj,Quad PF,36+ Mos 03/20/2013, 03/23/2014, 02/11/2015  . PPD Test 12/26/2014, 01/09/2015  . Pneumococcal Polysaccharide-23 03/02/2007, 02/11/2015  . Td 12/30/1996    Pertinent  Health Maintenance Due  Topic Date Due  . INFLUENZA VACCINE  12/31/2015  . PNA vac Low Risk Adult (2 of 2 - PCV13) 02/11/2016   Fall Risk  01/04/2015 04/18/2014 08/15/2013  Falls in the past year? Yes Yes Yes  Number falls in past yr: 2 or more 1 2 or more  Injury with Fall? No Yes -  Risk Factor Category  - High Fall Risk High Fall Risk  Risk for fall due to : Impaired balance/gait;Impaired mobility Impaired mobility;Mental status change History of fall(s);Impaired balance/gait;Impaired mobility;Mental status change   Functional Status Survey:    Filed Vitals:   11/15/15 1235  BP: 107/61  Pulse: 64  Temp: 98 F (36.7 C)  TempSrc: Oral  Resp: 19  Height: 6\' 4"  (1.93 m)  Weight: 308 lb (139.708 kg)   Body mass index is 37.51 kg/(m^2). Physical Exam   In general this is a pleasant elderly male in no distress lying comfortably in his bed he appears to be at baseline today.  His skin is warm and dry he does have his legs wrapped bilaterally.  Eyes pupils appear reactive light sclera and deferred clear visual acuity appears grossly intact.  Oropharynx is clear mucous membranes moist.  Chest is clear to auscultation with reduced air entry there is no labored breathing.  Heart is regular rate and rhythm without murmur gallop or rub he currently has his legs wrapped continues what appears to be baseline lymphedema.  Abdomen continues to be obese it is soft nontender with positive bowel sounds.  GU he does have a Foley catheter draining amber colored urine at this time I could not really appreciate significant suprapubic tenderness.  Muscle skeletal does move all extremities 4 with continued lower extremity weakness which is baseline I do not see any changes here.  Neurologic is grossly intact his speech is clear no lateralizing findings.  Psyche is oriented to self is pleasant appropriate I would say he has moderate dementia confusion tends to wax and  wane.    Labs reviewed:  Recent Labs  09/28/15 1200 10/24/15 0705 11/14/15 2312  NA 140 143 140  K 4.5 4.0 4.0  CL 106 112* 107  CO2 25 28 27   GLUCOSE 107* 91 105*  BUN 28* 26* 37*  CREATININE 1.02 1.07 1.30*  CALCIUM 9.1 8.7* 8.5*  Recent Labs  10/24/15 0705 11/04/15 0530 11/14/15 2312  AST 15 13* 17  ALT 14* 12* 14*  ALKPHOS 76 65 59  BILITOT 0.3 0.4 0.6  PROT 6.6 6.3* 6.5  ALBUMIN 3.1* 2.9* 3.1*    Recent Labs  10/24/15 0705 11/04/15 0530 11/08/15 0715 11/14/15 2312  WBC 7.6 8.1 7.2 8.7  NEUTROABS 3.9  --  3.2 5.6  HGB 10.9* 10.2* 11.4* 11.2*  HCT 35.2* 32.6* 36.6* 34.7*  MCV 84.8 85.3 85.5 84.6  PLT 237 209 254 267   Lab Results  Component Value Date   TSH 1.960 09/22/2013   Lab Results  Component Value Date   HGBA1C 6.3* 10/24/2015   Lab Results  Component Value Date   CHOL 157 10/06/2012   HDL 34* 10/06/2012   LDLCALC 96 10/06/2012   TRIG 135 10/06/2012   CHOLHDL 4.6 10/06/2012    Significant Diagnostic Results in last 30 days:  No results found.  Assessment/Plan  Fever of unknown origin --at this point he certainly does not appear overtly septic however temperature spike last night certainly cannot be ignored-blood cultures and urine culture are pending-blood work shows a normal white count of 8.7-will await results of chest x-ray as well which was ordered at this point monitor for any changes monitor with vital signs pulse ox every shift for 72 hours.  #2 renal insufficiency creatinine is slightly above his baseline at 1.3 on lab done last night-will update this next week and continue to monitor.  #3 history of COPD continues on duo nebs 3 times a day and when necessary this appears to be relatively stable he is on Advair again will await chest x-ray results.  #4 history of atrial fibrillation he is on Cardizem for rate control as well as Lopressor this appears rate controlled he is on Coumadin we will need to update an INR is it  was 3.22 on June 12-and his Coumadin was reduced to 5 mg a day will need to recheck this today especially if we start an antibiotic for any possible infection we need to know where we stand.  #5 questionable weight gain weight most recently was 309.5 this appears to be on the higher end of his baseline however I see beginning of the month his weight was 307-this seems to vary from the low 300s to about 310 at this point will monitor  Addendum-we have received results the chest x-ray showed possible atelectasis versus infiltrate in the left lower lobe-with patient's respiratory issue in the past as well as the fever last night will be aggressive here and start Augmentin 500 mg twice a day for 7 days as well as a probiotic-since patient is on Coumadin would be hesitant to use a quinolone-again will await blood cultures and monitor clinical status closely again he appears to be basically at baseline today.  Also pending results of INR will have to repeat this as well expediently since he is on on antibiotic  [id number]-of note greater than 40 minutes spent assessing patient-reviewing his chart-discussing his status with nursing staff-reviewing his labs-and coordinating and formulating a plan of care for numerous diagnoses-of note greater than 50% of time spent coordinating plan of care      London Sheer, [place] [phone]

## 2015-11-17 ENCOUNTER — Encounter (HOSPITAL_COMMUNITY): Payer: Self-pay | Admitting: Internal Medicine

## 2015-11-17 DIAGNOSIS — R509 Fever, unspecified: Secondary | ICD-10-CM | POA: Insufficient documentation

## 2015-11-17 DIAGNOSIS — B9620 Unspecified Escherichia coli [E. coli] as the cause of diseases classified elsewhere: Secondary | ICD-10-CM

## 2015-11-17 HISTORY — DX: Urinary tract infection, site not specified: B96.20

## 2015-11-17 LAB — URINE CULTURE

## 2015-11-18 ENCOUNTER — Encounter (HOSPITAL_COMMUNITY)
Admission: RE | Admit: 2015-11-18 | Discharge: 2015-11-18 | Disposition: A | Discharge status: Home / Self Care | Payer: Medicare Other | Source: Skilled Nursing Facility | Attending: Internal Medicine | Admitting: Internal Medicine

## 2015-11-18 DIAGNOSIS — Z7901 Long term (current) use of anticoagulants: Secondary | ICD-10-CM | POA: Diagnosis not present

## 2015-11-18 DIAGNOSIS — H409 Unspecified glaucoma: Secondary | ICD-10-CM | POA: Diagnosis not present

## 2015-11-18 DIAGNOSIS — I5032 Chronic diastolic (congestive) heart failure: Secondary | ICD-10-CM | POA: Diagnosis not present

## 2015-11-18 DIAGNOSIS — I482 Chronic atrial fibrillation: Secondary | ICD-10-CM | POA: Diagnosis not present

## 2015-11-18 DIAGNOSIS — I10 Essential (primary) hypertension: Secondary | ICD-10-CM | POA: Diagnosis not present

## 2015-11-18 LAB — PROTIME-INR
INR: 3.89 — AB (ref 0.00–1.49)
PROTHROMBIN TIME: 37.2 s — AB (ref 11.6–15.2)

## 2015-11-19 ENCOUNTER — Non-Acute Institutional Stay (SKILLED_NURSING_FACILITY): Payer: Medicare Other | Admitting: Internal Medicine

## 2015-11-19 ENCOUNTER — Encounter: Payer: Self-pay | Admitting: Internal Medicine

## 2015-11-19 ENCOUNTER — Encounter (HOSPITAL_COMMUNITY)
Admission: RE | Admit: 2015-11-19 | Discharge: 2015-11-19 | Disposition: A | Discharge status: Home / Self Care | Payer: Medicare Other | Source: Skilled Nursing Facility | Attending: Internal Medicine | Admitting: Internal Medicine

## 2015-11-19 DIAGNOSIS — I4891 Unspecified atrial fibrillation: Secondary | ICD-10-CM

## 2015-11-19 DIAGNOSIS — N289 Disorder of kidney and ureter, unspecified: Secondary | ICD-10-CM | POA: Diagnosis not present

## 2015-11-19 DIAGNOSIS — N390 Urinary tract infection, site not specified: Secondary | ICD-10-CM | POA: Diagnosis not present

## 2015-11-19 DIAGNOSIS — I5032 Chronic diastolic (congestive) heart failure: Secondary | ICD-10-CM | POA: Diagnosis not present

## 2015-11-19 DIAGNOSIS — I482 Chronic atrial fibrillation: Secondary | ICD-10-CM | POA: Diagnosis not present

## 2015-11-19 DIAGNOSIS — R791 Abnormal coagulation profile: Secondary | ICD-10-CM

## 2015-11-19 DIAGNOSIS — I10 Essential (primary) hypertension: Secondary | ICD-10-CM | POA: Diagnosis not present

## 2015-11-19 DIAGNOSIS — H409 Unspecified glaucoma: Secondary | ICD-10-CM | POA: Diagnosis not present

## 2015-11-19 DIAGNOSIS — Z7901 Long term (current) use of anticoagulants: Secondary | ICD-10-CM | POA: Diagnosis not present

## 2015-11-19 LAB — PROTIME-INR
INR: 3.51 — AB (ref 0.00–1.49)
PROTHROMBIN TIME: 34.5 s — AB (ref 11.6–15.2)

## 2015-11-19 NOTE — Progress Notes (Signed)
Location:  [facility] Room Number: 121/W Place of Service:  SNF [phone]) Provider:  Abigail Miyamoto, MD  Patient Care Team: Pecola Lawless, MD as PCP - General (Internal Medicine) [name], MD as Attending Physician (Urology)  Extended Emergency Contact Information Primary Emergency Contact: [name],[name] Address: [address]          [place], [place] [address] [name] of [place] Home Phone: [phone] Mobile Phone: [phone] Relation: Daughter Secondary Emergency Contact: Maddalena,[name] Address: [address]          [place], [place] [address] [name] of [place] Home Phone: [phone] Mobile Phone: [phone] Relation: Son  Code Status:  DNR Goals of care: Advanced Directive information Advanced Directives 11/19/2015  Does patient have an advance directive? Yes  Type of Advance Directive Out of facility DNR (pink MOST or yellow form)  Does patient want to make changes to advanced directive? No - Patient declined  Copy of advanced directive(s) in chart? Yes     Chief Complaint  Patient presents with  . Acute Visit    UTI  Also anticoagulation management with history of elevated INR-  HPI:  Pt is a 82 y.o. male seen today for an acute visit for follow-up of UTI as well as supratherapeutic INR.  Patient had a transitory fever of over 100 last week-blood cultures were obtained which appear to be negative --he also had a chest x-ray which showed possible atelectasis versus pneumonia left lower lobe-he was empirically started on Augmentin for concerns of possible pneumonia-however a urine culture later came back positive for UTI Escherichia coli greater than 100,000 colonies-Dr. Alwyn Ren did switch him to ciprofloxacin her review of sensitivities.  In this regard she appears to be stable he is no longer febrile is not complaining of any increased cough or congestion or signs of pneumonia again the chest x-ray was somewhat ambivalent  about any true pneumonia here-the urine culture did grow out greater than 100,000 colonies of bacteria Escherichia coli and he appears to be tolerating the Cipro well in regards to this he is no longer febrile.  He is on Coumadin with a history-fibrillation he is on Cardizem and Lopressor this appears rate controlled however yesterday INR was significantly elevated at 3.89 his Coumadin was held it is 3.51 today there has been no evidence of increased bruising beyond baseline this will have to be held tonight and recheck tomorrow.  I also spoke with his daughter over the phone today and she was concerned about possibly his eyes looking a bit different today-I did review this with the patient-he is not complaining of any eye irritation pain drainage visual acuity appears to be intact the day but this will have to be watched for any changes.  His vital signs continued to be stable he is afebrile says he feels well today.  He does have a history of a chronic indwelling Foley catheter with a history of obstructive uropathy BPH he is followed by urology-Dr. Alwyn Ren has recommended a urological follow-up secondary to his frequent UTIs   Past Medical History  Diagnosis Date  . Venous insufficiency   . AAA (abdominal aortic aneurysm) (HCC)   . Ulcer   . DVT (deep venous thrombosis) (HCC) 05/04/2013    "LLE"  . Cellulitis   . Nicotine dependence   . Bronchospasm   . Elevated PSA   . BPH (benign prostatic hyperplasia)   . COPD (chronic obstructive pulmonary disease) (HCC)   . Cognitive impairment   .  Daily headache   . Arthritis     "probably on the right leg/hip" (09/20/2013)  . Dementia     "don't know exactly what kind" (09/20/2013)  . Kidney stones   . Glaucoma, both eyes   . Atrial fibrillation (HCC)   . Fall at home September 18, 2013  . Benign localized hyperplasia of prostate with urinary retention 12/23/2014  . AAA (abdominal aortic aneurysm) without rupture (HCC) 10/06/2012  . Anticoagulation  goal of INR 2 to 3 06/01/2013  . B12 deficiency 09/20/2013  . Bladder calculus 12/23/2014    2.6cm and present since at least 2011 when it was about 1.6cm.   . Cardiomyopathy (HCC) 10/15/2014  . CHF (congestive heart failure) (HCC) 09/26/2013  . Chronic venous insufficiency 10/06/2012  . Edema-Bilat Leg and foot 03/07/2013  . Multifactorial dementia 10/06/2012  . Obesity, unspecified 10/06/2012  . Peripheral vascular disease (HCC) 09/20/2012  . Pyelonephritis, acute 12/22/2014  . Weakness 09/18/2013  . Wound cellulitis     let ankle wound size of half dollar per nurse at [facility] on 03/25/2015  . E. coli UTI 11/17/15    Cipro X 7 days   Past Surgical History  Procedure Laterality Date  . Cataract extraction w/ intraocular lens  implant, bilateral Bilateral   . Insitu percutaneous pinning femur Right 1986  . Hip fracture surgery Right 2009  . Back surgery    . Posterior laminectomy / decompression lumbar spine  2004    Decompressive laminectomy unilaterally on the right at L4-5 with  . Femur fracture surgery Right 04/1985  . Cystoscopy w/ stone manipulation  1970's    "once"  . Spine surgery    . Eye surgery    . Cholecystectomy  2011    Gall Bladder  . Cystoscopy with litholapaxy N/A 03/28/2015    Procedure: CYSTOSCOPY WITH LITHOLAPAXY;  Surgeon: Malen Gauze, MD;  Location: [facility];  Service: Urology;  Laterality: N/A;  . Transurethral resection of prostate N/A 03/28/2015    Procedure: TRANSURETHRAL RESECTION OF THE PROSTATE ;  Surgeon: Malen Gauze, MD;  Location: [facility];  Service: Urology;  Laterality: N/A;    Allergies  Allergen Reactions  . Oxycodone Hcl Other (See Comments)     makes pt "wild"  . Propoxyphene N-Acetaminophen Other (See Comments)     Makes pt. "wild"    Current Outpatient Prescriptions on File Prior to Visit  Medication Sig Dispense Refill  . acetaminophen (TYLENOL) 500 MG tablet Take 1,000 mg by mouth every 6 (six) hours as needed for mild  pain or fever.     . Amino Acids-Protein Hydrolys (FEEDING SUPPLEMENT, PRO-STAT SUGAR FREE 64,) LIQD Take 30 mLs by mouth daily.    [name] diltiazem (CARDIZEM CD) 120 MG 24 hr capsule Take 120 mg by mouth every morning.     . divalproex (DEPAKOTE) 250 MG DR tablet Take 250 mg by mouth 2 (two) times daily.    [name] doxazosin (CARDURA) 8 MG tablet Take 8 mg by mouth every morning.     . Fluticasone-Salmeterol (ADVAIR) 250-50 MCG/DOSE AEPB Inhale 1 puff into the lungs 2 (two) times daily.    . Gauze Pads & Dressings (KERLIX BANDAGE ROLL) MISC Wrap RLE with kerlix, coban, change Monday , Wednesday , Friday and prn    . HYDROcodone-acetaminophen (NORCO/VICODIN) 5-325 MG tablet Take one tablet by mouth every 4 hours as needed for moderate pain. Max APAP--3gm/24 hrs from all sources 180 tablet 0  . ipratropium-albuterol (DUONEB)  0.5-2.5 (3) MG/3ML SOLN 3ml via Neb inhalation three times a day for congestion scheduled and 4 times daily as needed for SOB or congestion    . latanoprost (XALATAN) 0.005 % ophthalmic solution Place 1 drop into both eyes at bedtime.     . metoprolol tartrate (LOPRESSOR) 25 MG tablet Take 0.5 tablets (12.5 mg total) by mouth 2 (two) times daily. 30 tablet 0  . Probiotic Product (RISA-BID PROBIOTIC PO) ( acidophilus-I. b-bb-s. Thermophl) [OTC] tablet; 1 billion-250 cell-mg; Special instructions; if stool is loose. twice a day- PRN start date 11/15/15 to end date 11/21/15    . sertraline (ZOLOFT) 25 MG tablet Take three tablets by mouth once daily    . triamcinolone cream (KENALOG) 0.1 % Apply triamcinolone 0.1 % and 1/2 cetaphil cream  to both Rt. and Lt. leg before wrapping on Mon,Wed,Fri.    Jarrett Soho oil (XENADERM) ointment Apply xenaderm ointment to bilateral buttocks and sacrum q shift & prn for prevention every shift     No current facility-administered medications on file prior to visit.    Review of Systems   Limited Krista Blue patient being poor story and provided by  patient and nursing.  In general does not complaining of any fever or chills.  Skin does not complain of increased rashes itching or bruising this a chronic venous stasis changes legs.  Head ears eyes nose mouth and throat does not complaining of any visual changes or sore throat again his daughter did express concern about his eyes looking differently and this will have to be watched.  Respirations not complaining of shortness breath or cough he does not appear at this point be symptomatic of pneumonia.   Cardiac does not complain of chest pain has chronic lower extremity edema lymphedema.  GI is not complaining of abdominal pain nausea vomiting diarrhea constipation.  GU continues with a chronic indwelling Foley catheter is not complaining overtly of dysuria again urine culture is diagnostic of a UTI.  Muscle skeletal continues with lower extremity weakness at baseline does not complain of joint pain.  Neurologic is not complaining of dizziness headache or syncopal-type feelings.  Psych history of dementia he appears to be doing well with supportive care here no overt anxiety or depressive symptoms noted today.    Immunization History  Administered Date(s) Administered  . Influenza,inj,Quad PF,36+ Mos 03/20/2013, 03/23/2014, 02/11/2015  . PPD Test 12/26/2014, 01/09/2015  . Pneumococcal Polysaccharide-23 03/02/2007, 02/11/2015  . Td 12/30/1996   Pertinent  Health Maintenance Due  Topic Date Due  . INFLUENZA VACCINE  12/31/2015  . PNA vac Low Risk Adult (2 of 2 - PCV13) 02/11/2016   Fall Risk  01/04/2015 04/18/2014 08/15/2013  Falls in the past year? Yes Yes Yes  Number falls in past yr: 2 or more 1 2 or more  Injury with Fall? No Yes -  Risk Factor Category  - High Fall Risk High Fall Risk  Risk for fall due to : Impaired balance/gait;Impaired mobility Impaired mobility;Mental status change History of fall(s);Impaired balance/gait;Impaired mobility;Mental status change    Functional Status Survey:      Physical Exam   T--97.6 pulse 67 respirations 20 blood pressure 132/80 O2 sats rate 95% on room air.  In general this is a pleasant elderly male in no distress sitting comfortably in his wheelchair.  Skin is warm and dry has his legs wrapped bilaterally I do not note any increase in bruising from baseline.  Eyes sclera and conjunctiva are clear visual acuity appears  grossly intact pupils are reactive-I do not see any active drainage or exudate at this time but this will have to be watched.  Oropharynx clear mucous membranes moist.  Chest is clear to auscultation with somewhat shallow air entry no labored breathing 6 some rhonchi at the bases but this does not appear to be a real change from his baseline.  Abdomen is obese soft nontender positive bowel sounds.  Heart is regular rate and rhythm without murmur gallop or rub he does have chronic lymphedema at baseline his legs are wrapped.  GU has an indwelling Foley catheter with somewhat dark amber colored urine could not really appreciate significant suprapubic tenderness.  Muscle skeletal has lower extremity weakness at baseline upper extremity strength appears to be preserved.  Neurologic is grossly intact speech is clear I do not see lateralizing findings this appears to be baseline Psyche is oriented to self is pleasant appropriate.      Labs reviewed:  Recent Labs  09/28/15 1200 10/24/15 0705 11/14/15 2312  NA 140 143 140  K 4.5 4.0 4.0  CL 106 112* 107  CO2 25 28 27   GLUCOSE 107* 91 105*  BUN 28* 26* 37*  CREATININE 1.02 1.07 1.30*  CALCIUM 9.1 8.7* 8.5*    Recent Labs  10/24/15 0705 11/04/15 0530 11/14/15 2312  AST 15 13* 17  ALT 14* 12* 14*  ALKPHOS 76 65 59  BILITOT 0.3 0.4 0.6  PROT 6.6 6.3* 6.5  ALBUMIN 3.1* 2.9* 3.1*    Recent Labs  10/24/15 0705 11/04/15 0530 11/08/15 0715 11/14/15 2312  WBC 7.6 8.1 7.2 8.7  NEUTROABS 3.9  --  3.2 5.6  HGB 10.9*  10.2* 11.4* 11.2*  HCT 35.2* 32.6* 36.6* 34.7*  MCV 84.8 85.3 85.5 84.6  PLT 237 209 254 267   Lab Results  Component Value Date   TSH 1.960 09/22/2013   Lab Results  Component Value Date   HGBA1C 6.3* 10/24/2015   Lab Results  Component Value Date   CHOL 157 10/06/2012   HDL 34* 10/06/2012   LDLCALC 96 10/06/2012   TRIG 135 10/06/2012   CHOLHDL 4.6 10/06/2012    Significant Diagnostic Results in last 30 days:  No results found.  Assessment/Plan  #1 UTI-again he has been started on Cipro for Escherichia coli UTI-he is not febrile appears to be stable in this regard at this point monitor.--The Augmentin which was started empirically has been discontinued-he is not really showing signs of pneumonia here with increased cough or congestion-I did discuss this with his daughter via phone as well  Urology consult has been ordered  #2 elevated INR-this will have to be watched closely especially since he is on Cipro-INR still elevated today at 3.51 but coming down will hold Coumadin tonight and have INR rechecked tomorrow.  #3 renal insufficiency-creatinine of 1.3 on June 15 lab is slightly above his baseline at this point will monitor he appears to be relatively stable however in this regards-another metabolic panel has been ordered for tomorrow  #4-eye changes-as discussed above exam today appeared to be relatively baseline but this will have to be watched for any changes in his vision exudate drainage any changes from baseline --currently has no complaints  [id number]        London SheerLuster, Sally C, [place]MA [phone]

## 2015-11-20 ENCOUNTER — Encounter (HOSPITAL_COMMUNITY)
Admission: RE | Admit: 2015-11-20 | Discharge: 2015-11-20 | Disposition: A | Discharge status: Home / Self Care | Payer: Medicare Other | Source: Skilled Nursing Facility | Attending: Internal Medicine | Admitting: Internal Medicine

## 2015-11-20 ENCOUNTER — Ambulatory Visit (INDEPENDENT_AMBULATORY_CARE_PROVIDER_SITE_OTHER): Payer: Medicare Other | Admitting: Urology

## 2015-11-20 DIAGNOSIS — R330 Drug induced retention of urine: Secondary | ICD-10-CM

## 2015-11-20 DIAGNOSIS — I5032 Chronic diastolic (congestive) heart failure: Secondary | ICD-10-CM | POA: Diagnosis not present

## 2015-11-20 DIAGNOSIS — H409 Unspecified glaucoma: Secondary | ICD-10-CM | POA: Diagnosis not present

## 2015-11-20 DIAGNOSIS — I482 Chronic atrial fibrillation: Secondary | ICD-10-CM | POA: Diagnosis not present

## 2015-11-20 DIAGNOSIS — I10 Essential (primary) hypertension: Secondary | ICD-10-CM | POA: Diagnosis not present

## 2015-11-20 DIAGNOSIS — N3000 Acute cystitis without hematuria: Secondary | ICD-10-CM

## 2015-11-20 DIAGNOSIS — Z7901 Long term (current) use of anticoagulants: Secondary | ICD-10-CM | POA: Diagnosis not present

## 2015-11-20 LAB — BASIC METABOLIC PANEL
BUN: 28 mg/dL — AB (ref 6–20)
CHLORIDE: 111 mmol/L (ref 101–111)
CO2: 30 mmol/L (ref 22–32)
CREATININE: 1.21 mg/dL (ref 0.61–1.24)
Calcium: 8.4 mg/dL — ABNORMAL LOW (ref 8.9–10.3)
GFR calc Af Amer: >60 mL/min (ref >60)
GFR calc non Af Amer: 54 mL/min — ABNORMAL LOW (ref >60)
Glucose, Bld: 98 mg/dL (ref 65–99)
Potassium: 4.2 mmol/L (ref 3.5–5.1)
Sodium: 143 mmol/L (ref 135–145)

## 2015-11-20 LAB — CULTURE, BLOOD (SINGLE): Culture: NO GROWTH

## 2015-11-20 LAB — PROTIME-INR
INR: 4.23 — AB (ref 0.00–1.49)
Prothrombin Time: 39.6 seconds — ABNORMAL HIGH (ref 11.6–15.2)

## 2015-11-21 ENCOUNTER — Encounter (HOSPITAL_COMMUNITY)
Admission: RE | Admit: 2015-11-21 | Discharge: 2015-11-21 | Disposition: A | Discharge status: Home / Self Care | Payer: Medicare Other | Source: Skilled Nursing Facility | Attending: Internal Medicine | Admitting: Internal Medicine

## 2015-11-21 DIAGNOSIS — I482 Chronic atrial fibrillation: Secondary | ICD-10-CM | POA: Diagnosis not present

## 2015-11-21 DIAGNOSIS — Z7901 Long term (current) use of anticoagulants: Secondary | ICD-10-CM | POA: Diagnosis not present

## 2015-11-21 DIAGNOSIS — H409 Unspecified glaucoma: Secondary | ICD-10-CM | POA: Diagnosis not present

## 2015-11-21 DIAGNOSIS — I10 Essential (primary) hypertension: Secondary | ICD-10-CM | POA: Diagnosis not present

## 2015-11-21 DIAGNOSIS — I5032 Chronic diastolic (congestive) heart failure: Secondary | ICD-10-CM | POA: Diagnosis not present

## 2015-11-21 LAB — PROTIME-INR
INR: 2.53 — AB (ref 0.00–1.49)
PROTHROMBIN TIME: 26.9 s — AB (ref 11.6–15.2)

## 2015-11-22 ENCOUNTER — Encounter (HOSPITAL_COMMUNITY)
Admission: RE | Admit: 2015-11-22 | Discharge: 2015-11-22 | Disposition: A | Discharge status: Home / Self Care | Payer: Medicare Other | Source: Skilled Nursing Facility | Attending: Internal Medicine | Admitting: Internal Medicine

## 2015-11-22 ENCOUNTER — Non-Acute Institutional Stay (SKILLED_NURSING_FACILITY): Payer: Medicare Other | Admitting: Internal Medicine

## 2015-11-22 DIAGNOSIS — R791 Abnormal coagulation profile: Secondary | ICD-10-CM

## 2015-11-22 DIAGNOSIS — N10 Acute tubulo-interstitial nephritis: Secondary | ICD-10-CM | POA: Diagnosis not present

## 2015-11-22 DIAGNOSIS — I4891 Unspecified atrial fibrillation: Secondary | ICD-10-CM | POA: Diagnosis not present

## 2015-11-22 DIAGNOSIS — H409 Unspecified glaucoma: Secondary | ICD-10-CM | POA: Diagnosis not present

## 2015-11-22 DIAGNOSIS — Z7901 Long term (current) use of anticoagulants: Secondary | ICD-10-CM | POA: Diagnosis not present

## 2015-11-22 DIAGNOSIS — I10 Essential (primary) hypertension: Secondary | ICD-10-CM | POA: Diagnosis not present

## 2015-11-22 DIAGNOSIS — I482 Chronic atrial fibrillation: Secondary | ICD-10-CM | POA: Diagnosis not present

## 2015-11-22 DIAGNOSIS — I5032 Chronic diastolic (congestive) heart failure: Secondary | ICD-10-CM | POA: Diagnosis not present

## 2015-11-22 LAB — PROTIME-INR
INR: 1.94 — ABNORMAL HIGH (ref 0.00–1.49)
Prothrombin Time: 22.1 seconds — ABNORMAL HIGH (ref 11.6–15.2)

## 2015-11-22 NOTE — Progress Notes (Signed)
Patient ID: [name], male   DOB: 11-Nov-1931, 82 y.o.   MRN: [id number]  Location:      Place of Service:  SNF (31) Provider:  Abigail Miyamoto, MD  Patient Care Team: Pecola Lawless, MD as PCP - General (Internal Medicine) [name], MD as Attending Physician (Urology)  Extended Emergency Contact Information Primary Emergency Contact: [name],[name] Address: [address]          [place], [place] [address] [name] of [place] Home Phone: [phone] Mobile Phone: [phone] Relation: Daughter Secondary Emergency Contact: Cercone,[name] Address: [address]          [place], [place] [address] [name] of [place] Home Phone: [phone] Mobile Phone: [phone] Relation: Son  Code Status:  DNR Goals of care: Advanced Directive information Advanced Directives 11/19/2015  Does patient have an advance directive? Yes  Type of Advance Directive Out of facility DNR (pink MOST or yellow form)  Does patient want to make changes to advanced directive? No - Patient declined  Copy of advanced directive(s) in chart? Yes     Chief Complaint  Patient presents with  . Acute Visit  Also anticoagulation management with history of elevated INR-Now minimally subtherapeutic at 1.94  HPI:  Pt is a 82 y.o. male seen today for anticoagulation management with a history of-fibrillation-currently on Cipro with a history of UTI Escherichia coli which will be completed on June 25  Patient had a transitory fever of over 100 last week-blood cultures were obtained which appear to be negative --he also had a chest x-ray which showed possible atelectasis versus pneumonia left lower lobe-he was empirically started on Augmentin for concerns of possible pneumonia-however a urine culture later came back positive for UTI Escherichia coli greater than 100,000 colonies-Dr. Alwyn Ren did switch him to ciprofloxacin her review of sensitivities.  In this regard  appears to be stable he is no  longer febrile is not complaining of any increased cough or congestion or signs of pneumonia again the chest x-ray was somewhat ambivalent about any true pneumonia here-the urine culture did grow out greater than 100,000 colonies of bacteria Escherichia coli and he appears to be tolerating the Cipro well in regards to this he is no longer febrile.  He is on Coumadin with a history-fibrillation he is on Cardizem  this appears rate controlled however  His INR has been somewhat challenging. He did rise to above 3 earlier this week-and actually  rose up to 4.23  2 days ago-his Coumadin was held during all this-INR did normalize at 2.53 yesterday and he was started on a lower dose of 4 mg a day of Coumadin-subsequent INR today is 1.94      Past Medical History  Diagnosis Date  . Venous insufficiency   . AAA (abdominal aortic aneurysm) (HCC)   . Ulcer   . DVT (deep venous thrombosis) (HCC) 05/04/2013    "LLE"  . Cellulitis   . Nicotine dependence   . Bronchospasm   . Elevated PSA   . BPH (benign prostatic hyperplasia)   . COPD (chronic obstructive pulmonary disease) (HCC)   . Cognitive impairment   . Daily headache   . Arthritis     "probably on the right leg/hip" (09/20/2013)  . Dementia     "don't know exactly what kind" (09/20/2013)  . Kidney stones   . Glaucoma, both eyes   . Atrial fibrillation (HCC)   . Fall at home September 18, 2013  . Benign localized hyperplasia  of prostate with urinary retention 12/23/2014  . AAA (abdominal aortic aneurysm) without rupture (HCC) 10/06/2012  . Anticoagulation goal of INR 2 to 3 06/01/2013  . B12 deficiency 09/20/2013  . Bladder calculus 12/23/2014    2.6cm and present since at least 2011 when it was about 1.6cm.   . Cardiomyopathy (HCC) 10/15/2014  . CHF (congestive heart failure) (HCC) 09/26/2013  . Chronic venous insufficiency 10/06/2012  . Edema-Bilat Leg and foot 03/07/2013  . Multifactorial dementia 10/06/2012  . Obesity, unspecified 10/06/2012  .  Peripheral vascular disease (HCC) 09/20/2012  . Pyelonephritis, acute 12/22/2014  . Weakness 09/18/2013  . Wound cellulitis     let ankle wound size of half dollar per nurse at [facility] on 03/25/2015  . E. coli UTI 11/17/15    Cipro X 7 days   Past Surgical History  Procedure Laterality Date  . Cataract extraction w/ intraocular lens  implant, bilateral Bilateral   . Insitu percutaneous pinning femur Right 1986  . Hip fracture surgery Right 2009  . Back surgery    . Posterior laminectomy / decompression lumbar spine  2004    Decompressive laminectomy unilaterally on the right at L4-5 with  . Femur fracture surgery Right 04/1985  . Cystoscopy w/ stone manipulation  1970's    "once"  . Spine surgery    . Eye surgery    . Cholecystectomy  2011    Gall Bladder  . Cystoscopy with litholapaxy N/A 03/28/2015    Procedure: CYSTOSCOPY WITH LITHOLAPAXY;  Surgeon: Malen Gauze, MD;  Location: [facility];  Service: Urology;  Laterality: N/A;  . Transurethral resection of prostate N/A 03/28/2015    Procedure: TRANSURETHRAL RESECTION OF THE PROSTATE ;  Surgeon: Malen Gauze, MD;  Location: [facility];  Service: Urology;  Laterality: N/A;    Allergies  Allergen Reactions  . Oxycodone Hcl Other (See Comments)     makes pt "wild"  . Propoxyphene N-Acetaminophen Other (See Comments)     Makes pt. "wild"    Current Outpatient Prescriptions on File Prior to Visit  Medication Sig Dispense Refill  . acetaminophen (TYLENOL) 500 MG tablet Take 1,000 mg by mouth every 6 (six) hours as needed for mild pain or fever.     . Amino Acids-Protein Hydrolys (FEEDING SUPPLEMENT, PRO-STAT SUGAR FREE 64,) LIQD Take 30 mLs by mouth daily.    . ciprofloxacin (CIPRO) 500 MG tablet Take 500 mg by mouth 2 (two) times daily. Start date 11/18/15 End date 11/24/15    . diltiazem (CARDIZEM CD) 120 MG 24 hr capsule Take 120 mg by mouth every morning.     . divalproex (DEPAKOTE) 250 MG DR tablet Take 250 mg by  mouth 2 (two) times daily.    [name] doxazosin (CARDURA) 8 MG tablet Take 8 mg by mouth every morning.     . Fluticasone-Salmeterol (ADVAIR) 250-50 MCG/DOSE AEPB Inhale 1 puff into the lungs 2 (two) times daily.    . Gauze Pads & Dressings (KERLIX BANDAGE ROLL) MISC Wrap RLE with kerlix, coban, change Monday , Wednesday , Friday and prn    . HYDROcodone-acetaminophen (NORCO/VICODIN) 5-325 MG tablet Take one tablet by mouth every 4 hours as needed for moderate pain. Max APAP--3gm/24 hrs from all sources 180 tablet 0  . ipratropium-albuterol (DUONEB) 0.5-2.5 (3) MG/3ML SOLN 3ml via Neb inhalation three times a day for congestion scheduled and 4 times daily as needed for SOB or congestion    . latanoprost (XALATAN) 0.005 % ophthalmic solution  Place 1 drop into both eyes at bedtime.     . metoprolol tartrate (LOPRESSOR) 25 MG tablet Take 0.5 tablets (12.5 mg total) by mouth 2 (two) times daily. 30 tablet 0  . Probiotic Product (RISA-BID PROBIOTIC PO) ( acidophilus-I. b-bb-s. Thermophl) [OTC] tablet; 1 billion-250 cell-mg; Special instructions; if stool is loose. twice a day- PRN start date 11/15/15 to end date 11/21/15    . sertraline (ZOLOFT) 25 MG tablet Take three tablets by mouth once daily    . triamcinolone cream (KENALOG) 0.1 % Apply triamcinolone 0.1 % and 1/2 cetaphil cream  to both Rt. and Lt. leg before wrapping on Mon,Wed,Fri.    Jarrett Soho. trypsin-balsam-castor oil (XENADERM) ointment Apply xenaderm ointment to bilateral buttocks and sacrum q shift & prn for prevention every shift     No current facility-administered medications on file prior to visit.    Review of Systems   Limited -Secondary to dementia provided by patient and nursing staff  In general does not complaining of any fever or chills.  Skin does not complain of increased rashes itching or bruising --hasa chronic venous stasis changes legs.   [name].  Respirations not complaining of shortness breath or cough he does not appear at this  point be symptomatic of pneumonia.   Cardiac does not complain of chest pain has chronic lower extremity edema lymphedema.  GI is not complaining of abdominal pain nausea vomiting diarrhea constipation.  GU continues with a chronic indwelling Foley catheter is not complaining overtly of dysuria again urine culture is diagnostic of a UTI.  Muscle skeletal continues with lower extremity weakness at baseline does not complain of joint pain.     Psych history of dementia he appears to be doing well with supportive care here no overt anxiety or depressive symptoms noted today.    Immunization History  Administered Date(s) Administered  . Influenza,inj,Quad PF,36+ Mos 03/20/2013, 03/23/2014, 02/11/2015  . PPD Test 12/26/2014, 01/09/2015  . Pneumococcal Polysaccharide-23 03/02/2007, 02/11/2015  . Td 12/30/1996   Pertinent  Health Maintenance Due  Topic Date Due  . INFLUENZA VACCINE  12/31/2015  . PNA vac Low Risk Adult (2 of 2 - PCV13) 02/11/2016   Fall Risk  01/04/2015 04/18/2014 08/15/2013  Falls in the past year? Yes Yes Yes  Number falls in past yr: 2 or more 1 2 or more  Injury with Fall? No Yes -  Risk Factor Category  - High Fall Risk High Fall Risk  Risk for fall due to : Impaired balance/gait;Impaired mobility Impaired mobility;Mental status change History of fall(s);Impaired balance/gait;Impaired mobility;Mental status change   Functional Status Survey:      Physical Exam  Temperature 98.3 pulse 66 respirations 18 blood pressure 124/61  In general this is a pleasant elderly male in no distress sitting comfortably in his wheelchair.  Skin is warm and dry has his legs wrapped bilaterally I do not note any increase in bruising from baseline.  Eyes sclera and conjunctiva are clear visual acuity appears grossly intact pupils are reactive-  Oropharynx clear mucous membranes moist.  Chest is clear to auscultation with somewhat shallow air entry no labored breathing    Abdomen is obese soft nontender positive bowel sounds.  Heart is regular rate and rhythm without murmur gallop or rub he does have chronic lymphedema at baseline his legs are wrapped.  GU has an indwelling Foley catheter with somewhat dark amber colored urine could not really appreciate significant suprapubic tenderness.  Muscle skeletal has lower extremity weakness at  baseline upper extremity strength appears to be preserved.  Neurologic is grossly intact speech is clear I do not see lateralizing findings this appears to be baseline   Psyche is oriented to self is pleasant appropriate.      Labs reviewed:  Recent Labs  10/24/15 0705 11/14/15 2312 11/20/15 0710  NA 143 140 143  K 4.0 4.0 4.2  CL 112* 107 111  CO2 28 27 30   GLUCOSE 91 105* 98  BUN 26* 37* 28*  CREATININE 1.07 1.30* 1.21  CALCIUM 8.7* 8.5* 8.4*    Recent Labs  10/24/15 0705 11/04/15 0530 11/14/15 2312  AST 15 13* 17  ALT 14* 12* 14*  ALKPHOS 76 65 59  BILITOT 0.3 0.4 0.6  PROT 6.6 6.3* 6.5  ALBUMIN 3.1* 2.9* 3.1*    Recent Labs  10/24/15 0705 11/04/15 0530 11/08/15 0715 11/14/15 2312  WBC 7.6 8.1 7.2 8.7  NEUTROABS 3.9  --  3.2 5.6  HGB 10.9* 10.2* 11.4* 11.2*  HCT 35.2* 32.6* 36.6* 34.7*  MCV 84.8 85.3 85.5 84.6  PLT 237 209 254 267   Lab Results  Component Value Date   TSH 1.960 09/22/2013   Lab Results  Component Value Date   HGBA1C 6.3* 10/24/2015   Lab Results  Component Value Date   CHOL 157 10/06/2012   HDL 34* 10/06/2012   LDLCALC 96 10/06/2012   TRIG 135 10/06/2012   CHOLHDL 4.6 10/06/2012    Significant Diagnostic Results in last 30 days:  No results found.  Assessment/Plan  #1-history of A. fib on chronic anticoagulation with recent supratherapeutic INR-INR is 1.9 4 today--however he has had an significant elevated INR on higher doses previously has just been restarted on Coumadin  as of yesterday --would be hesitant to be aggressive here since his Coumadin  has been held for a while and I suspect it will take a a few more  days at current dose to get a true value of how he will do on 4 mg-will continue the 4 mg a day and recheck this first laboratory day next week which would be Monday, June 26  Currently rate is controlled on  #2UTI-again he is completing a course of Cipro for Escherichia coli UTI-he is not febrile continues  to be stable in this regard at this point monitor.--The Augmentin which was started empirically has been discontinued-he continues to not really showigns of pneumonia here with increased cough or congestion-  Urology consult has been ordered     [id number]        Edmon CrapeArlo Emmarie Sannes, PA-C [phone]

## 2015-11-23 ENCOUNTER — Encounter: Payer: Self-pay | Admitting: Internal Medicine

## 2015-11-25 ENCOUNTER — Encounter (HOSPITAL_COMMUNITY)
Admission: RE | Admit: 2015-11-25 | Discharge: 2015-11-25 | Disposition: A | Discharge status: Home / Self Care | Payer: Medicare Other | Source: Skilled Nursing Facility | Attending: Internal Medicine | Admitting: Internal Medicine

## 2015-11-25 DIAGNOSIS — Z7901 Long term (current) use of anticoagulants: Secondary | ICD-10-CM | POA: Diagnosis not present

## 2015-11-25 DIAGNOSIS — H409 Unspecified glaucoma: Secondary | ICD-10-CM | POA: Diagnosis not present

## 2015-11-25 DIAGNOSIS — I10 Essential (primary) hypertension: Secondary | ICD-10-CM | POA: Diagnosis not present

## 2015-11-25 DIAGNOSIS — I482 Chronic atrial fibrillation: Secondary | ICD-10-CM | POA: Diagnosis not present

## 2015-11-25 DIAGNOSIS — I5032 Chronic diastolic (congestive) heart failure: Secondary | ICD-10-CM | POA: Diagnosis not present

## 2015-11-25 LAB — PROTIME-INR
INR: 1.91 — AB (ref 0.00–1.49)
PROTHROMBIN TIME: 21.8 s — AB (ref 11.6–15.2)

## 2015-11-27 ENCOUNTER — Non-Acute Institutional Stay (SKILLED_NURSING_FACILITY): Payer: Medicare Other | Admitting: Internal Medicine

## 2015-11-27 ENCOUNTER — Encounter: Payer: Self-pay | Admitting: Internal Medicine

## 2015-11-27 ENCOUNTER — Encounter (HOSPITAL_COMMUNITY)
Admission: RE | Admit: 2015-11-27 | Discharge: 2015-11-27 | Disposition: A | Discharge status: Home / Self Care | Payer: Medicare Other | Source: Skilled Nursing Facility | Attending: Internal Medicine | Admitting: Internal Medicine

## 2015-11-27 DIAGNOSIS — I420 Dilated cardiomyopathy: Secondary | ICD-10-CM

## 2015-11-27 DIAGNOSIS — I4891 Unspecified atrial fibrillation: Secondary | ICD-10-CM | POA: Diagnosis not present

## 2015-11-27 DIAGNOSIS — R635 Abnormal weight gain: Secondary | ICD-10-CM

## 2015-11-27 DIAGNOSIS — Z7901 Long term (current) use of anticoagulants: Secondary | ICD-10-CM

## 2015-11-27 DIAGNOSIS — H409 Unspecified glaucoma: Secondary | ICD-10-CM | POA: Diagnosis not present

## 2015-11-27 DIAGNOSIS — I482 Chronic atrial fibrillation: Secondary | ICD-10-CM | POA: Diagnosis not present

## 2015-11-27 DIAGNOSIS — I5032 Chronic diastolic (congestive) heart failure: Secondary | ICD-10-CM | POA: Diagnosis not present

## 2015-11-27 DIAGNOSIS — I10 Essential (primary) hypertension: Secondary | ICD-10-CM | POA: Diagnosis not present

## 2015-11-27 LAB — PROTIME-INR
INR: 1.78 — ABNORMAL HIGH (ref 0.00–1.49)
Prothrombin Time: 20.7 seconds — ABNORMAL HIGH (ref 11.6–15.2)

## 2015-11-27 NOTE — Progress Notes (Signed)
Location:   [facility] Room Number: 121/W Place of Service:  SNF (31) Provider:  Abigail MiyamotoArlo Milfred Mays  Joel Hopper, MD  Patient Care Team: Pecola LawlessWilliam F Hopper, MD as PCP - General (Internal Medicine) Joel PippinJohn Wrenn, MD as Attending Physician (Urology)  Extended Emergency Contact Information Primary Emergency Contact: [name] Address: [address]          [place], [place] [address] [place] Home Phone: [phone] Mobile Phone: [phone] Relation: Daughter Secondary Emergency Contact: [name] Address: [address]          [place], [place] [address] [place] Home Phone: [phone] Mobile Phone: [phone] Relation: Son  Code Status:  DNR Goals of care: Advanced Directive information Advanced Directives 11/27/2015  Does patient have an advance directive? Yes  Type of Advance Directive Out of facility DNR (pink MOST or yellow form)  Does patient want to make changes to advanced directive? No - Patient declined  Copy of advanced directive(s) in chart? Yes     Chief Complaint  Patient presents with  . Acute Visit    Wt gain, Anticoagulation    HPI:  Pt is a 82 y.o. male seen today for an acute visit for Reported weight gain-also anticoagulation management with history of atrial fibrillation on chronic Coumadin.   Anticoagulation this is been somewhat challenging he has had an elevated INR that peaked at over 4 at one point recently he was on Cipro for UTI he has completed ciprofloxacin-Coumadin was held and restarted when INR returned down to 2.53 he was started on 4 mg l and INR is have been 1.94-1.91-he did receive written 4.5 mg dose Monday night and continued on 4 mg update INR today shows INR has dropped a bit 1.78. He has not increased bruising or bleeding.  Nursing staff has also noted increased weight gain-most recently 312.5 appears baseline is  300-310 range recently with some variation-per code done in May 2016 ejection  fraction was 5560% with grade 1 diastolic dysfunction he does have a listed diagnosis of dilated cardiomyopathy.  At times he has been on a diuretic secondary to weight gain edema.  Nursing reports he does appear to have a see some mildly increased edema he does have baseline fairly significant lymphedema his legs are wrapped.  He does not complain of any increased shortness of breath.  He did have a chest x-ray earlier this month which showed atelectasis versus small infiltrate in the left lower lobe.  He has again completed a course of Cipro for Escherichia coli UTI he was initially started Augmentin secondary to concerns of an infiltrate but when shearing culture came back positive for Escherichia coli with switched to Cipro and has tolerated this well.  He currently does not report any respiratory issues.        Past Medical History  Diagnosis Date  . Venous insufficiency   . AAA (abdominal aortic aneurysm) (HCC)   . Ulcer   . DVT (deep venous thrombosis) (HCC) 05/04/2013    "LLE"  . Cellulitis   . Nicotine dependence   . Bronchospasm   . Elevated PSA   . BPH (benign prostatic hyperplasia)   . COPD (chronic obstructive pulmonary disease) (HCC)   . Cognitive impairment   . Daily headache   . Arthritis     "probably on the right leg/hip" (09/20/2013)  . Dementia     "don't know exactly what kind" (09/20/2013)  . Kidney stones   . Glaucoma, both eyes   .  Atrial fibrillation (HCC)   . Fall at home September 18, 2013  . Benign localized hyperplasia of prostate with urinary retention 12/23/2014  . AAA (abdominal aortic aneurysm) without rupture (HCC) 10/06/2012  . Anticoagulation goal of INR 2 to 3 06/01/2013  . B12 deficiency 09/20/2013  . Bladder calculus 12/23/2014    2.6cm and present since at least 2011 when it was about 1.6cm.   . Cardiomyopathy (HCC) 10/15/2014  . CHF (congestive heart failure) (HCC) 09/26/2013  . Chronic venous insufficiency 10/06/2012  . Edema-Bilat Leg and  foot 03/07/2013  . Multifactorial dementia 10/06/2012  . Obesity, unspecified 10/06/2012  . Peripheral vascular disease (HCC) 09/20/2012  . Pyelonephritis, acute 12/22/2014  . Weakness 09/18/2013  . Wound cellulitis     let ankle wound size of half dollar per nurse at [facility] on 03/25/2015  . E. coli UTI 11/17/15    Cipro X 7 days   Past Surgical History  Procedure Laterality Date  . Cataract extraction w/ intraocular lens  implant, bilateral Bilateral   . Insitu percutaneous pinning femur Right 1986  . Hip fracture surgery Right 2009  . Back surgery    . Posterior laminectomy / decompression lumbar spine  2004    Decompressive laminectomy unilaterally on the right at L4-5 with  . Femur fracture surgery Right 04/1985  . Cystoscopy w/ stone manipulation  1970's    "once"  . Spine surgery    . Eye surgery    . Cholecystectomy  2011    Gall Bladder  . Cystoscopy with litholapaxy N/A 03/28/2015    Procedure: CYSTOSCOPY WITH LITHOLAPAXY;  Surgeon: Malen Gauze, MD;  Location: [facility];  Service: Urology;  Laterality: N/A;  . Transurethral resection of prostate N/A 03/28/2015    Procedure: TRANSURETHRAL RESECTION OF THE PROSTATE ;  Surgeon: Malen Gauze, MD;  Location: [facility];  Service: Urology;  Laterality: N/A;    Allergies  Allergen Reactions  . Oxycodone Hcl Other (See Comments)     makes pt "wild"  . Propoxyphene N-Acetaminophen Other (See Comments)     Makes pt. "wild"      Current Outpatient Prescriptions on File Prior to Visit  Medication Sig Dispense Refill  . acetaminophen (TYLENOL) 500 MG tablet Take 1,000 mg by mouth every 6 (six) hours as needed for mild pain or fever.     . Amino Acids-Protein Hydrolys (FEEDING SUPPLEMENT, PRO-STAT SUGAR FREE 64,) LIQD Take 30 mLs by mouth 2 (two) times daily between meals.     [name] diltiazem (CARDIZEM CD) 120 MG 24 hr capsule Take 120 mg by mouth every morning.     . divalproex (DEPAKOTE) 250 MG DR tablet Take 250 mg  by mouth 2 (two) times daily.    [name] doxazosin (CARDURA) 8 MG tablet Take 8 mg by mouth every morning.     . Fluticasone-Salmeterol (ADVAIR) 250-50 MCG/DOSE AEPB Inhale 1 puff into the lungs 2 (two) times daily.    . Gauze Pads & Dressings (KERLIX BANDAGE ROLL) MISC Wrap RLE with kerlix, coban, change Monday , Wednesday , Friday and prn    . HYDROcodone-acetaminophen (NORCO/VICODIN) 5-325 MG tablet Take one tablet by mouth every 4 hours as needed for moderate pain. Max APAP--3gm/24 hrs from all sources 180 tablet 0  . ipratropium-albuterol (DUONEB) 0.5-2.5 (3) MG/3ML SOLN 3ml via Neb inhalation three times a day for congestion scheduled and 4 times daily as needed for SOB or congestion    . latanoprost (XALATAN) 0.005 % ophthalmic  solution Place 1 drop into both eyes at bedtime.     . metoprolol tartrate (LOPRESSOR) 25 MG tablet Take 0.5 tablets (12.5 mg total) by mouth 2 (two) times daily. 30 tablet 0  . sertraline (ZOLOFT) 25 MG tablet Take three tablets by mouth once daily    . triamcinolone cream (KENALOG) 0.1 % Apply triamcinolone 0.1 % and 1/2 cetaphil cream  to both Rt. and Lt. leg before wrapping on Mon,Wed,Fri.    Jarrett Soho. trypsin-balsam-castor oil (XENADERM) ointment Apply xenaderm ointment to bilateral buttocks and sacrum q shift & prn for prevention every shift    . warfarin (COUMADIN) 4 MG tablet Administer 4 mg along with 1/2 tab to equal 4.5 mg on Mon,Wed at 5:00 PM     No current facility-administered medications on file prior to visit.      Review of Systems  In general is not complaining of any fever or chills appears does had some weight gain.  Skin does not complain of rashes or itching.  Does have chronic venous stasis changes lower extremities his legs currently are wrapped.  Ears eyes nose mouth throat does not complain of sore throat or visual changes.  Respiratory does not complain of shortness breath or cough.  Cardiac denies chest pain does again have significant  lymphedema edema.  GI does not complain of nausea vomiting diarrhea constipation or abdominal discomfort.  GU does have a chronic indwelling Foley catheter with history of urinary obstruction BPH.  Does not complain of dysuria.  Muscle skeletal has lower extremity weakness but does not complain of pain currently.  Neurologic is not complaining of dizziness headache or syncopal-type feelings.   Psych does have some cognitive deficits but appears to be doing well with supportive care at times will have behaviors but these have stabilized it appears recently    Immunization History  Administered Date(s) Administered  . Influenza,inj,Quad PF,36+ Mos 03/20/2013, 03/23/2014, 02/11/2015  . PPD Test 12/26/2014, 01/09/2015  . Pneumococcal Polysaccharide-23 03/02/2007, 02/11/2015  . Td 12/30/1996   Pertinent  Health Maintenance Due  Topic Date Due  . INFLUENZA VACCINE  12/31/2015  . PNA vac Low Risk Adult (2 of 2 - PCV13) 02/11/2016   Fall Risk  01/04/2015 04/18/2014 08/15/2013  Falls in the past year? Yes Yes Yes  Number falls in past yr: 2 or more 1 2 or more  Injury with Fall? No Yes -  Risk Factor Category  - High Fall Risk High Fall Risk  Risk for fall due to : Impaired balance/gait;Impaired mobility Impaired mobility;Mental status change History of fall(s);Impaired balance/gait;Impaired mobility;Mental status change   Functional Status Survey:    Filed Vitals:   11/27/15 1035  BP: 136/80  Pulse: 64  Temp: 98.4 F (36.9 C)  TempSrc: Oral  Resp: 21  Height: 6\' 4"  (1.93 m)  Weight: 308 lb (139.708 kg)  SpO2: 96%   Body mass index is 37.51 kg/(m^2). Physical Exam   In general this is a pleasant elderly male in no distress lying comfortably in bed.  His skin is warm and dry continues with wrapping of his lower legs bilaterally. He appears to have a small skin tag on his left thumb he would like removed  Eyes pupils appear reactive to light sclerae and conjunctivae were  clear visual acuity appears grossly intact.  Oropharynx clear mucous membranes moist.  Chest is largely clear to auscultation there is some minimal bronchial sounds on the lower right side no labored breathing.  Abdomen is obese soft  nontender positive bowel sounds.  GU has a chronic indwelling Foley catheter draining amber colored urine.  Muscle skeletal does have lower extremity weakness this is baseline moves upper extremities with baseline strength.  Neurologic is grossly intact to speech is clear no lateralizing findings.  Psyche is oriented to self pleasant appropriate he does have confusion dementia I would classify as moderate  Labs reviewed:  Recent Labs  10/24/15 0705 11/14/15 2312 11/20/15 0710  NA 143 140 143  K 4.0 4.0 4.2  CL 112* 107 111  CO2 28 27 30   GLUCOSE 91 105* 98  BUN 26* 37* 28*  CREATININE 1.07 1.30* 1.21  CALCIUM 8.7* 8.5* 8.4*    Recent Labs  10/24/15 0705 11/04/15 0530 11/14/15 2312  AST 15 13* 17  ALT 14* 12* 14*  ALKPHOS 76 65 59  BILITOT 0.3 0.4 0.6  PROT 6.6 6.3* 6.5  ALBUMIN 3.1* 2.9* 3.1*    Recent Labs  10/24/15 0705 11/04/15 0530 11/08/15 0715 11/14/15 2312  WBC 7.6 8.1 7.2 8.7  NEUTROABS 3.9  --  3.2 5.6  HGB 10.9* 10.2* 11.4* 11.2*  HCT 35.2* 32.6* 36.6* 34.7*  MCV 84.8 85.3 85.5 84.6  PLT 237 209 254 267   Lab Results  Component Value Date   TSH 1.960 09/22/2013   Lab Results  Component Value Date   HGBA1C 6.3* 10/24/2015   Lab Results  Component Value Date   CHOL 157 10/06/2012   HDL 34* 10/06/2012   LDLCALC 96 10/06/2012   TRIG 135 10/06/2012   CHOLHDL 4.6 10/06/2012    Significant Diagnostic Results in last 30 days:  No results found.  Assessment/Plan  #1 weight gain-per nursing staff appears she does have possibly slightly more edema-although there is variability in his weights-we will give him a dose of Lasix 40 mg today and 20 mg tomorrow with potassium when he milliequivalents today and 10  mEq tomorrow-monitor weights daily notify provider of any continued weight gain-also will update a metabolic panel on Friday tto ensure stability of renal function and electrolytes-clinically he appears to be stable he does again have a history of diastolic CHF and dilated cardiomyopathy has had times received a diuretic short-term.  #2 anticoagulation management with history of A. fib-this appears rate controlled on Cardizem-INR has dropped somewhat-currently on 4 mg a day will increase this to 5 mg 1 dose and then up to 4-1/2 mg starting tomorrow night will check an INR on Friday to see where we stand-he is no longer on Cipro which I suspect elevated his INR previously.  [id number]     London Sheer, CMA [phone]

## 2015-11-29 ENCOUNTER — Non-Acute Institutional Stay (SKILLED_NURSING_FACILITY): Payer: Medicare Other | Admitting: Internal Medicine

## 2015-11-29 ENCOUNTER — Encounter (HOSPITAL_COMMUNITY)
Admission: RE | Admit: 2015-11-29 | Discharge: 2015-11-29 | Disposition: A | Discharge status: Home / Self Care | Payer: Medicare Other | Source: Skilled Nursing Facility | Attending: Internal Medicine | Admitting: Internal Medicine

## 2015-11-29 DIAGNOSIS — Z7901 Long term (current) use of anticoagulants: Secondary | ICD-10-CM

## 2015-11-29 DIAGNOSIS — L989 Disorder of the skin and subcutaneous tissue, unspecified: Secondary | ICD-10-CM

## 2015-11-29 DIAGNOSIS — I10 Essential (primary) hypertension: Secondary | ICD-10-CM | POA: Diagnosis not present

## 2015-11-29 DIAGNOSIS — H409 Unspecified glaucoma: Secondary | ICD-10-CM | POA: Diagnosis not present

## 2015-11-29 DIAGNOSIS — I482 Chronic atrial fibrillation: Secondary | ICD-10-CM | POA: Diagnosis not present

## 2015-11-29 DIAGNOSIS — I5032 Chronic diastolic (congestive) heart failure: Secondary | ICD-10-CM | POA: Diagnosis not present

## 2015-11-29 LAB — BASIC METABOLIC PANEL
Anion gap: 5 (ref 5–15)
BUN: 23 mg/dL — AB (ref 6–20)
CO2: 27 mmol/L (ref 22–32)
CREATININE: 1.11 mg/dL (ref 0.61–1.24)
Calcium: 8.3 mg/dL — ABNORMAL LOW (ref 8.9–10.3)
Chloride: 109 mmol/L (ref 101–111)
GFR, EST NON AFRICAN AMERICAN: 60 mL/min — AB (ref >60)
Glucose, Bld: 90 mg/dL (ref 65–99)
Potassium: 4.1 mmol/L (ref 3.5–5.1)
SODIUM: 141 mmol/L (ref 135–145)

## 2015-11-29 LAB — PROTIME-INR
INR: 1.84 — AB (ref 0.00–1.49)
Prothrombin Time: 21.2 seconds — ABNORMAL HIGH (ref 11.6–15.2)

## 2015-11-29 NOTE — Progress Notes (Signed)
Patient ID: [name], male   DOB: 04-01-1932, 82 y.o.   MRN: [id number]   This is an acute visit.  Level care skilled.  Facility [facility].  Chief complaint-acute visit secondary left thumb skin tag-also anticoagulation management with history of A. fib on chronic Coumadin.  History of present illness.  Patient is a pleasant 82 year old male seen today for concerns of a continued but skin tag lesion on his left lower thumb.  Patient says it does irritate him and would like it removed-he is asking to have it cut off however it appears in the past this has been addressed by dermatology and frozen off.  There is no sign of infection here.  He is also on chronic Coumadin with a history of atrial fibrillation as well as previous history of DVT-INR slightly subtherapeutic today at 1.84 this is gradually going up-he has at times become supratherapeutic-recently was on Cipro and INR did go up to over 4.  We've been gradually titrating his Coumadin up to get to a therapeutic level-most recently received four an a  half milligrams. Last night he did receive 5 mg night before.   Family medical social history reviewed per previous progress note 11/27/2015.  Medications have been reviewed per MAR.  Review of systems-patient does not report any increase shortness of breath bruising bleeding he is concerned again about his left thumb lesion.  Physical exam. He is afebrile respirations of 18  In general this is a pleasant elderly male in no distress sitting comfortably in his wheelchair.  His skin is warm and dry he does have scattered solar induced changes of his face and arms back and thorax.  He does have what appears to be a skin tag the base of his left thumb there is no surrounding erythema or sign of infection here there is no drainage is flesh-colored.  Muscle skeletal is able to move his upper extremities at baseline continues with baseline lower extremity weakness with wrapping  of his legs.  Neurologic is grossly intact speech is clear.  Psyche is oriented to self is pleasant and appropriate today with some cognitive decline which appears to be stable continues to be complaining of his left thumb lesion  Labs.  INR today was 1.84-was 1.782 days ago.  Just 30th 2017.  Sodium 141 potassium 4.1 BUN 23 creatinine 1.11.  Assessment and plan.  #1-skin lesion-will order a dermatology consult for removal I do not see anything sinister here at this time but will wait dermatology valuation also patient has had numerous solar induced changes feel it will be good idea to have dermatology assess this as well for any other concerning lesions.  #2 anticoagulation management with history of A. fib-INR is slightly subtherapeutic will give another 5 mg tonight and continue for a half milligrams a day-previously at 5 mg he seems to go supratherapeutic so will have to keep an eye on this-of course he has completed Cipro which I suspect contributed to the elevated INR recently.  [id number]   .

## 2015-12-02 ENCOUNTER — Encounter (HOSPITAL_COMMUNITY)
Admission: RE | Admit: 2015-12-02 | Discharge: 2015-12-02 | Disposition: A | Discharge status: Home / Self Care | Payer: Medicare Other | Source: Skilled Nursing Facility | Attending: Internal Medicine | Admitting: Internal Medicine

## 2015-12-02 DIAGNOSIS — Z029 Encounter for administrative examinations, unspecified: Secondary | ICD-10-CM | POA: Insufficient documentation

## 2015-12-02 LAB — PROTIME-INR
INR: 1.91 — AB (ref 0.00–1.49)
PROTHROMBIN TIME: 21.8 s — AB (ref 11.6–15.2)

## 2015-12-03 ENCOUNTER — Encounter (HOSPITAL_COMMUNITY)
Admission: RE | Admit: 2015-12-03 | Discharge: 2015-12-03 | Disposition: A | Discharge status: Home / Self Care | Payer: Medicare Other | Source: Skilled Nursing Facility | Attending: *Deleted | Admitting: *Deleted

## 2015-12-03 DIAGNOSIS — Z029 Encounter for administrative examinations, unspecified: Secondary | ICD-10-CM | POA: Diagnosis not present

## 2015-12-03 LAB — PROTIME-INR
INR: 1.9 — ABNORMAL HIGH (ref 0.00–1.49)
PROTHROMBIN TIME: 21.7 s — AB (ref 11.6–15.2)

## 2015-12-06 ENCOUNTER — Encounter (HOSPITAL_COMMUNITY)
Admission: AD | Admit: 2015-12-06 | Discharge: 2015-12-06 | Disposition: A | Discharge status: Home / Self Care | Payer: Medicare Other | Source: Skilled Nursing Facility | Attending: *Deleted | Admitting: *Deleted

## 2015-12-06 DIAGNOSIS — Z029 Encounter for administrative examinations, unspecified: Secondary | ICD-10-CM | POA: Diagnosis not present

## 2015-12-06 LAB — PROTIME-INR
INR: 2.15 — AB (ref 0.00–1.49)
Prothrombin Time: 23.8 seconds — ABNORMAL HIGH (ref 11.6–15.2)

## 2015-12-13 ENCOUNTER — Non-Acute Institutional Stay (SKILLED_NURSING_FACILITY): Payer: Medicare Other | Admitting: Internal Medicine

## 2015-12-13 ENCOUNTER — Encounter: Payer: Self-pay | Admitting: Internal Medicine

## 2015-12-13 ENCOUNTER — Encounter (HOSPITAL_COMMUNITY)
Admission: RE | Admit: 2015-12-13 | Discharge: 2015-12-13 | Disposition: A | Discharge status: Home / Self Care | Payer: Medicare Other | Source: Skilled Nursing Facility | Attending: *Deleted | Admitting: *Deleted

## 2015-12-13 DIAGNOSIS — Z5181 Encounter for therapeutic drug level monitoring: Secondary | ICD-10-CM | POA: Diagnosis not present

## 2015-12-13 DIAGNOSIS — Z029 Encounter for administrative examinations, unspecified: Secondary | ICD-10-CM | POA: Diagnosis not present

## 2015-12-13 DIAGNOSIS — I4891 Unspecified atrial fibrillation: Secondary | ICD-10-CM | POA: Diagnosis not present

## 2015-12-13 DIAGNOSIS — L989 Disorder of the skin and subcutaneous tissue, unspecified: Secondary | ICD-10-CM | POA: Diagnosis not present

## 2015-12-13 DIAGNOSIS — R635 Abnormal weight gain: Secondary | ICD-10-CM | POA: Diagnosis not present

## 2015-12-13 DIAGNOSIS — Z7901 Long term (current) use of anticoagulants: Secondary | ICD-10-CM | POA: Diagnosis not present

## 2015-12-13 LAB — PROTIME-INR
INR: 1.98 — AB (ref 0.00–1.49)
PROTHROMBIN TIME: 22.4 s — AB (ref 11.6–15.2)

## 2015-12-13 NOTE — Progress Notes (Signed)
Patient ID: [name], male   DOB: 30-May-1932, 82 y.o.   MRN: [id number]  Location:   [facility]   Place of Service:  SNF (31) Provider:  Abigail Miyamoto, MD  Patient Care Team: Pecola Lawless, MD as PCP - General (Internal Medicine) [name], MD as Attending Physician (Urology)  Extended Emergency Contact Information Primary Emergency Contact: [name],[name] Address: [address]          [place], [place] [address] [name] of [place] Home Phone: [phone] Mobile Phone: [phone] Relation: Daughter Secondary Emergency Contact: Orellana,[name] Address: [address]          [place], [place] [address] [name] of [place] Home Phone: [phone] Mobile Phone: [phone] Relation: Son  Code Status:  DNR Goals of care: Advanced Directive information Advanced Directives 11/27/2015  Does patient have an advance directive? Yes  Type of Advance Directive Out of facility DNR (pink MOST or yellow form)  Does patient want to make changes to advanced directive? No - Patient declined  Copy of advanced directive(s) in chart? Yes     Chief Complaint  Patient presents with  . Acute Visit   Acute visit secondary to follow-up left hand lesion-anticoagulation management with history of A. fib-question weight gain HPI:  Pt is a 82 y.o. male seen today for an acute visit for for the above issues.  INR today is 1.98    Anticoagulation this is been somewhat challenging he has had an elevated INR that peaked at over 4 at one point recently he was on Cipro for UTI he has completed ciprofloxacin-Coumadin was held and restarted when INR returned down to 2.53 he was started on 4 mg we have gradually titrated this of currently on 5 mg a day-there has been no increased bruising or bleeding.   .  I also note last weight was 313 on July 10-this appears to be on the higher end of his baseline which appears to be around 305 up to 310.  His legs are currently wrapped but I do not  really see evidence of increased edema nursing staff has not noted this either.  He does not complain of any shortness of breath a cardiac echo done in May 2016 ejection fraction was 5560% with grade 1 diastolic dysfunction he does have a listed diagnosis of dilated cardiomyopathy.  At times he has been on a diuretic secondary to weight gain edema.  Nursing reports he does appear to have a see some mildly increased edema he does have baseline fairly significant lymphedema his legs are wrapped.  Another issue is a small skin tag appearing lesion at the base of his left thumb-dermatology consult has been ordered but apparently patient was picking at it and it has partially come off-there is an erythematous wound bed there is no drainage or active bleeding it does not appear to be infected but we will have to treat this with some antibiotic ointment and continue the dermatology consult to evaluate this area .        Past Medical History  Diagnosis Date  . Venous insufficiency   . AAA (abdominal aortic aneurysm) (HCC)   . Ulcer   . DVT (deep venous thrombosis) (HCC) 05/04/2013    "LLE"  . Cellulitis   . Nicotine dependence   . Bronchospasm   . Elevated PSA   . BPH (benign prostatic hyperplasia)   . COPD (chronic obstructive pulmonary disease) (HCC)   . Cognitive impairment   . Daily headache   .  Arthritis     "probably on the right leg/hip" (09/20/2013)  . Dementia     "don't know exactly what kind" (09/20/2013)  . Kidney stones   . Glaucoma, both eyes   . Atrial fibrillation (HCC)   . Fall at home September 18, 2013  . Benign localized hyperplasia of prostate with urinary retention 12/23/2014  . AAA (abdominal aortic aneurysm) without rupture (HCC) 10/06/2012  . Anticoagulation goal of INR 2 to 3 06/01/2013  . B12 deficiency 09/20/2013  . Bladder calculus 12/23/2014    2.6cm and present since at least 2011 when it was about 1.6cm.   . Cardiomyopathy (HCC) 10/15/2014  . CHF (congestive  heart failure) (HCC) 09/26/2013  . Chronic venous insufficiency 10/06/2012  . Edema-Bilat Leg and foot 03/07/2013  . Multifactorial dementia 10/06/2012  . Obesity, unspecified 10/06/2012  . Peripheral vascular disease (HCC) 09/20/2012  . Pyelonephritis, acute 12/22/2014  . Weakness 09/18/2013  . Wound cellulitis     let ankle wound size of half dollar per nurse at [facility] on 03/25/2015  . E. coli UTI 11/17/15    Cipro X 7 days   Past Surgical History  Procedure Laterality Date  . Cataract extraction w/ intraocular lens  implant, bilateral Bilateral   . Insitu percutaneous pinning femur Right 1986  . Hip fracture surgery Right 2009  . Back surgery    . Posterior laminectomy / decompression lumbar spine  2004    Decompressive laminectomy unilaterally on the right at L4-5 with  . Femur fracture surgery Right 04/1985  . Cystoscopy w/ stone manipulation  1970's    "once"  . Spine surgery    . Eye surgery    . Cholecystectomy  2011    Gall Bladder  . Cystoscopy with litholapaxy N/A 03/28/2015    Procedure: CYSTOSCOPY WITH LITHOLAPAXY;  Surgeon: Malen Gauze, MD;  Location: [facility];  Service: Urology;  Laterality: N/A;  . Transurethral resection of prostate N/A 03/28/2015    Procedure: TRANSURETHRAL RESECTION OF THE PROSTATE ;  Surgeon: Malen Gauze, MD;  Location: [facility];  Service: Urology;  Laterality: N/A;    Allergies  Allergen Reactions  . Oxycodone Hcl Other (See Comments)     makes pt "wild"  . Propoxyphene N-Acetaminophen Other (See Comments)     Makes pt. "wild"      Current Outpatient Prescriptions on File Prior to Visit  Medication Sig Dispense Refill  . acetaminophen (TYLENOL) 500 MG tablet Take 1,000 mg by mouth every 6 (six) hours as needed for mild pain or fever.     . Amino Acids-Protein Hydrolys (FEEDING SUPPLEMENT, PRO-STAT SUGAR FREE 64,) LIQD Take 30 mLs by mouth 2 (two) times daily between meals.     [name] diltiazem (CARDIZEM CD) 120 MG 24 hr  capsule Take 120 mg by mouth every morning.     . divalproex (DEPAKOTE) 250 MG DR tablet Take 250 mg by mouth 2 (two) times daily.    [name] doxazosin (CARDURA) 8 MG tablet Take 8 mg by mouth every morning.     . Fluticasone-Salmeterol (ADVAIR) 250-50 MCG/DOSE AEPB Inhale 1 puff into the lungs 2 (two) times daily.    . Gauze Pads & Dressings (KERLIX BANDAGE ROLL) MISC Wrap RLE with kerlix, coban, change Monday , Wednesday , Friday and prn    . HYDROcodone-acetaminophen (NORCO/VICODIN) 5-325 MG tablet Take one tablet by mouth every 4 hours as needed for moderate pain. Max APAP--3gm/24 hrs from all sources 180 tablet 0  .  ipratropium-albuterol (DUONEB) 0.5-2.5 (3) MG/3ML SOLN 3ml via Neb inhalation three times a day for congestion scheduled and 4 times daily as needed for SOB or congestion    . latanoprost (XALATAN) 0.005 % ophthalmic solution Place 1 drop into both eyes at bedtime.     . methenamine (HIPREX) 1 g tablet Take 1 g by mouth 2 (two) times daily with a meal.    . metoprolol tartrate (LOPRESSOR) 25 MG tablet Take 0.5 tablets (12.5 mg total) by mouth 2 (two) times daily. 30 tablet 0  . sertraline (ZOLOFT) 25 MG tablet Take three tablets by mouth once daily    . triamcinolone cream (KENALOG) 0.1 % Apply triamcinolone 0.1 % and 1/2 cetaphil cream  to both Rt. and Lt. leg before wrapping on Mon,Wed,Fri.    Jarrett Soho oil (XENADERM) ointment Apply xenaderm ointment to bilateral buttocks and sacrum q shift & prn for prevention every shift    . warfarin (COUMADIN) 4 MG tablet Administer 4 mg along with 1/2 tab to equal 4.5 mg on Mon,Wed at 5:00 PM    . warfarin (COUMADIN) 4 MG tablet Give 4 mg on Sun,Tue,Thur,Fri,Sat at 5:00 PM     No current facility-administered medications on file prior to visit.      Review of Systems  In general is not complaining of any fever or chills appears does had some weight gain.  Skin does not complain of rashes or itching--skin lesion on left hand as  noted above.  Does have chronic venous stasis changes lower extremities his legs currently are wrapped.  Ears eyes nose mouth throat does not complain of sore throat or visual changes.  Respiratory does not complain of shortness breath or cough.  Cardiac denies chest pain does again have significant lymphedema edema. His legs are usually wrapped  GI does not complain of nausea vomiting diarrhea constipation or abdominal discomfort.  GU does have a chronic indwelling Foley catheter with history of urinary obstruction BPH.  Does not complain of dysuria.  Muscle skeletal has lower extremity weakness but does not complain of pain currently.  Neurologic is not complaining of dizziness headache or syncopal-type feelings.   Psych does have some cognitive deficits but appears to be doing well with supportive care at times will have behaviors but these have stabilized it appears recently    Immunization History  Administered Date(s) Administered  . Influenza,inj,Quad PF,36+ Mos 03/20/2013, 03/23/2014, 02/11/2015  . PPD Test 12/26/2014, 01/09/2015  . Pneumococcal Polysaccharide-23 03/02/2007, 02/11/2015  . Td 12/30/1996   Pertinent  Health Maintenance Due  Topic Date Due  . INFLUENZA VACCINE  12/31/2015  . PNA vac Low Risk Adult (2 of 2 - PCV13) 02/11/2016   Fall Risk  01/04/2015 04/18/2014 08/15/2013  Falls in the past year? Yes Yes Yes  Number falls in past yr: 2 or more 1 2 or more  Injury with Fall? No Yes -  Risk Factor Category  - High Fall Risk High Fall Risk  Risk for fall due to : Impaired balance/gait;Impaired mobility Impaired mobility;Mental status change History of fall(s);Impaired balance/gait;Impaired mobility;Mental status change   Functional Status Survey:    Filed Vitals:   12/13/15 1932  BP: 109/56  Pulse: 76  Temp: 98.3 F (36.8 C)  Resp: 18  -most recent weight on July 10 is listed as 313  Physical Exam   In general this is a pleasant elderly Male in  no distress  His skin is warm and dry continues with wrapping of his  lower legs bilaterally. At the base of his left thumb there is small erythematous wound bed without active bleeding or surrounding erythema-this appears to be the site of a skin tag that apparently patient has picked off  Eyes pupils appear reactive to light sclerae and conjunctivae were clear visual acuity appears grossly intact.  Oropharynx clear mucous membranes moist.  Chest is largely clear to auscultation there is some minimal bronchial sounds on the lower right side no labored breathing.   Heart is rate and rate and rhythm with distant heart sounds-legs are currently wrapped I do not really see increased edema from baseline  Abdomen is obese soft nontender positive bowel sounds.  GU has a chronic indwelling Foley catheter draining amber colored urine.  Muscle skeletal does have lower extremity weakness this is baseline moves upper extremities with baseline strength.  Neurologic is grossly intact to speech is clear no lateralizing findings.  Psyche is oriented to self pleasant appropriate he does have confusion dementia I would classify as moderate  Labs reviewed:  12/13/2015.  INR-1.98.    Recent Labs  11/14/15 2312 11/20/15 0710 11/29/15 0700  NA 140 143 141  K 4.0 4.2 4.1  CL 107 111 109  CO2 27 30 27   GLUCOSE 105* 98 90  BUN 37* 28* 23*  CREATININE 1.30* 1.21 1.11  CALCIUM 8.5* 8.4* 8.3*    Recent Labs  10/24/15 0705 11/04/15 0530 11/14/15 2312  AST 15 13* 17  ALT 14* 12* 14*  ALKPHOS 76 65 59  BILITOT 0.3 0.4 0.6  PROT 6.6 6.3* 6.5  ALBUMIN 3.1* 2.9* 3.1*    Recent Labs  10/24/15 0705 11/04/15 0530 11/08/15 0715 11/14/15 2312  WBC 7.6 8.1 7.2 8.7  NEUTROABS 3.9  --  3.2 5.6  HGB 10.9* 10.2* 11.4* 11.2*  HCT 35.2* 32.6* 36.6* 34.7*  MCV 84.8 85.3 85.5 84.6  PLT 237 209 254 267   Lab Results  Component Value Date   TSH 1.960 09/22/2013   Lab Results  Component  Value Date   HGBA1C 6.3* 10/24/2015   Lab Results  Component Value Date   CHOL 157 10/06/2012   HDL 34* 10/06/2012   LDLCALC 96 10/06/2012   TRIG 135 10/06/2012   CHOLHDL 4.6 10/06/2012    Significant Diagnostic Results in last 30 days:  No results found.  Assessment/Plan   #1-skin lesion that apparently is come off-at this point does not appear to be infected we'll treat with antibiotic ointment twice a day and cover with dry dressing monitor for any changes-he does have a dermatology consult scheduled early next week.  #2 question weight gain-he does not really have increased edema or symptomatic here-will order weights tomorrow and notify provider of any additional weight gain--apparently he does have a good appetite which may be contributing to this.  #3 history of anticoagulation on chronic Coumadin with history of A. fib and DVT in the past-INR is minimally subtherapeutic will give 5.5 mg of Coumadin tonight and then continue 5 mg a day-recheck PT/INR  early next week -Afib   appears rate controlled on beta blocker   [id number]  #1 weight gain-per nursing staff appears she does have possibly slightly more edema-although there is variability in his weights-we will give him a dose of Lasix 40 mg today and 20 mg tomorrow with potassium when he milliequivalents today and 10 mEq tomorrow-monitor weights daily notify provider of any continued weight gain-also will update a metabolic panel on Friday tto ensure stability of renal  function and electrolytes-clinically he appears to be stable he does again have a history of diastolic CHF and dilated cardiomyopathy has had times received a diuretic short-term.  #2 anticoagulation management with history of A. fib-this appears rate controlled on Cardizem-INR has dropped somewhat-currently on 4 mg a day will increase this to 5 mg 1 dose and then up to 4-1/2 mg starting tomorrow night will check an INR on Friday to see where we stand-he is no  longer on Cipro which I suspect elevated his INR previously.  [id number]     Edmon CrapeArlo Shekina Cordell, PA-C [phone]

## 2015-12-16 ENCOUNTER — Encounter (HOSPITAL_COMMUNITY)
Admission: RE | Admit: 2015-12-16 | Discharge: 2015-12-16 | Disposition: A | Discharge status: Home / Self Care | Payer: Medicare Other | Source: Skilled Nursing Facility | Attending: *Deleted | Admitting: *Deleted

## 2015-12-16 ENCOUNTER — Encounter: Payer: Self-pay | Admitting: Internal Medicine

## 2015-12-16 DIAGNOSIS — L570 Actinic keratosis: Secondary | ICD-10-CM | POA: Diagnosis not present

## 2015-12-16 DIAGNOSIS — X32XXXD Exposure to sunlight, subsequent encounter: Secondary | ICD-10-CM | POA: Diagnosis not present

## 2015-12-16 DIAGNOSIS — B078 Other viral warts: Secondary | ICD-10-CM | POA: Diagnosis not present

## 2015-12-16 DIAGNOSIS — R7303 Prediabetes: Secondary | ICD-10-CM | POA: Insufficient documentation

## 2015-12-16 DIAGNOSIS — Z029 Encounter for administrative examinations, unspecified: Secondary | ICD-10-CM | POA: Diagnosis not present

## 2015-12-16 LAB — BASIC METABOLIC PANEL
ANION GAP: 7 (ref 5–15)
BUN: 28 mg/dL — ABNORMAL HIGH (ref 6–20)
CO2: 26 mmol/L (ref 22–32)
Calcium: 8.5 mg/dL — ABNORMAL LOW (ref 8.9–10.3)
Chloride: 108 mmol/L (ref 101–111)
Creatinine, Ser: 1.04 mg/dL (ref 0.61–1.24)
Glucose, Bld: 88 mg/dL (ref 65–99)
POTASSIUM: 4.3 mmol/L (ref 3.5–5.1)
SODIUM: 141 mmol/L (ref 135–145)

## 2015-12-16 LAB — PROTIME-INR
INR: 1.89 — AB (ref 0.00–1.49)
Prothrombin Time: 21.7 seconds — ABNORMAL HIGH (ref 11.6–15.2)

## 2015-12-19 ENCOUNTER — Encounter (HOSPITAL_COMMUNITY)
Admission: RE | Admit: 2015-12-19 | Discharge: 2015-12-19 | Disposition: A | Discharge status: Home / Self Care | Payer: Medicare Other | Source: Skilled Nursing Facility | Attending: *Deleted | Admitting: *Deleted

## 2015-12-19 ENCOUNTER — Encounter: Payer: Self-pay | Admitting: *Deleted

## 2015-12-19 DIAGNOSIS — Z029 Encounter for administrative examinations, unspecified: Secondary | ICD-10-CM | POA: Diagnosis not present

## 2015-12-19 LAB — PROTIME-INR
INR: 2.45 — ABNORMAL HIGH (ref 0.00–1.49)
Prothrombin Time: 26.3 seconds — ABNORMAL HIGH (ref 11.6–15.2)

## 2015-12-26 ENCOUNTER — Encounter (HOSPITAL_COMMUNITY)
Admission: RE | Admit: 2015-12-26 | Discharge: 2015-12-26 | Disposition: A | Discharge status: Home / Self Care | Payer: Medicare Other | Source: Skilled Nursing Facility | Attending: *Deleted | Admitting: *Deleted

## 2015-12-26 DIAGNOSIS — Z029 Encounter for administrative examinations, unspecified: Secondary | ICD-10-CM | POA: Diagnosis not present

## 2015-12-26 LAB — PROTIME-INR
INR: 2.8
PROTHROMBIN TIME: 30.1 s — AB (ref 11.4–15.2)

## 2016-01-02 ENCOUNTER — Encounter (HOSPITAL_COMMUNITY)
Admission: RE | Admit: 2016-01-02 | Discharge: 2016-01-02 | Disposition: A | Discharge status: Home / Self Care | Payer: Medicare Other | Source: Skilled Nursing Facility | Attending: Internal Medicine | Admitting: Internal Medicine

## 2016-01-02 ENCOUNTER — Non-Acute Institutional Stay (SKILLED_NURSING_FACILITY): Payer: Medicare Other | Admitting: Internal Medicine

## 2016-01-02 ENCOUNTER — Encounter: Payer: Self-pay | Admitting: Internal Medicine

## 2016-01-02 DIAGNOSIS — I482 Chronic atrial fibrillation: Secondary | ICD-10-CM | POA: Diagnosis not present

## 2016-01-02 DIAGNOSIS — I480 Paroxysmal atrial fibrillation: Secondary | ICD-10-CM

## 2016-01-02 DIAGNOSIS — F0390 Unspecified dementia without behavioral disturbance: Secondary | ICD-10-CM

## 2016-01-02 DIAGNOSIS — F0391 Unspecified dementia with behavioral disturbance: Secondary | ICD-10-CM | POA: Diagnosis present

## 2016-01-02 DIAGNOSIS — Z7901 Long term (current) use of anticoagulants: Secondary | ICD-10-CM | POA: Insufficient documentation

## 2016-01-02 DIAGNOSIS — F068 Other specified mental disorders due to known physiological condition: Secondary | ICD-10-CM | POA: Diagnosis not present

## 2016-01-02 DIAGNOSIS — F39 Unspecified mood [affective] disorder: Secondary | ICD-10-CM | POA: Insufficient documentation

## 2016-01-02 DIAGNOSIS — I5032 Chronic diastolic (congestive) heart failure: Secondary | ICD-10-CM | POA: Insufficient documentation

## 2016-01-02 DIAGNOSIS — Z86718 Personal history of other venous thrombosis and embolism: Secondary | ICD-10-CM

## 2016-01-02 DIAGNOSIS — H906 Mixed conductive and sensorineural hearing loss, bilateral: Secondary | ICD-10-CM | POA: Diagnosis not present

## 2016-01-02 LAB — PROTIME-INR
INR: 3.99
PROTHROMBIN TIME: 39.9 s — AB (ref 11.4–15.2)

## 2016-01-02 NOTE — Progress Notes (Signed)
Chief Complaint  Patient presents with  . Medical Management of Chronic Issues    Medical Management of Chronic Issues   Allergies  Allergen Reactions  . Oxycodone Hcl Other (See Comments)     makes pt "wild"  . Propoxyphene N-Acetaminophen Other (See Comments)     Makes pt. "wild"  Dr. Alwyn Ren DNR  This is a nursing facility follow up of chronic medical diagnoses  Interim medical record and care since last [facility] visit was updated with review of diagnostic studies and change in clinical status since last visit were documented.  ZOX:WRUE gentleman has a history of atrial fibrillation with rapid ventricular response as well as deep venous thrombosis of the left lower extremity. He is on chronicanticoagulation with warfarin. His PT/INRs have been markedly variable  & difficult to manage. Recent PT/INRs have varied from 1.89-3.99 on a dose of 5.5 mg daily. The novel oral anticoagulant appears to be prohibitively expensive an option based on preliminary assessment. The last EKG on record was 02/11/15. This revealed sinus tachycardia with multiple PVCs. He does have multifactorial dementia without behavioral issues.  His COPD has been well controlled with present pulmonary toilet regimen. He has profound loss of hearing. Hearing aids have benefited his interaction with family and staff.  Comprehensive review of systems was completed and all answers are negative. Veracity is in  question due to his dementia. He did identify the date as August 3 but could not give me the year or identify the president.  Constitutional: No fever,significant weight change, fatigue  Eyes: No redness, discharge, pain, vision change ENT/mouth: No nasal congestion,  purulent discharge, earache,change in hearing ,sore throat  Cardiovascular: No chest pain, palpitations,paroxysmal nocturnal dyspnea, claudication, edema  Respiratory: No cough, sputum production,hemoptysis, DOE , significant  snoring,apnea  Gastrointestinal: No heartburn,dysphagia,abdominal pain, nausea / vomiting,rectal bleeding, melena,change in bowels Genitourinary: No dysuria,hematuria, pyuria,  incontinence, nocturia Musculoskeletal: No joint stiffness, joint swelling, weakness,pain Dermatologic: No rash, pruritus, change in appearance of skin Neurologic: No dizziness,headache,syncope, seizures, numbness , tingling Psychiatric: No significant anxiety , depression, insomnia, anorexia Endocrine: No change in hair/skin/ nails, excessive thirst, excessive hunger, excessive urination  Hematologic/lymphatic: No significant bruising, lymphadenopathy,abnormal bleeding Allergy/immunology: No itchy/ watery eyes, significant sneezing, urticaria, angioedema  Physical exam:  Pertinent or positive findings: There is slight ptosis on the left. He is only wearing the upper dental plate. He has bilateral hearing aids. Breath sounds are decreased. He has a grade 1 systolic murmur at the base. There are carotid bruits versus radiation of the murmur. He has edema of the lower extremities despite unna wrapping of both feet. Pedal pulses are decreased. He exhibits a bilateral pill rolling tremor of the hands. Fusiform knee changes General appearance:Adequately nourished; no acute distress , increased work of breathing is present.   Lymphatic: No lymphadenopathy about the head, neck, axilla . Eyes: No conjunctival inflammation or lid edema is present. There is no scleral icterus. Ears:  External ear exam shows no significant lesions or deformities.   Nose:  External nasal examination shows no deformity or inflammation. Nasal mucosa are pink and moist without lesions ,exudates Oral exam: lips and gums are healthy appearing.There is no oropharyngeal erythema or exudate . Neck:  No thyromegaly, masses, tenderness noted.    Heart:  Normal rate and regular rhythm. S1 and S2 normal without gallop,click, rub .  Lungs:Chest clear to  auscultation without wheezes, rhonchi,rales , rubs. Abdomen:Bowel sounds are normal. Abdomen is soft and nontender with no organomegaly,  hernias,masses. GU: deferred as previously addressed. Extremities:  No cyanosis, clubbing  Neurologic exam : Strength equal  in upper & lower extremities Balance,Rhomberg,finger to nose testing could not be completed due to clinical state Deep tendon reflexes are equal but 0+ Skin: Warm & dry w/o tenting. No significant lesions or rash.    See summary under each active problem in the Problem List with associated updated therapeutic plan

## 2016-01-02 NOTE — Assessment & Plan Note (Signed)
Consider Xarelto in place of warfarin

## 2016-01-02 NOTE — Assessment & Plan Note (Signed)
No behavioral issues No change in therapy

## 2016-01-02 NOTE — Patient Instructions (Addendum)
Because his PT/INRs are labile and difficult to manage;  Xarelto will be substituted as maintenance prophylaxis unless this proves prohibitively expensive. Warfarin will be held today as his PT/INR is 3.99 and Xarelto will be initiated when the PT/INR is less then 2.

## 2016-01-02 NOTE — Assessment & Plan Note (Signed)
Significant benefit from hearing aids with improved interaction with family and staff

## 2016-01-02 NOTE — Assessment & Plan Note (Addendum)
He admits regular rhythm at this time  Reassess possibility of a novel oral anticoagulant due to lability of the PT/INRs

## 2016-01-03 ENCOUNTER — Non-Acute Institutional Stay (SKILLED_NURSING_FACILITY): Payer: Medicare Other | Admitting: Internal Medicine

## 2016-01-03 ENCOUNTER — Encounter (HOSPITAL_COMMUNITY)
Admission: RE | Admit: 2016-01-03 | Discharge: 2016-01-03 | Disposition: A | Discharge status: Home / Self Care | Payer: Medicare Other | Source: Skilled Nursing Facility | Attending: *Deleted | Admitting: *Deleted

## 2016-01-03 DIAGNOSIS — I482 Chronic atrial fibrillation, unspecified: Secondary | ICD-10-CM

## 2016-01-03 DIAGNOSIS — I5032 Chronic diastolic (congestive) heart failure: Secondary | ICD-10-CM | POA: Diagnosis not present

## 2016-01-03 DIAGNOSIS — Z7901 Long term (current) use of anticoagulants: Secondary | ICD-10-CM | POA: Diagnosis not present

## 2016-01-03 DIAGNOSIS — IMO0001 Reserved for inherently not codable concepts without codable children: Secondary | ICD-10-CM

## 2016-01-03 DIAGNOSIS — J00 Acute nasopharyngitis [common cold]: Secondary | ICD-10-CM

## 2016-01-03 DIAGNOSIS — R791 Abnormal coagulation profile: Secondary | ICD-10-CM

## 2016-01-03 LAB — PROTIME-INR
INR: 4.42 — AB
Prothrombin Time: 43.3 seconds — ABNORMAL HIGH (ref 11.4–15.2)

## 2016-01-03 NOTE — Progress Notes (Signed)
This is an acute visit.  Level care skilled.  Facility [facility].  Chief complaint-acute visit secondary to anticoagulation management-cold symptoms  HPI:  Pt is a 82 y.o. male seen today for an acute visit for for the above issues.  INR has been supratherapeutic recently was actually 3.99 yesterday and Coumadin is on hold actually this up a bit at 4.42 today-there's been no increased evidence of bruising or bleeding he is sitting in his wheelchair comfortably today.  He is on Coumadin with a history of atrial fibrillation Dr. Alwyn Ren has recently assess this and suggested Xarelto may be a more suitable alternative once the INR comes down.  Patient also is complaining of some cold symptoms he does not complain of any shortness of breath fever or chills says he does have a cough productive of clear phlegm at times.  He does have a history COPD this is been relatively stable he does receive nebulizers.              Past Medical History  Diagnosis Date  . Venous insufficiency   . AAA (abdominal aortic aneurysm) (HCC)   . Ulcer   . DVT (deep venous thrombosis) (HCC) 05/04/2013    "LLE"  . Cellulitis   . Nicotine dependence   . Bronchospasm   . Elevated PSA   . BPH (benign prostatic hyperplasia)   . COPD (chronic obstructive pulmonary disease) (HCC)   . Cognitive impairment   . Daily headache   . Arthritis     "probably on the right leg/hip" (09/20/2013)  . Dementia     "don't know exactly what kind" (09/20/2013)  . Kidney stones   . Glaucoma, both eyes   . Atrial fibrillation (HCC)   . Fall at home September 18, 2013  . Benign localized hyperplasia of prostate with urinary retention 12/23/2014  . AAA (abdominal aortic aneurysm) without rupture (HCC) 10/06/2012  . Anticoagulation goal of INR 2 to 3 06/01/2013  . B12 deficiency 09/20/2013  . Bladder calculus 12/23/2014    2.6cm and present since at least 2011 when it was about 1.6cm.   .  Cardiomyopathy (HCC) 10/15/2014  . CHF (congestive heart failure) (HCC) 09/26/2013  . Chronic venous insufficiency 10/06/2012  . Edema-Bilat Leg and foot 03/07/2013  . Multifactorial dementia 10/06/2012  . Obesity, unspecified 10/06/2012  . Peripheral vascular disease (HCC) 09/20/2012  . Pyelonephritis, acute 12/22/2014  . Weakness 09/18/2013  . Wound cellulitis     let ankle wound size of half dollar per nurse at [facility] on 03/25/2015  . E. coli UTI 11/17/15    Cipro X 7 days         Past Surgical History  Procedure Laterality Date  . Cataract extraction w/ intraocular lens  implant, bilateral Bilateral   . Insitu percutaneous pinning femur Right 1986  . Hip fracture surgery Right 2009  . Back surgery    . Posterior laminectomy / decompression lumbar spine  2004    Decompressive laminectomy unilaterally on the right at L4-5 with  . Femur fracture surgery Right 04/1985  . Cystoscopy w/ stone manipulation  1970's    "once"  . Spine surgery    . Eye surgery    . Cholecystectomy  2011    Gall Bladder  . Cystoscopy with litholapaxy N/A 03/28/2015    Procedure: CYSTOSCOPY WITH LITHOLAPAXY;  Surgeon: Malen Gauze, MD;  Location: [facility];  Service: Urology;  Laterality: N/A;  . Transurethral resection of prostate N/A 03/28/2015  Procedure: TRANSURETHRAL RESECTION OF THE PROSTATE ;  Surgeon: Malen Gauze, MD;  Location: [facility];  Service: Urology;  Laterality: N/A;         Allergies  Allergen Reactions  . Oxycodone Hcl Other (See Comments)     makes pt "wild"  . Propoxyphene N-Acetaminophen Other (See Comments)     Makes pt. "wild"            Current Outpatient Prescriptions on File Prior to Visit  Medication Sig Dispense Refill  . acetaminophen (TYLENOL) 500 MG tablet Take 1,000 mg by mouth every 6 (six) hours as needed for mild pain or fever.     . Amino Acids-Protein Hydrolys (FEEDING SUPPLEMENT, PRO-STAT SUGAR FREE 64,)  LIQD Take 30 mLs by mouth 2 (two) times daily between meals.     [name] diltiazem (CARDIZEM CD) 120 MG 24 hr capsule Take 120 mg by mouth every morning.     . divalproex (DEPAKOTE) 250 MG DR tablet Take 250 mg by mouth 2 (two) times daily.    [name] doxazosin (CARDURA) 8 MG tablet Take 8 mg by mouth every morning.     . Fluticasone-Salmeterol (ADVAIR) 250-50 MCG/DOSE AEPB Inhale 1 puff into the lungs 2 (two) times daily.    . Gauze Pads & Dressings (KERLIX BANDAGE ROLL) MISC Wrap RLE with kerlix, coban, change Monday , Wednesday , Friday and prn    . HYDROcodone-acetaminophen (NORCO/VICODIN) 5-325 MG tablet Take one tablet by mouth every 4 hours as needed for moderate pain. Max APAP--3gm/24 hrs from all sources 180 tablet 0  . ipratropium-albuterol (DUONEB) 0.5-2.5 (3) MG/3ML SOLN 77ml via Neb inhalation three times a day for congestion scheduled and 4 times daily as needed for SOB or congestion    . latanoprost (XALATAN) 0.005 % ophthalmic solution Place 1 drop into both eyes at bedtime.     . methenamine (HIPREX) 1 g tablet Take 1 g by mouth 2 (two) times daily with a meal.    . metoprolol tartrate (LOPRESSOR) 25 MG tablet Take 0.5 tablets (12.5 mg total) by mouth 2 (two) times daily. 30 tablet 0  . sertraline (ZOLOFT) 25 MG tablet Take three tablets by mouth once daily    . triamcinolone cream (KENALOG) 0.1 % Apply triamcinolone 0.1 % and 1/2 cetaphil cream  to both Rt. and Lt. leg before wrapping on Mon,Wed,Fri.    Jarrett Soho oil (XENADERM) ointment Apply xenaderm ointment to bilateral buttocks and sacrum q shift & prn for prevention every shift    .       [name]       No current facility-administered medications on file prior to visit.      Review of Systems  In general is not complaining of any fever or chills appears does had some weight gain.  Skin does not complain of rashes or itching--skin lesion on left hand as noted above.  Does have chronic  venous stasis changes lower extremities his legs currently are wrapped.  Ears eyes nose mouth throat does not complain of sore throat or visual changes.  Respiratory does not complain of shortness breath  but does have a cough of clear phlegm at times-feels he is getting a cold  Cardiac denies chest pain does again have significant lymphedema edema. His legs are usually wrapped  GI does not complain of nausea vomiting diarrhea constipation or abdominal discomfort.  GU does have a chronic indwelling Foley catheter with history of urinary obstruction BPH.  Does not complain of dysuria.  Muscle skeletal has lower extremity weakness but does not complain of pain currently.  Neurologic is not complaining of dizziness headache or syncopal-type feelings.   Psych does have some cognitive deficits but appears to be doing well with supportive care at times will have behaviors but these have stabilized it appears recently        Immunization History  Administered Date(s) Administered  . Influenza,inj,Quad PF,36+ Mos 03/20/2013, 03/23/2014, 02/11/2015  . PPD Test 12/26/2014, 01/09/2015  . Pneumococcal Polysaccharide-23 03/02/2007, 02/11/2015  . Td 12/30/1996       Pertinent  Health Maintenance Due  Topic Date Due  . INFLUENZA VACCINE  12/31/2015  . PNA vac Low Risk Adult (2 of 2 - PCV13) 02/11/2016   Fall Risk  01/04/2015 04/18/2014 08/15/2013  Falls in the past year? Yes Yes Yes  Number falls in past yr: 2 or more 1 2 or more  Injury with Fall? No Yes -  Risk Factor Category  - High Fall Risk High Fall Risk  Risk for fall due to : Impaired balance/gait;Impaired mobility Impaired mobility;Mental status change History of fall(s);Impaired balance/gait;Impaired mobility;Mental status change   Functional Status Survey:       Filed Vitals:   Temperature 99.1 pulse 65 respirations 24 blood pressure 116/62  Physical Exam   In general this is a pleasant elderly Male  in no distress  His skin is warm and dry continues with wrapping of his lower legs bilaterally.  f  Eyes pupils appear reactive to light sclerae and conjunctivae were clear visual acuity appears grossly intact.  Oropharynx clear mucous membranes moist.  Chest is largely clear to auscultation there  Some diffuse minimal bronchial sounds  no labored breathing.   Heart is rate and rate and rhythm with distant heart sounds-legs are currently wrapped I do not really see increased edema from baseline  Abdomen is obese soft nontender positive bowel sounds.  GU has a chronic indwelling Foley catheter draining amber colored urine.  Muscle skeletal does have lower extremity weakness this is baseline moves upper extremities with baseline strength.  Neurologic is grossly intact to speech is clear no lateralizing findings.  Psyche is oriented to self pleasant appropriate he does have confusion dementia I would classify as moderate  Labs reviewed:  01/03/2016-INR 4.42.  01/02/2016-INR 3.99.  12/26/2015.  INR 2.8  12/13/2015.  INR-1.98.    Recent Labs (within last 365 days)   Recent Labs  11/14/15 2312 11/20/15 0710 11/29/15 0700  NA 140 143 141  K 4.0 4.2 4.1  CL 107 111 109  CO2 GLUCOSE 105* 98 90  BUN 37* 28* 23*  CREATININE 1.30* 1.21 1.11  CALCIUM 8.5* 8.4* 8.3*      Recent Labs (within last 365 days)   Recent Labs  10/24/15 0705 11/04/15 0530 11/14/15 2312  AST 15 13* 17  ALT 14* 12* 14*  ALKPHOS 76 65 59  BILITOT 0.3 0.4 0.6  PROT 6.6 6.3* 6.5  ALBUMIN 3.1* 2.9* 3.1*      Recent Labs (within last 365 days)   Recent Labs  10/24/15 0705 11/04/15 0530 11/08/15 0715 11/14/15 2312  WBC 7.6 8.1 7.2 8.7  NEUTROABS 3.9  --  3.2 5.6  HGB 10.9* 10.2* 11.4* 11.2*  HCT 35.2* 32.6* 36.6* 34.7*  MCV 84.8 85.3 85.5 84.6  PLT 237 209 254 267     Recent Labs       Lab Results  Component Value Date   TSH 1.960  09/22/2013     Recent Labs  Lab Results  Component Value Date   HGBA1C 6.3* 10/24/2015     Recent Labs       Lab Results  Component Value Date   CHOL 157 10/06/2012   HDL 34* 10/06/2012   LDLCALC 96 10/06/2012   TRIG 135 10/06/2012   CHOLHDL 4.6 10/06/2012      Significant Diagnostic Results in last 30 days:  Imaging Results  No results found.    Assessment/Plan  #1-supratherapeutic INR-INR actually has gone up compared to yesterday now at 4.42 no evidence increased bruising or bleeding-we will continue to hold Coumadin and check an INR tomorrow-again recommendation is to possibly start Xarelto once INR comes down into normal range.  Atrial fibrillation appears to be rate controlled On Cardizem as well as Lopressor.  #2-cold symptoms-clinically he appears to be stable here no shortness of breath no fever no chills but this will have to be watched will start Mucinex 600 milligrams by mouth twice a day for 5 days and then again keep an eye on this he does continue with routine nebulizers as well.  [id number]

## 2016-01-04 ENCOUNTER — Encounter (HOSPITAL_COMMUNITY)
Admission: AD | Admit: 2016-01-04 | Discharge: 2016-01-04 | Disposition: A | Discharge status: Home / Self Care | Payer: Medicare Other | Source: Skilled Nursing Facility | Attending: *Deleted | Admitting: *Deleted

## 2016-01-04 DIAGNOSIS — I482 Chronic atrial fibrillation: Secondary | ICD-10-CM | POA: Diagnosis not present

## 2016-01-04 DIAGNOSIS — I5032 Chronic diastolic (congestive) heart failure: Secondary | ICD-10-CM | POA: Diagnosis not present

## 2016-01-04 DIAGNOSIS — Z7901 Long term (current) use of anticoagulants: Secondary | ICD-10-CM | POA: Diagnosis not present

## 2016-01-04 LAB — PROTIME-INR
INR: 3.5
PROTHROMBIN TIME: 36 s — AB (ref 11.4–15.2)

## 2016-01-05 ENCOUNTER — Encounter (HOSPITAL_COMMUNITY)
Admission: RE | Admit: 2016-01-05 | Discharge: 2016-01-05 | Disposition: A | Discharge status: Home / Self Care | Payer: Medicare Other | Source: Skilled Nursing Facility | Attending: *Deleted | Admitting: *Deleted

## 2016-01-05 DIAGNOSIS — Z7901 Long term (current) use of anticoagulants: Secondary | ICD-10-CM | POA: Diagnosis not present

## 2016-01-05 DIAGNOSIS — I482 Chronic atrial fibrillation: Secondary | ICD-10-CM | POA: Diagnosis not present

## 2016-01-05 DIAGNOSIS — I5032 Chronic diastolic (congestive) heart failure: Secondary | ICD-10-CM | POA: Diagnosis not present

## 2016-01-05 LAB — PROTIME-INR
INR: 2.49
PROTHROMBIN TIME: 27.4 s — AB (ref 11.4–15.2)

## 2016-01-06 ENCOUNTER — Other Ambulatory Visit (HOSPITAL_COMMUNITY)
Admission: AD | Admit: 2016-01-06 | Discharge: 2016-01-06 | Disposition: A | Discharge status: Home / Self Care | Payer: Medicare Other | Source: Skilled Nursing Facility | Attending: Internal Medicine | Admitting: Internal Medicine

## 2016-01-06 DIAGNOSIS — F0391 Unspecified dementia with behavioral disturbance: Secondary | ICD-10-CM | POA: Insufficient documentation

## 2016-01-06 DIAGNOSIS — I5032 Chronic diastolic (congestive) heart failure: Secondary | ICD-10-CM | POA: Insufficient documentation

## 2016-01-06 DIAGNOSIS — F39 Unspecified mood [affective] disorder: Secondary | ICD-10-CM | POA: Diagnosis present

## 2016-01-06 LAB — PROTIME-INR
INR: 2.27
PROTHROMBIN TIME: 25.4 s — AB (ref 11.4–15.2)

## 2016-01-07 ENCOUNTER — Encounter (HOSPITAL_COMMUNITY)
Admission: RE | Admit: 2016-01-07 | Discharge: 2016-01-07 | Disposition: A | Discharge status: Home / Self Care | Payer: Medicare Other | Source: Skilled Nursing Facility | Attending: *Deleted | Admitting: *Deleted

## 2016-01-07 DIAGNOSIS — Z7901 Long term (current) use of anticoagulants: Secondary | ICD-10-CM | POA: Diagnosis not present

## 2016-01-07 DIAGNOSIS — I5032 Chronic diastolic (congestive) heart failure: Secondary | ICD-10-CM | POA: Diagnosis not present

## 2016-01-07 DIAGNOSIS — I482 Chronic atrial fibrillation: Secondary | ICD-10-CM | POA: Diagnosis not present

## 2016-01-07 LAB — PROTIME-INR
INR: 2.28
Prothrombin Time: 25.5 seconds — ABNORMAL HIGH (ref 11.4–15.2)

## 2016-01-08 ENCOUNTER — Encounter (HOSPITAL_COMMUNITY)
Admission: RE | Admit: 2016-01-08 | Discharge: 2016-01-08 | Disposition: A | Discharge status: Home / Self Care | Payer: Medicare Other | Source: Skilled Nursing Facility | Attending: *Deleted | Admitting: *Deleted

## 2016-01-08 DIAGNOSIS — I5032 Chronic diastolic (congestive) heart failure: Secondary | ICD-10-CM | POA: Diagnosis not present

## 2016-01-08 DIAGNOSIS — I482 Chronic atrial fibrillation: Secondary | ICD-10-CM | POA: Diagnosis not present

## 2016-01-08 DIAGNOSIS — Z7901 Long term (current) use of anticoagulants: Secondary | ICD-10-CM | POA: Diagnosis not present

## 2016-01-08 LAB — PROTIME-INR
INR: 1.87
PROTHROMBIN TIME: 21.7 s — AB (ref 11.4–15.2)

## 2016-01-10 ENCOUNTER — Encounter (HOSPITAL_COMMUNITY)
Admission: RE | Admit: 2016-01-10 | Discharge: 2016-01-10 | Disposition: A | Discharge status: Home / Self Care | Payer: Medicare Other | Source: Skilled Nursing Facility | Attending: Internal Medicine | Admitting: Internal Medicine

## 2016-01-10 DIAGNOSIS — I5032 Chronic diastolic (congestive) heart failure: Secondary | ICD-10-CM | POA: Diagnosis not present

## 2016-01-10 DIAGNOSIS — I482 Chronic atrial fibrillation: Secondary | ICD-10-CM | POA: Diagnosis not present

## 2016-01-10 DIAGNOSIS — Z7901 Long term (current) use of anticoagulants: Secondary | ICD-10-CM | POA: Diagnosis not present

## 2016-01-10 LAB — PROTIME-INR
INR: 1.52
Prothrombin Time: 18.5 seconds — ABNORMAL HIGH (ref 11.4–15.2)

## 2016-01-11 ENCOUNTER — Encounter (HOSPITAL_COMMUNITY)
Admission: RE | Admit: 2016-01-11 | Discharge: 2016-01-11 | Disposition: A | Discharge status: Home / Self Care | Payer: Medicare Other | Source: Skilled Nursing Facility | Attending: Internal Medicine | Admitting: Internal Medicine

## 2016-01-11 DIAGNOSIS — I5032 Chronic diastolic (congestive) heart failure: Secondary | ICD-10-CM | POA: Diagnosis not present

## 2016-01-11 DIAGNOSIS — I482 Chronic atrial fibrillation: Secondary | ICD-10-CM | POA: Diagnosis not present

## 2016-01-11 DIAGNOSIS — Z7901 Long term (current) use of anticoagulants: Secondary | ICD-10-CM | POA: Diagnosis not present

## 2016-01-11 LAB — PROTIME-INR
INR: 1.45
PROTHROMBIN TIME: 17.8 s — AB (ref 11.4–15.2)

## 2016-01-13 ENCOUNTER — Encounter: Payer: Self-pay | Admitting: Internal Medicine

## 2016-01-13 ENCOUNTER — Encounter (HOSPITAL_COMMUNITY)
Admission: RE | Admit: 2016-01-13 | Discharge: 2016-01-13 | Disposition: A | Discharge status: Home / Self Care | Payer: Medicare Other | Source: Skilled Nursing Facility | Attending: Internal Medicine | Admitting: Internal Medicine

## 2016-01-13 ENCOUNTER — Non-Acute Institutional Stay (SKILLED_NURSING_FACILITY): Payer: Medicare Other | Admitting: Internal Medicine

## 2016-01-13 DIAGNOSIS — Z5181 Encounter for therapeutic drug level monitoring: Secondary | ICD-10-CM | POA: Diagnosis not present

## 2016-01-13 DIAGNOSIS — I480 Paroxysmal atrial fibrillation: Secondary | ICD-10-CM

## 2016-01-13 DIAGNOSIS — I714 Abdominal aortic aneurysm, without rupture, unspecified: Secondary | ICD-10-CM

## 2016-01-13 DIAGNOSIS — I5032 Chronic diastolic (congestive) heart failure: Secondary | ICD-10-CM | POA: Diagnosis not present

## 2016-01-13 DIAGNOSIS — J42 Unspecified chronic bronchitis: Secondary | ICD-10-CM

## 2016-01-13 DIAGNOSIS — Z7901 Long term (current) use of anticoagulants: Secondary | ICD-10-CM | POA: Diagnosis not present

## 2016-01-13 DIAGNOSIS — I482 Chronic atrial fibrillation: Secondary | ICD-10-CM | POA: Diagnosis not present

## 2016-01-13 DIAGNOSIS — F068 Other specified mental disorders due to known physiological condition: Secondary | ICD-10-CM | POA: Diagnosis not present

## 2016-01-13 DIAGNOSIS — F0390 Unspecified dementia without behavioral disturbance: Secondary | ICD-10-CM

## 2016-01-13 LAB — PROTIME-INR
INR: 1.58
Prothrombin Time: 19.1 seconds — ABNORMAL HIGH (ref 11.4–15.2)

## 2016-01-13 NOTE — Progress Notes (Deleted)
Location:   [facility] Room Number: 121/W Place of Service:  SNF [phone] Provider:  Abigail MiyamotoArlo Aadi Mays  Joel Hopper, MD  Patient Care Team: Pecola LawlessWilliam F Hopper, MD as PCP - General (Internal Medicine) Bjorn PippinJohn Wrenn, MD as Attending Physician (Urology)  Extended Emergency Contact Information Primary Emergency Contact: [name],[name] Address: [address]          [place], [place] [address] [place] Home Phone: [phone] Mobile Phone: [phone] Relation: Daughter Secondary Emergency Contact: Reisner,[name] Address: [address]          [place], [place] [address] [place] Home Phone: [phone] Mobile Phone: [phone] Relation: Son  Code Status:  DNR Goals of care: Advanced Directive information Advanced Directives 01/13/2016  Does patient have an advance directive? Yes  Type of Advance Directive Out of facility DNR (pink MOST or yellow form)  Does patient want to make changes to advanced directive? No - Patient declined  Copy of advanced directive(s) in chart? Yes  Would patient like information on creating an advanced directive? -  Pre-existing out of facility DNR order (yellow form or pink MOST form) -     Chief Complaint  Patient presents with  . Acute Visit    Anticoagulation    HPI:  Pt is a 82 y.o. male seen today for an acute visit for    Past Medical History:  Diagnosis Date  . AAA (abdominal aortic aneurysm) (HCC)   . AAA (abdominal aortic aneurysm) without rupture (HCC) 10/06/2012  . Anticoagulation goal of INR 2 to 3 06/01/2013  . Arthritis    "probably on the right leg/hip" (09/20/2013)  . Atrial fibrillation (HCC)   . B12 deficiency 09/20/2013  . Benign localized hyperplasia of prostate with urinary retention 12/23/2014  . Bladder calculus 12/23/2014   2.6cm and present since at least 2011 when it was about 1.6cm.   [name]. BPH (benign prostatic hyperplasia)   . Bronchospasm   . Cardiomyopathy (HCC) 10/15/2014  .  Cellulitis   . CHF (congestive heart failure) (HCC) 09/26/2013  . Chronic venous insufficiency 10/06/2012  . Cognitive impairment   . COPD (chronic obstructive pulmonary disease) (HCC)   . Daily headache   . Dementia    "don't know exactly what kind" (09/20/2013)  . DVT (deep venous thrombosis) (HCC) 05/04/2013   "LLE"  . E. coli UTI 11/17/15   Cipro X 7 days  . Edema-Bilat Leg and foot 03/07/2013  . Elevated PSA   . Fall at home September 18, 2013  . Glaucoma, both eyes   . Kidney stones   . Multifactorial dementia 10/06/2012  . Nicotine dependence   . Obesity, unspecified 10/06/2012  . Peripheral vascular disease (HCC) 09/20/2012  . Pyelonephritis, acute 12/22/2014  . Ulcer   . Venous insufficiency   . Weakness 09/18/2013  . Wound cellulitis    let ankle wound size of half dollar [facility] on 03/25/2015   Past Surgical History:  Procedure Laterality Date  . BACK SURGERY    . CATARACT EXTRACTION W/ INTRAOCULAR LENS  IMPLANT, BILATERAL Bilateral   . CHOLECYSTECTOMY  2011   Gall Bladder  . CYSTOSCOPY W/ STONE MANIPULATION  1970's   "once"  . CYSTOSCOPY WITH LITHOLAPAXY N/A 03/28/2015   Procedure: CYSTOSCOPY WITH LITHOLAPAXY;  Surgeon: Malen GauzePatrick L McKenzie, MD;  Location: [facility];  Service: Urology;  Laterality: N/A;  . EYE SURGERY    . FEMUR FRACTURE SURGERY Right 04/1985  . HIP FRACTURE  SURGERY Right 2009  . INSITU PERCUTANEOUS PINNING FEMUR Right 1986  . POSTERIOR LAMINECTOMY / DECOMPRESSION LUMBAR SPINE  2004   Decompressive laminectomy unilaterally on the right at L4-5 with  . SPINE SURGERY    . TRANSURETHRAL RESECTION OF PROSTATE N/A 03/28/2015   Procedure: TRANSURETHRAL RESECTION OF THE PROSTATE ;  Surgeon: Malen GauzePatrick L McKenzie, MD;  Location: [facility];  Service: Urology;  Laterality: N/A;    Allergies  Allergen Reactions  . Oxycodone Hcl Other (See Comments)     makes pt "wild"  . Propoxyphene N-Acetaminophen Other (See Comments)     Makes pt. "wild"        Medication List    Notice   This visit is during an admission. Changes to the med list made in this visit will be reflected in the After Visit Summary of the admission.     Review of Systems  Immunization History  Administered Date(s) Administered  . Influenza,inj,Quad PF,36+ Mos 03/20/2013, 03/23/2014, 02/11/2015  . PPD Test 12/26/2014, 01/09/2015  . Pneumococcal Polysaccharide-23 03/02/2007, 02/11/2015  . Td 12/30/1996   Pertinent  Health Maintenance Due  Topic Date Due  . INFLUENZA VACCINE  12/31/2015  . PNA vac Low Risk Adult (2 of 2 - PCV13) 02/11/2016   Fall Risk  01/04/2015 04/18/2014 08/15/2013  Falls in the past year? Yes Yes Yes  Number falls in past yr: 2 or more 1 2 or more  Injury with Fall? No Yes -  Risk Factor Category  - High Fall Risk High Fall Risk  Risk for fall due to : Impaired balance/gait;Impaired mobility Impaired mobility;Mental status change History of fall(s);Impaired balance/gait;Impaired mobility;Mental status change   Functional Status Survey:    Vitals:   01/13/16 1355  BP: (!) 169/62  Pulse: 66  Resp: (!) 21  Temp: 97.9 F (36.6 Mays)  TempSrc: Oral  Weight: (!) 306 lb (138.8 kg)   Body mass index is 37.25 kg/m. Physical Exam  Labs reviewed:  Recent Labs  11/20/15 0710 11/29/15 0700 12/16/15 0730  NA 143 141 141  K 4.2 4.1 4.3  CL 111 109 108  CO2 30 27 26   GLUCOSE 98 90 88  BUN 28* 23* 28*  CREATININE 1.21 1.11 1.04  CALCIUM 8.4* 8.3* 8.5*    Recent Labs  10/24/15 0705 11/04/15 0530 11/14/15 2312  AST 15 13* 17  ALT 14* 12* 14*  ALKPHOS 76 65 59  BILITOT 0.3 0.4 0.6  PROT 6.6 6.3* 6.5  ALBUMIN 3.1* 2.9* 3.1*    Recent Labs  10/24/15 0705 11/04/15 0530 11/08/15 0715 11/14/15 2312  WBC 7.6 8.1 7.2 8.7  NEUTROABS 3.9  --  3.2 5.6  HGB 10.9* 10.2* 11.4* 11.2*  HCT 35.2* 32.6* 36.6* 34.7*  MCV 84.8 85.3 85.5 84.6  PLT 237 209 254 267   Lab Results  Component Value Date   TSH 1.960 09/22/2013   Lab  Results  Component Value Date   HGBA1C 6.3 (H) 10/24/2015   Lab Results  Component Value Date   CHOL 157 10/06/2012   HDL 34 (L) 10/06/2012   LDLCALC 96 10/06/2012   TRIG 135 10/06/2012   CHOLHDL 4.6 10/06/2012    Significant Diagnostic Results in last 30 days:  No results found.  Assessment/Plan  .      Joel SheerLuster, [name], [place] [phone]

## 2016-01-13 NOTE — Progress Notes (Signed)
Location:   [facility] Room Number: 121/W Place of Service:  SNF [phone]) Provider:  Abigail Miyamoto, MD  Patient Care Team: Pecola Lawless, MD as PCP - General (Internal Medicine) [name], MD as Attending Physician (Urology)  Extended Emergency Contact Information Primary Emergency Contact: [name],[name] Address: [address]          [place], [place] [address] [name] of [place] Home Phone: [phone] Mobile Phone: [phone] Relation: Daughter Secondary Emergency Contact: Amaral,[name] Address: [address]          [place], [place] [address] [name] of [place] Home Phone: [phone] Mobile Phone: [phone] Relation: Son  Code Status: DNR  Goals of care: Advanced Directive information Advanced Directives 01/13/2016  Does patient have an advance directive? Yes  Type of Advance Directive Out of facility DNR (pink MOST or yellow form)  Does patient want to make changes to advanced directive? No - Patient declined  Copy of advanced directive(s) in chart? Yes  Would patient like information on creating an advanced directive? -  Pre-existing out of facility DNR order (yellow form or pink MOST form) -     Chief Complaint  Patient presents with  . Acute Visit    Anticoagulation  Management with history of A. fib.  Also medical management of chronic medical issues including atrial fibrillation-hypertension-COPD-dementia with behaviors-BPH-  HPI:  Pt is a 82 y.o. male seen today for medical management of chronic diseases.  As noted above.  He has been relatively stable recently was treated for cold-like symptoms he says they still persists somewhat but he is not complaining of any increased shortness of breath but still has a cough at times.  He does have a history of COPD is on routine well nebs 3 times a day as well as when necessary as needed.  He is on chronic Coumadin with a history of atrial fibrillation and distant  history apparently of DVT-there was no attempt to switch him to Xarelto but apparently there was a drug interaction which negated this-he has been restarted on his Coumadin and INR currently at 1.58-this appears to be slowly trending up there has been some variation he is on 6 mg of Coumadin although has been supratherapeutic in the past so we will try to keep a very close eye on this I suspect he will shortly become therapeutic.  Patient has had some behaviors and is on Depakote now he is seen by psychiatric services and apparently these have improved.  He also has a history of BPH does have a chronic indwelling Foley catheter and continues on Hiprex--   He does have a history of infrarenal abdominal aortic aneurysm most recently found to be 4.4 cm in diameter there is recommendation to do a follow-up ultrasound approximately a year which should be due about this time.     Past Medical History:  Diagnosis Date  . AAA (abdominal aortic aneurysm) (HCC)   . AAA (abdominal aortic aneurysm) without rupture (HCC) 10/06/2012  . Anticoagulation goal of INR 2 to 3 06/01/2013  . Arthritis    "probably on the right leg/hip" (09/20/2013)  . Atrial fibrillation (HCC)   . B12 deficiency 09/20/2013  . Benign localized hyperplasia of prostate with urinary retention 12/23/2014  . Bladder calculus 12/23/2014   2.6cm and present since at least 2011 when it was about 1.6cm.   [name] BPH (benign prostatic hyperplasia)   . Bronchospasm   . Cardiomyopathy (HCC) 10/15/2014  . Cellulitis   .  CHF (congestive heart failure) (HCC) 09/26/2013  . Chronic venous insufficiency 10/06/2012  . Cognitive impairment   . COPD (chronic obstructive pulmonary disease) (HCC)   . Daily headache   . Dementia    "don't know exactly what kind" (09/20/2013)  . DVT (deep venous thrombosis) (HCC) 05/04/2013   "LLE"  . E. coli UTI 11/17/15   Cipro X 7 days  . Edema-Bilat Leg and foot 03/07/2013  . Elevated PSA   . Fall at home September 18, 2013    . Glaucoma, both eyes   . Kidney stones   . Multifactorial dementia 10/06/2012  . Nicotine dependence   . Obesity, unspecified 10/06/2012  . Peripheral vascular disease (HCC) 09/20/2012  . Pyelonephritis, acute 12/22/2014  . Ulcer   . Venous insufficiency   . Weakness 09/18/2013  . Wound cellulitis    let ankle wound size of half dollar per [facility] on 03/25/2015   Past Surgical History:  Procedure Laterality Date  . BACK SURGERY    . CATARACT EXTRACTION W/ INTRAOCULAR LENS  IMPLANT, BILATERAL Bilateral   . CHOLECYSTECTOMY  2011   Gall Bladder  . CYSTOSCOPY W/ STONE MANIPULATION  1970's   "once"  . CYSTOSCOPY WITH LITHOLAPAXY N/A 03/28/2015   Procedure: CYSTOSCOPY WITH LITHOLAPAXY;  Surgeon: Malen GauzePatrick L McKenzie, MD;  Location: [facility];  Service: Urology;  Laterality: N/A;  . EYE SURGERY    . FEMUR FRACTURE SURGERY Right 04/1985  . HIP FRACTURE SURGERY Right 2009  . INSITU PERCUTANEOUS PINNING FEMUR Right 1986  . POSTERIOR LAMINECTOMY / DECOMPRESSION LUMBAR SPINE  2004   Decompressive laminectomy unilaterally on the right at L4-5 with  . SPINE SURGERY    . TRANSURETHRAL RESECTION OF PROSTATE N/A 03/28/2015   Procedure: TRANSURETHRAL RESECTION OF THE PROSTATE ;  Surgeon: Malen GauzePatrick L McKenzie, MD;  Location: [facility];  Service: Urology;  Laterality: N/A;    Allergies  Allergen Reactions  . Oxycodone Hcl Other (See Comments)     makes pt "wild"  . Propoxyphene N-Acetaminophen Other (See Comments)     Makes pt. "wild"    Current Outpatient Prescriptions on File Prior to Visit  Medication Sig Dispense Refill  . acetaminophen (TYLENOL) 500 MG tablet Take 1,000 mg by mouth every 6 (six) hours as needed for mild pain or fever.     . Amino Acids-Protein Hydrolys (FEEDING SUPPLEMENT, PRO-STAT SUGAR FREE 64,) LIQD Take 30 mLs by mouth 2 (two) times daily between meals.     [name]. diltiazem (CARDIZEM CD) 120 MG 24 hr capsule Take 120 mg by mouth every morning.     . divalproex  (DEPAKOTE) 250 MG DR tablet Take 250 mg by mouth 2 (two) times daily.    [name]. doxazosin (CARDURA) 8 MG tablet Take 8 mg by mouth every morning.     . Fluticasone-Salmeterol (ADVAIR) 250-50 MCG/DOSE AEPB Inhale 1 puff into the lungs 2 (two) times daily.    [name]. ipratropium-albuterol (DUONEB) 0.5-2.5 (3) MG/3ML SOLN 3ml via Neb inhalation three times a day for congestion scheduled and 4 times daily as needed for SOB or congestion    . latanoprost (XALATAN) 0.005 % ophthalmic solution Place 1 drop into both eyes at bedtime.     . methenamine (HIPREX) 1 g tablet Take 1 g by mouth 2 (two) times daily with a meal.    . metoprolol tartrate (LOPRESSOR) 25 MG tablet Take 0.5 tablets (12.5 mg total) by mouth 2 (two) times daily. 30 tablet 0  .  sertraline (ZOLOFT) 25 MG tablet Take three tablets by mouth once daily    . triamcinolone cream (KENALOG) 0.1 % Apply triamcinolone 0.1 % and 1/2 cetaphil cream  to both Rt. and Lt. leg before wrapping on Mon,Wed,Fri.     No current facility-administered medications on file prior to visit.     Review of Systems  In general is not complaining of any fever or chills --weight appears to be stable  Skin does not complain of rashes or itching--  Does have chronic venous stasis changes lower extremities his legs currently are wrapped.  Ears eyes nose mouth throat does not complain of sore throat or visual changes.  Respiratory does not complain of shortness breath  but does have a cough at times  Cardiac denies chest pain does again have significant lymphedema edema. His legs are usually wrapped  GI does not complain of nausea vomiting diarrhea constipation or abdominal discomfort.  GU does have a chronic indwelling Foley catheter with history of urinary obstruction BPH.  Does not complain of dysuria.  Muscle skeletal has lower extremity weakness but does not complain of pain currently.  Neurologic is not complaining of dizziness headache or syncopal-type  feelings.   Psych does have some cognitive deficits but appears to be doing well with supportive care at times will have behaviors but these have stabilized it appears    Immunization History  Administered Date(s) Administered  . Influenza,inj,Quad PF,36+ Mos 03/20/2013, 03/23/2014, 02/11/2015  . PPD Test 12/26/2014, 01/09/2015  . Pneumococcal Polysaccharide-23 03/02/2007, 02/11/2015  . Td 12/30/1996   Pertinent  Health Maintenance Due  Topic Date Due  . INFLUENZA VACCINE  12/31/2015  . PNA vac Low Risk Adult (2 of 2 - PCV13) 02/11/2016   Fall Risk  01/04/2015 04/18/2014 08/15/2013  Falls in the past year? Yes Yes Yes  Number falls in past yr: 2 or more 1 2 or more  Injury with Fall? No Yes -  Risk Factor Category  - High Fall Risk High Fall Risk  Risk for fall due to : Impaired balance/gait;Impaired mobility Impaired mobility;Mental status change History of fall(s);Impaired balance/gait;Impaired mobility;Mental status change   Functional Status Survey:     Body mass index is 37.25 kg/m. Physical Exam MG 99.1 pulse 78 respirations 16 blood pressure recently 158/74-150/68-122/62-114/70 weight is stable at 304  In general this is a pleasant elderly Male in no distress  His skin is warm and dry continues with wrapping of his lower legs bilaterally.  f  Eyes pupils appear reactive to light sclerae and conjunctivae were clear visual acuity appears grossly intact.  Oropharynx clear mucous membranes moist.  Chest does have some coarse breath sounds on expiration there is no labored breathing   Heart is rate and rate and rhythm with distant heart sounds-legs are currently wrapped I do not see increased edema from baseline  Abdomen is obese soft nontender positive bowel sounds.  GU has a chronic indwelling Foley catheter draining amber colored urine.  Muscle skeletal does have lower extremity weakness this is baseline moves upper extremities with baseline  strength.--He does have lower legs wrapped bilaterally  Neurologic is grossly intact to speech is clear no lateralizing findings.  Psyche is oriented to self pleasant appropriate he does have confusion dementia I would classify as moderate  Labs reviewed:  Recent Labs  11/20/15 0710 11/29/15 0700 12/16/15 0730  NA 143 141 141  K 4.2 4.1 4.3  CL 111 109 108  CO2 30 27 26   GLUCOSE  98 90 88  BUN 28* 23* 28*  CREATININE 1.21 1.11 1.04  CALCIUM 8.4* 8.3* 8.5*    Recent Labs  10/24/15 0705 11/04/15 0530 11/14/15 2312  AST 15 13* 17  ALT 14* 12* 14*  ALKPHOS 76 65 59  BILITOT 0.3 0.4 0.6  PROT 6.6 6.3* 6.5  ALBUMIN 3.1* 2.9* 3.1*    Recent Labs  10/24/15 0705 11/04/15 0530 11/08/15 0715 11/14/15 2312  WBC 7.6 8.1 7.2 8.7  NEUTROABS 3.9  --  3.2 5.6  HGB 10.9* 10.2* 11.4* 11.2*  HCT 35.2* 32.6* 36.6* 34.7*  MCV 84.8 85.3 85.5 84.6  PLT 237 209 254 267   Lab Results  Component Value Date   TSH 1.960 09/22/2013   Lab Results  Component Value Date   HGBA1C 6.3 (H) 10/24/2015   Lab Results  Component Value Date   CHOL 157 10/06/2012   HDL 34 (L) 10/06/2012   LDLCALC 96 10/06/2012   TRIG 135 10/06/2012   CHOLHDL 4.6 10/06/2012    Significant Diagnostic Results in last 30 days:  No results found.  Assessment/Plan 1 history COPD he continues on Advair as well as duo nebs routine as well as when necessary nebulizers-continues to have somewhat cold-like symptoms and some chest congestion-there may be an element of chronicity to this but with the lingering cold would like to get a chest x-ray to rule out anything more acute-monitor vital signs pulse ox every shift for 72 hours.  We'll continue Mucinex 600 mg twice a day for 7 days.  #2 anticoagulation management with history of A. fib and distant history DVT-INR appears to be slowly trending up-he is at risk for going supratherapeutic in the past-will continue the 6 mg a day but will check an INR tomorrow  this continues to go up will decrease the Coumadin dose-there is no evidence of increased bruising or bleeding.  His heart rate appears controlled on Lopressor as well as Cardizem.  #3-hypertension-he does have variable systolics as noted above but I do not see consistent elevations at this point will monitor he is on Cardizem as well as Lopressor.  #4-history of dementia with behaviors this appears relatively well controlled he has had some history of inappropriate comments especially to male staff members but this has improved apparently he is on Depakote and followed by psychiatric services.  #5 history of lymphedema and leg wounds this is followed closely by wound care legs are currently wrapped per staff this is stable but will have to watch at times she does receive an oral antibiotic but this is been stable now for a while.-She is receiving triamcinolone cream 3 times a week  #6 history of infrarenal abdominal aortic aneurysm-again he did have a study done July 2016 which showed a 4.4 cm aneurysm-will discuss this with nursing to see follow-up on this with vascular-recommendation was for an ultrasound  in approximately a year which would be approximately this time  #7 history renal insufficiency creatinine 1.11 on lab done on 11/29/2015. Relatively stable we will update this.  Number 8--history of depression he is on Zoloft this appears relatively stable again he is followed by psychiatric services.  #9 anemia hemoglobin of 11.2 on lab done on 11/14/2015 appears to be relatively baseline Will update this as well.  #10-history of BPH she is followed by urology has been started on Hiprex continues with an indwelling Foley catheter  [id number]-of note greater than 40 minutes spent assessing patient-reviewing his chart-reviewing his labs-and coordinating and formulating  a plan of care for numerous diagnoses-of note greater than 50% of time spent coordinating plan of care         :

## 2016-01-14 ENCOUNTER — Encounter (HOSPITAL_COMMUNITY)
Admission: RE | Admit: 2016-01-14 | Discharge: 2016-01-14 | Disposition: A | Discharge status: Home / Self Care | Payer: Medicare Other | Source: Skilled Nursing Facility | Attending: Internal Medicine | Admitting: Internal Medicine

## 2016-01-14 DIAGNOSIS — I482 Chronic atrial fibrillation: Secondary | ICD-10-CM | POA: Diagnosis not present

## 2016-01-14 DIAGNOSIS — I5032 Chronic diastolic (congestive) heart failure: Secondary | ICD-10-CM | POA: Diagnosis not present

## 2016-01-14 DIAGNOSIS — Z7901 Long term (current) use of anticoagulants: Secondary | ICD-10-CM | POA: Diagnosis not present

## 2016-01-14 DIAGNOSIS — R0989 Other specified symptoms and signs involving the circulatory and respiratory systems: Secondary | ICD-10-CM | POA: Diagnosis not present

## 2016-01-14 LAB — PROTIME-INR
INR: 1.63
PROTHROMBIN TIME: 19.5 s — AB (ref 11.4–15.2)

## 2016-01-15 ENCOUNTER — Encounter (HOSPITAL_COMMUNITY)
Admission: RE | Admit: 2016-01-15 | Discharge: 2016-01-15 | Disposition: A | Discharge status: Home / Self Care | Payer: Medicare Other | Source: Skilled Nursing Facility | Attending: Internal Medicine | Admitting: Internal Medicine

## 2016-01-15 DIAGNOSIS — I5032 Chronic diastolic (congestive) heart failure: Secondary | ICD-10-CM | POA: Diagnosis not present

## 2016-01-15 DIAGNOSIS — Z7901 Long term (current) use of anticoagulants: Secondary | ICD-10-CM | POA: Diagnosis not present

## 2016-01-15 DIAGNOSIS — I482 Chronic atrial fibrillation: Secondary | ICD-10-CM | POA: Diagnosis not present

## 2016-01-15 LAB — CBC WITH DIFFERENTIAL/PLATELET
Basophils Absolute: 0.1 10*3/uL (ref 0.0–0.1)
Basophils Relative: 1 %
EOS ABS: 1.6 10*3/uL — AB (ref 0.0–0.7)
Eosinophils Relative: 21 %
HCT: 33.2 % — ABNORMAL LOW (ref 39.0–52.0)
HEMOGLOBIN: 10.6 g/dL — AB (ref 13.0–17.0)
LYMPHS ABS: 1.9 10*3/uL (ref 0.7–4.0)
LYMPHS PCT: 25 %
MCH: 27.7 pg (ref 26.0–34.0)
MCHC: 31.9 g/dL (ref 30.0–36.0)
MCV: 86.9 fL (ref 78.0–100.0)
Monocytes Absolute: 1 10*3/uL (ref 0.1–1.0)
Monocytes Relative: 13 %
NEUTROS ABS: 3.1 10*3/uL (ref 1.7–7.7)
NEUTROS PCT: 40 %
Platelets: 252 10*3/uL (ref 150–400)
RBC: 3.82 MIL/uL — AB (ref 4.22–5.81)
RDW: 17.5 % — ABNORMAL HIGH (ref 11.5–15.5)
WBC: 7.6 10*3/uL (ref 4.0–10.5)

## 2016-01-15 LAB — BASIC METABOLIC PANEL
Anion gap: 3 — ABNORMAL LOW (ref 5–15)
BUN: 29 mg/dL — AB (ref 6–20)
CHLORIDE: 110 mmol/L (ref 101–111)
CO2: 29 mmol/L (ref 22–32)
Calcium: 8.7 mg/dL — ABNORMAL LOW (ref 8.9–10.3)
Creatinine, Ser: 1.3 mg/dL — ABNORMAL HIGH (ref 0.61–1.24)
GFR calc Af Amer: 57 mL/min — ABNORMAL LOW (ref >60)
GFR calc non Af Amer: 49 mL/min — ABNORMAL LOW (ref >60)
Glucose, Bld: 103 mg/dL — ABNORMAL HIGH (ref 65–99)
POTASSIUM: 4.7 mmol/L (ref 3.5–5.1)
SODIUM: 142 mmol/L (ref 135–145)

## 2016-01-20 ENCOUNTER — Other Ambulatory Visit (HOSPITAL_COMMUNITY)
Admission: RE | Admit: 2016-01-20 | Discharge: 2016-01-20 | Disposition: A | Discharge status: Home / Self Care | Payer: Medicare Other | Source: Skilled Nursing Facility | Attending: Internal Medicine | Admitting: Internal Medicine

## 2016-01-20 DIAGNOSIS — I5032 Chronic diastolic (congestive) heart failure: Secondary | ICD-10-CM | POA: Diagnosis not present

## 2016-01-20 LAB — PROTIME-INR
INR: 3.38
Prothrombin Time: 34.9 seconds — ABNORMAL HIGH (ref 11.4–15.2)

## 2016-01-21 ENCOUNTER — Encounter (HOSPITAL_COMMUNITY)
Admission: RE | Admit: 2016-01-21 | Discharge: 2016-01-21 | Disposition: A | Discharge status: Home / Self Care | Payer: Medicare Other | Source: Skilled Nursing Facility | Attending: *Deleted | Admitting: *Deleted

## 2016-01-21 DIAGNOSIS — I482 Chronic atrial fibrillation: Secondary | ICD-10-CM | POA: Diagnosis not present

## 2016-01-21 DIAGNOSIS — I5032 Chronic diastolic (congestive) heart failure: Secondary | ICD-10-CM | POA: Diagnosis not present

## 2016-01-21 DIAGNOSIS — Z7901 Long term (current) use of anticoagulants: Secondary | ICD-10-CM | POA: Diagnosis not present

## 2016-01-21 LAB — PROTIME-INR
INR: 3.42
PROTHROMBIN TIME: 35.3 s — AB (ref 11.4–15.2)

## 2016-01-22 ENCOUNTER — Encounter (HOSPITAL_COMMUNITY)
Admission: RE | Admit: 2016-01-22 | Discharge: 2016-01-22 | Disposition: A | Discharge status: Home / Self Care | Payer: Medicare Other | Source: Skilled Nursing Facility | Attending: *Deleted | Admitting: *Deleted

## 2016-01-22 DIAGNOSIS — Z7901 Long term (current) use of anticoagulants: Secondary | ICD-10-CM | POA: Diagnosis not present

## 2016-01-22 DIAGNOSIS — I5032 Chronic diastolic (congestive) heart failure: Secondary | ICD-10-CM | POA: Diagnosis not present

## 2016-01-22 DIAGNOSIS — I482 Chronic atrial fibrillation: Secondary | ICD-10-CM | POA: Diagnosis not present

## 2016-01-22 LAB — PROTIME-INR
INR: 3.65
PROTHROMBIN TIME: 37.2 s — AB (ref 11.4–15.2)

## 2016-01-23 DIAGNOSIS — I739 Peripheral vascular disease, unspecified: Secondary | ICD-10-CM | POA: Diagnosis not present

## 2016-01-23 DIAGNOSIS — L97322 Non-pressure chronic ulcer of left ankle with fat layer exposed: Secondary | ICD-10-CM | POA: Diagnosis not present

## 2016-01-23 DIAGNOSIS — I5032 Chronic diastolic (congestive) heart failure: Secondary | ICD-10-CM | POA: Diagnosis not present

## 2016-01-24 ENCOUNTER — Encounter (HOSPITAL_COMMUNITY)
Admission: RE | Admit: 2016-01-24 | Discharge: 2016-01-24 | Disposition: A | Discharge status: Home / Self Care | Payer: Medicare Other | Source: Skilled Nursing Facility | Attending: *Deleted | Admitting: *Deleted

## 2016-01-24 DIAGNOSIS — I5032 Chronic diastolic (congestive) heart failure: Secondary | ICD-10-CM | POA: Diagnosis not present

## 2016-01-24 DIAGNOSIS — Z7901 Long term (current) use of anticoagulants: Secondary | ICD-10-CM | POA: Diagnosis not present

## 2016-01-24 DIAGNOSIS — I482 Chronic atrial fibrillation: Secondary | ICD-10-CM | POA: Diagnosis not present

## 2016-01-24 LAB — PROTIME-INR
INR: 2.26
Prothrombin Time: 25.3 seconds — ABNORMAL HIGH (ref 11.4–15.2)

## 2016-01-28 ENCOUNTER — Other Ambulatory Visit (HOSPITAL_COMMUNITY)
Admission: RE | Admit: 2016-01-28 | Discharge: 2016-01-28 | Disposition: A | Discharge status: Home / Self Care | Payer: Medicare Other | Source: Skilled Nursing Facility | Attending: Internal Medicine | Admitting: Internal Medicine

## 2016-01-28 DIAGNOSIS — I5032 Chronic diastolic (congestive) heart failure: Secondary | ICD-10-CM | POA: Diagnosis not present

## 2016-01-28 LAB — PROTIME-INR
INR: 1.55
PROTHROMBIN TIME: 18.7 s — AB (ref 11.4–15.2)

## 2016-01-30 DIAGNOSIS — L97322 Non-pressure chronic ulcer of left ankle with fat layer exposed: Secondary | ICD-10-CM | POA: Diagnosis not present

## 2016-01-30 DIAGNOSIS — I739 Peripheral vascular disease, unspecified: Secondary | ICD-10-CM | POA: Diagnosis not present

## 2016-01-30 DIAGNOSIS — I5032 Chronic diastolic (congestive) heart failure: Secondary | ICD-10-CM | POA: Diagnosis not present

## 2016-01-31 ENCOUNTER — Other Ambulatory Visit (HOSPITAL_COMMUNITY)
Admission: AD | Admit: 2016-01-31 | Discharge: 2016-01-31 | Disposition: A | Discharge status: Home / Self Care | Payer: Medicare Other | Source: Skilled Nursing Facility | Attending: Internal Medicine | Admitting: Internal Medicine

## 2016-01-31 DIAGNOSIS — I5032 Chronic diastolic (congestive) heart failure: Secondary | ICD-10-CM | POA: Insufficient documentation

## 2016-01-31 DIAGNOSIS — Z7901 Long term (current) use of anticoagulants: Secondary | ICD-10-CM | POA: Diagnosis not present

## 2016-01-31 LAB — PROTIME-INR
INR: 1.66
PROTHROMBIN TIME: 19.8 s — AB (ref 11.4–15.2)

## 2016-02-06 ENCOUNTER — Encounter (HOSPITAL_COMMUNITY)
Admission: RE | Admit: 2016-02-06 | Discharge: 2016-02-06 | Disposition: A | Discharge status: Home / Self Care | Payer: Medicare Other | Source: Skilled Nursing Facility | Attending: Internal Medicine | Admitting: Internal Medicine

## 2016-02-06 DIAGNOSIS — I5032 Chronic diastolic (congestive) heart failure: Secondary | ICD-10-CM | POA: Diagnosis not present

## 2016-02-06 DIAGNOSIS — Z7901 Long term (current) use of anticoagulants: Secondary | ICD-10-CM | POA: Diagnosis not present

## 2016-02-06 DIAGNOSIS — F0391 Unspecified dementia with behavioral disturbance: Secondary | ICD-10-CM | POA: Diagnosis present

## 2016-02-06 DIAGNOSIS — I482 Chronic atrial fibrillation: Secondary | ICD-10-CM | POA: Diagnosis not present

## 2016-02-06 DIAGNOSIS — F39 Unspecified mood [affective] disorder: Secondary | ICD-10-CM | POA: Insufficient documentation

## 2016-02-06 LAB — PROTIME-INR
INR: 1.79
Prothrombin Time: 21.1 seconds — ABNORMAL HIGH (ref 11.4–15.2)

## 2016-02-11 ENCOUNTER — Non-Acute Institutional Stay (SKILLED_NURSING_FACILITY): Payer: Medicare Other | Admitting: Internal Medicine

## 2016-02-11 ENCOUNTER — Encounter: Payer: Self-pay | Admitting: Internal Medicine

## 2016-02-11 DIAGNOSIS — J209 Acute bronchitis, unspecified: Secondary | ICD-10-CM | POA: Diagnosis not present

## 2016-02-11 DIAGNOSIS — I480 Paroxysmal atrial fibrillation: Secondary | ICD-10-CM

## 2016-02-11 DIAGNOSIS — F0390 Unspecified dementia without behavioral disturbance: Secondary | ICD-10-CM

## 2016-02-11 DIAGNOSIS — J42 Unspecified chronic bronchitis: Secondary | ICD-10-CM

## 2016-02-11 DIAGNOSIS — F068 Other specified mental disorders due to known physiological condition: Secondary | ICD-10-CM | POA: Diagnosis not present

## 2016-02-11 NOTE — Progress Notes (Signed)
Location:   [facility] Room Number: 121/W Place of Service:  SNF [phone]) Provider:  Clearnce Sorrel, MD  Patient Care Team: Pecola Lawless, MD as PCP - General (Internal Medicine) [name], MD as Attending Physician (Urology)  Extended Emergency Contact Information Primary Emergency Contact: [name],[name] Address: [address]          [place], [place] [address] [name] of [place] Home Phone: [phone] Mobile Phone: [phone] Relation: Daughter Secondary Emergency Contact: Chipley,[name] Address: [address]          [place], [place] [address] [name] of [place] Home Phone: [phone] Mobile Phone: [phone] Relation: Son  Code Status:  DNR Goals of care: Advanced Directive information Advanced Directives 02/11/2016  Does patient have an advance directive? Yes  Type of Advance Directive Out of facility DNR (pink MOST or yellow form)  Does patient want to make changes to advanced directive? No - Patient declined  Copy of advanced directive(s) in chart? Yes  Would patient like information on creating an advanced directive? -  Pre-existing out of facility DNR order (yellow form or pink MOST form) -     Chief Complaint  Patient presents with  . Acute Visit    Coughing up green mucus    HPI:  Pt is a 82 y.o. male seen today for an acute visit for Cough with Green sputum. Patient was unable to give me any detail history due to his Dementia. He denies any SOB or Chest pain. He denied coughing also but started coughing in front of me. Unable to obtain any further history from him.   Past Medical History:  Diagnosis Date  . AAA (abdominal aortic aneurysm) (HCC)   . AAA (abdominal aortic aneurysm) without rupture (HCC) 10/06/2012  . Anticoagulation goal of INR 2 to 3 06/01/2013  . Arthritis    "probably on the right leg/hip" (09/20/2013)  . Atrial fibrillation (HCC)   . B12 deficiency 09/20/2013  . Benign localized hyperplasia  of prostate with urinary retention 12/23/2014  . Bladder calculus 12/23/2014   2.6cm and present since at least 2011 when it was about 1.6cm.   [name] BPH (benign prostatic hyperplasia)   . Bronchospasm   . Cardiomyopathy (HCC) 10/15/2014  . Cellulitis   . CHF (congestive heart failure) (HCC) 09/26/2013  . Chronic venous insufficiency 10/06/2012  . Cognitive impairment   . COPD (chronic obstructive pulmonary disease) (HCC)   . Daily headache   . Dementia    "don't know exactly what kind" (09/20/2013)  . DVT (deep venous thrombosis) (HCC) 05/04/2013   "LLE"  . E. coli UTI 11/17/15   Cipro X 7 days  . Edema-Bilat Leg and foot 03/07/2013  . Elevated PSA   . Fall at home September 18, 2013  . Glaucoma, both eyes   . Kidney stones   . Multifactorial dementia 10/06/2012  . Nicotine dependence   . Obesity, unspecified 10/06/2012  . Peripheral vascular disease (HCC) 09/20/2012  . Pyelonephritis, acute 12/22/2014  . Ulcer   . Venous insufficiency   . Weakness 09/18/2013  . Wound cellulitis    let ankle wound size of half dollar per nurse at [facility] on 03/25/2015   Past Surgical History:  Procedure Laterality Date  . BACK SURGERY    . CATARACT EXTRACTION W/ INTRAOCULAR LENS  IMPLANT, BILATERAL Bilateral   . CHOLECYSTECTOMY  2011   Gall Bladder  . CYSTOSCOPY W/ STONE MANIPULATION  1970's   "once"  .  CYSTOSCOPY WITH LITHOLAPAXY N/A 03/28/2015   Procedure: CYSTOSCOPY WITH LITHOLAPAXY;  Surgeon: Malen Gauze, MD;  Location: [facility];  Service: Urology;  Laterality: N/A;  . EYE SURGERY    . FEMUR FRACTURE SURGERY Right 04/1985  . HIP FRACTURE SURGERY Right 2009  . INSITU PERCUTANEOUS PINNING FEMUR Right 1986  . POSTERIOR LAMINECTOMY / DECOMPRESSION LUMBAR SPINE  2004   Decompressive laminectomy unilaterally on the right at L4-5 with  . SPINE SURGERY    . TRANSURETHRAL RESECTION OF PROSTATE N/A 03/28/2015   Procedure: TRANSURETHRAL RESECTION OF THE PROSTATE ;  Surgeon: Malen Gauze,  MD;  Location: [facility];  Service: Urology;  Laterality: N/A;    Allergies  Allergen Reactions  . Oxycodone Hcl Other (See Comments)     makes pt "wild"  . Propoxyphene N-Acetaminophen Other (See Comments)     Makes pt. "wild"    Current Outpatient Prescriptions on File Prior to Visit  Medication Sig Dispense Refill  . acetaminophen (TYLENOL) 500 MG tablet Take 1,000 mg by mouth every 6 (six) hours as needed for mild pain or fever.     . Amino Acids-Protein Hydrolys (FEEDING SUPPLEMENT, PRO-STAT SUGAR FREE 64,) LIQD Take 30 mLs by mouth 2 (two) times daily between meals.     [name] diltiazem (CARDIZEM CD) 120 MG 24 hr capsule Take 120 mg by mouth every morning.     . divalproex (DEPAKOTE) 250 MG DR tablet Take 250 mg by mouth 2 (two) times daily.    [name] doxazosin (CARDURA) 8 MG tablet Take 8 mg by mouth every morning.     . Fluticasone-Salmeterol (ADVAIR) 250-50 MCG/DOSE AEPB Inhale 1 puff into the lungs 2 (two) times daily.    [name] ipratropium-albuterol (DUONEB) 0.5-2.5 (3) MG/3ML SOLN 3ml via Neb inhalation three times a day for congestion scheduled and 4 times daily as needed for SOB or congestion    . latanoprost (XALATAN) 0.005 % ophthalmic solution Place 1 drop into both eyes at bedtime.     . methenamine (HIPREX) 1 g tablet Take 1 g by mouth 2 (two) times daily with a meal.    . metoprolol tartrate (LOPRESSOR) 25 MG tablet Take 0.5 tablets (12.5 mg total) by mouth 2 (two) times daily. 30 tablet 0  . Salicylic Acid 50 % SOLN Salicylic acid 50 % in petrolatum 4 oz cream apply to left thumb area at bedtime.    . sertraline (ZOLOFT) 25 MG tablet Take three tablets by mouth once daily    . triamcinolone cream (KENALOG) 0.1 % Apply triamcinolone 0.1 % and 1/2 cetaphil cream  to both Rt. and Lt. leg before wrapping on Mon,Wed,Fri.     No current facility-administered medications on file prior to visit.      Review of Systems  Unable to perform ROS: Dementia    Immunization History    Administered Date(s) Administered  . Influenza,inj,Quad PF,36+ Mos 03/20/2013, 03/23/2014, 02/11/2015  . PPD Test 12/26/2014, 01/09/2015  . Pneumococcal Polysaccharide-23 03/02/2007, 02/11/2015  . Td 12/30/1996   Pertinent  Health Maintenance Due  Topic Date Due  . INFLUENZA VACCINE  12/31/2015  . PNA vac Low Risk Adult (2 of 2 - PCV13) 02/11/2016   Fall Risk  01/04/2015 04/18/2014 08/15/2013  Falls in the past year? Yes Yes Yes  Number falls in past yr: 2 or more 1 2 or more  Injury with Fall? No Yes -  Risk Factor Category  - High Fall Risk High Fall Risk  Risk for fall  due to : Impaired balance/gait;Impaired mobility Impaired mobility;Mental status change History of fall(s);Impaired balance/gait;Impaired mobility;Mental status change   Functional Status Survey:    Vitals:   02/11/16 1107  BP: 117/65  Pulse: 77  Resp: 18  Temp: 98.2 F (36.8 C)  TempSrc: Oral  SpO2: 92%   There is no height or weight on file to calculate BMI. Physical Exam  Constitutional: He appears well-developed and well-nourished.  HENT:  Head: Normocephalic.  Mouth/Throat: Oropharynx is clear and moist.  Neck: Neck supple.  Cardiovascular: Normal rate and normal heart sounds.   Pulmonary/Chest: Breath sounds normal. No respiratory distress. He has no wheezes. He has no rales. He exhibits no tenderness.  Abdominal: Bowel sounds are normal. He exhibits no distension. There is no tenderness. There is no rebound and no guarding.  Musculoskeletal: He exhibits edema.  Neurological: He is alert.  Was oriented to place but not time.    Labs reviewed:  Recent Labs  11/29/15 0700 12/16/15 0730 01/15/16 0710  NA 141 141 142  K 4.1 4.3 4.7  CL 109 108 110  CO2 27 26 29   GLUCOSE 90 88 103*  BUN 23* 28* 29*  CREATININE 1.11 1.04 1.30*  CALCIUM 8.3* 8.5* 8.7*    Recent Labs  10/24/15 0705 11/04/15 0530 11/14/15 2312  AST 15 13* 17  ALT 14* 12* 14*  ALKPHOS 76 65 59  BILITOT 0.3 0.4 0.6   PROT 6.6 6.3* 6.5  ALBUMIN 3.1* 2.9* 3.1*    Recent Labs  11/08/15 0715 11/14/15 2312 01/15/16 0710  WBC 7.2 8.7 7.6  NEUTROABS 3.2 5.6 3.1  HGB 11.4* 11.2* 10.6*  HCT 36.6* 34.7* 33.2*  MCV 85.5 84.6 86.9  PLT 254 267 252   Lab Results  Component Value Date   TSH 1.960 09/22/2013   Lab Results  Component Value Date   HGBA1C 6.3 (H) 10/24/2015   Lab Results  Component Value Date   CHOL 157 10/06/2012   HDL 34 (L) 10/06/2012   LDLCALC 96 10/06/2012   TRIG 135 10/06/2012   CHOLHDL 4.6 10/06/2012    Significant Diagnostic Results in last 30 days:  No results found.  Assessment/Plan  Acute Bronchitis  With No wheezing and Pox normal. No fever. But with his history of COPD will start him on  Augmentin 500 mg PO TID for 7 days. POX Q shift Follow up in few days if symptoms are not better  Chronic Atrial Fibrillation  Patient is on coumadin 5 mg Qd. Last INR was 1.79 and dose of coumadin was increased. Repeat PT/INR on Thurs.    Family/ staff Communication:   Labs/tests ordered:

## 2016-02-13 ENCOUNTER — Encounter: Payer: Self-pay | Admitting: Internal Medicine

## 2016-02-13 ENCOUNTER — Non-Acute Institutional Stay (SKILLED_NURSING_FACILITY): Payer: Medicare Other | Admitting: Internal Medicine

## 2016-02-13 ENCOUNTER — Encounter (HOSPITAL_COMMUNITY)
Admission: RE | Admit: 2016-02-13 | Discharge: 2016-02-13 | Disposition: A | Discharge status: Home / Self Care | Payer: Medicare Other | Source: Skilled Nursing Facility | Attending: *Deleted | Admitting: *Deleted

## 2016-02-13 DIAGNOSIS — F0391 Unspecified dementia with behavioral disturbance: Secondary | ICD-10-CM

## 2016-02-13 DIAGNOSIS — J209 Acute bronchitis, unspecified: Secondary | ICD-10-CM

## 2016-02-13 DIAGNOSIS — Z7901 Long term (current) use of anticoagulants: Secondary | ICD-10-CM | POA: Diagnosis not present

## 2016-02-13 DIAGNOSIS — I739 Peripheral vascular disease, unspecified: Secondary | ICD-10-CM | POA: Diagnosis not present

## 2016-02-13 DIAGNOSIS — I482 Chronic atrial fibrillation, unspecified: Secondary | ICD-10-CM

## 2016-02-13 DIAGNOSIS — R0989 Other specified symptoms and signs involving the circulatory and respiratory systems: Secondary | ICD-10-CM | POA: Diagnosis not present

## 2016-02-13 DIAGNOSIS — L97322 Non-pressure chronic ulcer of left ankle with fat layer exposed: Secondary | ICD-10-CM | POA: Diagnosis not present

## 2016-02-13 DIAGNOSIS — F03918 Unspecified dementia, unspecified severity, with other behavioral disturbance: Secondary | ICD-10-CM

## 2016-02-13 DIAGNOSIS — R410 Disorientation, unspecified: Secondary | ICD-10-CM | POA: Diagnosis not present

## 2016-02-13 DIAGNOSIS — I5032 Chronic diastolic (congestive) heart failure: Secondary | ICD-10-CM | POA: Diagnosis not present

## 2016-02-13 LAB — PROTIME-INR
INR: 2.54
PROTHROMBIN TIME: 27.8 s — AB (ref 11.4–15.2)

## 2016-02-13 NOTE — Progress Notes (Addendum)
Location:   [facility] Room Number: 121/W Place of Service:  SNF [phone]) Provider:  Clearnce Sorrel, MD  Patient Care Team: Pecola Lawless, MD as PCP - General (Internal Medicine) [name], MD as Attending Physician (Urology)  Extended Emergency Contact Information Primary Emergency Contact: [name],[name] Address: [address]          [place], [place] [address] [name] Home Phone: [phone] Mobile Phone: [phone] Relation: Daughter Secondary Emergency Contact: Strout,[name] Address: [address]          [place], [place] [address] [name] of [place] Home Phone: [phone] Mobile Phone: [phone] Relation: Son  Code Status:  DNR Goals of care: Advanced Directive information Advanced Directives 02/13/2016  Does patient have an advance directive? Yes  Type of Advance Directive Out of facility DNR (pink MOST or yellow form)  Does patient want to make changes to advanced directive? No - Patient declined  Copy of advanced directive(s) in chart? Yes  Would patient like information on creating an advanced directive? -  Pre-existing out of facility DNR order (yellow form or pink MOST form) -     Chief Complaint  Patient presents with  . Acute Visit     Increased Confussion    HPI:  Pt is a 82 y.o. male seen today for an acute visit for increased confusion. According to nurses and family he has not been himself since last night. He was unable to give me any more history. But it seems he is coughing but has no SOB. Per the nurse he just was behaving strange this morning. Patient was started on Augmentin for Bronchitis 2 days ago.   Past Medical History:  Diagnosis Date  . AAA (abdominal aortic aneurysm) (HCC)   . AAA (abdominal aortic aneurysm) without rupture (HCC) 10/06/2012  . Anticoagulation goal of INR 2 to 3 06/01/2013  . Arthritis    "probably on the right leg/hip" (09/20/2013)  . Atrial fibrillation (HCC)   .  B12 deficiency 09/20/2013  . Benign localized hyperplasia of prostate with urinary retention 12/23/2014  . Bladder calculus 12/23/2014   2.6cm and present since at least 2011 when it was about 1.6cm.   [name] BPH (benign prostatic hyperplasia)   . Bronchospasm   . Cardiomyopathy (HCC) 10/15/2014  . Cellulitis   . CHF (congestive heart failure) (HCC) 09/26/2013  . Chronic venous insufficiency 10/06/2012  . Cognitive impairment   . COPD (chronic obstructive pulmonary disease) (HCC)   . Daily headache   . Dementia    "don't know exactly what kind" (09/20/2013)  . DVT (deep venous thrombosis) (HCC) 05/04/2013   "LLE"  . E. coli UTI 11/17/15   Cipro X 7 days  . Edema-Bilat Leg and foot 03/07/2013  . Elevated PSA   . Fall at home September 18, 2013  . Glaucoma, both eyes   . Kidney stones   . Multifactorial dementia 10/06/2012  . Nicotine dependence   . Obesity, unspecified 10/06/2012  . Peripheral vascular disease (HCC) 09/20/2012  . Pyelonephritis, acute 12/22/2014  . Ulcer   . Venous insufficiency   . Weakness 09/18/2013  . Wound cellulitis    let ankle wound size of half dollar per nurse at [facility] on 03/25/2015   Past Surgical History:  Procedure Laterality Date  . BACK SURGERY    . CATARACT EXTRACTION W/ INTRAOCULAR LENS  IMPLANT, BILATERAL Bilateral   . CHOLECYSTECTOMY  2011   Gall Bladder  . CYSTOSCOPY  W/ STONE MANIPULATION  1970's   "once"  . CYSTOSCOPY WITH LITHOLAPAXY N/A 03/28/2015   Procedure: CYSTOSCOPY WITH LITHOLAPAXY;  Surgeon: Malen GauzePatrick L McKenzie, MD;  Location: [facility];  Service: Urology;  Laterality: N/A;  . EYE SURGERY    . FEMUR FRACTURE SURGERY Right 04/1985  . HIP FRACTURE SURGERY Right 2009  . INSITU PERCUTANEOUS PINNING FEMUR Right 1986  . POSTERIOR LAMINECTOMY / DECOMPRESSION LUMBAR SPINE  2004   Decompressive laminectomy unilaterally on the right at L4-5 with  . SPINE SURGERY    . TRANSURETHRAL RESECTION OF PROSTATE N/A 03/28/2015   Procedure: TRANSURETHRAL  RESECTION OF THE PROSTATE ;  Surgeon: Malen GauzePatrick L McKenzie, MD;  Location: [facility];  Service: Urology;  Laterality: N/A;    Allergies  Allergen Reactions  . Oxycodone Hcl Other (See Comments)     makes pt "wild"  . Propoxyphene N-Acetaminophen Other (See Comments)     Makes pt. "wild"    Current Outpatient Prescriptions on File Prior to Visit  Medication Sig Dispense Refill  . acetaminophen (TYLENOL) 500 MG tablet Take 1,000 mg by mouth every 6 (six) hours as needed for mild pain or fever.     . Amino Acids-Protein Hydrolys (FEEDING SUPPLEMENT, PRO-STAT SUGAR FREE 64,) LIQD Take 30 mLs by mouth 2 (two) times daily between meals.     [name]. diltiazem (CARDIZEM CD) 120 MG 24 hr capsule Take 120 mg by mouth every morning.     . divalproex (DEPAKOTE) 250 MG DR tablet Take 250 mg by mouth 2 (two) times daily.    [name]. doxazosin (CARDURA) 8 MG tablet Take 8 mg by mouth every morning.     . Fluticasone-Salmeterol (ADVAIR) 250-50 MCG/DOSE AEPB Inhale 1 puff into the lungs 2 (two) times daily.    [name]. ipratropium-albuterol (DUONEB) 0.5-2.5 (3) MG/3ML SOLN 3ml via Neb inhalation three times a day for congestion scheduled and 4 times daily as needed for SOB or congestion    . latanoprost (XALATAN) 0.005 % ophthalmic solution Place 1 drop into both eyes at bedtime.     . methenamine (HIPREX) 1 g tablet Take 1 g by mouth 2 (two) times daily with a meal.    . metoprolol tartrate (LOPRESSOR) 25 MG tablet Take 0.5 tablets (12.5 mg total) by mouth 2 (two) times daily. 30 tablet 0  . Salicylic Acid 50 % SOLN Salicylic acid 50 % in petrolatum 4 oz cream apply to left thumb area at bedtime.    . sertraline (ZOLOFT) 25 MG tablet Take three tablets by mouth once daily    . triamcinolone cream (KENALOG) 0.1 % Apply triamcinolone 0.1 % and 1/2 cetaphil cream  to both Rt. and Lt. leg before wrapping on Mon,Wed,Fri.    . warfarin (COUMADIN) 5 MG tablet Take 5 mg by mouth daily.     No current facility-administered medications on  file prior to visit.       Review of Systems  Unable to perform ROS: Dementia    Immunization History  Administered Date(s) Administered  . Influenza,inj,Quad PF,36+ Mos 03/20/2013, 03/23/2014, 02/11/2015  . PPD Test 12/26/2014, 01/09/2015  . Pneumococcal Polysaccharide-23 03/02/2007, 02/11/2015  . Td 12/30/1996   Pertinent  Health Maintenance Due  Topic Date Due  . INFLUENZA VACCINE  12/31/2015  . PNA vac Low Risk Adult (2 of 2 - PCV13) 02/11/2016   Fall Risk  01/04/2015 04/18/2014 08/15/2013  Falls in the past year? Yes Yes Yes  Number falls in past yr: 2 or more 1 2  or more  Injury with Fall? No Yes -  Risk Factor Category  - High Fall Risk High Fall Risk  Risk for fall due to : Impaired balance/gait;Impaired mobility Impaired mobility;Mental status change History of fall(s);Impaired balance/gait;Impaired mobility;Mental status change   Functional Status Survey:   BP 118/67 Temp 99 Pulse 80 POX 94% Physical Exam  Constitutional: He appears well-developed and well-nourished.  HENT:  Head: Normocephalic.  Neck: Neck supple.  Cardiovascular: Normal rate and normal heart sounds.  An irregular rhythm present.  Pulmonary/Chest: Effort normal and breath sounds normal.  Occasional expiratory wheezing no rales  Skin: Skin is warm.  New Pressure wound on the side of Left foot. No redness or erythema around it Positive for edema.Pulses are diminished in both legs.    Labs reviewed:  Recent Labs  11/29/15 0700 12/16/15 0730 01/15/16 0710  NA 141 141 142  K 4.1 4.3 4.7  CL 109 108 110  CO2 27 26 29   GLUCOSE 90 88 103*  BUN 23* 28* 29*  CREATININE 1.11 1.04 1.30*  CALCIUM 8.3* 8.5* 8.7*    Recent Labs  10/24/15 0705 11/04/15 0530 11/14/15 2312  AST 15 13* 17  ALT 14* 12* 14*  ALKPHOS 76 65 59  BILITOT 0.3 0.4 0.6  PROT 6.6 6.3* 6.5  ALBUMIN 3.1* 2.9* 3.1*    Recent Labs  11/08/15 0715 11/14/15 2312 01/15/16 0710  WBC 7.2 8.7 7.6  NEUTROABS 3.2 5.6 3.1    HGB 11.4* 11.2* 10.6*  HCT 36.6* 34.7* 33.2*  MCV 85.5 84.6 86.9  PLT 254 267 252   Lab Results  Component Value Date   TSH 1.960 09/22/2013   Lab Results  Component Value Date   HGBA1C 6.3 (H) 10/24/2015   Lab Results  Component Value Date   CHOL 157 10/06/2012   HDL 34 (L) 10/06/2012   LDLCALC 96 10/06/2012   TRIG 135 10/06/2012   CHOLHDL 4.6 10/06/2012    Significant Diagnostic Results in last 30 days:  No results found.  Assessment/Plan  Increased confusion  He does have low grade fever. His POX has been stable at 94% Will Get Chest xray CBC with Diff CMP  Continue vitals Q 4 hours POX Q 4 hours  Continue on Augmentin Patient does have H/O chronic Foley catheter and is on Chronic UTI. Will not do UA right now. This looks more respiratory..   Chronic Atrial fibrillation  PT /INR today was 2.54 Continue Coumadin 5 mg qd Repeat INR in 1 week.  Pressure wound in Heel of Left foot.  Examined with  Nurse. Getting ABI and new boot to keep pressure of it. Cdonot think it is cause for his low grade temp.     Family/ staff Communication:   Labs/tests ordered:

## 2016-02-14 ENCOUNTER — Encounter (HOSPITAL_COMMUNITY)
Admission: RE | Admit: 2016-02-14 | Discharge: 2016-02-14 | Disposition: A | Discharge status: Home / Self Care | Payer: Medicare Other | Source: Skilled Nursing Facility | Attending: *Deleted | Admitting: *Deleted

## 2016-02-14 DIAGNOSIS — I482 Chronic atrial fibrillation: Secondary | ICD-10-CM | POA: Diagnosis not present

## 2016-02-14 DIAGNOSIS — I5032 Chronic diastolic (congestive) heart failure: Secondary | ICD-10-CM | POA: Diagnosis not present

## 2016-02-14 DIAGNOSIS — Z7901 Long term (current) use of anticoagulants: Secondary | ICD-10-CM | POA: Diagnosis not present

## 2016-02-14 LAB — CBC WITH DIFFERENTIAL/PLATELET
BASOS ABS: 0.1 10*3/uL (ref 0.0–0.1)
BASOS PCT: 1 %
EOS PCT: 6 %
Eosinophils Absolute: 0.6 10*3/uL (ref 0.0–0.7)
HCT: 31.6 % — ABNORMAL LOW (ref 39.0–52.0)
Hemoglobin: 9.9 g/dL — ABNORMAL LOW (ref 13.0–17.0)
Lymphocytes Relative: 15 %
Lymphs Abs: 1.5 10*3/uL (ref 0.7–4.0)
MCH: 27.3 pg (ref 26.0–34.0)
MCHC: 31.3 g/dL (ref 30.0–36.0)
MCV: 87.1 fL (ref 78.0–100.0)
MONO ABS: 1.5 10*3/uL — AB (ref 0.1–1.0)
Monocytes Relative: 14 %
Neutro Abs: 6.7 10*3/uL (ref 1.7–7.7)
Neutrophils Relative %: 64 %
PLATELETS: 325 10*3/uL (ref 150–400)
RBC: 3.63 MIL/uL — ABNORMAL LOW (ref 4.22–5.81)
RDW: 17.6 % — AB (ref 11.5–15.5)
WBC: 10.4 10*3/uL (ref 4.0–10.5)

## 2016-02-14 LAB — COMPREHENSIVE METABOLIC PANEL
ALT: 15 U/L — ABNORMAL LOW (ref 17–63)
ANION GAP: 6 (ref 5–15)
AST: 17 U/L (ref 15–41)
Albumin: 2.7 g/dL — ABNORMAL LOW (ref 3.5–5.0)
Alkaline Phosphatase: 59 U/L (ref 38–126)
BUN: 31 mg/dL — AB (ref 6–20)
CHLORIDE: 111 mmol/L (ref 101–111)
CO2: 26 mmol/L (ref 22–32)
Calcium: 8.7 mg/dL — ABNORMAL LOW (ref 8.9–10.3)
Creatinine, Ser: 1.11 mg/dL (ref 0.61–1.24)
GFR calc Af Amer: >60 mL/min (ref >60)
GFR, EST NON AFRICAN AMERICAN: 60 mL/min — AB (ref >60)
Glucose, Bld: 100 mg/dL — ABNORMAL HIGH (ref 65–99)
POTASSIUM: 3.6 mmol/L (ref 3.5–5.1)
Sodium: 143 mmol/L (ref 135–145)
Total Bilirubin: 0.5 mg/dL (ref 0.3–1.2)
Total Protein: 6.6 g/dL (ref 6.5–8.1)

## 2016-02-17 ENCOUNTER — Encounter: Payer: Self-pay | Admitting: Internal Medicine

## 2016-02-17 ENCOUNTER — Emergency Department (HOSPITAL_COMMUNITY): Payer: Medicare Other

## 2016-02-17 ENCOUNTER — Non-Acute Institutional Stay (SKILLED_NURSING_FACILITY): Payer: Medicare Other | Admitting: Internal Medicine

## 2016-02-17 ENCOUNTER — Encounter (HOSPITAL_COMMUNITY): Payer: Self-pay | Admitting: Emergency Medicine

## 2016-02-17 ENCOUNTER — Emergency Department (HOSPITAL_COMMUNITY)
Admission: EM | Admit: 2016-02-17 | Discharge: 2016-02-18 | Disposition: A | Discharge status: Home / Self Care | Payer: Medicare Other | Attending: Emergency Medicine | Admitting: Emergency Medicine

## 2016-02-17 ENCOUNTER — Emergency Department (HOSPITAL_COMMUNITY): Payer: Self-pay

## 2016-02-17 DIAGNOSIS — F17290 Nicotine dependence, other tobacco product, uncomplicated: Secondary | ICD-10-CM | POA: Diagnosis not present

## 2016-02-17 DIAGNOSIS — Z792 Long term (current) use of antibiotics: Secondary | ICD-10-CM | POA: Insufficient documentation

## 2016-02-17 DIAGNOSIS — R918 Other nonspecific abnormal finding of lung field: Secondary | ICD-10-CM | POA: Diagnosis not present

## 2016-02-17 DIAGNOSIS — Z79899 Other long term (current) drug therapy: Secondary | ICD-10-CM | POA: Diagnosis not present

## 2016-02-17 DIAGNOSIS — T83511A Infection and inflammatory reaction due to indwelling urethral catheter, initial encounter: Secondary | ICD-10-CM | POA: Diagnosis not present

## 2016-02-17 DIAGNOSIS — D649 Anemia, unspecified: Secondary | ICD-10-CM | POA: Insufficient documentation

## 2016-02-17 DIAGNOSIS — Z7901 Long term (current) use of anticoagulants: Secondary | ICD-10-CM | POA: Diagnosis not present

## 2016-02-17 DIAGNOSIS — R531 Weakness: Secondary | ICD-10-CM

## 2016-02-17 DIAGNOSIS — J189 Pneumonia, unspecified organism: Secondary | ICD-10-CM

## 2016-02-17 DIAGNOSIS — J449 Chronic obstructive pulmonary disease, unspecified: Secondary | ICD-10-CM | POA: Insufficient documentation

## 2016-02-17 DIAGNOSIS — J181 Lobar pneumonia, unspecified organism: Principal | ICD-10-CM

## 2016-02-17 DIAGNOSIS — Y828 Other medical devices associated with adverse incidents: Secondary | ICD-10-CM | POA: Insufficient documentation

## 2016-02-17 DIAGNOSIS — N390 Urinary tract infection, site not specified: Secondary | ICD-10-CM

## 2016-02-17 DIAGNOSIS — L89522 Pressure ulcer of left ankle, stage 2: Secondary | ICD-10-CM

## 2016-02-17 DIAGNOSIS — I509 Heart failure, unspecified: Secondary | ICD-10-CM | POA: Insufficient documentation

## 2016-02-17 DIAGNOSIS — R410 Disorientation, unspecified: Secondary | ICD-10-CM | POA: Diagnosis not present

## 2016-02-17 LAB — URINALYSIS, ROUTINE W REFLEX MICROSCOPIC
BILIRUBIN URINE: NEGATIVE
Glucose, UA: NEGATIVE mg/dL
Ketones, ur: NEGATIVE mg/dL
Nitrite: NEGATIVE
Protein, ur: 30 mg/dL — AB
SPECIFIC GRAVITY, URINE: 1.02 (ref 1.005–1.030)
pH: 5.5 (ref 5.0–8.0)

## 2016-02-17 LAB — CBC WITH DIFFERENTIAL/PLATELET
BASOS PCT: 1 %
Basophils Absolute: 0 10*3/uL (ref 0.0–0.1)
EOS ABS: 1.1 10*3/uL — AB (ref 0.0–0.7)
Eosinophils Relative: 13 %
HCT: 34.3 % — ABNORMAL LOW (ref 39.0–52.0)
Hemoglobin: 10.5 g/dL — ABNORMAL LOW (ref 13.0–17.0)
Lymphocytes Relative: 26 %
Lymphs Abs: 2.2 10*3/uL (ref 0.7–4.0)
MCH: 26.6 pg (ref 26.0–34.0)
MCHC: 30.6 g/dL (ref 30.0–36.0)
MCV: 87.1 fL (ref 78.0–100.0)
MONO ABS: 0.9 10*3/uL (ref 0.1–1.0)
MONOS PCT: 11 %
Neutro Abs: 4.1 10*3/uL (ref 1.7–7.7)
Neutrophils Relative %: 49 %
PLATELETS: 353 10*3/uL (ref 150–400)
RBC: 3.94 MIL/uL — ABNORMAL LOW (ref 4.22–5.81)
RDW: 17.2 % — AB (ref 11.5–15.5)
Smear Review: ADEQUATE
WBC: 8.4 10*3/uL (ref 4.0–10.5)

## 2016-02-17 LAB — BASIC METABOLIC PANEL
Anion gap: 8 (ref 5–15)
BUN: 29 mg/dL — AB (ref 6–20)
CHLORIDE: 105 mmol/L (ref 101–111)
CO2: 26 mmol/L (ref 22–32)
CREATININE: 1.04 mg/dL (ref 0.61–1.24)
Calcium: 8.8 mg/dL — ABNORMAL LOW (ref 8.9–10.3)
GFR calc Af Amer: >60 mL/min (ref >60)
Glucose, Bld: 100 mg/dL — ABNORMAL HIGH (ref 65–99)
Potassium: 3.8 mmol/L (ref 3.5–5.1)
SODIUM: 139 mmol/L (ref 135–145)

## 2016-02-17 LAB — URINE MICROSCOPIC-ADD ON

## 2016-02-17 MED ORDER — SODIUM CHLORIDE 0.9 % IV BOLUS (SEPSIS)
1000.0000 mL | Freq: Once | INTRAVENOUS | Status: AC
  Administered 2016-02-17: 1000 mL via INTRAVENOUS

## 2016-02-17 NOTE — ED Provider Notes (Signed)
[facility]-EMERGENCY DEPT Provider Note   CSN: [id number] Arrival date & time: 02/17/16  1828  By signing my name below, I, [name], attest that this documentation has been prepared under the direction and in the presence of Joel Boozeavid Teckla Christiansen, MD. Electronically Signed: Alyssa GroveMartin Mays, ED Scribe. 02/17/16. 11:46 PM.   History   Chief Complaint Chief Complaint  Patient presents with  . Weakness   The history is provided by a relative (Daughter). No language interpreter was used.     LEVEL 5 CAVEAT: HPI and ROS limited due to Dementia  HPI Comments: [name] is a 82 y.o. male with PMHx of AAA, CHF, COPD, and DVT who presents to the Emergency Department complaining of gradual worsening, constant weakness onset 02/14/2016. Pt  Pt was diagnosed on 02/13/2016 with Pneumonia and MRSA in urine at [facility]. Per [facility], he has an open wound on his buttocks and is beginning to have an open wound on his scrotum. He is currently receiving treatment with antibiotics. Reports diarrhea, cough, decreased appetite, fatigue, confusion, fever. Pt has also had an ulcer on his left ankle. He has baseline confusion, but the pt has been experiencing increased confusion for 4 days. Per daughter, pt may have increased weakness on left side. Pt received a catheter in 11/2014, that was removed recently for a few days, but the pt was still unable to urinate. Pt has not experienced vomiting.   Past Medical History:  Diagnosis Date  . AAA (abdominal aortic aneurysm) (HCC)   . AAA (abdominal aortic aneurysm) without rupture (HCC) 10/06/2012  . Anticoagulation goal of INR 2 to 3 06/01/2013  . Arthritis    "probably on the right leg/hip" (09/20/2013)  . Atrial fibrillation (HCC)   . B12 deficiency 09/20/2013  . Benign localized hyperplasia of prostate with urinary retention 12/23/2014  . Bladder calculus 12/23/2014   2.6cm and present since at least 2011 when it was about 1.6cm.   [name]. BPH (benign prostatic hyperplasia)     . Bronchospasm   . Cardiomyopathy (HCC) 10/15/2014  . Cellulitis   . CHF (congestive heart failure) (HCC) 09/26/2013  . Chronic venous insufficiency 10/06/2012  . Cognitive impairment   . COPD (chronic obstructive pulmonary disease) (HCC)   . Daily headache   . Dementia    "don't know exactly what kind" (09/20/2013)  . DVT (deep venous thrombosis) (HCC) 05/04/2013   "LLE"  . E. coli UTI 11/17/15   Cipro X 7 days  . Edema-Bilat Leg and foot 03/07/2013  . Elevated PSA   . Fall at home September 18, 2013  . Glaucoma, both eyes   . Kidney stones   . Multifactorial dementia 10/06/2012  . Nicotine dependence   . Obesity, unspecified 10/06/2012  . Peripheral vascular disease (HCC) 09/20/2012  . Pyelonephritis, acute 12/22/2014  . Ulcer   . Venous insufficiency   . Weakness 09/18/2013  . Wound cellulitis    let ankle wound size of half dollar [facility] on 03/25/2015    Patient Active Problem List   Diagnosis Date Noted  . Pre-diabetes 12/16/2015  . Passage of loose stools 10/29/2015  . Cellulitis of leg, right 05/22/2015  . Hydroureteronephrosis 02/11/2015  . Obstructive uropathy 02/10/2015  . Acute renal failure (HCC) 02/10/2015  . Mixed hearing loss, bilateral 01/11/2015  . Benign localized hyperplasia of prostate with urinary retention 12/23/2014  . Bladder calculus 12/23/2014  . Pyelonephritis, acute 12/22/2014  . Dilated cardiomyopathy (HCC)   .  Other emphysema (HCC)   . Venous stasis ulcer (HCC)   . Urinary tract infection, recurrent 10/14/2014  . CHF (congestive heart failure) (HCC) 09/26/2013  . Paroxysmal atrial fibrillation with rapid ventricular response (HCC) 09/22/2013  . B12 deficiency 09/20/2013  . History of DVT of lower extremity 08/03/2013  . Anticoagulation goal of INR 2 to 3 06/01/2013  . Edema-Bilat Leg and foot 03/07/2013  . Chronic venous insufficiency 10/06/2012  . COPD (chronic obstructive pulmonary disease) (HCC) 10/06/2012  . Obesity,  unspecified 10/06/2012  . AAA (abdominal aortic aneurysm) without rupture (HCC) 10/06/2012  . Cognitive impairment 10/06/2012  . Multifactorial dementia 10/06/2012  . Peripheral vascular disease (HCC) 09/20/2012    Past Surgical History:  Procedure Laterality Date  . BACK SURGERY    . CATARACT EXTRACTION W/ INTRAOCULAR LENS  IMPLANT, BILATERAL Bilateral   . CHOLECYSTECTOMY  2011   Gall Bladder  . CYSTOSCOPY W/ STONE MANIPULATION  1970's   "once"  . CYSTOSCOPY WITH LITHOLAPAXY N/A 03/28/2015   Procedure: CYSTOSCOPY WITH LITHOLAPAXY;  Surgeon: Malen Gauze, MD;  Location: [facility];  Service: Urology;  Laterality: N/A;  . EYE SURGERY    . FEMUR FRACTURE SURGERY Right 04/1985  . HIP FRACTURE SURGERY Right 2009  . INSITU PERCUTANEOUS PINNING FEMUR Right 1986  . POSTERIOR LAMINECTOMY / DECOMPRESSION LUMBAR SPINE  2004   Decompressive laminectomy unilaterally on the right at L4-5 with  . SPINE SURGERY    . TRANSURETHRAL RESECTION OF PROSTATE N/A 03/28/2015   Procedure: TRANSURETHRAL RESECTION OF THE PROSTATE ;  Surgeon: Malen Gauze, MD;  Location: [facility];  Service: Urology;  Laterality: N/A;     Home Medications    Prior to Admission medications   Medication Sig Start Date End Date Taking? Authorizing Provider  acetaminophen (TYLENOL) 500 MG tablet Take 1,000 mg by mouth every 6 (six) hours as needed for mild pain or fever.    Yes Historical Provider, MD  amoxicillin-clavulanate (AUGMENTIN) 500-125 MG tablet Take 1 tablet by mouth 3 (three) times daily. For 7 days END ON 02/18/16   Yes Historical Provider, MD  collagenase (SANTYL) ointment Apply 1 application topically daily.   Yes Historical Provider, MD  diltiazem (CARDIZEM CD) 120 MG 24 hr capsule Take 120 mg by mouth every morning.  10/10/14  Yes Historical Provider, MD  divalproex (DEPAKOTE) 250 MG DR tablet Take 250 mg by mouth 2 (two) times daily.   Yes Historical Provider, MD  doxazosin (CARDURA) 8 MG tablet Take 8 mg  by mouth every morning.    Yes Historical Provider, MD  Fluticasone-Salmeterol (ADVAIR) 250-50 MCG/DOSE AEPB Inhale 1 puff into the lungs 2 (two) times daily.   Yes Historical Provider, MD  guaiFENesin (MUCINEX) 600 MG 12 hr tablet Take 600 mg by mouth 2 (two) times daily.   Yes Historical Provider, MD  ipratropium-albuterol (DUONEB) 0.5-2.5 (3) MG/3ML SOLN 3ml via Neb inhalation three times a day for congestion scheduled and 4 times daily as needed for SOB or congestion   Yes Historical Provider, MD  latanoprost (XALATAN) 0.005 % ophthalmic solution Place 1 drop into both eyes at bedtime.  01/25/12  Yes Historical Provider, MD  methenamine (HIPREX) 1 g tablet Take 1 g by mouth 2 (two) times daily with a meal.   Yes Historical Provider, MD  metoprolol tartrate (LOPRESSOR) 25 MG tablet Take 0.5 tablets (12.5 mg total) by mouth 2 (two) times daily. 12/26/14  Yes Renae Fickle, MD  sertraline (ZOLOFT) 25 MG tablet Take 75  mg by mouth daily. Take three tablets by mouth once daily   Yes Historical Provider, MD  triamcinolone cream (KENALOG) 0.1 % Apply 1 application topically every Monday, Wednesday, and Friday. Apply triamcinolone 0.1 % and 1/2 cetaphil cream  to both Rt. and Lt. leg before wrapping on Mon,Wed,Fri.   Yes Historical Provider, MD  warfarin (COUMADIN) 5 MG tablet Take 5 mg by mouth daily.   Yes Historical Provider, MD    Family History Family History  Problem Relation Age of Onset  . Aneurysm Brother   . Heart attack Brother     10-29-12  . Hypertension Brother   . Heart disease Brother     AAA  . Cancer Father     colon    Social History Social History  Substance Use Topics  . Smoking status: Former Smoker    Packs/day: 2.00    Years: 54.00    Types: Cigarettes    Quit date: 08/24/2002  . Smokeless tobacco: Current User    Types: Snuff     Comment: 09/20/2013 "dips snuff"  . Alcohol use No     Allergies   Oxycodone hcl and Propoxyphene n-acetaminophen   Review of  Systems Review of Systems  Constitutional: Positive for appetite change, fatigue and fever.  Respiratory: Positive for cough.   Gastrointestinal: Positive for diarrhea. Negative for vomiting.  Skin: Positive for rash and wound.  Neurological: Positive for weakness.  Psychiatric/Behavioral: Positive for confusion.  All other systems reviewed and are negative.  Physical Exam Updated Vital Signs BP 122/61   Pulse 63   Temp 98.9 F (37.2 C) (Oral)   Resp 18   Wt (!) 306 lb (138.8 kg)   SpO2 100%   BMI 37.25 kg/m   Physical Exam  Constitutional: He appears well-developed and well-nourished.  HENT:  Head: Normocephalic and atraumatic.  Eyes: EOM are normal. Pupils are equal, round, and reactive to light.  Neck: Normal range of motion. Neck supple. No JVD present.  Cardiovascular: Normal rate and regular rhythm.   Murmur heard. 2/6 systolic ejection murmur in the left sternal border  Pulmonary/Chest: Effort normal and breath sounds normal. He has no wheezes. He has no rales. He exhibits no tenderness.  Abdominal: Soft. Bowel sounds are normal. He exhibits no distension and no mass. There is no tenderness.  Genitourinary:  Genitourinary Comments: Circumcised penis. Foley catheter in place. Testes descended. No skin breakdown.  Musculoskeletal: Normal range of motion. He exhibits edema.  1+ edema  Moderate venous stasis changes Ulcer over the left lateral malleolus stage 2 with some purulent drainage  Lymphadenopathy:    He has no cervical adenopathy.  Neurological: He is alert. No cranial nerve deficit. He exhibits normal muscle tone. Coordination normal.  Oriented to person and place, but not time Generalized weakness without focal weakenss  Skin: Skin is warm and dry. No rash noted.  Psychiatric: He has a normal mood and affect. His behavior is normal. Judgment and thought content normal.  Nursing note and vitals reviewed.  ED Treatments / Results  DIAGNOSTIC  STUDIES: Oxygen Saturation is 100% on RA, normal by my interpretation.    COORDINATION OF CARE: 11:36 PM Discussed treatment plan with pt at bedside which includes CT Head and lab work and pt agreed to plan.   Labs (all labs ordered are listed, but only abnormal results are displayed) Labs Reviewed  CBC WITH DIFFERENTIAL/PLATELET - Abnormal; Notable for the following:       Result Value  RBC 3.94 (*)    Hemoglobin 10.5 (*)    HCT 34.3 (*)    RDW 17.2 (*)    Eosinophils Absolute 1.1 (*)    All other components within normal limits  BASIC METABOLIC PANEL - Abnormal; Notable for the following:    Glucose, Bld 100 (*)    BUN 29 (*)    Calcium 8.8 (*)    All other components within normal limits  URINALYSIS, ROUTINE W REFLEX MICROSCOPIC (NOT AT [facility]) - Abnormal; Notable for the following:    APPearance HAZY (*)    Hgb urine dipstick LARGE (*)    Protein, ur 30 (*)    Leukocytes, UA MODERATE (*)    All other components within normal limits  URINE MICROSCOPIC-ADD ON - Abnormal; Notable for the following:    Squamous Epithelial / LPF 0-5 (*)    Bacteria, UA MANY (*)    All other components within normal limits  HEPATIC FUNCTION PANEL - Abnormal; Notable for the following:    Albumin 2.8 (*)    Indirect Bilirubin 0.2 (*)    All other components within normal limits  PROTIME-INR - Abnormal; Notable for the following:    Prothrombin Time 33.3 (*)    All other components within normal limits  CULTURE, BLOOD (ROUTINE X 2)  CULTURE, BLOOD (ROUTINE X 2)  URINE CULTURE  I-STAT CG4 LACTIC ACID, ED    Radiology Dg Chest 2 View  Result Date: 02/17/2016 CLINICAL DATA:  Fatigue and weakness.  Recent pneumonia EXAM: CHEST  2 VIEW COMPARISON:  September 08, 2015 FINDINGS: There is focal airspace consolidation in the posterior left base region. The lungs elsewhere clear. Heart is borderline enlarged with pulmonary vascularity within normal limits. No adenopathy. There are no bone lesions.  IMPRESSION: Focal infiltrate consistent with pneumonia posterior left base. Lungs otherwise clear. Mild cardiomegaly. Followup PA and lateral chest radiographs recommended in 3-4 weeks following trial of antibiotic therapy to ensure resolution and exclude underlying malignancy. Electronically Signed   By: Bretta Bang III M.D.   On: 02/17/2016 19:12    Procedures Procedures (including critical care time)  Medications Ordered in ED Medications  sodium chloride 0.9 % bolus 1,000 mL (0 mLs Intravenous Stopped 02/18/16 0016)     Initial Impression / Assessment and Plan / ED Course  I have reviewed the triage vital signs and the nursing notes.  Pertinent labs & imaging results that were available during my care of the patient were reviewed by me and considered in my medical decision making (see chart for details).  Clinical Course  Weakness which appears to be multifactorial. Old records are reviewed, and he was recently diagnosed with pneumonia and is currently on amoxicillin-clavulanic acid. He does have an indwelling catheter with known MRSA. He has decubitus ulcer of the left ankle. Laboratory workup shows a stable anemia, and normal electrolytes, normal hepatic function, and stable renal function. Chest x-ray shows pneumonia which had been previously identified, the patient is afebrile with normal WBC. Urinalysis does show evidence of infection. CT of head is stable. Lactic acid level is normal. He was given some IV fluids in the ED and his daughter states he does seem to be better. I do not see anything that requires hospitalization at this point. He is discharged to return back to the skilled nursing facility to complete his course of antibiotics. Return should symptoms worsen.   I personally performed the services described in this documentation, which was scribed in my presence. The  recorded information has been reviewed and is accurate.   Final Clinical Impressions(s) / ED Diagnoses    Final diagnoses:  Weakness  HCAP (healthcare-associated pneumonia)  Urinary tract infection associated with indwelling urethral catheter, initial encounter  Decubitus ulcer, ankle, left, stage II  Anticoagulated on warfarin  Normochromic normocytic anemia    New Prescriptions New Prescriptions   No medications on file     Joel Booze, MD 02/18/16 [phone]

## 2016-02-17 NOTE — Progress Notes (Signed)
Location:   [facility] Room Number: 121/W Place of Service:  SNF [phone]) Provider:  Clearnce Sorrel, MD  Patient Care Team: Pecola Lawless, MD as PCP - General (Internal Medicine) [name], MD as Attending Physician (Urology)  Extended Emergency Contact Information Primary Emergency Contact: [name],[name] Address: [address]          [place], [place] [address] [name] of [place] Home Phone: [phone] Mobile Phone: [phone] Relation: Daughter Secondary Emergency Contact: Belmonte,[name] Address: [address]          [place], [place] [address] [name] of [place] Home Phone: [phone] Mobile Phone: [phone] Relation: Son  Code Status:  DNR Goals of care: Advanced Directive information Advanced Directives 02/17/2016  Does patient have an advance directive? Yes  Type of Advance Directive Out of facility DNR (pink MOST or yellow form)  Does patient want to make changes to advanced directive? No - Patient declined  Copy of advanced directive(s) in chart? Yes  Would patient like information on creating an advanced directive? -  Pre-existing out of facility DNR order (yellow form or pink MOST form) -     Chief Complaint  Patient presents with  . Acute Visit    For Left lower Lobe Pneumonia    HPI:  Pt is a 82 y.o. male seen today for an acute visit for continuous drowsiness. According to the nurses patient continues to be increasing confused and drowsy. Talked to patients daughter and she said that he is not himself. His appetite is reduced also. Patient himself is not complaining of anything. He was sleeping in the bed. Just said that he is  in the hospital. Denied any Chest pain , SOB, or cough. His chest X-ray on Friday showed Left lower lobe Infiltrate. But since he ws on Augmentin he was continued to be monitored clinically.   Past Medical History:  Diagnosis Date  . AAA (abdominal aortic aneurysm) (HCC)   . AAA  (abdominal aortic aneurysm) without rupture (HCC) 10/06/2012  . Anticoagulation goal of INR 2 to 3 06/01/2013  . Arthritis    "probably on the right leg/hip" (09/20/2013)  . Atrial fibrillation (HCC)   . B12 deficiency 09/20/2013  . Benign localized hyperplasia of prostate with urinary retention 12/23/2014  . Bladder calculus 12/23/2014   2.6cm and present since at least 2011 when it was about 1.6cm.   [name] BPH (benign prostatic hyperplasia)   . Bronchospasm   . Cardiomyopathy (HCC) 10/15/2014  . Cellulitis   . CHF (congestive heart failure) (HCC) 09/26/2013  . Chronic venous insufficiency 10/06/2012  . Cognitive impairment   . COPD (chronic obstructive pulmonary disease) (HCC)   . Daily headache   . Dementia    "don't know exactly what kind" (09/20/2013)  . DVT (deep venous thrombosis) (HCC) 05/04/2013   "LLE"  . E. coli UTI 11/17/15   Cipro X 7 days  . Edema-Bilat Leg and foot 03/07/2013  . Elevated PSA   . Fall at home September 18, 2013  . Glaucoma, both eyes   . Kidney stones   . Multifactorial dementia 10/06/2012  . Nicotine dependence   . Obesity, unspecified 10/06/2012  . Peripheral vascular disease (HCC) 09/20/2012  . Pyelonephritis, acute 12/22/2014  . Ulcer   . Venous insufficiency   . Weakness 09/18/2013  . Wound cellulitis    let ankle wound size of half dollar per nurse at [facility] on 03/25/2015   Past Surgical History:  Procedure  Laterality Date  . BACK SURGERY    . CATARACT EXTRACTION W/ INTRAOCULAR LENS  IMPLANT, BILATERAL Bilateral   . CHOLECYSTECTOMY  2011   Gall Bladder  . CYSTOSCOPY W/ STONE MANIPULATION  1970's   "once"  . CYSTOSCOPY WITH LITHOLAPAXY N/A 03/28/2015   Procedure: CYSTOSCOPY WITH LITHOLAPAXY;  Surgeon: Malen GauzePatrick L McKenzie, MD;  Location: [facility];  Service: Urology;  Laterality: N/A;  . EYE SURGERY    . FEMUR FRACTURE SURGERY Right 04/1985  . HIP FRACTURE SURGERY Right 2009  . INSITU PERCUTANEOUS PINNING FEMUR Right 1986  . POSTERIOR LAMINECTOMY /  DECOMPRESSION LUMBAR SPINE  2004   Decompressive laminectomy unilaterally on the right at L4-5 with  . SPINE SURGERY    . TRANSURETHRAL RESECTION OF PROSTATE N/A 03/28/2015   Procedure: TRANSURETHRAL RESECTION OF THE PROSTATE ;  Surgeon: Malen GauzePatrick L McKenzie, MD;  Location: [facility];  Service: Urology;  Laterality: N/A;    Allergies  Allergen Reactions  . Oxycodone Hcl Other (See Comments)     makes pt "wild"  . Propoxyphene N-Acetaminophen Other (See Comments)     Makes pt. "wild"    Current Outpatient Prescriptions on File Prior to Visit  Medication Sig Dispense Refill  . acetaminophen (TYLENOL) 500 MG tablet Take 1,000 mg by mouth every 6 (six) hours as needed for mild pain or fever.     . Amino Acids-Protein Hydrolys (FEEDING SUPPLEMENT, PRO-STAT SUGAR FREE 64,) LIQD Take 30 mLs by mouth 2 (two) times daily between meals.     [name]. amoxicillin-clavulanate (AUGMENTIN) 500-125 MG tablet Take 1 tablet by mouth 3 (three) times daily. For 7 days END ON 02/18/16    . diltiazem (CARDIZEM CD) 120 MG 24 hr capsule Take 120 mg by mouth every morning.     . divalproex (DEPAKOTE) 250 MG DR tablet Take 250 mg by mouth 2 (two) times daily.    [name]. doxazosin (CARDURA) 8 MG tablet Take 8 mg by mouth every morning.     . Fluticasone-Salmeterol (ADVAIR) 250-50 MCG/DOSE AEPB Inhale 1 puff into the lungs 2 (two) times daily.    [name]. ipratropium-albuterol (DUONEB) 0.5-2.5 (3) MG/3ML SOLN 3ml via Neb inhalation three times a day for congestion scheduled and 4 times daily as needed for SOB or congestion    . latanoprost (XALATAN) 0.005 % ophthalmic solution Place 1 drop into both eyes at bedtime.     . methenamine (HIPREX) 1 g tablet Take 1 g by mouth 2 (two) times daily with a meal.    . metoprolol tartrate (LOPRESSOR) 25 MG tablet Take 0.5 tablets (12.5 mg total) by mouth 2 (two) times daily. 30 tablet 0  . sertraline (ZOLOFT) 25 MG tablet Take three tablets by mouth once daily    . triamcinolone cream (KENALOG) 0.1 %  Apply triamcinolone 0.1 % and 1/2 cetaphil cream  to both Rt. and Lt. leg before wrapping on Mon,Wed,Fri.    . warfarin (COUMADIN) 5 MG tablet Take 5 mg by mouth daily.     No current facility-administered medications on file prior to visit.     Review of Systems  Reason unable to perform ROS: Unable to obtain due to dementia.    Immunization History  Administered Date(s) Administered  . Influenza,inj,Quad PF,36+ Mos 03/20/2013, 03/23/2014, 02/11/2015  . PPD Test 12/26/2014, 01/09/2015  . Pneumococcal Polysaccharide-23 03/02/2007, 02/11/2015  . Td 12/30/1996   Pertinent  Health Maintenance Due  Topic Date Due  . INFLUENZA VACCINE  12/31/2015  . PNA vac Low Risk  Adult (2 of 2 - PCV13) 02/11/2016   Fall Risk  01/04/2015 04/18/2014 08/15/2013  Falls in the past year? Yes Yes Yes  Number falls in past yr: 2 or more 1 2 or more  Injury with Fall? No Yes -  Risk Factor Category  - High Fall Risk High Fall Risk  Risk for fall due to : Impaired balance/gait;Impaired mobility Impaired mobility;Mental status change History of fall(s);Impaired balance/gait;Impaired mobility;Mental status change   Functional Status Survey:    Vitals:   02/17/16 1414  BP: (!) 169/54  Pulse: 99  Resp: 20  Temp: 98.8 F (37.1 C)  TempSrc: Oral   There is no height or weight on file to calculate BMI. Physical Exam  Constitutional: He appears well-developed and well-nourished. He is sleeping. He is easily aroused.  HENT:  Mouth/Throat: Oropharynx is clear and moist.  Cardiovascular: Normal rate and normal heart sounds.   Pulmonary/Chest: Effort normal and breath sounds normal. No respiratory distress. He has no wheezes.  Some rales B/L Lower lobes  Abdominal: Soft. Bowel sounds are normal. He exhibits no distension. There is no tenderness. There is no rebound.  Musculoskeletal: He exhibits edema.  Neurological: He is easily aroused.    Labs reviewed:  Recent Labs  12/16/15 0730 01/15/16 0710  02/14/16 0710  NA 141 142 143  K 4.3 4.7 3.6  CL 108 110 111  CO2 26 29 26   GLUCOSE 88 103* 100*  BUN 28* 29* 31*  CREATININE 1.04 1.30* 1.11  CALCIUM 8.5* 8.7* 8.7*    Recent Labs  11/04/15 0530 11/14/15 2312 02/14/16 0710  AST 13* 17 17  ALT 12* 14* 15*  ALKPHOS 65 59 59  BILITOT 0.4 0.6 0.5  PROT 6.3* 6.5 6.6  ALBUMIN 2.9* 3.1* 2.7*    Recent Labs  11/14/15 2312 01/15/16 0710 02/14/16 0710  WBC 8.7 7.6 10.4  NEUTROABS 5.6 3.1 6.7  HGB 11.2* 10.6* 9.9*  HCT 34.7* 33.2* 31.6*  MCV 84.6 86.9 87.1  PLT 267 252 325   Lab Results  Component Value Date   TSH 1.960 09/22/2013   Lab Results  Component Value Date   HGBA1C 6.3 (H) 10/24/2015   Lab Results  Component Value Date   CHOL 157 10/06/2012   HDL 34 (L) 10/06/2012   LDLCALC 96 10/06/2012   TRIG 135 10/06/2012   CHOLHDL 4.6 10/06/2012    Significant Diagnostic Results in last 30 days:  No results found.  Assessment/Plan  Left Lower Lobe Pneumonia  It can be the cause of his being lethargic and drowsy. Though he has been afebrile now and his POX is stable he does need to IV antibiotics. He is probably failing oral Antibiotics. He has been on Augmentin now for almost 6 days. D/W the daughter. She does want him to be sent to ED for further evaluation. Will send patient to ED .   Family/ staff Communication:   Labs/tests ordered:

## 2016-02-17 NOTE — ED Triage Notes (Signed)
Pt sent over by the [facility] for further evaluation of his decrease in appetite and increase in fatigue/generalized weakness. Pt recently diagnosed with pneumonia and is currently being treated with antibiotics. Pt also has MRSA in his urine per staff at [facility].

## 2016-02-18 ENCOUNTER — Emergency Department (HOSPITAL_COMMUNITY): Payer: Medicare Other

## 2016-02-18 DIAGNOSIS — J189 Pneumonia, unspecified organism: Secondary | ICD-10-CM | POA: Diagnosis not present

## 2016-02-18 LAB — HEPATIC FUNCTION PANEL
ALK PHOS: 54 U/L (ref 38–126)
ALT: 21 U/L (ref 17–63)
AST: 25 U/L (ref 15–41)
Albumin: 2.8 g/dL — ABNORMAL LOW (ref 3.5–5.0)
BILIRUBIN DIRECT: 0.1 mg/dL (ref 0.1–0.5)
BILIRUBIN INDIRECT: 0.2 mg/dL — AB (ref 0.3–0.9)
BILIRUBIN TOTAL: 0.3 mg/dL (ref 0.3–1.2)
Total Protein: 7 g/dL (ref 6.5–8.1)

## 2016-02-18 LAB — I-STAT CG4 LACTIC ACID, ED: Lactic Acid, Venous: 0.62 mmol/L (ref 0.5–1.9)

## 2016-02-18 LAB — PROTIME-INR
INR: 3.18
Prothrombin Time: 33.3 s — ABNORMAL HIGH (ref 11.4–15.2)

## 2016-02-18 NOTE — ED Notes (Signed)
DR Preston FleetingGlick removed wraps to bilateral LE- venous stasis ulcer noted to LLE lateral malleolus, partial thickness, approx 25 % eschar noted at 12 o'clock, the remaining wound bed is pale yellowish pink with minimal serosanguineous exudate. Both LE are dry with scattered keratinized patches of skin. Water soluble lubricant applied to ulcer on LLE, covered with abd pad, wrapped with kerlix and secured with Coban. Pt tolerated well. Both LE floated on pillows as to prevent pressure to heels and ankles.

## 2016-02-18 NOTE — ED Notes (Signed)
LLE elevated on pillow to float lateral ankle and heel.

## 2016-02-18 NOTE — Discharge Instructions (Signed)
Continue current antibiotics and treatments.

## 2016-02-19 ENCOUNTER — Encounter: Payer: Self-pay | Admitting: Internal Medicine

## 2016-02-19 ENCOUNTER — Non-Acute Institutional Stay (SKILLED_NURSING_FACILITY): Payer: Medicare Other | Admitting: Internal Medicine

## 2016-02-19 ENCOUNTER — Other Ambulatory Visit: Payer: Self-pay | Admitting: Internal Medicine

## 2016-02-19 DIAGNOSIS — F068 Other specified mental disorders due to known physiological condition: Secondary | ICD-10-CM

## 2016-02-19 DIAGNOSIS — I480 Paroxysmal atrial fibrillation: Secondary | ICD-10-CM

## 2016-02-19 DIAGNOSIS — I83029 Varicose veins of left lower extremity with ulcer of unspecified site: Secondary | ICD-10-CM

## 2016-02-19 DIAGNOSIS — F0390 Unspecified dementia without behavioral disturbance: Secondary | ICD-10-CM

## 2016-02-19 DIAGNOSIS — J189 Pneumonia, unspecified organism: Secondary | ICD-10-CM | POA: Diagnosis not present

## 2016-02-19 DIAGNOSIS — I739 Peripheral vascular disease, unspecified: Secondary | ICD-10-CM

## 2016-02-19 DIAGNOSIS — J181 Lobar pneumonia, unspecified organism: Principal | ICD-10-CM

## 2016-02-19 DIAGNOSIS — IMO0001 Reserved for inherently not codable concepts without codable children: Secondary | ICD-10-CM

## 2016-02-19 NOTE — Progress Notes (Signed)
Location:   [facility] Room Number: 121/W Place of Service:  SNF [phone]) Provider:  Para Skeans, MD  Patient Care Team: Pecola Lawless, MD as PCP - General (Internal Medicine) [name], MD as Attending Physician (Urology) [name], MD as Consulting Physician (Geriatrics)   Extended Emergency Contact Information Primary Emergency Contact: [name],[name] Address: [address]          [place], [place] [address] [name] of [place] Home Phone: [phone] Mobile Phone: [phone] Relation: Daughter Secondary Emergency Contact: K[place]uk,[name] Address: [address]          [place], [place] [address] [name] of [place] Home Phone: [phone] Mobile Phone: [phone] Relation: Son  Code Status:  DNR Goals of care: Advanced Directive information Advanced Directives 02/17/2016  Does patient have an advance directive? Yes  Type of Advance Directive Out of facility DNR (pink MOST or yellow form)  Does patient want to make changes to advanced directive? No - Patient declined  Copy of advanced directive(s) in chart? Yes  Would patient like information on creating an advanced directive? -  Pre-existing out of facility DNR order (yellow form or pink MOST form) -     Chief Complaint  Patient presents with  . Acute Visit    cough    HPI:  Pt is a 82 y.o. male seen today for an acute visit for follow up ion patients Pneumonia. Patient is doing much better.His cough is much better. He is more alert and according to the nurses back to his baseline. Not C/O chest pain or SOB. His appetite has improved .   Past Medical History:  Diagnosis Date  . AAA (abdominal aortic aneurysm) (HCC)   . AAA (abdominal aortic aneurysm) without rupture (HCC) 10/06/2012  . Anticoagulation goal of INR 2 to 3 06/01/2013  . Arthritis    "probably on the right leg/hip" (09/20/2013)  . Atrial fibrillation (HCC)   . B12 deficiency 09/20/2013  . Benign  localized hyperplasia of prostate with urinary retention 12/23/2014  . Bladder calculus 12/23/2014   2.6cm and present since at least 2011 when it was about 1.6cm.   [name] BPH (benign prostatic hyperplasia)   . Bronchospasm   . Cardiomyopathy (HCC) 10/15/2014  . Cellulitis   . CHF (congestive heart failure) (HCC) 09/26/2013  . Chronic venous insufficiency 10/06/2012  . Cognitive impairment   . COPD (chronic obstructive pulmonary disease) (HCC)   . Daily headache   . Dementia    "don't know exactly what kind" (09/20/2013)  . DVT (deep venous thrombosis) (HCC) 05/04/2013   "LLE"  . E. coli UTI 11/17/15   Cipro X 7 days  . Edema-Bilat Leg and foot 03/07/2013  . Elevated PSA   . Fall at home September 18, 2013  . Glaucoma, both eyes   . Kidney stones   . Multifactorial dementia 10/06/2012  . Nicotine dependence   . Obesity, unspecified 10/06/2012  . Peripheral vascular disease (HCC) 09/20/2012  . Pyelonephritis, acute 12/22/2014  . Ulcer   . Venous insufficiency   . Weakness 09/18/2013  . Wound cellulitis    let ankle wound size of half dollar per nurse at [facility] on 03/25/2015   Past Surgical History:  Procedure Laterality Date  . BACK SURGERY    . CATARACT EXTRACTION W/ INTRAOCULAR LENS  IMPLANT, BILATERAL Bilateral   . CHOLECYSTECTOMY  2011   Gall Bladder  . CYSTOSCOPY W/ STONE MANIPULATION  1970's   "once"  .  CYSTOSCOPY WITH LITHOLAPAXY N/A 03/28/2015   Procedure: CYSTOSCOPY WITH LITHOLAPAXY;  Surgeon: Malen Gauze, MD;  Location: [facility];  Service: Urology;  Laterality: N/A;  . EYE SURGERY    . FEMUR FRACTURE SURGERY Right 04/1985  . HIP FRACTURE SURGERY Right 2009  . INSITU PERCUTANEOUS PINNING FEMUR Right 1986  . POSTERIOR LAMINECTOMY / DECOMPRESSION LUMBAR SPINE  2004   Decompressive laminectomy unilaterally on the right at L4-5 with  . SPINE SURGERY    . TRANSURETHRAL RESECTION OF PROSTATE N/A 03/28/2015   Procedure: TRANSURETHRAL RESECTION OF THE PROSTATE ;  Surgeon:  Malen Gauze, MD;  Location: [facility];  Service: Urology;  Laterality: N/A;    Allergies  Allergen Reactions  . Oxycodone Hcl Other (See Comments)     makes pt "wild"  . Propoxyphene N-Acetaminophen Other (See Comments)     Makes pt. "wild"      Medication List       Accurate as of 02/19/16 11:39 AM. Always use your most recent med list.          acetaminophen 500 MG tablet Commonly known as:  TYLENOL Take 1,000 mg by mouth every 6 (six) hours as needed for mild pain or fever.   amoxicillin-clavulanate 500-125 MG tablet Commonly known as:  AUGMENTIN Take 1 tablet by mouth 3 (three) times daily. For 7 days END ON 02/21/16   diltiazem 120 MG 24 hr capsule Commonly known as:  CARDIZEM CD Take 120 mg by mouth every morning.   divalproex 250 MG DR tablet Commonly known as:  DEPAKOTE Take 250 mg by mouth 2 (two) times daily.   doxazosin 8 MG tablet Commonly known as:  CARDURA Take 8 mg by mouth every morning.   Fluticasone-Salmeterol 250-50 MCG/DOSE Aepb Commonly known as:  ADVAIR Inhale 1 puff into the lungs 2 (two) times daily.   guaiFENesin 600 MG 12 hr tablet Commonly known as:  MUCINEX Take 600 mg by mouth 2 (two) times daily.   ipratropium-albuterol 0.5-2.5 (3) MG/3ML Soln Commonly known as:  DUONEB 3ml via Neb inhalation three times a day for congestion scheduled and 4 times daily as needed for SOB or congestion   latanoprost 0.005 % ophthalmic solution Commonly known as:  XALATAN Place 1 drop into both eyes at bedtime.   methenamine 1 g tablet Commonly known as:  HIPREX Take 1 g by mouth 2 (two) times daily with a meal.   metoprolol tartrate 25 MG tablet Commonly known as:  LOPRESSOR Take 0.5 tablets (12.5 mg total) by mouth 2 (two) times daily.   SANTYL ointment Generic drug:  collagenase Apply 1 application topically daily.   sertraline 25 MG tablet Commonly known as:  ZOLOFT Take 75 mg by mouth daily. Take three tablets by mouth once  daily   triamcinolone cream 0.1 % Commonly known as:  KENALOG Apply 1 application topically every Monday, Wednesday, and Friday. Apply triamcinolone 0.1 % and 1/2 cetaphil cream  to both Rt. and Lt. leg before wrapping on Mon,Wed,Fri.   warfarin 4 MG tablet Commonly known as:  COUMADIN Give 4 mg along with 0.5 mg to equal 4.5 mg once an evening   warfarin 1 MG tablet Commonly known as:  COUMADIN Give 0.5 mg along with 4 mg to equal 4.5 mg once a evening       Review of Systems  Reason unable to perform ROS: Not very reliable due to dementia and hard of hearing.  Constitutional: Negative.   Respiratory: Negative for  apnea, cough, chest tightness and shortness of breath.   Cardiovascular: Negative.   Gastrointestinal: Negative.     Immunization History  Administered Date(s) Administered  . Influenza,inj,Quad PF,36+ Mos 03/20/2013, 03/23/2014, 02/11/2015  . PPD Test 12/26/2014, 01/09/2015  . Pneumococcal Polysaccharide-23 03/02/2007, 02/11/2015  . Td 12/30/1996   Pertinent  Health Maintenance Due  Topic Date Due  . INFLUENZA VACCINE  12/31/2015  . PNA vac Low Risk Adult (2 of 2 - PCV13) 02/11/2016   Fall Risk  01/04/2015 04/18/2014 08/15/2013  Falls in the past year? Yes Yes Yes  Number falls in past yr: 2 or more 1 2 or more  Injury with Fall? No Yes -  Risk Factor Category  - High Fall Risk High Fall Risk  Risk for fall due to : Impaired balance/gait;Impaired mobility Impaired mobility;Mental status change History of fall(s);Impaired balance/gait;Impaired mobility;Mental status change   Functional Status Survey:    Vitals:   02/19/16 1108  BP: (!) 136/59  Pulse: (!) 58  Resp: 19  Temp: 97.9 F (36.6 C)  TempSrc: Oral   There is no height or weight on file to calculate BMI. Physical Exam  Constitutional: He appears well-developed and well-nourished.  HENT:  Head: Normocephalic.  Eyes: Pupils are equal, round, and reactive to light.  Cardiovascular: Normal rate  and regular rhythm.   Pulmonary/Chest: Effort normal and breath sounds normal. No respiratory distress. He has no wheezes. He has no rales.  Abdominal: Soft. Bowel sounds are normal. He exhibits no distension. There is no tenderness. There is no rebound and no guarding.  Musculoskeletal: He exhibits edema.  Neurological: He is alert.    Labs reviewed:  Recent Labs  01/15/16 0710 02/14/16 0710 02/17/16 1909  NA 142 143 139  K 4.7 3.6 3.8  CL 110 111 105  CO2 29 26 26   GLUCOSE 103* 100* 100*  BUN 29* 31* 29*  CREATININE 1.30* 1.11 1.04  CALCIUM 8.7* 8.7* 8.8*    Recent Labs  11/14/15 2312 02/14/16 0710 02/17/16 2351  AST 17 17 25   ALT 14* 15* 21  ALKPHOS 59 59 54  BILITOT 0.6 0.5 0.3  PROT 6.5 6.6 7.0  ALBUMIN 3.1* 2.7* 2.8*    Recent Labs  01/15/16 0710 02/14/16 0710 02/17/16 1909  WBC 7.6 10.4 8.4  NEUTROABS 3.1 6.7 4.1  HGB 10.6* 9.9* 10.5*  HCT 33.2* 31.6* 34.3*  MCV 86.9 87.1 87.1  PLT 252 325 353   Lab Results  Component Value Date   TSH 1.960 09/22/2013   Lab Results  Component Value Date   HGBA1C 6.3 (H) 10/24/2015   Lab Results  Component Value Date   CHOL 157 10/06/2012   HDL 34 (L) 10/06/2012   LDLCALC 96 10/06/2012   TRIG 135 10/06/2012   CHOLHDL 4.6 10/06/2012    Significant Diagnostic Results in last 30 days:  Dg Chest 2 View  Result Date: 02/17/2016 CLINICAL DATA:  Fatigue and weakness.  Recent pneumonia EXAM: CHEST  2 VIEW COMPARISON:  September 08, 2015 FINDINGS: There is focal airspace consolidation in the posterior left base region. The lungs elsewhere clear. Heart is borderline enlarged with pulmonary vascularity within normal limits. No adenopathy. There are no bone lesions. IMPRESSION: Focal infiltrate consistent with pneumonia posterior left base. Lungs otherwise clear. Mild cardiomegaly. Followup PA and lateral chest radiographs recommended in 3-4 weeks following trial of antibiotic therapy to ensure resolution and exclude  underlying malignancy. Electronically Signed   By: Bretta BangWilliam  Woodruff III M.D.  On: 02/17/2016 19:12   Ct Head Wo Contrast  Result Date: 02/18/2016 CLINICAL DATA:  Acute onset of worsening generalized weakness and altered mental status. Initial encounter. EXAM: CT HEAD WITHOUT CONTRAST TECHNIQUE: Contiguous axial images were obtained from the base of the skull through the vertex without intravenous contrast. COMPARISON:  CT of the head performed 12/22/2014, and MRI of the brain performed 09/18/2013 FINDINGS: Brain: No evidence of acute infarction, hemorrhage, hydrocephalus, extra-axial collection or mass lesion/mass effect. Prominence of the ventricles and sulci reflects moderate cortical volume loss. Chronic lacunar infarcts are noted at the left cerebellar hemisphere. Scattered periventricular and subcortical white matter change likely reflects small vessel ischemic microangiopathy. The brainstem and fourth ventricle are within normal limits. The basal ganglia are unremarkable in appearance. The cerebral hemispheres demonstrate grossly normal gray-white differentiation. No mass effect or midline shift is seen. Vascular: No hyperdense vessel or unexpected calcification. Skull: There is no evidence of fracture; visualized osseous structures are unremarkable in appearance. Sinuses/Orbits: The orbits are within normal limits. The paranasal sinuses and mastoid air cells are well-aerated. Other: No significant soft tissue abnormalities are seen. IMPRESSION: 1. No acute intracranial pathology seen on CT. 2. Moderate cortical volume loss and scattered small vessel ischemic microangiopathy. 3. Chronic lacunar infarcts at the left cerebellar hemisphere. Electronically Signed   By: Roanna Raider M.D.   On: 02/18/2016 00:53    Assessment/Plan  Left Lower Lobe pneumonia  Patient clinically doing well. Continue Augmentin for 14 days. Repeat Chest Xray in one month to see completely resolve as recommended by  Radiology.   Chronic Atrial Fibrillation   His INR was 3.18 Hold coumadin for 3 days.  Restart 4.5 mg PO Qd. Repeat INR in one week.  Chronic venous stasis ulcer in Left leg.  It is in wrap right now Patient does not tolerate Ted hoses.  His wound is doing better also.  Dementia with Behavior problems  Doing well on Depakote. Is more alert today.      Family/ staff Communication:   Labs/tests ordered:

## 2016-02-20 DIAGNOSIS — I739 Peripheral vascular disease, unspecified: Secondary | ICD-10-CM | POA: Diagnosis not present

## 2016-02-20 DIAGNOSIS — I5032 Chronic diastolic (congestive) heart failure: Secondary | ICD-10-CM | POA: Diagnosis not present

## 2016-02-20 DIAGNOSIS — L97322 Non-pressure chronic ulcer of left ankle with fat layer exposed: Secondary | ICD-10-CM | POA: Diagnosis not present

## 2016-02-21 LAB — URINE CULTURE: Culture: 60000 — AB

## 2016-02-22 ENCOUNTER — Telehealth (HOSPITAL_BASED_OUTPATIENT_CLINIC_OR_DEPARTMENT_OTHER): Payer: Self-pay

## 2016-02-22 NOTE — Telephone Encounter (Signed)
Post ED Visit - Positive Culture Follow-up  Culture report reviewed by antimicrobial stewardship pharmacist:  []  [name], Pharm.D. []  [name], Pharm.D., BCPS []  [name], Pharm.D. []  [name], Pharm.D., BCPS []  [name], [address] BCPS, AAHIVP []  [name], Pharm.D., BCPS, AAHIVP []  [name], Pharm.D. []  [name], [address]Ph[address] Rachel Rumsbarger Pharm D Positive urine culture and no further patient follow-up is required at this time.  [name], [name] 02/22/2016, 10:07 AM

## 2016-02-23 LAB — CULTURE, BLOOD (ROUTINE X 2)
CULTURE: NO GROWTH
Culture: NO GROWTH

## 2016-02-25 ENCOUNTER — Ambulatory Visit (HOSPITAL_COMMUNITY)
Admission: RE | Admit: 2016-02-25 | Discharge: 2016-02-25 | Disposition: A | Discharge status: Home / Self Care | Payer: Medicare Other | Source: Ambulatory Visit | Attending: Internal Medicine | Admitting: Internal Medicine

## 2016-02-25 ENCOUNTER — Encounter (HOSPITAL_COMMUNITY)
Admission: RE | Admit: 2016-02-25 | Discharge: 2016-02-25 | Disposition: A | Discharge status: Home / Self Care | Payer: Medicare Other | Source: Skilled Nursing Facility | Attending: *Deleted | Admitting: *Deleted

## 2016-02-25 DIAGNOSIS — R600 Localized edema: Secondary | ICD-10-CM | POA: Diagnosis not present

## 2016-02-25 DIAGNOSIS — I739 Peripheral vascular disease, unspecified: Secondary | ICD-10-CM | POA: Insufficient documentation

## 2016-02-25 DIAGNOSIS — I5032 Chronic diastolic (congestive) heart failure: Secondary | ICD-10-CM | POA: Diagnosis not present

## 2016-02-25 DIAGNOSIS — Z7901 Long term (current) use of anticoagulants: Secondary | ICD-10-CM | POA: Diagnosis not present

## 2016-02-25 DIAGNOSIS — I482 Chronic atrial fibrillation: Secondary | ICD-10-CM | POA: Diagnosis not present

## 2016-02-25 LAB — PROTIME-INR
INR: 2.24
Prothrombin Time: 25.2 seconds — ABNORMAL HIGH (ref 11.4–15.2)

## 2016-02-26 ENCOUNTER — Ambulatory Visit: Payer: Self-pay | Admitting: Urology

## 2016-02-27 DIAGNOSIS — I739 Peripheral vascular disease, unspecified: Secondary | ICD-10-CM | POA: Diagnosis not present

## 2016-02-27 DIAGNOSIS — L97322 Non-pressure chronic ulcer of left ankle with fat layer exposed: Secondary | ICD-10-CM | POA: Diagnosis not present

## 2016-02-27 DIAGNOSIS — I5032 Chronic diastolic (congestive) heart failure: Secondary | ICD-10-CM | POA: Diagnosis not present

## 2016-03-05 DIAGNOSIS — L97322 Non-pressure chronic ulcer of left ankle with fat layer exposed: Secondary | ICD-10-CM | POA: Diagnosis not present

## 2016-03-05 DIAGNOSIS — I739 Peripheral vascular disease, unspecified: Secondary | ICD-10-CM | POA: Diagnosis not present

## 2016-03-05 DIAGNOSIS — I5032 Chronic diastolic (congestive) heart failure: Secondary | ICD-10-CM | POA: Diagnosis not present

## 2016-03-06 DIAGNOSIS — Z9181 History of falling: Secondary | ICD-10-CM | POA: Diagnosis not present

## 2016-03-06 DIAGNOSIS — J449 Chronic obstructive pulmonary disease, unspecified: Secondary | ICD-10-CM | POA: Diagnosis not present

## 2016-03-06 DIAGNOSIS — R293 Abnormal posture: Secondary | ICD-10-CM | POA: Diagnosis not present

## 2016-03-06 DIAGNOSIS — I5032 Chronic diastolic (congestive) heart failure: Secondary | ICD-10-CM | POA: Diagnosis not present

## 2016-03-09 DIAGNOSIS — J449 Chronic obstructive pulmonary disease, unspecified: Secondary | ICD-10-CM | POA: Diagnosis not present

## 2016-03-09 DIAGNOSIS — R293 Abnormal posture: Secondary | ICD-10-CM | POA: Diagnosis not present

## 2016-03-09 DIAGNOSIS — Z9181 History of falling: Secondary | ICD-10-CM | POA: Diagnosis not present

## 2016-03-09 DIAGNOSIS — I5032 Chronic diastolic (congestive) heart failure: Secondary | ICD-10-CM | POA: Diagnosis not present

## 2016-03-10 ENCOUNTER — Encounter (HOSPITAL_COMMUNITY)
Admission: RE | Admit: 2016-03-10 | Discharge: 2016-03-10 | Disposition: A | Discharge status: Home / Self Care | Payer: Medicare Other | Source: Skilled Nursing Facility | Attending: Internal Medicine | Admitting: Internal Medicine

## 2016-03-10 ENCOUNTER — Encounter: Payer: Self-pay | Admitting: Internal Medicine

## 2016-03-10 ENCOUNTER — Non-Acute Institutional Stay (SKILLED_NURSING_FACILITY): Payer: Medicare Other | Admitting: Internal Medicine

## 2016-03-10 DIAGNOSIS — Z961 Presence of intraocular lens: Secondary | ICD-10-CM | POA: Diagnosis not present

## 2016-03-10 DIAGNOSIS — Z7901 Long term (current) use of anticoagulants: Secondary | ICD-10-CM | POA: Insufficient documentation

## 2016-03-10 DIAGNOSIS — Z7951 Long term (current) use of inhaled steroids: Secondary | ICD-10-CM | POA: Diagnosis not present

## 2016-03-10 DIAGNOSIS — I480 Paroxysmal atrial fibrillation: Secondary | ICD-10-CM

## 2016-03-10 DIAGNOSIS — F39 Unspecified mood [affective] disorder: Secondary | ICD-10-CM | POA: Insufficient documentation

## 2016-03-10 DIAGNOSIS — R21 Rash and other nonspecific skin eruption: Secondary | ICD-10-CM

## 2016-03-10 DIAGNOSIS — I739 Peripheral vascular disease, unspecified: Secondary | ICD-10-CM

## 2016-03-10 DIAGNOSIS — Z9181 History of falling: Secondary | ICD-10-CM | POA: Diagnosis not present

## 2016-03-10 DIAGNOSIS — F0391 Unspecified dementia with behavioral disturbance: Secondary | ICD-10-CM | POA: Insufficient documentation

## 2016-03-10 DIAGNOSIS — I482 Chronic atrial fibrillation: Secondary | ICD-10-CM | POA: Diagnosis present

## 2016-03-10 DIAGNOSIS — I5032 Chronic diastolic (congestive) heart failure: Secondary | ICD-10-CM | POA: Insufficient documentation

## 2016-03-10 DIAGNOSIS — I872 Venous insufficiency (chronic) (peripheral): Secondary | ICD-10-CM

## 2016-03-10 DIAGNOSIS — H401134 Primary open-angle glaucoma, bilateral, indeterminate stage: Secondary | ICD-10-CM | POA: Diagnosis not present

## 2016-03-10 LAB — PROTIME-INR
INR: 3.11
Prothrombin Time: 32.7 s — ABNORMAL HIGH (ref 11.4–15.2)

## 2016-03-10 NOTE — Progress Notes (Signed)
Location:   [facility] Room Number: 121/W Place of Service:  SNF (31) Provider:  Ferrin Liebig,Ephram Kornegay  No primary care provider on file.  Patient Care Team: [name], MD as Attending Physician (Urology) [name], MD as Consulting Physician (Genetics) [name], MD as Consulting Physician (Geriatric Medicine)  Extended Emergency Contact Information Primary Emergency Contact: [name],[name] Address: [address]          [place], [place] [address] [name] of [place] Home Phone: [phone] Mobile Phone: [phone] Relation: Daughter Secondary Emergency Contact: Noffke,[name] Address: [address]          [place], [place] [address] [name] of [place] Home Phone: [phone] Mobile Phone: [phone] Relation: Son  Code Status:  DNR Goals of care: Advanced Directive information Advanced Directives 03/10/2016  Does patient have an advance directive? Yes  Type of Advance Directive Out of facility DNR (pink MOST or yellow form)  Does patient want to make changes to advanced directive? No - Patient declined  Copy of advanced directive(s) in chart? Yes  Would patient like information on creating an advanced directive? -  Pre-existing out of facility DNR order (yellow form or pink MOST form) -     Chief Complaint  Patient presents with  . Acute Visit    Left arm swollen,left lower leg swollen and red, left hip bruised    HPI:  Pt is a 82 y.o. male seen today for an acute visit for after fall. Patient has h/o Chronic Atrial fibrillation, Chronic Foley due to BPH, Chronic Venous stasis ulcer and dementia with Behavior problems.  Patient was in courtyard on the wheel chair on Thursday and fell. The Nurses noticed today noticed that patient had swelling in his left arm , Left knee and Buttocks. He also is developing rash in his groin area. Patient continues to be on Coumadin for his Chronic Atrial fibrillation. Patient is unable to give any history  due to his dementia.  He does deny having any pain in his knee or arm. Also it does not seem like there was any LOC with fall.   Past Medical History:  Diagnosis Date  . AAA (abdominal aortic aneurysm) (HCC)   . AAA (abdominal aortic aneurysm) without rupture (HCC) 10/06/2012  . Anticoagulation goal of INR 2 to 3 06/01/2013  . Arthritis    "probably on the right leg/hip" (09/20/2013)  . Atrial fibrillation (HCC)   . B12 deficiency 09/20/2013  . Benign localized hyperplasia of prostate with urinary retention 12/23/2014  . Bladder calculus 12/23/2014   2.6cm and present since at least 2011 when it was about 1.6cm.   [name] BPH (benign prostatic hyperplasia)   . Bronchospasm   . Cardiomyopathy (HCC) 10/15/2014  . Cellulitis   . CHF (congestive heart failure) (HCC) 09/26/2013  . Chronic venous insufficiency 10/06/2012  . Cognitive impairment   . COPD (chronic obstructive pulmonary disease) (HCC)   . Daily headache   . Dementia    "don't know exactly what kind" (09/20/2013)  . DVT (deep venous thrombosis) (HCC) 05/04/2013   "LLE"  . E. coli UTI 11/17/15   Cipro X 7 days  . Edema-Bilat Leg and foot 03/07/2013  . Elevated PSA   . Fall at home September 18, 2013  . Glaucoma, both eyes   . Kidney stones   . Multifactorial dementia 10/06/2012  . Nicotine dependence   . Obesity, unspecified 10/06/2012  . Peripheral vascular disease (HCC) 09/20/2012  . Pyelonephritis, acute 12/22/2014  .  Ulcer (HCC)   . Venous insufficiency   . Weakness 09/18/2013  . Wound cellulitis    let ankle wound size of half dollar per nurse at [facility] on 03/25/2015   Past Surgical History:  Procedure Laterality Date  . BACK SURGERY    . CATARACT EXTRACTION W/ INTRAOCULAR LENS  IMPLANT, BILATERAL Bilateral   . CHOLECYSTECTOMY  2011   Gall Bladder  . CYSTOSCOPY W/ STONE MANIPULATION  1970's   "once"  . CYSTOSCOPY WITH LITHOLAPAXY N/A 03/28/2015   Procedure: CYSTOSCOPY WITH LITHOLAPAXY;  Surgeon: Malen Gauze, MD;   Location: [facility];  Service: Urology;  Laterality: N/A;  . EYE SURGERY    . FEMUR FRACTURE SURGERY Right 04/1985  . HIP FRACTURE SURGERY Right 2009  . INSITU PERCUTANEOUS PINNING FEMUR Right 1986  . POSTERIOR LAMINECTOMY / DECOMPRESSION LUMBAR SPINE  2004   Decompressive laminectomy unilaterally on the right at L4-5 with  . SPINE SURGERY    . TRANSURETHRAL RESECTION OF PROSTATE N/A 03/28/2015   Procedure: TRANSURETHRAL RESECTION OF THE PROSTATE ;  Surgeon: Malen Gauze, MD;  Location: [facility];  Service: Urology;  Laterality: N/A;    Allergies  Allergen Reactions  . Oxycodone Hcl Other (See Comments)     makes pt "wild"  . Propoxyphene N-Acetaminophen Other (See Comments)     Makes pt. "wild"      Medication List       Accurate as of 03/10/16 10:37 AM. Always use your most recent med list.          acetaminophen 500 MG tablet Commonly known as:  TYLENOL Take 1,000 mg by mouth every 6 (six) hours as needed for mild pain or fever.   diltiazem 120 MG 24 hr capsule Commonly known as:  CARDIZEM CD Take 120 mg by mouth every morning.   divalproex 250 MG DR tablet Commonly known as:  DEPAKOTE Give 250 mg by mouth once a day. Give 500 mg by mouth at bedtime   doxazosin 8 MG tablet Commonly known as:  CARDURA Take 8 mg by mouth every morning.   feeding supplement (PRO-STAT SUGAR FREE 64) Liqd Take 30 mLs by mouth 2 (two) times daily between meals.   Fluticasone-Salmeterol 250-50 MCG/DOSE Aepb Commonly known as:  ADVAIR Inhale 1 puff into the lungs 2 (two) times daily.   ipratropium-albuterol 0.5-2.5 (3) MG/3ML Soln Commonly known as:  DUONEB 3ml via Neb inhalation three times a day for congestion scheduled and 4 times daily as needed for SOB or congestion   latanoprost 0.005 % ophthalmic solution Commonly known as:  XALATAN Place 1 drop into both eyes at bedtime.   methenamine 1 g tablet Commonly known as:  HIPREX Take 1 g by mouth 2 (two) times daily with a  meal.   metoprolol tartrate 25 MG tablet Commonly known as:  LOPRESSOR Take 0.5 tablets (12.5 mg total) by mouth 2 (two) times daily.   SANTYL ointment Generic drug:  collagenase Apply 1 application topically daily.   sertraline 25 MG tablet Commonly known as:  ZOLOFT Take 75 mg by mouth daily. Take three tablets by mouth once daily   triamcinolone cream 0.1 % Commonly known as:  KENALOG Apply 1 application topically every Monday, Wednesday, and Friday. Apply triamcinolone 0.1 % and 1/2 cetaphil cream  to both Rt. and Lt. leg before wrapping on Mon,Wed,Fri.   warfarin 4 MG tablet Commonly known as:  COUMADIN Give 4 mg along with 0.5 mg to equal 4.5 mg  once an evening   warfarin 1 MG tablet Commonly known as:  COUMADIN Give 0.5 mg along with 4 mg to equal 4.5 mg once a evening       Review of Systems  Unable to perform ROS: Dementia    Immunization History  Administered Date(s) Administered  . Influenza,inj,Quad PF,36+ Mos 03/20/2013, 03/23/2014, 02/11/2015  . Influenza-Unspecified 03/05/2016  . PPD Test 12/26/2014, 01/09/2015  . Pneumococcal Polysaccharide-23 03/02/2007, 02/11/2015  . Td 12/30/1996   Pertinent  Health Maintenance Due  Topic Date Due  . PNA vac Low Risk Adult (2 of 2 - PCV13) 02/11/2016  . INFLUENZA VACCINE  05/01/2016 (Originally 12/31/2015)   Fall Risk  01/04/2015 04/18/2014 08/15/2013  Falls in the past year? Yes Yes Yes  Number falls in past yr: 2 or more 1 2 or more  Injury with Fall? No Yes -  Risk Factor Category  - High Fall Risk High Fall Risk  Risk for fall due to : Impaired balance/gait;Impaired mobility Impaired mobility;Mental status change History of fall(s);Impaired balance/gait;Impaired mobility;Mental status change   Functional Status Survey:    Vitals:   03/10/16 1017  BP: (!) 147/52  Pulse: (!) 57  Resp: 20  Temp: 98.4 F (36.9 C)  TempSrc: Oral  SpO2: 94%   There is no height or weight on file to calculate BMI. Physical  Exam  Constitutional: He appears well-developed and well-nourished.  HENT:  Head: Normocephalic.  Cardiovascular: Normal rate and normal heart sounds.   Pulmonary/Chest: Breath sounds normal. No respiratory distress. He has no wheezes.  Abdominal: Soft. Bowel sounds are normal. He exhibits no distension. There is no tenderness. There is no rebound.  Neurological: He is alert.  Skin:  Small Bruise on the Left Buttock. Has redness in and around his scrotum and folds of groin with Some breaking down of skin. Also removed his dressing from the left lower leg. It was wrapped in dry dressing. Patient had redness around the edges of the wrap. Also has non healing pressure wound on the side with clear margins.    Labs reviewed:  Recent Labs  01/15/16 0710 02/14/16 0710 02/17/16 1909  NA 142 143 139  K 4.7 3.6 3.8  CL 110 111 105  CO2 29 26 26   GLUCOSE 103* 100* 100*  BUN 29* 31* 29*  CREATININE 1.30* 1.11 1.04  CALCIUM 8.7* 8.7* 8.8*    Recent Labs  11/14/15 2312 02/14/16 0710 02/17/16 2351  AST 17 17 25   ALT 14* 15* 21  ALKPHOS 59 59 54  BILITOT 0.6 0.5 0.3  PROT 6.5 6.6 7.0  ALBUMIN 3.1* 2.7* 2.8*    Recent Labs  01/15/16 0710 02/14/16 0710 02/17/16 1909  WBC 7.6 10.4 8.4  NEUTROABS 3.1 6.7 4.1  HGB 10.6* 9.9* 10.5*  HCT 33.2* 31.6* 34.3*  MCV 86.9 87.1 87.1  PLT 252 325 353   Lab Results  Component Value Date   TSH 1.960 09/22/2013   Lab Results  Component Value Date   HGBA1C 6.3 (H) 10/24/2015   Lab Results  Component Value Date   CHOL 157 10/06/2012   HDL 34 (L) 10/06/2012   LDLCALC 96 10/06/2012   TRIG 135 10/06/2012   CHOLHDL 4.6 10/06/2012    Significant Diagnostic Results in last 30 days:  Dg Chest 2 View  Result Date: 02/17/2016 CLINICAL DATA:  Fatigue and weakness.  Recent pneumonia EXAM: CHEST  2 VIEW COMPARISON:  September 08, 2015 FINDINGS: There is focal airspace consolidation in the posterior  left base region. The lungs elsewhere clear.  Heart is borderline enlarged with pulmonary vascularity within normal limits. No adenopathy. There are no bone lesions. IMPRESSION: Focal infiltrate consistent with pneumonia posterior left base. Lungs otherwise clear. Mild cardiomegaly. Followup PA and lateral chest radiographs recommended in 3-4 weeks following trial of antibiotic therapy to ensure resolution and exclude underlying malignancy. Electronically Signed   By: Bretta Bang III M.D.   On: 02/17/2016 19:12   Ct Head Wo Contrast  Result Date: 02/18/2016 CLINICAL DATA:  Acute onset of worsening generalized weakness and altered mental status. Initial encounter. EXAM: CT HEAD WITHOUT CONTRAST TECHNIQUE: Contiguous axial images were obtained from the base of the skull through the vertex without intravenous contrast. COMPARISON:  CT of the head performed 12/22/2014, and MRI of the brain performed 09/18/2013 FINDINGS: Brain: No evidence of acute infarction, hemorrhage, hydrocephalus, extra-axial collection or mass lesion/mass effect. Prominence of the ventricles and sulci reflects moderate cortical volume loss. Chronic lacunar infarcts are noted at the left cerebellar hemisphere. Scattered periventricular and subcortical white matter change likely reflects small vessel ischemic microangiopathy. The brainstem and fourth ventricle are within normal limits. The basal ganglia are unremarkable in appearance. The cerebral hemispheres demonstrate grossly normal gray-white differentiation. No mass effect or midline shift is seen. Vascular: No hyperdense vessel or unexpected calcification. Skull: There is no evidence of fracture; visualized osseous structures are unremarkable in appearance. Sinuses/Orbits: The orbits are within normal limits. The paranasal sinuses and mastoid air cells are well-aerated. Other: No significant soft tissue abnormalities are seen. IMPRESSION: 1. No acute intracranial pathology seen on CT. 2. Moderate cortical volume loss and  scattered small vessel ischemic microangiopathy. 3. Chronic lacunar infarcts at the left cerebellar hemisphere. Electronically Signed   By: Roanna Raider M.D.   On: 02/18/2016 00:53   US Arterial Seg Multiple  Result Date: 02/25/2016 CLINICAL DATA:  Chronic bilateral lower extremity edema from venous stasis. Chronic left lower extremity venous ulcer. EXAM: NONINVASIVE PHYSIOLOGIC VASCULAR STUDY OF BILATERAL LOWER EXTREMITIES TECHNIQUE: Evaluation of both lower extremities was performed at rest, including calculation of ankle-brachial indices, multiple segmental pressure evaluation, segmental Doppler and segmental pulse volume recording. COMPARISON:  None. FINDINGS: Right ABI:  1.07 Left ABI:  1.15 Right Lower Extremity: Biphasic and triphasic Doppler waveforms in the right lower extremity. The PVR waveforms are within normal limits. Left Lower Extremity: Some of the Doppler waveforms are irregular. Triphasic Doppler waveforms in the left dorsalis pedis artery. PVR waveforms in the left calf and ankle are similar to the right ankle. Decreased PVR waveform amplitudes in the left thigh compared to the right side. IMPRESSION: Normal ankle-brachial indices. No significant peripheral arterial disease in lower extremities. Electronically Signed   By: Richarda Overlie M.D.   On: 02/25/2016 15:08    Assessment/Plan  Status Post fall with injury to Left arm and leg. Patient on coumadin. Patient does not seem to have sustained any injuries with his fall. Will continue to clinically follow him.  Fungal rash in groin area Will start him on Diflucan 200 mg PO QD for 5 days. Redness in the Left Leg does not seem to be due to wrap. Does not need any Antibiotics. Will follow with the nurses. ABI were normal. Lotrisone cream for affected area  Chronic coumadin therapy  Last INR was 2.3 INR pending for today.    Family/ staff Communication:   Labs/tests ordered:

## 2016-03-11 ENCOUNTER — Encounter: Payer: Self-pay | Admitting: Internal Medicine

## 2016-03-11 ENCOUNTER — Encounter (HOSPITAL_COMMUNITY)
Admission: RE | Admit: 2016-03-11 | Discharge: 2016-03-11 | Disposition: A | Discharge status: Home / Self Care | Payer: Medicare Other | Source: Skilled Nursing Facility | Attending: *Deleted | Admitting: *Deleted

## 2016-03-11 ENCOUNTER — Non-Acute Institutional Stay (SKILLED_NURSING_FACILITY): Payer: Medicare Other | Admitting: Internal Medicine

## 2016-03-11 DIAGNOSIS — M79662 Pain in left lower leg: Secondary | ICD-10-CM | POA: Diagnosis not present

## 2016-03-11 DIAGNOSIS — M25552 Pain in left hip: Secondary | ICD-10-CM | POA: Diagnosis not present

## 2016-03-11 DIAGNOSIS — W19XXXA Unspecified fall, initial encounter: Secondary | ICD-10-CM | POA: Diagnosis not present

## 2016-03-11 DIAGNOSIS — Z7901 Long term (current) use of anticoagulants: Secondary | ICD-10-CM

## 2016-03-11 DIAGNOSIS — M79632 Pain in left forearm: Secondary | ICD-10-CM | POA: Diagnosis not present

## 2016-03-11 DIAGNOSIS — M79622 Pain in left upper arm: Secondary | ICD-10-CM | POA: Diagnosis not present

## 2016-03-11 DIAGNOSIS — Z5181 Encounter for therapeutic drug level monitoring: Secondary | ICD-10-CM

## 2016-03-11 DIAGNOSIS — I10 Essential (primary) hypertension: Secondary | ICD-10-CM

## 2016-03-11 DIAGNOSIS — I5032 Chronic diastolic (congestive) heart failure: Secondary | ICD-10-CM | POA: Diagnosis not present

## 2016-03-11 LAB — PROTIME-INR
INR: 3.14
Prothrombin Time: 33 seconds — ABNORMAL HIGH (ref 11.4–15.2)

## 2016-03-11 NOTE — Progress Notes (Signed)
Location:   [facility] Room Number: 121/W Place of Service:  SNF (31) Provider:  Edmon Crape  No primary care provider on file.  Patient Care Team: [name], MD as Attending Physician (Urology) [name], MD as Consulting Physician (Geriatric Medicine)  Extended Emergency Contact Information Primary Emergency Contact: [name],[name] Address: [address]          [place], [place] [address] [name] of [place] Home Phone: [phone] Mobile Phone: [phone] Relation: Daughter Secondary Emergency Contact: Kurek,[name] Address: [address]          [place], [place] [address] [name] of [place] Home Phone: [phone] Mobile Phone: [phone] Relation: Son  Code Status:  DNR Goals of care: Advanced Directive information Advanced Directives 03/11/2016  Does patient have an advance directive? Yes  Type of Advance Directive Out of facility DNR (pink MOST or yellow form)  Does patient want to make changes to advanced directive? No - Patient declined  Copy of advanced directive(s) in chart? Yes  Would patient like information on creating an advanced directive? -  Pre-existing out of facility DNR order (yellow form or pink MOST form) -     Chief Complaint  Patient presents with  . Acute Visit    Fall  Also follow-up anticoagulation  HPI:  Pt is a 82 y.o. male seen today for an acute visit for another fall. Per patient apparently fell yesterday with no apparent injury-he was found this afternoon sitting in front of his wheelchair-appears she slid out of his wheelchair which apparently he is prone to do.  He was evaluated yesterday and did not really have any apparent significant injury-.  He has complained of some left arm and left hip discomfort apparently over the last day or 2.  Denies hitting his head and appeared does not appear there was any loss of consciousness he appeared to be comfortable sitting on the floor.  Staff did help  him to bed blood pressure showed a systolic in the 90s however I took this manually and got 120/58 which is more his baseline.  Currently he is lying in bed comfortably he was seen yesterday for a groin rash and has been started on Diflucan.  So has a small bruise on his left buttocks which appears to be stable from yesterday.  He is on Coumadin with a history of atrial fibrillation-as well as DVT in the past.  INR today is elevated at 3.14-it was 3.11 yesterday and was also held yesterday previous INR on September 26 was 2.24 he does have a history of elevated INRs at times.  He does have some chronic bruising most noticeable with his upper arms bilaterally.     Past Medical History:  Diagnosis Date  . AAA (abdominal aortic aneurysm) (HCC)   . AAA (abdominal aortic aneurysm) without rupture (HCC) 10/06/2012  . Anticoagulation goal of INR 2 to 3 06/01/2013  . Arthritis    "probably on the right leg/hip" (09/20/2013)  . Atrial fibrillation (HCC)   . B12 deficiency 09/20/2013  . Benign localized hyperplasia of prostate with urinary retention 12/23/2014  . Bladder calculus 12/23/2014   2.6cm and present since at least 2011 when it was about 1.6cm.   [name] BPH (benign prostatic hyperplasia)   . Bronchospasm   . Cardiomyopathy (HCC) 10/15/2014  . Cellulitis   . CHF (congestive heart failure) (HCC) 09/26/2013  . Chronic venous insufficiency 10/06/2012  . Cognitive impairment   . COPD (chronic obstructive pulmonary disease) (HCC)   .  Daily headache   . Dementia    "don't know exactly what kind" (09/20/2013)  . DVT (deep venous thrombosis) (HCC) 05/04/2013   "LLE"  . E. coli UTI 11/17/15   Cipro X 7 days  . Edema-Bilat Leg and foot 03/07/2013  . Elevated PSA   . Fall at home September 18, 2013  . Glaucoma, both eyes   . Kidney stones   . Multifactorial dementia 10/06/2012  . Nicotine dependence   . Obesity, unspecified 10/06/2012  . Peripheral vascular disease (HCC) 09/20/2012  . Pyelonephritis, acute  12/22/2014  . Ulcer (HCC)   . Venous insufficiency   . Weakness 09/18/2013  . Wound cellulitis    let ankle wound size of half dollar per nurse [facility] on 03/25/2015   Past Surgical History:  Procedure Laterality Date  . BACK SURGERY    . CATARACT EXTRACTION W/ INTRAOCULAR LENS  IMPLANT, BILATERAL Bilateral   . CHOLECYSTECTOMY  2011   Gall Bladder  . CYSTOSCOPY W/ STONE MANIPULATION  1970's   "once"  . CYSTOSCOPY WITH LITHOLAPAXY N/A 03/28/2015   Procedure: CYSTOSCOPY WITH LITHOLAPAXY;  Surgeon: Malen Gauze, MD;  Location: [facility];  Service: Urology;  Laterality: N/A;  . EYE SURGERY    . FEMUR FRACTURE SURGERY Right 04/1985  . HIP FRACTURE SURGERY Right 2009  . INSITU PERCUTANEOUS PINNING FEMUR Right 1986  . POSTERIOR LAMINECTOMY / DECOMPRESSION LUMBAR SPINE  2004   Decompressive laminectomy unilaterally on the right at L4-5 with  . SPINE SURGERY    . TRANSURETHRAL RESECTION OF PROSTATE N/A 03/28/2015   Procedure: TRANSURETHRAL RESECTION OF THE PROSTATE ;  Surgeon: Malen Gauze, MD;  Location: [facility];  Service: Urology;  Laterality: N/A;    Allergies  Allergen Reactions  . Oxycodone Hcl Other (See Comments)     makes pt "wild"  . Propoxyphene N-Acetaminophen Other (See Comments)     Makes pt. "wild"      Medication List       Accurate as of 03/11/16  4:14 PM. Always use your most recent med list.          acetaminophen 500 MG tablet Commonly known as:  TYLENOL Take 1,000 mg by mouth every 6 (six) hours as needed for mild pain or fever.   diltiazem 120 MG 24 hr capsule Commonly known as:  CARDIZEM CD Take 120 mg by mouth every morning.   divalproex 250 MG DR tablet Commonly known as:  DEPAKOTE Give 250 mg by mouth once a day. Give 500 mg by mouth at bedtime   doxazosin 8 MG tablet Commonly known as:  CARDURA Take 8 mg by mouth every morning.   feeding supplement (PRO-STAT SUGAR FREE 64) Liqd Take 30 mLs by mouth 2 (two) times daily  between meals.   fluconazole 200 MG tablet Commonly known as:  DIFLUCAN Take 200 mg by mouth daily. Give X 5 days stop on 03/14/16   Fluticasone-Salmeterol 250-50 MCG/DOSE Aepb Commonly known as:  ADVAIR Inhale 1 puff into the lungs 2 (two) times daily.   ipratropium-albuterol 0.5-2.5 (3) MG/3ML Soln Commonly known as:  DUONEB 3ml via Neb inhalation three times a day for congestion scheduled and 4 times daily as needed for SOB or congestion   latanoprost 0.005 % ophthalmic solution Commonly known as:  XALATAN Place 1 drop into both eyes at bedtime.   methenamine 1 g tablet Commonly known as:  HIPREX Take 1 g by mouth 2 (two) times daily with a  meal.   metoprolol tartrate 25 MG tablet Commonly known as:  LOPRESSOR Take 0.5 tablets (12.5 mg total) by mouth 2 (two) times daily.   SANTYL ointment Generic drug:  collagenase Apply 1 application topically daily.   sertraline 25 MG tablet Commonly known as:  ZOLOFT Take 75 mg by mouth daily. Take three tablets by mouth once daily   triamcinolone cream 0.1 % Commonly known as:  KENALOG Apply 1 application topically every Monday, Wednesday, and Friday. Apply triamcinolone 0.1 % and 1/2 cetaphil cream  to both Rt. and Lt. leg before wrapping on Mon,Wed,Fri.   warfarin 4 MG tablet Commonly known as:  COUMADIN Give 4 mg along with 0.5 mg to equal 4.5 mg once an evening   warfarin 1 MG tablet Commonly known as:  COUMADIN Give 0.5 mg along with 4 mg to equal 4.5 mg once a evening       Review of Systems   Limited secondary to dementia currently he is not complaining of any discomfort although at times has complained apparently of some left hip and left arm discomfort on the past couple days.  He denies hitting his head does not complaining of any dizziness or headache or syncope before the fall.  Denies chest pain shortness of breath as well  Immunization History  Administered Date(s) Administered  . Influenza,inj,Quad  PF,36+ Mos 03/20/2013, 03/23/2014, 02/11/2015  . Influenza-Unspecified 03/05/2016  . PPD Test 12/26/2014, 01/09/2015  . Pneumococcal Polysaccharide-23 03/02/2007, 02/11/2015  . Td 12/30/1996   Pertinent  Health Maintenance Due  Topic Date Due  . PNA vac Low Risk Adult (2 of 2 - PCV13) 02/11/2016  . INFLUENZA VACCINE  Completed   Fall Risk  01/04/2015 04/18/2014 08/15/2013  Falls in the past year? Yes Yes Yes  Number falls in past yr: 2 or more 1 2 or more  Injury with Fall? No Yes -  Risk Factor Category  - High Fall Risk High Fall Risk  Risk for fall due to : Impaired balance/gait;Impaired mobility Impaired mobility;Mental status change History of fall(s);Impaired balance/gait;Impaired mobility;Mental status change   Functional Status Survey:    Vitals:   03/11/16 1602  BP: (!) 120/58  Pulse: 68  Resp: 18  Temp: 98 F (36.7 C)  TempSrc: Oral  SpO2: 90%   There is no height or weight on file to calculate BMI. Physical Exam   In general this is a somewhat obese elderly male in no distress initially evaluated while sitting on the floor in front of his wheelchair and then in bed.  His skin is warm and dry he does have some bruising most apparent arms bilaterally later when he was in bed I did assess groin area and he does have an erythematous confluence rash in the groin area as well as.  He has a small violaceous-appearing bruise left buttocks  Does have a pressure wound noted on lateral left foot ankle area with clear margins  He has venous stasis changes his left lower leg right lower leg has currently wrapped.  Eyes sclera and conjunctiva clear visual acuity appears grossly intact.  Oropharynx is clear mucous membranes moist.  Chest is clear to auscultation there is no labored breathing has somewhat reduced air entry.  Heart is regular rate and rhythm with an occasional irregular beat he has again venous stasis changes lower extremities bilaterally.  Abdomen is obese  soft nontender with positive bowel sounds   GU he does have a Foley catheter draining amber colored urine.  Musculoskeletal is able to move all extremities 4 I did flex and extend at the hip he does not really appear to have acute pain here but has complained of some left hip discomfort previously apparently.  He does have good grip strength does have limited range of motion of his arms and shoulders bilaterally-he has complained of some left arm pain as well --but is not really complaining of acute pain today.  Neurologic is grossly intact to speech is clear no lateralizing findings.  Psyche is oriented to self is pleasant and appropriate although somewhat agitated with exam   Labs reviewed:  Recent Labs  01/15/16 0710 02/14/16 0710 02/17/16 1909  NA 142 143 139  K 4.7 3.6 3.8  CL 110 111 105  CO2 29 26 26   GLUCOSE 103* 100* 100*  BUN 29* 31* 29*  CREATININE 1.30* 1.11 1.04  CALCIUM 8.7* 8.7* 8.8*    Recent Labs  11/14/15 2312 02/14/16 0710 02/17/16 2351  AST 17 17 25   ALT 14* 15* 21  ALKPHOS 59 59 54  BILITOT 0.6 0.5 0.3  PROT 6.5 6.6 7.0  ALBUMIN 3.1* 2.7* 2.8*    Recent Labs  01/15/16 0710 02/14/16 0710 02/17/16 1909  WBC 7.6 10.4 8.4  NEUTROABS 3.1 6.7 4.1  HGB 10.6* 9.9* 10.5*  HCT 33.2* 31.6* 34.3*  MCV 86.9 87.1 87.1  PLT 252 325 353   Lab Results  Component Value Date   TSH 1.960 09/22/2013   Lab Results  Component Value Date   HGBA1C 6.3 (H) 10/24/2015   Lab Results  Component Value Date   CHOL 157 10/06/2012   HDL 34 (L) 10/06/2012   LDLCALC 96 10/06/2012   TRIG 135 10/06/2012   CHOLHDL 4.6 10/06/2012    Significant Diagnostic Results in last 30 days:  Dg Chest 2 View  Result Date: 02/17/2016 CLINICAL DATA:  Fatigue and weakness.  Recent pneumonia EXAM: CHEST  2 VIEW COMPARISON:  September 08, 2015 FINDINGS: There is focal airspace consolidation in the posterior left base region. The lungs elsewhere clear. Heart is borderline enlarged  with pulmonary vascularity within normal limits. No adenopathy. There are no bone lesions. IMPRESSION: Focal infiltrate consistent with pneumonia posterior left base. Lungs otherwise clear. Mild cardiomegaly. Followup PA and lateral chest radiographs recommended in 3-4 weeks following trial of antibiotic therapy to ensure resolution and exclude underlying malignancy. Electronically Signed   By: Bretta Bang III M.D.   On: 02/17/2016 19:12   Ct Head Wo Contrast  Result Date: 02/18/2016 CLINICAL DATA:  Acute onset of worsening generalized weakness and altered mental status. Initial encounter. EXAM: CT HEAD WITHOUT CONTRAST TECHNIQUE: Contiguous axial images were obtained from the base of the skull through the vertex without intravenous contrast. COMPARISON:  CT of the head performed 12/22/2014, and MRI of the brain performed 09/18/2013 FINDINGS: Brain: No evidence of acute infarction, hemorrhage, hydrocephalus, extra-axial collection or mass lesion/mass effect. Prominence of the ventricles and sulci reflects moderate cortical volume loss. Chronic lacunar infarcts are noted at the left cerebellar hemisphere. Scattered periventricular and subcortical white matter change likely reflects small vessel ischemic microangiopathy. The brainstem and fourth ventricle are within normal limits. The basal ganglia are unremarkable in appearance. The cerebral hemispheres demonstrate grossly normal gray-white differentiation. No mass effect or midline shift is seen. Vascular: No hyperdense vessel or unexpected calcification. Skull: There is no evidence of fracture; visualized osseous structures are unremarkable in appearance. Sinuses/Orbits: The orbits are within normal limits. The paranasal sinuses and mastoid air  cells are well-aerated. Other: No significant soft tissue abnormalities are seen. IMPRESSION: 1. No acute intracranial pathology seen on CT. 2. Moderate cortical volume loss and scattered small vessel ischemic  microangiopathy. 3. Chronic lacunar infarcts at the left cerebellar hemisphere. Electronically Signed   By: Roanna Raider M.D.   On: 02/18/2016 00:53   US Arterial Seg Multiple  Result Date: 02/25/2016 CLINICAL DATA:  Chronic bilateral lower extremity edema from venous stasis. Chronic left lower extremity venous ulcer. EXAM: NONINVASIVE PHYSIOLOGIC VASCULAR STUDY OF BILATERAL LOWER EXTREMITIES TECHNIQUE: Evaluation of both lower extremities was performed at rest, including calculation of ankle-brachial indices, multiple segmental pressure evaluation, segmental Doppler and segmental pulse volume recording. COMPARISON:  None. FINDINGS: Right ABI:  1.07 Left ABI:  1.15 Right Lower Extremity: Biphasic and triphasic Doppler waveforms in the right lower extremity. The PVR waveforms are within normal limits. Left Lower Extremity: Some of the Doppler waveforms are irregular. Triphasic Doppler waveforms in the left dorsalis pedis artery. PVR waveforms in the left calf and ankle are similar to the right ankle. Decreased PVR waveform amplitudes in the left thigh compared to the right side. IMPRESSION: Normal ankle-brachial indices. No significant peripheral arterial disease in lower extremities. Electronically Signed   By: Richarda Overlie M.D.   On: 02/25/2016 15:08    Assessment/Plan. #1 fall with no apparent acute injuries-however he has complained of some left hip and left arm pain the last couple days will order an x-ray of the areas to rule out any acute process.  Clinically he appears to be resting comfortably in bed denies any headache dizziness shortness of breath chest pain before during or after fall.  #2-history of anticoagulation management INR continues to be somewhat supratherapeutic will hold his Coumadin again tonight and recheck tomorrow-we will have to keep a close eye on this since he does have a history of elevated INRs in the past and has been put on Diflucan for his rash issues in the groin  area.  #3-systolic blood pressure in the 90s-again this was done by machine when I checked it manually it was more his baseline with a systolic at 120-will order blood pressures lying and sitting daily with a log for review to follow-up on this.  He is on low-dose metoprolol 12.5 mg twice a day as well as Cardizem 120 mg day-listed blood pressures previously appears stable 133/64-120/70-I do see a 150/67-at this point will monitor again manual blood pressure today was reassuring  [id number]      London Sheer, CMA [phone]

## 2016-03-12 ENCOUNTER — Encounter (HOSPITAL_COMMUNITY)
Admission: RE | Admit: 2016-03-12 | Discharge: 2016-03-12 | Disposition: A | Discharge status: Home / Self Care | Payer: Medicare Other | Source: Skilled Nursing Facility | Attending: *Deleted | Admitting: *Deleted

## 2016-03-12 DIAGNOSIS — I5032 Chronic diastolic (congestive) heart failure: Secondary | ICD-10-CM | POA: Diagnosis not present

## 2016-03-12 DIAGNOSIS — I739 Peripheral vascular disease, unspecified: Secondary | ICD-10-CM | POA: Diagnosis not present

## 2016-03-12 DIAGNOSIS — J449 Chronic obstructive pulmonary disease, unspecified: Secondary | ICD-10-CM | POA: Diagnosis not present

## 2016-03-12 DIAGNOSIS — Z7901 Long term (current) use of anticoagulants: Secondary | ICD-10-CM | POA: Diagnosis not present

## 2016-03-12 DIAGNOSIS — L97322 Non-pressure chronic ulcer of left ankle with fat layer exposed: Secondary | ICD-10-CM | POA: Diagnosis not present

## 2016-03-12 DIAGNOSIS — R293 Abnormal posture: Secondary | ICD-10-CM | POA: Diagnosis not present

## 2016-03-12 DIAGNOSIS — Z9181 History of falling: Secondary | ICD-10-CM | POA: Diagnosis not present

## 2016-03-12 LAB — PROTIME-INR
INR: 1.72
PROTHROMBIN TIME: 20.4 s — AB (ref 11.4–15.2)

## 2016-03-13 DIAGNOSIS — Z9181 History of falling: Secondary | ICD-10-CM | POA: Diagnosis not present

## 2016-03-13 DIAGNOSIS — I5032 Chronic diastolic (congestive) heart failure: Secondary | ICD-10-CM | POA: Diagnosis not present

## 2016-03-13 DIAGNOSIS — J449 Chronic obstructive pulmonary disease, unspecified: Secondary | ICD-10-CM | POA: Diagnosis not present

## 2016-03-13 DIAGNOSIS — R293 Abnormal posture: Secondary | ICD-10-CM | POA: Diagnosis not present

## 2016-03-16 DIAGNOSIS — R293 Abnormal posture: Secondary | ICD-10-CM | POA: Diagnosis not present

## 2016-03-16 DIAGNOSIS — I5032 Chronic diastolic (congestive) heart failure: Secondary | ICD-10-CM | POA: Diagnosis not present

## 2016-03-16 DIAGNOSIS — Z9181 History of falling: Secondary | ICD-10-CM | POA: Diagnosis not present

## 2016-03-16 DIAGNOSIS — J449 Chronic obstructive pulmonary disease, unspecified: Secondary | ICD-10-CM | POA: Diagnosis not present

## 2016-03-17 ENCOUNTER — Encounter (HOSPITAL_COMMUNITY)
Admission: RE | Admit: 2016-03-17 | Discharge: 2016-03-17 | Disposition: A | Discharge status: Home / Self Care | Payer: Medicare Other | Source: Skilled Nursing Facility | Attending: Internal Medicine | Admitting: Internal Medicine

## 2016-03-17 DIAGNOSIS — I5032 Chronic diastolic (congestive) heart failure: Secondary | ICD-10-CM | POA: Diagnosis not present

## 2016-03-17 DIAGNOSIS — Z7901 Long term (current) use of anticoagulants: Secondary | ICD-10-CM | POA: Diagnosis not present

## 2016-03-17 LAB — PROTIME-INR
INR: 3.27
Prothrombin Time: 34 seconds — ABNORMAL HIGH (ref 11.4–15.2)

## 2016-03-19 ENCOUNTER — Encounter (HOSPITAL_COMMUNITY)
Admission: RE | Admit: 2016-03-19 | Discharge: 2016-03-19 | Disposition: A | Discharge status: Home / Self Care | Payer: Medicare Other | Source: Skilled Nursing Facility | Attending: *Deleted | Admitting: *Deleted

## 2016-03-19 DIAGNOSIS — L97322 Non-pressure chronic ulcer of left ankle with fat layer exposed: Secondary | ICD-10-CM | POA: Diagnosis not present

## 2016-03-19 DIAGNOSIS — Z9181 History of falling: Secondary | ICD-10-CM | POA: Diagnosis not present

## 2016-03-19 DIAGNOSIS — Z7901 Long term (current) use of anticoagulants: Secondary | ICD-10-CM | POA: Diagnosis not present

## 2016-03-19 DIAGNOSIS — R293 Abnormal posture: Secondary | ICD-10-CM | POA: Diagnosis not present

## 2016-03-19 DIAGNOSIS — J449 Chronic obstructive pulmonary disease, unspecified: Secondary | ICD-10-CM | POA: Diagnosis not present

## 2016-03-19 DIAGNOSIS — I5032 Chronic diastolic (congestive) heart failure: Secondary | ICD-10-CM | POA: Diagnosis not present

## 2016-03-19 LAB — PROTIME-INR
INR: 3.23
Prothrombin Time: 33.7 seconds — ABNORMAL HIGH (ref 11.4–15.2)

## 2016-03-23 ENCOUNTER — Encounter (HOSPITAL_COMMUNITY)
Admission: RE | Admit: 2016-03-23 | Discharge: 2016-03-23 | Disposition: A | Discharge status: Home / Self Care | Payer: Medicare Other | Source: Skilled Nursing Facility | Attending: *Deleted | Admitting: *Deleted

## 2016-03-23 ENCOUNTER — Non-Acute Institutional Stay (SKILLED_NURSING_FACILITY): Payer: Medicare Other | Admitting: Internal Medicine

## 2016-03-23 DIAGNOSIS — I714 Abdominal aortic aneurysm, without rupture, unspecified: Secondary | ICD-10-CM

## 2016-03-23 DIAGNOSIS — I480 Paroxysmal atrial fibrillation: Secondary | ICD-10-CM | POA: Diagnosis not present

## 2016-03-23 DIAGNOSIS — Z7901 Long term (current) use of anticoagulants: Secondary | ICD-10-CM | POA: Diagnosis not present

## 2016-03-23 DIAGNOSIS — I5032 Chronic diastolic (congestive) heart failure: Secondary | ICD-10-CM | POA: Diagnosis not present

## 2016-03-23 LAB — PROTIME-INR
INR: 2.19
Prothrombin Time: 24.7 seconds — ABNORMAL HIGH (ref 11.4–15.2)

## 2016-03-23 NOTE — Progress Notes (Signed)
This is an acute visit.  Level care skilled.  Facility is [facility].  Chief complaint-acute visit follow-up on abdominal aortic aneurysm.  History of present was.  Patient is a pleasant 82 year old male with numerous medical issues including COPD-atrial fibrillation-chronic lymphedema-dementia.  He also has a history of a somewhat chronic infrarenal abdominal aortic aneurysm-this has been followed by vascular in the past-and thought to be stable.  Per chart review I see back in September 2016 me had a CT of the abdomen which showed stability at approximately 4.3 cm in diameter.  There was recommendation to follow up on this in approximately a year with an ultrasound.  I did discuss this with his daughter today-she is aware of the aneurysm-and says she was told by vascular that they were no longer need to follow this as long as he was in skilled nursing-and thus we will order an ultrasound to follow-up on this.  Currently he appears to be stable he is followed by psychiatric as well for his history of dementia with some behaviors at times-.  He is on Coumadin for a history of A. fib and INR is therapeutic today at 2.19-update INR is pending at times regulating his INR has been somewhat challenging with subtherapeutic as well as supratherapeutic readings.  There's been no increased bruising or bleeding.  Past Medical History:  Diagnosis Date  . AAA (abdominal aortic aneurysm) (HCC)   . AAA (abdominal aortic aneurysm) without rupture (HCC) 10/06/2012  . Anticoagulation goal of INR 2 to 3 06/01/2013  . Arthritis    "probably on the right leg/hip" (09/20/2013)  . Atrial fibrillation (HCC)   . B12 deficiency 09/20/2013  . Benign localized hyperplasia of prostate with urinary retention 12/23/2014  . Bladder calculus 12/23/2014   2.6cm and present since at least 2011 when it was about 1.6cm.   [name] BPH (benign prostatic hyperplasia)   . Bronchospasm   . Cardiomyopathy (HCC)  10/15/2014  . Cellulitis   . CHF (congestive heart failure) (HCC) 09/26/2013  . Chronic venous insufficiency 10/06/2012  . Cognitive impairment   . COPD (chronic obstructive pulmonary disease) (HCC)   . Daily headache   . Dementia    "don't know exactly what kind" (09/20/2013)  . DVT (deep venous thrombosis) (HCC) 05/04/2013   "LLE"  . E. coli UTI 11/17/15   Cipro X 7 days  . Edema-Bilat Leg and foot 03/07/2013  . Elevated PSA   . Fall at home September 18, 2013  . Glaucoma, both eyes   . Kidney stones   . Multifactorial dementia 10/06/2012  . Nicotine dependence   . Obesity, unspecified 10/06/2012  . Peripheral vascular disease (HCC) 09/20/2012  . Pyelonephritis, acute 12/22/2014  . Ulcer (HCC)   . Venous insufficiency   . Weakness 09/18/2013  . Wound cellulitis    let ankle wound size of half dollar per nurse at [facility] on 03/25/2015   Past Surgical History:  Procedure Laterality Date  . BACK SURGERY    . CATARACT EXTRACTION W/ INTRAOCULAR LENS  IMPLANT, BILATERAL Bilateral   . CHOLECYSTECTOMY  2011   Gall Bladder  . CYSTOSCOPY W/ STONE MANIPULATION  1970's   "once"  . CYSTOSCOPY WITH LITHOLAPAXY N/A 03/28/2015   Procedure: CYSTOSCOPY WITH LITHOLAPAXY;  Surgeon: Malen Gauze, MD;  Location: [facility];  Service: Urology;  Laterality: N/A;  . EYE SURGERY    . FEMUR FRACTURE SURGERY Right 04/1985  . HIP FRACTURE SURGERY Right 2009  . INSITU PERCUTANEOUS  PINNING FEMUR Right 1986  . POSTERIOR LAMINECTOMY / DECOMPRESSION LUMBAR SPINE  2004   Decompressive laminectomy unilaterally on the right at L4-5 with  . SPINE SURGERY    . TRANSURETHRAL RESECTION OF PROSTATE N/A 03/28/2015   Procedure: TRANSURETHRAL RESECTION OF THE PROSTATE ;  Surgeon: Malen Gauze, MD;  Location: [facility];  Service: Urology;  Laterality: N/A;         Allergies  Allergen Reactions  . Oxycodone Hcl Other (See Comments)     makes pt "wild"  . Propoxyphene  N-Acetaminophen Other (See Comments)     Makes pt. "wild"          Medication List                    acetaminophen 500 MG tablet Commonly known as:  TYLENOL Take 1,000 mg by mouth every 6 (six) hours as needed for mild pain or fever.  diltiazem 120 MG 24 hr capsule Commonly known as:  CARDIZEM CD Take 120 mg by mouth every morning.  divalproex 250 MG DR tablet Commonly known as:  DEPAKOTE Give 250 mg by mouth once a day. Give 500 mg by mouth at bedtime  doxazosin 8 MG tablet Commonly known as:  CARDURA Take 8 mg by mouth every morning.  feeding supplement (PRO-STAT SUGAR FREE 64) Liqd Take 30 mLs by mouth 2 (two) times daily between meals.  fluconazole 200 MG tablet Commonly known as:  DIFLUCAN Take 200 mg by mouth daily. Give X 5 days stop on 03/14/16  Fluticasone-Salmeterol 250-50 MCG/DOSE Aepb Commonly known as:  ADVAIR Inhale 1 puff into the lungs 2 (two) times daily.  ipratropium-albuterol 0.5-2.5 (3) MG/3ML Soln Commonly known as:  DUONEB 3ml via Neb inhalation three times a day for congestion scheduled and 4 times daily as needed for SOB or congestion  latanoprost 0.005 % ophthalmic solution Commonly known as:  XALATAN Place 1 drop into both eyes at bedtime.  methenamine 1 g tablet Commonly known as:  HIPREX Take 1 g by mouth 2 (two) times daily with a meal.  metoprolol tartrate 25 MG tablet Commonly known as:  LOPRESSOR Take 0.5 tablets (12.5 mg total) by mouth 2 (two) times daily.  SANTYL ointment Generic drug:  collagenase Apply 1 application topically daily.  sertraline 25 MG tablet Commonly known as:  ZOLOFT Take 75 mg by mouth daily. Take three tablets by mouth once daily  triamcinolone cream 0.1 % Commonly known as:  KENALOG Apply 1 application topically every Monday, Wednesday, and Friday. Apply triamcinolone 0.1 % and 1/2 cetaphil cream  to both Rt. and Lt. leg before wrapping on Mon,Wed,Fri.  warfarin 4 MG tablet Commonly known as:   COUMADIN Give 4 mg along with 0.5 mg to equal 4.5 mg once an evening  warfarin 1 MG tablet Commonly known as:  COUMADIN Give 0.5 mg along with 4 mg to equal 4.5 mg once a evening      Review of Systems   Limited secondary to dementia currently he is not complaining of any discomfort a    Skin-no increased bruising or bleeding has been noted  Head ears eyes nose mouth and throat-does not complain of visual changes or sore throat  Respiratory Denies chest pain shortness of breath as well  Cardiac does not complain of chest pain does have chronic lymphedema in legs are currently wrapped.  He is not complaining of any nausea vomiting diarrhea constipation or abdominal discomfort.  GU has a chronic indwelling  Foley catheter does not complain of dysuria.  Musculoskeletal is not complaining of joint pain currently does have significant lower extremity weakness with lymphedema.  Neurologic is not complaining of dizziness or headache      Immunization History  Administered Date(s) Administered  . Influenza,inj,Quad PF,36+ Mos 03/20/2013, 03/23/2014, 02/11/2015  . Influenza-Unspecified 03/05/2016  . PPD Test 12/26/2014, 01/09/2015  . Pneumococcal Polysaccharide-23 03/02/2007, 02/11/2015  . Td 12/30/1996       Pertinent  Health Maintenance Due  Topic Date Due  . PNA vac Low Risk Adult (2 of 2 - PCV13) 02/11/2016  . INFLUENZA VACCINE  Completed   Fall Risk  01/04/2015 04/18/2014 08/15/2013  Falls in the past year? Yes Yes Yes  Number falls in past yr: 2 or more 1 2 or more  Injury with Fall? No Yes -  Risk Factor Category  - High Fall Risk High Fall Risk  Risk for fall due to : Impaired balance/gait;Impaired mobility Impaired mobility;Mental status change History of fall(s);Impaired balance/gait;Impaired mobility;Mental status change   Functional Status Survey:   Temperature 97.0 pulse 61 respirations 20 blood pressure 123/84      In general this is a  somewhat obese elderly male in no distress and comfortably in his wheelchair.  His skin is warm and dry he does have some bruising most apparent arms bilaterally This appears to be chronic      He has venous stasis changes his left lower leg right lower leg both legs are currently wrapped   Eyes sclera and conjunctiva clear visual acuity appears grossly intact.  Oropharynx is clear mucous membranes moist.  Chest is clear to auscultation there is no labored breathing has somewhat reduced air entry.  Heart is regular rate and rhythm with an occasional irregular beat he has again venous stasis changes lower extremities bilaterally.-Has a 2/6 systolic murmur  Abdomen is obese soft nontender with positive bowel sounds   GU he does have a Foley catheter draining  dark amber colored urine.  Musculoskeletal is able to move all extremities 4 I did flex and extend at the hip he does not really appear  With chronic lower extremity weakness with lymphedema-has limited range of motion at shoulders bilaterally -- is chronic.  Neurologic is grossly intact to speech is clear no lateralizing findings.  Psyche is oriented to self is pleasant and appropriate with somewhat of a flat affect   Labs reviewed:  RecentLabs(withinlast365days)   Recent Labs  01/15/16 0710 02/14/16 0710 02/17/16 1909  NA 142 143 139  K 4.7 3.6 3.8  CL 110 111 105  CO2 29 26 26   GLUCOSE 103* 100* 100*  BUN 29* 31* 29*  CREATININE 1.30* 1.11 1.04  CALCIUM 8.7* 8.7* 8.8*      RecentLabs(withinlast365days)   Recent Labs  11/14/15 2312 02/14/16 0710 02/17/16 2351  AST 17 17 25   ALT 14* 15* 21  ALKPHOS 59 59 54  BILITOT 0.6 0.5 0.3  PROT 6.5 6.6 7.0  ALBUMIN 3.1* 2.7* 2.8*      RecentLabs(withinlast365days)   Recent Labs  01/15/16 0710 02/14/16 0710 02/17/16 1909  WBC 7.6 10.4 8.4  NEUTROABS 3.1 6.7 4.1  HGB 10.6* 9.9* 10.5*  HCT 33.2* 31.6* 34.3*    MCV 86.9 87.1 87.1  PLT 252 325 353     RecentLabs       Lab Results  Component Value Date   TSH 1.960 09/22/2013     RecentLabs       Lab Results  Component Value Date   HGBA1C 6.3 (H) 10/24/2015     RecentLabs       Lab Results  Component Value Date   CHOL 157 10/06/2012   HDL 34 (L) 10/06/2012   LDLCALC 96 10/06/2012   TRIG 135 10/06/2012   CHOLHDL 4.6 10/06/2012      Significant Diagnostic Results in last 30 days:   ImagingResults  Dg Chest 2 View  Result Date: 02/17/2016 CLINICAL DATA:  Fatigue and weakness.  Recent pneumonia EXAM: CHEST  2 VIEW COMPARISON:  September 08, 2015 FINDINGS: There is focal airspace consolidation in the posterior left base region. The lungs elsewhere clear. Heart is borderline enlarged with pulmonary vascularity within normal limits. No adenopathy. There are no bone lesions. IMPRESSION: Focal infiltrate consistent with pneumonia posterior left base. Lungs otherwise clear. Mild cardiomegaly. Followup PA and lateral chest radiographs recommended in 3-4 weeks following trial of antibiotic therapy to ensure resolution and exclude underlying malignancy. Electronically Signed   By: Bretta Bang III M.D.   On: 02/17/2016 19:12   Ct Head Wo Contrast  Result Date: 02/18/2016 CLINICAL DATA:  Acute onset of worsening generalized weakness and altered mental status. Initial encounter. EXAM: CT HEAD WITHOUT CONTRAST TECHNIQUE: Contiguous axial images were obtained from the base of the skull through the vertex without intravenous contrast. COMPARISON:  CT of the head performed 12/22/2014, and MRI of the brain performed 09/18/2013 FINDINGS: Brain: No evidence of acute infarction, hemorrhage, hydrocephalus, extra-axial collection or mass lesion/mass effect. Prominence of the ventricles and sulci reflects moderate cortical volume loss. Chronic lacunar infarcts are noted at the left cerebellar hemisphere. Scattered periventricular and  subcortical white matter change likely reflects small vessel ischemic microangiopathy. The brainstem and fourth ventricle are within normal limits. The basal ganglia are unremarkable in appearance. The cerebral hemispheres demonstrate grossly normal gray-white differentiation. No mass effect or midline shift is seen. Vascular: No hyperdense vessel or unexpected calcification. Skull: There is no evidence of fracture; visualized osseous structures are unremarkable in appearance. Sinuses/Orbits: The orbits are within normal limits. The paranasal sinuses and mastoid air cells are well-aerated. Other: No significant soft tissue abnormalities are seen. IMPRESSION: 1. No acute intracranial pathology seen on CT. 2. Moderate cortical volume loss and scattered small vessel ischemic microangiopathy. 3. Chronic lacunar infarcts at the left cerebellar hemisphere. Electronically Signed   By: Roanna Raider M.D.   On: 02/18/2016 00:53   US Arterial Seg Multiple  Result Date: 02/25/2016 CLINICAL DATA:  Chronic bilateral lower extremity edema from venous stasis. Chronic left lower extremity venous ulcer. EXAM: NONINVASIVE PHYSIOLOGIC VASCULAR STUDY OF BILATERAL LOWER EXTREMITIES TECHNIQUE: Evaluation of both lower extremities was performed at rest, including calculation of ankle-brachial indices, multiple segmental pressure evaluation, segmental Doppler and segmental pulse volume recording. COMPARISON:  None. FINDINGS: Right ABI:  1.07 Left ABI:  1.15 Right Lower Extremity: Biphasic and triphasic Doppler waveforms in the right lower extremity. The PVR waveforms are within normal limits. Left Lower Extremity: Some of the Doppler waveforms are irregular. Triphasic Doppler waveforms in the left dorsalis pedis artery. PVR waveforms in the left calf and ankle are similar to the right ankle. Decreased PVR waveform amplitudes in the left thigh compared to the right side. IMPRESSION: Normal ankle-brachial indices. No significant  peripheral arterial disease in lower extremities. Electronically Signed   By: Richarda Overlie M.D.   On: 02/25/2016 15:08     Assessment and plan.  #1 history of infrarenal abdominal aortic aneurysm-as stated above this was discussed with  his daughter we will go ahead and order an ultrasound for follow-up last CT scan was done on 02/10/2015 which showed a diameter of 4.3 cm-this plan was discussed with Dr.Gupta   Clinically he appears stable in this regards.  In regards to-ambulation he is on Lopressor as well as Cardizem-rate appears to be controlled-update INR is pending INR is now therapeutic at 2.19.  [id number] note greater than 25 minutes spent assessing patient-discussing his status with his daughter-reviewing previous CT scans results and labs-and coordinating plan of care with discussion with daughter as well as with Dr.Gupta

## 2016-03-24 ENCOUNTER — Encounter: Payer: Self-pay | Admitting: Internal Medicine

## 2016-03-24 NOTE — Progress Notes (Signed)
Location:   [facility] Room Number: 121/W Place of Service:  SNF (31) Provider:  Anjali,Gupta  No primary care provider on file.  Patient Care Team: [name], MD as Attending Physician (Urology) [name], MD as Consulting Physician (Geriatric Medicine)  Extended Emergency Contact Information Primary Emergency Contact: [name],[name] Address: [address]          [place], [place] [address] [name] of [place] Home Phone: [phone] Mobile Phone: [phone] Relation: Daughter Secondary Emergency Contact: Alderman,[name] Address: [address]          [place], [place] [address] [name] of [place] Home Phone: [phone] Mobile Phone: [phone] Relation: Son  Code Status:  DNR Goals of care: Advanced Directive information Advanced Directives 03/24/2016  Does patient have an advance directive? Yes  Type of Advance Directive Out of facility DNR (pink MOST or yellow form)  Does patient want to make changes to advanced directive? No - Patient declined  Copy of advanced directive(s) in chart? Yes  Would patient like information on creating an advanced directive? -  Pre-existing out of facility DNR order (yellow form or pink MOST form) -     Chief Complaint  Patient presents with  . Medical Management of Chronic Issues    Routine Visit    HPI:  Pt is a 82 y.o. male seen today for medical management of chronic diseases.     Past Medical History:  Diagnosis Date  . AAA (abdominal aortic aneurysm) (HCC)   . AAA (abdominal aortic aneurysm) without rupture (HCC) 10/06/2012  . Anticoagulation goal of INR 2 to 3 06/01/2013  . Arthritis    "probably on the right leg/hip" (09/20/2013)  . Atrial fibrillation (HCC)   . B12 deficiency 09/20/2013  . Benign localized hyperplasia of prostate with urinary retention 12/23/2014  . Bladder calculus 12/23/2014   2.6cm and present since at least 2011 when it was about 1.6cm.   [name] BPH (benign prostatic  hyperplasia)   . Bronchospasm   . Cardiomyopathy (HCC) 10/15/2014  . Cellulitis   . CHF (congestive heart failure) (HCC) 09/26/2013  . Chronic venous insufficiency 10/06/2012  . Cognitive impairment   . COPD (chronic obstructive pulmonary disease) (HCC)   . Daily headache   . Dementia    "don't know exactly what kind" (09/20/2013)  . DVT (deep venous thrombosis) (HCC) 05/04/2013   "LLE"  . E. coli UTI 11/17/15   Cipro X 7 days  . Edema-Bilat Leg and foot 03/07/2013  . Elevated PSA   . Fall at home September 18, 2013  . Glaucoma, both eyes   . Kidney stones   . Multifactorial dementia 10/06/2012  . Nicotine dependence   . Obesity, unspecified 10/06/2012  . Peripheral vascular disease (HCC) 09/20/2012  . Pyelonephritis, acute 12/22/2014  . Ulcer (HCC)   . Venous insufficiency   . Weakness 09/18/2013  . Wound cellulitis    let ankle wound size of half dollar per nurse at [facility] on 03/25/2015   Past Surgical History:  Procedure Laterality Date  . BACK SURGERY    . CATARACT EXTRACTION W/ INTRAOCULAR LENS  IMPLANT, BILATERAL Bilateral   . CHOLECYSTECTOMY  2011   Gall Bladder  . CYSTOSCOPY W/ STONE MANIPULATION  1970's   "once"  . CYSTOSCOPY WITH LITHOLAPAXY N/A 03/28/2015   Procedure: CYSTOSCOPY WITH LITHOLAPAXY;  Surgeon: Malen Gauze, MD;  Location: [facility];  Service: Urology;  Laterality: N/A;  . EYE SURGERY    . FEMUR  FRACTURE SURGERY Right 04/1985  . HIP FRACTURE SURGERY Right 2009  . INSITU PERCUTANEOUS PINNING FEMUR Right 1986  . POSTERIOR LAMINECTOMY / DECOMPRESSION LUMBAR SPINE  2004   Decompressive laminectomy unilaterally on the right at L4-5 with  . SPINE SURGERY    . TRANSURETHRAL RESECTION OF PROSTATE N/A 03/28/2015   Procedure: TRANSURETHRAL RESECTION OF THE PROSTATE ;  Surgeon: Malen GauzePatrick L McKenzie, MD;  Location: [facility];  Service: Urology;  Laterality: N/A;    Allergies  Allergen Reactions  . Oxycodone Hcl Other (See Comments)     makes pt "wild"  .  Propoxyphene N-Acetaminophen Other (See Comments)     Makes pt. "wild"      Medication List       Accurate as of 03/24/16 12:43 PM. Always use your most recent med list.          acetaminophen 500 MG tablet Commonly known as:  TYLENOL Take 1,000 mg by mouth every 6 (six) hours as needed for mild pain or fever.   clotrimazole-betamethasone cream Commonly known as:  LOTRISONE Apply to groin/scrotum rash till resolved every shift   diltiazem 120 MG 24 hr capsule Commonly known as:  CARDIZEM CD Take 120 mg by mouth every morning.   divalproex 250 MG DR tablet Commonly known as:  DEPAKOTE Give 250 mg by mouth once a day. Give 500 mg by mouth at bedtime   doxazosin 8 MG tablet Commonly known as:  CARDURA Take 8 mg by mouth every morning.   feeding supplement (PRO-STAT SUGAR FREE 64) Liqd Take 30 mLs by mouth 2 (two) times daily between meals.   Fluticasone-Salmeterol 250-50 MCG/DOSE Aepb Commonly known as:  ADVAIR Inhale 1 puff into the lungs 2 (two) times daily.   ipratropium-albuterol 0.5-2.5 (3) MG/3ML Soln Commonly known as:  DUONEB 3ml via Neb inhalation three times a day for congestion scheduled and 4 times daily as needed for SOB or congestion   latanoprost 0.005 % ophthalmic solution Commonly known as:  XALATAN Place 1 drop into both eyes at bedtime.   methenamine 1 g tablet Commonly known as:  HIPREX Take 1 g by mouth 2 (two) times daily with a meal.   metoprolol tartrate 25 MG tablet Commonly known as:  LOPRESSOR Take 0.5 tablets (12.5 mg total) by mouth 2 (two) times daily.   SANTYL ointment Generic drug:  collagenase Apply 1 application topically daily.   sertraline 25 MG tablet Commonly known as:  ZOLOFT Take 75 mg by mouth daily. Take three tablets by mouth once daily   triamcinolone cream 0.1 % Commonly known as:  KENALOG Apply 1 application topically every Monday, Wednesday, and Friday. Apply triamcinolone 0.1 % and 1/2 cetaphil cream  to  both Rt. and Lt. leg before wrapping on Mon,Wed,Fri.   warfarin 4 MG tablet Commonly known as:  COUMADIN Give 4 mg  once an evening       Review of Systems  Immunization History  Administered Date(s) Administered  . Influenza,inj,Quad PF,36+ Mos 03/20/2013, 03/23/2014, 02/11/2015  . Influenza-Unspecified 03/05/2016  . PPD Test 12/26/2014, 01/09/2015  . Pneumococcal Polysaccharide-23 03/02/2007, 02/11/2015  . Pneumococcal-Unspecified 03/09/2016  . Td 12/30/1996   Pertinent  Health Maintenance Due  Topic Date Due  . PNA vac Low Risk Adult (2 of 2 - PCV13) 03/09/2017  . INFLUENZA VACCINE  Completed   Fall Risk  01/04/2015 04/18/2014 08/15/2013  Falls in the past year? Yes Yes Yes  Number falls in past yr: 2 or more 1 2  or more  Injury with Fall? No Yes -  Risk Factor Category  - High Fall Risk High Fall Risk  Risk for fall due to : Impaired balance/gait;Impaired mobility Impaired mobility;Mental status change History of fall(s);Impaired balance/gait;Impaired mobility;Mental status change   Functional Status Survey:    Vitals:   03/24/16 1155  BP: (!) 154/71  Pulse: 84  Resp: 20  Temp: 97.7 F (36.5 C)  TempSrc: Oral   There is no height or weight on file to calculate BMI. Physical Exam  Labs reviewed:  Recent Labs  01/15/16 0710 02/14/16 0710 02/17/16 1909  NA 142 143 139  K 4.7 3.6 3.8  CL 110 111 105  CO2 29 26 26   GLUCOSE 103* 100* 100*  BUN 29* 31* 29*  CREATININE 1.30* 1.11 1.04  CALCIUM 8.7* 8.7* 8.8*    Recent Labs  11/14/15 2312 02/14/16 0710 02/17/16 2351  AST 17 17 25   ALT 14* 15* 21  ALKPHOS 59 59 54  BILITOT 0.6 0.5 0.3  PROT 6.5 6.6 7.0  ALBUMIN 3.1* 2.7* 2.8*    Recent Labs  01/15/16 0710 02/14/16 0710 02/17/16 1909  WBC 7.6 10.4 8.4  NEUTROABS 3.1 6.7 4.1  HGB 10.6* 9.9* 10.5*  HCT 33.2* 31.6* 34.3*  MCV 86.9 87.1 87.1  PLT 252 325 353   Lab Results  Component Value Date   TSH 1.960 09/22/2013   Lab Results    Component Value Date   HGBA1C 6.3 (H) 10/24/2015   Lab Results  Component Value Date   CHOL 157 10/06/2012   HDL 34 (L) 10/06/2012   LDLCALC 96 10/06/2012   TRIG 135 10/06/2012   CHOLHDL 4.6 10/06/2012    Significant Diagnostic Results in last 30 days:  US Arterial Seg Multiple  Result Date: 02/25/2016 CLINICAL DATA:  Chronic bilateral lower extremity edema from venous stasis. Chronic left lower extremity venous ulcer. EXAM: NONINVASIVE PHYSIOLOGIC VASCULAR STUDY OF BILATERAL LOWER EXTREMITIES TECHNIQUE: Evaluation of both lower extremities was performed at rest, including calculation of ankle-brachial indices, multiple segmental pressure evaluation, segmental Doppler and segmental pulse volume recording. COMPARISON:  None. FINDINGS: Right ABI:  1.07 Left ABI:  1.15 Right Lower Extremity: Biphasic and triphasic Doppler waveforms in the right lower extremity. The PVR waveforms are within normal limits. Left Lower Extremity: Some of the Doppler waveforms are irregular. Triphasic Doppler waveforms in the left dorsalis pedis artery. PVR waveforms in the left calf and ankle are similar to the right ankle. Decreased PVR waveform amplitudes in the left thigh compared to the right side. IMPRESSION: Normal ankle-brachial indices. No significant peripheral arterial disease in lower extremities. Electronically Signed   By: Richarda Overlie M.D.   On: 02/25/2016 15:08    Assessment/Plan There are no diagnoses linked to this encounter.   Family/ staff Communication:   Labs/tests ordered:       This encounter was created in error - please disregard.

## 2016-03-25 ENCOUNTER — Encounter: Payer: Self-pay | Admitting: Internal Medicine

## 2016-03-25 ENCOUNTER — Non-Acute Institutional Stay (SKILLED_NURSING_FACILITY): Payer: Medicare Other | Admitting: Internal Medicine

## 2016-03-25 ENCOUNTER — Other Ambulatory Visit (HOSPITAL_COMMUNITY)
Admission: AD | Admit: 2016-03-25 | Discharge: 2016-03-25 | Disposition: A | Discharge status: Home / Self Care | Payer: Medicare Other | Source: Skilled Nursing Facility | Attending: Internal Medicine | Admitting: Internal Medicine

## 2016-03-25 DIAGNOSIS — R0989 Other specified symptoms and signs involving the circulatory and respiratory systems: Secondary | ICD-10-CM | POA: Diagnosis not present

## 2016-03-25 DIAGNOSIS — D500 Iron deficiency anemia secondary to blood loss (chronic): Secondary | ICD-10-CM | POA: Diagnosis not present

## 2016-03-25 DIAGNOSIS — R293 Abnormal posture: Secondary | ICD-10-CM | POA: Diagnosis not present

## 2016-03-25 DIAGNOSIS — I10 Essential (primary) hypertension: Secondary | ICD-10-CM | POA: Insufficient documentation

## 2016-03-25 DIAGNOSIS — J449 Chronic obstructive pulmonary disease, unspecified: Secondary | ICD-10-CM | POA: Diagnosis not present

## 2016-03-25 DIAGNOSIS — I5032 Chronic diastolic (congestive) heart failure: Secondary | ICD-10-CM | POA: Diagnosis not present

## 2016-03-25 DIAGNOSIS — Z9181 History of falling: Secondary | ICD-10-CM | POA: Diagnosis not present

## 2016-03-25 DIAGNOSIS — R4182 Altered mental status, unspecified: Secondary | ICD-10-CM

## 2016-03-25 DIAGNOSIS — E876 Hypokalemia: Secondary | ICD-10-CM | POA: Diagnosis not present

## 2016-03-25 LAB — CBC WITH DIFFERENTIAL/PLATELET
BASOS ABS: 0 10*3/uL (ref 0.0–0.1)
Basophils Relative: 0 %
Eosinophils Absolute: 0.4 10*3/uL (ref 0.0–0.7)
Eosinophils Relative: 5 %
HEMATOCRIT: 36.1 % — AB (ref 39.0–52.0)
Hemoglobin: 11.2 g/dL — ABNORMAL LOW (ref 13.0–17.0)
LYMPHS ABS: 1.3 10*3/uL (ref 0.7–4.0)
LYMPHS PCT: 15 %
MCH: 27.6 pg (ref 26.0–34.0)
MCHC: 31 g/dL (ref 30.0–36.0)
MCV: 88.9 fL (ref 78.0–100.0)
MONO ABS: 1.1 10*3/uL — AB (ref 0.1–1.0)
Monocytes Relative: 13 %
NEUTROS ABS: 5.8 10*3/uL (ref 1.7–7.7)
Neutrophils Relative %: 67 %
Platelets: 279 10*3/uL (ref 150–400)
RBC: 4.06 MIL/uL — AB (ref 4.22–5.81)
RDW: 17.7 % — ABNORMAL HIGH (ref 11.5–15.5)
WBC: 8.6 10*3/uL (ref 4.0–10.5)

## 2016-03-25 LAB — COMPREHENSIVE METABOLIC PANEL
ALT: 13 U/L — AB (ref 17–63)
AST: 21 U/L (ref 15–41)
Albumin: 3.1 g/dL — ABNORMAL LOW (ref 3.5–5.0)
Alkaline Phosphatase: 54 U/L (ref 38–126)
Anion gap: 5 (ref 5–15)
BILIRUBIN TOTAL: 0.6 mg/dL (ref 0.3–1.2)
BUN: 28 mg/dL — AB (ref 6–20)
CALCIUM: 8.9 mg/dL (ref 8.9–10.3)
CO2: 29 mmol/L (ref 22–32)
CREATININE: 1.02 mg/dL (ref 0.61–1.24)
Chloride: 109 mmol/L (ref 101–111)
GFR calc Af Amer: >60 mL/min (ref >60)
Glucose, Bld: 103 mg/dL — ABNORMAL HIGH (ref 65–99)
Potassium: 3.3 mmol/L — ABNORMAL LOW (ref 3.5–5.1)
Sodium: 143 mmol/L (ref 135–145)
TOTAL PROTEIN: 7 g/dL (ref 6.5–8.1)

## 2016-03-25 LAB — AMMONIA: AMMONIA: 19 umol/L (ref 9–35)

## 2016-03-25 NOTE — Progress Notes (Signed)
Location:   [facility] Room Number: 121/W Place of Service:  SNF (31) Provider:  Edmon Crape  No primary care provider on file.  Patient Care Team: [name], MD as Attending Physician (Urology) [name], MD as Consulting Physician (Geriatric Medicine)  Extended Emergency Contact Information Primary Emergency Contact: [name],[name] Address: [address]          [place], [place] [address] [name] of [place] Home Phone: [phone] Mobile Phone: [phone] Relation: Daughter Secondary Emergency Contact: Sullenger,[name] Address: [address]          [place], [place] [address] [name] of [place] Home Phone: [phone] Mobile Phone: [phone] Relation: Son  Code Status:  DNR Goals of care: Advanced Directive information Advanced Directives 03/25/2016  Does patient have an advance directive? Yes  Type of Advance Directive Out of facility DNR (pink MOST or yellow form)  Does patient want to make changes to advanced directive? No - Patient declined  Copy of advanced directive(s) in chart? Yes  Would patient like information on creating an advanced directive? -  Pre-existing out of facility DNR order (yellow form or pink MOST form) -     Chief Complaint  Patient presents with  . Acute Visit  Secondary to increased confusion  HPI:  Pt is a 82 y.o. male seen today for an acute visit for increased confusion per nursing staff.  Nurses morning states that he appears more confused and actually asked what room Arik Husmann was in--not realizing that he was Vladimir Crofts.  Apparently he did not eat all that well this morning either although when I entered the room he was eating a candy bar.  He does have a history of dementia with some behaviors at times.  He also has history of COPD is on nebulizers-nursing staff feels he is having some increased congestion today.  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 AAA (abdominal aortic aneurysm) (HCC)   . AAA (abdominal aortic aneurysm) without rupture (HCC) 10/06/2012  . Anticoagulation goal of INR 2 to 3 06/01/2013  . Arthritis    "probably on the right leg/hip" (09/20/2013)  . Atrial fibrillation (HCC)   . B12 deficiency 09/20/2013  . Benign localized hyperplasia of prostate with urinary retention 12/23/2014  . Bladder calculus 12/23/2014   2.6cm and present since at least 2011 when it was about 1.6cm.   [name] BPH (benign prostatic hyperplasia)   . Bronchospasm   . Cardiomyopathy (HCC) 10/15/2014  . Cellulitis   . CHF (congestive heart failure) (HCC) 09/26/2013  . Chronic venous insufficiency 10/06/2012  . Cognitive impairment   . COPD (chronic obstructive pulmonary disease) (HCC)   . Daily headache   . Dementia    "don't know exactly what kind" (09/20/2013)  . DVT (deep venous thrombosis) (HCC) 05/04/2013   "LLE"  . E. coli UTI 11/17/15   Cipro X 7  days  . Edema-Bilat Leg and foot 03/07/2013  . Elevated PSA   . Fall at home September 18, 2013  . Glaucoma, both eyes   . Kidney stones   . Multifactorial dementia 10/06/2012  . Nicotine dependence   . Obesity, unspecified 10/06/2012  . Peripheral vascular disease (HCC) 09/20/2012  . Pyelonephritis, acute 12/22/2014  . Ulcer (HCC)   . Venous insufficiency   . Weakness 09/18/2013  . Wound cellulitis    let ankle wound size of half dollar per nurse at [facility] on 03/25/2015   Past Surgical History:  Procedure Laterality Date  .  BACK SURGERY    . CATARACT EXTRACTION W/ INTRAOCULAR LENS  IMPLANT, BILATERAL Bilateral   . CHOLECYSTECTOMY  2011   Gall Bladder  . CYSTOSCOPY W/ STONE MANIPULATION  1970's   "once"  . CYSTOSCOPY WITH LITHOLAPAXY N/A 03/28/2015   Procedure: CYSTOSCOPY WITH LITHOLAPAXY;  Surgeon: Malen GauzePatrick L McKenzie, MD;  Location: [facility];  Service: Urology;  Laterality: N/A;  . EYE SURGERY    . FEMUR FRACTURE SURGERY Right 04/1985  . HIP FRACTURE SURGERY Right 2009  . INSITU PERCUTANEOUS PINNING FEMUR Right 1986  . POSTERIOR LAMINECTOMY / DECOMPRESSION LUMBAR SPINE  2004   Decompressive laminectomy unilaterally on the right at L4-5 with  . SPINE SURGERY    . TRANSURETHRAL RESECTION OF PROSTATE N/A 03/28/2015   Procedure: TRANSURETHRAL RESECTION OF THE PROSTATE ;  Surgeon: Malen GauzePatrick L McKenzie, MD;  Location: [facility];  Service: Urology;  Laterality: N/A;    Allergies  Allergen Reactions  . Oxycodone Hcl Other (See Comments)     makes pt "wild"  . Propoxyphene N-Acetaminophen Other (See Comments)     Makes pt. "wild"      Medication List       Accurate as of 03/25/16 11:52 AM. Always use your most recent med list.          acetaminophen 500 MG tablet Commonly known as:  TYLENOL Take 1,000 mg by mouth every 6 (six) hours as needed for mild pain or fever.   clotrimazole-betamethasone cream Commonly known as:  LOTRISONE Apply to groin/scrotum rash till resolved every shift   diltiazem 120 MG 24 hr capsule Commonly known as:  CARDIZEM CD Take 120 mg by mouth every morning.   divalproex 250 MG DR tablet Commonly known as:  DEPAKOTE Give 250 mg by mouth once a day. Give 500 mg by mouth at bedtime   doxazosin 8 MG tablet Commonly known as:  CARDURA Take 8 mg by mouth every morning.   feeding supplement (PRO-STAT SUGAR FREE 64) Liqd Take 30 mLs by mouth 2 (two) times daily between meals.   Fluticasone-Salmeterol 250-50 MCG/DOSE Aepb Commonly known as:  ADVAIR Inhale 1 puff into the  lungs 2 (two) times daily.   ipratropium-albuterol 0.5-2.5 (3) MG/3ML Soln Commonly known as:  DUONEB 3ml via Neb inhalation three times a day for congestion scheduled and 4 times daily as needed for SOB or congestion   latanoprost 0.005 % ophthalmic solution Commonly known as:  XALATAN Place 1 drop into both eyes at bedtime.   methenamine 1 g tablet Commonly known as:  HIPREX Take 1 g by mouth 2 (two) times daily with a meal.   metoprolol tartrate 25 MG tablet Commonly known as:  LOPRESSOR Take 0.5 tablets (12.5 mg total) by mouth 2 (two) times daily.   MILK OF MAGNESIA PO Give 30 ml by mouth daily as needed for constipation may use fleets enema  SANTYL ointment Generic drug:  collagenase Apply 1 application topically daily.   sertraline 25 MG tablet Commonly known as:  ZOLOFT Take 75 mg by mouth daily. Take three tablets by mouth once daily   triamcinolone cream 0.1 % Commonly known as:  KENALOG Apply 1 application topically every Monday, Wednesday, and Friday. Apply triamcinolone 0.1 % and 1/2 cetaphil cream  to both Rt. and Lt. leg before wrapping on Mon,Wed,Fri.   warfarin 4 MG tablet Commonly known as:  COUMADIN Give 4 mg  once an evening       Review of Systems   Limited secondary to dementia currently he is not complaining of any discomfort but nursing staff states he has some increased confusion as noted above    Skin-no increased bruising or bleeding has been noted  Head ears eyes nose mouth and throat-does not complain of visual changes or sore throat  Respiratory Denies chest pain shortness of breath but appears to possibly have some increased chest congestion  Cardiac does not complain of chest pain does have chronic lymphedema in legs are currently wrapped.  He is not complaining of any nausea vomiting diarrhea constipation or abdominal discomfort.  GU has a chronic indwelling Foley catheter does not complain of  dysuria.  Musculoskeletal is not complaining of joint pain currently does have significant lower extremity weakness with lymphedema.  Neurologic is not complaining of dizziness or headache  Psych he is cooperative with exam but does appear to have some increased confusion a bit agitated with exam wondering what we are up to He is oriented to self        Immunization History  Administered Date(s) Administered  . Influenza,inj,Quad PF,36+ Mos 03/20/2013, 03/23/2014, 02/11/2015  . Influenza-Unspecified 03/05/2016  . PPD Test 12/26/2014, 01/09/2015  . Pneumococcal Polysaccharide-23 03/02/2007, 02/11/2015  . Pneumococcal-Unspecified 03/09/2016  . Td 12/30/1996   Pertinent  Health Maintenance Due  Topic Date Due  . PNA vac Low Risk Adult (2 of 2 - PCV13) 03/09/2017  . INFLUENZA VACCINE  Completed   Fall Risk  01/04/2015 04/18/2014 08/15/2013  Falls in the past year? Yes Yes Yes  Number falls in past yr: 2 or more 1 2 or more  Injury with Fall? No Yes -  Risk Factor Category  - High Fall Risk High Fall Risk  Risk for fall due to : Impaired balance/gait;Impaired mobility Impaired mobility;Mental status change History of fall(s);Impaired balance/gait;Impaired mobility;Mental status change   Functional Status Survey:    Vitals:   03/25/16 1143  BP: 139/63  Pulse: 84  Resp: 20  Temp: 98.8 F (37.1 C)  TempSrc: Oral   There is no height or weight on file to calculate BMI. Physical Exam  In general this is a somewhat obese elderly male in no distress but appears a bit anxious sitting in his wheelchair.  His skin is warm and dry he does have some bruising most apparent arms bilaterally This appears to be chronic      He has venous stasis changes his left lower leg right lower leg both legs are currently wrapped   Eyes sclera and conjunctiva clear visual acuity appears grossly intact.  Oropharynx is clear mucous membranes moist.  Chest no sign of distress or  labored breathing but does have some diffuse crackles bilaterally  Heart is regular rate and rhythm with an occasional irregular beat he has again venous stasis changes lower extremities bilaterally.-Has a 2/6 systolic murmur  Abdomen is obese soft nontender with positive bowel sounds  GU he does have a Foley catheter draining   amber colored urine.  Musculoskeletal is able to move all extremities 4 grip strength appears intact bilaterally With chronic lower extremity weakness with lymphedema-has limited range of motion at shoulders bilaterally -- is chronic.  Neurologic is grossly intact to speech is clear no lateralizing findings.  Psyche is oriented to self is slightly agitated with exam and confused about what we are up to  Labs reviewed:  Recent Labs  01/15/16 0710 02/14/16 0710 02/17/16 1909  NA 142 143 139  K 4.7 3.6 3.8  CL 110 111 105  CO2 29 26 26   GLUCOSE 103* 100* 100*  BUN 29* 31* 29*  CREATININE 1.30* 1.11 1.04  CALCIUM 8.7* 8.7* 8.8*    Recent Labs  11/14/15 2312 02/14/16 0710 02/17/16 2351  AST 17 17 25   ALT 14* 15* 21  ALKPHOS 59 59 54  BILITOT 0.6 0.5 0.3  PROT 6.5 6.6 7.0  ALBUMIN 3.1* 2.7* 2.8*    Recent Labs  01/15/16 0710 02/14/16 0710 02/17/16 1909  WBC 7.6 10.4 8.4  NEUTROABS 3.1 6.7 4.1  HGB 10.6* 9.9* 10.5*  HCT 33.2* 31.6* 34.3*  MCV 86.9 87.1 87.1  PLT 252 325 353   Lab Results  Component Value Date   TSH 1.960 09/22/2013   Lab Results  Component Value Date   HGBA1C 6.3 (H) 10/24/2015   Lab Results  Component Value Date   CHOL 157 10/06/2012   HDL 34 (L) 10/06/2012   LDLCALC 96 10/06/2012   TRIG 135 10/06/2012   CHOLHDL 4.6 10/06/2012    Significant Diagnostic Results in last 30 days:  US Arterial Seg Multiple  Result Date: 02/25/2016 CLINICAL DATA:  Chronic bilateral lower extremity edema from venous stasis. Chronic left lower extremity venous ulcer. EXAM: NONINVASIVE PHYSIOLOGIC VASCULAR STUDY OF  BILATERAL LOWER EXTREMITIES TECHNIQUE: Evaluation of both lower extremities was performed at rest, including calculation of ankle-brachial indices, multiple segmental pressure evaluation, segmental Doppler and segmental pulse volume recording. COMPARISON:  None. FINDINGS: Right ABI:  1.07 Left ABI:  1.15 Right Lower Extremity: Biphasic and triphasic Doppler waveforms in the right lower extremity. The PVR waveforms are within normal limits. Left Lower Extremity: Some of the Doppler waveforms are irregular. Triphasic Doppler waveforms in the left dorsalis pedis artery. PVR waveforms in the left calf and ankle are similar to the right ankle. Decreased PVR waveform amplitudes in the left thigh compared to the right side. IMPRESSION: Normal ankle-brachial indices. No significant peripheral arterial disease in lower extremities. Electronically Signed   By: Richarda Overlie M.D.   On: 02/25/2016 15:08    Assessment/Plan  #1 increased confusion and chest congestion-will order a chest x-ray 2 view-he does have nebulizers scheduled 3 times a day and 4 times when necessary.  Also will order stat CBC with differential comprehensive metabolic panel and ammonia level.  We will await those results.  Monitor vital signs pulse ox every shift for now as well.  Addendum-  We have obtained the updated labs which appear reassuring ammonia level was within normal limits at 19  Metabolic panel showed normal sodium of 143 potassium was slightly low at 3.3U when was 28 creatinine was 1.02.  White count was within normal range at 8.6 hemoglobin was stable at 11.2  Will await chest x-ray results and continue to monitor Will supplement potassium with 20 mEq for 5 days he is not on a diuretic we will update a lab first laboratory day next week--also  update a BMP in approximately 2 weeks to ensure stability-also will check a magnesium level  Per repeat examination for left patient appear to be stable confusion did not appear to  be significantly progressing vital signs remained stable will await chest x-ray results  [id number]-of note greater than 35 minutes spent assessing patient-discussing his status with nursing staff-rechecking patient to ensure stability-reviewing chart-reviewing labs-and coordinating a plan of care       .      London Sheer, [place] [phone]

## 2016-03-26 ENCOUNTER — Non-Acute Institutional Stay (SKILLED_NURSING_FACILITY): Payer: Medicare Other | Admitting: Internal Medicine

## 2016-03-26 ENCOUNTER — Encounter: Payer: Self-pay | Admitting: Internal Medicine

## 2016-03-26 DIAGNOSIS — I714 Abdominal aortic aneurysm, without rupture: Secondary | ICD-10-CM | POA: Diagnosis not present

## 2016-03-26 DIAGNOSIS — J189 Pneumonia, unspecified organism: Secondary | ICD-10-CM

## 2016-03-26 DIAGNOSIS — L97322 Non-pressure chronic ulcer of left ankle with fat layer exposed: Secondary | ICD-10-CM | POA: Diagnosis not present

## 2016-03-26 NOTE — Progress Notes (Signed)
Location:   [facility] Room Number: 121/W Place of Service:  SNF (31) Provider:  Edmon CrapeArlo Lassen  No primary care provider on file.  Patient Care Team: Bjorn PippinJohn Wrenn, MD as Attending Physician (Urology) Mahlon GammonAnjali L Gupta, MD as Consulting Physician (Geriatric Medicine)  Extended Emergency Contact Information Primary Emergency Contact: [name],[name] Address: [address]          [place], [place] [address] [place] Home Phone: [phone] Mobile Phone: [phone] Relation: Daughter Secondary Emergency Contact: Lobdell,[name] Address: [address]          [place], [place] [address] [place] Home Phone: [phone] Mobile Phone: [phone] Relation: Son  Code Status:  DNR Goals of care: Advanced Directive information Advanced Directives 03/26/2016  Does patient have an advance directive? Yes  Type of Advance Directive Out of facility DNR (pink MOST or yellow form)  Does patient want to make changes to advanced directive? No - Patient declined  Copy of advanced directive(s) in chart? Yes  Would patient like information on creating an advanced directive? -  Pre-existing out of facility DNR order (yellow form or pink MOST form) -     Chief Complaint  Patient presents with  . Acute Visit    F/U pneumonia    HPI:  Pt is a 82 y.o. male seen today for an acute visit for Follow-up of chest x-ray which shows bilateral lower lobe pneumonia.  Patient was seen yesterday for some increased confusion-he was noted have chest congestion-he does have a routine duo nebs as well as when necessary duo nebs-  Chest x-ray did come back overnight showing bilateral lower lobe pneumonia-on call provider has started him on Augmentin for a 7 week course.  Vital signs continued to be stable at note at times he is somewhat tachycardic is not complaining of any chest pain increased shortness of breath however.  His confusion appears to be a bit improved  today.       Past Medical History:  Diagnosis Date  . AAA (abdominal aortic aneurysm) (HCC)   . AAA (abdominal aortic aneurysm) without rupture (HCC) 10/06/2012  . Anticoagulation goal of INR 2 to 3 06/01/2013  . Arthritis    "probably on the right leg/hip" (09/20/2013)  . Atrial fibrillation (HCC)   . B12 deficiency 09/20/2013  . Benign localized hyperplasia of prostate with urinary retention 12/23/2014  . Bladder calculus 12/23/2014   2.6cm and present since at least 2011 when it was about 1.6cm.   [name]. BPH (benign prostatic hyperplasia)   . Bronchospasm   . Cardiomyopathy (HCC) 10/15/2014  . Cellulitis   . CHF (congestive heart failure) (HCC) 09/26/2013  . Chronic venous insufficiency 10/06/2012  . Cognitive impairment   . COPD (chronic obstructive pulmonary disease) (HCC)   . Daily headache   . Dementia    "don't know exactly what kind" (09/20/2013)  . DVT (deep venous thrombosis) (HCC) 05/04/2013   "LLE"  . E. coli UTI 11/17/15   Cipro X 7 days  . Edema-Bilat Leg and foot 03/07/2013  . Elevated PSA   . Fall at home September 18, 2013  . Glaucoma, both eyes   . Kidney stones   . Multifactorial dementia 10/06/2012  . Nicotine dependence   . Obesity, unspecified 10/06/2012  . Peripheral vascular disease (HCC) 09/20/2012  . Pyelonephritis, acute 12/22/2014  . Ulcer (HCC)   . Venous insufficiency   . Weakness 09/18/2013  . Wound cellulitis    let ankle wound  size of half dollar per nurse at [facility] on 03/25/2015   Past Surgical History:  Procedure Laterality Date  . BACK SURGERY    . CATARACT EXTRACTION W/ INTRAOCULAR LENS  IMPLANT, BILATERAL Bilateral   . CHOLECYSTECTOMY  2011   Gall Bladder  . CYSTOSCOPY W/ STONE MANIPULATION  1970's   "once"  . CYSTOSCOPY WITH LITHOLAPAXY N/A 03/28/2015   Procedure: CYSTOSCOPY WITH LITHOLAPAXY;  Surgeon: Malen Gauze, MD;  Location: [facility];  Service: Urology;  Laterality: N/A;  . EYE SURGERY    . FEMUR FRACTURE SURGERY Right  04/1985  . HIP FRACTURE SURGERY Right 2009  . INSITU PERCUTANEOUS PINNING FEMUR Right 1986  . POSTERIOR LAMINECTOMY / DECOMPRESSION LUMBAR SPINE  2004   Decompressive laminectomy unilaterally on the right at L4-5 with  . SPINE SURGERY    . TRANSURETHRAL RESECTION OF PROSTATE N/A 03/28/2015   Procedure: TRANSURETHRAL RESECTION OF THE PROSTATE ;  Surgeon: Malen Gauze, MD;  Location: [facility];  Service: Urology;  Laterality: N/A;    Allergies  Allergen Reactions  . Oxycodone Hcl Other (See Comments)     makes pt "wild"  . Propoxyphene N-Acetaminophen Other (See Comments)     Makes pt. "wild"      Medication List       Accurate as of 03/26/16  4:20 PM. Always use your most recent med list.          acetaminophen 500 MG tablet Commonly known as:  TYLENOL Take 1,000 mg by mouth every 6 (six) hours as needed for mild pain or fever.   amoxicillin-clavulanate 875-125 MG tablet Commonly known as:  AUGMENTIN Take 1 tablet by mouth 2 (two) times daily.   clotrimazole-betamethasone cream Commonly known as:  LOTRISONE Apply to groin/scrotum rash till resolved every shift   diltiazem 120 MG 24 hr capsule Commonly known as:  CARDIZEM CD Take 120 mg by mouth every morning.   divalproex 250 MG DR tablet Commonly known as:  DEPAKOTE Give 250 mg by mouth once a day. Give 500 mg by mouth at bedtime   doxazosin 8 MG tablet Commonly known as:  CARDURA Take 8 mg by mouth every morning.   feeding supplement (PRO-STAT SUGAR FREE 64) Liqd Take 30 mLs by mouth 2 (two) times daily between meals.   Fluticasone-Salmeterol 250-50 MCG/DOSE Aepb Commonly known as:  ADVAIR Inhale 1 puff into the lungs 2 (two) times daily.   ipratropium-albuterol 0.5-2.5 (3) MG/3ML Soln Commonly known as:  DUONEB 3ml via Neb inhalation three times a day for congestion scheduled and 4 times daily as needed for SOB or congestion   latanoprost 0.005 % ophthalmic solution Commonly known as:   XALATAN Place 1 drop into both eyes at bedtime.   methenamine 1 g tablet Commonly known as:  HIPREX Take 1 g by mouth 2 (two) times daily with a meal.   metoprolol tartrate 25 MG tablet Commonly known as:  LOPRESSOR Take 0.5 tablets (12.5 mg total) by mouth 2 (two) times daily.   MILK OF MAGNESIA PO Give 30 ml by mouth daily as needed for constipation may use fleets enema   potassium chloride SA 20 MEQ tablet Commonly known as:  K-DUR,KLOR-CON Take 20 mEq by mouth daily.   SANTYL ointment Generic drug:  collagenase Apply 1 application topically daily.   sertraline 25 MG tablet Commonly known as:  ZOLOFT Take 75 mg by mouth daily. Take three tablets by mouth once daily   triamcinolone cream 0.1 %  Commonly known as:  KENALOG Apply 1 application topically every Monday, Wednesday, and Friday. Apply triamcinolone 0.1 % and 1/2 cetaphil cream  to both Rt. and Lt. leg before wrapping on Mon,Wed,Fri.   warfarin 4 MG tablet Commonly known as:  COUMADIN Give 4 mg  once an evening       Review of Systems    r In general does not complaining of fever chills  Skin-no increased bruising or bleeding has been noted-his skin is warm and dry  Head ears eyes nose mouth and throat-does not complain of visual changes or sore throat  Respiratory Denies shortness of breath again appears to have pneumonia-does have history COPD  Cardiac does not complain of chest pain does have chronic lymphedema in legs are currently wrapped.  He is not complaining of any nausea vomiting diarrhea constipation or abdominal discomfort.  GU has a chronic indwelling Foley catheter does not complain of dysuria.  Musculoskeletal is not complaining of joint pain currently does have significant lower extremity weakness with lymphedema.  Neurologic is not complaining of dizziness or headache  Psych he is cooperative with exam but does appear confusion apparently somewhat improve          Immunization History  Administered Date(s) Administered  . Influenza,inj,Quad PF,36+ Mos 03/20/2013, 03/23/2014, 02/11/2015  . Influenza-Unspecified 03/05/2016  . PPD Test 12/26/2014, 01/09/2015  . Pneumococcal Polysaccharide-23 03/02/2007, 02/11/2015  . Pneumococcal-Unspecified 03/09/2016  . Td 12/30/1996   Pertinent  Health Maintenance Due  Topic Date Due  . PNA vac Low Risk Adult (2 of 2 - PCV13) 03/09/2017  . INFLUENZA VACCINE  Completed   Fall Risk  01/04/2015 04/18/2014 08/15/2013  Falls in the past year? Yes Yes Yes  Number falls in past yr: 2 or more 1 2 or more  Injury with Fall? No Yes -  Risk Factor Category  - High Fall Risk High Fall Risk  Risk for fall due to : Impaired balance/gait;Impaired mobility Impaired mobility;Mental status change History of fall(s);Impaired balance/gait;Impaired mobility;Mental status change   Functional Status Survey:    Vitals:   03/26/16 1608  BP: (!) 116/57  Pulse: (!) 111  Resp: 20  Temp: 98.6 F (37 C)  TempSrc: Oral  Of note Apical pulse taken manually was in the 90s There is no height or weight on file to calculate BMI. Physical Exam  In general this is a somewhat obese elderly male in no distress   His skin is warm and dry he does have some bruising most apparent arms bilaterally This appears to be chronic there is no diaphoresis      He has venous stasis changes his left lower leg right lower leg both legs are currently wrapped  Eyes sclera and conjunctiva clear visual acuity appears grossly intact.  Oropharynx is clear mucous membranes moist.  Chest no sign of distress or labored breathing--has less congestion then noted yesterday  Heart is regular rate and rhythm with an occasional irregular beat he has again venous stasis changes lower extremities bilaterally.-Has a 2/6 systolic murmur-rate was 96 on exam  Abdomen is obese soft nontender with positive bowel sounds   GU he does have a  Foley catheter draining amber colored urine.  Musculoskeletal is able to move all extremities 4 grip strength appears intact bilaterally With chronic lower extremity weakness with lymphedema-has limited range of motion at shoulders bilaterally --is chronic.  Neurologic is grossly intact to speech is clear no lateralizing findings. Has intermittent upper extremity tremor  Psyche is  oriented to self -confusion appear somewhat improved had recognized nurse by name earlier in the day-was conversational asked me how I was doing   Labs reviewed:  Recent Labs  02/14/16 0710 02/17/16 1909 03/25/16 1240  NA 143 139 143  K 3.6 3.8 3.3*  CL 111 105 109  CO2 26 26 29   GLUCOSE 100* 100* 103*  BUN 31* 29* 28*  CREATININE 1.11 1.04 1.02  CALCIUM 8.7* 8.8* 8.9    Recent Labs  02/14/16 0710 02/17/16 2351 03/25/16 1240  AST 17 25 21   ALT 15* 21 13*  ALKPHOS 59 54 54  BILITOT 0.5 0.3 0.6  PROT 6.6 7.0 7.0  ALBUMIN 2.7* 2.8* 3.1*    Recent Labs  02/14/16 0710 02/17/16 1909 03/25/16 1240  WBC 10.4 8.4 8.6  NEUTROABS 6.7 4.1 5.8  HGB 9.9* 10.5* 11.2*  HCT 31.6* 34.3* 36.1*  MCV 87.1 87.1 88.9  PLT 325 353 279   Lab Results  Component Value Date   TSH 1.960 09/22/2013   Lab Results  Component Value Date   HGBA1C 6.3 (H) 10/24/2015   Lab Results  Component Value Date   CHOL 157 10/06/2012   HDL 34 (L) 10/06/2012   LDLCALC 96 10/06/2012   TRIG 135 10/06/2012   CHOLHDL 4.6 10/06/2012    Significant Diagnostic Results in last 30 days:  No results found.  Assessment/Plan  #1 history of pneumonia-as noted above chest x-ray was positive for bilateral lower lobe pneumonia-he has been started on a seven-day course of Augmentin -.  I do note he has intermittent tachycardia-he is on duo nebs will switch this to Atrovent nebulizers routinely 3 times a day and 4 times a day when necessary-clinically he appears to be stable does not complain of any syncope or chest pain  I suspect the nebulizers may be contributing to the tachycardia and hopefully the change to Atrovent will help.  I do note he is on Coumadin with a history of-r afib-INR has been ordered for tomorrow--INR on November 23 was 2.19  [id number]      London Sheer, [place] [phone]

## 2016-03-27 ENCOUNTER — Encounter (HOSPITAL_COMMUNITY)
Admission: RE | Admit: 2016-03-27 | Discharge: 2016-03-27 | Disposition: A | Discharge status: Home / Self Care | Payer: Medicare Other | Source: Skilled Nursing Facility | Attending: *Deleted | Admitting: *Deleted

## 2016-03-27 DIAGNOSIS — R293 Abnormal posture: Secondary | ICD-10-CM | POA: Diagnosis not present

## 2016-03-27 DIAGNOSIS — Z9181 History of falling: Secondary | ICD-10-CM | POA: Diagnosis not present

## 2016-03-27 DIAGNOSIS — I5032 Chronic diastolic (congestive) heart failure: Secondary | ICD-10-CM | POA: Diagnosis not present

## 2016-03-27 DIAGNOSIS — J449 Chronic obstructive pulmonary disease, unspecified: Secondary | ICD-10-CM | POA: Diagnosis not present

## 2016-03-27 DIAGNOSIS — Z7901 Long term (current) use of anticoagulants: Secondary | ICD-10-CM | POA: Diagnosis not present

## 2016-03-27 LAB — PROTIME-INR
INR: 1.99
Prothrombin Time: 22.9 seconds — ABNORMAL HIGH (ref 11.4–15.2)

## 2016-03-30 ENCOUNTER — Non-Acute Institutional Stay (SKILLED_NURSING_FACILITY): Payer: Medicare Other | Admitting: Internal Medicine

## 2016-03-30 ENCOUNTER — Emergency Department (HOSPITAL_COMMUNITY): Payer: Medicare Other

## 2016-03-30 ENCOUNTER — Observation Stay (HOSPITAL_COMMUNITY)
Admission: EM | Admit: 2016-03-30 | Discharge: 2016-04-01 | Disposition: A | Discharge status: Skilled Nursing Facility | Payer: Medicare Other | Attending: Internal Medicine | Admitting: Internal Medicine

## 2016-03-30 ENCOUNTER — Other Ambulatory Visit: Payer: Self-pay

## 2016-03-30 ENCOUNTER — Encounter: Payer: Self-pay | Admitting: Internal Medicine

## 2016-03-30 ENCOUNTER — Encounter (HOSPITAL_COMMUNITY)
Admission: RE | Admit: 2016-03-30 | Discharge: 2016-03-30 | Disposition: A | Discharge status: Home / Self Care | Payer: Medicare Other | Source: Skilled Nursing Facility | Attending: *Deleted | Admitting: *Deleted

## 2016-03-30 ENCOUNTER — Encounter (HOSPITAL_COMMUNITY): Payer: Self-pay

## 2016-03-30 DIAGNOSIS — N139 Obstructive and reflux uropathy, unspecified: Secondary | ICD-10-CM | POA: Diagnosis not present

## 2016-03-30 DIAGNOSIS — J449 Chronic obstructive pulmonary disease, unspecified: Secondary | ICD-10-CM | POA: Diagnosis not present

## 2016-03-30 DIAGNOSIS — I82509 Chronic embolism and thrombosis of unspecified deep veins of unspecified lower extremity: Secondary | ICD-10-CM | POA: Diagnosis not present

## 2016-03-30 DIAGNOSIS — R7303 Prediabetes: Secondary | ICD-10-CM | POA: Diagnosis present

## 2016-03-30 DIAGNOSIS — Z66 Do not resuscitate: Secondary | ICD-10-CM | POA: Insufficient documentation

## 2016-03-30 DIAGNOSIS — R51 Headache: Secondary | ICD-10-CM | POA: Insufficient documentation

## 2016-03-30 DIAGNOSIS — E669 Obesity, unspecified: Secondary | ICD-10-CM | POA: Insufficient documentation

## 2016-03-30 DIAGNOSIS — J189 Pneumonia, unspecified organism: Principal | ICD-10-CM | POA: Diagnosis present

## 2016-03-30 DIAGNOSIS — Z22322 Carrier or suspected carrier of Methicillin resistant Staphylococcus aureus: Secondary | ICD-10-CM

## 2016-03-30 DIAGNOSIS — Z86718 Personal history of other venous thrombosis and embolism: Secondary | ICD-10-CM | POA: Diagnosis not present

## 2016-03-30 DIAGNOSIS — R778 Other specified abnormalities of plasma proteins: Secondary | ICD-10-CM | POA: Diagnosis present

## 2016-03-30 DIAGNOSIS — R4182 Altered mental status, unspecified: Secondary | ICD-10-CM

## 2016-03-30 DIAGNOSIS — I714 Abdominal aortic aneurysm, without rupture: Secondary | ICD-10-CM | POA: Diagnosis not present

## 2016-03-30 DIAGNOSIS — R7989 Other specified abnormal findings of blood chemistry: Secondary | ICD-10-CM | POA: Diagnosis present

## 2016-03-30 DIAGNOSIS — Z6835 Body mass index (BMI) 35.0-35.9, adult: Secondary | ICD-10-CM | POA: Insufficient documentation

## 2016-03-30 DIAGNOSIS — N400 Enlarged prostate without lower urinary tract symptoms: Secondary | ICD-10-CM | POA: Diagnosis not present

## 2016-03-30 DIAGNOSIS — I5032 Chronic diastolic (congestive) heart failure: Secondary | ICD-10-CM | POA: Diagnosis present

## 2016-03-30 DIAGNOSIS — Y95 Nosocomial condition: Secondary | ICD-10-CM | POA: Insufficient documentation

## 2016-03-30 DIAGNOSIS — I480 Paroxysmal atrial fibrillation: Secondary | ICD-10-CM | POA: Diagnosis not present

## 2016-03-30 DIAGNOSIS — Z87891 Personal history of nicotine dependence: Secondary | ICD-10-CM | POA: Insufficient documentation

## 2016-03-30 DIAGNOSIS — I739 Peripheral vascular disease, unspecified: Secondary | ICD-10-CM | POA: Diagnosis not present

## 2016-03-30 DIAGNOSIS — Z7901 Long term (current) use of anticoagulants: Secondary | ICD-10-CM | POA: Insufficient documentation

## 2016-03-30 DIAGNOSIS — Z9981 Dependence on supplemental oxygen: Secondary | ICD-10-CM | POA: Diagnosis not present

## 2016-03-30 DIAGNOSIS — F0390 Unspecified dementia without behavioral disturbance: Secondary | ICD-10-CM | POA: Insufficient documentation

## 2016-03-30 LAB — BASIC METABOLIC PANEL
ANION GAP: 6 (ref 5–15)
BUN: 24 mg/dL — ABNORMAL HIGH (ref 6–20)
CALCIUM: 8.7 mg/dL — AB (ref 8.9–10.3)
CO2: 28 mmol/L (ref 22–32)
Chloride: 108 mmol/L (ref 101–111)
Creatinine, Ser: 0.94 mg/dL (ref 0.61–1.24)
GFR calc Af Amer: >60 mL/min (ref >60)
GFR calc non Af Amer: >60 mL/min (ref >60)
Glucose, Bld: 104 mg/dL — ABNORMAL HIGH (ref 65–99)
POTASSIUM: 4 mmol/L (ref 3.5–5.1)
Sodium: 142 mmol/L (ref 135–145)

## 2016-03-30 LAB — TROPONIN I
Troponin I: 0.04 ng/mL (ref <0.03)
Troponin I: 0.04 ng/mL (ref <0.03)

## 2016-03-30 LAB — COMPREHENSIVE METABOLIC PANEL
ALBUMIN: 3.1 g/dL — AB (ref 3.5–5.0)
ALK PHOS: 55 U/L (ref 38–126)
ALT: 16 U/L — ABNORMAL LOW (ref 17–63)
AST: 19 U/L (ref 15–41)
Anion gap: 7 (ref 5–15)
BILIRUBIN TOTAL: 0.7 mg/dL (ref 0.3–1.2)
BUN: 24 mg/dL — AB (ref 6–20)
CALCIUM: 8.7 mg/dL — AB (ref 8.9–10.3)
CO2: 24 mmol/L (ref 22–32)
Chloride: 108 mmol/L (ref 101–111)
Creatinine, Ser: 0.97 mg/dL (ref 0.61–1.24)
GFR calc Af Amer: >60 mL/min (ref >60)
GFR calc non Af Amer: >60 mL/min (ref >60)
GLUCOSE: 123 mg/dL — AB (ref 65–99)
Potassium: 4.2 mmol/L (ref 3.5–5.1)
Sodium: 139 mmol/L (ref 135–145)
TOTAL PROTEIN: 7.1 g/dL (ref 6.5–8.1)

## 2016-03-30 LAB — CBC WITH DIFFERENTIAL/PLATELET
BASOS ABS: 0 10*3/uL (ref 0.0–0.1)
BASOS PCT: 0 %
EOS ABS: 0.3 10*3/uL (ref 0.0–0.7)
EOS PCT: 3 %
HCT: 36.8 % — ABNORMAL LOW (ref 39.0–52.0)
HEMOGLOBIN: 11.4 g/dL — AB (ref 13.0–17.0)
LYMPHS ABS: 1.3 10*3/uL (ref 0.7–4.0)
Lymphocytes Relative: 14 %
MCH: 27.7 pg (ref 26.0–34.0)
MCHC: 31 g/dL (ref 30.0–36.0)
MCV: 89.3 fL (ref 78.0–100.0)
Monocytes Absolute: 1.7 10*3/uL — ABNORMAL HIGH (ref 0.1–1.0)
Monocytes Relative: 18 %
NEUTROS PCT: 65 %
Neutro Abs: 6.4 10*3/uL (ref 1.7–7.7)
PLATELETS: 304 10*3/uL (ref 150–400)
RBC: 4.12 MIL/uL — AB (ref 4.22–5.81)
RDW: 17.8 % — ABNORMAL HIGH (ref 11.5–15.5)
WBC: 9.8 10*3/uL (ref 4.0–10.5)

## 2016-03-30 LAB — PROTIME-INR
INR: 2.37
INR: 2.45
PROTHROMBIN TIME: 26.3 s — AB (ref 11.4–15.2)
PROTHROMBIN TIME: 27 s — AB (ref 11.4–15.2)

## 2016-03-30 LAB — MRSA PCR SCREENING: MRSA by PCR: POSITIVE — AB

## 2016-03-30 LAB — MAGNESIUM
Magnesium: 2.1 mg/dL (ref 1.7–2.4)
Magnesium: 2 mg/dL (ref 1.7–2.4)

## 2016-03-30 LAB — LACTIC ACID, PLASMA: LACTIC ACID, VENOUS: 1.7 mmol/L (ref 0.5–1.9)

## 2016-03-30 LAB — PHOSPHORUS: PHOSPHORUS: 2.8 mg/dL (ref 2.5–4.6)

## 2016-03-30 LAB — VALPROIC ACID LEVEL: VALPROIC ACID LVL: 39 ug/mL — AB (ref 50.0–100.0)

## 2016-03-30 MED ORDER — DEXTROSE 5 % IV SOLN: 1.0000 g | Freq: Once | INTRAVENOUS | Status: DC

## 2016-03-30 MED ORDER — METHENAMINE HIPPURATE 1 G PO TABS
1.0000 g | ORAL_TABLET | Freq: Two times a day (BID) | ORAL | Status: DC
  Filled 2016-03-30 (×3): qty 1

## 2016-03-30 MED ORDER — SODIUM CHLORIDE 0.9 % IV BOLUS (SEPSIS): 500.0000 mL | Freq: Once | INTRAVENOUS | Status: DC

## 2016-03-30 MED ORDER — ACETAMINOPHEN 325 MG PO TABS: 650.0000 mg | ORAL_TABLET | Freq: Four times a day (QID) | ORAL | Status: DC | PRN

## 2016-03-30 MED ORDER — SODIUM CHLORIDE 0.9 % IV BOLUS (SEPSIS)
250.0000 mL | Freq: Once | INTRAVENOUS | Status: AC
  Administered 2016-03-30: 250 mL via INTRAVENOUS

## 2016-03-30 MED ORDER — DIVALPROEX SODIUM 250 MG PO DR TAB
250.0000 mg | DELAYED_RELEASE_TABLET | Freq: Every day | ORAL | Status: DC
  Administered 2016-03-31 – 2016-04-01 (×2): 250 mg via ORAL
  Filled 2016-03-30 (×2): qty 1

## 2016-03-30 MED ORDER — IPRATROPIUM BROMIDE 0.02 % IN SOLN: 0.5000 mg | Freq: Four times a day (QID) | RESPIRATORY_TRACT | Status: DC

## 2016-03-30 MED ORDER — DILTIAZEM HCL ER COATED BEADS 120 MG PO CP24
120.0000 mg | ORAL_CAPSULE | Freq: Every morning | ORAL | Status: DC
  Administered 2016-03-31 – 2016-04-01 (×2): 120 mg via ORAL
  Filled 2016-03-30 (×2): qty 1

## 2016-03-30 MED ORDER — SODIUM CHLORIDE 0.9% FLUSH
3.0000 mL | Freq: Two times a day (BID) | INTRAVENOUS | Status: DC
  Administered 2016-03-30 – 2016-04-01 (×3): 3 mL via INTRAVENOUS

## 2016-03-30 MED ORDER — METOPROLOL TARTRATE 25 MG PO TABS
12.5000 mg | ORAL_TABLET | Freq: Two times a day (BID) | ORAL | Status: DC
  Administered 2016-03-30 – 2016-04-01 (×3): 12.5 mg via ORAL
  Filled 2016-03-30 (×4): qty 1

## 2016-03-30 MED ORDER — DIVALPROEX SODIUM 250 MG PO DR TAB
500.0000 mg | DELAYED_RELEASE_TABLET | Freq: Every day | ORAL | Status: DC
  Administered 2016-03-30: 500 mg via ORAL
  Filled 2016-03-30 (×2): qty 2

## 2016-03-30 MED ORDER — MUPIROCIN 2 % EX OINT
1.0000 "application " | TOPICAL_OINTMENT | Freq: Two times a day (BID) | CUTANEOUS | Status: DC
  Administered 2016-03-30 – 2016-04-01 (×3): 1 via NASAL
  Filled 2016-03-30: qty 22

## 2016-03-30 MED ORDER — DIVALPROEX SODIUM 250 MG PO DR TAB: 250.0000 mg | DELAYED_RELEASE_TABLET | Freq: Every day | ORAL | Status: DC

## 2016-03-30 MED ORDER — LATANOPROST 0.005 % OP SOLN
1.0000 [drp] | Freq: Every day | OPHTHALMIC | Status: DC
  Administered 2016-03-30: 1 [drp] via OPHTHALMIC
  Filled 2016-03-30: qty 2.5

## 2016-03-30 MED ORDER — MAGNESIUM HYDROXIDE 400 MG/5ML PO SUSP: 30.0000 mL | Freq: Every day | ORAL | Status: DC | PRN

## 2016-03-30 MED ORDER — ONDANSETRON HCL 4 MG PO TABS: 4.0000 mg | ORAL_TABLET | Freq: Four times a day (QID) | ORAL | Status: DC | PRN

## 2016-03-30 MED ORDER — DEXTROSE 5 % IV SOLN
INTRAVENOUS | Status: AC
  Filled 2016-03-30 (×2): qty 1

## 2016-03-30 MED ORDER — LATANOPROST 0.005 % OP SOLN
OPHTHALMIC | Status: AC
  Filled 2016-03-30: qty 2.5

## 2016-03-30 MED ORDER — WARFARIN SODIUM 2 MG PO TABS
4.0000 mg | ORAL_TABLET | Freq: Once | ORAL | Status: AC
  Administered 2016-03-30: 4 mg via ORAL
  Filled 2016-03-30: qty 2

## 2016-03-30 MED ORDER — SODIUM CHLORIDE 0.9 % IV SOLN
INTRAVENOUS | Status: DC
  Administered 2016-03-30: via INTRAVENOUS
  Administered 2016-03-30: 100 mL/h via INTRAVENOUS

## 2016-03-30 MED ORDER — SERTRALINE HCL 50 MG PO TABS
75.0000 mg | ORAL_TABLET | Freq: Every day | ORAL | Status: DC
  Administered 2016-03-31 – 2016-04-01 (×2): 75 mg via ORAL
  Filled 2016-03-30 (×2): qty 2

## 2016-03-30 MED ORDER — ACETAMINOPHEN 650 MG RE SUPP: 650.0000 mg | Freq: Four times a day (QID) | RECTAL | Status: DC | PRN

## 2016-03-30 MED ORDER — WARFARIN - PHARMACIST DOSING INPATIENT
Freq: Every day | Status: DC
Start: 2016-03-31 — End: 2016-04-01
  Administered 2016-03-31: 1

## 2016-03-30 MED ORDER — ALBUTEROL SULFATE (2.5 MG/3ML) 0.083% IN NEBU: 2.5000 mg | INHALATION_SOLUTION | RESPIRATORY_TRACT | Status: DC | PRN

## 2016-03-30 MED ORDER — CHLORHEXIDINE GLUCONATE CLOTH 2 % EX PADS
6.0000 | MEDICATED_PAD | Freq: Every day | CUTANEOUS | Status: DC
  Administered 2016-04-01: 6 via TOPICAL

## 2016-03-30 MED ORDER — ALBUTEROL SULFATE (2.5 MG/3ML) 0.083% IN NEBU: 2.5000 mg | INHALATION_SOLUTION | Freq: Four times a day (QID) | RESPIRATORY_TRACT | Status: DC

## 2016-03-30 MED ORDER — CEFEPIME HCL 1 G IJ SOLR
1.0000 g | Freq: Three times a day (TID) | INTRAMUSCULAR | Status: DC
  Administered 2016-03-30 – 2016-04-01 (×5): 1 g via INTRAVENOUS
  Filled 2016-03-30 (×9): qty 1

## 2016-03-30 MED ORDER — VANCOMYCIN HCL 10 G IV SOLR
2000.0000 mg | Freq: Once | INTRAVENOUS | Status: AC
  Administered 2016-03-30: 2000 mg via INTRAVENOUS
  Filled 2016-03-30: qty 2000

## 2016-03-30 MED ORDER — SODIUM CHLORIDE 0.9 % IV BOLUS (SEPSIS)
250.0000 mL | Freq: Once | INTRAVENOUS | Status: AC
Start: 2016-03-30 — End: 2016-03-30
  Administered 2016-03-30: 250 mL via INTRAVENOUS

## 2016-03-30 MED ORDER — MOMETASONE FURO-FORMOTEROL FUM 200-5 MCG/ACT IN AERO
INHALATION_SPRAY | RESPIRATORY_TRACT | Status: AC
  Filled 2016-03-30: qty 8.8

## 2016-03-30 MED ORDER — MOMETASONE FURO-FORMOTEROL FUM 200-5 MCG/ACT IN AERO
2.0000 | INHALATION_SPRAY | Freq: Two times a day (BID) | RESPIRATORY_TRACT | Status: DC
  Administered 2016-03-31 – 2016-04-01 (×3): 2 via RESPIRATORY_TRACT
  Filled 2016-03-30: qty 8.8

## 2016-03-30 MED ORDER — VANCOMYCIN HCL IN DEXTROSE 1-5 GM/200ML-% IV SOLN
1000.0000 mg | Freq: Two times a day (BID) | INTRAVENOUS | Status: DC
  Administered 2016-03-31 – 2016-04-01 (×3): 1000 mg via INTRAVENOUS
  Filled 2016-03-30 (×2): qty 200

## 2016-03-30 MED ORDER — DOXAZOSIN MESYLATE 2 MG PO TABS
8.0000 mg | ORAL_TABLET | Freq: Every morning | ORAL | Status: DC
  Administered 2016-03-31 – 2016-04-01 (×2): 8 mg via ORAL
  Filled 2016-03-30 (×2): qty 4

## 2016-03-30 MED ORDER — COLLAGENASE 250 UNIT/GM EX OINT
1.0000 "application " | TOPICAL_OINTMENT | Freq: Every day | CUTANEOUS | Status: DC
  Administered 2016-04-01: 1 via TOPICAL
  Filled 2016-03-30: qty 30

## 2016-03-30 MED ORDER — IPRATROPIUM-ALBUTEROL 0.5-2.5 (3) MG/3ML IN SOLN
3.0000 mL | Freq: Four times a day (QID) | RESPIRATORY_TRACT | Status: DC
  Administered 2016-03-30 – 2016-04-01 (×7): 3 mL via RESPIRATORY_TRACT
  Filled 2016-03-30 (×8): qty 3

## 2016-03-30 MED ORDER — ONDANSETRON HCL 4 MG/2ML IJ SOLN: 4.0000 mg | Freq: Four times a day (QID) | INTRAMUSCULAR | Status: DC | PRN

## 2016-03-30 MED ORDER — POTASSIUM CHLORIDE CRYS ER 20 MEQ PO TBCR
20.0000 meq | EXTENDED_RELEASE_TABLET | Freq: Every day | ORAL | Status: DC
  Administered 2016-03-31 – 2016-04-01 (×2): 20 meq via ORAL
  Filled 2016-03-30 (×2): qty 1

## 2016-03-30 NOTE — ED Notes (Signed)
MD at the bedside, pt still have not new urine output. Total of 500cc bolus

## 2016-03-30 NOTE — Progress Notes (Signed)
Location:   [facility] Room Number: 121/W Place of Service:  SNF (31) Provider:  Edmon Crape  No primary care provider on file.  Patient Care Team: [name], MD as Attending Physician (Urology) [name], MD as Consulting Physician (Geriatric Medicine)  Extended Emergency Contact Information Primary Emergency Contact: [name],[name] Address: [address]          [place], [place] [address] [name] of [place] Home Phone: [phone] Mobile Phone: [phone] Relation: Daughter Secondary Emergency Contact: Sorber,[name] Address: [address]          [place], [place] [address] [name] of [place] Home Phone: [phone] Mobile Phone: [phone] Relation: Son  Code Status:  DNR Goals of care: Advanced Directive information Advanced Directives 03/30/2016  Does patient have an advance directive? Yes  Type of Advance Directive Out of facility DNR (pink MOST or yellow form)  Does patient want to make changes to advanced directive? No - Patient declined  Copy of advanced directive(s) in chart? Yes  Would patient like information on creating an advanced directive? -  Pre-existing out of facility DNR order (yellow form or pink MOST form) -     Chief Complaint  Patient presents with  . Acute Visit    Confused and can't stay awake not himself    HPI:  Pt is a 82 y.o. male seen today for an acute visit for Complaints of increased confusion and patient "just not being himself" Patient was seen multiple times last week for initially increased confusion and chest congestion-he was found to have a left lower lobe infiltrates and was started on Augmentin.  Lab work was unremarkable his white count was within normal range at 8.6 electrolytes were within normal ranges  potassium was slightly low at 3.3 however with supplementation this has come up to 4.0 today.  Renal function appears to be stable his ammonia level was within normal range liver function  tests worsen she actually within normal range as well.  He also has a history of atrial fibrillation DVT on chronic Coumadin INR is therapeutic today at 2.37.  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  Confusion appeaedr to be a bit improved late last week however today this appears to be increasing again he is in his room does not really recognize me today-he is alert has somewhat increased trembling.  He denies any pain or shortness of breath somewhat agitated with exam-this appears to be a change from what I saw last week Staff also feels that he can't stay awake.  And apparently has had some increased falls-although he tends to get out of his wheelchair which is not totally new occurrence  His vital signs are stable systolic is somewhat elevated at 166 today but occasionally this will occur.  He also occasionally has tachycardias pulse is 98 currently-I did change his nebulizers from duo nebs to Atrovent last week secondary to concerns of tachycardia and appears  pulses have been within normal range over the weekend     Past Medical History:  Diagnosis Date  . AAA (abdominal aortic aneurysm) (HCC)   . AAA (abdominal aortic aneurysm) without rupture (HCC) 10/06/2012  . Anticoagulation goal of INR 2 to 3  06/01/2013  . Arthritis    "probably on the right leg/hip" (09/20/2013)  . Atrial fibrillation (HCC)   . B12 deficiency 09/20/2013  . Benign localized hyperplasia of prostate with urinary retention 12/23/2014  . Bladder calculus 12/23/2014   2.6cm and present since at least 2011 when it was about 1.6cm.   [name]. BPH (benign prostatic hyperplasia)   . Bronchospasm   . Cardiomyopathy (HCC) 10/15/2014  . Cellulitis    . CHF (congestive heart failure) (HCC) 09/26/2013  . Chronic venous insufficiency 10/06/2012  . Cognitive impairment   . COPD (chronic obstructive pulmonary disease) (HCC)   . Daily headache   . Dementia    "don't know exactly what kind" (09/20/2013)  . DVT (deep venous thrombosis) (HCC) 05/04/2013   "LLE"  . E. coli UTI 11/17/15   Cipro X 7 days  . Edema-Bilat Leg and foot 03/07/2013  . Elevated PSA   . Fall at home September 18, 2013  . Glaucoma, both eyes   . Kidney stones   . Multifactorial dementia 10/06/2012  . Nicotine dependence   . Obesity, unspecified 10/06/2012  . Peripheral vascular disease (HCC) 09/20/2012  . Pyelonephritis, acute 12/22/2014  . Ulcer (HCC)   . Venous insufficiency   . Weakness 09/18/2013  . Wound cellulitis    let ankle wound size of half dollar per nurse [facility] on 03/25/2015   Past Surgical History:  Procedure Laterality Date  . BACK SURGERY    . CATARACT EXTRACTION W/ INTRAOCULAR LENS  IMPLANT, BILATERAL Bilateral   . CHOLECYSTECTOMY  2011   Gall Bladder  . CYSTOSCOPY W/ STONE MANIPULATION  1970's   "once"  . CYSTOSCOPY WITH LITHOLAPAXY N/A 03/28/2015   Procedure: CYSTOSCOPY WITH LITHOLAPAXY;  Surgeon: Malen GauzePatrick L McKenzie, MD;  Location: [facility];  Service: Urology;  Laterality: N/A;  . EYE SURGERY    . FEMUR FRACTURE SURGERY Right 04/1985  . HIP FRACTURE SURGERY Right 2009  . INSITU PERCUTANEOUS PINNING FEMUR Right 1986  . POSTERIOR LAMINECTOMY / DECOMPRESSION LUMBAR SPINE  2004   Decompressive laminectomy unilaterally on the right at L4-5 with  . SPINE SURGERY    . TRANSURETHRAL RESECTION OF PROSTATE N/A 03/28/2015   Procedure: TRANSURETHRAL RESECTION OF THE PROSTATE ;  Surgeon: Malen GauzePatrick L McKenzie, MD;  Location: [facility];  Service: Urology;  Laterality: N/A;    Allergies  Allergen Reactions  . Oxycodone Hcl Other (See Comments)     makes pt "wild"  . Propoxyphene N-Acetaminophen Other (See Comments)     Makes pt. "wild"        Medication List       Accurate as of 03/30/16 11:09 AM. Always use your most recent med list.          acetaminophen 500 MG tablet Commonly known as:  TYLENOL Take 1,000 mg by mouth every 6 (six) hours as needed for mild pain or fever.   amoxicillin-clavulanate 875-125 MG tablet Commonly known as:  AUGMENTIN Take 1 tablet by mouth 2 (two) times daily.   clotrimazole-betamethasone cream Commonly known as:  LOTRISONE Apply to groin/scrotum rash till resolved every shift   diltiazem 120 MG 24 hr capsule Commonly known as:  CARDIZEM CD Take 120 mg by mouth every morning.   divalproex 250 MG DR tablet Commonly known as:  DEPAKOTE Give 250 mg by mouth once a day. Give 500 mg by mouth at bedtime   doxazosin 8 MG tablet Commonly known as:  CARDURA Take 8 mg by mouth  every morning.   feeding supplement (PRO-STAT SUGAR FREE 64) Liqd Take 30 mLs by mouth 2 (two) times daily between meals.   Fluticasone-Salmeterol 250-50 MCG/DOSE Aepb Commonly known as:  ADVAIR Inhale 1 puff into the lungs 2 (two) times daily.   ipratropium 0.02 % nebulizer solution Commonly known as:  ATROVENT Give 1 vial inhalation Three times a day. Give 1 inhalation PRN SOB, Wheezing every 4 hours   latanoprost 0.005 % ophthalmic solution Commonly known as:  XALATAN Place 1 drop into both eyes at bedtime.   methenamine 1 g tablet Commonly known as:  HIPREX Take 1 g by mouth 2 (two) times daily with a meal.   metoprolol tartrate 25 MG tablet Commonly known as:  LOPRESSOR Take 0.5 tablets (12.5 mg total) by mouth 2 (two) times daily.   MILK OF MAGNESIA PO Give 30 ml by mouth daily as needed for constipation may use fleets enema   potassium chloride SA 20 MEQ tablet Commonly known as:  K-DUR,KLOR-CON Take 20 mEq by mouth daily.   SANTYL ointment Generic drug:  collagenase Apply 1 application topically daily.   sertraline 25 MG tablet Commonly known as:  ZOLOFT Take 75 mg by mouth daily. Take  three tablets by mouth once daily   triamcinolone cream 0.1 % Commonly known as:  KENALOG Apply 1 application topically every Monday, Wednesday, and Friday. Apply triamcinolone 0.1 % and 1/2 cetaphil cream  to both Rt. and Lt. leg before wrapping on Mon,Wed,Fri.   warfarin 4 MG tablet Commonly known as:  COUMADIN Give 4 mg  once an evening       Review of Systems   This is somewhat limited since patient cannot really give an accurate history-however when asked he is denying any pain or shortness of breath.  He is denying any abdominal discomfort nausea chest pain.  Appears to have had some increased agitation especially when I ask him questions  Immunization History  Administered Date(s) Administered  . Influenza,inj,Quad PF,36+ Mos 03/20/2013, 03/23/2014, 02/11/2015  . Influenza-Unspecified 03/05/2016  . PPD Test 12/26/2014, 01/09/2015  . Pneumococcal Polysaccharide-23 03/02/2007, 02/11/2015  . Pneumococcal-Unspecified 03/09/2016  . Td 12/30/1996   Pertinent  Health Maintenance Due  Topic Date Due  . PNA vac Low Risk Adult (2 of 2 - PCV13) 03/09/2017  . INFLUENZA VACCINE  Completed   Fall Risk  01/04/2015 04/18/2014 08/15/2013  Falls in the past year? Yes Yes Yes  Number falls in past yr: 2 or more 1 2 or more  Injury with Fall? No Yes -  Risk Factor Category  - High Fall Risk High Fall Risk  Risk for fall due to : Impaired balance/gait;Impaired mobility Impaired mobility;Mental status change History of fall(s);Impaired balance/gait;Impaired mobility;Mental status change   Functional Status Survey:    Vitals:   03/30/16 1058  BP: (!) 166/79  Pulse: 98  Resp: 20  Temp: 98.9 F (37.2 C)  TempSrc: Oral  SpO2: 93%   There is no height or weight on file to calculate BMI. Physical Exam   In general this is a somewhat obese elderly male in no distress but appears somewhat weaker  has increased tremors and shakiness especially upper extremities  His skin is warm and  dry he does have some bruising most apparent arms bilaterally This appears to be chronic there is no diaphoresis      He has venous stasis changes his left lower leg right lower leg both legs are currently wrapped  Eyes sclera and conjunctiva  clear visual acuity appears grossly intact. Somewhat difficult exam since any attempt to examine he shuts  his eyes tightly  Oropharynx is clear mucous membranes moist.  Chest no sign of distress or labored breathing--did not really take many deep breaths but I could not  Heart is regular rate and rhythm with an occasional irregular beat he has again venous stasis changes lower extremities bilaterally.-Has a 2/6 systolic murmur-heart sounds were distant rate appear to be in the 90s  Abdomen is obese soft nontender with positive bowel sounds   GU he does have a Foley catheter draining darkamber colored urine.  Musculoskeletal appears able to move all extremities 4 grip strength appears intact bilaterally-he had a bit of trouble following verbal commands but appear to be able to move his lower extremities   .  Neurologic appear to be grossly intact although limited exam since he did not really follow verbal commands and would not really open his eyes for complete examination-grip strength appear to be intact. appears Able to move all of his extremities I cannot really see lateralizing findings  Has fairly persistent tremors now.  Psych he is oriented to self again had a bit of difficulty following verbal commands somewhat agitated with exam-did not recognize me today-     Labs reviewed:  Recent Labs  02/17/16 1909 03/25/16 1240 03/30/16 0700  NA 139 143 142  K 3.8 3.3* 4.0  CL 105 109 108  CO2 26 29 28   GLUCOSE 100* 103* 104*  BUN 29* 28* 24*  CREATININE 1.04 1.02 0.94  CALCIUM 8.8* 8.9 8.7*  MG  --   --  2.1    Recent Labs  02/14/16 0710 02/17/16 2351 03/25/16 1240  AST 17 25 21   ALT 15* 21 13*    ALKPHOS 59 54 54  BILITOT 0.5 0.3 0.6  PROT 6.6 7.0 7.0  ALBUMIN 2.7* 2.8* 3.1*    Recent Labs  02/14/16 0710 02/17/16 1909 03/25/16 1240  WBC 10.4 8.4 8.6  NEUTROABS 6.7 4.1 5.8  HGB 9.9* 10.5* 11.2*  HCT 31.6* 34.3* 36.1*  MCV 87.1 87.1 88.9  PLT 325 353 279   Lab Results  Component Value Date   TSH 1.960 09/22/2013   Lab Results  Component Value Date   HGBA1C 6.3 (H) 10/24/2015   Lab Results  Component Value Date   CHOL 157 10/06/2012   HDL 34 (L) 10/06/2012   LDLCALC 96 10/06/2012   TRIG 135 10/06/2012   CHOLHDL 4.6 10/06/2012    Significant Diagnostic Results in last 30 days:  No results found.  Assessment/Plan Increased confusion with mental status changes-patient appears to be regressing here appears he had improved somewhat later last week but this appears to be progressing again-he is being treated for pneumonia with Augmentin - and lab work again last week was reassuring nonetheless this confusion appears to be progressing he has having increased tremors-I did discuss this with his daughter via phone-we will send him to the ER for expedient evaluation-  [id number], [place] [phone]

## 2016-03-30 NOTE — ED Provider Notes (Signed)
[facility] DEPT Provider Note   CSN: [id number] Arrival date & time: 03/30/16  1228     History   Chief Complaint Chief Complaint  Patient presents with  . Altered Mental Status    HPI [name] is a 82 y.o. male.  The history is provided by the patient, the nursing home and a relative. The history is limited by the condition of the patient (Hx dementia).    Pt was seen at 1300. Per NH report and family: Pt has had increasing confusion for the past 1 week. Has been associated with "being very tired" and "just falling asleep in his wheelchair." No reported N/V/D, no abd pain, no CP, no fevers.  Pt not ambulatory per baseline. Pt himself has hx of dementia.   Past Medical History:  Diagnosis Date  . AAA (abdominal aortic aneurysm) (HCC)   . AAA (abdominal aortic aneurysm) without rupture (HCC) 10/06/2012  . Anticoagulation goal of INR 2 to 3 06/01/2013  . Arthritis    "probably on the right leg/hip" (09/20/2013)  . Atrial fibrillation (HCC)   . B12 deficiency 09/20/2013  . Benign localized hyperplasia of prostate with urinary retention 12/23/2014  . Bladder calculus 12/23/2014   2.6cm and present since at least 2011 when it was about 1.6cm.   [name]. BPH (benign prostatic hyperplasia)   . Bronchospasm   . Cardiomyopathy (HCC) 10/15/2014  . Cellulitis   . CHF (congestive heart failure) (HCC) 09/26/2013  . Chronic venous insufficiency 10/06/2012  . Cognitive impairment   . COPD (chronic obstructive pulmonary disease) (HCC)   . Daily headache   . Dementia    "don't know exactly what kind" (09/20/2013)  . DVT (deep venous thrombosis) (HCC) 05/04/2013   "LLE"  . E. coli UTI 11/17/15   Cipro X 7 days  . Edema-Bilat Leg and foot 03/07/2013  . Elevated PSA   . Fall at home September 18, 2013  . Glaucoma, both eyes   . Kidney stones   . Multifactorial dementia 10/06/2012  . Nicotine dependence   . Obesity, unspecified 10/06/2012  . Peripheral vascular disease (HCC) 09/20/2012  .  Pyelonephritis, acute 12/22/2014  . Ulcer (HCC)   . Venous insufficiency   . Weakness 09/18/2013  . Wound cellulitis    let ankle wound size of half dollar per nurse at [facility] on 03/25/2015    Patient Active Problem List   Diagnosis Date Noted  . Pre-diabetes 12/16/2015  . Passage of loose stools 10/29/2015  . Cellulitis of leg, right 05/22/2015  . Hydroureteronephrosis 02/11/2015  . Obstructive uropathy 02/10/2015  . Acute renal failure (HCC) 02/10/2015  . Mixed hearing loss, bilateral 01/11/2015  . Benign localized hyperplasia of prostate with urinary retention 12/23/2014  . Bladder calculus 12/23/2014  . Pyelonephritis, acute 12/22/2014  . Dilated cardiomyopathy (HCC)   . Other emphysema (HCC)   . Venous stasis ulcer (HCC)   . Urinary tract infection, recurrent 10/14/2014  . CHF (congestive heart failure) (HCC) 09/26/2013  . Paroxysmal atrial fibrillation with rapid ventricular response (HCC) 09/22/2013  . B12 deficiency 09/20/2013  . History of DVT of lower extremity 08/03/2013  . Anticoagulation goal of INR 2 to 3 06/01/2013  . Edema-Bilat Leg and foot 03/07/2013  . Chronic venous insufficiency 10/06/2012  . COPD (chronic obstructive pulmonary disease) (HCC) 10/06/2012  . Obesity, unspecified 10/06/2012  . AAA (abdominal aortic aneurysm) without rupture (HCC) 10/06/2012  . Cognitive impairment 10/06/2012  . Multifactorial dementia 10/06/2012  . Peripheral vascular disease (  HCC) 09/20/2012    Past Surgical History:  Procedure Laterality Date  . BACK SURGERY    . CATARACT EXTRACTION W/ INTRAOCULAR LENS  IMPLANT, BILATERAL Bilateral   . CHOLECYSTECTOMY  2011   Gall Bladder  . CYSTOSCOPY W/ STONE MANIPULATION  1970's   "once"  . CYSTOSCOPY WITH LITHOLAPAXY N/A 03/28/2015   Procedure: CYSTOSCOPY WITH LITHOLAPAXY;  Surgeon: Malen Gauze, MD;  Location: [facility];  Service: Urology;  Laterality: N/A;  . EYE SURGERY    . FEMUR FRACTURE SURGERY Right  04/1985  . HIP FRACTURE SURGERY Right 2009  . INSITU PERCUTANEOUS PINNING FEMUR Right 1986  . POSTERIOR LAMINECTOMY / DECOMPRESSION LUMBAR SPINE  2004   Decompressive laminectomy unilaterally on the right at L4-5 with  . SPINE SURGERY    . TRANSURETHRAL RESECTION OF PROSTATE N/A 03/28/2015   Procedure: TRANSURETHRAL RESECTION OF THE PROSTATE ;  Surgeon: Malen Gauze, MD;  Location: [facility];  Service: Urology;  Laterality: N/A;       Home Medications    Prior to Admission medications   Medication Sig Start Date End Date Taking? Authorizing Provider  acetaminophen (TYLENOL) 500 MG tablet Take 1,000 mg by mouth every 6 (six) hours as needed for mild pain or fever.     Historical Provider, MD  Amino Acids-Protein Hydrolys (FEEDING SUPPLEMENT, PRO-STAT SUGAR FREE 64,) LIQD Take 30 mLs by mouth 2 (two) times daily between meals.    Historical Provider, MD  amoxicillin-clavulanate (AUGMENTIN) 875-125 MG tablet Take 1 tablet by mouth 2 (two) times daily.    Historical Provider, MD  clotrimazole-betamethasone (LOTRISONE) cream Apply to groin/scrotum rash till resolved every shift    Historical Provider, MD  collagenase (SANTYL) ointment Apply 1 application topically daily.    Historical Provider, MD  diltiazem (CARDIZEM CD) 120 MG 24 hr capsule Take 120 mg by mouth every morning.  10/10/14   Historical Provider, MD  divalproex (DEPAKOTE) 250 MG DR tablet Give 250 mg by mouth once a day. Give 500 mg by mouth at bedtime    Historical Provider, MD  doxazosin (CARDURA) 8 MG tablet Take 8 mg by mouth every morning.     Historical Provider, MD  Fluticasone-Salmeterol (ADVAIR) 250-50 MCG/DOSE AEPB Inhale 1 puff into the lungs 2 (two) times daily.    Historical Provider, MD  ipratropium (ATROVENT) 0.02 % nebulizer solution Give 1 vial inhalation Three times a day. Give 1 inhalation PRN SOB, Wheezing every 4 hours    Historical Provider, MD  latanoprost (XALATAN) 0.005 % ophthalmic solution Place 1 drop  into both eyes at bedtime.  01/25/12   Historical Provider, MD  Magnesium Hydroxide (MILK OF MAGNESIA PO) Give 30 ml by mouth daily as needed for constipation may use fleets enema    Historical Provider, MD  methenamine (HIPREX) 1 g tablet Take 1 g by mouth 2 (two) times daily with a meal.    Historical Provider, MD  metoprolol tartrate (LOPRESSOR) 25 MG tablet Take 0.5 tablets (12.5 mg total) by mouth 2 (two) times daily. 12/26/14   Renae Fickle, MD  potassium chloride SA (K-DUR,KLOR-CON) 20 MEQ tablet Take 20 mEq by mouth daily.    Historical Provider, MD  sertraline (ZOLOFT) 25 MG tablet Take 75 mg by mouth daily. Take three tablets by mouth once daily    Historical Provider, MD  triamcinolone cream (KENALOG) 0.1 % Apply 1 application topically every Monday, Wednesday, and Friday. Apply triamcinolone 0.1 % and 1/2 cetaphil cream  to both Rt. and  Lt. leg before wrapping on Mon,Wed,Fri.    Historical Provider, MD  warfarin (COUMADIN) 4 MG tablet Give 4 mg  once an evening    Historical Provider, MD    Family History Family History  Problem Relation Age of Onset  . Aneurysm Brother   . Heart attack Brother     10-29-12  . Hypertension Brother   . Heart disease Brother     AAA  . Cancer Father     colon    Social History Social History  Substance Use Topics  . Smoking status: Former Smoker    Packs/day: 2.00    Years: 54.00    Types: Cigarettes    Quit date: 08/24/2002  . Smokeless tobacco: Current User    Types: Snuff     Comment: 09/20/2013 "dips snuff"  . Alcohol use No     Allergies   Oxycodone hcl and Propoxyphene n-acetaminophen   Review of Systems Review of Systems  Unable to perform ROS: Dementia     Physical Exam Updated Vital Signs BP 97/55   Pulse 114   Temp 98.7 F (37.1 C) (Rectal)   Resp 19   Ht 6\' 5"  (1.956 m)   Wt (!) 306 lb (138.8 kg)   SpO2 97%   BMI 36.29 kg/m    Patient Vitals for the past 24 hrs:  BP Temp Temp src Pulse Resp SpO2 Height  Weight  03/30/16 1530 111/68 - - 102 19 97 % - -  03/30/16 1500 110/81 - - 112 (!) 5 96 % - -  03/30/16 1430 108/66 - - 100 - 96 % - -  03/30/16 1355 - 98.7 F (37.1 C) Rectal - - - - -  03/30/16 1332 97/55 - - 114 19 97 % - -  03/30/16 1330 109/70 - - (!) 129 24 97 % - -  03/30/16 1247 - - - - - - 6\' 5"  (1.956 m) (!) 306 lb (138.8 kg)  03/30/16 1240 108/83 97.4 F (36.3 C) Oral 108 22 98 % - -      Physical Exam 1305: Physical examination:  Nursing notes reviewed; Vital signs and O2 SAT reviewed;  Constitutional: Well developed, Well nourished, In no acute distress; Head:  Normocephalic, atraumatic; Eyes: EOMI, PERRL, No scleral icterus; ENMT: Mouth and pharynx normal, Mucous membranes dry; Neck: Supple, Full range of motion, No lymphadenopathy; Cardiovascular: Irregular tachycardic rate and rhythm, No gallop; Respiratory: Breath sounds clear & equal bilaterally, No wheezes.  Speaking full sentences with ease, Normal respiratory effort/excursion; Chest: Nontender, Movement normal; Abdomen: Soft, Nontender, Nondistended, Normal bowel sounds; Genitourinary: No CVA tenderness; Extremities: Pulses normal, No tenderness, +1-2 pedal edema bilat. No calf edema;; Neuro: Awake, alert, confused per baseline dementia. No facial droop. +HOH per baseline. Speech clear. Moves all extremities on stretcher spontaneously.; Skin: Color normal, Warm, Dry.   ED Treatments / Results  Labs (all labs ordered are listed, but only abnormal results are displayed)   EKG  EKG Interpretation  Date/Time:  Monday March 30 2016 13:31:10 EDT Ventricular Rate:  116 PR Interval:    QRS Duration: 113 QT Interval:  334 QTC Calculation: 464 R Axis:   -33 Text Interpretation:  Atrial fibrillation Ventricular premature complex Borderline IVCD with LAD Abnormal R-wave progression, late transition Nonspecific T abnormalities, lateral leads When compared with ECG of 02/10/2015 Atrial fibrillation has replaced Sinus  tachycardia Confirmed by [facility]  MD, Nicholos Johns ([id number]) on 03/30/2016 2:32:37 PM       Radiology  Procedures Procedures (including critical care time)  Medications Ordered in ED Medications  0.9 %  sodium chloride infusion (not administered)  sodium chloride 0.9 % bolus 250 mL (not administered)     Initial Impression / Assessment and Plan / ED Course  I have reviewed the triage vital signs and the nursing notes.  Pertinent labs & imaging results that were available during my care of the patient were reviewed by me and considered in my medical decision making (see chart for details).  MDM Reviewed: previous chart, nursing note and vitals Reviewed previous: labs and ECG Interpretation: labs, ECG, x-ray and CT scan   Results for orders placed or performed during the hospital encounter of 03/30/16  Comprehensive metabolic panel  Result Value Ref Range   Sodium 139 135 - 145 mmol/L   Potassium 4.2 3.5 - 5.1 mmol/L   Chloride 108 101 - 111 mmol/L   CO2 24 22 - 32 mmol/L   Glucose, Bld 123 (H) 65 - 99 mg/dL   BUN 24 (H) 6 - 20 mg/dL   Creatinine, Ser 1.610.97 0.61 - 1.24 mg/dL   Calcium 8.7 (L) 8.9 - 10.3 mg/dL   Total Protein 7.1 6.5 - 8.1 g/dL   Albumin 3.1 (L) 3.5 - 5.0 g/dL   AST 19 15 - 41 U/L   ALT 16 (L) 17 - 63 U/L   Alkaline Phosphatase 55 38 - 126 U/L   Total Bilirubin 0.7 0.3 - 1.2 mg/dL   GFR calc non Af Amer >60 >60 mL/min   GFR calc Af Amer >60 >60 mL/min   Anion gap 7 5 - 15  Troponin I  Result Value Ref Range   Troponin I 0.04 (HH) <0.03 ng/mL  Lactic acid, plasma  Result Value Ref Range   Lactic Acid, Venous 1.7 0.5 - 1.9 mmol/L  CBC with Differential  Result Value Ref Range   WBC 9.8 4.0 - 10.5 K/uL   RBC 4.12 (L) 4.22 - 5.81 MIL/uL   Hemoglobin 11.4 (L) 13.0 - 17.0 g/dL   HCT 09.636.8 (L) 04.539.0 - 40.952.0 %   MCV 89.3 78.0 - 100.0 fL   MCH 27.7 26.0 - 34.0 pg   MCHC 31.0 30.0 - 36.0 g/dL   RDW 81.117.8 (H) 91.411.5 - 78.215.5 %   Platelets 304 150 - 400 K/uL    Neutrophils Relative % 65 %   Neutro Abs 6.4 1.7 - 7.7 K/uL   Lymphocytes Relative 14 %   Lymphs Abs 1.3 0.7 - 4.0 K/uL   Monocytes Relative 18 %   Monocytes Absolute 1.7 (H) 0.1 - 1.0 K/uL   Eosinophils Relative 3 %   Eosinophils Absolute 0.3 0.0 - 0.7 K/uL   Basophils Relative 0 %   Basophils Absolute 0.0 0.0 - 0.1 K/uL  Protime-INR  Result Value Ref Range   Prothrombin Time 27.0 (H) 11.4 - 15.2 seconds   INR 2.45   Valproic acid level  Result Value Ref Range   Valproic Acid Lvl 39 (L) 50.0 - 100.0 ug/mL   Dg Chest 2 View Result Date: 03/30/2016 CLINICAL DATA:  Altered mental status. EXAM: CHEST  2 VIEW COMPARISON:  02/17/2016 radiographs. FINDINGS: Suboptimal inspiration, especially on the lateral view. The heart size and mediastinal contours are stable. There is vascular congestion with increased patchy left lower lobe airspace disease. There may be a small amount of pleural fluid bilaterally. No evidence of pneumothorax. The bones appear unchanged. IMPRESSION: Slight worsening of left basilar airspace disease, suspicious for aspiration.  Electronically Signed   By: Carey Bullocks M.D.   On: 03/30/2016 15:03   Ct Head Wo Contrast Result Date: 03/30/2016 CLINICAL DATA:  altered mental status. EXAM: CT HEAD WITHOUT CONTRAST TECHNIQUE: Contiguous axial images were obtained from the base of the skull through the vertex without intravenous contrast. COMPARISON:  None. FINDINGS: Brain: Prominence of the sulci and ventricles identified compatible with brain atrophy. There is diffuse low attenuation throughout the subcortical and periventricular white matter compatible with chronic microvascular disease. Infarcts within the left cerebellar hemisphere are stable from previous exam, image 10 of series 2. There is volume loss from the anterior left temporal lobe, image 12 of series 2. There is a 9 mm dural based hyperdense lesion along the right-sided the anterior falx, image 16 of series 2. This is  favored to represent a benign meningioma. And unchanged from previous exam. No evidence for acute intracranial hemorrhage or infarct. Vascular: No hyperdense vessel or unexpected calcification. Skull: Normal. Negative for fracture or focal lesion. Sinuses/Orbits: No acute finding. Other: None. IMPRESSION: 1. No acute intracranial abnormalities. 2. Brain atrophy and chronic small vessel ischemic disease. 3. Chronic left cerebellar hemisphere infarcts. Electronically Signed   By: Signa Kell M.D.   On: 03/30/2016 14:53    1600:  Pt continues without urine output. Urine in foley bag appears very dark and cloudy; will continue judicious IVF boluses. SBP has slowly improved with IVF. Will start IV abx for HCAP. Dx and testing d/w pt and family.  Questions answered.  Verb understanding, agreeable to admit. T/C to [facility] Dr. Robb Matar, case discussed, including:  HPI, pertinent PM/SHx, VS/PE, dx testing, ED course and treatment:  Agreeable to admit, requests he will come to the ED for evaluation.     Final Clinical Impressions(s) / ED Diagnoses   Final diagnoses:  None    New Prescriptions New Prescriptions   No medications on file     Samuel Jester, DO April 26, 2016 2014

## 2016-03-30 NOTE — ED Notes (Signed)
Pt in the bed with use of Lift. Family at the bedside. States pt has been more confused at time shaking and trembling, leaning to the side at time. Pt fell out of wheelchair outside the on concrete on OCT 5th, pt was not brought to ED

## 2016-03-30 NOTE — Progress Notes (Signed)
ANTICOAGULATION CONSULT NOTE - Initial Consult  Pharmacy Consult for coumadin Indication: atrial fibrillation  Allergies  Allergen Reactions  . Oxycodone Hcl Other (See Comments)     makes pt "wild"  . Propoxyphene N-Acetaminophen Other (See Comments)     Makes pt. "wild"    Patient Measurements: Height: 6\' 5"  (195.6 cm) Weight: (!) 306 lb (138.8 kg) IBW/kg (Calculated) : 89.1  Vital Signs: Temp: 98.7 F (37.1 C) (10/30 1355) Temp Source: Rectal (10/30 1355) BP: 114/64 (10/30 1630) Pulse Rate: 85 (10/30 1630)  Labs:  Recent Labs  03/30/16 0700 03/30/16 1256 03/30/16 1403  HGB  --  11.4*  --   HCT  --  36.8*  --   PLT  --  304  --   LABPROT 26.3*  --  27.0*  INR 2.37  --  2.45  CREATININE 0.94 0.97  --   TROPONINI  --  0.04*  --     Estimated Creatinine Clearance: 90.5 mL/min (by C-G formula based on SCr of 0.97 mg/dL).   Medical History: Past Medical History:  Diagnosis Date  . AAA (abdominal aortic aneurysm) (HCC)   . AAA (abdominal aortic aneurysm) without rupture (HCC) 10/06/2012  . Anticoagulation goal of INR 2 to 3 06/01/2013  . Arthritis    "probably on the right leg/hip" (09/20/2013)  . Atrial fibrillation (HCC)   . B12 deficiency 09/20/2013  . Benign localized hyperplasia of prostate with urinary retention 12/23/2014  . Bladder calculus 12/23/2014   2.6cm and present since at least 2011 when it was about 1.6cm.   [name]. BPH (benign prostatic hyperplasia)   . Bronchospasm   . Cardiomyopathy (HCC) 10/15/2014  . Cellulitis   . CHF (congestive heart failure) (HCC) 09/26/2013  . Chronic venous insufficiency 10/06/2012  . Cognitive impairment   . COPD (chronic obstructive pulmonary disease) (HCC)   . Daily headache   . Dementia    "don't know exactly what kind" (09/20/2013)  . DVT (deep venous thrombosis) (HCC) 05/04/2013   "LLE"  . E. coli UTI 11/17/15   Cipro X 7 days  . Edema-Bilat Leg and foot 03/07/2013  . Elevated PSA   . Fall at home September 18, 2013  .  Glaucoma, both eyes   . Kidney stones   . Multifactorial dementia 10/06/2012  . Nicotine dependence   . Obesity, unspecified 10/06/2012  . Peripheral vascular disease (HCC) 09/20/2012  . Pyelonephritis, acute 12/22/2014  . Ulcer (HCC)   . Venous insufficiency   . Weakness 09/18/2013  . Wound cellulitis    let ankle wound size of half dollar per nurse [facility] on 03/25/2015    Medications:  See medication history  Assessment: 82 yo man to continue coumadin for afib.  INR is therapeutic Goal of Therapy:  INR 2-3 Monitor platelets by anticoagulation protocol: Yes   Plan:  Coumadin 4mg  po tonight Daily PT/INR Monitor for bleeding complications  Itzel Mckibbin Poteet 03/30/2016,7:04 PM

## 2016-03-30 NOTE — ED Notes (Signed)
Emptied 350 cc dark urine from foley bag

## 2016-03-30 NOTE — ED Notes (Signed)
Hosptialist at the bedside to speak with family

## 2016-03-30 NOTE — ED Triage Notes (Signed)
Patient sent from the [facility] with complaint of altered mental status x 1 week. States patient is being treated for pneumonia. States patient is difficulty to wake and has been falling out of his chair. Patient denies pain at triage.

## 2016-03-30 NOTE — Progress Notes (Addendum)
Pharmacy Antibiotic Note  [name] is a 82 y.o. male admitted on 03/30/2016 with pneumonia.  Pharmacy has been consulted for Vancomycin dosing. Normalized CrCl is 6360ml/min Plan: Vancomycin 2000mg  loading dose, then 1000mg  IV every 12 hours.  Goal trough 15-20 mcg/mL.  Also on Cefepime 1gm IV q8h F/U cultures and monitor V/S and clinical progress Vancomycin trough as indicated   Height: 6\' 5"  (195.6 cm) Weight: (!) 306 lb (138.8 kg) IBW/kg (Calculated) : 89.1  Temp (24hrs), Avg:98.3 F (36.8 C), Min:97.4 F (36.3 C), Max:98.9 F (37.2 C)   Recent Labs Lab 03/25/16 1240 03/30/16 0700 03/30/16 1256  WBC 8.6  --  9.8  CREATININE 1.02 0.94 0.97  LATICACIDVEN  --   --  1.7    Estimated Creatinine Clearance: 90.5 mL/min (by C-G formula based on SCr of 0.97 mg/dL).    Allergies  Allergen Reactions  . Oxycodone Hcl Other (See Comments)     makes pt "wild"  . Propoxyphene N-Acetaminophen Other (See Comments)     Makes pt. "wild"    Antimicrobials this admission: Vancomycin 10/30 >>  Cefepime 10/30 >>   Dose adjustments this admission: n/a  Microbiology results: 10/30 BCx: pending 10/30 UCx: pending 10/30 Sputum: pending  Thank you for allowing pharmacy to be a part of this patient's care.  Elder CyphersLorie Maggi Hershkowitz, BS Pharm D, BCPS Clinical Pharmacist Pager [phone] 03/30/2016 4:21 PM

## 2016-03-30 NOTE — ED Notes (Signed)
Pt returned from xray

## 2016-03-30 NOTE — ED Notes (Signed)
Pt family pt has MRSA in Urine

## 2016-03-30 NOTE — H&P (Signed)
History and Physical    [name] ZOX:[id number] DOB: May 19, 1932 DOA: 03/30/2016  PCP: No primary care provider on file.   Patient coming from: Nursing home facility.  Chief Complaint: Altered mental status.  HPI: Joel CeraBryce A Profeta is a 82 y.o. male with medical history significant of AAA, diastolic CHF, chronic atrial fibrillation on warfarin, history of DVT, BPH with chronic Foley insertion, COPD, dementia, obesity, glaucoma, urolithiasis who is a resident [facility] and is being brought to the emergency department with a week his history of altered mental status.  The patient is unable to provide further information other than yes or no questions, but his daughter and wife stated that he has been treated at the facility for pneumonia. However, his mental status has been progressively worse for about a week. He has been very somnolent, at times feeling very difficult to and falling out of his chair. He denies headache, chest pain, abdominal pain, muscle or joint pain when asked directly.  ED Course: Hemoglobin level was 11.4 g/dL, normal WBC, INR was 8.112.45, Lactic acid was 1.7 mmol/L, glucose was 123 and BUN 24 mg/dL. Troponin level was 0.04 ng/mL. Chest radiograph showed worsening left-sided infiltrate and CT scan of the head did not show any acute intracranial pathology.  The patient received supplemental oxygen, at least 1000 mL of normal saline bolus in 3 separate smaller boluses. Also ordered vancomycin and cefepime per pharmacy.  Review of Systems: As per HPI otherwise 10 point review of systems negative.    Past Medical History:  Diagnosis Date  . AAA (abdominal aortic aneurysm) (HCC)   . AAA (abdominal aortic aneurysm) without rupture (HCC) 10/06/2012  . Anticoagulation goal of INR 2 to 3 06/01/2013  . Arthritis    "probably on the right leg/hip" (09/20/2013)  . Atrial fibrillation (HCC)   . B12 deficiency 09/20/2013  . Benign localized hyperplasia of prostate with urinary retention  12/23/2014  . Bladder calculus 12/23/2014   2.6cm and present since at least 2011 when it was about 1.6cm.   [name]. BPH (benign prostatic hyperplasia)   . Bronchospasm   . Cardiomyopathy (HCC) 10/15/2014  . Cellulitis   . CHF (congestive heart failure) (HCC) 09/26/2013  . Chronic venous insufficiency 10/06/2012  . Cognitive impairment   . COPD (chronic obstructive pulmonary disease) (HCC)   . Daily headache   . Dementia    "don't know exactly what kind" (09/20/2013)  . DVT (deep venous thrombosis) (HCC) 05/04/2013   "LLE"  . E. coli UTI 11/17/15   Cipro X 7 days  . Edema-Bilat Leg and foot 03/07/2013  . Elevated PSA   . Fall at home September 18, 2013  . Glaucoma, both eyes   . Kidney stones   . Multifactorial dementia 10/06/2012  . Nicotine dependence   . Obesity, unspecified 10/06/2012  . Peripheral vascular disease (HCC) 09/20/2012  . Pyelonephritis, acute 12/22/2014  . Ulcer (HCC)   . Venous insufficiency   . Weakness 09/18/2013  . Wound cellulitis    let ankle wound size of half dollar per nurse [facility] on 03/25/2015    Past Surgical History:  Procedure Laterality Date  . BACK SURGERY    . CATARACT EXTRACTION W/ INTRAOCULAR LENS  IMPLANT, BILATERAL Bilateral   . CHOLECYSTECTOMY  2011   Gall Bladder  . CYSTOSCOPY W/ STONE MANIPULATION  1970's   "once"  . CYSTOSCOPY WITH LITHOLAPAXY N/A 03/28/2015   Procedure: CYSTOSCOPY WITH LITHOLAPAXY;  Surgeon: Malen GauzePatrick L McKenzie, MD;  Location: [facility];  Service: Urology;  Laterality: N/A;  . EYE SURGERY    . FEMUR FRACTURE SURGERY Right 04/1985  . HIP FRACTURE SURGERY Right 2009  . INSITU PERCUTANEOUS PINNING FEMUR Right 1986  . POSTERIOR LAMINECTOMY / DECOMPRESSION LUMBAR SPINE  2004   Decompressive laminectomy unilaterally on the right at L4-5 with  . SPINE SURGERY    . TRANSURETHRAL RESECTION OF PROSTATE N/A 03/28/2015   Procedure: TRANSURETHRAL RESECTION OF THE PROSTATE ;  Surgeon: Malen Gauze, MD;  Location: [facility];   Service: Urology;  Laterality: N/A;     reports that he quit smoking about 13 years ago. His smoking use included Cigarettes. He has a 108.00 pack-year smoking history. His smokeless tobacco use includes Snuff. He reports that he does not drink alcohol or use drugs.  Allergies  Allergen Reactions  . Oxycodone Hcl Other (See Comments)     makes pt "wild"  . Propoxyphene N-Acetaminophen Other (See Comments)     Makes pt. "wild"    Family History  Problem Relation Age of Onset  . Aneurysm Brother   . Heart attack Brother     10-29-12  . Hypertension Brother   . Heart disease Brother     AAA  . Cancer Father     colon    Prior to Admission medications   Medication Sig Start Date End Date Taking? Authorizing Provider  acetaminophen (TYLENOL) 500 MG tablet Take 1,000 mg by mouth every 6 (six) hours as needed for mild pain or fever.    Yes Historical Provider, MD  Amino Acids-Protein Hydrolys (FEEDING SUPPLEMENT, PRO-STAT SUGAR FREE 64,) LIQD Take 30 mLs by mouth 2 (two) times daily between meals.   Yes Historical Provider, MD  amoxicillin-clavulanate (AUGMENTIN) 875-125 MG tablet Take 1 tablet by mouth 2 (two) times daily.   Yes Historical Provider, MD  collagenase (SANTYL) ointment Apply 1 application topically daily.   Yes Historical Provider, MD  diltiazem (CARDIZEM CD) 120 MG 24 hr capsule Take 120 mg by mouth every morning.  10/10/14  Yes Historical Provider, MD  divalproex (DEPAKOTE) 250 MG DR tablet Take 250-500 mg by mouth at bedtime. 250mg  in the morning and 500mg  in the evening   Yes Historical Provider, MD  doxazosin (CARDURA) 8 MG tablet Take 8 mg by mouth every morning.    Yes Historical Provider, MD  Fluticasone-Salmeterol (ADVAIR) 250-50 MCG/DOSE AEPB Inhale 1 puff into the lungs 2 (two) times daily.   Yes Historical Provider, MD  ipratropium (ATROVENT) 0.02 % nebulizer solution Take 0.5 mg by nebulization daily. *May also give one vial via nebulizer every 4 hours as needed  for wheezing and/or shortness of breath   Yes Historical Provider, MD  ipratropium-albuterol (DUONEB) 0.5-2.5 (3) MG/3ML SOLN Take 3 mLs by nebulization 3 (three) times daily.   Yes Historical Provider, MD  latanoprost (XALATAN) 0.005 % ophthalmic solution Place 1 drop into both eyes at bedtime.  01/25/12  Yes Historical Provider, MD  Magnesium Hydroxide (MILK OF MAGNESIA PO) Give 30 ml by mouth daily as needed for constipation may use fleets enema   Yes Historical Provider, MD  magnesium hydroxide (MILK OF MAGNESIA) 400 MG/5ML suspension Take 30 mLs by mouth daily as needed for mild constipation.   Yes Historical Provider, MD  methenamine (HIPREX) 1 g tablet Take 1 g by mouth 2 (two) times daily with a meal.   Yes Historical Provider, MD  metoprolol tartrate (LOPRESSOR) 25 MG tablet Take 0.5 tablets (12.5 mg total)  by mouth 2 (two) times daily. 12/26/14  Yes Renae Fickle, MD  potassium chloride SA (K-DUR,KLOR-CON) 20 MEQ tablet Take 20 mEq by mouth daily.   Yes Historical Provider, MD  sertraline (ZOLOFT) 25 MG tablet Take 75 mg by mouth daily. Take three tablets by mouth once daily   Yes Historical Provider, MD  warfarin (COUMADIN) 4 MG tablet Take 4 mg by mouth every evening.    Yes Historical Provider, MD    Physical Exam:  Constitutional: NAD, calm, confused. Vitals:   03/30/16 1500 03/30/16 1530 03/30/16 1600 03/30/16 1630  BP: 110/81 111/68 106/78 114/64  Pulse: 112 102  85  Resp: (!) 5 19 17 16   Temp:      TempSrc:      SpO2: 96% 97%  97%  Weight:      Height:       Eyes: PERRL, lids and conjunctivae normal ENMT: Mucous membranes are very dry. Posterior pharynx clear of any exudate or lesions.  Neck: Normal, supple, no masses, no thyromegaly Respiratory: Decreased breath sounds on the bases. Positive fine crackles on LEFT lower and middle fields. No accessory muscle use.  Cardiovascular: Regular rate and rhythm, no murmurs / rubs / gallops. No extremity edema. 2+ pedal pulses.  No carotid bruits.  Abdomen: Obese, bowel sounds positive. Soft, no tenderness, no masses palpated. No hepatosplenomegaly.  Musculoskeletal: no clubbing / cyanosis. Overall decreased ROM due to generalized weakness, no contractures. Normal muscle tone.  Skin: no rashes, lesions, ulcers on limited skin exam Neurologic: CN 2-12 grossly intact. Sensation intact, DTR normal. Moves all extremities, but unable to fully evaluate.  Psychiatric:  Alert and oriented x 1. Normal mood.    Labs on Admission: I have personally reviewed following labs and imaging studies  CBC:  Recent Labs Lab 03/25/16 1240 03/30/16 1256  WBC 8.6 9.8  NEUTROABS 5.8 6.4  HGB 11.2* 11.4*  HCT 36.1* 36.8*  MCV 88.9 89.3  PLT 279 304   Basic Metabolic Panel:  Recent Labs Lab 03/25/16 1240 03/30/16 0700 03/30/16 1256  NA 143 142 139  K 3.3* 4.0 4.2  CL 109 108 108  CO2 29 28 24   GLUCOSE 103* 104* 123*  BUN 28* 24* 24*  CREATININE 1.02 0.94 0.97  CALCIUM 8.9 8.7* 8.7*  MG  --  2.1  --    GFR: Estimated Creatinine Clearance: 90.5 mL/min (by C-G formula based on SCr of 0.97 mg/dL). Liver Function Tests:  Recent Labs Lab 03/25/16 1240 03/30/16 1256  AST 21 19  ALT 13* 16*  ALKPHOS 54 55  BILITOT 0.6 0.7  PROT 7.0 7.1  ALBUMIN 3.1* 3.1*   No results for input(s): LIPASE, AMYLASE in the last 168 hours.  Recent Labs Lab 03/25/16 1240  AMMONIA 19   Coagulation Profile:  Recent Labs Lab 03/27/16 0730 03/30/16 0700 03/30/16 1403  INR 1.99 2.37 2.45   Cardiac Enzymes:  Recent Labs Lab 03/30/16 1256  TROPONINI 0.04*   BNP (last 3 results) No results for input(s): PROBNP in the last 8760 hours. HbA1C: No results for input(s): HGBA1C in the last 72 hours. CBG: No results for input(s): GLUCAP in the last 168 hours. Lipid Profile: No results for input(s): CHOL, HDL, LDLCALC, TRIG, CHOLHDL, LDLDIRECT in the last 72 hours. Thyroid Function Tests: No results for input(s): TSH, T4TOTAL,  FREET4, T3FREE, THYROIDAB in the last 72 hours. Anemia Panel: No results for input(s): VITAMINB12, FOLATE, FERRITIN, TIBC, IRON, RETICCTPCT in the last 72 hours. Urine analysis:  Component Value Date/Time   COLORURINE YELLOW 02/17/2016 2235   APPEARANCEUR HAZY (A) 02/17/2016 2235   LABSPEC 1.020 02/17/2016 2235   PHURINE 5.5 02/17/2016 2235   GLUCOSEU NEGATIVE 02/17/2016 2235   HGBUR LARGE (A) 02/17/2016 2235   BILIRUBINUR NEGATIVE 02/17/2016 2235   BILIRUBINUR neg 11/27/2014 1133   KETONESUR NEGATIVE 02/17/2016 2235   PROTEINUR 30 (A) 02/17/2016 2235   UROBILINOGEN 1.0 04/09/2015 1225   NITRITE NEGATIVE 02/17/2016 2235   LEUKOCYTESUR MODERATE (A) 02/17/2016 2235    Recent Results (from the past 240 hour(s))  Culture, blood (routine x 2) Call MD if unable to obtain prior to antibiotics being given     Status: None (Preliminary result)   Collection Time: 03/30/16  4:31 PM  Result Value Ref Range Status   Specimen Description BLOOD RIGHT ARM  Final   Special Requests BOTTLES DRAWN AEROBIC AND ANAEROBIC 8CC  Final   Culture PENDING  Incomplete   Report Status PENDING  Incomplete  Culture, blood (routine x 2) Call MD if unable to obtain prior to antibiotics being given     Status: None (Preliminary result)   Collection Time: 03/30/16  4:40 PM  Result Value Ref Range Status   Specimen Description BLOOD RIGHT ARM  Final   Special Requests BOTTLES DRAWN AEROBIC AND ANAEROBIC 8CC  Final   Culture PENDING  Incomplete   Report Status PENDING  Incomplete     Radiological Exams on Admission: Dg Chest 2 View  Result Date: 03/30/2016 CLINICAL DATA:  Altered mental status. EXAM: CHEST  2 VIEW COMPARISON:  02/17/2016 radiographs. FINDINGS: Suboptimal inspiration, especially on the lateral view. The heart size and mediastinal contours are stable. There is vascular congestion with increased patchy left lower lobe airspace disease. There may be a small amount of pleural fluid bilaterally. No  evidence of pneumothorax. The bones appear unchanged. IMPRESSION: Slight worsening of left basilar airspace disease, suspicious for aspiration. Electronically Signed   By: Carey BullocksWilliam  Veazey M.D.   On: 03/30/2016 15:03   Ct Head Wo Contrast  Result Date: 03/30/2016 CLINICAL DATA:  altered mental status. EXAM: CT HEAD WITHOUT CONTRAST TECHNIQUE: Contiguous axial images were obtained from the base of the skull through the vertex without intravenous contrast. COMPARISON:  None. FINDINGS: Brain: Prominence of the sulci and ventricles identified compatible with brain atrophy. There is diffuse low attenuation throughout the subcortical and periventricular white matter compatible with chronic microvascular disease. Infarcts within the left cerebellar hemisphere are stable from previous exam, image 10 of series 2. There is volume loss from the anterior left temporal lobe, image 12 of series 2. There is a 9 mm dural based hyperdense lesion along the right-sided the anterior falx, image 16 of series 2. This is favored to represent a benign meningioma. And unchanged from previous exam. No evidence for acute intracranial hemorrhage or infarct. Vascular: No hyperdense vessel or unexpected calcification. Skull: Normal. Negative for fracture or focal lesion. Sinuses/Orbits: No acute finding. Other: None. IMPRESSION: 1. No acute intracranial abnormalities. 2. Brain atrophy and chronic small vessel ischemic disease. 3. Chronic left cerebellar hemisphere infarcts. Electronically Signed   By: Signa Kellaylor  Stroud M.D.   On: 03/30/2016 14:53   Echocardiogram 10/15/2014  ------------------------------------------------------------------- LV EF: 55% -   60%  ------------------------------------------------------------------- Indications:      425.4 Other primary Cardiomyopathies.  ------------------------------------------------------------------- History:   PMH:  Abdominal aortic aneurysm.  Altered mental status.  Atrial  fibrillation.  Atrial fibrillation.  Congestive heart failure.  Bacteremia.  Chronic obstructive pulmonary  disease.  ------------------------------------------------------------------- Study Conclusions  - Left ventricle: Systolic function was normal. The estimated   ejection fraction was in the range of 55% to 60%. Although no   diagnostic regional wall motion abnormality was identified, this   possibility cannot be completely excluded on the basis of this   study. Doppler parameters are consistent with abnormal left   ventricular relaxation (grade 1 diastolic dysfunction). - Aortic valve: Valve area (VTI): 3.27 cm^2. Valve area (Vmax):   3.69 cm^2. Valve area (Vmean): 3.08 cm^2.  EKG: Independently reviewed. Vent. rate 116 BPM PR interval * ms QRS duration 113 ms QT/QTc 334/464 ms P-R-T axes * -33 86 Atrial fibrillation Ventricular premature complex Borderline IVCD with LAD Abnormal R-wave progression, late transition Nonspecific T abnormalities, lateral leads Previous EKGs from September and July shows sinus tachycardia.  Assessment/Plan Principal Problem:   HCAP (healthcare-associated pneumonia) Admit to telemetry/inpatient. Continue supplemental oxygen. Duo nebs every 6 hours. Albuterol 2.5 mg every 4 hours as needed for dyspnea/wheezing. Continue cefepime and vancomycin per pharmacy. Check a sputum Gram stain, culture and sensitivity. Follow-up blood cultures and sensitivity. Check a strep pneumoniae and legionella urinary antigens.  Active Problems:   Elevated troponin Continue cardiac monitoring. Trend troponin levels. Check echocardiogram in the morning.    Paroxysmal atrial fibrillation with rapid ventricular response (HCC) CHA2DS2-VASc Score of at least 6. Rate was in the 120's earlier, but decreased spontaneously to the 80s. Continue metoprolol 12.5 mg by mouth twice a day. Continue Cardizem and CD 120 mg by mouth daily. Continue warfarin per  pharmacy.    Peripheral vascular disease (HCC) Continue warfarin per pharmacy.    COPD (chronic obstructive pulmonary disease) (HCC) Continue supplemental oxygen. DuoNeb every 6 hours via nebulizer. Albuterol 2.5 mg every 4 hours via nebulizer as needed.    History of DVT of lower extremity Continue warfarin per pharmacy.    Obstructive uropathy Has chronic indwelling Foley catheter. Continue Cardiolite milligrams by mouth daily.    Pre-diabetes Last hemoglobin A1c was 6.3% in May/2017. Carbohydrate modified diet. CBG monitoring before meals and at bedtime.      Chronic diastolic CHF (congestive heart failure) (HCC) Daily weights. Monitor intake and output. Continue metoprolol 12.5 mg by mouth twice a day. Check echocardiogram in the morning.      MRSA carrier Per daughter, he was found to have MRSA in the urine. Contact isolation and MRSA screening. Continue Hiprex 1 g by mouth twice a day.    DVT prophylaxis: On chronic warfarin for chronic A. fib and history of DVT. Code Status: DO NOT RESUSCITATE Family Communication: His daughter [name] and his wife are present in the room. Disposition Plan: Admit for IV antibiotic therapy for several days. Consults called:  Admission status: Inpatient/telemetry.     Bobette Mo MD [facility] Pager [phone].  If 7PM-7AM, please contact night-coverage [email or web address] Password [email or web address]  03/30/2016, 5:20 PM

## 2016-03-30 NOTE — ED Notes (Signed)
Pt on monitor 

## 2016-03-31 DIAGNOSIS — J189 Pneumonia, unspecified organism: Secondary | ICD-10-CM | POA: Diagnosis not present

## 2016-03-31 DIAGNOSIS — R748 Abnormal levels of other serum enzymes: Secondary | ICD-10-CM | POA: Diagnosis not present

## 2016-03-31 DIAGNOSIS — Z86718 Personal history of other venous thrombosis and embolism: Secondary | ICD-10-CM | POA: Diagnosis not present

## 2016-03-31 DIAGNOSIS — I5032 Chronic diastolic (congestive) heart failure: Secondary | ICD-10-CM | POA: Diagnosis not present

## 2016-03-31 LAB — CBC WITH DIFFERENTIAL/PLATELET
Basophils Absolute: 0 10*3/uL (ref 0.0–0.1)
Basophils Relative: 1 %
EOS PCT: 11 %
Eosinophils Absolute: 0.7 10*3/uL (ref 0.0–0.7)
HCT: 31.5 % — ABNORMAL LOW (ref 39.0–52.0)
Hemoglobin: 9.5 g/dL — ABNORMAL LOW (ref 13.0–17.0)
LYMPHS ABS: 1.7 10*3/uL (ref 0.7–4.0)
LYMPHS PCT: 29 %
MCH: 26.9 pg (ref 26.0–34.0)
MCHC: 30.2 g/dL (ref 30.0–36.0)
MCV: 89.2 fL (ref 78.0–100.0)
MONO ABS: 1 10*3/uL (ref 0.1–1.0)
MONOS PCT: 16 %
Neutro Abs: 2.7 10*3/uL (ref 1.7–7.7)
Neutrophils Relative %: 43 %
PLATELETS: 262 10*3/uL (ref 150–400)
RBC: 3.53 MIL/uL — ABNORMAL LOW (ref 4.22–5.81)
RDW: 17.9 % — AB (ref 11.5–15.5)
WBC: 6.1 10*3/uL (ref 4.0–10.5)

## 2016-03-31 LAB — COMPREHENSIVE METABOLIC PANEL
ALBUMIN: 2.5 g/dL — AB (ref 3.5–5.0)
ALT: 13 U/L — AB (ref 17–63)
AST: 12 U/L — AB (ref 15–41)
Alkaline Phosphatase: 45 U/L (ref 38–126)
Anion gap: 5 (ref 5–15)
BUN: 21 mg/dL — AB (ref 6–20)
CHLORIDE: 110 mmol/L (ref 101–111)
CO2: 26 mmol/L (ref 22–32)
CREATININE: 0.88 mg/dL (ref 0.61–1.24)
Calcium: 8.3 mg/dL — ABNORMAL LOW (ref 8.9–10.3)
GFR calc Af Amer: >60 mL/min (ref >60)
GFR calc non Af Amer: >60 mL/min (ref >60)
GLUCOSE: 116 mg/dL — AB (ref 65–99)
POTASSIUM: 3.8 mmol/L (ref 3.5–5.1)
Sodium: 141 mmol/L (ref 135–145)
Total Bilirubin: 0.7 mg/dL (ref 0.3–1.2)
Total Protein: 5.8 g/dL — ABNORMAL LOW (ref 6.5–8.1)

## 2016-03-31 LAB — HIV ANTIBODY (ROUTINE TESTING W REFLEX): HIV SCREEN 4TH GENERATION: NONREACTIVE

## 2016-03-31 LAB — TROPONIN I: Troponin I: 0.03 ng/mL (ref <0.03)

## 2016-03-31 LAB — PROTIME-INR
INR: 3.13
Prothrombin Time: 32.9 seconds — ABNORMAL HIGH (ref 11.4–15.2)

## 2016-03-31 MED ORDER — METHENAMINE MANDELATE 1 G PO TABS
1000.0000 mg | ORAL_TABLET | Freq: Two times a day (BID) | ORAL | Status: DC
  Administered 2016-03-31 – 2016-04-01 (×2): 1000 mg via ORAL
  Filled 2016-03-31 (×7): qty 1

## 2016-03-31 NOTE — Progress Notes (Signed)
ANTICOAGULATION CONSULT NOTE   Pharmacy Consult for coumadin Indication: atrial fibrillation  Allergies  Allergen Reactions  . Oxycodone Hcl Other (See Comments)     makes pt "wild"  . Propoxyphene N-Acetaminophen Other (See Comments)     Makes pt. "wild"    Patient Measurements: Height: 6\' 5"  (195.6 cm) Weight: 298 lb 6 oz (135.3 kg) IBW/kg (Calculated) : 89.1  Vital Signs: Temp: 98.1 F (36.7 C) (10/31 0651) Temp Source: Oral (10/31 0651) BP: 130/65 (10/31 0651) Pulse Rate: 93 (10/31 0651)  Labs:  Recent Labs  03/30/16 0700 03/30/16 1256 03/30/16 1403 03/30/16 1640 03/31/16 0013 03/31/16 0627  HGB  --  11.4*  --   --   --  9.5*  HCT  --  36.8*  --   --   --  31.5*  PLT  --  304  --   --   --  262  LABPROT 26.3*  --  27.0*  --   --  32.9*  INR 2.37  --  2.45  --   --  3.13  CREATININE 0.94 0.97  --   --   --  0.88  TROPONINI  --  0.04*  --  0.04* 0.03*  --     Estimated Creatinine Clearance: 98.5 mL/min (by C-G formula based on SCr of 0.88 mg/dL).   Medical History: Past Medical History:  Diagnosis Date  . AAA (abdominal aortic aneurysm) (HCC)   . AAA (abdominal aortic aneurysm) without rupture (HCC) 10/06/2012  . Anticoagulation goal of INR 2 to 3 06/01/2013  . Arthritis    "probably on the right leg/hip" (09/20/2013)  . Atrial fibrillation (HCC)   . B12 deficiency 09/20/2013  . Benign localized hyperplasia of prostate with urinary retention 12/23/2014  . Bladder calculus 12/23/2014   2.6cm and present since at least 2011 when it was about 1.6cm.   [name]. BPH (benign prostatic hyperplasia)   . Bronchospasm   . Cardiomyopathy (HCC) 10/15/2014  . Cellulitis   . CHF (congestive heart failure) (HCC) 09/26/2013  . Chronic venous insufficiency 10/06/2012  . Cognitive impairment   . COPD (chronic obstructive pulmonary disease) (HCC)   . Daily headache   . Dementia    "don't know exactly what kind" (09/20/2013)  . DVT (deep venous thrombosis) (HCC) 05/04/2013   "LLE"   . E. coli UTI 11/17/15   Cipro X 7 days  . Edema-Bilat Leg and foot 03/07/2013  . Elevated PSA   . Fall at home September 18, 2013  . Glaucoma, both eyes   . Kidney stones   . Multifactorial dementia 10/06/2012  . Nicotine dependence   . Obesity, unspecified 10/06/2012  . Peripheral vascular disease (HCC) 09/20/2012  . Pyelonephritis, acute 12/22/2014  . Ulcer (HCC)   . Venous insufficiency   . Weakness 09/18/2013  . Wound cellulitis    let ankle wound size of half dollar [facility] on 03/25/2015    Medications:  See medication history  Assessment: 82 yo man to continue coumadin for afib.  INR is supratherapeutic at 3.13 this AM. Goal of Therapy:  INR 2-3 Monitor platelets by anticoagulation protocol: Yes   Plan:  No coumadin today Daily PT/INR Monitor for bleeding complications  Elder CyphersLorie Cairo Lingenfelter, BS Loura BackPharm D, BCPS Clinical Pharmacist Pager [phone] 03/31/2016,8:56 AM

## 2016-03-31 NOTE — Progress Notes (Signed)
PROGRESS NOTE    [name]  ZOX:[id number] DOB: 10/22/1931 DOA: 03/30/2016 PCP: No primary care provider on file.   Brief Narrative:  38 yom with a PMH of AAA, COPD on home oxygen, Chronic diastolic CHF, AFib on coumadin, DVT, BPH with a chronic foley catheter, and dementia, presents from [facility] with altered mental status. While in the ED, she was noted to have a mildly elevated troponin. MRSA PCR was positive. Chest xray shows slightly worsening left basilar airspace disease. She was started on a nebulizer, vancomycin, and cefepime. Admitted for further evaluation of hospital acquired pneumonia. Wife stated that he generally does not have any evidence of aspiration normally.   Assessment & Plan:   Principal Problem:   HCAP (healthcare-associated pneumonia) Active Problems:   Peripheral vascular disease (HCC)   COPD (chronic obstructive pulmonary disease) (HCC)   History of DVT of lower extremity   Paroxysmal atrial fibrillation with rapid ventricular response (HCC)   Obstructive uropathy   Pre-diabetes   Chronic diastolic CHF (congestive heart failure) (HCC)   Elevated troponin   MRSA carrier  1. HCAP. Patient was afebrile on admission. Chest X-ray shows possible aspiration. Will follow blood and sputum cultures. Continue on supplemental oxygen, pulmonary hygiene, and antibiotics cefepime and vancomycin.  2. Paroxysmal atrial fibrillation with RVR. Patient has a CHADSVASc score of 6.  He is anticoagulated on coumadin, will continue. EKG shows atrial fibrillation. Continue metoprolol and Cardizem.  3. COPD. Continue supplemental oxygen, nebs, and albuterol.  4. Mildly elevated troponin. Will trend troponin. Check echocardiogram.  5. Chronic diastolic congestive heart failure. Check echocardiogram. Monitor intake and output.  6. Pre-diabetes. Last hemoglobin A1c was 6.3% in may 2017. Continue to monitor.  7. Hx of lower extremity DVT. Continue coumadin.  8. Obstructive  uropathy. Patient has a chronic indwelling foley catheter.  9. Dementia.   DVT prophylaxis: Coumadin  Code Status: DO NOT RESUSCITATE ( I confirmed with his wife today.) Family Communication: Wife bedside Disposition Plan: Discharge SNF once improved.    Consultants:   None   Procedures:   None   Antimicrobials:   Cefipime 10/30 >>    Vancomycin 10/30 >>   Subjective: Doing well. He is only oriented to himself. Per his wife he has not been eating well.   Objective: Vitals:   03/30/16 2113 03/31/16 0122 03/31/16 0500 03/31/16 0651  BP: (!) 109/94   130/65  Pulse: (!) 104   93  Resp: 20   20  Temp: 98.4 F (36.9 C)   98.1 F (36.7 C)  TempSrc: Oral   Oral  SpO2: 98% 95%  95%  Weight:   135.3 kg (298 lb 6 oz)   Height:        Intake/Output Summary (Last 24 hours) at 03/31/16 0713 Last data filed at 03/31/16 0651  Gross per 24 hour  Intake          1839.67 ml  Output             1100 ml  Net           739.67 ml   Filed Weights   03/30/16 1247 03/31/16 0500  Weight: (!) 138.8 kg (306 lb) 135.3 kg (298 lb 6 oz)    Examination:  General exam: Appears calm and comfortable  Respiratory system: Clear to auscultation. Respiratory effort normal. Cardiovascular system: S1 & S2 heard, RRR. No JVD, murmurs, rubs, gallops or clicks. No pedal edema. Gastrointestinal system: Abdomen is nondistended, soft and  nontender. No organomegaly or masses felt. Normal bowel sounds heard. Central nervous system: Alert and oriented. No focal neurological deficits. Extremities: Symmetric 5 x 5 power. Skin: No rashes, lesions or ulcers Psychiatry: Judgement and insight appear normal. Oriented to self only.    Data Reviewed: I have personally reviewed following labs and imaging studies   Recent Labs Lab 03/25/16 1240 03/30/16 1256 03/31/16 0627  WBC 8.6 9.8 6.1  NEUTROABS 5.8 6.4 2.7  HGB 11.2* 11.4* 9.5*  HCT 36.1* 36.8* 31.5*  MCV 88.9 89.3 89.2  PLT 279 304 262     Recent Labs Lab 03/25/16 1240 03/30/16 0700 03/30/16 1256 03/30/16 1640  NA 143 142 139  --   K 3.3* 4.0 4.2  --   CL 109 108 108  --   CO2 29 28 24   --   GLUCOSE 103* 104* 123*  --   BUN 28* 24* 24*  --   CREATININE 1.02 0.94 0.97  --   CALCIUM 8.9 8.7* 8.7*  --   MG  --  2.1  --  2.0  PHOS  --   --   --  2.8    Recent Labs Lab 03/25/16 1240 03/30/16 1256  AST 21 19  ALT 13* 16*  ALKPHOS 54 55  BILITOT 0.6 0.7  PROT 7.0 7.1  ALBUMIN 3.1* 3.1*    Recent Labs Lab 03/25/16 1240  AMMONIA 19    Recent Labs Lab 03/27/16 0730 03/30/16 0700 03/30/16 1403  INR 1.99 2.37 2.45    Recent Labs Lab 03/30/16 1256 03/30/16 1640 03/31/16 0013  TROPONINI 0.04* 0.04* 0.03*    Recent Labs Lab 03/30/16 1256  LATICACIDVEN 1.7    Recent Results (from the past 240 hour(s))  Culture, blood (routine x 2) Call MD if unable to obtain prior to antibiotics being given     Status: None (Preliminary result)   Collection Time: 03/30/16  4:31 PM  Result Value Ref Range Status   Specimen Description BLOOD RIGHT ARM  Final   Special Requests BOTTLES DRAWN AEROBIC AND ANAEROBIC 8CC  Final   Culture PENDING  Incomplete   Report Status PENDING  Incomplete  Culture, blood (routine x 2) Call MD if unable to obtain prior to antibiotics being given     Status: None (Preliminary result)   Collection Time: 03/30/16  4:40 PM  Result Value Ref Range Status   Specimen Description BLOOD RIGHT ARM  Final   Special Requests BOTTLES DRAWN AEROBIC AND ANAEROBIC 8CC  Final   Culture PENDING  Incomplete   Report Status PENDING  Incomplete  MRSA PCR Screening     Status: Abnormal   Collection Time: 03/30/16  6:55 PM  Result Value Ref Range Status   MRSA by PCR POSITIVE (A) NEGATIVE Final    Comment: RESULT CALLED TO, READ BACK BY AND VERIFIED WITH: TETREAULT,H AT 2100 ON 10.30.17 BY ISLEY,B        The GeneXpert MRSA Assay (FDA approved for NASAL specimens only), is one component of  a comprehensive MRSA colonization surveillance program. It is not intended to diagnose MRSA infection nor to guide or monitor treatment for MRSA infections.          Radiology Studies: Dg Chest 2 View  Result Date: 03/30/2016 CLINICAL DATA:  Altered mental status. EXAM: CHEST  2 VIEW COMPARISON:  02/17/2016 radiographs. FINDINGS: Suboptimal inspiration, especially on the lateral view. The heart size and mediastinal contours are stable. There is vascular congestion with increased patchy left  lower lobe airspace disease. There may be a small amount of pleural fluid bilaterally. No evidence of pneumothorax. The bones appear unchanged. IMPRESSION: Slight worsening of left basilar airspace disease, suspicious for aspiration. Electronically Signed   By: Carey BullocksWilliam  Veazey M.D.   On: 03/30/2016 15:03   Ct Head Wo Contrast  Result Date: 03/30/2016 CLINICAL DATA:  altered mental status. EXAM: CT HEAD WITHOUT CONTRAST TECHNIQUE: Contiguous axial images were obtained from the base of the skull through the vertex without intravenous contrast. COMPARISON:  None. FINDINGS: Brain: Prominence of the sulci and ventricles identified compatible with brain atrophy. There is diffuse low attenuation throughout the subcortical and periventricular white matter compatible with chronic microvascular disease. Infarcts within the left cerebellar hemisphere are stable from previous exam, image 10 of series 2. There is volume loss from the anterior left temporal lobe, image 12 of series 2. There is a 9 mm dural based hyperdense lesion along the right-sided the anterior falx, image 16 of series 2. This is favored to represent a benign meningioma. And unchanged from previous exam. No evidence for acute intracranial hemorrhage or infarct. Vascular: No hyperdense vessel or unexpected calcification. Skull: Normal. Negative for fracture or focal lesion. Sinuses/Orbits: No acute finding. Other: None. IMPRESSION: 1. No acute  intracranial abnormalities. 2. Brain atrophy and chronic small vessel ischemic disease. 3. Chronic left cerebellar hemisphere infarcts. Electronically Signed   By: Signa Kellaylor  Stroud M.D.   On: 03/30/2016 14:53        Scheduled Meds: . ceFEPime (MAXIPIME) IV  1 g Intravenous Q8H  . Chlorhexidine Gluconate Cloth  6 each Topical Q0600  . collagenase  1 application Topical Daily  . diltiazem  120 mg Oral q morning - 10a  . divalproex  250 mg Oral Daily   And  . divalproex  500 mg Oral QHS  . doxazosin  8 mg Oral q morning - 10a  . ipratropium-albuterol  3 mL Nebulization Q6H  . latanoprost  1 drop Both Eyes QHS  . methenamine  1 g Oral BID WC  . metoprolol tartrate  12.5 mg Oral BID  . mometasone-formoterol  2 puff Inhalation BID  . mupirocin ointment  1 application Nasal BID  . potassium chloride SA  20 mEq Oral Daily  . sertraline  75 mg Oral Daily  . sodium chloride  500 mL Intravenous Once  . sodium chloride flush  3 mL Intravenous Q12H  . vancomycin  1,000 mg Intravenous Q12H  . Warfarin - Pharmacist Dosing Inpatient   Does not apply q1800   Continuous Infusions: . sodium chloride 100 mL/hr at 03/30/16 2337     LOS: 1 day  Time spent: 25 minutes   Houston SirenPeter Thatiana Renbarger, MD FACP [facility] If 7PM-7AM, please contact night-coverage [email or web address] Password [email or web address] 03/31/2016, 7:13 AM   By signing my name below, I, Cynda AcresHailei Fulton, attest that this documentation has been prepared under the direction and in the presence of Houston SirenPeter Jaylissa Felty, MD. Electronically signed: Cynda AcresHailei Fulton, Scribe. 03/31/16 11:50 AM

## 2016-03-31 NOTE — Clinical Social Work Note (Signed)
Clinical Social Work Assessment  Patient Details  Name: [name] MRN: [id number] Date of Birth: [birth year]  Date of referral:  03/31/16               Reason for consult:  Discharge Planning                Permission sought to share information with:    Permission granted to share information::     Name::        Agency::     Relationship::     Contact Information:     Housing/Transportation Living arrangements for the past 2 months:  Skilled Nursing Facility Source of Information:  Adult Children Patient Interpreter Needed:  None Criminal Activity/Legal Involvement Pertinent to Current Situation/Hospitalization:  No - Comment as needed Significant Relationships:  Adult Children, Spouse Lives with:  Facility Resident Do you feel safe going back to the place where you live?  Yes Need for family participation in patient care:  Yes (Comment)  Care giving concerns:  None reported. [facility]    Social Worker assessment / plan:  CSW attempted to meet with pt at bedside. Pt oriented to self only. CSW spoke with pt's daughter, Alona BeneJoyce on phone. Alona BeneJoyce reports [facility]. Family is very involved and supportive. Per Lynnea FerrierKerri at facility, pt requires max assist with transfers and uses a wheelchair. He is nursing level of care and okay to return. No FL2 necessary unless pt returns skilled. Pt came to ED due to AMS. He has been receiving antibiotics for pneumonia at SNF.   Employment status:  Retired Database administratornsurance information:  Managed Medicare PT Recommendations:  Not assessed at this time Information / Referral to community resources:  Other (Comment Required) ([facility]  Patient/Family's Response to care:  Pt's [facility].   Patient/Family's Understanding of and Emotional Response to Diagnosis, Current Treatment, and Prognosis:  Pt's daughter appears to be knowledgeable of pt's  medical history and admission diagnosis.   Emotional Assessment Appearance:  Appears stated age Attitude/Demeanor/Rapport:  Unable to Assess Affect (typically observed):  Unable to Assess Orientation:  Oriented to Self Alcohol / Substance use:    Psych involvement (Current and /or in the community):  No (Comment)  Discharge Needs  Concerns to be addressed:  Discharge Planning Concerns Readmission within the last 30 days:  No Current discharge risk:  Cognitively Impaired Barriers to Discharge:  Continued Medical Work up   Karn CassisStultz, Alanea Woolridge [place]aberger, LCSW 03/31/2016, 10:39 AM [phone]

## 2016-04-01 ENCOUNTER — Inpatient Hospital Stay
Admission: RE | Admit: 2016-04-01 | Discharge: 2016-07-30 | Disposition: E | Payer: Medicare Other | Source: Ambulatory Visit | Attending: Internal Medicine | Admitting: Internal Medicine

## 2016-04-01 ENCOUNTER — Observation Stay (HOSPITAL_COMMUNITY): Payer: Medicare Other

## 2016-04-01 DIAGNOSIS — R748 Abnormal levels of other serum enzymes: Secondary | ICD-10-CM

## 2016-04-01 DIAGNOSIS — I480 Paroxysmal atrial fibrillation: Secondary | ICD-10-CM

## 2016-04-01 DIAGNOSIS — J189 Pneumonia, unspecified organism: Secondary | ICD-10-CM | POA: Diagnosis not present

## 2016-04-01 DIAGNOSIS — Z86718 Personal history of other venous thrombosis and embolism: Secondary | ICD-10-CM

## 2016-04-01 DIAGNOSIS — J42 Unspecified chronic bronchitis: Secondary | ICD-10-CM

## 2016-04-01 DIAGNOSIS — R7303 Prediabetes: Secondary | ICD-10-CM

## 2016-04-01 DIAGNOSIS — I5032 Chronic diastolic (congestive) heart failure: Secondary | ICD-10-CM | POA: Diagnosis not present

## 2016-04-01 DIAGNOSIS — N139 Obstructive and reflux uropathy, unspecified: Secondary | ICD-10-CM

## 2016-04-01 LAB — URINALYSIS, ROUTINE W REFLEX MICROSCOPIC
BILIRUBIN URINE: NEGATIVE
GLUCOSE, UA: NEGATIVE mg/dL
KETONES UR: NEGATIVE mg/dL
Nitrite: NEGATIVE
PROTEIN: NEGATIVE mg/dL
Specific Gravity, Urine: 1.025 (ref 1.005–1.030)
pH: 6 (ref 5.0–8.0)

## 2016-04-01 LAB — URINE MICROSCOPIC-ADD ON

## 2016-04-01 LAB — CBC WITH DIFFERENTIAL/PLATELET
BASOS ABS: 0 10*3/uL (ref 0.0–0.1)
Basophils Relative: 1 %
EOS PCT: 12 %
Eosinophils Absolute: 0.9 10*3/uL — ABNORMAL HIGH (ref 0.0–0.7)
HCT: 30.3 % — ABNORMAL LOW (ref 39.0–52.0)
HEMOGLOBIN: 9.2 g/dL — AB (ref 13.0–17.0)
LYMPHS ABS: 1.6 10*3/uL (ref 0.7–4.0)
LYMPHS PCT: 23 %
MCH: 27.1 pg (ref 26.0–34.0)
MCHC: 30.4 g/dL (ref 30.0–36.0)
MCV: 89.4 fL (ref 78.0–100.0)
Monocytes Absolute: 1.1 10*3/uL — ABNORMAL HIGH (ref 0.1–1.0)
Monocytes Relative: 15 %
NEUTROS PCT: 49 %
Neutro Abs: 3.5 10*3/uL (ref 1.7–7.7)
PLATELETS: 275 10*3/uL (ref 150–400)
RBC: 3.39 MIL/uL — AB (ref 4.22–5.81)
RDW: 18 % — ABNORMAL HIGH (ref 11.5–15.5)
WBC: 7 10*3/uL (ref 4.0–10.5)

## 2016-04-01 LAB — PROTIME-INR
INR: 3.22
PROTHROMBIN TIME: 33.6 s — AB (ref 11.4–15.2)

## 2016-04-01 LAB — BASIC METABOLIC PANEL
ANION GAP: 3 — AB (ref 5–15)
BUN: 19 mg/dL (ref 6–20)
CHLORIDE: 111 mmol/L (ref 101–111)
CO2: 26 mmol/L (ref 22–32)
Calcium: 8.3 mg/dL — ABNORMAL LOW (ref 8.9–10.3)
Creatinine, Ser: 0.89 mg/dL (ref 0.61–1.24)
GFR calc Af Amer: >60 mL/min (ref >60)
GLUCOSE: 111 mg/dL — AB (ref 65–99)
POTASSIUM: 3.9 mmol/L (ref 3.5–5.1)
SODIUM: 140 mmol/L (ref 135–145)

## 2016-04-01 LAB — STREP PNEUMONIAE URINARY ANTIGEN: STREP PNEUMO URINARY ANTIGEN: NEGATIVE

## 2016-04-01 MED ORDER — METOPROLOL TARTRATE 25 MG PO TABS: 25.0000 mg | ORAL_TABLET | Freq: Two times a day (BID) | ORAL | 0 refills | Status: AC

## 2016-04-01 MED ORDER — AMOXICILLIN-POT CLAVULANATE 875-125 MG PO TABS: 1.0000 | ORAL_TABLET | Freq: Two times a day (BID) | ORAL | Status: DC

## 2016-04-01 NOTE — Progress Notes (Signed)
Called report to [facility] nurse.

## 2016-04-01 NOTE — Care Management Obs Status (Signed)
MEDICARE OBSERVATION STATUS NOTIFICATION   Patient Details  Name: [name] MRN: [id number] Date of Birth: Aug 16, 1931   Medicare Observation Status Notification Given:  Yes    Juanelle Trueheart, Chrystine OilerSharley Diane, RN 04/10/2016, 9:30 AM

## 2016-04-01 NOTE — Care Management Note (Signed)
Case Management Note  Patient Details  Name: [name] MRN: [id number] Date of Birth: [birth year]    Expected Discharge Date:       04/02/2016           Expected Discharge Plan:  Skilled Nursing Facility  In-House Referral:  Clinical Social Work, NA  Discharge planning Services  CM Consult  Post Acute Care Choice:  NA Choice offered to:  NA  DME Arranged:    DME Agency:     HH Arranged:    HH Agency:     Status of Service:  Completed, signed off  If discussed at [employer] Meetings, dates discussed:    Additional Comments: Patient adm from SNF with HACP, AMS. He [facility] nursing [facility]. Receiving IV antibiotics. [facility]. CSW aware and working on arrangements for return. Will follow for CM needs.   [name], Joel OilerSharley Diane, RN 04/09/2016, 9:50 AM

## 2016-04-01 NOTE — Discharge Summary (Addendum)
Physician Discharge Summary  Waymond CeraBryce A Lich ZOX:[id number] DOB: 1932/01/12 DOA: 03/30/2016  PCP: No primary care provider on file.  Admit date: 03/30/2016 Discharge date: 04/09/2016  Admitted From: [facility] Disposition:  [facility]  Recommendations for Outpatient Follow-up:  1. Follow up with PCP in 1-2 weeks 2. Please obtain BMP/CBC in one week 3. Consider speech therapy evaluation 4. Metoprolol increased to 25mg  bid since patient was having bursts of tachycardia on telemetry  Home Health: Equipment/Devices:  Discharge Condition: stable CODE STATUS: DNR Diet recommendation: Heart Healthy / Carb Modified   Brief/Interim Summary: 89 yom with a PMH of AAA, COPD on home oxygen, Chronic diastolic CHF, AFib on coumadin, DVT, BPH with a chronic foley catheter, and dementia, presents from [facility] with altered mental status. Chest xray shows slightly worsening left basilar airspace disease. He was started on a nebulizer, vancomycin, and cefepime. Admitted for further evaluation of hospital acquired pneumonia. Wife stated that he generally does not have any evidence of aspiration normally.  Discharge Diagnoses:  Principal Problem:   HCAP (healthcare-associated pneumonia) Active Problems:   Peripheral vascular disease (HCC)   COPD (chronic obstructive pulmonary disease) (HCC)   History of DVT of lower extremity   Paroxysmal atrial fibrillation with rapid ventricular response (HCC)   Obstructive uropathy   Pre-diabetes   Chronic diastolic CHF (congestive heart failure) (HCC)   Elevated troponin   MRSA carrier  1. HCAP. Patient was afebrile on admission. Chest X-ray showed possible aspiration. Blood cultures showed no growth. He was initially treated with vancomycin and cefepime. This was later transitioned to augmentin. He is afebrile and has no leukocytosis. He can be considered for speech therapy evaluation at SNF. He does not have any evidence of overt aspiration at this time and  is tolerating his diet.  2. Paroxysmal atrial fibrillation with RVR. Patient has a CHADSVASc score of 6.  He is anticoagulated on coumadin, will continue. EKG shows atrial fibrillation. Continue metoprolol and Cardizem.  3. COPD. Continue supplemental oxygen, nebs, and albuterol.  4. Mildly elevated troponin. Minimal elevation. Likely demand ischemia. No further work up  5. Chronic diastolic congestive heart failure. Appears compensated at this time  6. Pre-diabetes. Last hemoglobin A1c was 6.3% in may 2017. Continue to monitor.  7. Hx of lower extremity DVT. Continue coumadin.  8. Obstructive uropathy. Patient has a chronic indwelling foley catheter.  9. Dementia.   Discharge Instructions  Discharge Instructions    Diet - low sodium heart healthy    Complete by:  As directed    Increase activity slowly    Complete by:  As directed        Medication List    TAKE these medications   acetaminophen 500 MG tablet Commonly known as:  TYLENOL Take 1,000 mg by mouth every 6 (six) hours as needed for mild pain or fever.   amoxicillin-clavulanate 875-125 MG tablet Commonly known as:  AUGMENTIN Take 1 tablet by mouth 2 (two) times daily. For 5 more days What changed:  additional instructions   diltiazem 120 MG 24 hr capsule Commonly known as:  CARDIZEM CD Take 120 mg by mouth every morning.   divalproex 250 MG DR tablet Commonly known as:  DEPAKOTE Take 250-500 mg by mouth at bedtime. 250mg  in the morning and 500mg  in the evening   doxazosin 8 MG tablet Commonly known as:  CARDURA Take 8 mg by mouth every morning.   feeding supplement (PRO-STAT SUGAR FREE 64) Liqd Take 30 mLs by mouth  2 (two) times daily between meals.   Fluticasone-Salmeterol 250-50 MCG/DOSE Aepb Commonly known as:  ADVAIR Inhale 1 puff into the lungs 2 (two) times daily.   ipratropium 0.02 % nebulizer solution Commonly known as:  ATROVENT Take 0.5 mg by nebulization daily. *May also give one vial via  nebulizer every 4 hours as needed for wheezing and/or shortness of breath   ipratropium-albuterol 0.5-2.5 (3) MG/3ML Soln Commonly known as:  DUONEB Take 3 mLs by nebulization 3 (three) times daily.   latanoprost 0.005 % ophthalmic solution Commonly known as:  XALATAN Place 1 drop into both eyes at bedtime.   methenamine 1 g tablet Commonly known as:  HIPREX Take 1 g by mouth 2 (two) times daily with a meal.   metoprolol tartrate 25 MG tablet Commonly known as:  LOPRESSOR Take 1 tablet (25 mg total) by mouth 2 (two) times daily. What changed:  how much to take   MILK OF MAGNESIA PO Give 30 ml by mouth daily as needed for constipation may use fleets enema   magnesium hydroxide 400 MG/5ML suspension Commonly known as:  MILK OF MAGNESIA Take 30 mLs by mouth daily as needed for mild constipation.   potassium chloride SA 20 MEQ tablet Commonly known as:  K-DUR,KLOR-CON Take 20 mEq by mouth daily.   SANTYL ointment Generic drug:  collagenase Apply 1 application topically daily.   sertraline 25 MG tablet Commonly known as:  ZOLOFT Take 75 mg by mouth daily. Take three tablets by mouth once daily   warfarin 4 MG tablet Commonly known as:  COUMADIN Take 4 mg by mouth every evening.       Allergies  Allergen Reactions  . Oxycodone Hcl Other (See Comments)     makes pt "wild"  . Propoxyphene N-Acetaminophen Other (See Comments)     Makes pt. "wild"    Consultations:     Procedures/Studies: Dg Chest 2 View  Result Date: 03/30/2016 CLINICAL DATA:  Altered mental status. EXAM: CHEST  2 VIEW COMPARISON:  02/17/2016 radiographs. FINDINGS: Suboptimal inspiration, especially on the lateral view. The heart size and mediastinal contours are stable. There is vascular congestion with increased patchy left lower lobe airspace disease. There may be a small amount of pleural fluid bilaterally. No evidence of pneumothorax. The bones appear unchanged. IMPRESSION: Slight worsening  of left basilar airspace disease, suspicious for aspiration. Electronically Signed   By: Carey BullocksWilliam  Veazey M.D.   On: 03/30/2016 15:03   Ct Head Wo Contrast  Result Date: 03/30/2016 CLINICAL DATA:  altered mental status. EXAM: CT HEAD WITHOUT CONTRAST TECHNIQUE: Contiguous axial images were obtained from the base of the skull through the vertex without intravenous contrast. COMPARISON:  None. FINDINGS: Brain: Prominence of the sulci and ventricles identified compatible with brain atrophy. There is diffuse low attenuation throughout the subcortical and periventricular white matter compatible with chronic microvascular disease. Infarcts within the left cerebellar hemisphere are stable from previous exam, image 10 of series 2. There is volume loss from the anterior left temporal lobe, image 12 of series 2. There is a 9 mm dural based hyperdense lesion along the right-sided the anterior falx, image 16 of series 2. This is favored to represent a benign meningioma. And unchanged from previous exam. No evidence for acute intracranial hemorrhage or infarct. Vascular: No hyperdense vessel or unexpected calcification. Skull: Normal. Negative for fracture or focal lesion. Sinuses/Orbits: No acute finding. Other: None. IMPRESSION: 1. No acute intracranial abnormalities. 2. Brain atrophy and chronic small vessel ischemic disease.  3. Chronic left cerebellar hemisphere infarcts. Electronically Signed   By: Signa Kell M.D.   On: 03/30/2016 14:53       Subjective: Patient is sleeping on arrival. Wakes up to voice. Denies complaints  Discharge Exam: Vitals:   03/31/16 2318 04/30/2016 1500  BP: (!) 141/77 (!) 100/39  Pulse: 100 62  Resp: 16 18  Temp:  98.2 F (36.8 C)   Vitals:   04/05/2016 0500 04/06/2016 0847 04/14/2016 1342 04/26/2016 1500  BP:    (!) 100/39  Pulse:    62  Resp:    18  Temp:    98.2 F (36.8 C)  TempSrc:    Oral  SpO2:  98% 96% 97%  Weight: 135.7 kg (299 lb 1 oz)     Height:         General: Pt is  awake, not in acute distress Cardiovascular: RRR, S1/S2 +, no rubs, no gallops Respiratory: CTA bilaterally, no wheezing, no rhonchi Abdominal: Soft, NT, ND, bowel sounds + Extremities: no edema, no cyanosis    The results of significant diagnostics from this hospitalization (including imaging, microbiology, ancillary and laboratory) are listed below for reference.     Microbiology: Recent Results (from the past 240 hour(s))  Culture, blood (routine x 2) Call MD if unable to obtain prior to antibiotics being given     Status: None (Preliminary result)   Collection Time: 03/30/16  4:31 PM  Result Value Ref Range Status   Specimen Description BLOOD RIGHT ARM  Final   Special Requests BOTTLES DRAWN AEROBIC AND ANAEROBIC 8CC  Final   Culture NO GROWTH 2 DAYS  Final   Report Status PENDING  Incomplete  Culture, blood (routine x 2) Call MD if unable to obtain prior to antibiotics being given     Status: None (Preliminary result)   Collection Time: 03/30/16  4:40 PM  Result Value Ref Range Status   Specimen Description BLOOD RIGHT ARM  Final   Special Requests BOTTLES DRAWN AEROBIC AND ANAEROBIC 8CC  Final   Culture NO GROWTH 2 DAYS  Final   Report Status PENDING  Incomplete  MRSA PCR Screening     Status: Abnormal   Collection Time: 03/30/16  6:55 PM  Result Value Ref Range Status   MRSA by PCR POSITIVE (A) NEGATIVE Final    Comment: RESULT CALLED TO, READ BACK BY AND VERIFIED WITH: TETREAULT,H AT 2100 ON 10.30.17 BY ISLEY,B        The GeneXpert MRSA Assay (FDA approved for NASAL specimens only), is one component of a comprehensive MRSA colonization surveillance program. It is not intended to diagnose MRSA infection nor to guide or monitor treatment for MRSA infections.   Urine culture     Status: None (Preliminary result)   Collection Time: 04/26/2016  1:17 AM  Result Value Ref Range Status   Specimen Description URINE, CLEAN CATCH  Final   Special  Requests NONE  Final   Culture PENDING  Incomplete   Report Status PENDING  Incomplete     Labs: BNP (last 3 results) No results for input(s): BNP in the last 8760 hours. Basic Metabolic Panel:  Recent Labs Lab 03/30/16 0700 03/30/16 1256 03/30/16 1640 03/31/16 0627 04/13/2016 0615  NA 142 139  --  141 140  K 4.0 4.2  --  3.8 3.9  CL 108 108  --  110 111  CO2 28 24  --  26 26  GLUCOSE 104* 123*  --  116* 111*  BUN 24* 24*  --  21* 19  CREATININE 0.94 0.97  --  0.88 0.89  CALCIUM 8.7* 8.7*  --  8.3* 8.3*  MG 2.1  --  2.0  --   --   PHOS  --   --  2.8  --   --    Liver Function Tests:  Recent Labs Lab 03/30/16 1256 03/31/16 0627  AST 19 12*  ALT 16* 13*  ALKPHOS 55 45  BILITOT 0.7 0.7  PROT 7.1 5.8*  ALBUMIN 3.1* 2.5*   No results for input(s): LIPASE, AMYLASE in the last 168 hours. No results for input(s): AMMONIA in the last 168 hours. CBC:  Recent Labs Lab 03/30/16 1256 03/31/16 0627 Apr 24, 2016 0615  WBC 9.8 6.1 7.0  NEUTROABS 6.4 2.7 3.5  HGB 11.4* 9.5* 9.2*  HCT 36.8* 31.5* 30.3*  MCV 89.3 89.2 89.4  PLT 304 262 275   Cardiac Enzymes:  Recent Labs Lab 03/30/16 1256 03/30/16 1640 03/31/16 0013  TROPONINI 0.04* 0.04* 0.03*   BNP: Invalid input(s): POCBNP CBG: No results for input(s): GLUCAP in the last 168 hours. D-Dimer No results for input(s): DDIMER in the last 72 hours. Hgb A1c No results for input(s): HGBA1C in the last 72 hours. Lipid Profile No results for input(s): CHOL, HDL, LDLCALC, TRIG, CHOLHDL, LDLDIRECT in the last 72 hours. Thyroid function studies No results for input(s): TSH, T4TOTAL, T3FREE, THYROIDAB in the last 72 hours.  Invalid input(s): FREET3 Anemia work up No results for input(s): VITAMINB12, FOLATE, FERRITIN, TIBC, IRON, RETICCTPCT in the last 72 hours. Urinalysis    Component Value Date/Time   COLORURINE YELLOW 2016/04/24 0117   APPEARANCEUR CLEAR April 24, 2016 0117   LABSPEC 1.025 24-Apr-2016 0117   PHURINE  6.0 24-Apr-2016 0117   GLUCOSEU NEGATIVE Apr 24, 2016 0117   HGBUR SMALL (A) 04-24-2016 0117   BILIRUBINUR NEGATIVE 04-24-2016 0117   BILIRUBINUR neg 11/27/2014 1133   KETONESUR NEGATIVE Apr 24, 2016 0117   PROTEINUR NEGATIVE 04-24-16 0117   UROBILINOGEN 1.0 04/09/2015 1225   NITRITE NEGATIVE 2016/04/24 0117   LEUKOCYTESUR SMALL (A) 04-24-2016 0117   Sepsis Labs Invalid input(s): PROCALCITONIN,  WBC,  LACTICIDVEN Microbiology Recent Results (from the past 240 hour(s))  Culture, blood (routine x 2) Call MD if unable to obtain prior to antibiotics being given     Status: None (Preliminary result)   Collection Time: 03/30/16  4:31 PM  Result Value Ref Range Status   Specimen Description BLOOD RIGHT ARM  Final   Special Requests BOTTLES DRAWN AEROBIC AND ANAEROBIC 8CC  Final   Culture NO GROWTH 2 DAYS  Final   Report Status PENDING  Incomplete  Culture, blood (routine x 2) Call MD if unable to obtain prior to antibiotics being given     Status: None (Preliminary result)   Collection Time: 03/30/16  4:40 PM  Result Value Ref Range Status   Specimen Description BLOOD RIGHT ARM  Final   Special Requests BOTTLES DRAWN AEROBIC AND ANAEROBIC 8CC  Final   Culture NO GROWTH 2 DAYS  Final   Report Status PENDING  Incomplete  MRSA PCR Screening     Status: Abnormal   Collection Time: 03/30/16  6:55 PM  Result Value Ref Range Status   MRSA by PCR POSITIVE (A) NEGATIVE Final    Comment: RESULT CALLED TO, READ BACK BY AND VERIFIED WITH: TETREAULT,H AT 2100 ON 10.30.17 BY ISLEY,B        The GeneXpert MRSA Assay (FDA approved for NASAL specimens only), is one  component of a comprehensive MRSA colonization surveillance program. It is not intended to diagnose MRSA infection nor to guide or monitor treatment for MRSA infections.   Urine culture     Status: None (Preliminary result)   Collection Time: 04-16-2016  1:17 AM  Result Value Ref Range Status   Specimen Description URINE, CLEAN CATCH   Final   Special Requests NONE  Final   Culture PENDING  Incomplete   Report Status PENDING  Incomplete     Time coordinating discharge: Over 30 minutes  SIGNED:   Erick Blinks, MD  [facility] Apr 16, 2016, 3:15 PM Pager   If 7PM-7AM, please contact night-coverage [email or web address] Password [email or web address]

## 2016-04-01 NOTE — Progress Notes (Signed)
ANTICOAGULATION CONSULT NOTE   Pharmacy Consult for coumadin Indication: atrial fibrillation  Allergies  Allergen Reactions  . Oxycodone Hcl Other (See Comments)     makes pt "wild"  . Propoxyphene N-Acetaminophen Other (See Comments)     Makes pt. "wild"    Patient Measurements: Height: 6\' 5"  (195.6 cm) Weight: 299 lb 1 oz (135.7 kg) IBW/kg (Calculated) : 89.1  Vital Signs: BP: 141/77 (10/31 2318) Pulse Rate: 100 (10/31 2318)  Labs:  Recent Labs  03/30/16 1256 03/30/16 1403 03/30/16 1640 03/31/16 0013 03/31/16 0627 2015/10/11 0615  HGB 11.4*  --   --   --  9.5* 9.2*  HCT 36.8*  --   --   --  31.5* 30.3*  PLT 304  --   --   --  262 275  LABPROT  --  27.0*  --   --  32.9* 33.6*  INR  --  2.45  --   --  3.13 3.22  CREATININE 0.97  --   --   --  0.88 0.89  TROPONINI 0.04*  --  0.04* 0.03*  --   --     Estimated Creatinine Clearance: 97.5 mL/min (by C-G formula based on SCr of 0.89 mg/dL).   Medical History: Past Medical History:  Diagnosis Date  . AAA (abdominal aortic aneurysm) (HCC)   . AAA (abdominal aortic aneurysm) without rupture (HCC) 10/06/2012  . Anticoagulation goal of INR 2 to 3 06/01/2013  . Arthritis    "probably on the right leg/hip" (09/20/2013)  . Atrial fibrillation (HCC)   . B12 deficiency 09/20/2013  . Benign localized hyperplasia of prostate with urinary retention 12/23/2014  . Bladder calculus 12/23/2014   2.6cm and present since at least 2011 when it was about 1.6cm.   [name]. BPH (benign prostatic hyperplasia)   . Bronchospasm   . Cardiomyopathy (HCC) 10/15/2014  . Cellulitis   . CHF (congestive heart failure) (HCC) 09/26/2013  . Chronic venous insufficiency 10/06/2012  . Cognitive impairment   . COPD (chronic obstructive pulmonary disease) (HCC)   . Daily headache   . Dementia    "don't know exactly what kind" (09/20/2013)  . DVT (deep venous thrombosis) (HCC) 05/04/2013   "LLE"  . E. coli UTI 11/17/15   Cipro X 7 days  . Edema-Bilat Leg and foot  03/07/2013  . Elevated PSA   . Fall at home September 18, 2013  . Glaucoma, both eyes   . Kidney stones   . Multifactorial dementia 10/06/2012  . Nicotine dependence   . Obesity, unspecified 10/06/2012  . Peripheral vascular disease (HCC) 09/20/2012  . Pyelonephritis, acute 12/22/2014  . Ulcer (HCC)   . Venous insufficiency   . Weakness 09/18/2013  . Wound cellulitis    let ankle wound size of half dollar [facility] on 03/25/2015    Medications:  See medication history  Assessment: 82 yo man to continue coumadin for afib.  INR is supratherapeutic at 3.22 this AM. Goal of Therapy:  INR 2-3 Monitor platelets by anticoagulation protocol: Yes   Plan:  No coumadin today Daily PT/INR Monitor for bleeding complications  Elder CyphersLorie Kessa Fairbairn, BS Loura BackPharm D, BCPS Clinical Pharmacist Pager [phone] 04/12/2016,9:28 AM

## 2016-04-01 NOTE — Care Management CC44 (Signed)
Condition Code 44 Documentation Completed  Patient Details  Name: [name] MRN: [id number] Date of Birth: February 11, 1932   Condition Code 44 given:   yes Patient signature on Condition Code 44 notice:   verbal signature, patient on contact precautions, signed by 2 CM's Documentation of 2 MD's agreement:   yes Code 44 added to claim:   yes    Elyas Villamor, Chrystine OilerSharley Diane, RN 04/30/2016, 9:30 AM

## 2016-04-01 NOTE — Clinical Social Work Note (Signed)
[facility] Pt's daughter, Alona BeneJoyce notified by voicemail. Facility aware and agreeable. Will transfer with staff.  Derenda FennelKara Sherisse Fullilove, LCSW [phone]

## 2016-04-01 NOTE — Progress Notes (Signed)
Pt has been refusing all medications other than IV antibiotics and refusing to have vitals taken.Educated patient on importance of allowing us to care for him and patient consistently refuses.

## 2016-04-02 ENCOUNTER — Non-Acute Institutional Stay (SKILLED_NURSING_FACILITY): Payer: Medicare Other | Admitting: Internal Medicine

## 2016-04-02 ENCOUNTER — Telehealth: Payer: Self-pay

## 2016-04-02 ENCOUNTER — Encounter: Payer: Self-pay | Admitting: Internal Medicine

## 2016-04-02 DIAGNOSIS — F0390 Unspecified dementia without behavioral disturbance: Secondary | ICD-10-CM

## 2016-04-02 DIAGNOSIS — N139 Obstructive and reflux uropathy, unspecified: Secondary | ICD-10-CM

## 2016-04-02 DIAGNOSIS — I87312 Chronic venous hypertension (idiopathic) with ulcer of left lower extremity: Secondary | ICD-10-CM | POA: Diagnosis not present

## 2016-04-02 DIAGNOSIS — J189 Pneumonia, unspecified organism: Secondary | ICD-10-CM | POA: Diagnosis not present

## 2016-04-02 DIAGNOSIS — F068 Other specified mental disorders due to known physiological condition: Secondary | ICD-10-CM

## 2016-04-02 DIAGNOSIS — I480 Paroxysmal atrial fibrillation: Secondary | ICD-10-CM

## 2016-04-02 DIAGNOSIS — I739 Peripheral vascular disease, unspecified: Secondary | ICD-10-CM | POA: Diagnosis not present

## 2016-04-02 DIAGNOSIS — J42 Unspecified chronic bronchitis: Secondary | ICD-10-CM

## 2016-04-02 DIAGNOSIS — I5032 Chronic diastolic (congestive) heart failure: Secondary | ICD-10-CM | POA: Diagnosis not present

## 2016-04-02 DIAGNOSIS — L97322 Non-pressure chronic ulcer of left ankle with fat layer exposed: Secondary | ICD-10-CM | POA: Diagnosis not present

## 2016-04-02 NOTE — Progress Notes (Signed)
Provider:  Einar Crow Location:   [facility] Room Number: 121/W Place of Service:  SNF (31)  PCP: No primary care provider on file. Patient Care Team: [name], MD as Attending Physician (Urology) [name], MD as Consulting Physician (Geriatric Medicine)  Extended Emergency Contact Information Primary Emergency Contact: [name],[name] Address: [address]          [place], [place] [address] [name] of [place] Home Phone: [phone] Mobile Phone: [phone] Relation: Daughter Secondary Emergency Contact: [facility] Address: [address]          [place], [place] [address] [name] of [place] Home Phone: [phone] Mobile Phone: [phone] Relation: Son  Code Status: DNR Goals of Care: Advanced Directive information Advanced Directives 04/02/2016  Does patient have an advance directive? Yes  Type of Advance Directive Out of facility DNR (pink MOST or yellow form)  Does patient want to make changes to advanced directive? No - Patient declined  Copy of advanced directive(s) in chart? Yes  Would patient like information on creating an advanced directive? -  Pre-existing out of facility DNR order (yellow form or pink MOST form) -      Chief Complaint  Patient presents with  . Readmit To SNF    HPI: Patient is a 82 y.o. male seen today for admission to SNF for LTC. Patient has h/o COPD, Atrial Fibrillation on chronic Coumadin, Chronic Lymphedema , Recurrent UTI with Chronic Foley Cathter, Dementia with Behavior issues.   Patient was having some cough and congestion and was found to have B/L Lungs infiltrate With Left more then Right Though to be secondary to Aspiration. He was started on PO Augmentin in Facility but patient Continued to show change in mental status and was send to hospital for further Evaluation.  In the hospital he was treated for the Pneumonia and COPD with Antibiotics  And is now discharged to the facility with PO  Augmentin. His Metoprolol Was increased as he was having some Problems with Rate control. Patient is doing better is more alert today. He does remember being in the hospital. Not very reliable with history but not having any cough or SOB. Stayed afebrile.  Past Medical History:  Diagnosis Date  . AAA (abdominal aortic aneurysm) (HCC)   . AAA (abdominal aortic aneurysm) without rupture (HCC) 10/06/2012  . Anticoagulation goal of INR 2 to 3 06/01/2013  . Arthritis    "probably on the right leg/hip" (09/20/2013)  . Atrial fibrillation (HCC)   . B12 deficiency 09/20/2013  . Benign localized hyperplasia of prostate with urinary retention 12/23/2014  . Bladder calculus 12/23/2014   2.6cm and present since at least 2011 when it was about 1.6cm.   [name] BPH (benign prostatic hyperplasia)   . Bronchospasm   . Cardiomyopathy (HCC) 10/15/2014  . Cellulitis   . CHF (congestive heart failure) (HCC) 09/26/2013  . Chronic venous insufficiency 10/06/2012  . Cognitive impairment   . COPD (chronic obstructive pulmonary disease) (HCC)   . Daily headache   . Dementia    "don't know exactly what kind" (09/20/2013)  . DVT (deep venous thrombosis) (HCC) 05/04/2013   "LLE"  . E. coli UTI 11/17/15   Cipro X 7 days  . Edema-Bilat Leg and foot 03/07/2013  . Elevated PSA   . Fall at home September 18, 2013  . Glaucoma, both eyes   . Kidney stones   . Multifactorial dementia 10/06/2012  . Nicotine dependence   . Obesity, unspecified 10/06/2012  .  Peripheral vascular disease (HCC) 09/20/2012  . Pyelonephritis, acute 12/22/2014  . Ulcer (HCC)   . Venous insufficiency   . Weakness 09/18/2013  . Wound cellulitis    let ankle wound size of half dollar [facility] 03/25/2015   Past Surgical History:  Procedure Laterality Date  . BACK SURGERY    . CATARACT EXTRACTION W/ INTRAOCULAR LENS  IMPLANT, BILATERAL Bilateral   . CHOLECYSTECTOMY  2011   Gall Bladder  . CYSTOSCOPY W/ STONE MANIPULATION  1970's    "once"  . CYSTOSCOPY WITH LITHOLAPAXY N/A 03/28/2015   Procedure: CYSTOSCOPY WITH LITHOLAPAXY;  Surgeon: Malen GauzePatrick L McKenzie, MD;  Location: [facility];  Service: Urology;  Laterality: N/A;  . EYE SURGERY    . FEMUR FRACTURE SURGERY Right 04/1985  . HIP FRACTURE SURGERY Right 2009  . INSITU PERCUTANEOUS PINNING FEMUR Right 1986  . POSTERIOR LAMINECTOMY / DECOMPRESSION LUMBAR SPINE  2004   Decompressive laminectomy unilaterally on the right at L4-5 with  . SPINE SURGERY    . TRANSURETHRAL RESECTION OF PROSTATE N/A 03/28/2015   Procedure: TRANSURETHRAL RESECTION OF THE PROSTATE ;  Surgeon: Malen GauzePatrick L McKenzie, MD;  Location: [facility];  Service: Urology;  Laterality: N/A;    reports that he quit smoking about 13 years ago. His smoking use included Cigarettes. He has a 108.00 pack-year smoking history. His smokeless tobacco use includes Snuff. He reports that he does not drink alcohol or use drugs. Social History   Social History  . Marital status: Married    Spouse name: N/A  . Number of children: N/A  . Years of education: N/A   Occupational History  . Not on file.   Social History Main Topics  . Smoking status: Former Smoker    Packs/day: 2.00    Years: 54.00    Types: Cigarettes    Quit date: 08/24/2002  . Smokeless tobacco: Current User    Types: Snuff     Comment: 09/20/2013 "dips snuff"  . Alcohol use No  . Drug use: No  . Sexual activity: Not Currently   Other Topics Concern  . Not on file   Social History Narrative  . No narrative on file    Functional Status Survey:    Family History  Problem Relation Age of Onset  . Aneurysm Brother   . Heart attack Brother     10-29-12  . Hypertension Brother   . Heart disease Brother     AAA  . Cancer Father     colon    Health Maintenance  Topic Date Due  . ZOSTAVAX  10/22/2016 (Originally 08/17/1993)  . TETANUS/TDAP  12/29/2016 (Originally 12/31/2006)  . PNA vac Low Risk Adult (2 of 2 - PCV13) 03/09/2017  . INFLUENZA  VACCINE  Completed    Allergies  Allergen Reactions  . Oxycodone Hcl Other (See Comments)     makes pt "wild"  . Propoxyphene N-Acetaminophen Other (See Comments)     Makes pt. "wild"      Medication List       Accurate as of 04/02/16  9:32 AM. Always use your most recent med list.          acetaminophen 500 MG tablet Commonly known as:  TYLENOL Take 1,000 mg by mouth every 6 (six) hours as needed for mild pain or fever.   amoxicillin-clavulanate 875-125 MG tablet Commonly known as:  AUGMENTIN Take 1 tablet by mouth 2 (two) times daily. For 5 more days  diltiazem 120 MG 24 hr capsule Commonly known as:  CARDIZEM CD Take 120 mg by mouth every morning.   divalproex 250 MG DR tablet Commonly known as:  DEPAKOTE Give 250 mg by mouth once a day. Give 500 mg by mouth at bedtime.   doxazosin 8 MG tablet Commonly known as:  CARDURA Take 8 mg by mouth every morning.   feeding supplement (PRO-STAT SUGAR FREE 64) Liqd Take 30 mLs by mouth 2 (two) times daily between meals.   Fluticasone-Salmeterol 250-50 MCG/DOSE Aepb Commonly known as:  ADVAIR Inhale 1 puff into the lungs 2 (two) times daily.   ipratropium 0.02 % nebulizer solution Commonly known as:  ATROVENT Give 1 neb tx inhalation three times a day. *May also give one vial via nebulizer every 4 hours as needed for wheezing and/or shortness of breath   latanoprost 0.005 % ophthalmic solution Commonly known as:  XALATAN Place 1 drop into both eyes at bedtime.   methenamine 1 g tablet Commonly known as:  HIPREX Take 1 g by mouth 2 (two) times daily with a meal.   metoprolol tartrate 25 MG tablet Commonly known as:  LOPRESSOR Take 1 tablet (25 mg total) by mouth 2 (two) times daily.   MILK OF MAGNESIA PO Give 30 ml by mouth daily as needed for constipation may use fleets enema   potassium chloride SA 20 MEQ tablet Commonly known as:  K-DUR,KLOR-CON Take 20 mEq by mouth daily.   SANTYL ointment Generic  drug:  collagenase Apply 1 application topically daily.   sertraline 25 MG tablet Commonly known as:  ZOLOFT Take 75 mg by mouth daily. Take three tablets by mouth once daily   VENELEX Oint Apply to sacrum  and bilateral buttocks qshift & prn for prevention every shift   warfarin 4 MG tablet Commonly known as:  COUMADIN Take 4 mg by mouth every evening.       Review of Systems  Unable to perform ROS: Dementia    Vitals:   04/02/16 0852  BP: 140/60  Pulse: 60  Resp: 20  Temp: (!) 96.8 F (36 C)  TempSrc: Oral  SpO2: 95%   There is no height or weight on file to calculate BMI. Physical Exam  Constitutional: He appears well-developed and well-nourished.  HENT:  Head: Normocephalic.  Mouth/Throat: Oropharynx is clear and moist.  Cardiovascular: Normal rate.  An irregularly irregular rhythm present.  Murmur heard. Pulmonary/Chest: Effort normal and breath sounds normal. No respiratory distress. He has no wheezes. He has no rales. He exhibits no tenderness.  Abdominal: Soft. Bowel sounds are normal. He exhibits no distension. There is no tenderness. There is no rebound and no guarding.  Musculoskeletal: He exhibits edema.  Bilateral with Chronic Venous stasis changes.  Neurological: He is alert.  Not oriented but Replies to simple questions.  Skin: Skin is warm. There is erythema.    Labs reviewed: Basic Metabolic Panel:  Recent Labs  16/10/96 0700 03/30/16 1256 03/30/16 1640 03/31/16 0627 04/11/2016 0615  NA 142 139  --  141 140  K 4.0 4.2  --  3.8 3.9  CL 108 108  --  110 111  CO2 28 24  --  26 26  GLUCOSE 104* 123*  --  116* 111*  BUN 24* 24*  --  21* 19  CREATININE 0.94 0.97  --  0.88 0.89  CALCIUM 8.7* 8.7*  --  8.3* 8.3*  MG 2.1  --  2.0  --   --  PHOS  --   --  2.8  --   --    Liver Function Tests:  Recent Labs  03/25/16 1240 03/30/16 1256 03/31/16 0627  AST 21 19 12*  ALT 13* 16* 13*  ALKPHOS 54 55 45  BILITOT 0.6 0.7 0.7  PROT 7.0 7.1  5.8*  ALBUMIN 3.1* 3.1* 2.5*   No results for input(s): LIPASE, AMYLASE in the last 8760 hours.  Recent Labs  03/25/16 1240  AMMONIA 19   CBC:  Recent Labs  03/30/16 1256 03/31/16 0627 Apr 08, 2016 0615  WBC 9.8 6.1 7.0  NEUTROABS 6.4 2.7 3.5  HGB 11.4* 9.5* 9.2*  HCT 36.8* 31.5* 30.3*  MCV 89.3 89.2 89.4  PLT 304 262 275   Cardiac Enzymes:  Recent Labs  03/30/16 1256 03/30/16 1640 03/31/16 0013  TROPONINI 0.04* 0.04* 0.03*   BNP: Invalid input(s): POCBNP Lab Results  Component Value Date   HGBA1C 6.3 (H) 10/24/2015   Lab Results  Component Value Date   TSH 1.960 09/22/2013   Lab Results  Component Value Date   VITAMINB12 440 09/16/2015   Lab Results  Component Value Date   FOLATE >20.0 10/06/2012   No results found for: IRON, TIBC, FERRITIN  Imaging and Procedures obtained prior to SNF admission: Dg Chest 2 View  Result Date: 03/30/2016 CLINICAL DATA:  Altered mental status. EXAM: CHEST  2 VIEW COMPARISON:  02/17/2016 radiographs. FINDINGS: Suboptimal inspiration, especially on the lateral view. The heart size and mediastinal contours are stable. There is vascular congestion with increased patchy left lower lobe airspace disease. There may be a small amount of pleural fluid bilaterally. No evidence of pneumothorax. The bones appear unchanged. IMPRESSION: Slight worsening of left basilar airspace disease, suspicious for aspiration. Electronically Signed   By: Carey Bullocks M.D.   On: 03/30/2016 15:03   Ct Head Wo Contrast  Result Date: 03/30/2016 CLINICAL DATA:  altered mental status. EXAM: CT HEAD WITHOUT CONTRAST TECHNIQUE: Contiguous axial images were obtained from the base of the skull through the vertex without intravenous contrast. COMPARISON:  None. FINDINGS: Brain: Prominence of the sulci and ventricles identified compatible with brain atrophy. There is diffuse low attenuation throughout the subcortical and periventricular white matter compatible  with chronic microvascular disease. Infarcts within the left cerebellar hemisphere are stable from previous exam, image 10 of series 2. There is volume loss from the anterior left temporal lobe, image 12 of series 2. There is a 9 mm dural based hyperdense lesion along the right-sided the anterior falx, image 16 of series 2. This is favored to represent a benign meningioma. And unchanged from previous exam. No evidence for acute intracranial hemorrhage or infarct. Vascular: No hyperdense vessel or unexpected calcification. Skull: Normal. Negative for fracture or focal lesion. Sinuses/Orbits: No acute finding. Other: None. IMPRESSION: 1. No acute intracranial abnormalities. 2. Brain atrophy and chronic small vessel ischemic disease. 3. Chronic left cerebellar hemisphere infarcts. Electronically Signed   By: Signa Kell M.D.   On: 03/30/2016 14:53    Assessment/Plan HCAP (healthcare-associated pneumonia)  Patient has had Pneumonia twice in past 2 months. And this time it seems more like aspiration on Imaging. Will Continue Augmentin for another 5 days to complete the course. Will also Do swallowing eval.  COPD Continue on his Nebs and Inhalers.  Paroxysmal atrial fibrillation with rapid ventricular response  Metoprolol was recently increased to control the rate. Patient is on Coumadin 4 mg. His Last INR was 3.22 Will hold Coumadin Repeat INR . Restart  only if it is less then 3  Anemia Hgb is 9.2. Which is close to his baseline. Don't see any Iron studies . LAST COLONOSCOPY done in 2015   Multifactorial dementia Continue same dose of Depakote.  Obstructive uropathy Chronic Foley Catheter.     Family/ staff Communication:   Labs/tests ordered:

## 2016-04-02 NOTE — Telephone Encounter (Signed)
Possible re-admission to facility  - [facility] Hospital fu is needed IF patient was re-admitted to facility upon discharge. Hospital discharge 04/04/2016

## 2016-04-03 ENCOUNTER — Encounter (HOSPITAL_COMMUNITY)
Admission: RE | Admit: 2016-04-03 | Discharge: 2016-04-03 | Disposition: A | Discharge status: Home / Self Care | Payer: Medicare Other | Source: Skilled Nursing Facility | Attending: Internal Medicine | Admitting: Internal Medicine

## 2016-04-03 DIAGNOSIS — F0391 Unspecified dementia with behavioral disturbance: Secondary | ICD-10-CM | POA: Insufficient documentation

## 2016-04-03 DIAGNOSIS — F39 Unspecified mood [affective] disorder: Secondary | ICD-10-CM | POA: Insufficient documentation

## 2016-04-03 DIAGNOSIS — J189 Pneumonia, unspecified organism: Secondary | ICD-10-CM | POA: Insufficient documentation

## 2016-04-03 DIAGNOSIS — I5032 Chronic diastolic (congestive) heart failure: Secondary | ICD-10-CM | POA: Diagnosis not present

## 2016-04-03 DIAGNOSIS — Z7901 Long term (current) use of anticoagulants: Secondary | ICD-10-CM | POA: Insufficient documentation

## 2016-04-03 LAB — BASIC METABOLIC PANEL
ANION GAP: 4 — AB (ref 5–15)
BUN: 18 mg/dL (ref 6–20)
CALCIUM: 8.7 mg/dL — AB (ref 8.9–10.3)
CO2: 29 mmol/L (ref 22–32)
CREATININE: 0.92 mg/dL (ref 0.61–1.24)
Chloride: 109 mmol/L (ref 101–111)
Glucose, Bld: 96 mg/dL (ref 65–99)
Potassium: 4.4 mmol/L (ref 3.5–5.1)
SODIUM: 142 mmol/L (ref 135–145)

## 2016-04-03 LAB — URINE CULTURE

## 2016-04-03 LAB — PROTIME-INR
INR: 1.8
PROTHROMBIN TIME: 21.1 s — AB (ref 11.4–15.2)

## 2016-04-03 LAB — LEGIONELLA PNEUMOPHILA SEROGP 1 UR AG: L. pneumophila Serogp 1 Ur Ag: NEGATIVE

## 2016-04-04 LAB — CULTURE, BLOOD (ROUTINE X 2)
CULTURE: NO GROWTH
Culture: NO GROWTH

## 2016-04-06 ENCOUNTER — Encounter (HOSPITAL_COMMUNITY)
Admission: RE | Admit: 2016-04-06 | Discharge: 2016-04-06 | Disposition: A | Discharge status: Home / Self Care | Payer: Medicare Other | Source: Skilled Nursing Facility | Attending: *Deleted | Admitting: *Deleted

## 2016-04-06 DIAGNOSIS — Z7901 Long term (current) use of anticoagulants: Secondary | ICD-10-CM | POA: Diagnosis not present

## 2016-04-06 DIAGNOSIS — I5032 Chronic diastolic (congestive) heart failure: Secondary | ICD-10-CM | POA: Diagnosis not present

## 2016-04-06 DIAGNOSIS — J189 Pneumonia, unspecified organism: Secondary | ICD-10-CM | POA: Diagnosis not present

## 2016-04-06 LAB — PROTIME-INR
INR: 1.4
Prothrombin Time: 17.3 seconds — ABNORMAL HIGH (ref 11.4–15.2)

## 2016-04-08 ENCOUNTER — Encounter (HOSPITAL_COMMUNITY)
Admission: RE | Admit: 2016-04-08 | Discharge: 2016-04-08 | Disposition: A | Discharge status: Home / Self Care | Payer: Medicare Other | Source: Skilled Nursing Facility | Attending: Internal Medicine | Admitting: Internal Medicine

## 2016-04-08 DIAGNOSIS — J189 Pneumonia, unspecified organism: Secondary | ICD-10-CM | POA: Diagnosis not present

## 2016-04-08 DIAGNOSIS — I5032 Chronic diastolic (congestive) heart failure: Secondary | ICD-10-CM | POA: Diagnosis not present

## 2016-04-08 DIAGNOSIS — Z7901 Long term (current) use of anticoagulants: Secondary | ICD-10-CM | POA: Diagnosis not present

## 2016-04-08 LAB — BASIC METABOLIC PANEL
ANION GAP: 3 — AB (ref 5–15)
BUN: 18 mg/dL (ref 6–20)
CALCIUM: 8.8 mg/dL — AB (ref 8.9–10.3)
CO2: 30 mmol/L (ref 22–32)
Chloride: 107 mmol/L (ref 101–111)
Creatinine, Ser: 0.85 mg/dL (ref 0.61–1.24)
GFR calc Af Amer: >60 mL/min (ref >60)
GLUCOSE: 96 mg/dL (ref 65–99)
Potassium: 4.4 mmol/L (ref 3.5–5.1)
Sodium: 140 mmol/L (ref 135–145)

## 2016-04-09 ENCOUNTER — Encounter (HOSPITAL_COMMUNITY)
Admission: RE | Admit: 2016-04-09 | Discharge: 2016-04-09 | Disposition: A | Discharge status: Home / Self Care | Payer: Medicare Other | Source: Skilled Nursing Facility | Attending: *Deleted | Admitting: *Deleted

## 2016-04-09 DIAGNOSIS — I5032 Chronic diastolic (congestive) heart failure: Secondary | ICD-10-CM | POA: Diagnosis not present

## 2016-04-09 DIAGNOSIS — J189 Pneumonia, unspecified organism: Secondary | ICD-10-CM | POA: Diagnosis not present

## 2016-04-09 DIAGNOSIS — L97822 Non-pressure chronic ulcer of other part of left lower leg with fat layer exposed: Secondary | ICD-10-CM | POA: Diagnosis not present

## 2016-04-09 DIAGNOSIS — Z7901 Long term (current) use of anticoagulants: Secondary | ICD-10-CM | POA: Diagnosis not present

## 2016-04-09 LAB — IRON AND TIBC
Iron: 32 ug/dL — ABNORMAL LOW (ref 45–182)
SATURATION RATIOS: 15 % — AB (ref 17.9–39.5)
TIBC: 220 ug/dL — AB (ref 250–450)
UIBC: 188 ug/dL

## 2016-04-09 LAB — FOLATE: Folate: 10.9 ng/mL (ref >5.9)

## 2016-04-09 LAB — CBC
HCT: 31.1 % — ABNORMAL LOW (ref 39.0–52.0)
Hemoglobin: 9.5 g/dL — ABNORMAL LOW (ref 13.0–17.0)
MCH: 27.3 pg (ref 26.0–34.0)
MCHC: 30.5 g/dL (ref 30.0–36.0)
MCV: 89.4 fL (ref 78.0–100.0)
PLATELETS: 222 10*3/uL (ref 150–400)
RBC: 3.48 MIL/uL — ABNORMAL LOW (ref 4.22–5.81)
RDW: 17.2 % — AB (ref 11.5–15.5)
WBC: 5.3 10*3/uL (ref 4.0–10.5)

## 2016-04-09 LAB — BASIC METABOLIC PANEL
ANION GAP: 5 (ref 5–15)
BUN: 18 mg/dL (ref 6–20)
CALCIUM: 8.6 mg/dL — AB (ref 8.9–10.3)
CO2: 27 mmol/L (ref 22–32)
CREATININE: 0.92 mg/dL (ref 0.61–1.24)
Chloride: 107 mmol/L (ref 101–111)
GLUCOSE: 88 mg/dL (ref 65–99)
Potassium: 3.7 mmol/L (ref 3.5–5.1)
Sodium: 139 mmol/L (ref 135–145)

## 2016-04-09 LAB — FERRITIN: FERRITIN: 55 ng/mL (ref 24–336)

## 2016-04-13 ENCOUNTER — Encounter (HOSPITAL_COMMUNITY)
Admission: RE | Admit: 2016-04-13 | Discharge: 2016-04-13 | Disposition: A | Discharge status: Home / Self Care | Payer: Medicare Other | Source: Skilled Nursing Facility | Attending: *Deleted | Admitting: *Deleted

## 2016-04-13 ENCOUNTER — Non-Acute Institutional Stay (SKILLED_NURSING_FACILITY): Payer: Medicare Other | Admitting: Internal Medicine

## 2016-04-13 ENCOUNTER — Encounter: Payer: Self-pay | Admitting: Internal Medicine

## 2016-04-13 DIAGNOSIS — I5032 Chronic diastolic (congestive) heart failure: Secondary | ICD-10-CM | POA: Diagnosis not present

## 2016-04-13 DIAGNOSIS — Z9189 Other specified personal risk factors, not elsewhere classified: Secondary | ICD-10-CM | POA: Diagnosis not present

## 2016-04-13 DIAGNOSIS — F068 Other specified mental disorders due to known physiological condition: Secondary | ICD-10-CM

## 2016-04-13 DIAGNOSIS — I714 Abdominal aortic aneurysm, without rupture, unspecified: Secondary | ICD-10-CM

## 2016-04-13 DIAGNOSIS — I480 Paroxysmal atrial fibrillation: Secondary | ICD-10-CM | POA: Diagnosis not present

## 2016-04-13 DIAGNOSIS — F0390 Unspecified dementia without behavioral disturbance: Secondary | ICD-10-CM

## 2016-04-13 DIAGNOSIS — D649 Anemia, unspecified: Secondary | ICD-10-CM | POA: Diagnosis not present

## 2016-04-13 DIAGNOSIS — J42 Unspecified chronic bronchitis: Secondary | ICD-10-CM

## 2016-04-13 DIAGNOSIS — J189 Pneumonia, unspecified organism: Secondary | ICD-10-CM | POA: Diagnosis not present

## 2016-04-13 DIAGNOSIS — Z7901 Long term (current) use of anticoagulants: Secondary | ICD-10-CM | POA: Diagnosis not present

## 2016-04-13 LAB — PROTIME-INR
INR: 1.67
Prothrombin Time: 19.9 seconds — ABNORMAL HIGH (ref 11.4–15.2)

## 2016-04-13 NOTE — Progress Notes (Signed)
Location:   [facility] Room Number: 121/W Place of Service:  SNF (31) Provider:  Gerrell Tabet,Niko Penson    Patient Care Team: Mahlon GammonAnjali L Creasie Lacosse, MD as PCP - General (Internal Medicine) Bjorn PippinJohn Wrenn, MD as Attending Physician (Urology)  Extended Emergency Contact Information Primary Emergency Contact: [name],[name] Address: [address]          [place], [place] [address] [place] Home Phone: [phone] Mobile Phone: [phone] Relation: Daughter Secondary Emergency Contact: [facility],[name] Address: [address]          [place], [place] [address] [place] Home Phone: [phone] Mobile Phone: [phone] Relation: Son  Code Status:  DNR Goals of care: Advanced Directive information Advanced Directives 04/13/2016  Does patient have an advance directive? Yes  Type of Advance Directive Out of facility DNR (pink MOST or yellow form)  Does patient want to make changes to advanced directive? No - Patient declined  Copy of advanced directive(s) in chart? Yes  Would patient like information on creating an advanced directive? -  Pre-existing out of facility DNR order (yellow form or pink MOST form) -     Chief Complaint  Patient presents with  . Medical Management of Chronic Issues    Routine Visit    HPI:  Pt is a 82 y.o. male seen today for medical management of chronic diseases.  And to discuss with his daughter about MOST form and comfort measures. Patient has h/o Atrial fibrillation on chronic Coumadin Therapy., COPD, DVT, Dementia with Behavior problems, BPH with chronic Foley Cathter, Recurrent UTI on chronic Hiprex,chronic venous stasis with Ulcers.  Since past routine visit patient has had 2episodes of Pneumonia and the last one needing admission in the hospital for B/L pneumonia. It is thought to be due to aspiration. Patient has had number of falls where he slides from the bed. He also continues to have behavior issues with  The staff.  His daughter said that he is also is having worsening memory and tremor in his hand. Patient is sitting in his wheel chair today. He is not very reliable but denies any complains today.   Past Medical History:  Diagnosis Date  . AAA (abdominal aortic aneurysm) (HCC)   . AAA (abdominal aortic aneurysm) without rupture (HCC) 10/06/2012  . Anticoagulation goal of INR 2 to 3 06/01/2013  . Arthritis    "probably on the right leg/hip" (09/20/2013)  . Atrial fibrillation (HCC)   . B12 deficiency 09/20/2013  . Benign localized hyperplasia of prostate with urinary retention 12/23/2014  . Bladder calculus 12/23/2014   2.6cm and present since at least 2011 when it was about 1.6cm.   [name]. BPH (benign prostatic hyperplasia)   . Bronchospasm   . Cardiomyopathy (HCC) 10/15/2014  . Cellulitis   . CHF (congestive heart failure) (HCC) 09/26/2013  . Chronic venous insufficiency 10/06/2012  . Cognitive impairment   . COPD (chronic obstructive pulmonary disease) (HCC)   . Daily headache   . Dementia    "don't know exactly what kind" (09/20/2013)  . DVT (deep venous thrombosis) (HCC) 05/04/2013   "LLE"  . E. coli UTI 11/17/15   Cipro X 7 days  . Edema-Bilat Leg and foot 03/07/2013  . Elevated PSA   . Fall at home September 18, 2013  . Glaucoma, both eyes   . Kidney stones   . Multifactorial dementia 10/06/2012  . Nicotine dependence   . Obesity, unspecified 10/06/2012  . Peripheral vascular disease (HCC) 09/20/2012  .  Pyelonephritis, acute 12/22/2014  . Ulcer (HCC)   . Venous insufficiency   . Weakness 09/18/2013  . Wound cellulitis    let ankle wound size of half dollar per nurse at [facility] on 03/25/2015   Past Surgical History:  Procedure Laterality Date  . BACK SURGERY    . CATARACT EXTRACTION W/ INTRAOCULAR LENS  IMPLANT, BILATERAL Bilateral   . CHOLECYSTECTOMY  2011   Gall Bladder  . CYSTOSCOPY W/ STONE MANIPULATION  1970's   "once"  . CYSTOSCOPY WITH LITHOLAPAXY N/A 03/28/2015   Procedure:  CYSTOSCOPY WITH LITHOLAPAXY;  Surgeon: Malen Gauze, MD;  Location: [facility];  Service: Urology;  Laterality: N/A;  . EYE SURGERY    . FEMUR FRACTURE SURGERY Right 04/1985  . HIP FRACTURE SURGERY Right 2009  . INSITU PERCUTANEOUS PINNING FEMUR Right 1986  . POSTERIOR LAMINECTOMY / DECOMPRESSION LUMBAR SPINE  2004   Decompressive laminectomy unilaterally on the right at L4-5 with  . SPINE SURGERY    . TRANSURETHRAL RESECTION OF PROSTATE N/A 03/28/2015   Procedure: TRANSURETHRAL RESECTION OF THE PROSTATE ;  Surgeon: Malen Gauze, MD;  Location: [facility];  Service: Urology;  Laterality: N/A;    Allergies  Allergen Reactions  . Oxycodone Hcl Other (See Comments)     makes pt "wild"  . Propoxyphene N-Acetaminophen Other (See Comments)     Makes pt. "wild"    Current Outpatient Prescriptions on File Prior to Visit  Medication Sig Dispense Refill  . acetaminophen (TYLENOL) 500 MG tablet Take 1,000 mg by mouth every 6 (six) hours as needed for mild pain or fever.     . Amino Acids-Protein Hydrolys (FEEDING SUPPLEMENT, PRO-STAT SUGAR FREE 64,) LIQD Take 30 mLs by mouth 2 (two) times daily between meals.    Lucilla Lame [place]-Castor Oil (VENELEX) OINT Apply to sacrum  and bilateral buttocks qshift & prn for prevention every shift    . collagenase (SANTYL) ointment Apply 1 application topically daily.    [name] diltiazem (CARDIZEM CD) 120 MG 24 hr capsule Take 120 mg by mouth every morning.     . divalproex (DEPAKOTE) 250 MG DR tablet Give 250 mg by mouth once a day. Give 500 mg by mouth at bedtime.    [name] doxazosin (CARDURA) 8 MG tablet Take 8 mg by mouth every morning.     . Fluticasone-Salmeterol (ADVAIR) 250-50 MCG/DOSE AEPB Inhale 1 puff into the lungs 2 (two) times daily.    [name] ipratropium (ATROVENT) 0.02 % nebulizer solution Give 1 neb tx inhalation three times a day. *May also give one vial via nebulizer every 4 hours as needed for wheezing and/or shortness of breath    . latanoprost (XALATAN)  0.005 % ophthalmic solution Place 1 drop into both eyes at bedtime.     . Magnesium Hydroxide (MILK OF MAGNESIA PO) Give 30 ml by mouth daily as needed for constipation may use fleets enema    . methenamine (HIPREX) 1 g tablet Take 1 g by mouth 2 (two) times daily with a meal.    . metoprolol tartrate (LOPRESSOR) 25 MG tablet Take 1 tablet (25 mg total) by mouth 2 (two) times daily. 30 tablet 0  . sertraline (ZOLOFT) 25 MG tablet Take 75 mg by mouth daily. Take three tablets by mouth once daily     No current facility-administered medications on file prior to visit.     Review of Systems  Reason unable to perform ROS: Pateint is not reliable due to his  dementia.    Immunization History  Administered Date(s) Administered  . Influenza,inj,Quad PF,36+ Mos 03/20/2013, 03/23/2014, 02/11/2015  . Influenza-Unspecified 03/05/2016  . PPD Test 12/26/2014, 01/09/2015  . Pneumococcal Polysaccharide-23 03/02/2007, 02/11/2015  . Pneumococcal-Unspecified 03/09/2016  . Td 12/30/1996   Pertinent  Health Maintenance Due  Topic Date Due  . PNA vac Low Risk Adult (2 of 2 - PCV13) 03/09/2017  . INFLUENZA VACCINE  Completed   Fall Risk  01/04/2015 04/18/2014 08/15/2013  Falls in the past year? Yes Yes Yes  Number falls in past yr: 2 or more 1 2 or more  Injury with Fall? No Yes -  Risk Factor Category  - High Fall Risk High Fall Risk  Risk for fall due to : Impaired balance/gait;Impaired mobility Impaired mobility;Mental status change History of fall(s);Impaired balance/gait;Impaired mobility;Mental status change   Functional Status Survey:    Vitals:   04/13/16 1615  BP: 130/60  Pulse: 83  Resp: 20  Temp: 97.8 F (36.6 C)  TempSrc: Oral  SpO2: 99%   There is no height or weight on file to calculate BMI. Physical Exam  Constitutional: He appears well-developed and well-nourished.  HENT:  Head: Normocephalic.  Mouth/Throat: Oropharynx is clear and moist.  Cardiovascular: Normal rate and  regular rhythm.   Murmur heard. Pulmonary/Chest: Effort normal and breath sounds normal. No respiratory distress. He has no wheezes. He has no rales.  Abdominal: Soft. Bowel sounds are normal. He exhibits no distension. There is no tenderness. There is no rebound.  Musculoskeletal: He exhibits edema.  Neurological: He is alert.  Skin: Skin is warm.  Psychiatric: He has a normal mood and affect. His behavior is normal.    Labs reviewed:  Recent Labs  03/30/16 0700  03/30/16 1640  04/03/16 0700 04/08/16 0500 04/09/16 0500  NA 142  < >  --   < > 142 140 139  K 4.0  < >  --   < > 4.4 4.4 3.7  CL 108  < >  --   < > 109 107 107  CO2 28  < >  --   < > 29 30 27   GLUCOSE 104*  < >  --   < > 96 96 88  BUN 24*  < >  --   < > 18 18 18   CREATININE 0.94  < >  --   < > 0.92 0.85 0.92  CALCIUM 8.7*  < >  --   < > 8.7* 8.8* 8.6*  MG 2.1  --  2.0  --   --   --   --   PHOS  --   --  2.8  --   --   --   --   < > = values in this interval not displayed.  Recent Labs  03/25/16 1240 03/30/16 1256 03/31/16 0627  AST 21 19 12*  ALT 13* 16* 13*  ALKPHOS 54 55 45  BILITOT 0.6 0.7 0.7  PROT 7.0 7.1 5.8*  ALBUMIN 3.1* 3.1* 2.5*    Recent Labs  03/30/16 1256 03/31/16 0627 10/29/2015 0615 04/09/16 0500  WBC 9.8 6.1 7.0 5.3  NEUTROABS 6.4 2.7 3.5  --   HGB 11.4* 9.5* 9.2* 9.5*  HCT 36.8* 31.5* 30.3* 31.1*  MCV 89.3 89.2 89.4 89.4  PLT 304 262 275 222   Lab Results  Component Value Date   TSH 1.960 09/22/2013   Lab Results  Component Value Date   HGBA1C 6.3 (H) 10/24/2015   Lab  Results  Component Value Date   CHOL 157 10/06/2012   HDL 34 (L) 10/06/2012   LDLCALC 96 10/06/2012   TRIG 135 10/06/2012   CHOLHDL 4.6 10/06/2012    Significant Diagnostic Results in last 30 days:  Dg Chest 2 View  Result Date: 03/30/2016 CLINICAL DATA:  Altered mental status. EXAM: CHEST  2 VIEW COMPARISON:  02/17/2016 radiographs. FINDINGS: Suboptimal inspiration, especially on the lateral view.  The heart size and mediastinal contours are stable. There is vascular congestion with increased patchy left lower lobe airspace disease. There may be a small amount of pleural fluid bilaterally. No evidence of pneumothorax. The bones appear unchanged. IMPRESSION: Slight worsening of left basilar airspace disease, suspicious for aspiration. Electronically Signed   By: Carey Bullocks M.D.   On: 03/30/2016 15:03   Ct Head Wo Contrast  Result Date: 03/30/2016 CLINICAL DATA:  altered mental status. EXAM: CT HEAD WITHOUT CONTRAST TECHNIQUE: Contiguous axial images were obtained from the base of the skull through the vertex without intravenous contrast. COMPARISON:  None. FINDINGS: Brain: Prominence of the sulci and ventricles identified compatible with brain atrophy. There is diffuse low attenuation throughout the subcortical and periventricular white matter compatible with chronic microvascular disease. Infarcts within the left cerebellar hemisphere are stable from previous exam, image 10 of series 2. There is volume loss from the anterior left temporal lobe, image 12 of series 2. There is a 9 mm dural based hyperdense lesion along the right-sided the anterior falx, image 16 of series 2. This is favored to represent a benign meningioma. And unchanged from previous exam. No evidence for acute intracranial hemorrhage or infarct. Vascular: No hyperdense vessel or unexpected calcification. Skull: Normal. Negative for fracture or focal lesion. Sinuses/Orbits: No acute finding. Other: None. IMPRESSION: 1. No acute intracranial abnormalities. 2. Brain atrophy and chronic small vessel ischemic disease. 3. Chronic left cerebellar hemisphere infarcts. Electronically Signed   By: Signa Kell M.D.   On: 03/30/2016 14:53    Assessment/Plan  Aspiration Risk.   Patient has had 2 pneumonia with last one suspicious for Aspiration. D/W the speech therapist. He was never evaluated by speech when he was in the hospital or  since then. They will do the eval today and see if need to modify the diet.  COPD Stable. Continue on Nebs.  Paroxysmal Atrial Fibrillation with Rapid ventricular response. Coumadin was adjusted again. Recently also his metoprolol increased. Rate control.  Anemia Hgb stable at 9.5 . Iron studies did show low ferritin though TIBC was normal. Will start on Iron supplement  H/O Aortic aneurysm Do not see the follow up US and since family does not want any thing aggressive will see if they want the follow up.  Dementia  Depakote dose was recently decreased by Psych.   Family/ staff Communication: Had long discussion with the daughter . Patient had made it clear when he was in sound mind that he does not want his life prolonged if  he cannot seem to be improving . He also did not ever want feeding tube. D/W daughter how we can make patient comfortable in the facility and not transfer him to the Hospital when he has infection. Patient is already DNR. The daughter is going to talk to his wife and if they agree to hold hospitalization if we can make patient comfortable here. The meeting was almost for an hour.  Labs/tests ordered:

## 2016-04-14 DIAGNOSIS — R1312 Dysphagia, oropharyngeal phase: Secondary | ICD-10-CM | POA: Diagnosis not present

## 2016-04-14 DIAGNOSIS — I5032 Chronic diastolic (congestive) heart failure: Secondary | ICD-10-CM | POA: Diagnosis not present

## 2016-04-15 DIAGNOSIS — I5032 Chronic diastolic (congestive) heart failure: Secondary | ICD-10-CM | POA: Diagnosis not present

## 2016-04-15 DIAGNOSIS — R1312 Dysphagia, oropharyngeal phase: Secondary | ICD-10-CM | POA: Diagnosis not present

## 2016-04-16 DIAGNOSIS — L97322 Non-pressure chronic ulcer of left ankle with fat layer exposed: Secondary | ICD-10-CM | POA: Diagnosis not present

## 2016-04-16 DIAGNOSIS — I872 Venous insufficiency (chronic) (peripheral): Secondary | ICD-10-CM | POA: Diagnosis not present

## 2016-04-16 DIAGNOSIS — I5032 Chronic diastolic (congestive) heart failure: Secondary | ICD-10-CM | POA: Diagnosis not present

## 2016-04-16 DIAGNOSIS — R1312 Dysphagia, oropharyngeal phase: Secondary | ICD-10-CM | POA: Diagnosis not present

## 2016-04-20 ENCOUNTER — Encounter (HOSPITAL_COMMUNITY)
Admission: RE | Admit: 2016-04-20 | Discharge: 2016-04-20 | Disposition: A | Discharge status: Home / Self Care | Payer: Medicare Other | Source: Skilled Nursing Facility | Attending: Internal Medicine | Admitting: Internal Medicine

## 2016-04-20 DIAGNOSIS — I5032 Chronic diastolic (congestive) heart failure: Secondary | ICD-10-CM | POA: Diagnosis not present

## 2016-04-20 DIAGNOSIS — Z7901 Long term (current) use of anticoagulants: Secondary | ICD-10-CM | POA: Diagnosis not present

## 2016-04-20 DIAGNOSIS — J189 Pneumonia, unspecified organism: Secondary | ICD-10-CM | POA: Diagnosis not present

## 2016-04-20 DIAGNOSIS — R1312 Dysphagia, oropharyngeal phase: Secondary | ICD-10-CM | POA: Diagnosis not present

## 2016-04-20 LAB — PROTIME-INR
INR: 1.65
PROTHROMBIN TIME: 19.7 s — AB (ref 11.4–15.2)

## 2016-04-21 DIAGNOSIS — I739 Peripheral vascular disease, unspecified: Secondary | ICD-10-CM | POA: Diagnosis not present

## 2016-04-21 DIAGNOSIS — I87312 Chronic venous hypertension (idiopathic) with ulcer of left lower extremity: Secondary | ICD-10-CM | POA: Diagnosis not present

## 2016-04-21 DIAGNOSIS — L97322 Non-pressure chronic ulcer of left ankle with fat layer exposed: Secondary | ICD-10-CM | POA: Diagnosis not present

## 2016-04-21 DIAGNOSIS — I5032 Chronic diastolic (congestive) heart failure: Secondary | ICD-10-CM | POA: Diagnosis not present

## 2016-04-21 DIAGNOSIS — R1312 Dysphagia, oropharyngeal phase: Secondary | ICD-10-CM | POA: Diagnosis not present

## 2016-04-22 DIAGNOSIS — R1312 Dysphagia, oropharyngeal phase: Secondary | ICD-10-CM | POA: Diagnosis not present

## 2016-04-22 DIAGNOSIS — I5032 Chronic diastolic (congestive) heart failure: Secondary | ICD-10-CM | POA: Diagnosis not present

## 2016-04-27 ENCOUNTER — Encounter (HOSPITAL_COMMUNITY)
Admission: RE | Admit: 2016-04-27 | Discharge: 2016-04-27 | Disposition: A | Discharge status: Home / Self Care | Payer: Medicare Other | Source: Skilled Nursing Facility | Attending: *Deleted | Admitting: *Deleted

## 2016-04-27 DIAGNOSIS — R1312 Dysphagia, oropharyngeal phase: Secondary | ICD-10-CM | POA: Diagnosis not present

## 2016-04-27 DIAGNOSIS — I5032 Chronic diastolic (congestive) heart failure: Secondary | ICD-10-CM | POA: Diagnosis not present

## 2016-04-27 DIAGNOSIS — Z7901 Long term (current) use of anticoagulants: Secondary | ICD-10-CM | POA: Diagnosis not present

## 2016-04-27 DIAGNOSIS — J189 Pneumonia, unspecified organism: Secondary | ICD-10-CM | POA: Diagnosis not present

## 2016-04-27 LAB — PROTIME-INR
INR: 1.95
PROTHROMBIN TIME: 22.5 s — AB (ref 11.4–15.2)

## 2016-04-30 ENCOUNTER — Encounter (HOSPITAL_COMMUNITY)
Admission: RE | Admit: 2016-04-30 | Discharge: 2016-04-30 | Disposition: A | Discharge status: Home / Self Care | Payer: Medicare Other | Source: Skilled Nursing Facility | Attending: *Deleted | Admitting: *Deleted

## 2016-04-30 DIAGNOSIS — I872 Venous insufficiency (chronic) (peripheral): Secondary | ICD-10-CM | POA: Diagnosis not present

## 2016-04-30 DIAGNOSIS — I5032 Chronic diastolic (congestive) heart failure: Secondary | ICD-10-CM | POA: Diagnosis not present

## 2016-04-30 DIAGNOSIS — J189 Pneumonia, unspecified organism: Secondary | ICD-10-CM | POA: Diagnosis not present

## 2016-04-30 DIAGNOSIS — I739 Peripheral vascular disease, unspecified: Secondary | ICD-10-CM | POA: Diagnosis not present

## 2016-04-30 DIAGNOSIS — L97322 Non-pressure chronic ulcer of left ankle with fat layer exposed: Secondary | ICD-10-CM | POA: Diagnosis not present

## 2016-04-30 DIAGNOSIS — Z7901 Long term (current) use of anticoagulants: Secondary | ICD-10-CM | POA: Diagnosis not present

## 2016-04-30 LAB — PROTIME-INR
INR: 2.1
Prothrombin Time: 23.9 seconds — ABNORMAL HIGH (ref 11.4–15.2)

## 2016-05-01 DEATH — deceased

## 2016-05-06 ENCOUNTER — Encounter: Payer: Self-pay | Admitting: Internal Medicine

## 2016-05-06 ENCOUNTER — Non-Acute Institutional Stay (SKILLED_NURSING_FACILITY): Payer: Medicare Other | Admitting: Internal Medicine

## 2016-05-06 DIAGNOSIS — J42 Unspecified chronic bronchitis: Secondary | ICD-10-CM

## 2016-05-06 DIAGNOSIS — R059 Cough, unspecified: Secondary | ICD-10-CM

## 2016-05-06 DIAGNOSIS — F068 Other specified mental disorders due to known physiological condition: Secondary | ICD-10-CM | POA: Diagnosis not present

## 2016-05-06 DIAGNOSIS — R0989 Other specified symptoms and signs involving the circulatory and respiratory systems: Secondary | ICD-10-CM | POA: Diagnosis not present

## 2016-05-06 DIAGNOSIS — I480 Paroxysmal atrial fibrillation: Secondary | ICD-10-CM

## 2016-05-06 DIAGNOSIS — Z8701 Personal history of pneumonia (recurrent): Secondary | ICD-10-CM

## 2016-05-06 DIAGNOSIS — F0390 Unspecified dementia without behavioral disturbance: Secondary | ICD-10-CM

## 2016-05-06 DIAGNOSIS — R05 Cough: Secondary | ICD-10-CM

## 2016-05-06 NOTE — Progress Notes (Signed)
Location:   [facility] Room Number: 121/W Place of Service:  SNF [phone] Provider:  Edmon CrapeArlo Mays  Joel GammonAnjali L Gupta, MD  Patient Care Team: Joel GammonAnjali L Gupta, MD as PCP - General (Internal Medicine) Joel PippinJohn Wrenn, MD as Attending Physician (Urology)  Extended Emergency Contact Information Primary Emergency Contact: [name] Address: [address]          [place], [place] [address] [place] Home Phone: [phone] Mobile Phone: [phone] Relation: Daughter Secondary Emergency Contact: [facility]care[facility],[name] Address: [address]          [place], [place] [address] [place] Home Phone: [phone] Mobile Phone: [phone] Relation: Son  Code Status:  DNR Goals of care: Advanced Directive information Advanced Directives 05/06/2016  Does Patient Have a Medical Advance Directive? Yes  Type of Advance Directive Out of facility DNR (pink MOST or yellow form)  Does patient want to make changes to medical advance directive? No - Patient declined  Copy of Healthcare Power of Attorney in Chart? -  Would patient like information on creating a medical advance directive? -  Pre-existing out of facility DNR order (yellow form or pink MOST form) -     Chief Complaint  Patient presents with  . Acute Visit    Cough and Congestion    HPI:  Pt is a 82 y.o. male seen today for an acute visit for Increased cough and congestion.  He does have a significant respiratory history  with a history of pneumonia which has required hospitalization in the past-he does have a history of COPD.  Speech has looked at him for aspiration --- but he continues on regular diet  Patient has had some increased cough and congestion here the last day or so. Vital signs are stable O2 saturation is 96% on room air.  He does have scheduled nebulizers     Past Medical History:  Diagnosis Date  . AAA (abdominal aortic aneurysm) (HCC)   . AAA (abdominal aortic aneurysm)  without rupture (HCC) 10/06/2012  . Anticoagulation goal of INR 2 to 3 06/01/2013  . Arthritis    "probably on the right leg/hip" (09/20/2013)  . Atrial fibrillation (HCC)   . B12 deficiency 09/20/2013  . Benign localized hyperplasia of prostate with urinary retention 12/23/2014  . Bladder calculus 12/23/2014   2.6cm and present since at least 2011 when it was about 1.6cm.   [name]. BPH (benign prostatic hyperplasia)   . Bronchospasm   . Cardiomyopathy (HCC) 10/15/2014  . Cellulitis   . CHF (congestive heart failure) (HCC) 09/26/2013  . Chronic venous insufficiency 10/06/2012  . Cognitive impairment   . COPD (chronic obstructive pulmonary disease) (HCC)   . Daily headache   . Dementia    "don't know exactly what kind" (09/20/2013)  . DVT (deep venous thrombosis) (HCC) 05/04/2013   "LLE"  . E. coli UTI 11/17/15   Cipro X 7 days  . Edema-Bilat Leg and foot 03/07/2013  . Elevated PSA   . Fall at home September 18, 2013  . Glaucoma, both eyes   . Kidney stones   . Multifactorial dementia 10/06/2012  . Nicotine dependence   . Obesity, unspecified 10/06/2012  . Peripheral vascular disease (HCC) 09/20/2012  . Pyelonephritis, acute 12/22/2014  . Ulcer (HCC)   . Venous insufficiency   . Weakness 09/18/2013  . Wound cellulitis    let ankle wound size of half dollar [facility] on 03/25/2015   Past Surgical History:  Procedure Laterality Date  . BACK SURGERY    . CATARACT EXTRACTION W/ INTRAOCULAR LENS  IMPLANT, BILATERAL Bilateral   . CHOLECYSTECTOMY  2011   Gall Bladder  . CYSTOSCOPY W/ STONE MANIPULATION  1970's   "once"  . CYSTOSCOPY WITH LITHOLAPAXY N/A 03/28/2015   Procedure: CYSTOSCOPY WITH LITHOLAPAXY;  Surgeon: Malen Gauze, MD;  Location: [facility];  Service: Urology;  Laterality: N/A;  . EYE SURGERY    . FEMUR FRACTURE SURGERY Right 04/1985  . HIP FRACTURE SURGERY Right 2009  . INSITU PERCUTANEOUS PINNING FEMUR Right 1986  . POSTERIOR LAMINECTOMY / DECOMPRESSION LUMBAR  SPINE  2004   Decompressive laminectomy unilaterally on the right at L4-5 with  . SPINE SURGERY    . TRANSURETHRAL RESECTION OF PROSTATE N/A 03/28/2015   Procedure: TRANSURETHRAL RESECTION OF THE PROSTATE ;  Surgeon: Malen Gauze, MD;  Location: [facility];  Service: Urology;  Laterality: N/A;    Allergies  Allergen Reactions  . Oxycodone Hcl Other (See Comments)     makes pt "wild"  . Propoxyphene N-Acetaminophen Other (See Comments)     Makes pt. "wild"    Current Outpatient Prescriptions on File Prior to Visit  Medication Sig Dispense Refill  . acetaminophen (TYLENOL) 500 MG tablet Take 1,000 mg by mouth every 6 (six) hours as needed for mild pain or fever.     . Amino Acids-Protein Hydrolys (FEEDING SUPPLEMENT, PRO-STAT SUGAR FREE 64,) LIQD Take 30 mLs by mouth 2 (two) times daily between meals.    Lucilla Lame [place]-Castor Oil (VENELEX) OINT Apply to sacrum  and bilateral buttocks qshift & prn for prevention every shift    . diltiazem (CARDIZEM CD) 120 MG 24 hr capsule Take 120 mg by mouth every morning.     . divalproex (DEPAKOTE) 250 MG DR tablet Give 250 mg by mouth once a day. Give 500 mg by mouth at bedtime.    [name] doxazosin (CARDURA) 8 MG tablet Take 8 mg by mouth every morning.     . Fluticasone-Salmeterol (ADVAIR) 250-50 MCG/DOSE AEPB Inhale 1 puff into the lungs 2 (two) times daily.    [name] ipratropium (ATROVENT) 0.02 % nebulizer solution Give 1 neb tx 3 ml  inhalation three times a day. *May also give one vial 0.5 mg via nebulizer every 4 hours as needed for wheezing and/or shortness of breath    . latanoprost (XALATAN) 0.005 % ophthalmic solution Place 1 drop into both eyes at bedtime.     . Magnesium Hydroxide (MILK OF MAGNESIA PO) Give 30 ml by mouth daily as needed for constipation may use fleets enema    . methenamine (HIPREX) 1 g tablet Take 1 g by mouth 2 (two) times daily with a meal.    . metoprolol tartrate (LOPRESSOR) 25 MG tablet Take 1 tablet (25 mg total) by mouth 2  (two) times daily. 30 tablet 0  . sertraline (ZOLOFT) 25 MG tablet Take 75 mg by mouth daily. Take three tablets by mouth once daily    . warfarin (COUMADIN) 1 MG tablet Take 0.5 mg along with 3 mg by mouth daily to equal 3.5 mg  On Sun,Sat,    . warfarin (COUMADIN) 3 MG tablet Take 3 mg along with 0.5 mg to equal 3.5 mg daily On Sun,Sat     No current facility-administered medications on file prior to visit.     Review of Systems   This is limited secondary to dementia but he says he does not have any  increase shortness of breath-his wife is at bedside however he says he has had some increased cough.  Appears his appetite is not great today but he is not complaining of any abdominal discomfort difficulty swallowing Continues to lose weight Immunization History  Administered Date(s) Administered  . Influenza,inj,Quad PF,36+ Mos 03/20/2013, 03/23/2014, 02/11/2015  . Influenza-Unspecified 03/05/2016  . PPD Test 12/26/2014, 01/09/2015  . Pneumococcal Polysaccharide-23 03/02/2007, 02/11/2015  . Pneumococcal-Unspecified 03/09/2016  . Td 12/30/1996   Pertinent  Health Maintenance Due  Topic Date Due  . PNA vac Low Risk Adult (2 of 2 - PCV13) 03/09/2017  . INFLUENZA VACCINE  Completed   Fall Risk  01/04/2015 04/18/2014 08/15/2013  Falls in the past year? Yes Yes Yes  Number falls in past yr: 2 or more 1 2 or more  Injury with Fall? No Yes -  Risk Factor Category  - High Fall Risk High Fall Risk  Risk for fall due to : Impaired balance/gait;Impaired mobility Impaired mobility;Mental status change History of fall(s);Impaired balance/gait;Impaired mobility;Mental status change   Functional Status Survey:    Vitals:   05/06/16 1228  BP: 136/64  Pulse: 60  Resp: 20  Temp: 98.2 F (36.8 C)  TempSrc: Oral  Weight is 279.3  Physical Exam  In general this is a somewhat obese elderly male in no distress  Sitting comfortably in his wheelchair  His skin is warm and dry he does have  some bruising most apparent arms bilaterally This appears to be chronicthere is no diaphoresis      He has venous stasis changes his left lower leg right lower leg both legs are currently wrapped  Eyes sclera and conjunctiva clear visual acuity appears grossly intact. Somewhat difficult exam since any attempt to examine he shuts  his eyes tightly  Oropharynx is clear mucous membranes moist.  Chest no sign of distress or labored breathing--did has some diffuse congestion there is no use of accessory muscles--congestion did clear somewhat with cough  Heart is regular rate and rhythm with an occasional irregular beat he has again venous stasis changes lower extremities bilaterally.-Has a 2/6 systolic murmur-heart sounds were distant rate appear to be in the 80's  Abdomen is obese soft nontender with positive bowel sounds   GU he does have a Foley catheter drainingamber colored urine.  Musculoskeletal appears able to move all extremities 4 grip strength appears intact bilaterally-he had a bit of trouble following verbal commands but appear to be able to move his lower extremities   .  Neurologic appear to be grossly intact no lateralizing findings he does have lower extremity weakness    Has fairly persistent tremors now.  Psych he is oriented to self was pleasant and cooperative with exam      Labs reviewed:  Recent Labs  03/30/16 0700  03/30/16 1640  04/03/16 0700 04/08/16 0500 04/09/16 0500  NA 142  < >  --   < > 142 140 139  K 4.0  < >  --   < > 4.4 4.4 3.7  CL 108  < >  --   < > 109 107 107  CO2 28  < >  --   < > 29 30 27   GLUCOSE 104*  < >  --   < > 96 96 88  BUN 24*  < >  --   < > 18 18 18   CREATININE 0.94  < >  --   < > 0.92 0.85 0.92  CALCIUM 8.7*  < >  --   < >  8.7* 8.8* 8.6*  MG 2.1  --  2.0  --   --   --   --   PHOS  --   --  2.8  --   --   --   --   < > = values in this interval not displayed.  Recent Labs  03/25/16 1240  03/30/16 1256 03/31/16 0627  AST 21 19 12*  ALT 13* 16* 13*  ALKPHOS 54 55 45  BILITOT 0.6 0.7 0.7  PROT 7.0 7.1 5.8*  ALBUMIN 3.1* 3.1* 2.5*    Recent Labs  03/30/16 1256 03/31/16 0627 Apr 29, 2016 0615 04/09/16 0500  WBC 9.8 6.1 7.0 5.3  NEUTROABS 6.4 2.7 3.5  --   HGB 11.4* 9.5* 9.2* 9.5*  HCT 36.8* 31.5* 30.3* 31.1*  MCV 89.3 89.2 89.4 89.4  PLT 304 262 275 222   Lab Results  Component Value Date   TSH 1.960 09/22/2013   Lab Results  Component Value Date   HGBA1C 6.3 (H) 10/24/2015   Lab Results  Component Value Date   CHOL 157 10/06/2012   HDL 34 (L) 10/06/2012   LDLCALC 96 10/06/2012   TRIG 135 10/06/2012   CHOLHDL 4.6 10/06/2012    Significant Diagnostic Results in last 30 days:  No results found.  Assessment/Plan  #1 cough with congestion patient is at risk here with his significant history of respiratory issues we will start him. Augmentin 500 mg twice a day-for 10 days also Mucinex 600 mg twice a day for 7 days-he does have routine nebulizers which should be continued also has every 4 hours orders for nebulizers as well when necessary.  Also will order a 2 view chest x-ray-monitor closely vital signs pulse ox every shift for now.  #2 history of atrial fibrillation this appears rate controlled on metoprolol and Cardizem-he is on Coumadin for anticoagulation. Last INR was 2.1 on 04/30/2016 update INR is pending will have to keep a close eye on this since he is now on an antibiotic.  #3 history COPD again he is on routine nebulizers additions made as noted above secondary to cough and congestion. -year-old also on Advair Diskus twice a day.    #4 history of anemia he is on iron most recent hemoglobin was 9.5 will have this updated.  #5-history of DVT again he is on Coumadin and INR is therapeutic update INR is pending.  History of dementia with behaviors-apparently this has improved somewhat he is followed by psychiatric services Depakote was recently  reduced.  #7 history of BPH-he has been followed by urology he does have a chronic indwelling Foley catheter this is stable he does have a history of times of UTIs with apparently chronic MRSA colonization he does not appear to be symptomatic currently.  #8 history of depression he does continue on Zoloft-again appears his behaviors  recently have stabilized Will have to monitor  #9-history of chronic diastolic CHF-he is on a beta blocker he is no longer on diuretic nonetheless his weight appears to be decreasing edema appears to be stabilized he does have wrapping of his lower legs at this point will monitor also will await chest x-ray results  Of note Will update CBC BMP for updated values-.  [id number] note greater than 35 minutes spent assessing patient-reviewing his chart reviewing his labs discuss his status with nursing staff and coordinating and formulating a plan of care for numerous diagnoses-of note greater than 50% of time spent coordinating plan of care  Oralia Manis, [place]

## 2016-05-07 ENCOUNTER — Encounter (HOSPITAL_COMMUNITY)
Admission: AD | Admit: 2016-05-07 | Discharge: 2016-05-07 | Disposition: A | Discharge status: Home / Self Care | Payer: Medicare Other | Source: Skilled Nursing Facility | Attending: Internal Medicine | Admitting: Internal Medicine

## 2016-05-07 DIAGNOSIS — I482 Chronic atrial fibrillation: Secondary | ICD-10-CM | POA: Insufficient documentation

## 2016-05-07 DIAGNOSIS — I5032 Chronic diastolic (congestive) heart failure: Secondary | ICD-10-CM | POA: Diagnosis not present

## 2016-05-07 DIAGNOSIS — F0391 Unspecified dementia with behavioral disturbance: Secondary | ICD-10-CM | POA: Insufficient documentation

## 2016-05-07 DIAGNOSIS — I739 Peripheral vascular disease, unspecified: Secondary | ICD-10-CM | POA: Diagnosis not present

## 2016-05-07 DIAGNOSIS — J189 Pneumonia, unspecified organism: Secondary | ICD-10-CM | POA: Insufficient documentation

## 2016-05-07 DIAGNOSIS — I872 Venous insufficiency (chronic) (peripheral): Secondary | ICD-10-CM | POA: Diagnosis not present

## 2016-05-07 DIAGNOSIS — Z7901 Long term (current) use of anticoagulants: Secondary | ICD-10-CM | POA: Diagnosis not present

## 2016-05-07 DIAGNOSIS — R293 Abnormal posture: Secondary | ICD-10-CM | POA: Diagnosis not present

## 2016-05-07 DIAGNOSIS — L97322 Non-pressure chronic ulcer of left ankle with fat layer exposed: Secondary | ICD-10-CM | POA: Diagnosis not present

## 2016-05-07 LAB — BASIC METABOLIC PANEL
Anion gap: 6 (ref 5–15)
BUN: 22 mg/dL — ABNORMAL HIGH (ref 6–20)
CALCIUM: 8.8 mg/dL — AB (ref 8.9–10.3)
CO2: 29 mmol/L (ref 22–32)
CREATININE: 1.04 mg/dL (ref 0.61–1.24)
Chloride: 109 mmol/L (ref 101–111)
GFR calc Af Amer: >60 mL/min (ref >60)
GFR calc non Af Amer: >60 mL/min (ref >60)
GLUCOSE: 94 mg/dL (ref 65–99)
Potassium: 3.8 mmol/L (ref 3.5–5.1)
Sodium: 144 mmol/L (ref 135–145)

## 2016-05-07 LAB — CBC
HEMATOCRIT: 33.8 % — AB (ref 39.0–52.0)
Hemoglobin: 10.2 g/dL — ABNORMAL LOW (ref 13.0–17.0)
MCH: 26.9 pg (ref 26.0–34.0)
MCHC: 30.2 g/dL (ref 30.0–36.0)
MCV: 89.2 fL (ref 78.0–100.0)
Platelets: 281 10*3/uL (ref 150–400)
RBC: 3.79 MIL/uL — ABNORMAL LOW (ref 4.22–5.81)
RDW: 17.2 % — AB (ref 11.5–15.5)
WBC: 6.7 10*3/uL (ref 4.0–10.5)

## 2016-05-07 LAB — PROTIME-INR
INR: 3.06
Prothrombin Time: 32.3 seconds — ABNORMAL HIGH (ref 11.4–15.2)

## 2016-05-07 NOTE — Progress Notes (Signed)
This encounter was created in error - please disregard.

## 2016-05-08 ENCOUNTER — Encounter (HOSPITAL_COMMUNITY)
Admission: RE | Admit: 2016-05-08 | Discharge: 2016-05-08 | Disposition: A | Discharge status: Home / Self Care | Payer: Medicare Other | Source: Skilled Nursing Facility | Attending: Internal Medicine | Admitting: Internal Medicine

## 2016-05-08 DIAGNOSIS — J189 Pneumonia, unspecified organism: Secondary | ICD-10-CM | POA: Diagnosis not present

## 2016-05-08 DIAGNOSIS — Z7901 Long term (current) use of anticoagulants: Secondary | ICD-10-CM | POA: Insufficient documentation

## 2016-05-08 DIAGNOSIS — I5032 Chronic diastolic (congestive) heart failure: Secondary | ICD-10-CM | POA: Insufficient documentation

## 2016-05-08 DIAGNOSIS — I482 Chronic atrial fibrillation: Secondary | ICD-10-CM | POA: Insufficient documentation

## 2016-05-08 DIAGNOSIS — R293 Abnormal posture: Secondary | ICD-10-CM | POA: Insufficient documentation

## 2016-05-08 LAB — PROTIME-INR
INR: 3.11
PROTHROMBIN TIME: 32.7 s — AB (ref 11.4–15.2)

## 2016-05-09 ENCOUNTER — Encounter (HOSPITAL_COMMUNITY)
Admission: RE | Admit: 2016-05-09 | Discharge: 2016-05-09 | Disposition: A | Discharge status: Home / Self Care | Payer: Medicare Other | Source: Skilled Nursing Facility | Attending: Pediatrics | Admitting: Pediatrics

## 2016-05-09 DIAGNOSIS — J189 Pneumonia, unspecified organism: Secondary | ICD-10-CM | POA: Diagnosis not present

## 2016-05-09 DIAGNOSIS — I482 Chronic atrial fibrillation: Secondary | ICD-10-CM | POA: Diagnosis not present

## 2016-05-09 DIAGNOSIS — R293 Abnormal posture: Secondary | ICD-10-CM | POA: Diagnosis not present

## 2016-05-09 DIAGNOSIS — I5032 Chronic diastolic (congestive) heart failure: Secondary | ICD-10-CM | POA: Diagnosis not present

## 2016-05-09 DIAGNOSIS — Z7901 Long term (current) use of anticoagulants: Secondary | ICD-10-CM | POA: Diagnosis not present

## 2016-05-09 LAB — PROTIME-INR
INR: 2.54
PROTHROMBIN TIME: 27.8 s — AB (ref 11.4–15.2)

## 2016-05-10 ENCOUNTER — Encounter (HOSPITAL_COMMUNITY)
Admission: RE | Admit: 2016-05-10 | Discharge: 2016-05-10 | Disposition: A | Discharge status: Home / Self Care | Payer: Medicare Other

## 2016-05-10 DIAGNOSIS — Z7901 Long term (current) use of anticoagulants: Secondary | ICD-10-CM | POA: Diagnosis not present

## 2016-05-10 DIAGNOSIS — I5032 Chronic diastolic (congestive) heart failure: Secondary | ICD-10-CM | POA: Diagnosis not present

## 2016-05-10 DIAGNOSIS — I482 Chronic atrial fibrillation: Secondary | ICD-10-CM | POA: Diagnosis not present

## 2016-05-10 DIAGNOSIS — R293 Abnormal posture: Secondary | ICD-10-CM | POA: Diagnosis not present

## 2016-05-10 DIAGNOSIS — J189 Pneumonia, unspecified organism: Secondary | ICD-10-CM | POA: Diagnosis not present

## 2016-05-10 LAB — PROTIME-INR
INR: 2.27
Prothrombin Time: 25.4 seconds — ABNORMAL HIGH (ref 11.4–15.2)

## 2016-05-11 ENCOUNTER — Encounter (HOSPITAL_COMMUNITY)
Admission: RE | Admit: 2016-05-11 | Discharge: 2016-05-11 | Disposition: A | Discharge status: Home / Self Care | Payer: Medicare Other | Source: Skilled Nursing Facility | Attending: Internal Medicine | Admitting: Internal Medicine

## 2016-05-11 DIAGNOSIS — R293 Abnormal posture: Secondary | ICD-10-CM | POA: Diagnosis not present

## 2016-05-11 DIAGNOSIS — Z7901 Long term (current) use of anticoagulants: Secondary | ICD-10-CM | POA: Diagnosis not present

## 2016-05-11 DIAGNOSIS — I5032 Chronic diastolic (congestive) heart failure: Secondary | ICD-10-CM | POA: Insufficient documentation

## 2016-05-11 DIAGNOSIS — F0391 Unspecified dementia with behavioral disturbance: Secondary | ICD-10-CM | POA: Insufficient documentation

## 2016-05-11 DIAGNOSIS — J189 Pneumonia, unspecified organism: Secondary | ICD-10-CM | POA: Diagnosis not present

## 2016-05-11 LAB — PROTIME-INR
INR: 2.22
PROTHROMBIN TIME: 25 s — AB (ref 11.4–15.2)

## 2016-05-13 ENCOUNTER — Encounter: Payer: Self-pay | Admitting: Internal Medicine

## 2016-05-13 ENCOUNTER — Non-Acute Institutional Stay (SKILLED_NURSING_FACILITY): Payer: Medicare Other | Admitting: Internal Medicine

## 2016-05-13 DIAGNOSIS — R0989 Other specified symptoms and signs involving the circulatory and respiratory systems: Secondary | ICD-10-CM | POA: Diagnosis not present

## 2016-05-13 DIAGNOSIS — R05 Cough: Secondary | ICD-10-CM

## 2016-05-13 DIAGNOSIS — R634 Abnormal weight loss: Secondary | ICD-10-CM

## 2016-05-13 DIAGNOSIS — R059 Cough, unspecified: Secondary | ICD-10-CM

## 2016-05-13 NOTE — Progress Notes (Signed)
Location:   [facility] Room Number: 121/W Place of Service:  SNF [phone](31) Provider:  Edmon CrapeArlo Moxon Messler  Mahlon GammonAnjali L Gupta, MD  Patient Care Team: Mahlon GammonAnjali L Gupta, MD as PCP - General (Internal Medicine) Bjorn PippinJohn Wrenn, MD as Attending Physician (Urology)  Extended Emergency Contact Information Primary Emergency Contact: [name],[name] Address: [address]          [place], [place] [address] [place] Home Phone: [phone] Mobile Phone: [phone] Relation: Daughter Secondary Emergency Contact: [name] Address: [address]          [place], [place] [address] [place] Home Phone: [phone] Mobile Phone: [phone] Relation: Son  Code Status:  DNR Goals of care: Advanced Directive information Advanced Directives 05/13/2016  Does Patient Have a Medical Advance Directive? Yes  Type of Advance Directive Out of facility DNR (pink MOST or yellow form)  Does patient want to make changes to medical advance directive? No - Patient declined  Copy of Healthcare Power of Attorney in Chart? -  Would patient like information on creating a medical advance directive? -  Pre-existing out of facility DNR order (yellow form or pink MOST form) -     Chief Complaint  Patient presents with  . Acute Visit    Con. Productive Cough    HPI:  Pt is a 82 y.o. male seen today for an acute visit for Follow-up of cough-he was started on Augmentin M7 days ago for a cough-according nursing he still has some chest congestion his O2 stats are satisfactory in the 90s vital signs are stable he is afebrile.  Chest x-ray was done last week which did not show any acute process.  He does continue on nebulizers 3 times a day as well as when necessary every 4 hours as well as Mucinex which is scheduled to be completed today.      Past Medical History:  Diagnosis Date  . AAA (abdominal aortic aneurysm) (HCC)   . AAA (abdominal aortic aneurysm) without rupture  (HCC) 10/06/2012  . Anticoagulation goal of INR 2 to 3 06/01/2013  . Arthritis    "probably on the right leg/hip" (09/20/2013)  . Atrial fibrillation (HCC)   . B12 deficiency 09/20/2013  . Benign localized hyperplasia of prostate with urinary retention 12/23/2014  . Bladder calculus 12/23/2014   2.6cm and present since at least 2011 when it was about 1.6cm.   [name]. BPH (benign prostatic hyperplasia)   . Bronchospasm   . Cardiomyopathy (HCC) 10/15/2014  . Cellulitis   . CHF (congestive heart failure) (HCC) 09/26/2013  . Chronic venous insufficiency 10/06/2012  . Cognitive impairment   . COPD (chronic obstructive pulmonary disease) (HCC)   . Daily headache   . Dementia    "don't know exactly what kind" (09/20/2013)  . DVT (deep venous thrombosis) (HCC) 05/04/2013   "LLE"  . E. coli UTI 11/17/15   Cipro X 7 days  . Edema-Bilat Leg and foot 03/07/2013  . Elevated PSA   . Fall at home September 18, 2013  . Glaucoma, both eyes   . Kidney stones   . Multifactorial dementia 10/06/2012  . Nicotine dependence   . Obesity, unspecified 10/06/2012  . Peripheral vascular disease (HCC) 09/20/2012  . Pyelonephritis, acute 12/22/2014  . Ulcer (HCC)   . Venous insufficiency   . Weakness 09/18/2013  . Wound cellulitis    let ankle wound size of half dollar [facility] on 03/25/2015   Past Surgical  History:  Procedure Laterality Date  . BACK SURGERY    . CATARACT EXTRACTION W/ INTRAOCULAR LENS  IMPLANT, BILATERAL Bilateral   . CHOLECYSTECTOMY  2011   Gall Bladder  . CYSTOSCOPY W/ STONE MANIPULATION  1970's   "once"  . CYSTOSCOPY WITH LITHOLAPAXY N/A 03/28/2015   Procedure: CYSTOSCOPY WITH LITHOLAPAXY;  Surgeon: Malen Gauze, MD;  Location: [facility];  Service: Urology;  Laterality: N/A;  . EYE SURGERY    . FEMUR FRACTURE SURGERY Right 04/1985  . HIP FRACTURE SURGERY Right 2009  . INSITU PERCUTANEOUS PINNING FEMUR Right 1986  . POSTERIOR LAMINECTOMY / DECOMPRESSION LUMBAR SPINE  2004    Decompressive laminectomy unilaterally on the right at L4-5 with  . SPINE SURGERY    . TRANSURETHRAL RESECTION OF PROSTATE N/A 03/28/2015   Procedure: TRANSURETHRAL RESECTION OF THE PROSTATE ;  Surgeon: Malen Gauze, MD;  Location: [facility];  Service: Urology;  Laterality: N/A;    Allergies  Allergen Reactions  . Oxycodone Hcl Other (See Comments)     makes pt "wild"  . Propoxyphene N-Acetaminophen Other (See Comments)     Makes pt. "wild"    Current Outpatient Prescriptions on File Prior to Visit  Medication Sig Dispense Refill  . acetaminophen (TYLENOL) 500 MG tablet Take 1,000 mg by mouth every 6 (six) hours as needed for mild pain or fever.     . Amino Acids-Protein Hydrolys (FEEDING SUPPLEMENT, PRO-STAT SUGAR FREE 64,) LIQD Take 30 mLs by mouth 2 (two) times daily between meals.    [name] diltiazem (CARDIZEM CD) 120 MG 24 hr capsule Take 120 mg by mouth every morning.     . divalproex (DEPAKOTE) 250 MG DR tablet Give 250 mg by mouth once a day. Give 500 mg by mouth at bedtime.    [name] doxazosin (CARDURA) 8 MG tablet Take 8 mg by mouth every morning.     . ferrous sulfate (IRON SUPPLEMENT) 325 (65 FE) MG tablet Take 325 mg by mouth daily with breakfast.    . Fluticasone-Salmeterol (ADVAIR) 250-50 MCG/DOSE AEPB Inhale 1 puff into the lungs 2 (two) times daily.    [name] ipratropium (ATROVENT) 0.02 % nebulizer solution Give 1 neb tx 3 ml  inhalation three times a day. *May also give one vial 0.5 mg via nebulizer every 4 hours as needed for wheezing and/or shortness of breath    . latanoprost (XALATAN) 0.005 % ophthalmic solution Place 1 drop into both eyes at bedtime.     . Magnesium Hydroxide (MILK OF MAGNESIA PO) Give 30 ml by mouth daily as needed for constipation may use fleets enema    . methenamine (HIPREX) 1 g tablet Take 1 g by mouth 2 (two) times daily with a meal.    . metoprolol tartrate (LOPRESSOR) 25 MG tablet Take 1 tablet (25 mg total) by mouth 2 (two) times daily. 30 tablet 0  .  sertraline (ZOLOFT) 25 MG tablet Take 75 mg by mouth daily. Take three tablets by mouth once daily    . warfarin (COUMADIN) 1 MG tablet Take 0.5 mg along with 3 mg by mouth daily to equal 3.5 mg  On Mon,Tue,Wed,Thu,Fri    . warfarin (COUMADIN) 3 MG tablet Take 3 mg along with 0.5 mg to equal 3.5 mg daily On Mon,Tue,Wed,Thu,Fri    . warfarin (COUMADIN) 4 MG tablet Once a day on Sun,Sat give 4 mg at 5:00 PM     No current facility-administered medications on file prior to visit.  Review of Systems  This is difficult to obtain since patient is a poor historian with some element of dementia-however when asked he denies shortness of breath says he feels okay thinks he eats well although's nursing staff reports his appetite has decreased and appears he's lost about 20 pounds over the past month  Immunization History  Administered Date(s) Administered  . Influenza,inj,Quad PF,36+ Mos 03/20/2013, 03/23/2014, 02/11/2015  . Influenza-Unspecified 03/05/2016  . PPD Test 12/26/2014, 01/09/2015  . Pneumococcal Polysaccharide-23 03/02/2007, 02/11/2015  . Pneumococcal-Unspecified 03/09/2016  . Td 12/30/1996   Pertinent  Health Maintenance Due  Topic Date Due  . PNA vac Low Risk Adult (2 of 2 - PCV13) 03/09/2017  . INFLUENZA VACCINE  Completed   Fall Risk  01/04/2015 04/18/2014 08/15/2013  Falls in the past year? Yes Yes Yes  Number falls in past yr: 2 or more 1 2 or more  Injury with Fall? No Yes -  Risk Factor Category  - High Fall Risk High Fall Risk  Risk for fall due to : Impaired balance/gait;Impaired mobility Impaired mobility;Mental status change History of fall(s);Impaired balance/gait;Impaired mobility;Mental status change   Functional Status Survey:    Vitals:   05/13/16 1537  BP: (!) 133/59  Pulse: (!) 59  Resp: 20  Temp: 98.2 F (36.8 C)  TempSrc: Oral  SpO2: 94%   Weight is 274.9 pounds Physical Exam In general this is a somewhat obese elderly male in no distress  He is  lying in bed and appears to be frail but in no distress  His skin is warm and dry he does have some bruising most apparent arms bilaterally This appears to be chronicthere is no diaphoresis      He has venous stasis changes his left lower leg right lower leg both legs are currently wrapped  Eyes sclera and conjunctiva clear visual acuity appears grossly intact.Somewhat difficult exam since any attempt to examine he shuts his eyes tightly  Oropharynx is clear mucous membranes slightly dry.  Chest no sign of distress or labored breathing--did has some diffuse congestion there is no use of accessory muscles--this is fairly comparable with last week's exam  Heart is regular rate and rhythm with an occasional irregular beat he has again venous stasis changes lower extremities bilaterally.-Has a 2/6 systolic murmur-heart sounds were distant rate appear to be in the 70's  Abdomen is obese soft nontender with positive bowel sounds GU he does have a Foley catheter drainingamber colored urine.  Musculoskeletal appearsable to move all extremities 4 grip strength appears intact bilaterally With baseline lower extremity weakness   .  Neurologic appear to be grossly intact no lateralizing findings he does have lower extremity weakness    Has fairly persistent tremors of his upper extremities.  Psych he is oriented to self was pleasant and cooperative with exam--wondered what all the fuss was about with me examining him  Labs reviewed:  Recent Labs  03/30/16 0700  03/30/16 1640  04/08/16 0500 04/09/16 0500 05/07/16 0715  NA 142  < >  --   < > 140 139 144  K 4.0  < >  --   < > 4.4 3.7 3.8  CL 108  < >  --   < > 107 107 109  CO2 28  < >  --   < > 30 27 29   GLUCOSE 104*  < >  --   < > 96 88 94  BUN 24*  < >  --   < >  18 18 22*  CREATININE 0.94  < >  --   < > 0.85 0.92 1.04  CALCIUM 8.7*  < >  --   < > 8.8* 8.6* 8.8*  MG 2.1  --  2.0  --   --   --   --     PHOS  --   --  2.8  --   --   --   --   < > = values in this interval not displayed.  Recent Labs  03/25/16 1240 03/30/16 1256 03/31/16 0627  AST 21 19 12*  ALT 13* 16* 13*  ALKPHOS 54 55 45  BILITOT 0.6 0.7 0.7  PROT 7.0 7.1 5.8*  ALBUMIN 3.1* 3.1* 2.5*    Recent Labs  03/30/16 1256 03/31/16 0627 04/28/2016 0615 04/09/16 0500 05/07/16 0715  WBC 9.8 6.1 7.0 5.3 6.7  NEUTROABS 6.4 2.7 3.5  --   --   HGB 11.4* 9.5* 9.2* 9.5* 10.2*  HCT 36.8* 31.5* 30.3* 31.1* 33.8*  MCV 89.3 89.2 89.4 89.4 89.2  PLT 304 262 275 222 281   Lab Results  Component Value Date   TSH 1.960 09/22/2013   Lab Results  Component Value Date   HGBA1C 6.3 (H) 10/24/2015   Lab Results  Component Value Date   CHOL 157 10/06/2012   HDL 34 (L) 10/06/2012   LDLCALC 96 10/06/2012   TRIG 135 10/06/2012   CHOLHDL 4.6 10/06/2012    Significant Diagnostic Results in last 30 days:  No results found.  Assessment/Plan  #1 continued cough with congestion-at this point will continue the routine as well as when necessary nebulizers-we have ordered an updated chest x-ray which again did not show any acute process.  We will extend his Mucinex for 5 additional days-he continues on Augmentin as well as.  He appears to continue to be increasingly weak and declining with significant weight loss he has been started on supplementation  Dr. Chales AbrahamsGupta recently had a long discussion with patient's daughter-the patient has made it apparently cleared to family he does not want his life prolonged if he cannot seem to be improving.  Does not want a feeding tube.  Plans are to avoid hospitalization it if at all possible and put emphasis on comfort.  At this point he appears to be declining but not unstable-will update a CBC and BMP tomorrow-continue to encourage by mouth and fluid intake and I did discuss this with him as well as nursing-.  [id number].        London SheerLuster, Sally C, [place] [phone]

## 2016-05-14 ENCOUNTER — Encounter (HOSPITAL_COMMUNITY)
Admission: RE | Admit: 2016-05-14 | Discharge: 2016-05-14 | Disposition: A | Discharge status: Home / Self Care | Payer: Medicare Other | Source: Skilled Nursing Facility | Attending: Internal Medicine | Admitting: Internal Medicine

## 2016-05-14 DIAGNOSIS — I739 Peripheral vascular disease, unspecified: Secondary | ICD-10-CM | POA: Diagnosis not present

## 2016-05-14 DIAGNOSIS — R1312 Dysphagia, oropharyngeal phase: Secondary | ICD-10-CM | POA: Diagnosis not present

## 2016-05-14 DIAGNOSIS — Z7901 Long term (current) use of anticoagulants: Secondary | ICD-10-CM | POA: Diagnosis not present

## 2016-05-14 DIAGNOSIS — L97322 Non-pressure chronic ulcer of left ankle with fat layer exposed: Secondary | ICD-10-CM | POA: Diagnosis not present

## 2016-05-14 DIAGNOSIS — F0391 Unspecified dementia with behavioral disturbance: Secondary | ICD-10-CM | POA: Insufficient documentation

## 2016-05-14 DIAGNOSIS — I5032 Chronic diastolic (congestive) heart failure: Secondary | ICD-10-CM | POA: Insufficient documentation

## 2016-05-14 DIAGNOSIS — R293 Abnormal posture: Secondary | ICD-10-CM | POA: Insufficient documentation

## 2016-05-14 DIAGNOSIS — J449 Chronic obstructive pulmonary disease, unspecified: Secondary | ICD-10-CM | POA: Diagnosis not present

## 2016-05-14 DIAGNOSIS — J189 Pneumonia, unspecified organism: Secondary | ICD-10-CM | POA: Diagnosis not present

## 2016-05-14 DIAGNOSIS — I87312 Chronic venous hypertension (idiopathic) with ulcer of left lower extremity: Secondary | ICD-10-CM | POA: Diagnosis not present

## 2016-05-14 LAB — CBC WITH DIFFERENTIAL/PLATELET
Basophils Absolute: 0 10*3/uL (ref 0.0–0.1)
Basophils Relative: 0 %
Eosinophils Absolute: 1.3 10*3/uL — ABNORMAL HIGH (ref 0.0–0.7)
Eosinophils Relative: 15 %
HEMATOCRIT: 33.9 % — AB (ref 39.0–52.0)
HEMOGLOBIN: 10.4 g/dL — AB (ref 13.0–17.0)
LYMPHS ABS: 2.1 10*3/uL (ref 0.7–4.0)
Lymphocytes Relative: 23 %
MCH: 27.7 pg (ref 26.0–34.0)
MCHC: 30.7 g/dL (ref 30.0–36.0)
MCV: 90.2 fL (ref 78.0–100.0)
MONO ABS: 1 10*3/uL (ref 0.1–1.0)
MONOS PCT: 11 %
NEUTROS ABS: 4.7 10*3/uL (ref 1.7–7.7)
NEUTROS PCT: 51 %
Platelets: 254 10*3/uL (ref 150–400)
RBC: 3.76 MIL/uL — ABNORMAL LOW (ref 4.22–5.81)
RDW: 17.6 % — AB (ref 11.5–15.5)
WBC: 9.1 10*3/uL (ref 4.0–10.5)

## 2016-05-14 LAB — BASIC METABOLIC PANEL
Anion gap: 5 (ref 5–15)
BUN: 23 mg/dL — AB (ref 6–20)
CHLORIDE: 108 mmol/L (ref 101–111)
CO2: 28 mmol/L (ref 22–32)
CREATININE: 0.91 mg/dL (ref 0.61–1.24)
Calcium: 8.6 mg/dL — ABNORMAL LOW (ref 8.9–10.3)
GFR calc Af Amer: >60 mL/min (ref >60)
GFR calc non Af Amer: >60 mL/min (ref >60)
GLUCOSE: 102 mg/dL — AB (ref 65–99)
Potassium: 3.8 mmol/L (ref 3.5–5.1)
Sodium: 141 mmol/L (ref 135–145)

## 2016-05-18 ENCOUNTER — Encounter (HOSPITAL_COMMUNITY)
Admission: RE | Admit: 2016-05-18 | Discharge: 2016-05-18 | Disposition: A | Discharge status: Home / Self Care | Payer: Medicare Other | Source: Skilled Nursing Facility | Attending: Internal Medicine | Admitting: Internal Medicine

## 2016-05-18 DIAGNOSIS — F0391 Unspecified dementia with behavioral disturbance: Secondary | ICD-10-CM | POA: Diagnosis present

## 2016-05-18 DIAGNOSIS — J189 Pneumonia, unspecified organism: Secondary | ICD-10-CM | POA: Diagnosis not present

## 2016-05-18 DIAGNOSIS — I5032 Chronic diastolic (congestive) heart failure: Secondary | ICD-10-CM | POA: Diagnosis not present

## 2016-05-18 DIAGNOSIS — R293 Abnormal posture: Secondary | ICD-10-CM | POA: Insufficient documentation

## 2016-05-18 DIAGNOSIS — I482 Chronic atrial fibrillation: Secondary | ICD-10-CM | POA: Diagnosis not present

## 2016-05-18 DIAGNOSIS — Z7901 Long term (current) use of anticoagulants: Secondary | ICD-10-CM | POA: Insufficient documentation

## 2016-05-18 DIAGNOSIS — R1312 Dysphagia, oropharyngeal phase: Secondary | ICD-10-CM | POA: Diagnosis not present

## 2016-05-18 LAB — PROTIME-INR
INR: 1.83
Prothrombin Time: 21.4 seconds — ABNORMAL HIGH (ref 11.4–15.2)

## 2016-05-19 DIAGNOSIS — R1312 Dysphagia, oropharyngeal phase: Secondary | ICD-10-CM | POA: Diagnosis not present

## 2016-05-20 DIAGNOSIS — R1312 Dysphagia, oropharyngeal phase: Secondary | ICD-10-CM | POA: Diagnosis not present

## 2016-05-21 DIAGNOSIS — L97322 Non-pressure chronic ulcer of left ankle with fat layer exposed: Secondary | ICD-10-CM | POA: Diagnosis not present

## 2016-05-21 DIAGNOSIS — J449 Chronic obstructive pulmonary disease, unspecified: Secondary | ICD-10-CM | POA: Diagnosis not present

## 2016-05-21 DIAGNOSIS — R1312 Dysphagia, oropharyngeal phase: Secondary | ICD-10-CM | POA: Diagnosis not present

## 2016-05-21 DIAGNOSIS — I739 Peripheral vascular disease, unspecified: Secondary | ICD-10-CM | POA: Diagnosis not present

## 2016-05-21 DIAGNOSIS — I87312 Chronic venous hypertension (idiopathic) with ulcer of left lower extremity: Secondary | ICD-10-CM | POA: Diagnosis not present

## 2016-05-25 ENCOUNTER — Encounter (HOSPITAL_COMMUNITY)
Admission: RE | Admit: 2016-05-25 | Discharge: 2016-05-25 | Disposition: A | Discharge status: Home / Self Care | Payer: Medicare Other | Source: Skilled Nursing Facility | Attending: Internal Medicine | Admitting: Internal Medicine

## 2016-05-25 DIAGNOSIS — Z7901 Long term (current) use of anticoagulants: Secondary | ICD-10-CM | POA: Diagnosis not present

## 2016-05-25 DIAGNOSIS — R2231 Localized swelling, mass and lump, right upper limb: Secondary | ICD-10-CM | POA: Diagnosis not present

## 2016-05-25 DIAGNOSIS — I5032 Chronic diastolic (congestive) heart failure: Secondary | ICD-10-CM | POA: Diagnosis not present

## 2016-05-25 DIAGNOSIS — I482 Chronic atrial fibrillation: Secondary | ICD-10-CM | POA: Diagnosis not present

## 2016-05-25 DIAGNOSIS — J189 Pneumonia, unspecified organism: Secondary | ICD-10-CM | POA: Diagnosis not present

## 2016-05-25 DIAGNOSIS — R293 Abnormal posture: Secondary | ICD-10-CM | POA: Diagnosis not present

## 2016-05-25 LAB — PROTIME-INR
INR: 2.65
PROTHROMBIN TIME: 28.8 s — AB (ref 11.4–15.2)

## 2016-05-26 ENCOUNTER — Non-Acute Institutional Stay (SKILLED_NURSING_FACILITY): Payer: Medicare Other | Admitting: Internal Medicine

## 2016-05-26 DIAGNOSIS — R05 Cough: Secondary | ICD-10-CM

## 2016-05-26 DIAGNOSIS — R1312 Dysphagia, oropharyngeal phase: Secondary | ICD-10-CM | POA: Diagnosis not present

## 2016-05-26 DIAGNOSIS — I480 Paroxysmal atrial fibrillation: Secondary | ICD-10-CM | POA: Diagnosis not present

## 2016-05-26 DIAGNOSIS — R609 Edema, unspecified: Secondary | ICD-10-CM

## 2016-05-26 DIAGNOSIS — R059 Cough, unspecified: Secondary | ICD-10-CM

## 2016-05-26 DIAGNOSIS — Z7901 Long term (current) use of anticoagulants: Secondary | ICD-10-CM | POA: Diagnosis not present

## 2016-05-26 DIAGNOSIS — M810 Age-related osteoporosis without current pathological fracture: Secondary | ICD-10-CM | POA: Diagnosis not present

## 2016-05-27 ENCOUNTER — Encounter: Payer: Self-pay | Admitting: Internal Medicine

## 2016-05-27 DIAGNOSIS — R600 Localized edema: Secondary | ICD-10-CM | POA: Diagnosis not present

## 2016-05-27 DIAGNOSIS — R1312 Dysphagia, oropharyngeal phase: Secondary | ICD-10-CM | POA: Diagnosis not present

## 2016-05-28 DIAGNOSIS — R1312 Dysphagia, oropharyngeal phase: Secondary | ICD-10-CM | POA: Diagnosis not present

## 2016-05-28 NOTE — Progress Notes (Signed)
Location:   [facility] Room Number: 121/W Place of Service:  SNF [phone]) Provider:  Edmon CrapeArlo Lassen  Mahlon GammonAnjali L Gupta, MD  Patient Care Team: Mahlon GammonAnjali L Gupta, MD as PCP - General (Internal Medicine) Bjorn PippinJohn Wrenn, MD as Attending Physician (Urology)  Extended Emergency Contact Information Primary Emergency Contact: [name],[name] Address: [address]          [place], [place] [address] [place] Home Phone: [phone] Mobile Phone: [phone] Relation: Daughter Secondary Emergency Contact: [name] Address: [address]          [place], [place] [address] [place] Home Phone: [phone] Mobile Phone: [phone] Relation: Son  Code Status: DNR Goals of care: Advanced Directive information Advanced Directives 05/27/2016  Does Patient Have a Medical Advance Directive? Yes  Type of Advance Directive Out of facility DNR (pink MOST or yellow form)  Does patient want to make changes to medical advance directive? No - Patient declined  Copy of Healthcare Power of Attorney in Chart? -  Would patient like information on creating a medical advance directive? -  Pre-existing out of facility DNR order (yellow form or pink MOST form) -     Chief Complaint  Patient presents with  . Acute Visit    Right Arm Edema  Also anticoagulation management with history of atrial fibrillation.  HPI:  Pt is a 82 y.o. male seen today for an acute visit for Right arm edema noted by staff.  He is not really complaining of pain with this-at times she does have what appears to be dependent edema apparently at one point it had left arm edema which resolved unremarkably.  He does have a history of significant lower extremity venous stasis and currently has his legs wrapped.  Also was seen recently for a cough he does have a history COPD-recent chest x-rays have not really shown any acute process he did complete a course of Augmentin recently-he is on routine  nebulizers as well as when necessary he is also on Advair.  Per nursing cough appears to be doing somewhat better he is no longer spitting up sputum he is not complaining of shortness of breath or discomfort although he is a somewhat poor historian and family actually has opted for somewhat more comfort care status with no aggressive hospitalization or interventions  He does have a history of atrial fibrillation he is on Coumadin for anticoagulation currently on alternating doses of 3.5 and 4 mg a day INR on 05/25/2016 was 2.65 had previously been 1.83.  There's been no increased bruising beyond baseline or bleeding   Past Medical History:  Diagnosis Date  . AAA (abdominal aortic aneurysm) (HCC)   . AAA (abdominal aortic aneurysm) without rupture (HCC) 10/06/2012  . Anticoagulation goal of INR 2 to 3 06/01/2013  . Arthritis    "probably on the right leg/hip" (09/20/2013)  . Atrial fibrillation (HCC)   . B12 deficiency 09/20/2013  . Benign localized hyperplasia of prostate with urinary retention 12/23/2014  . Bladder calculus 12/23/2014   2.6cm and present since at least 2011 when it was about 1.6cm.   [name]. BPH (benign prostatic hyperplasia)   . Bronchospasm   . Cardiomyopathy (HCC) 10/15/2014  . Cellulitis   . CHF (congestive heart failure) (HCC) 09/26/2013  . Chronic venous insufficiency 10/06/2012  . Cognitive impairment   . COPD (chronic obstructive pulmonary disease) (HCC)   . Daily headache   . Dementia    "don't know exactly what kind" (  09/20/2013)  . DVT (deep venous thrombosis) (HCC) 05/04/2013   "LLE"  . E. coli UTI 11/17/15   Cipro X 7 days  . Edema-Bilat Leg and foot 03/07/2013  . Elevated PSA   . Fall at home September 18, 2013  . Glaucoma, both eyes   . Kidney stones   . Multifactorial dementia 10/06/2012  . Nicotine dependence   . Obesity, unspecified 10/06/2012  . Peripheral vascular disease (HCC) 09/20/2012  . Pyelonephritis, acute 12/22/2014  . Ulcer (HCC)   . Venous insufficiency    . Weakness 09/18/2013  . Wound cellulitis    let ankle wound size of half dollar [facility] on 03/25/2015   Past Surgical History:  Procedure Laterality Date  . BACK SURGERY    . CATARACT EXTRACTION W/ INTRAOCULAR LENS  IMPLANT, BILATERAL Bilateral   . CHOLECYSTECTOMY  2011   Gall Bladder  . CYSTOSCOPY W/ STONE MANIPULATION  1970's   "once"  . CYSTOSCOPY WITH LITHOLAPAXY N/A 03/28/2015   Procedure: CYSTOSCOPY WITH LITHOLAPAXY;  Surgeon: Malen GauzePatrick L McKenzie, MD;  Location: [facility];  Service: Urology;  Laterality: N/A;  . EYE SURGERY    . FEMUR FRACTURE SURGERY Right 04/1985  . HIP FRACTURE SURGERY Right 2009  . INSITU PERCUTANEOUS PINNING FEMUR Right 1986  . POSTERIOR LAMINECTOMY / DECOMPRESSION LUMBAR SPINE  2004   Decompressive laminectomy unilaterally on the right at L4-5 with  . SPINE SURGERY    . TRANSURETHRAL RESECTION OF PROSTATE N/A 03/28/2015   Procedure: TRANSURETHRAL RESECTION OF THE PROSTATE ;  Surgeon: Malen GauzePatrick L McKenzie, MD;  Location: [facility];  Service: Urology;  Laterality: N/A;    Allergies  Allergen Reactions  . Oxycodone Hcl Other (See Comments)     makes pt "wild"  . Propoxyphene N-Acetaminophen Other (See Comments)     Makes pt. "wild"    Current Outpatient Prescriptions on File Prior to Visit  Medication Sig Dispense Refill  . acetaminophen (TYLENOL) 500 MG tablet Take 1,000 mg by mouth every 6 (six) hours as needed for mild pain or fever.     . Amino Acids-Protein Hydrolys (FEEDING SUPPLEMENT, PRO-STAT SUGAR FREE 64,) LIQD Take 30 mLs by mouth 2 (two) times daily between meals.    [name]. diltiazem (CARDIZEM CD) 120 MG 24 hr capsule Take 120 mg by mouth every morning.     . divalproex (DEPAKOTE) 250 MG DR tablet Take 250 mg by mouth 2 (two) times daily.     [name]. doxazosin (CARDURA) 8 MG tablet Take 8 mg by mouth every morning.     . ferrous sulfate (IRON SUPPLEMENT) 325 (65 FE) MG tablet Take 325 mg by mouth daily with breakfast.    .  Fluticasone-Salmeterol (ADVAIR) 250-50 MCG/DOSE AEPB Inhale 1 puff into the lungs 2 (two) times daily.    [name]. ipratropium (ATROVENT) 0.02 % nebulizer solution Give 1 neb tx 3 ml  inhalation three times a day. *May also give one vial 0.5 mg via nebulizer every 4 hours as needed for wheezing and/or shortness of breath    . latanoprost (XALATAN) 0.005 % ophthalmic solution Place 1 drop into both eyes at bedtime.     . Magnesium Hydroxide (MILK OF MAGNESIA PO) Give 30 ml by mouth daily as needed for constipation may use fleets enema    . methenamine (HIPREX) 1 g tablet Take 1 g by mouth 2 (two) times daily with a meal.    . metoprolol tartrate (LOPRESSOR) 25 MG tablet Take 1 tablet (  25 mg total) by mouth 2 (two) times daily. 30 tablet 0  . sertraline (ZOLOFT) 25 MG tablet Take 75 mg by mouth daily. Take three tablets by mouth once daily    . warfarin (COUMADIN) 1 MG tablet Take 0.5 mg along with 3 mg by mouth daily to equal 3.5 mg  On Mon,Wed,Fri    . warfarin (COUMADIN) 3 MG tablet Take 3 mg along with 0.5 mg to equal 3.5 mg daily On Mon,Wed,Fri    . warfarin (COUMADIN) 4 MG tablet Once a day on Sun, Tues,Thu, Sat give 4 mg at 5:00 PM     No current facility-administered medications on file prior to visit.      Review of Systems   This is limited secondary to patient being a poor historian with some dementia-he is not complaining of any shortness of breath or discomfort is not complaining of any right arm discomfort-    Immunization History  Administered Date(s) Administered  . Influenza,inj,Quad PF,36+ Mos 03/20/2013, 03/23/2014, 02/11/2015  . Influenza-Unspecified 03/05/2016  . PPD Test 12/26/2014, 01/09/2015  . Pneumococcal Polysaccharide-23 03/02/2007, 02/11/2015  . Pneumococcal-Unspecified 03/09/2016  . Td 12/30/1996   Pertinent  Health Maintenance Due  Topic Date Due  . PNA vac Low Risk Adult (2 of 2 - PCV13) 03/09/2017  . INFLUENZA VACCINE  Completed   Fall Risk  01/04/2015  04/18/2014 08/15/2013  Falls in the past year? Yes Yes Yes  Number falls in past yr: 2 or more 1 2 or more  Injury with Fall? No Yes -  Risk Factor Category  - High Fall Risk High Fall Risk  Risk for fall due to : Impaired balance/gait;Impaired mobility Impaired mobility;Mental status change History of fall(s);Impaired balance/gait;Impaired mobility;Mental status change   Functional Status Survey:     There is no height or weight on file to calculate BMI. Physical Exam   He is afebrile pulse of 80 respirations of 17 O2 sats rations have been in the 90s In general this is a somewhat obese elderly male in no distress  He is lying in bed and appears to be frail but in no distress  His skin is warm and dry he does have some bruising most apparent arms bilaterally This appears to be chronicthere is no diaphoresis      He has venous stasis changes -- lower leg both legs are currently wrapped  Eyes sclera and conjunctiva clear visual acuity appears grossly intact.Somewhat difficult exam since any attempt to examine he shuts his eyes tightly  Oropharynx is clear mucous membranes slightly dry.  Chest no sign of distress or labored breathing--he has some bronchial sounds scattered which are relatively baseline  Heart is regular rate and rhythm with an occasional irregular beat he has again venous stasis changes lower extremities bilaterally.-Has a 2/6 systolic murmur-heart sounds were distant rate appear to be in the 80s  Abdomen is obese soft nontender with positive bowel sounds GU he does have a Foley catheter drainingamber colored urine.  Musculoskeletal appearsable to move all extremities 4 grip strength appears intact bilaterally With baseline lower extremity weakness There is some edema to his right forearm this is cool to touch his radial pulse is intact he does not really have significant increased erythema warmth or tenderness to palpation range of motion of  the arm appears to be appropriate   .  Neurologic appear to be grossly intactno lateralizing findings he does have lower extremity weakness   Has fairly persistent tremors of his upper  extremities.  Psych he is oriented to self was pleasant and cooperative with exam--wondered what all the fuss was about with me examining him  Labs reviewed:   Recent Labs  03/30/16 0700  03/30/16 1640  04/09/16 0500 05/07/16 0715 05/14/16 0622  NA 142  < >  --   < > 139 144 141  K 4.0  < >  --   < > 3.7 3.8 3.8  CL 108  < >  --   < > 107 109 108  CO2 28  < >  --   < > 27 29 28   GLUCOSE 104*  < >  --   < > 88 94 102*  BUN 24*  < >  --   < > 18 22* 23*  CREATININE 0.94  < >  --   < > 0.92 1.04 0.91  CALCIUM 8.7*  < >  --   < > 8.6* 8.8* 8.6*  MG 2.1  --  2.0  --   --   --   --   PHOS  --   --  2.8  --   --   --   --   < > = values in this interval not displayed.  Recent Labs  03/25/16 1240 03/30/16 1256 03/31/16 0627  AST 21 19 12*  ALT 13* 16* 13*  ALKPHOS 54 55 45  BILITOT 0.6 0.7 0.7  PROT 7.0 7.1 5.8*  ALBUMIN 3.1* 3.1* 2.5*    Recent Labs  03/31/16 0627 04/16/2016 0615 04/09/16 0500 05/07/16 0715 05/14/16 0622  WBC 6.1 7.0 5.3 6.7 9.1  NEUTROABS 2.7 3.5  --   --  4.7  HGB 9.5* 9.2* 9.5* 10.2* 10.4*  HCT 31.5* 30.3* 31.1* 33.8* 33.9*  MCV 89.2 89.4 89.4 89.2 90.2  PLT 262 275 222 281 254   Lab Results  Component Value Date   TSH 1.960 09/22/2013   Lab Results  Component Value Date   HGBA1C 6.3 (H) 10/24/2015   Lab Results  Component Value Date   CHOL 157 10/06/2012   HDL 34 (L) 10/06/2012   LDLCALC 96 10/06/2012   TRIG 135 10/06/2012   CHOLHDL 4.6 10/06/2012    Significant Diagnostic Results in last 30 days:  No results found.  Assessment/Plan  .Right arm edema-we will order venous Doppler rule out DVT although my suspicions are this is more dependent edema I have spoken with nursing about trying to encourage elevation here will monitor he  does not appear to be distressed or in pain with this.  #2 history of-AFIB he is on Coumadin and INR is therapeutic at 2.65 it has gone up however from 1.83 last week will recheck this on Friday, December 29 to ensure stability.--Rate.controlled on diltiazem and metoprolol  #3 history of cough this appears to be somewhat improved from previous exams as noted above continues on nebulizers routinely 3 times a day as well as when necessary also is on Advair.  [id number]      London Sheer, CMA [phone]

## 2016-05-29 ENCOUNTER — Encounter (HOSPITAL_COMMUNITY)
Admission: AD | Admit: 2016-05-29 | Discharge: 2016-05-29 | Disposition: A | Discharge status: Home / Self Care | Payer: Medicare Other | Source: Skilled Nursing Facility | Attending: Internal Medicine | Admitting: Internal Medicine

## 2016-05-29 DIAGNOSIS — I5032 Chronic diastolic (congestive) heart failure: Secondary | ICD-10-CM | POA: Insufficient documentation

## 2016-05-29 DIAGNOSIS — R293 Abnormal posture: Secondary | ICD-10-CM | POA: Diagnosis not present

## 2016-05-29 DIAGNOSIS — Z7901 Long term (current) use of anticoagulants: Secondary | ICD-10-CM | POA: Diagnosis not present

## 2016-05-29 DIAGNOSIS — F0391 Unspecified dementia with behavioral disturbance: Secondary | ICD-10-CM | POA: Diagnosis not present

## 2016-05-29 DIAGNOSIS — R1312 Dysphagia, oropharyngeal phase: Secondary | ICD-10-CM | POA: Diagnosis not present

## 2016-05-29 DIAGNOSIS — J189 Pneumonia, unspecified organism: Secondary | ICD-10-CM | POA: Diagnosis not present

## 2016-05-29 LAB — PROTIME-INR
INR: 2.46
Prothrombin Time: 27.2 seconds — ABNORMAL HIGH (ref 11.4–15.2)

## 2016-06-02 ENCOUNTER — Encounter (HOSPITAL_COMMUNITY)
Admission: RE | Admit: 2016-06-02 | Discharge: 2016-06-02 | Disposition: A | Discharge status: Home / Self Care | Payer: Medicare Other | Source: Skilled Nursing Facility | Attending: Internal Medicine | Admitting: Internal Medicine

## 2016-06-02 DIAGNOSIS — F39 Unspecified mood [affective] disorder: Secondary | ICD-10-CM | POA: Insufficient documentation

## 2016-06-02 DIAGNOSIS — F0391 Unspecified dementia with behavioral disturbance: Secondary | ICD-10-CM | POA: Diagnosis present

## 2016-06-02 DIAGNOSIS — Z7901 Long term (current) use of anticoagulants: Secondary | ICD-10-CM | POA: Insufficient documentation

## 2016-06-02 DIAGNOSIS — I482 Chronic atrial fibrillation: Secondary | ICD-10-CM | POA: Diagnosis present

## 2016-06-02 DIAGNOSIS — I5032 Chronic diastolic (congestive) heart failure: Secondary | ICD-10-CM | POA: Insufficient documentation

## 2016-06-02 DIAGNOSIS — R1312 Dysphagia, oropharyngeal phase: Secondary | ICD-10-CM | POA: Diagnosis not present

## 2016-06-02 LAB — PROTIME-INR
INR: 2.43
Prothrombin Time: 26.9 seconds — ABNORMAL HIGH (ref 11.4–15.2)

## 2016-06-03 ENCOUNTER — Ambulatory Visit: Payer: Self-pay | Admitting: Urology

## 2016-06-03 DIAGNOSIS — R1312 Dysphagia, oropharyngeal phase: Secondary | ICD-10-CM | POA: Diagnosis not present

## 2016-06-04 DIAGNOSIS — I87312 Chronic venous hypertension (idiopathic) with ulcer of left lower extremity: Secondary | ICD-10-CM | POA: Diagnosis not present

## 2016-06-04 DIAGNOSIS — L97322 Non-pressure chronic ulcer of left ankle with fat layer exposed: Secondary | ICD-10-CM | POA: Diagnosis not present

## 2016-06-04 DIAGNOSIS — I739 Peripheral vascular disease, unspecified: Secondary | ICD-10-CM | POA: Diagnosis not present

## 2016-06-04 DIAGNOSIS — R1312 Dysphagia, oropharyngeal phase: Secondary | ICD-10-CM | POA: Diagnosis not present

## 2016-06-05 ENCOUNTER — Encounter: Payer: Self-pay | Admitting: Internal Medicine

## 2016-06-05 ENCOUNTER — Non-Acute Institutional Stay (SKILLED_NURSING_FACILITY): Payer: Medicare Other | Admitting: Internal Medicine

## 2016-06-05 DIAGNOSIS — H109 Unspecified conjunctivitis: Secondary | ICD-10-CM

## 2016-06-05 NOTE — Progress Notes (Signed)
Location:   [facility] Room Number: 121/W Place of Service:  SNF [phone]) Provider:  Edmon Crape  [name], MD  Patient Care Team: [name], MD as PCP - General (Internal Medicine) [name], MD as Attending Physician (Urology)  Extended Emergency Contact Information Primary Emergency Contact: [name],[name] Address: [address]          [place], [place] [address] [name] Home Phone: [phone] Mobile Phone: [phone] Relation: Daughter Secondary Emergency Contact: [facility] Address: [address]          [place], [place] [address] [name] Home Phone: [phone] Mobile Phone: [phone] Relation: Son  Code Status:  DNR Goals of care: Advanced Directive information Advanced Directives 06/05/2016  Does Patient Have a Medical Advance Directive? Yes  Type of Advance Directive Out of facility DNR (pink MOST or yellow form)  Does patient want to make changes to medical advance directive? No - Patient declined  Copy of Healthcare Power of Attorney in Chart? -  Would patient like information on creating a medical advance directive? -  Pre-existing out of facility DNR order (yellow form or pink MOST form) -    Chief complaint-exudate from eyes  HPI:  Pt is a 83 y.o. male seen today for an acute visit for apparently some discharge from his eyes. He is a poor historian secondary to dementia but is not really complaining of any eye discomft or visual changes       Past Medical History:  Diagnosis Date  . AAA (abdominal aortic aneurysm) (HCC)   . AAA (abdominal aortic aneurysm) without rupture (HCC) 10/06/2012  . Anticoagulation goal of INR 2 to 3 06/01/2013  . Arthritis    "probably on the right leg/hip" (09/20/2013)  . Atrial fibrillation (HCC)   . B12 deficiency 09/20/2013  . Benign localized hyperplasia of prostate with urinary retention 12/23/2014  . Bladder calculus 12/23/2014   2.6cm and present since at  least 2011 when it was about 1.6cm.   [name] BPH (benign prostatic hyperplasia)   . Bronchospasm   . Cardiomyopathy (HCC) 10/15/2014  . Cellulitis   . CHF (congestive heart failure) (HCC) 09/26/2013  . Chronic venous insufficiency 10/06/2012  . Cognitive impairment   . COPD (chronic obstructive pulmonary disease) (HCC)   . Daily headache   . Dementia    "don't know exactly what kind" (09/20/2013)  . DVT (deep venous thrombosis) (HCC) 05/04/2013   "LLE"  . E. coli UTI 11/17/15   Cipro X 7 days  . Edema-Bilat Leg and foot 03/07/2013  . Elevated PSA   . Fall at home September 18, 2013  . Glaucoma, both eyes   . Kidney stones   . Multifactorial dementia 10/06/2012  . Nicotine dependence   . Obesity, unspecified 10/06/2012  . Peripheral vascular disease (HCC) 09/20/2012  . Pyelonephritis, acute 12/22/2014  . Ulcer (HCC)   . Venous insufficiency   . Weakness 09/18/2013  . Wound cellulitis    let ankle wound size of half dollar per nurse at [facility] on 03/25/2015   Past Surgical History:  Procedure Laterality Date  . BACK SURGERY    . CATARACT EXTRACTION W/ INTRAOCULAR LENS  IMPLANT, BILATERAL Bilateral   . CHOLECYSTECTOMY  2011   Gall Bladder  . CYSTOSCOPY W/ STONE MANIPULATION  1970's   "once"  . CYSTOSCOPY WITH LITHOLAPAXY N/A 03/28/2015   Procedure: CYSTOSCOPY WITH LITHOLAPAXY;  Surgeon: Malen Gauze, MD;  Location: [facility];  Service: Urology;  Laterality: N/A;  . EYE SURGERY    . FEMUR FRACTURE SURGERY Right 04/1985  . HIP FRACTURE SURGERY Right 2009  . INSITU PERCUTANEOUS PINNING FEMUR Right 1986  . POSTERIOR LAMINECTOMY / DECOMPRESSION LUMBAR SPINE  2004   Decompressive laminectomy unilaterally on the right at L4-5 with  . SPINE SURGERY    . TRANSURETHRAL RESECTION OF PROSTATE N/A 03/28/2015   Procedure: TRANSURETHRAL RESECTION OF THE PROSTATE ;  Surgeon: Malen GauzePatrick L McKenzie, MD;  Location: [facility];  Service: Urology;  Laterality: N/A;    Allergies  Allergen Reactions  .  Oxycodone Hcl Other (See Comments)     makes pt "wild"  . Propoxyphene N-Acetaminophen Other (See Comments)     Makes pt. "wild"    Current Outpatient Prescriptions on File Prior to Visit  Medication Sig Dispense Refill  . acetaminophen (TYLENOL) 500 MG tablet Take 1,000 mg by mouth every 6 (six) hours as needed for mild pain or fever.     . Amino Acids-Protein Hydrolys (FEEDING SUPPLEMENT, PRO-STAT SUGAR FREE 64,) LIQD Take 30 mLs by mouth 2 (two) times daily between meals.    Lucilla Lame. Balsam [place]-Castor Oil (VENELEX) OINT Apply to sacrum and bilateral buttocks q shift and prn prevention    . diltiazem (CARDIZEM CD) 120 MG 24 hr capsule Take 120 mg by mouth every morning.     . divalproex (DEPAKOTE) 250 MG DR tablet Take 250 mg by mouth 2 (two) times daily.     [name]. doxazosin (CARDURA) 8 MG tablet Take 8 mg by mouth every morning.     . ferrous sulfate (IRON SUPPLEMENT) 325 (65 FE) MG tablet Take 325 mg by mouth daily with breakfast.    . Fluticasone-Salmeterol (ADVAIR) 250-50 MCG/DOSE AEPB Inhale 1 puff into the lungs 2 (two) times daily.    [name]. ipratropium (ATROVENT) 0.02 % nebulizer solution Give 1 neb tx 3 ml  inhalation three times a day. *May also give one vial 0.5 mg via nebulizer every 4 hours as needed for wheezing and/or shortness of breath    . latanoprost (XALATAN) 0.005 % ophthalmic solution Place 1 drop into both eyes at bedtime.     . Magnesium Hydroxide (MILK OF MAGNESIA PO) Give 30 ml by mouth daily as needed for constipation may use fleets enema    . methenamine (HIPREX) 1 g tablet Take 1 g by mouth 2 (two) times daily with a meal.    . metoprolol tartrate (LOPRESSOR) 25 MG tablet Take 1 tablet (25 mg total) by mouth 2 (two) times daily. 30 tablet 0  . sertraline (ZOLOFT) 25 MG tablet Take 75 mg by mouth daily. Take three tablets by mouth once daily    . warfarin (COUMADIN) 1 MG tablet Take 0.5 mg along with 3 mg by mouth daily to equal 3.5 mg  On Mon,Wed,Fri    . warfarin (COUMADIN) 3  MG tablet Take 3 mg along with 0.5 mg to equal 3.5 mg daily On Mon,Wed,Fri    . warfarin (COUMADIN) 4 MG tablet Once a day on Sun, Tues,Thu, Sat give 4 mg at 5:00 PM     No current facility-administered medications on file prior to visit.     Review of Systems   This is limited secondary to dementia but he is not complaining of any eye pain or visual changes  Immunization History  Administered Date(s) Administered  . Influenza,inj,Quad PF,36+ Mos 03/20/2013, 03/23/2014, 02/11/2015  . Influenza-Unspecified 03/05/2016  . PPD Test 12/26/2014, 01/09/2015  .  Pneumococcal Polysaccharide-23 03/02/2007, 02/11/2015  . Pneumococcal-Unspecified 03/09/2016  . Td 12/30/1996   Pertinent  Health Maintenance Due  Topic Date Due  . PNA vac Low Risk Adult (2 of 2 - PCV13) 03/09/2017  . INFLUENZA VACCINE  Completed   Fall Risk  01/04/2015 04/18/2014 08/15/2013  Falls in the past year? Yes Yes Yes  Number falls in past yr: 2 or more 1 2 or more  Injury with Fall? No Yes -  Risk Factor Category  - High Fall Risk High Fall Risk  Risk for fall due to : Impaired balance/gait;Impaired mobility Impaired mobility;Mental status change History of fall(s);Impaired balance/gait;Impaired mobility;Mental status change   Functional Status Survey:      Physical Exam   In general this is an elderly  male in no distress lying comfortably in bed  Eyes bilaterally appear to be generally clear-I do not see any exudate bilaterally pupils are reactive light visual acuity appears to be grossly intact I do not see any surrounding erythema    Labs reviewed:  Recent Labs  03/30/16 0700  03/30/16 1640  04/09/16 0500 05/07/16 0715 05/14/16 0622  NA 142  < >  --   < > 139 144 141  K 4.0  < >  --   < > 3.7 3.8 3.8  CL 108  < >  --   < > 107 109 108  CO2 28  < >  --   < > 27 29 28   GLUCOSE 104*  < >  --   < > 88 94 102*  BUN 24*  < >  --   < > 18 22* 23*  CREATININE 0.94  < >  --   < > 0.92 1.04 0.91  CALCIUM  8.7*  < >  --   < > 8.6* 8.8* 8.6*  MG 2.1  --  2.0  --   --   --   --   PHOS  --   --  2.8  --   --   --   --   < > = values in this interval not displayed.  Recent Labs  03/25/16 1240 03/30/16 1256 03/31/16 0627  AST 21 19 12*  ALT 13* 16* 13*  ALKPHOS 54 55 45  BILITOT 0.6 0.7 0.7  PROT 7.0 7.1 5.8*  ALBUMIN 3.1* 3.1* 2.5*    Recent Labs  03/31/16 0627 April 05, 2016 0615 04/09/16 0500 05/07/16 0715 05/14/16 0622  WBC 6.1 7.0 5.3 6.7 9.1  NEUTROABS 2.7 3.5  --   --  4.7  HGB 9.5* 9.2* 9.5* 10.2* 10.4*  HCT 31.5* 30.3* 31.1* 33.8* 33.9*  MCV 89.2 89.4 89.4 89.2 90.2  PLT 262 275 222 281 254   Lab Results  Component Value Date   TSH 1.960 09/22/2013   Lab Results  Component Value Date   HGBA1C 6.3 (H) 10/24/2015   Lab Results  Component Value Date   CHOL 157 10/06/2012   HDL 34 (L) 10/06/2012   LDLCALC 96 10/06/2012   TRIG 135 10/06/2012   CHOLHDL 4.6 10/06/2012    Significant Diagnostic Results in last 30 days:  No results found.  Assessment/Plan  ?  conjunctivitis-at this point physical exam was rather benign but this will have to be watched carefully have spoken with nursing at this point will with hold antibiotics however if there is crusting in the morning suspect we will start an antibiotic eyedrop.  We will wait nursing assessment in the morning.  [id number]  Oralia Manis, [place]

## 2016-06-09 ENCOUNTER — Encounter (HOSPITAL_COMMUNITY)
Admission: RE | Admit: 2016-06-09 | Discharge: 2016-06-09 | Disposition: A | Discharge status: Home / Self Care | Payer: Medicare Other | Source: Skilled Nursing Facility | Attending: Internal Medicine | Admitting: Internal Medicine

## 2016-06-09 DIAGNOSIS — Z7901 Long term (current) use of anticoagulants: Secondary | ICD-10-CM | POA: Diagnosis not present

## 2016-06-09 DIAGNOSIS — I5032 Chronic diastolic (congestive) heart failure: Secondary | ICD-10-CM | POA: Diagnosis not present

## 2016-06-09 DIAGNOSIS — R1312 Dysphagia, oropharyngeal phase: Secondary | ICD-10-CM | POA: Diagnosis not present

## 2016-06-09 LAB — PROTIME-INR
INR: 1.99
Prothrombin Time: 22.9 seconds — ABNORMAL HIGH (ref 11.4–15.2)

## 2016-06-10 ENCOUNTER — Non-Acute Institutional Stay (SKILLED_NURSING_FACILITY): Payer: Medicare Other | Admitting: Internal Medicine

## 2016-06-10 ENCOUNTER — Encounter: Payer: Self-pay | Admitting: Internal Medicine

## 2016-06-10 DIAGNOSIS — I872 Venous insufficiency (chronic) (peripheral): Secondary | ICD-10-CM

## 2016-06-10 DIAGNOSIS — L97319 Non-pressure chronic ulcer of right ankle with unspecified severity: Secondary | ICD-10-CM | POA: Diagnosis not present

## 2016-06-10 DIAGNOSIS — J411 Mucopurulent chronic bronchitis: Secondary | ICD-10-CM

## 2016-06-10 DIAGNOSIS — J069 Acute upper respiratory infection, unspecified: Secondary | ICD-10-CM | POA: Diagnosis not present

## 2016-06-10 DIAGNOSIS — R1312 Dysphagia, oropharyngeal phase: Secondary | ICD-10-CM | POA: Diagnosis not present

## 2016-06-10 DIAGNOSIS — I739 Peripheral vascular disease, unspecified: Secondary | ICD-10-CM

## 2016-06-10 NOTE — Progress Notes (Signed)
Location:   [facility] Room Number: 121/W Place of Service:  SNF [id number]) Provider:  Pollyann Savoy, MD  Patient Care Team: [name], MD as PCP - General (Internal Medicine) [name], MD as Attending Physician (Urology)  Extended Emergency Contact Information Primary Emergency Contact: [name],[name] Address: [address]          [place], [place] [address] [name] of [place] Home Phone: [phone] Mobile Phone: [phone] Relation: Daughter Secondary Emergency Contact: [facility] Address: [address]          [place], [place] [address] [name] of [place] Home Phone: [phone] Mobile Phone: [phone] Relation: Son  Code Status:  DNR Goals of care: Advanced Directive information Advanced Directives 06/10/2016  Does Patient Have a Medical Advance Directive? Yes  Type of Advance Directive Out of facility DNR (pink MOST or yellow form)  Does patient want to make changes to medical advance directive? No - Patient declined  Copy of Healthcare Power of Attorney in Chart? -  Would patient like information on creating a medical advance directive? -  Pre-existing out of facility DNR order (yellow form or pink MOST form) -     Acute Visit For Cough and Productive sputum.  HPI:  Pt is a 83 y.o. male seen today for an acute visit for  Cough and Green Sputum with wheezing.  Patient has h/o Atrial fibrillation on chronic Coumadin Therapy., COPD, DVT, Dementia with Behavior problems, BPH with chronic Foley Cathter, Recurrent UTI on chronic Hiprex,chronic venous stasis with Ulcers. And aortic aneurysm  He has h/o recurrent Pneumonia and COPD exacerbations. Thought to be due to Possible Aspiration. He has been treated with Augmentin and he has responded well. His last treatment was in 12/06. This has been d/w family and they do not want anything aggressive done just keeping him comfortable in the facility. Today nurses noticed him  coughing with Green sputum. They have also noticed him to have new area of redness come in around [place] area in Left leg. And also the area in the right which is both Stasis and pressure ulcer is worsening. Patient has not Had any fever or chills. No chest pain no SOB.  Past Medical History:  Diagnosis Date  . AAA (abdominal aortic aneurysm) (HCC)   . AAA (abdominal aortic aneurysm) without rupture (HCC) 10/06/2012  . Anticoagulation goal of INR 2 to 3 06/01/2013  . Arthritis    "probably on the right leg/hip" (09/20/2013)  . Atrial fibrillation (HCC)   . B12 deficiency 09/20/2013  . Benign localized hyperplasia of prostate with urinary retention 12/23/2014  . Bladder calculus 12/23/2014   2.6cm and present since at least 2011 when it was about 1.6cm.   [name] BPH (benign prostatic hyperplasia)   . Bronchospasm   . Cardiomyopathy (HCC) 10/15/2014  . Cellulitis   . CHF (congestive heart failure) (HCC) 09/26/2013  . Chronic venous insufficiency 10/06/2012  . Cognitive impairment   . COPD (chronic obstructive pulmonary disease) (HCC)   . Daily headache   . Dementia    "don't know exactly what kind" (09/20/2013)  . DVT (deep venous thrombosis) (HCC) 05/04/2013   "LLE"  . E. coli UTI 11/17/15   Cipro X 7 days  . Edema-Bilat Leg and foot 03/07/2013  . Elevated PSA   . Fall at home September 18, 2013  . Glaucoma, both eyes   . Kidney stones   . Multifactorial dementia 10/06/2012  . Nicotine dependence   .  Obesity, unspecified 10/06/2012  . Peripheral vascular disease (HCC) 09/20/2012  . Pyelonephritis, acute 12/22/2014  . Ulcer (HCC)   . Venous insufficiency   . Weakness 09/18/2013  . Wound cellulitis    let ankle wound size of half dollar [facility] 03/25/2015   Past Surgical History:  Procedure Laterality Date  . BACK SURGERY    . CATARACT EXTRACTION W/ INTRAOCULAR LENS  IMPLANT, BILATERAL Bilateral   . CHOLECYSTECTOMY  2011   Gall Bladder  . CYSTOSCOPY W/ STONE MANIPULATION   1970's   "once"  . CYSTOSCOPY WITH LITHOLAPAXY N/A 03/28/2015   Procedure: CYSTOSCOPY WITH LITHOLAPAXY;  Surgeon: Malen GauzePatrick L McKenzie, MD;  Location: [facility];  Service: Urology;  Laterality: N/A;  . EYE SURGERY    . FEMUR FRACTURE SURGERY Right 04/1985  . HIP FRACTURE SURGERY Right 2009  . INSITU PERCUTANEOUS PINNING FEMUR Right 1986  . POSTERIOR LAMINECTOMY / DECOMPRESSION LUMBAR SPINE  2004   Decompressive laminectomy unilaterally on the right at L4-5 with  . SPINE SURGERY    . TRANSURETHRAL RESECTION OF PROSTATE N/A 03/28/2015   Procedure: TRANSURETHRAL RESECTION OF THE PROSTATE ;  Surgeon: Malen GauzePatrick L McKenzie, MD;  Location: [facility];  Service: Urology;  Laterality: N/A;    Allergies  Allergen Reactions  . Oxycodone Hcl Other (See Comments)     makes pt "wild"  . Propoxyphene N-Acetaminophen Other (See Comments)     Makes pt. "wild"    Current Outpatient Prescriptions on File Prior to Visit  Medication Sig Dispense Refill  . acetaminophen (TYLENOL) 500 MG tablet Take 1,000 mg by mouth every 6 (six) hours as needed for mild pain or fever.     . Amino Acids-Protein Hydrolys (FEEDING SUPPLEMENT, PRO-STAT SUGAR FREE 64,) LIQD Take 30 mLs by mouth 2 (two) times daily between meals.    Lucilla Lame. Balsam [place]-Castor Oil (VENELEX) OINT Apply to sacrum and bilateral buttocks q shift and prn prevention    . diltiazem (CARDIZEM CD) 120 MG 24 hr capsule Take 120 mg by mouth every morning.     . divalproex (DEPAKOTE) 250 MG DR tablet Take 250 mg by mouth 2 (two) times daily.     [name]. doxazosin (CARDURA) 8 MG tablet Take 8 mg by mouth every morning.     . ferrous sulfate (IRON SUPPLEMENT) 325 (65 FE) MG tablet Take 325 mg by mouth daily with breakfast.    . Fluticasone-Salmeterol (ADVAIR) 250-50 MCG/DOSE AEPB Inhale 1 puff into the lungs 2 (two) times daily.    [name]. ipratropium (ATROVENT) 0.02 % nebulizer solution Give 1 neb tx 3 ml  inhalation three times a day. *May also give one vial 0.5 mg via nebulizer every  4 hours as needed for wheezing and/or shortness of breath    . latanoprost (XALATAN) 0.005 % ophthalmic solution Place 1 drop into both eyes at bedtime.     . Magnesium Hydroxide (MILK OF MAGNESIA PO) Give 30 ml by mouth daily as needed for constipation may use fleets enema    . methenamine (HIPREX) 1 g tablet Take 1 g by mouth 2 (two) times daily with a meal.    . metoprolol tartrate (LOPRESSOR) 25 MG tablet Take 1 tablet (25 mg total) by mouth 2 (two) times daily. 30 tablet 0  . sertraline (ZOLOFT) 25 MG tablet Take 75 mg by mouth daily. Take three tablets by mouth once daily    . triamcinolone cream (KENALOG) 0.1 % Triamcinolone 0.1 % and 1/2 Cetaphil  cream apply thin layer to let /right leg before wrapping with coban once a day on Mon,Wed,Fri    . warfarin (COUMADIN) 1 MG tablet Take 0.5 mg along with 3 mg by mouth daily to equal 3.5 mg  On Mon,Wed,Fri    . warfarin (COUMADIN) 3 MG tablet Take 3 mg along with 0.5 mg to equal 3.5 mg daily On Mon,Wed,Fri    . warfarin (COUMADIN) 4 MG tablet Once a day on Sun, Tues,Thu, Sat give 4 mg at 5:00 PM     No current facility-administered medications on file prior to visit.      Review of Systems  Unable to perform ROS: Dementia    Immunization History  Administered Date(s) Administered  . Influenza,inj,Quad PF,36+ Mos 03/20/2013, 03/23/2014, 02/11/2015  . Influenza-Unspecified 03/05/2016  . PPD Test 12/26/2014, 01/09/2015  . Pneumococcal Polysaccharide-23 03/02/2007, 02/11/2015  . Pneumococcal-Unspecified 03/09/2016  . Td 12/30/1996   Pertinent  Health Maintenance Due  Topic Date Due  . PNA vac Low Risk Adult (2 of 2 - PCV13) 03/09/2017  . INFLUENZA VACCINE  Completed   Fall Risk  01/04/2015 04/18/2014 08/15/2013  Falls in the past year? Yes Yes Yes  Number falls in past yr: 2 or more 1 2 or more  Injury with Fall? No Yes -  Risk Factor Category  - High Fall Risk High Fall Risk  Risk for fall due to : Impaired balance/gait;Impaired  mobility Impaired mobility;Mental status change History of fall(s);Impaired balance/gait;Impaired mobility;Mental status change   Functional Status Survey:    Vitals:   06/10/16 1037  BP: 119/64  Pulse: (!) 58  Resp: 20  Temp: 97.7 F (36.5 C)  TempSrc: Oral   There is no height or weight on file to calculate BMI. Physical Exam  Constitutional: He appears well-developed and well-nourished.  HENT:  Head: Normocephalic.  Mouth/Throat: Oropharynx is clear and moist.  Cardiovascular: Normal rate and normal heart sounds.   Pulmonary/Chest: Effort normal. He has wheezes.  Has Bilateral Expiratory Wheezing.  Abdominal: Soft. He exhibits no distension. There is no tenderness. There is no rebound.  Musculoskeletal: He exhibits edema.  Lymphadenopathy:    He has no cervical adenopathy.  Skin:  Patient has some redness around shin area in Left leg. Right Lateral malleolus has Stage 3 ulcer  With Scab on it with redness around the scab. Not tender.    Labs reviewed:  Recent Labs  03/30/16 0700  03/30/16 1640  04/09/16 0500 05/07/16 0715 05/14/16 0622  NA 142  < >  --   < > 139 144 141  K 4.0  < >  --   < > 3.7 3.8 3.8  CL 108  < >  --   < > 107 109 108  CO2 28  < >  --   < > 27 29 28   GLUCOSE 104*  < >  --   < > 88 94 102*  BUN 24*  < >  --   < > 18 22* 23*  CREATININE 0.94  < >  --   < > 0.92 1.04 0.91  CALCIUM 8.7*  < >  --   < > 8.6* 8.8* 8.6*  MG 2.1  --  2.0  --   --   --   --   PHOS  --   --  2.8  --   --   --   --   < > = values in this interval not displayed.  Recent Labs  03/25/16 1240 03/30/16 1256 03/31/16 0627  AST 21 19 12*  ALT 13* 16* 13*  ALKPHOS 54 55 45  BILITOT 0.6 0.7 0.7  PROT 7.0 7.1 5.8*  ALBUMIN 3.1* 3.1* 2.5*    Recent Labs  03/31/16 0627 04/08/2016 0615 04/09/16 0500 05/07/16 0715 05/14/16 0622  WBC 6.1 7.0 5.3 6.7 9.1  NEUTROABS 2.7 3.5  --   --  4.7  HGB 9.5* 9.2* 9.5* 10.2* 10.4*  HCT 31.5* 30.3* 31.1* 33.8* 33.9*  MCV 89.2  89.4 89.4 89.2 90.2  PLT 262 275 222 281 254   Lab Results  Component Value Date   TSH 1.960 09/22/2013   Lab Results  Component Value Date   HGBA1C 6.3 (H) 10/24/2015   Lab Results  Component Value Date   CHOL 157 10/06/2012   HDL 34 (L) 10/06/2012   LDLCALC 96 10/06/2012   TRIG 135 10/06/2012   CHOLHDL 4.6 10/06/2012    Significant Diagnostic Results in last 30 days:  No results found.  Assessment/Plan  Upper Respiratory tract infection With COPD Knowing patients history d/w daughter she does not want Chest Xray at this time. Will start him on Augmentin. Increase his Duo Nebs to every 6 Hours. Continue to monitor POX and vitals. Do not need Blood work right now.  LE Wound. D/W the wound care nurse. We will start him on antibiotics and wound Doctor will see if he needs debridement but prognosis for healing is poor. Chronic Anticoagulation Continue same dose of Coumadin. INR has been stable As per family wishes patient stays in the facility to keep him comfortable. Family/ staff Communication:   Labs/tests ordered:

## 2016-06-11 DIAGNOSIS — L97812 Non-pressure chronic ulcer of other part of right lower leg with fat layer exposed: Secondary | ICD-10-CM | POA: Diagnosis not present

## 2016-06-11 DIAGNOSIS — L97822 Non-pressure chronic ulcer of other part of left lower leg with fat layer exposed: Secondary | ICD-10-CM | POA: Diagnosis not present

## 2016-06-11 DIAGNOSIS — I872 Venous insufficiency (chronic) (peripheral): Secondary | ICD-10-CM | POA: Diagnosis not present

## 2016-06-12 DIAGNOSIS — R1312 Dysphagia, oropharyngeal phase: Secondary | ICD-10-CM | POA: Diagnosis not present

## 2016-06-16 ENCOUNTER — Non-Acute Institutional Stay (SKILLED_NURSING_FACILITY): Payer: Medicare Other | Admitting: Internal Medicine

## 2016-06-16 ENCOUNTER — Other Ambulatory Visit (HOSPITAL_COMMUNITY)
Admission: AD | Admit: 2016-06-16 | Discharge: 2016-06-16 | Disposition: A | Discharge status: Home / Self Care | Payer: Medicare Other | Source: Skilled Nursing Facility | Attending: Internal Medicine | Admitting: Internal Medicine

## 2016-06-16 ENCOUNTER — Encounter: Payer: Self-pay | Admitting: Internal Medicine

## 2016-06-16 DIAGNOSIS — J069 Acute upper respiratory infection, unspecified: Secondary | ICD-10-CM | POA: Diagnosis not present

## 2016-06-16 DIAGNOSIS — I480 Paroxysmal atrial fibrillation: Secondary | ICD-10-CM | POA: Diagnosis not present

## 2016-06-16 DIAGNOSIS — R195 Other fecal abnormalities: Secondary | ICD-10-CM | POA: Diagnosis not present

## 2016-06-16 DIAGNOSIS — D649 Anemia, unspecified: Secondary | ICD-10-CM | POA: Diagnosis not present

## 2016-06-16 DIAGNOSIS — R000 Tachycardia, unspecified: Secondary | ICD-10-CM | POA: Diagnosis not present

## 2016-06-16 DIAGNOSIS — R197 Diarrhea, unspecified: Secondary | ICD-10-CM | POA: Insufficient documentation

## 2016-06-16 LAB — C DIFFICILE QUICK SCREEN W PCR REFLEX
C Diff antigen: NEGATIVE
C Diff interpretation: NOT DETECTED
C Diff toxin: NEGATIVE

## 2016-06-16 NOTE — Progress Notes (Signed)
Location:   [facility] Room Number: 121/W Place of Service:  SNF [phone]) Provider:  Edmon Crape  [name], MD  Patient Care Team: [name], MD as PCP - General (Internal Medicine) [name], MD as Attending Physician (Urology)  Extended Emergency Contact Information Primary Emergency Contact: [name],[name] Address: [address]          [place], [place] [address] [name] of [place] Home Phone: [phone] Mobile Phone: [phone] Relation: Daughter Secondary Emergency Contact: Kishbaugh,[name] Address: [address]          [place], [place] [address] [name] of [place] Home Phone: [phone] Mobile Phone: [phone] Relation: Son  Code Status:  DNR Goals of care: Advanced Directive information Advanced Directives 06/16/2016  Does Patient Have a Medical Advance Directive? Yes  Type of Advance Directive Out of facility DNR (pink MOST or yellow form)  Does patient want to make changes to medical advance directive? No - Patient declined  Copy of Healthcare Power of Attorney in Chart? -  Would patient like information on creating a medical advance directive? -  Pre-existing out of facility DNR order (yellow form or pink MOST form) -     Chief Complaint  Patient presents with  . Acute Visit    Pulse betwen 118-133   Also follow-up URI HPI:  Pt is a 83 y.o. male seen today for an acute visit for reported tachycardia with pulse between 118 and 133-he does have a history of atrial fibrillation he is on metoprolol 25 mg twice a day as well as Cardizem 120 mg a day this is the extended release form.  It appears these readings were taken by machine when I saw patient is pulse actually was 76.  Blood pressure was stable at 117/68 otherwise appear to be at his baseline.  I did recheck his pulse about an hour later and also got a similar reading-I checked his radial pulse as well which was similar to what I was hearing apically.  I would  suspect this may have been a machine variation that had some difficulty actually truly registering his rate--I note he is also on duo nebs which could increase his heart rate-he is on this routinely 3 times a day as well as when necessary every 4 hours that she is also on Advair Diskus twice a day  He is lying in bed comfortably had his baseline.  He still has some mild chest congestion this does not appear be greatly changed from his baseline he is receiving treatment for suspected URI with Augmentin he is completing the course it appears tomorrow.  Also note he is had some loose stool   although staff says this is not totally new and at times is somewhat clear-he has been started on a probiotic we will check a C. difficile culture as well with his use of Augmentin --   He has no complaints today he gradually appears to be becoming somewhat weaker-emphasis is more on comfort with no aggressive interventions hospitalization.     Past Medical History:  Diagnosis Date  . AAA (abdominal aortic aneurysm) (HCC)   . AAA (abdominal aortic aneurysm) without rupture (HCC) 10/06/2012  . Anticoagulation goal of INR 2 to 3 06/01/2013  . Arthritis    "probably on the right leg/hip" (09/20/2013)  . Atrial fibrillation (HCC)   . B12 deficiency 09/20/2013  . Benign localized hyperplasia of prostate with urinary retention 12/23/2014  . Bladder calculus 12/23/2014   2.6cm  and present since at least 2011 when it was about 1.6cm.   [name]. BPH (benign prostatic hyperplasia)   . Bronchospasm   . Cardiomyopathy (HCC) 10/15/2014  . Cellulitis   . CHF (congestive heart failure) (HCC) 09/26/2013  . Chronic venous insufficiency 10/06/2012  . Cognitive impairment   . COPD (chronic obstructive pulmonary disease) (HCC)   . Daily headache   . Dementia    "don't know exactly what kind" (09/20/2013)  . DVT (deep venous thrombosis) (HCC) 05/04/2013   "LLE"  . E. coli UTI 11/17/15   Cipro X 7 days  . Edema-Bilat Leg and foot  03/07/2013  . Elevated PSA   . Fall at home September 18, 2013  . Glaucoma, both eyes   . Kidney stones   . Multifactorial dementia 10/06/2012  . Nicotine dependence   . Obesity, unspecified 10/06/2012  . Peripheral vascular disease (HCC) 09/20/2012  . Pyelonephritis, acute 12/22/2014  . Ulcer (HCC)   . Venous insufficiency   . Weakness 09/18/2013  . Wound cellulitis    let ankle wound size of half dollar [facility] on 03/25/2015   Past Surgical History:  Procedure Laterality Date  . BACK SURGERY    . CATARACT EXTRACTION W/ INTRAOCULAR LENS  IMPLANT, BILATERAL Bilateral   . CHOLECYSTECTOMY  2011   Gall Bladder  . CYSTOSCOPY W/ STONE MANIPULATION  1970's   "once"  . CYSTOSCOPY WITH LITHOLAPAXY N/A 03/28/2015   Procedure: CYSTOSCOPY WITH LITHOLAPAXY;  Surgeon: Malen GauzePatrick L McKenzie, MD;  Location: [facility];  Service: Urology;  Laterality: N/A;  . EYE SURGERY    . FEMUR FRACTURE SURGERY Right 04/1985  . HIP FRACTURE SURGERY Right 2009  . INSITU PERCUTANEOUS PINNING FEMUR Right 1986  . POSTERIOR LAMINECTOMY / DECOMPRESSION LUMBAR SPINE  2004   Decompressive laminectomy unilaterally on the right at L4-5 with  . SPINE SURGERY    . TRANSURETHRAL RESECTION OF PROSTATE N/A 03/28/2015   Procedure: TRANSURETHRAL RESECTION OF THE PROSTATE ;  Surgeon: Malen GauzePatrick L McKenzie, MD;  Location: [facility];  Service: Urology;  Laterality: N/A;    Allergies  Allergen Reactions  . Oxycodone Hcl Other (See Comments)     makes pt "wild"  . Propoxyphene N-Acetaminophen Other (See Comments)     Makes pt. "wild"    Current Outpatient Prescriptions on File Prior to Visit  Medication Sig Dispense Refill  . acetaminophen (TYLENOL) 500 MG tablet Take 1,000 mg by mouth every 6 (six) hours as needed for mild pain or fever.     . Amino Acids-Protein Hydrolys (FEEDING SUPPLEMENT, PRO-STAT SUGAR FREE 64,) LIQD Take 30 mLs by mouth 2 (two) times daily between meals.    Lucilla Lame. Balsam [place]-Castor Oil (VENELEX)  OINT Apply to sacrum and bilateral buttocks q shift and prn prevention    . diltiazem (CARDIZEM CD) 120 MG 24 hr capsule Take 120 mg by mouth every morning.     . divalproex (DEPAKOTE) 250 MG DR tablet Take 250 mg by mouth 2 (two) times daily.     [name]. doxazosin (CARDURA) 8 MG tablet Take 8 mg by mouth every morning.     . ferrous sulfate (IRON SUPPLEMENT) 325 (65 FE) MG tablet Take 325 mg by mouth daily with breakfast.    . Fluticasone-Salmeterol (ADVAIR) 250-50 MCG/DOSE AEPB Inhale 1 puff into the lungs 2 (two) times daily.    [name]. ipratropium (ATROVENT) 0.02 % nebulizer solution Take 0.5 mg by nebulization every 4 (four) hours as needed.     .[name]  latanoprost (XALATAN) 0.005 % ophthalmic solution Place 1 drop into both eyes at bedtime.     . Magnesium Hydroxide (MILK OF MAGNESIA PO) Give 30 ml by mouth daily as needed for constipation may use fleets enema    . methenamine (HIPREX) 1 g tablet Take 1 g by mouth 2 (two) times daily with a meal.    . metoprolol tartrate (LOPRESSOR) 25 MG tablet Take 1 tablet (25 mg total) by mouth 2 (two) times daily. 30 tablet 0  . sertraline (ZOLOFT) 25 MG tablet Take 75 mg by mouth daily. Take three tablets by mouth once daily    . triamcinolone cream (KENALOG) 0.1 % Triamcinolone 0.1 % and 1/2 Cetaphil cream apply thin layer to let /right leg before wrapping with coban once a day on Mon,Wed,Fri    . warfarin (COUMADIN) 1 MG tablet Take 0.5 mg along with 3 mg by mouth daily to equal 3.5 mg  On Mon,Wed,Fri    . warfarin (COUMADIN) 3 MG tablet Take 3 mg along with 0.5 mg to equal 3.5 mg daily On Mon,Wed,Fri    . warfarin (COUMADIN) 4 MG tablet Once a day on Sun, Tues,Thu, Sat give 4 mg at 5:00 PM     No current facility-administered medications on file prior to visit.     Review of Systems   This is limited secondary to dementia but he is not complaining of any fever or chills-says his breathing is unchanged he does not complain of increased shortness of breath beyond  baseline-still has a cough at times.  Is not complaining of any chest pain   Immunization History  Administered Date(s) Administered  . Influenza,inj,Quad PF,36+ Mos 03/20/2013, 03/23/2014, 02/11/2015  . Influenza-Unspecified 03/05/2016  . PPD Test 12/26/2014, 01/09/2015  . Pneumococcal Polysaccharide-23 03/02/2007, 02/11/2015  . Pneumococcal-Unspecified 03/09/2016  . Td 12/30/1996   Pertinent  Health Maintenance Due  Topic Date Due  . PNA vac Low Risk Adult (2 of 2 - PCV13) 03/09/2017  . INFLUENZA VACCINE  Completed   Fall Risk  01/04/2015 04/18/2014 08/15/2013  Falls in the past year? Yes Yes Yes  Number falls in past yr: 2 or more 1 2 or more  Injury with Fall? No Yes -  Risk Factor Category  - High Fall Risk High Fall Risk  Risk for fall due to : Impaired balance/gait;Impaired mobility Impaired mobility;Mental status change History of fall(s);Impaired balance/gait;Impaired mobility;Mental status change   Functional Status Survey:    Vitals:   06/16/16 0852  BP: 117/68  Pulse: (!) 131  Resp: (!) 22  Temp: 97.4 F (36.3 C)  TempSrc: Oral  Of note I did recheck his pulse rate manually apically and radial pulse and both times was in the 70-80 range  Physical Exam  In general this is a somewhat obese elderly male in no distress  He is lying in bed and appears to be frail but in no distress--essentially appears at his baseline  His skin is warm and dry he does have some bruising most apparent arms bilaterally This appears to be chronicthere is no diaphoresis      He has venous stasis changes -- lower leg both legs are currently wrapped  Eyes sclera and conjunctiva clear visual acuity appears grossly intact.Somewhat difficult exam since any attempt to examine he shuts his eyes tightly  Oropharynx is clear   Chest no sign of distress or labored breathing--he has some bronchial sounds--wheezing   scattered which are relatively baseline   Heart is  regular irregular rate and rhythm.-Has a 2/6 systolic murmur-heart sounds were distant rate appear to be in the 70- 80s  Abdomen is obese soft nontender with positive bowel sounds GU he does have a Foley catheter drainingamber colored urine.  Musculoskeletal appearsable to move all extremities 4 grip strength appears intact bilaterally  Lower legs are wrapped bilaterally  .  Neurologic appear to be grossly intactno lateralizing findings he does have lower extremity weakness   Has fairly persistent tremors of his upper extremities.  Psych he is oriented to self was pleasant and cooperative with exam--   Labs reviewed:  Recent Labs  03/30/16 0700  03/30/16 1640  04/09/16 0500 05/07/16 0715 05/14/16 0622  NA 142  < >  --   < > 139 144 141  K 4.0  < >  --   < > 3.7 3.8 3.8  CL 108  < >  --   < > 107 109 108  CO2 28  < >  --   < > 27 29 28   GLUCOSE 104*  < >  --   < > 88 94 102*  BUN 24*  < >  --   < > 18 22* 23*  CREATININE 0.94  < >  --   < > 0.92 1.04 0.91  CALCIUM 8.7*  < >  --   < > 8.6* 8.8* 8.6*  MG 2.1  --  2.0  --   --   --   --   PHOS  --   --  2.8  --   --   --   --   < > = values in this interval not displayed.  Recent Labs  03/25/16 1240 03/30/16 1256 03/31/16 0627  AST 21 19 12*  ALT 13* 16* 13*  ALKPHOS 54 55 45  BILITOT 0.6 0.7 0.7  PROT 7.0 7.1 5.8*  ALBUMIN 3.1* 3.1* 2.5*    Recent Labs  03/31/16 0627 04/19/2016 0615 04/09/16 0500 05/07/16 0715 05/14/16 0622  WBC 6.1 7.0 5.3 6.7 9.1  NEUTROABS 2.7 3.5  --   --  4.7  HGB 9.5* 9.2* 9.5* 10.2* 10.4*  HCT 31.5* 30.3* 31.1* 33.8* 33.9*  MCV 89.2 89.4 89.4 89.2 90.2  PLT 262 275 222 281 254   Lab Results  Component Value Date   TSH 1.960 09/22/2013   Lab Results  Component Value Date   HGBA1C 6.3 (H) 10/24/2015   Lab Results  Component Value Date   CHOL 157 10/06/2012   HDL 34 (L) 10/06/2012   LDLCALC 96 10/06/2012   TRIG 135 10/06/2012   CHOLHDL 4.6 10/06/2012     Significant Diagnostic Results in last 30 days:  No results found.  Assessment/Plan  1 tachycardia-as stated above I suspect this may be more a function of an inaccurate machine reading-I did check and recheck his pulse and both times appear to be in the 70-80 range which appears to be is relatively baseline-cannot rule out that at times nebulizers may increase his pulse rate and we will discontinue the duo nebs and stick with Atrovent routinely 3 times a day as well as when necessary every 4 hours.  In regards to the URI he still has some cough and congestion but per nursing this has improved will extend the course of Augmentin for 3 additional days.  Will monitor vital signs pulse ox every shift for 72 hours to keep an eye on his clinical status as well as his pulse although  again manual exam and auscultation is reassuring.  In regards to atrial fibrillation this appears rate controlled generally on theCardizem well as metoprolol he is on Coumadin for anticoagulation last INR was 1.99 on 06/09/2016 update INR is pending we will check this tomorrow since he is on an antibiotic and we've extended the antibiotic--also will update a metabolic panel and CBC for updated values.   Regards to anemia suspect there is no element of chronic disease as well he is on iron again will update his CBC last hemoglobin was 10.4 in December 2017 which appears to be relatively stable.  In regards to the loose stool will check a C. difficile culture as well and also will check his electrolytes nursing staff says this is somewhat chronic and off in the stool was more clear -he has been started on a probiotic-  [id number]-of note greater than 35 minutes spent assessing patient-reassessing patient-reviewing his chart-his labs-and coordinating and formulating a plan of care-of note greater than 50% of time spent coordinating plan of care including chart review reassessment as well as discussion with  nursing.     London Sheer, [place] [phone]

## 2016-06-17 ENCOUNTER — Encounter (HOSPITAL_COMMUNITY)
Admission: RE | Admit: 2016-06-17 | Discharge: 2016-06-17 | Disposition: A | Discharge status: Home / Self Care | Payer: Medicare Other | Source: Skilled Nursing Facility | Attending: Internal Medicine | Admitting: Internal Medicine

## 2016-06-17 DIAGNOSIS — I5032 Chronic diastolic (congestive) heart failure: Secondary | ICD-10-CM | POA: Diagnosis not present

## 2016-06-17 DIAGNOSIS — Z7901 Long term (current) use of anticoagulants: Secondary | ICD-10-CM | POA: Diagnosis not present

## 2016-06-17 LAB — BASIC METABOLIC PANEL
ANION GAP: 6 (ref 5–15)
BUN: 22 mg/dL — AB (ref 6–20)
CHLORIDE: 104 mmol/L (ref 101–111)
CO2: 29 mmol/L (ref 22–32)
Calcium: 8.5 mg/dL — ABNORMAL LOW (ref 8.9–10.3)
Creatinine, Ser: 1.03 mg/dL (ref 0.61–1.24)
GFR calc Af Amer: >60 mL/min (ref >60)
GFR calc non Af Amer: >60 mL/min (ref >60)
Glucose, Bld: 91 mg/dL (ref 65–99)
POTASSIUM: 4.3 mmol/L (ref 3.5–5.1)
SODIUM: 139 mmol/L (ref 135–145)

## 2016-06-17 LAB — CBC WITH DIFFERENTIAL/PLATELET
Basophils Absolute: 0 10*3/uL (ref 0.0–0.1)
Basophils Relative: 1 %
EOS ABS: 0.9 10*3/uL — AB (ref 0.0–0.7)
Eosinophils Relative: 13 %
HCT: 32.8 % — ABNORMAL LOW (ref 39.0–52.0)
HEMOGLOBIN: 10.3 g/dL — AB (ref 13.0–17.0)
LYMPHS ABS: 1.7 10*3/uL (ref 0.7–4.0)
LYMPHS PCT: 23 %
MCH: 27.6 pg (ref 26.0–34.0)
MCHC: 31.4 g/dL (ref 30.0–36.0)
MCV: 87.9 fL (ref 78.0–100.0)
Monocytes Absolute: 1.1 10*3/uL — ABNORMAL HIGH (ref 0.1–1.0)
Monocytes Relative: 16 %
Neutro Abs: 3.4 10*3/uL (ref 1.7–7.7)
Neutrophils Relative %: 47 %
Platelets: 240 10*3/uL (ref 150–400)
RBC: 3.73 MIL/uL — AB (ref 4.22–5.81)
RDW: 18.5 % — ABNORMAL HIGH (ref 11.5–15.5)
WBC: 7.1 10*3/uL (ref 4.0–10.5)

## 2016-06-17 LAB — PROTIME-INR
INR: 2.02
PROTHROMBIN TIME: 23.2 s — AB (ref 11.4–15.2)

## 2016-06-18 ENCOUNTER — Non-Acute Institutional Stay (SKILLED_NURSING_FACILITY): Payer: Medicare Other | Admitting: Internal Medicine

## 2016-06-18 ENCOUNTER — Encounter: Payer: Self-pay | Admitting: Internal Medicine

## 2016-06-18 DIAGNOSIS — H10403 Unspecified chronic conjunctivitis, bilateral: Secondary | ICD-10-CM

## 2016-06-18 DIAGNOSIS — I480 Paroxysmal atrial fibrillation: Secondary | ICD-10-CM | POA: Diagnosis not present

## 2016-06-18 NOTE — Progress Notes (Signed)
Location:   [facility] Room Number: 121/W Place of Service:  SNF [phone]) Provider:  Pollyann Savoy, MD  Patient Care Team: [name], MD as PCP - General (Internal Medicine) [name], MD as Attending Physician (Urology)  Extended Emergency Contact Information Primary Emergency Contact: [name],[name] Address: [address]          [place], [place] [address] [name] of [place] Home Phone: [phone] Mobile Phone: [phone] Relation: Daughter Secondary Emergency Contact: [place],[name] Address: [address]          [place], [place] [address] [name] of [place] Home Phone: [phone] Mobile Phone: [phone] Relation: Son  Code Status:  DNR Goals of care: Advanced Directive information Advanced Directives 06/18/2016  Does Patient Have a Medical Advance Directive? Yes  Type of Advance Directive Out of facility DNR (pink MOST or yellow form)  Does patient want to make changes to medical advance directive? No - Patient declined  Copy of Healthcare Power of Attorney in Chart? -  Would patient like information on creating a medical advance directive? -  Pre-existing out of facility DNR order (yellow form or pink MOST form) -     Chief Complaint  Patient presents with  . Acute Visit    Both eyes still red    HPI:  Pt is a 83 y.o. male seen today for an acute visit for Red eyes.  Patient has h/o Atrial fibrillation on chronic Coumadin Therapy., COPD, DVT, Dementia with Behavior problems, BPH with chronic Foley Cathter, Recurrent UTI on chronic Hiprex,chronic venous stasis with Ulcers. And aortic aneurysm  He had red eyes before and was on Tobramycin which was stopped few days ago. Nurses have noticed His eyes being red again with some discharge. Patient unable to tell any history due to dementia. His Augmentin Was also continued for few more days due to Continous Cough and green sputum.    Past Medical History:  Diagnosis  Date  . AAA (abdominal aortic aneurysm) (HCC)   . AAA (abdominal aortic aneurysm) without rupture (HCC) 10/06/2012  . Anticoagulation goal of INR 2 to 3 06/01/2013  . Arthritis    "probably on the right leg/hip" (09/20/2013)  . Atrial fibrillation (HCC)   . B12 deficiency 09/20/2013  . Benign localized hyperplasia of prostate with urinary retention 12/23/2014  . Bladder calculus 12/23/2014   2.6cm and present since at least 2011 when it was about 1.6cm.   [name] BPH (benign prostatic hyperplasia)   . Bronchospasm   . Cardiomyopathy (HCC) 10/15/2014  . Cellulitis   . CHF (congestive heart failure) (HCC) 09/26/2013  . Chronic venous insufficiency 10/06/2012  . Cognitive impairment   . COPD (chronic obstructive pulmonary disease) (HCC)   . Daily headache   . Dementia    "don't know exactly what kind" (09/20/2013)  . DVT (deep venous thrombosis) (HCC) 05/04/2013   "LLE"  . E. coli UTI 11/17/15   Cipro X 7 days  . Edema-Bilat Leg and foot 03/07/2013  . Elevated PSA   . Fall at home September 18, 2013  . Glaucoma, both eyes   . Kidney stones   . Multifactorial dementia 10/06/2012  . Nicotine dependence   . Obesity, unspecified 10/06/2012  . Peripheral vascular disease (HCC) 09/20/2012  . Pyelonephritis, acute 12/22/2014  . Ulcer (HCC)   . Venous insufficiency   . Weakness 09/18/2013  . Wound cellulitis    let ankle wound size of half dollar [facility] on 03/25/2015   Past Surgical History:  Procedure Laterality Date  . BACK SURGERY    . CATARACT EXTRACTION W/ INTRAOCULAR LENS  IMPLANT, BILATERAL Bilateral   . CHOLECYSTECTOMY  2011   Gall Bladder  . CYSTOSCOPY W/ STONE MANIPULATION  1970's   "once"  . CYSTOSCOPY WITH LITHOLAPAXY N/A 03/28/2015   Procedure: CYSTOSCOPY WITH LITHOLAPAXY;  Surgeon: Malen GauzePatrick L McKenzie, MD;  Location: [facility];  Service: Urology;  Laterality: N/A;  . EYE SURGERY    . FEMUR FRACTURE SURGERY Right 04/1985  . HIP FRACTURE SURGERY Right 2009  . INSITU  PERCUTANEOUS PINNING FEMUR Right 1986  . POSTERIOR LAMINECTOMY / DECOMPRESSION LUMBAR SPINE  2004   Decompressive laminectomy unilaterally on the right at L4-5 with  . SPINE SURGERY    . TRANSURETHRAL RESECTION OF PROSTATE N/A 03/28/2015   Procedure: TRANSURETHRAL RESECTION OF THE PROSTATE ;  Surgeon: Malen GauzePatrick L McKenzie, MD;  Location: [facility];  Service: Urology;  Laterality: N/A;    Allergies  Allergen Reactions  . Oxycodone Hcl Other (See Comments)     makes pt "wild"  . Propoxyphene N-Acetaminophen Other (See Comments)     Makes pt. "wild"    Current Outpatient Prescriptions on File Prior to Visit  Medication Sig Dispense Refill  . acetaminophen (TYLENOL) 500 MG tablet Take 1,000 mg by mouth every 6 (six) hours as needed for mild pain or fever.     . Amino Acids-Protein Hydrolys (FEEDING SUPPLEMENT, PRO-STAT SUGAR FREE 64,) LIQD Take 30 mLs by mouth 2 (two) times daily between meals.    [name]. amoxicillin-clavulanate (AUGMENTIN) 500-125 MG tablet Take 1 tablet by mouth 3 (three) times daily. Give for 7 days    . Balsam [place]-Castor Oil (VENELEX) OINT Apply to sacrum and bilateral buttocks q shift and prn prevention    . collagenase (SANTYL) ointment Apply daily to bilateral ankle wounds per tx order    . diltiazem (CARDIZEM CD) 120 MG 24 hr capsule Take 120 mg by mouth every morning.     . divalproex (DEPAKOTE) 250 MG DR tablet Take 250 mg by mouth 2 (two) times daily.     [name]. doxazosin (CARDURA) 8 MG tablet Take 8 mg by mouth every morning.     . ferrous sulfate (IRON SUPPLEMENT) 325 (65 FE) MG tablet Take 325 mg by mouth daily with breakfast.    . Fluticasone-Salmeterol (ADVAIR) 250-50 MCG/DOSE AEPB Inhale 1 puff into the lungs 2 (two) times daily.    [name]. ipratropium (ATROVENT) 0.02 % nebulizer solution Take 0.5 mg by nebulization 3 (three) times daily.     [name]. latanoprost (XALATAN) 0.005 % ophthalmic solution Place 1 drop into both eyes at bedtime.     . Magnesium Hydroxide (MILK OF MAGNESIA  PO) Give 30 ml by mouth daily as needed for constipation may use fleets enema    . methenamine (HIPREX) 1 g tablet Take 1 g by mouth 2 (two) times daily with a meal.    . metoprolol tartrate (LOPRESSOR) 25 MG tablet Take 1 tablet (25 mg total) by mouth 2 (two) times daily. 30 tablet 0  . sertraline (ZOLOFT) 25 MG tablet Take 75 mg by mouth daily. Take three tablets by mouth once daily    . triamcinolone cream (KENALOG) 0.1 % Triamcinolone 0.1 % and 1/2 Cetaphil cream apply thin layer to let /right leg before wrapping with coban once a day on Mon,Wed,Fri    . warfarin (COUMADIN) 1 MG tablet Take 0.5 mg along with 3  mg by mouth daily to equal 3.5 mg  On Mon,Wed,Fri    . warfarin (COUMADIN) 3 MG tablet Take 3 mg along with 0.5 mg to equal 3.5 mg daily On Mon,Wed,Fri    . warfarin (COUMADIN) 4 MG tablet Once a day on Sun, Tues,Thu, Sat give 4 mg at 5:00 PM     No current facility-administered medications on file prior to visit.      Review of Systems  Unable to perform ROS: Dementia    Immunization History  Administered Date(s) Administered  . Influenza,inj,Quad PF,36+ Mos 03/20/2013, 03/23/2014, 02/11/2015  . Influenza-Unspecified 03/05/2016  . PPD Test 12/26/2014, 01/09/2015  . Pneumococcal Polysaccharide-23 03/02/2007, 02/11/2015  . Pneumococcal-Unspecified 03/09/2016  . Td 12/30/1996   Pertinent  Health Maintenance Due  Topic Date Due  . PNA vac Low Risk Adult (2 of 2 - PCV13) 03/09/2017  . INFLUENZA VACCINE  Completed   Fall Risk  01/04/2015 04/18/2014 08/15/2013  Falls in the past year? Yes Yes Yes  Number falls in past yr: 2 or more 1 2 or more  Injury with Fall? No Yes -  Risk Factor Category  - High Fall Risk High Fall Risk  Risk for fall due to : Impaired balance/gait;Impaired mobility Impaired mobility;Mental status change History of fall(s);Impaired balance/gait;Impaired mobility;Mental status change   Functional Status Survey:    Vitals:   06/18/16 1308  BP: (!)  119/53  Pulse: 84  Resp: 20  Temp: 98.6 F (37 C)  TempSrc: Oral   There is no height or weight on file to calculate BMI. Physical Exam  Constitutional: He appears well-developed and well-nourished.  HENT:  Head: Normocephalic.  Eyes:  His eyes are actually not red. But he does have some discharge crusty around his both eyes.  Pulmonary/Chest: Effort normal and breath sounds normal. He has no wheezes.  Abdominal: Soft. Bowel sounds are normal. He exhibits no distension. There is no tenderness. There is no rebound.  Musculoskeletal: He exhibits edema.    Labs reviewed:  Recent Labs  03/30/16 0700  03/30/16 1640  05/07/16 0715 05/14/16 0622 06/17/16 0500  NA 142  < >  --   < > 144 141 139  K 4.0  < >  --   < > 3.8 3.8 4.3  CL 108  < >  --   < > 109 108 104  CO2 28  < >  --   < > 29 28 29   GLUCOSE 104*  < >  --   < > 94 102* 91  BUN 24*  < >  --   < > 22* 23* 22*  CREATININE 0.94  < >  --   < > 1.04 0.91 1.03  CALCIUM 8.7*  < >  --   < > 8.8* 8.6* 8.5*  MG 2.1  --  2.0  --   --   --   --   PHOS  --   --  2.8  --   --   --   --   < > = values in this interval not displayed.  Recent Labs  03/25/16 1240 03/30/16 1256 03/31/16 0627  AST 21 19 12*  ALT 13* 16* 13*  ALKPHOS 54 55 45  BILITOT 0.6 0.7 0.7  PROT 7.0 7.1 5.8*  ALBUMIN 3.1* 3.1* 2.5*    Recent Labs  04/12/2016 0615  05/07/16 0715 05/14/16 0622 06/17/16 0500  WBC 7.0  < > 6.7 9.1 7.1  NEUTROABS 3.5  --   --  4.7 3.4  HGB 9.2*  < > 10.2* 10.4* 10.3*  HCT 30.3*  < > 33.8* 33.9* 32.8*  MCV 89.4  < > 89.2 90.2 87.9  PLT 275  < > 281 254 240  < > = values in this interval not displayed. Lab Results  Component Value Date   TSH 1.960 09/22/2013   Lab Results  Component Value Date   HGBA1C 6.3 (H) 10/24/2015   Lab Results  Component Value Date   CHOL 157 10/06/2012   HDL 34 (L) 10/06/2012   LDLCALC 96 10/06/2012   TRIG 135 10/06/2012   CHOLHDL 4.6 10/06/2012    Significant Diagnostic Results  in last 30 days:  No results found.  Assessment/Plan  Chronic Conjunctivitis. Restart Tobramycin Antibiotics again. Will also follow it with some Artificial eye drops to keep his eyes moist.  Chronic Atrial fibrillation INR yesterday was 2.02 Continue same dose of Coumadin Follow in 1 week.   Family/ staff Communication:   Labs/tests ordered:

## 2016-06-19 ENCOUNTER — Encounter (HOSPITAL_COMMUNITY)
Admission: RE | Admit: 2016-06-19 | Discharge: 2016-06-19 | Disposition: A | Discharge status: Home / Self Care | Payer: Medicare Other | Source: Skilled Nursing Facility | Attending: Internal Medicine | Admitting: Internal Medicine

## 2016-06-19 ENCOUNTER — Encounter: Payer: Self-pay | Admitting: Internal Medicine

## 2016-06-19 ENCOUNTER — Non-Acute Institutional Stay (SKILLED_NURSING_FACILITY): Payer: Medicare Other | Admitting: Internal Medicine

## 2016-06-19 DIAGNOSIS — R627 Adult failure to thrive: Secondary | ICD-10-CM

## 2016-06-19 DIAGNOSIS — Z7901 Long term (current) use of anticoagulants: Secondary | ICD-10-CM | POA: Diagnosis not present

## 2016-06-19 DIAGNOSIS — R509 Fever, unspecified: Secondary | ICD-10-CM | POA: Diagnosis not present

## 2016-06-19 DIAGNOSIS — I5032 Chronic diastolic (congestive) heart failure: Secondary | ICD-10-CM | POA: Diagnosis not present

## 2016-06-19 LAB — COMPREHENSIVE METABOLIC PANEL
ALK PHOS: 51 U/L (ref 38–126)
ALT: 24 U/L (ref 17–63)
AST: 57 U/L — ABNORMAL HIGH (ref 15–41)
Albumin: 2.5 g/dL — ABNORMAL LOW (ref 3.5–5.0)
Anion gap: 6 (ref 5–15)
BUN: 20 mg/dL (ref 6–20)
CALCIUM: 8.3 mg/dL — AB (ref 8.9–10.3)
CO2: 28 mmol/L (ref 22–32)
CREATININE: 0.91 mg/dL (ref 0.61–1.24)
Chloride: 105 mmol/L (ref 101–111)
GFR calc non Af Amer: >60 mL/min (ref >60)
Glucose, Bld: 96 mg/dL (ref 65–99)
Potassium: 4 mmol/L (ref 3.5–5.1)
Sodium: 139 mmol/L (ref 135–145)
Total Bilirubin: 0.7 mg/dL (ref 0.3–1.2)
Total Protein: 6.1 g/dL — ABNORMAL LOW (ref 6.5–8.1)

## 2016-06-19 LAB — CBC WITH DIFFERENTIAL/PLATELET
Basophils Absolute: 0 10*3/uL (ref 0.0–0.1)
Basophils Relative: 0 %
Eosinophils Absolute: 0.3 10*3/uL (ref 0.0–0.7)
Eosinophils Relative: 3 %
HCT: 34.4 % — ABNORMAL LOW (ref 39.0–52.0)
Hemoglobin: 11.1 g/dL — ABNORMAL LOW (ref 13.0–17.0)
LYMPHS ABS: 1.6 10*3/uL (ref 0.7–4.0)
LYMPHS PCT: 18 %
MCH: 27.8 pg (ref 26.0–34.0)
MCHC: 32.3 g/dL (ref 30.0–36.0)
MCV: 86.2 fL (ref 78.0–100.0)
Monocytes Absolute: 1.3 10*3/uL — ABNORMAL HIGH (ref 0.1–1.0)
Monocytes Relative: 14 %
NEUTROS PCT: 65 %
Neutro Abs: 5.8 10*3/uL (ref 1.7–7.7)
Platelets: 245 10*3/uL (ref 150–400)
RBC: 3.99 MIL/uL — AB (ref 4.22–5.81)
RDW: 17.9 % — ABNORMAL HIGH (ref 11.5–15.5)
WBC: 9 10*3/uL (ref 4.0–10.5)

## 2016-06-19 LAB — INFLUENZA PANEL BY PCR (TYPE A & B)
INFLBPCR: NEGATIVE
Influenza A By PCR: NEGATIVE

## 2016-06-19 NOTE — Progress Notes (Signed)
Location:   [facility] Room Number: 121/W Place of Service:  SNF [phone]) Provider:  [place] Lewis  Virgie Dad, MD  Patient Care Team: Virgie Dad, MD as PCP - General (Internal Medicine) Irine Seal, MD as Attending Physician (Urology)  Extended Emergency Contact Information Primary Emergency Contact: [name],[name] Address: [address]          [place], [place] [address] [name] of [place] Phone: [phone] Mobile Phone: [phone] Relation: Daughter Secondary Emergency Contact: Meininger,[name] Address: [address]          [place], [place] [address] [name] of [place] Phone: [phone] Mobile Phone: [phone] Relation: Son  Code Status:  DNR Goals of care: Advanced Directive information Advanced Directives 06/19/2016  Does Patient Have a Medical Advance Directive? Yes  Type of Advance Directive Out of facility DNR (pink MOST or yellow form)  Does patient want to make changes to medical advance directive? No - Patient declined  Copy of [place] in Chart? -  Would patient like information on creating a medical advance directive? -  Pre-existing out of facility DNR order (yellow form or pink MOST form) -     Chief Complaint  Patient presents with  . Acute Visit    Running Temp  Fever of unknown origin  HPI:  Pt is a 83 y.o. male seen today for an acute visit for fever of unknown origin-staff has noted a temperature of 101.6 oral-he is being treated for a suspected URI with Augmentin in fact course was recently extended secondary to some continued cough.  He does have a history of-ablation on chronic Coumadin-COPD-history DVT-dementia-BPH with chronic indwelling Foley catheter and recurrent UTIs on chronic Hiprex-and chronic venous stasis ulcers as well as a history of aortic aneurysm  He has had significant decline here recently-and actually has developed a sacral wound which apparently over the last 48  hours has progressed significantly.  Patient does appear increasingly weak which has been a progression as well--- but today appears to have had increased deterioration.--Rhea Belton is talking basically in 1 word responses which has been a change.  Patient does have a MOST form in place which puts emphasis on comfort measures with no hospitalization unless comfort needs cannot be met here.  Limited antibiotics- no IV fluids no feeding tube  He actually  Had  labs done 2 days ago on January 17 which were fairly unremarkable white count was normal at 7.1 hemoglobin stable at 10.3 electrolytes appeared to be within normal range as well as kidney function with BUN mildly elevated at 22 creatinine was 1.03   Past Medical History:  Diagnosis Date  . AAA (abdominal aortic aneurysm) ([place])   . AAA (abdominal aortic aneurysm) without rupture ([place]) 10/06/2012  . Anticoagulation goal of INR 2 to 3 06/01/2013  . Arthritis    "probably on the right leg/hip" (09/20/2013)  . Atrial fibrillation ([place])   . B12 deficiency 09/20/2013  . Benign localized hyperplasia of prostate with urinary retention 12/23/2014  . Bladder calculus 12/23/2014   2.6cm and present since at least 2011 when it was about 1.6cm.   [name] BPH (benign prostatic hyperplasia)   . Bronchospasm   . Cardiomyopathy ([place]) 10/15/2014  . Cellulitis   . CHF (congestive heart failure) ([place]) 09/26/2013  . Chronic venous insufficiency 10/06/2012  . Cognitive impairment   . COPD (chronic obstructive pulmonary disease) ([place])   . Daily headache   . Dementia    "don't  know exactly what kind" (09/20/2013)  . DVT (deep venous thrombosis) ([place]) 05/04/2013   "LLE"  . E. coli UTI 11/17/15   Cipro X 7 days  . Edema-Bilat Leg and foot 03/07/2013  . Elevated PSA   . Fall at home September 18, 2013  . Glaucoma, both eyes   . Kidney stones   . Multifactorial dementia 10/06/2012  . Nicotine dependence   . Obesity, unspecified 10/06/2012  . Peripheral vascular disease  ([place]) 09/20/2012  . Pyelonephritis, acute 12/22/2014  . Ulcer ([place])   . Venous insufficiency   . Weakness 09/18/2013  . Wound cellulitis    let ankle wound size of half dollar per nurse at [facility] on 03/25/2015   Past Surgical History:  Procedure Laterality Date  . BACK SURGERY    . CATARACT EXTRACTION W/ INTRAOCULAR LENS  IMPLANT, BILATERAL Bilateral   . CHOLECYSTECTOMY  2011   Gall Bladder  . CYSTOSCOPY W/ STONE MANIPULATION  1970's   "once"  . CYSTOSCOPY WITH LITHOLAPAXY N/A 03/28/2015   Procedure: CYSTOSCOPY WITH LITHOLAPAXY;  Surgeon: Cleon Gustin, MD;  Location: [facility];  Service: Urology;  Laterality: N/A;  . EYE SURGERY    . FEMUR FRACTURE SURGERY Right 04/1985  . HIP FRACTURE SURGERY Right 2009  . INSITU PERCUTANEOUS PINNING FEMUR Right 1986  . POSTERIOR LAMINECTOMY / DECOMPRESSION LUMBAR SPINE  2004   Decompressive laminectomy unilaterally on the right at L4-5 with  . SPINE SURGERY    . TRANSURETHRAL RESECTION OF PROSTATE N/A 03/28/2015   Procedure: TRANSURETHRAL RESECTION OF THE PROSTATE ;  Surgeon: Cleon Gustin, MD;  Location: [facility];  Service: Urology;  Laterality: N/A;    Allergies  Allergen Reactions  . Oxycodone Hcl Other (See Comments)     makes pt "wild"  . Propoxyphene N-Acetaminophen Other (See Comments)     Makes pt. "wild"    Current Outpatient Prescriptions on File Prior to Visit  Medication Sig Dispense Refill  . acetaminophen (TYLENOL) 500 MG tablet Take 1,000 mg by mouth every 6 (six) hours as needed for mild pain or fever.     . Amino Acids-Protein Hydrolys (FEEDING SUPPLEMENT, PRO-STAT SUGAR FREE 64,) LIQD Take 30 mLs by mouth 2 (two) times daily between meals.    [name] amoxicillin-clavulanate (AUGMENTIN) 500-125 MG tablet Take 1 tablet by mouth 3 (three) times daily. Give for 7 days    . Balsam [place]-Castor Oil (VENELEX) OINT Apply to sacrum and bilateral buttocks q shift and prn prevention    . collagenase (SANTYL) ointment Apply  daily to bilateral ankle wounds per tx order    . diltiazem (CARDIZEM CD) 120 MG 24 hr capsule Take 120 mg by mouth every morning.     . divalproex (DEPAKOTE) 250 MG DR tablet Take 250 mg by mouth 2 (two) times daily.     [name] doxazosin (CARDURA) 8 MG tablet Take 8 mg by mouth every morning.     . ferrous sulfate (IRON SUPPLEMENT) 325 (65 FE) MG tablet Take 325 mg by mouth daily with breakfast.    . Fluticasone-Salmeterol (ADVAIR) 250-50 MCG/DOSE AEPB Inhale 1 puff into the lungs 2 (two) times daily.    [name] ipratropium (ATROVENT) 0.02 % nebulizer solution Take 0.5 mg by nebulization 3 (three) times daily.     [name] latanoprost (XALATAN) 0.005 % ophthalmic solution Place 1 drop into both eyes at bedtime.     . Magnesium Hydroxide (MILK OF MAGNESIA PO) Give 30 ml by mouth daily as needed  for constipation may use fleets enema    . methenamine (HIPREX) 1 g tablet Take 1 g by mouth 2 (two) times daily with a meal.    . metoprolol tartrate (LOPRESSOR) 25 MG tablet Take 1 tablet (25 mg total) by mouth 2 (two) times daily. 30 tablet 0  . sertraline (ZOLOFT) 25 MG tablet Take 75 mg by mouth daily. Take three tablets by mouth once daily    . triamcinolone cream (KENALOG) 0.1 % Triamcinolone 0.1 % and 1/2 Cetaphil cream apply thin layer to let /right leg before wrapping with coban once a day on Mon,Wed,Fri    . warfarin (COUMADIN) 1 MG tablet Take 0.5 mg along with 3 mg by mouth daily to equal 3.5 mg  On Mon,Wed,Fri    . warfarin (COUMADIN) 3 MG tablet Take 3 mg along with 0.5 mg to equal 3.5 mg daily On Mon,Wed,Fri    . warfarin (COUMADIN) 4 MG tablet Once a day on Sun, Tues,Thu, Sat give 4 mg at 5:00 PM     No current facility-administered medications on file prior to visit.      Review of Systems  Essentially unattainable since patient is a poor historian and is not speaking much today please see history of present illness-speaking with nursing staff and daughter feel he may be having some increased pain he is  only receiving Tylenol because that is  what he's wanted in the past although nursing staff and daughter feel at this point this is not as effective  Immunization History  Administered Date(s) Administered  . Influenza,inj,Quad PF,36+ Mos 03/20/2013, 03/23/2014, 02/11/2015  . Influenza-Unspecified 03/05/2016  . PPD Test 12/26/2014, 01/09/2015  . Pneumococcal Polysaccharide-23 03/02/2007, 02/11/2015  . Pneumococcal-Unspecified 03/09/2016  . Td 12/30/1996   Pertinent  Health Maintenance Due  Topic Date Due  . PNA vac Low Risk Adult (2 of 2 - PCV13) 03/09/2017  . INFLUENZA VACCINE  Completed   Fall Risk  01/04/2015 04/18/2014 08/15/2013  Falls in the past year? Yes Yes Yes  Number falls in past yr: 2 or more 1 2 or more  Injury with Fall? No Yes -  Risk Factor Category  - High Fall Risk High Fall Risk  Risk for fall due to : Impaired balance/gait;Impaired mobility Impaired mobility;Mental status change History of fall(s);Impaired balance/gait;Impaired mobility;Mental status change   Functional Status Survey:    Vitals:   06/19/16 1549  BP: 131/72  Pulse: 89  Resp: 18  Temp: (!) 101.6 F (38.7 C)  TempSrc: Oral   There is no height or weight on file to calculate BMI. Physical Exam   In general this is a frail appearing elderly male in no acute distress but appears increasingly weak.  Her skin is warm and dry his lower extremities are wrapped bilaterally he does have some chronic bruising.  I did assess his sacral wound this appears to again to have increased in size with some tenderness  Eyes-sclera and conjunctiva appear relatively clear he is receiving topical eyedrops does have some slight watery discharge.  Oropharynx is clear mucous membranes appear somewhat dry.  Chest he has poor respiratory effort but could not appreciate any labored breathing could not appreciate overt congestion except for some mild rhonchi.  Abdomen is obese soft does not appear to be acutely  tender.  Musculoskeletal does have lower extremity weakness moves other extremities appears at baseline although generally has some increased weakness and debility-  neurologic is difficult to fully assess since patient is quite weak  but I could not really appreciate lateralizing findings when he does speak it is relatively clear and at baseline  Psych-he is alert and responsive does answer questions although very short responses.      Labs reviewed:  Recent Labs  03/30/16 0700  03/30/16 1640  05/07/16 0715 05/14/16 0622 06/17/16 0500  NA 142  < >  --   < > 144 141 139  K 4.0  < >  --   < > 3.8 3.8 4.3  CL 108  < >  --   < > 109 108 104  CO2 28  < >  --   < > '29 28 29  '$ GLUCOSE 104*  < >  --   < > 94 102* 91  BUN 24*  < >  --   < > 22* 23* 22*  CREATININE 0.94  < >  --   < > 1.04 0.91 1.03  CALCIUM 8.7*  < >  --   < > 8.8* 8.6* 8.5*  MG 2.1  --  2.0  --   --   --   --   PHOS  --   --  2.8  --   --   --   --   < > = values in this interval not displayed.  Recent Labs  03/25/16 1240 03/30/16 1256 03/31/16 0627  AST 21 19 12*  ALT 13* 16* 13*  ALKPHOS 54 55 45  BILITOT 0.6 0.7 0.7  PROT 7.0 7.1 5.8*  ALBUMIN 3.1* 3.1* 2.5*    Recent Labs  04/30/2016 0615  05/07/16 0715 05/14/16 0622 06/17/16 0500  WBC 7.0  < > 6.7 9.1 7.1  NEUTROABS 3.5  --   --  4.7 3.4  HGB 9.2*  < > 10.2* 10.4* 10.3*  HCT 30.3*  < > 33.8* 33.9* 32.8*  MCV 89.4  < > 89.2 90.2 87.9  PLT 275  < > 281 254 240  < > = values in this interval not displayed. Lab Results  Component Value Date   TSH 1.960 09/22/2013   Lab Results  Component Value Date   HGBA1C 6.3 (H) 10/24/2015   Lab Results  Component Value Date   CHOL 157 10/06/2012   HDL 34 (L) 10/06/2012   LDLCALC 96 10/06/2012   TRIG 135 10/06/2012   CHOLHDL 4.6 10/06/2012    Significant Diagnostic Results in last 30 days:  No results found.  Assessment/Plan #1 fever of unknown origin-I did have a discussion with patient's  daughter via phone-she did reiterate wishes for more comfort measures-no hospitalization-at this point she is comfortable with ordering stat labs including a CBC and metabolic panel-also will obtain a flu swab-Will with hold however getting further investigational studies chest x-ray etc. until lab results are obtained and I discuss this further with her possibly in the facility when she arrives.  Also will add Norco 05-325  milligrams every 4 hours as needed for pain since Tylenol does not appear to be that effective anymore. Per his daughter he does not do well with certain pain medications and appears he does have an oxycodone listed allergy   Also monitor vital signs with pulse ox every 4 hours for now.  He is finishing a course of Augmentin for suspected URI he is on Coumadin INRs therapeutic at 2.02 on lab done 06-17-16-- updated INR has been ordered    Addendum we have obtained the updated lab work which actually shows stability sodium of 139 potassium  4 BUN 20 creatinine 0.91 albumin is 2.5 AST is minimally elevated at 57 otherwise liver function tests within normal limits  White count is within normal range at 9 hemoglobin show stability 11.1 platelets are 245,000  I did speak with his daughter who has arrived at facility at this point again will continue to monitor will await results of flu swab would not pursue any more aggressive interventions at this time.  [id number] note greater than 40 minutes spent assessing patient-reassessing patient-reviewing his chart-his labs-discussing with his daughter over the phone as well as at the facility-in coordinating and formulating a plan of care-of note greater than 50% of time spent coordinating plan of care      Oralia Manis, [place]

## 2016-06-21 ENCOUNTER — Encounter (HOSPITAL_COMMUNITY)
Admission: RE | Admit: 2016-06-21 | Discharge: 2016-06-21 | Disposition: A | Discharge status: Home / Self Care | Payer: Medicare Other | Source: Skilled Nursing Facility | Attending: Internal Medicine | Admitting: Internal Medicine

## 2016-06-21 DIAGNOSIS — I10 Essential (primary) hypertension: Secondary | ICD-10-CM | POA: Diagnosis not present

## 2016-06-21 LAB — CBC WITH DIFFERENTIAL/PLATELET
BASOS ABS: 0 10*3/uL (ref 0.0–0.1)
BASOS PCT: 1 %
EOS ABS: 0.4 10*3/uL (ref 0.0–0.7)
Eosinophils Relative: 4 %
HCT: 32.5 % — ABNORMAL LOW (ref 39.0–52.0)
Hemoglobin: 10.5 g/dL — ABNORMAL LOW (ref 13.0–17.0)
Lymphocytes Relative: 19 %
Lymphs Abs: 1.6 10*3/uL (ref 0.7–4.0)
MCH: 28 pg (ref 26.0–34.0)
MCHC: 32.3 g/dL (ref 30.0–36.0)
MCV: 86.7 fL (ref 78.0–100.0)
MONO ABS: 1.2 10*3/uL — AB (ref 0.1–1.0)
MONOS PCT: 14 %
NEUTROS ABS: 5.2 10*3/uL (ref 1.7–7.7)
Neutrophils Relative %: 62 %
PLATELETS: 228 10*3/uL (ref 150–400)
RBC: 3.75 MIL/uL — ABNORMAL LOW (ref 4.22–5.81)
RDW: 17.9 % — AB (ref 11.5–15.5)
WBC: 8.3 10*3/uL (ref 4.0–10.5)

## 2016-06-21 LAB — BASIC METABOLIC PANEL
ANION GAP: 6 (ref 5–15)
BUN: 22 mg/dL — ABNORMAL HIGH (ref 6–20)
CO2: 27 mmol/L (ref 22–32)
CREATININE: 0.85 mg/dL (ref 0.61–1.24)
Calcium: 8.2 mg/dL — ABNORMAL LOW (ref 8.9–10.3)
Chloride: 105 mmol/L (ref 101–111)
GFR calc non Af Amer: >60 mL/min (ref >60)
GLUCOSE: 99 mg/dL (ref 65–99)
Potassium: 4.3 mmol/L (ref 3.5–5.1)
Sodium: 138 mmol/L (ref 135–145)

## 2016-06-22 ENCOUNTER — Encounter: Payer: Self-pay | Admitting: Internal Medicine

## 2016-06-22 ENCOUNTER — Non-Acute Institutional Stay (SKILLED_NURSING_FACILITY): Payer: Medicare Other | Admitting: Internal Medicine

## 2016-06-22 DIAGNOSIS — R627 Adult failure to thrive: Secondary | ICD-10-CM

## 2016-06-22 DIAGNOSIS — G309 Alzheimer's disease, unspecified: Secondary | ICD-10-CM

## 2016-06-22 DIAGNOSIS — R509 Fever, unspecified: Secondary | ICD-10-CM | POA: Diagnosis not present

## 2016-06-22 DIAGNOSIS — F0280 Dementia in other diseases classified elsewhere without behavioral disturbance: Secondary | ICD-10-CM | POA: Diagnosis not present

## 2016-06-22 DIAGNOSIS — I509 Heart failure, unspecified: Secondary | ICD-10-CM | POA: Diagnosis not present

## 2016-06-22 NOTE — Progress Notes (Signed)
Location:   [facility] Room Number: 121/W Place of Service:  SNF [phone]) Provider:  Granville Lewis  Virgie Dad, MD  Patient Care Team: Virgie Dad, MD as PCP - General (Internal Medicine) Irine Seal, MD as Attending Physician (Urology)  Extended Emergency Contact Information Primary Emergency Contact: [name],[name] Address: [address]          [place], [place] [address] [name] of [place] Phone: [phone] Mobile Phone: [phone] Relation: Daughter Secondary Emergency Contact: Wilmeth,[name] Address: [address]          [place], [place] [address] [name] of [place] Phone: [phone] Mobile Phone: [phone] Relation: Son  Code Status:  DNR Goals of care: Advanced Directive information Advanced Directives 06/22/2016  Does Patient Have a Medical Advance Directive? Yes  Type of Advance Directive Out of facility DNR (pink MOST or yellow form)  Does patient want to make changes to medical advance directive? No - Patient declined  Copy of [place] in Chart? -  Would patient like information on creating a medical advance directive? -  Pre-existing out of facility DNR order (yellow form or pink MOST form) -     Chief Complaint  Patient presents with  . Acute Visit  For follow-up of fever-as well as failure to thrive  HPI:  Pt is a 83 y.o. male seen today for an acute visit for follow up of fever as well as failure to thrive He does have a history of intermittent fever here over the past several days had been treated for suspected URI with Augmentin and did complete a prolonged course of that this weekend.  He has a significant history of failure to thrive and declined here recently and actually has developed a sacral wound as well over the past several days.  He's been increasingly weak with a sporadic appetite.  Patient does have a MOST form in place which puts emphasis on comfort measures with no  hospitalization unless comfort needs cannot be met here.  He actually had labs done this weekend which were fairly unremarkable white count was not elevated hemoglobin was stable electrolytes were fairly within normal range.  Today he appears increasingly weak however occasionally diaphoretic-is talking but not much he is responsive.  Vital signs do show an axillary temperature of 99.3-O2 saturation is 91% on room air.  He does have a history of dementia which appears to be progressing along numerous medical issues including atrial fibrillation he is on chronic Coumadi Congestive heart failure-as well as chronic venous stasis and COPD    Past Medical History:  Diagnosis Date  . AAA (abdominal aortic aneurysm) ([place])   . AAA (abdominal aortic aneurysm) without rupture ([place]) 10/06/2012  . Anticoagulation goal of INR 2 to 3 06/01/2013  . Arthritis    "probably on the right leg/hip" (09/20/2013)  . Atrial fibrillation ([place])   . B12 deficiency 09/20/2013  . Benign localized hyperplasia of prostate with urinary retention 12/23/2014  . Bladder calculus 12/23/2014   2.6cm and present since at least 2011 when it was about 1.6cm.   [name] BPH (benign prostatic hyperplasia)   . Bronchospasm   . Cardiomyopathy ([place]) 10/15/2014  . Cellulitis   . CHF (congestive heart failure) ([place]) 09/26/2013  . Chronic venous insufficiency 10/06/2012  . Cognitive impairment   . COPD (chronic obstructive pulmonary disease) ([place])   . Daily headache   . Dementia    "don't know exactly what kind" (09/20/2013)  .  DVT (deep venous thrombosis) ([facility]) 05/04/2013   "LLE"  . E. coli UTI 11/17/15   Cipro X 7 days  . Edema-Bilat Leg and foot 03/07/2013  . Elevated PSA   . Fall at home September 18, 2013  . Glaucoma, both eyes   . Kidney stones   . Multifactorial dementia 10/06/2012  . Nicotine dependence   . Obesity, unspecified 10/06/2012  . Peripheral vascular disease ([place]) 09/20/2012  . Pyelonephritis, acute 12/22/2014  . Ulcer ([place])     . Venous insufficiency   . Weakness 09/18/2013  . Wound cellulitis    let ankle wound size of half dollar per nurse at [facility] on 03/25/2015   Past Surgical History:  Procedure Laterality Date  . BACK SURGERY    . CATARACT EXTRACTION W/ INTRAOCULAR LENS  IMPLANT, BILATERAL Bilateral   . CHOLECYSTECTOMY  2011   Gall Bladder  . CYSTOSCOPY W/ STONE MANIPULATION  1970's   "once"  . CYSTOSCOPY WITH LITHOLAPAXY N/A 03/28/2015   Procedure: CYSTOSCOPY WITH LITHOLAPAXY;  Surgeon: Cleon Gustin, MD;  Location: [facility];  Service: Urology;  Laterality: N/A;  . EYE SURGERY    . FEMUR FRACTURE SURGERY Right 04/1985  . HIP FRACTURE SURGERY Right 2009  . INSITU PERCUTANEOUS PINNING FEMUR Right 1986  . POSTERIOR LAMINECTOMY / DECOMPRESSION LUMBAR SPINE  2004   Decompressive laminectomy unilaterally on the right at L4-5 with  . SPINE SURGERY    . TRANSURETHRAL RESECTION OF PROSTATE N/A 03/28/2015   Procedure: TRANSURETHRAL RESECTION OF THE PROSTATE ;  Surgeon: Cleon Gustin, MD;  Location: [facility];  Service: Urology;  Laterality: N/A;    Allergies  Allergen Reactions  . Oxycodone Hcl Other (See Comments)     makes pt "wild"  . Propoxyphene N-Acetaminophen Other (See Comments)     Makes pt. "wild"    Current Outpatient Prescriptions on File Prior to Visit  Medication Sig Dispense Refill  . acetaminophen (TYLENOL) 500 MG tablet Take 1,000 mg by mouth every 6 (six) hours as needed for mild pain or fever.     . Amino Acids-Protein Hydrolys (FEEDING SUPPLEMENT, PRO-STAT SUGAR FREE 64,) LIQD Take 30 mLs by mouth 2 (two) times daily between meals.    Roseanne Kaufman [place]-Castor Oil (VENELEX) OINT Apply to sacrum and bilateral buttocks q shift and prn prevention    . collagenase (SANTYL) ointment Apply daily to bilateral ankle wounds per tx order    . diltiazem (CARDIZEM CD) 120 MG 24 hr capsule Take 120 mg by mouth every morning.     . divalproex (DEPAKOTE) 250 MG DR tablet Take 250 mg  by mouth 2 (two) times daily.     [name] doxazosin (CARDURA) 8 MG tablet Take 8 mg by mouth every morning.     . ferrous sulfate (IRON SUPPLEMENT) 325 (65 FE) MG tablet Take 325 mg by mouth daily with breakfast.    . Fluticasone-Salmeterol (ADVAIR) 250-50 MCG/DOSE AEPB Inhale 1 puff into the lungs 2 (two) times daily.    [name] ipratropium (ATROVENT) 0.02 % nebulizer solution Take 0.5 mg by nebulization 3 (three) times daily.     [name] latanoprost (XALATAN) 0.005 % ophthalmic solution Place 1 drop into both eyes at bedtime.     . Magnesium Hydroxide (MILK OF MAGNESIA PO) Give 30 ml by mouth daily as needed for constipation may use fleets enema    . methenamine (HIPREX) 1 g tablet Take 1 g by mouth 2 (two) times daily with a meal.    .  metoprolol tartrate (LOPRESSOR) 25 MG tablet Take 1 tablet (25 mg total) by mouth 2 (two) times daily. 30 tablet 0  . polyvinyl alcohol (LIQUIFILM TEARS) 1.4 % ophthalmic solution Place 1 drop into both eyes every 6 (six) hours.    . sertraline (ZOLOFT) 25 MG tablet Take 75 mg by mouth daily. Take three tablets by mouth once daily    . tobramycin (TOBREX) 0.3 % ophthalmic solution Place 1 drop into both eyes every 4 (four) hours.    . triamcinolone cream (KENALOG) 0.1 % Triamcinolone 0.1 % and 1/2 Cetaphil cream apply thin layer to let /right leg before wrapping with coban once a day on Mon,Wed,Fri    . warfarin (COUMADIN) 1 MG tablet Take 0.5 mg along with 3 mg by mouth daily to equal 3.5 mg  On Mon,Wed,Fri    . warfarin (COUMADIN) 3 MG tablet Take 3 mg along with 0.5 mg to equal 3.5 mg daily On Mon,Wed,Fri    . warfarin (COUMADIN) 4 MG tablet Once a day on Sun, Tues,Thu, Sat give 4 mg at 5:00 PM     No current facility-administered medications on file prior to visit.      Review of Systems   Essentially unattainable when asked the patient is hurting he says yes although difficult to localize-has been receiving Norco as needed-  Immunization History  Administered Date(s)  Administered  . Influenza,inj,Quad PF,36+ Mos 03/20/2013, 03/23/2014, 02/11/2015  . Influenza-Unspecified 03/05/2016  . PPD Test 12/26/2014, 01/09/2015  . Pneumococcal Polysaccharide-23 03/02/2007, 02/11/2015  . Pneumococcal-Unspecified 03/09/2016  . Td 12/30/1996   Pertinent  Health Maintenance Due  Topic Date Due  . PNA vac Low Risk Adult (2 of 2 - PCV13) 03/09/2017  . INFLUENZA VACCINE  Completed   Fall Risk  01/04/2015 04/18/2014 08/15/2013  Falls in the past year? Yes Yes Yes  Number falls in past yr: 2 or more 1 2 or more  Injury with Fall? No Yes -  Risk Factor Category  - High Fall Risk High Fall Risk  Risk for fall due to : Impaired balance/gait;Impaired mobility Impaired mobility;Mental status change History of fall(s);Impaired balance/gait;Impaired mobility;Mental status change   Functional Status Survey:    Vitals:   06/22/16 1437  BP: (!) 146/71  Pulse: (!) 59  Resp: 20  Temp: 98.9 F (37.2 C)  TempSrc: Oral   Of note update her blood pressure is 107/38-axillary temperature is 99.3 O2 saturation is 91% on room air Physical Exam   In general this is a frail appearing elderly male lying in bed he does not appear to be in any acute distress but appears increasingly weak.  His skin is mildly diaphoretic lower extremities are wrapped bilaterally-he does have venous stasis.  Sacral wound was assessed by nursing today apparently this has shown some progression.  Oropharynx is clear mucous membranes appear somewhat dry.  Chest has some scattered congestion there is no labored breathing or sign of increased respiratory effort.  Abdomen is obese soft does not appear to be tender there are positive bowel sounds.  Musculoskeletal has increased weakness has had baseline lower extremity weakness has some increased weakness upper extremities but able to move both.  Neurologic difficult exam since patient continues to be quite weak but I do not see lateralizing findings  when he speaks it is fairly clear at baseline.  Psych he is alert responsive but appears very weak-answers usually in 1 word responses  Labs reviewed:  Recent Labs  03/30/16 0700  03/30/16 1640  06/17/16 0500 06/19/16 1600 06/21/16 0605  NA 142  < >  --   < > 139 139 138  K 4.0  < >  --   < > 4.3 4.0 4.3  CL 108  < >  --   < > 104 105 105  CO2 28  < >  --   < > '29 28 27  '$ GLUCOSE 104*  < >  --   < > 91 96 99  BUN 24*  < >  --   < > 22* 20 22*  CREATININE 0.94  < >  --   < > 1.03 0.91 0.85  CALCIUM 8.7*  < >  --   < > 8.5* 8.3* 8.2*  MG 2.1  --  2.0  --   --   --   --   PHOS  --   --  2.8  --   --   --   --   < > = values in this interval not displayed.  Recent Labs  03/30/16 1256 03/31/16 0627 06/19/16 1600  AST 19 12* 57*  ALT 16* 13* 24  ALKPHOS 55 45 51  BILITOT 0.7 0.7 0.7  PROT 7.1 5.8* 6.1*  ALBUMIN 3.1* 2.5* 2.5*    Recent Labs  06/17/16 0500 06/19/16 1600 06/21/16 0605  WBC 7.1 9.0 8.3  NEUTROABS 3.4 5.8 5.2  HGB 10.3* 11.1* 10.5*  HCT 32.8* 34.4* 32.5*  MCV 87.9 86.2 86.7  PLT 240 245 228   Lab Results  Component Value Date   TSH 1.960 09/22/2013   Lab Results  Component Value Date   HGBA1C 6.3 (H) 10/24/2015   Lab Results  Component Value Date   CHOL 157 10/06/2012   HDL 34 (L) 10/06/2012   LDLCALC 96 10/06/2012   TRIG 135 10/06/2012   CHOLHDL 4.6 10/06/2012    Significant Diagnostic Results in last 30 days:  No results found.  Assessment/Plan  #1 fever of unknown origin-failure to thrive-I did discuss patient's status with his daughter via phone and later in the facility-we have ordered a hospice consult and actually by the end of the day hospice had arrived.  Family does not want aggressive intervention essentially hospice care comfort care-.  There've been concerns possibly the patient is in pain Norco was prescribed starting this weekend although unclear how often he is receiving this-will await hospice consultation and if Norco  not thought to be effective certainly will consider alternatives apparently has had issues with stronger pain meds in the past. Also will await hospice consultation about continuing his meds apparently he is still taking them by mouth but I suspect this will change in the near future  At this point would not order further labs -- labs this weekend and were fairly unremarkable-patient does have a very poor prognosis family is aware and basically wants him Comfortable.  Apparently there are discussions about moving him to hospice house but apparently a bed is not available currently-at this point will treat in-house  Continue to monitor for any signs of pain or discomfort-will continue vital signs every shift for now.  [place], [place], [place]

## 2016-06-23 ENCOUNTER — Encounter (HOSPITAL_COMMUNITY)
Admission: RE | Admit: 2016-06-23 | Discharge: 2016-06-23 | Disposition: A | Discharge status: Home / Self Care | Payer: Medicare Other | Source: Skilled Nursing Facility | Attending: Internal Medicine | Admitting: Internal Medicine

## 2016-06-23 DIAGNOSIS — I509 Heart failure, unspecified: Secondary | ICD-10-CM | POA: Diagnosis not present

## 2016-06-23 DIAGNOSIS — F0391 Unspecified dementia with behavioral disturbance: Secondary | ICD-10-CM | POA: Insufficient documentation

## 2016-06-23 DIAGNOSIS — R293 Abnormal posture: Secondary | ICD-10-CM | POA: Insufficient documentation

## 2016-06-23 DIAGNOSIS — Z7901 Long term (current) use of anticoagulants: Secondary | ICD-10-CM | POA: Insufficient documentation

## 2016-06-23 DIAGNOSIS — J189 Pneumonia, unspecified organism: Secondary | ICD-10-CM | POA: Insufficient documentation

## 2016-06-23 DIAGNOSIS — R1312 Dysphagia, oropharyngeal phase: Secondary | ICD-10-CM | POA: Insufficient documentation

## 2016-06-23 DIAGNOSIS — I5032 Chronic diastolic (congestive) heart failure: Secondary | ICD-10-CM | POA: Insufficient documentation

## 2016-06-24 DIAGNOSIS — I509 Heart failure, unspecified: Secondary | ICD-10-CM | POA: Diagnosis not present

## 2016-06-25 DIAGNOSIS — I509 Heart failure, unspecified: Secondary | ICD-10-CM | POA: Diagnosis not present

## 2016-06-26 DIAGNOSIS — I509 Heart failure, unspecified: Secondary | ICD-10-CM | POA: Diagnosis not present

## 2016-06-27 DIAGNOSIS — I509 Heart failure, unspecified: Secondary | ICD-10-CM | POA: Diagnosis not present

## 2016-06-28 DIAGNOSIS — I509 Heart failure, unspecified: Secondary | ICD-10-CM | POA: Diagnosis not present

## 2016-06-29 DIAGNOSIS — I509 Heart failure, unspecified: Secondary | ICD-10-CM | POA: Diagnosis not present

## 2016-06-30 ENCOUNTER — Other Ambulatory Visit: Payer: Self-pay | Admitting: *Deleted

## 2016-06-30 DIAGNOSIS — I509 Heart failure, unspecified: Secondary | ICD-10-CM | POA: Diagnosis not present

## 2016-06-30 MED ORDER — LORAZEPAM 2 MG/ML PO CONC
ORAL | 5 refills | Status: DC
Start: 2016-06-30 — End: 2016-07-02

## 2016-06-30 NOTE — Telephone Encounter (Signed)
[facility] #[phone] Fax: [phone]   

## 2016-07-01 DIAGNOSIS — I509 Heart failure, unspecified: Secondary | ICD-10-CM | POA: Diagnosis not present

## 2016-07-02 ENCOUNTER — Other Ambulatory Visit: Payer: Self-pay

## 2016-07-02 MED ORDER — LORAZEPAM 2 MG/ML PO CONC: ORAL | 5 refills | Status: AC

## 2016-07-02 NOTE — Telephone Encounter (Signed)
RX faxed to [facility] @ [phone]. Phone number [phone]  

## 2016-07-30 DEATH — deceased

## 2017-03-22 IMAGING — CR DG CHEST 1V
1 series · 1 of 1 positions shown · non-contrast
Comparison: CT 03/19/2014 .

CLINICAL DATA: Chest congestion.

EXAM:
CHEST  1 VIEW

[view not recorded]
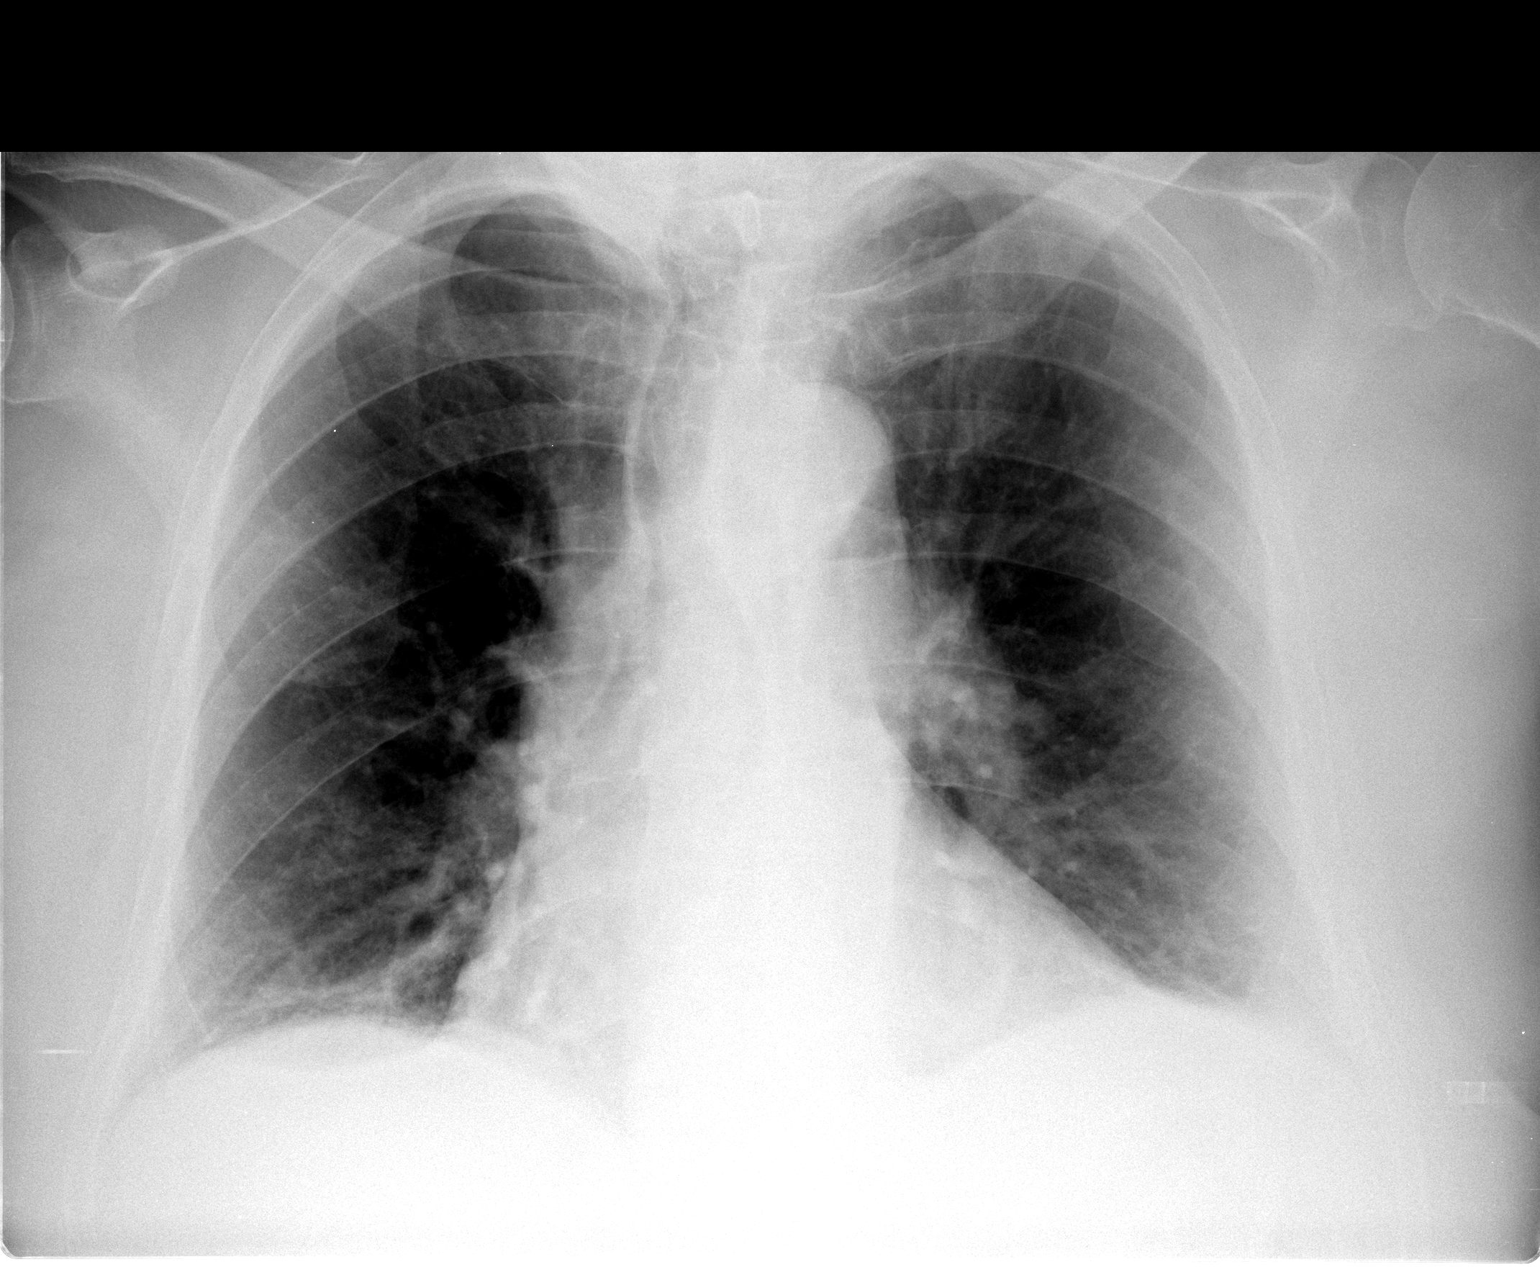

[1 of 1 positions shown; findings below may reference images not displayed]

FINDINGS: Mediastinum and hilar structures normal. Cardiomegaly with normal
pulmonary vascularity. Bibasilar atelectasis and/or mild
infiltrates. No pleural effusion or pneumothorax.
IMPRESSION: 1.  Mild bibasilar subsegmental atelectasis and/or infiltrates.

2.  Mild cardiomegaly, no pulmonary venous congestion.

## 2017-09-01 IMAGING — CR DG CHEST 1V PORT
1 series · 1 of 1 positions shown · non-contrast
Comparison: 01/11/2015

CLINICAL DATA: Right lower quadrant abdominal pain, cough and fever

EXAM:
PORTABLE CHEST - 1 VIEW

[ap portable]
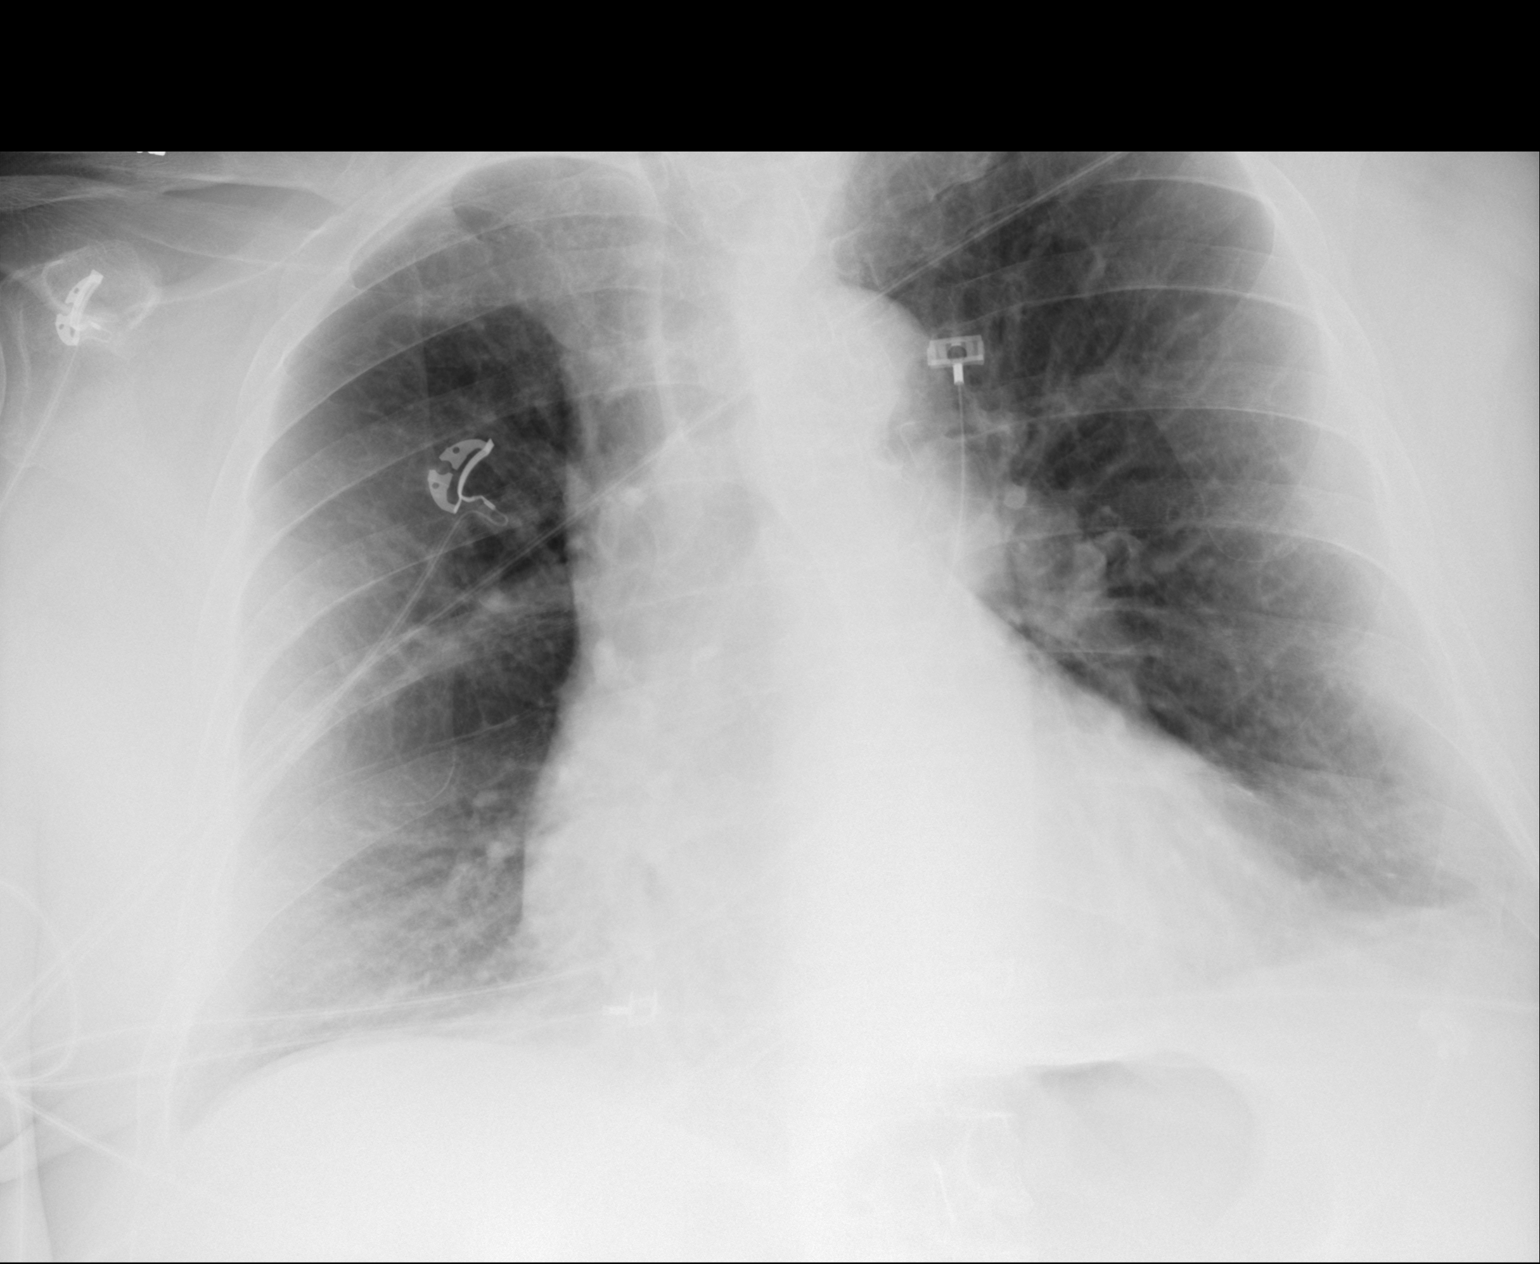

[1 of 1 positions shown; findings below may reference images not displayed]

FINDINGS: Moderate enlargement of the cardiac silhouette is noted. No focal
pulmonary opacity. Lungs are hypoaerated with crowding of the
bronchovascular markings. Trace if any pleural fluid.
IMPRESSION: Suboptimal visualization due to patient positioning and portable
technique. Cardiomegaly without focal acute finding. If symptoms
persist, consider PA and lateral chest radiographs obtained at full
inspiration when the patient is clinically able.

## 2018-09-08 IMAGING — DX DG CHEST 2V
2 series · 2 of 2 positions shown · non-contrast
Comparison: September 08, 2015

CLINICAL DATA: Fatigue and weakness.  Recent pneumonia

EXAM:
CHEST  2 VIEW

[chest ap]
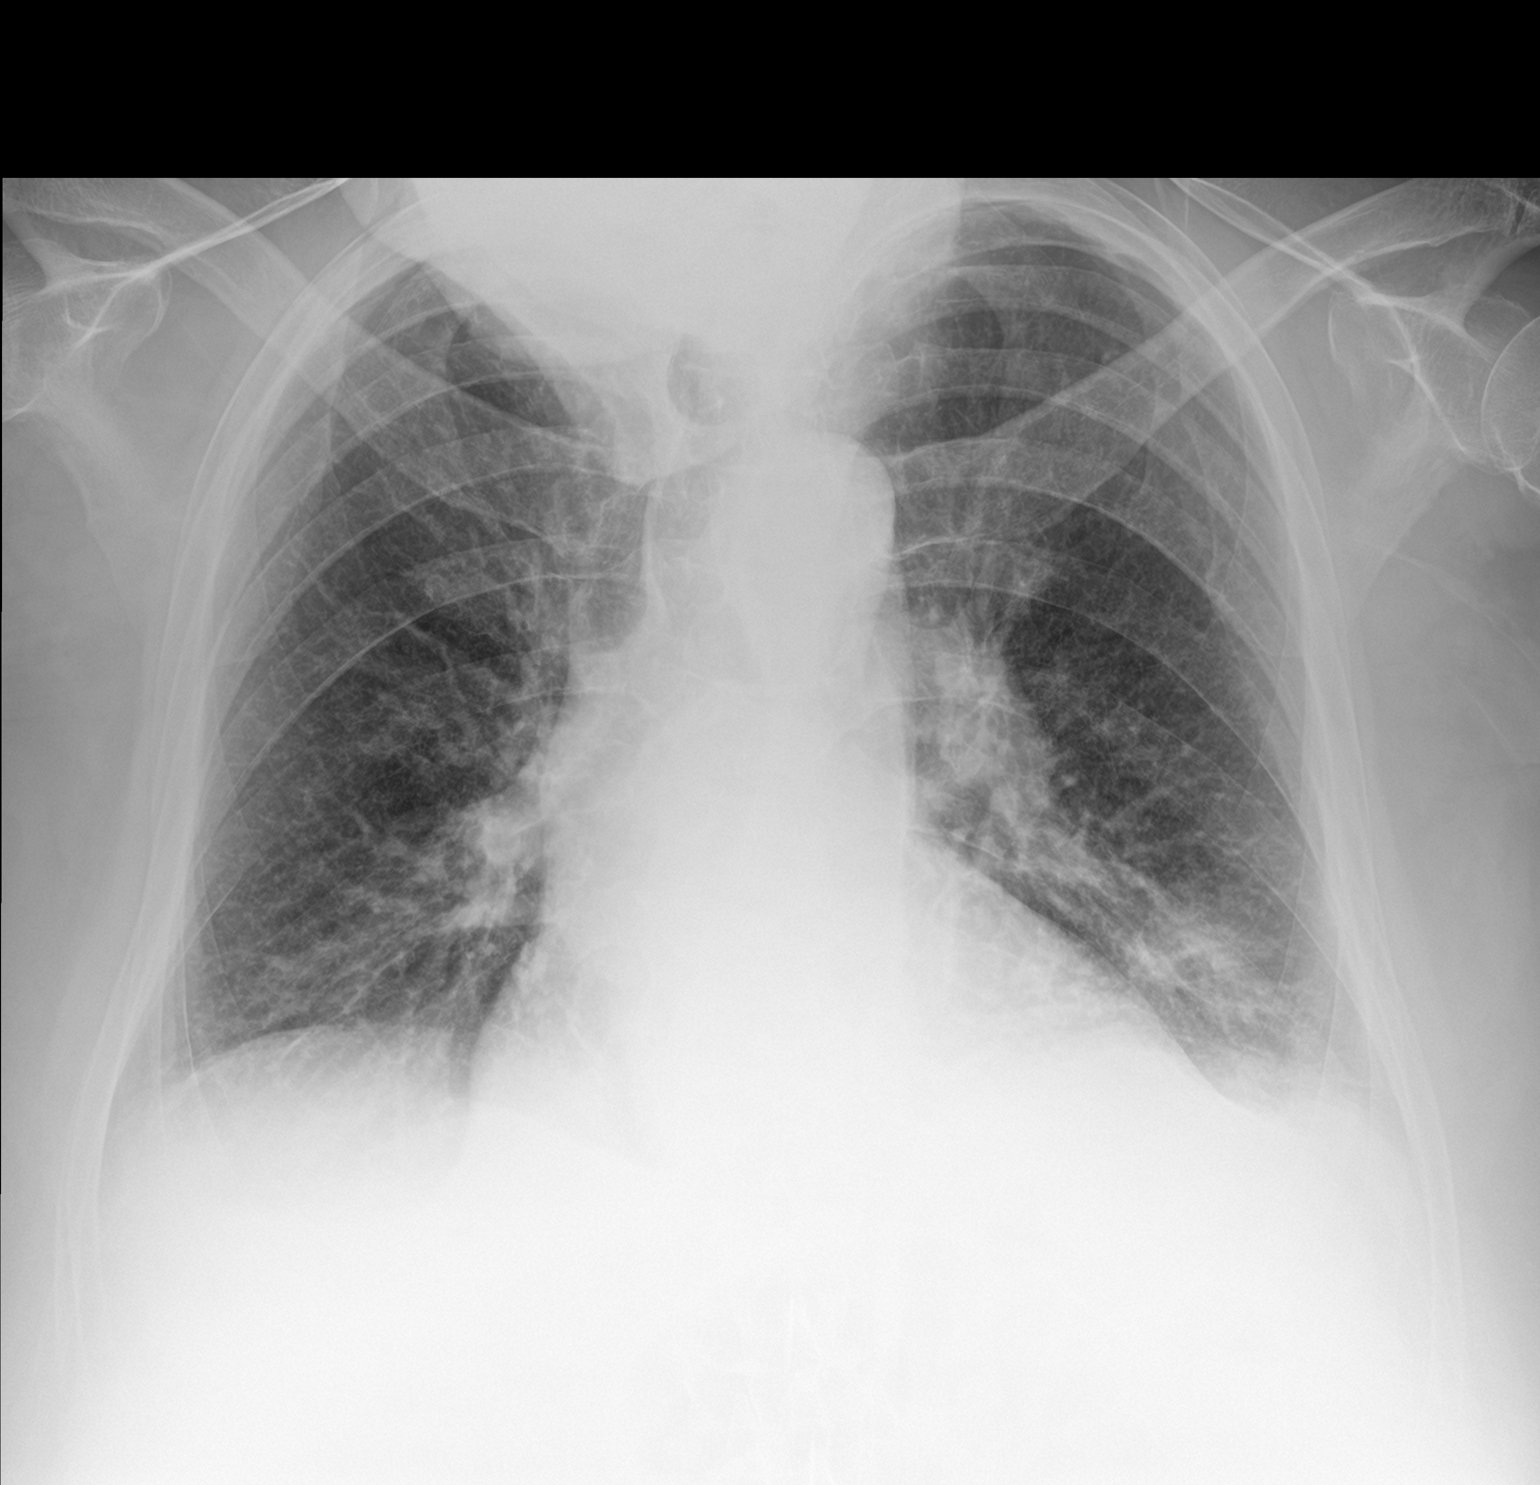

[chest lat]
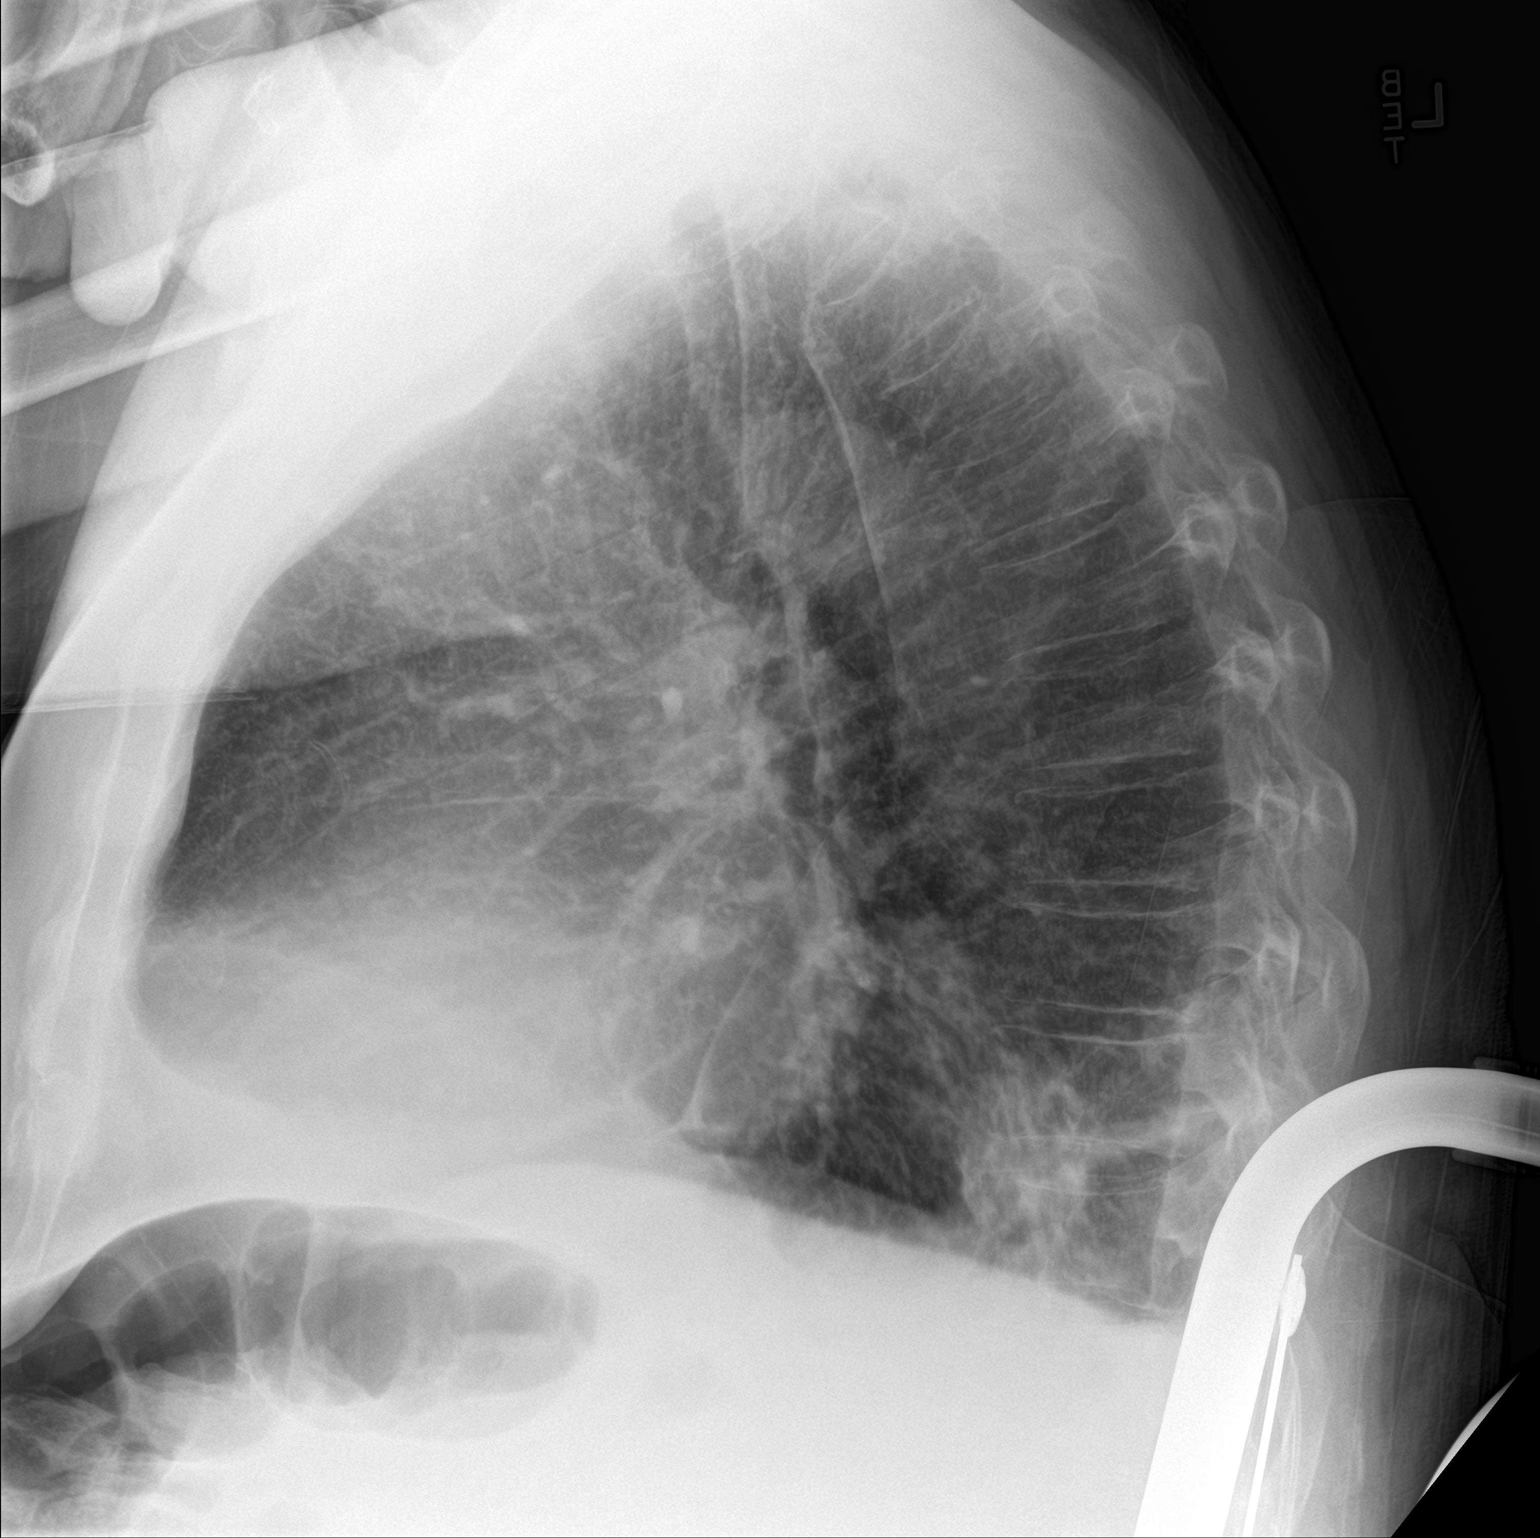

[2 of 2 positions shown; findings below may reference images not displayed]

FINDINGS: There is focal airspace consolidation in the posterior left base
region. The lungs elsewhere clear. Heart is borderline enlarged with
pulmonary vascularity within normal limits. No adenopathy. There are
no bone lesions.
IMPRESSION: Focal infiltrate consistent with pneumonia posterior left base.
Lungs otherwise clear. Mild cardiomegaly.

Followup PA and lateral chest radiographs recommended in 3-4 weeks
following trial of antibiotic therapy to ensure resolution and
exclude underlying malignancy.

## 2018-10-20 IMAGING — CT CT HEAD W/O CM
3 series · 15 of 47 positions shown, 18 images · non-contrast
Comparison: None.

CLINICAL DATA: altered mental status.

EXAM:
CT HEAD WITHOUT CONTRAST
TECHNIQUE: Contiguous axial images were obtained from the base of the skull
through the vertex without intravenous contrast.

[Series 2: head trauma wo · axial · 0.48mm/px · z∈[+1312,+1457]mm · 9 of 35 slices shown, 12 images]
[im 3/35  brain]
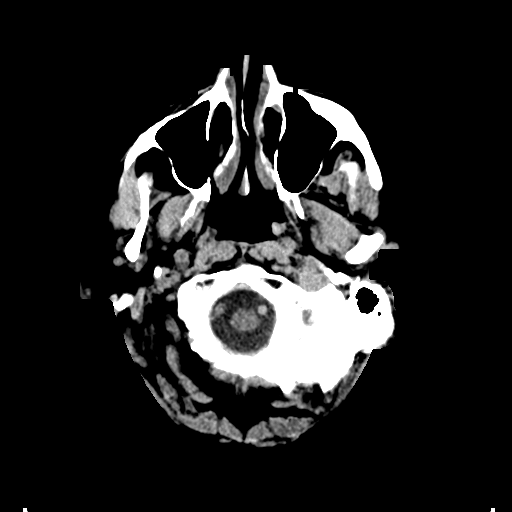
[im 3/35  bone]
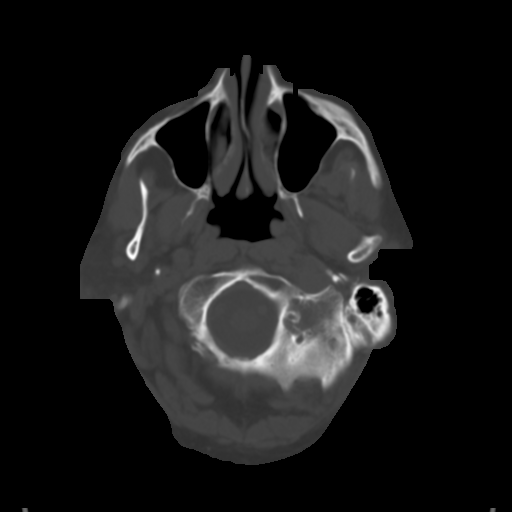
[im 6/35  brain]
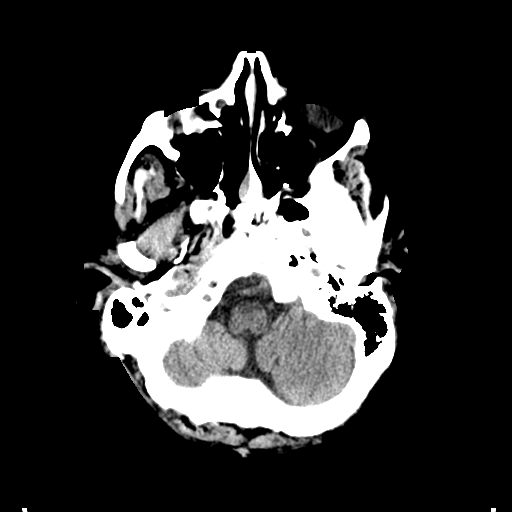
[im 10/35  brain]
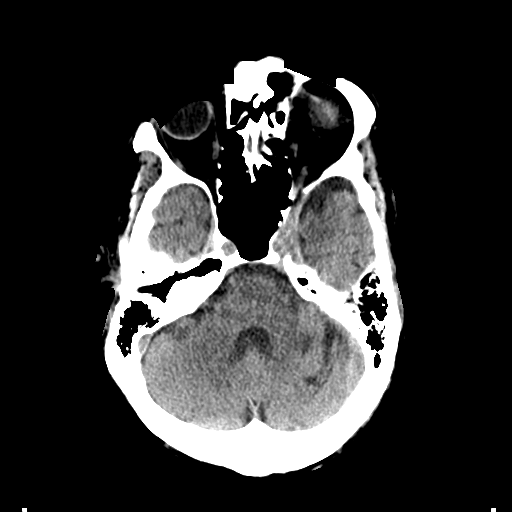
[im 13/35  brain]
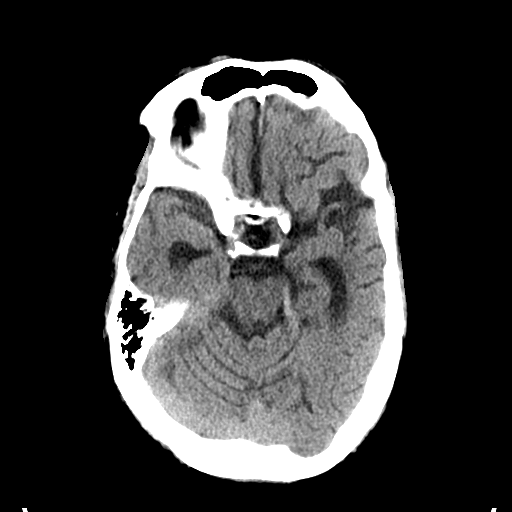
[im 18/35  brain]
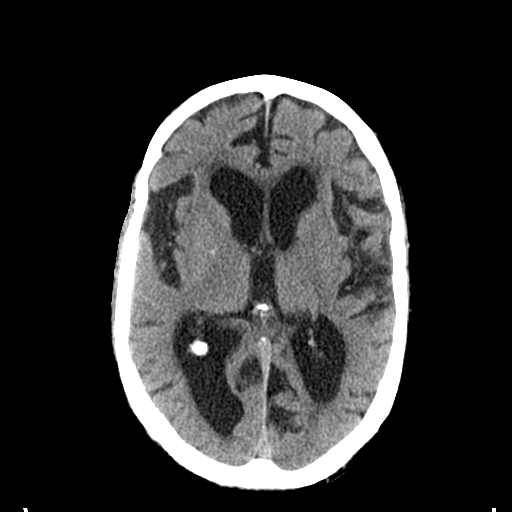
[im 18/35  bone]
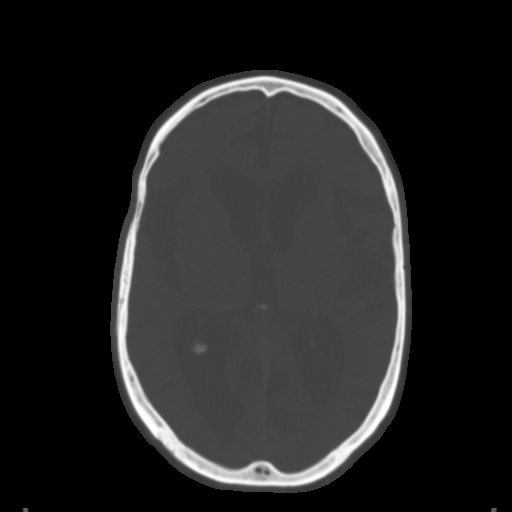
[im 22/35  brain]
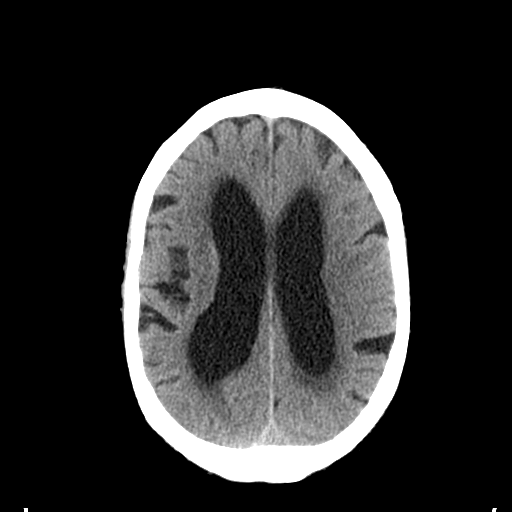
[im 25/35  brain]
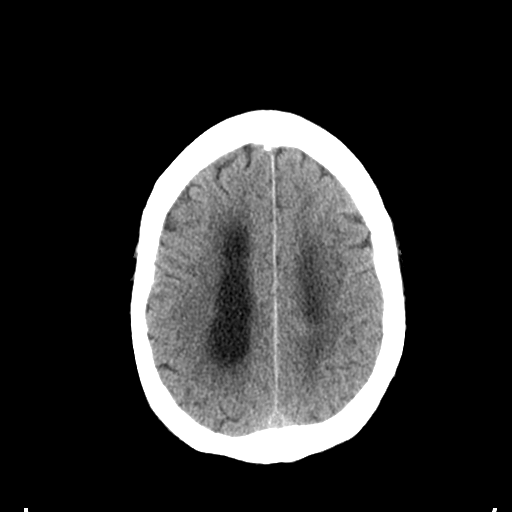
[im 29/35  brain]
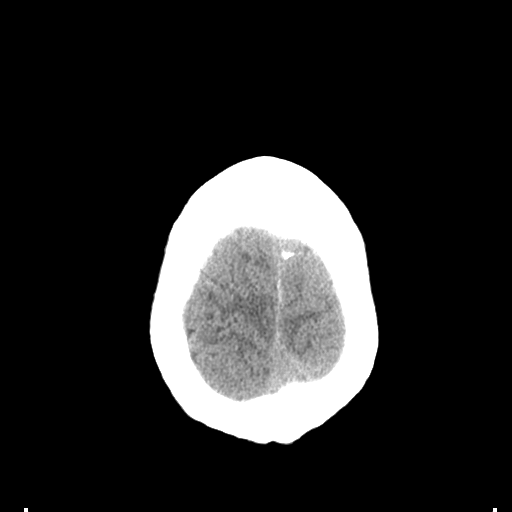
[im 32/35  brain]
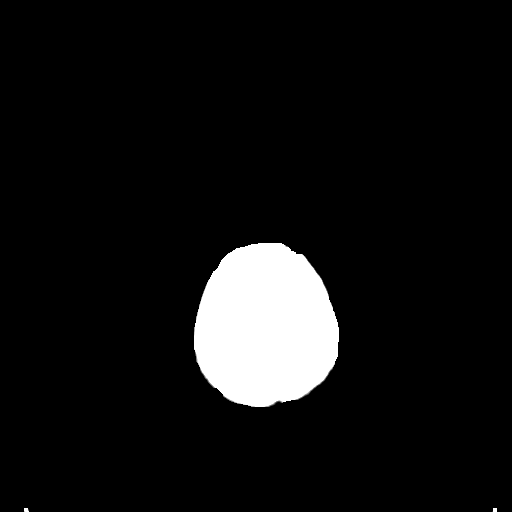
[im 32/35  bone]
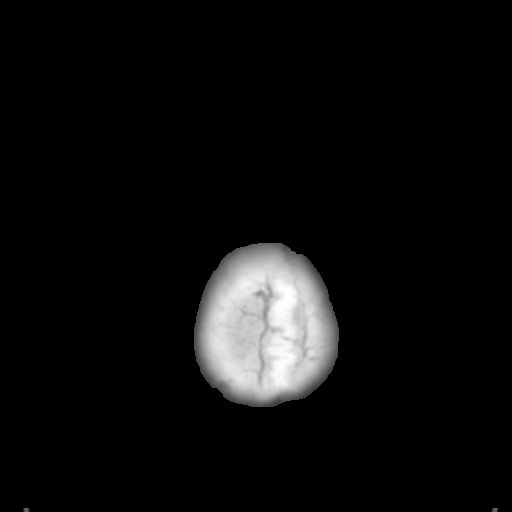

[Series 4: coronal soft tissue · coronal · 0.33mm/px · 3 of 76 slices shown]
[im 26/76  brain]
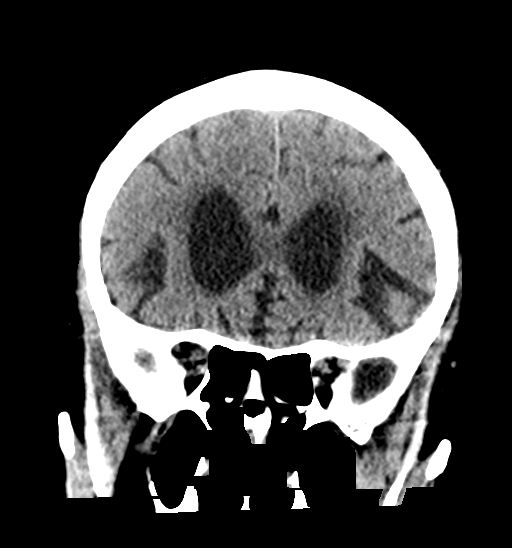
[im 34/76  brain]
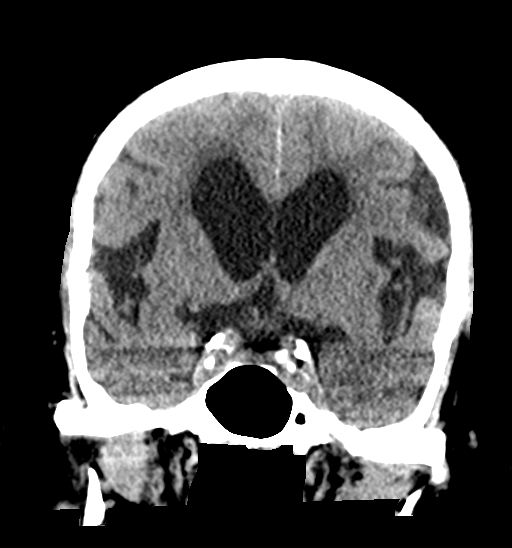
[im 42/76  brain]
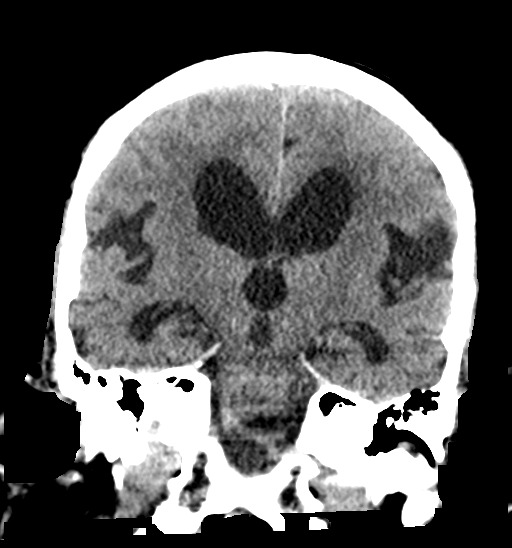

[Series 5: sagittal soft tissue · sagittal · 0.35mm/px · 3 of 54 slices shown]
[im 18/54  brain]
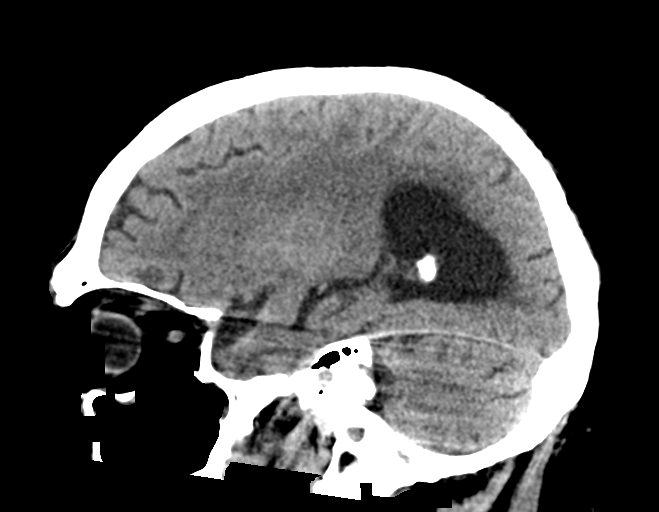
[im 27/54  brain]
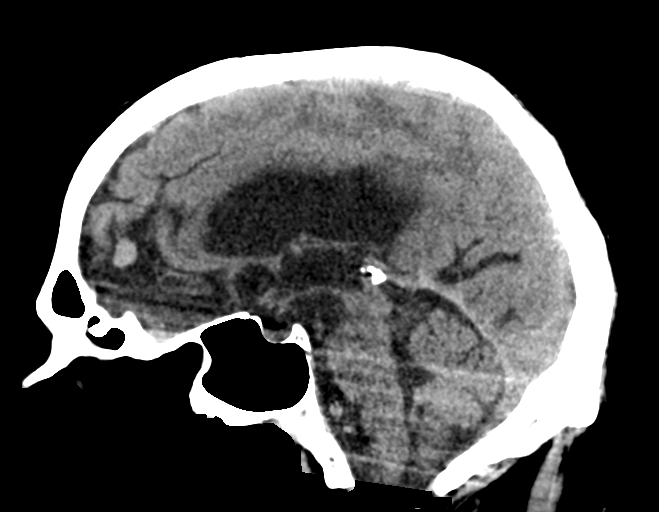
[im 36/54  brain]
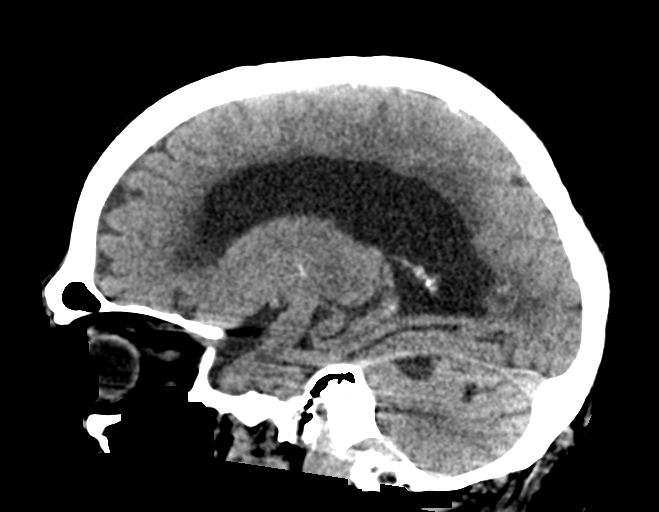

[15 of 47 positions shown; findings below may reference images not displayed]

FINDINGS: Brain: Prominence of the sulci and ventricles identified compatible
with brain atrophy. There is diffuse low attenuation throughout the
subcortical and periventricular white matter compatible with chronic
microvascular disease. Infarcts within the left cerebellar
hemisphere are stable from previous exam, image 10 of series 2.
There is volume loss from the anterior left temporal lobe, image 12
of series 2. There is a 9 mm dural based hyperdense lesion along the
right-sided the anterior falx, image 16 of series 2. This is favored
to represent a benign meningioma. And unchanged from previous exam.
No evidence for acute intracranial hemorrhage or infarct.

Vascular: No hyperdense vessel or unexpected calcification.

Skull: Normal. Negative for fracture or focal lesion.

Sinuses/Orbits: No acute finding.

Other: None.
IMPRESSION: 1. No acute intracranial abnormalities.
2. Brain atrophy and chronic small vessel ischemic disease.
3. Chronic left cerebellar hemisphere infarcts.
